# Patient Record
Sex: Female | Born: 1935 | Race: White | Hispanic: No | Marital: Married | Smoking: Former smoker
Health system: Southern US, Community
[De-identification: ages and names within clinical notes are randomized; demographics above are authoritative.]

## PROBLEM LIST (undated history)

## (undated) ENCOUNTER — Emergency Department (HOSPITAL_COMMUNITY): Admission: EM | Discharge status: Against Medical Advice | Payer: Medicare HMO

## (undated) DIAGNOSIS — I219 Acute myocardial infarction, unspecified: Secondary | ICD-10-CM

## (undated) DIAGNOSIS — Z9889 Other specified postprocedural states: Secondary | ICD-10-CM

## (undated) DIAGNOSIS — J189 Pneumonia, unspecified organism: Secondary | ICD-10-CM

## (undated) DIAGNOSIS — I10 Essential (primary) hypertension: Secondary | ICD-10-CM

## (undated) DIAGNOSIS — I951 Orthostatic hypotension: Secondary | ICD-10-CM

## (undated) DIAGNOSIS — E785 Hyperlipidemia, unspecified: Secondary | ICD-10-CM

## (undated) DIAGNOSIS — H509 Unspecified strabismus: Secondary | ICD-10-CM

## (undated) DIAGNOSIS — K551 Chronic vascular disorders of intestine: Secondary | ICD-10-CM

## (undated) DIAGNOSIS — D126 Benign neoplasm of colon, unspecified: Secondary | ICD-10-CM

## (undated) DIAGNOSIS — K317 Polyp of stomach and duodenum: Secondary | ICD-10-CM

## (undated) DIAGNOSIS — S72001A Fracture of unspecified part of neck of right femur, initial encounter for closed fracture: Secondary | ICD-10-CM

## (undated) DIAGNOSIS — D509 Iron deficiency anemia, unspecified: Secondary | ICD-10-CM

## (undated) DIAGNOSIS — K449 Diaphragmatic hernia without obstruction or gangrene: Secondary | ICD-10-CM

## (undated) DIAGNOSIS — K219 Gastro-esophageal reflux disease without esophagitis: Secondary | ICD-10-CM

## (undated) DIAGNOSIS — C3490 Malignant neoplasm of unspecified part of unspecified bronchus or lung: Secondary | ICD-10-CM

## (undated) DIAGNOSIS — H3530 Unspecified macular degeneration: Secondary | ICD-10-CM

## (undated) DIAGNOSIS — E039 Hypothyroidism, unspecified: Secondary | ICD-10-CM

## (undated) DIAGNOSIS — F32A Depression, unspecified: Secondary | ICD-10-CM

## (undated) DIAGNOSIS — J449 Chronic obstructive pulmonary disease, unspecified: Secondary | ICD-10-CM

## (undated) DIAGNOSIS — R413 Other amnesia: Secondary | ICD-10-CM

## (undated) DIAGNOSIS — G7000 Myasthenia gravis without (acute) exacerbation: Secondary | ICD-10-CM

## (undated) DIAGNOSIS — I2119 ST elevation (STEMI) myocardial infarction involving other coronary artery of inferior wall: Secondary | ICD-10-CM

## (undated) DIAGNOSIS — K2970 Gastritis, unspecified, without bleeding: Secondary | ICD-10-CM

## (undated) DIAGNOSIS — R112 Nausea with vomiting, unspecified: Secondary | ICD-10-CM

## (undated) DIAGNOSIS — M1990 Unspecified osteoarthritis, unspecified site: Secondary | ICD-10-CM

## (undated) DIAGNOSIS — I509 Heart failure, unspecified: Secondary | ICD-10-CM

## (undated) DIAGNOSIS — F329 Major depressive disorder, single episode, unspecified: Secondary | ICD-10-CM

## (undated) DIAGNOSIS — R55 Syncope and collapse: Secondary | ICD-10-CM

## (undated) DIAGNOSIS — IMO0001 Reserved for inherently not codable concepts without codable children: Secondary | ICD-10-CM

## (undated) DIAGNOSIS — K5790 Diverticulosis of intestine, part unspecified, without perforation or abscess without bleeding: Secondary | ICD-10-CM

## (undated) DIAGNOSIS — S129XXA Fracture of neck, unspecified, initial encounter: Secondary | ICD-10-CM

## (undated) DIAGNOSIS — I2510 Atherosclerotic heart disease of native coronary artery without angina pectoris: Secondary | ICD-10-CM

## (undated) DIAGNOSIS — I6529 Occlusion and stenosis of unspecified carotid artery: Secondary | ICD-10-CM

## (undated) HISTORY — DX: Polyp of stomach and duodenum: K31.7

## (undated) HISTORY — PX: OTHER SURGICAL HISTORY: SHX169

## (undated) HISTORY — DX: Fracture of unspecified part of neck of right femur, initial encounter for closed fracture: S72.001A

## (undated) HISTORY — DX: Orthostatic hypotension: I95.1

## (undated) HISTORY — DX: Benign neoplasm of colon, unspecified: D12.6

## (undated) HISTORY — DX: Occlusion and stenosis of unspecified carotid artery: I65.29

## (undated) HISTORY — DX: Diaphragmatic hernia without obstruction or gangrene: K44.9

## (undated) HISTORY — DX: Atherosclerotic heart disease of native coronary artery without angina pectoris: I25.10

## (undated) HISTORY — DX: Chronic vascular disorders of intestine: K55.1

## (undated) HISTORY — DX: Hyperlipidemia, unspecified: E78.5

## (undated) HISTORY — PX: LEG SURGERY: SHX1003

## (undated) HISTORY — DX: Myasthenia gravis without (acute) exacerbation: G70.00

## (undated) HISTORY — DX: Gastritis, unspecified, without bleeding: K29.70

## (undated) HISTORY — DX: Acute myocardial infarction, unspecified: I21.9

## (undated) HISTORY — DX: Essential (primary) hypertension: I10

## (undated) HISTORY — PX: FOOT SURGERY: SHX648

## (undated) HISTORY — PX: CAROTID ENDARTERECTOMY: SUR193

## (undated) HISTORY — PX: CATARACT EXTRACTION: SUR2

## (undated) HISTORY — DX: Hypothyroidism, unspecified: E03.9

## (undated) HISTORY — DX: Syncope and collapse: R55

## (undated) HISTORY — DX: Iron deficiency anemia, unspecified: D50.9

## (undated) HISTORY — DX: Unspecified macular degeneration: H35.30

## (undated) HISTORY — DX: Diverticulosis of intestine, part unspecified, without perforation or abscess without bleeding: K57.90

## (undated) HISTORY — DX: Fracture of neck, unspecified, initial encounter: S12.9XXA

## (undated) HISTORY — DX: Gastro-esophageal reflux disease without esophagitis: K21.9

## (undated) HISTORY — DX: Chronic obstructive pulmonary disease, unspecified: J44.9

## (undated) HISTORY — DX: Malignant neoplasm of unspecified part of unspecified bronchus or lung: C34.90

## (undated) HISTORY — DX: Other amnesia: R41.3

## (undated) HISTORY — PX: UPPER GI ENDOSCOPY: SHX6162

---

## 1968-07-17 HISTORY — PX: MIDDLE EAR SURGERY: SHX713

## 1971-07-18 HISTORY — PX: TOTAL ABDOMINAL HYSTERECTOMY: SHX209

## 1991-07-18 DIAGNOSIS — I219 Acute myocardial infarction, unspecified: Secondary | ICD-10-CM

## 1991-07-18 HISTORY — DX: Acute myocardial infarction, unspecified: I21.9

## 1991-07-18 HISTORY — PX: BALLOON ANGIOPLASTY, ARTERY: SHX564

## 1991-09-15 ENCOUNTER — Encounter: Payer: Self-pay | Admitting: Gastroenterology

## 1991-09-15 DIAGNOSIS — K2970 Gastritis, unspecified, without bleeding: Secondary | ICD-10-CM

## 1991-09-15 DIAGNOSIS — K449 Diaphragmatic hernia without obstruction or gangrene: Secondary | ICD-10-CM

## 1991-09-15 DIAGNOSIS — K219 Gastro-esophageal reflux disease without esophagitis: Secondary | ICD-10-CM

## 1991-09-15 HISTORY — DX: Gastro-esophageal reflux disease without esophagitis: K21.9

## 1991-09-15 HISTORY — DX: Gastritis, unspecified, without bleeding: K29.70

## 1991-09-15 HISTORY — DX: Diaphragmatic hernia without obstruction or gangrene: K44.9

## 1994-07-17 HISTORY — PX: CARDIAC CATHETERIZATION: SHX172

## 1996-07-17 DIAGNOSIS — R55 Syncope and collapse: Secondary | ICD-10-CM

## 1996-07-17 HISTORY — DX: Syncope and collapse: R55

## 1998-06-21 ENCOUNTER — Ambulatory Visit (HOSPITAL_COMMUNITY): Admission: RE | Admit: 1998-06-21 | Discharge: 1998-06-21 | Payer: Self-pay | Admitting: Family Medicine

## 1998-06-21 ENCOUNTER — Encounter: Payer: Self-pay | Admitting: Family Medicine

## 1998-09-13 ENCOUNTER — Other Ambulatory Visit: Admission: RE | Admit: 1998-09-13 | Discharge: 1998-09-13 | Payer: Self-pay | Admitting: *Deleted

## 1998-09-21 ENCOUNTER — Ambulatory Visit (HOSPITAL_COMMUNITY): Admission: RE | Admit: 1998-09-21 | Discharge: 1998-09-21 | Payer: Self-pay | Admitting: *Deleted

## 1998-09-21 ENCOUNTER — Encounter: Payer: Self-pay | Admitting: *Deleted

## 1999-08-10 ENCOUNTER — Other Ambulatory Visit: Admission: RE | Admit: 1999-08-10 | Discharge: 1999-08-10 | Payer: Self-pay | Admitting: *Deleted

## 1999-08-18 ENCOUNTER — Ambulatory Visit (HOSPITAL_COMMUNITY): Admission: RE | Admit: 1999-08-18 | Discharge: 1999-08-18 | Payer: Self-pay | Admitting: Otolaryngology

## 1999-08-18 ENCOUNTER — Encounter: Payer: Self-pay | Admitting: Otolaryngology

## 2000-06-04 ENCOUNTER — Other Ambulatory Visit: Admission: RE | Admit: 2000-06-04 | Discharge: 2000-06-04 | Payer: Self-pay | Admitting: Gastroenterology

## 2000-06-04 ENCOUNTER — Encounter (INDEPENDENT_AMBULATORY_CARE_PROVIDER_SITE_OTHER): Payer: Self-pay

## 2000-07-31 ENCOUNTER — Other Ambulatory Visit: Admission: RE | Admit: 2000-07-31 | Discharge: 2000-07-31 | Payer: Self-pay | Admitting: *Deleted

## 2001-07-17 HISTORY — PX: OTHER SURGICAL HISTORY: SHX169

## 2002-02-18 ENCOUNTER — Inpatient Hospital Stay (HOSPITAL_COMMUNITY): Admission: EM | Admit: 2002-02-18 | Discharge: 2002-02-20 | Payer: Self-pay

## 2002-03-24 ENCOUNTER — Encounter: Payer: Self-pay | Admitting: Emergency Medicine

## 2002-03-24 ENCOUNTER — Emergency Department (HOSPITAL_COMMUNITY): Admission: EM | Admit: 2002-03-24 | Discharge: 2002-03-24 | Payer: Self-pay | Admitting: Emergency Medicine

## 2003-05-05 ENCOUNTER — Other Ambulatory Visit: Admission: RE | Admit: 2003-05-05 | Discharge: 2003-05-05 | Payer: Self-pay | Admitting: Family Medicine

## 2003-05-06 ENCOUNTER — Encounter: Admission: RE | Admit: 2003-05-06 | Discharge: 2003-05-06 | Payer: Self-pay | Admitting: Internal Medicine

## 2003-05-06 ENCOUNTER — Encounter: Payer: Self-pay | Admitting: Internal Medicine

## 2003-10-23 ENCOUNTER — Encounter: Admission: RE | Admit: 2003-10-23 | Discharge: 2003-10-23 | Payer: Self-pay | Admitting: Internal Medicine

## 2004-06-21 ENCOUNTER — Ambulatory Visit: Payer: Self-pay | Admitting: Internal Medicine

## 2004-07-17 HISTORY — PX: COLONOSCOPY W/ POLYPECTOMY: SHX1380

## 2004-08-11 ENCOUNTER — Ambulatory Visit: Payer: Self-pay | Admitting: Family Medicine

## 2004-08-19 ENCOUNTER — Ambulatory Visit: Payer: Self-pay | Admitting: Internal Medicine

## 2004-08-22 ENCOUNTER — Ambulatory Visit: Payer: Self-pay | Admitting: Internal Medicine

## 2004-08-24 ENCOUNTER — Encounter: Admission: RE | Admit: 2004-08-24 | Discharge: 2004-08-24 | Payer: Self-pay | Admitting: Internal Medicine

## 2004-10-07 ENCOUNTER — Encounter: Admission: RE | Admit: 2004-10-07 | Discharge: 2004-10-07 | Payer: Self-pay | Admitting: Neurology

## 2004-11-04 ENCOUNTER — Ambulatory Visit: Payer: Self-pay | Admitting: Gastroenterology

## 2004-11-16 ENCOUNTER — Ambulatory Visit: Payer: Self-pay | Admitting: Internal Medicine

## 2004-11-21 ENCOUNTER — Ambulatory Visit: Payer: Self-pay | Admitting: Gastroenterology

## 2004-11-21 DIAGNOSIS — D126 Benign neoplasm of colon, unspecified: Secondary | ICD-10-CM | POA: Insufficient documentation

## 2005-01-19 ENCOUNTER — Ambulatory Visit: Payer: Self-pay | Admitting: Internal Medicine

## 2005-05-05 ENCOUNTER — Ambulatory Visit: Payer: Self-pay | Admitting: Internal Medicine

## 2005-06-19 ENCOUNTER — Ambulatory Visit: Payer: Self-pay | Admitting: Internal Medicine

## 2005-07-04 ENCOUNTER — Ambulatory Visit: Payer: Self-pay | Admitting: Internal Medicine

## 2005-10-31 ENCOUNTER — Ambulatory Visit: Payer: Self-pay | Admitting: Internal Medicine

## 2005-11-07 ENCOUNTER — Ambulatory Visit: Payer: Self-pay | Admitting: Internal Medicine

## 2005-11-21 ENCOUNTER — Ambulatory Visit: Payer: Self-pay | Admitting: Internal Medicine

## 2005-12-08 ENCOUNTER — Ambulatory Visit: Payer: Self-pay | Admitting: Internal Medicine

## 2006-02-14 ENCOUNTER — Ambulatory Visit: Payer: Self-pay | Admitting: Internal Medicine

## 2006-02-15 ENCOUNTER — Ambulatory Visit: Payer: Self-pay | Admitting: Internal Medicine

## 2006-03-20 ENCOUNTER — Encounter: Payer: Self-pay | Admitting: Family Medicine

## 2006-03-20 ENCOUNTER — Other Ambulatory Visit: Admission: RE | Admit: 2006-03-20 | Discharge: 2006-03-20 | Payer: Self-pay | Admitting: Family Medicine

## 2006-03-20 ENCOUNTER — Ambulatory Visit: Payer: Self-pay | Admitting: Family Medicine

## 2006-03-23 ENCOUNTER — Encounter: Admission: RE | Admit: 2006-03-23 | Discharge: 2006-03-23 | Payer: Self-pay | Admitting: Family Medicine

## 2006-03-30 ENCOUNTER — Ambulatory Visit: Payer: Self-pay | Admitting: Internal Medicine

## 2006-06-01 ENCOUNTER — Encounter: Admission: RE | Admit: 2006-06-01 | Discharge: 2006-06-01 | Payer: Self-pay | Admitting: Neurosurgery

## 2006-09-11 ENCOUNTER — Ambulatory Visit: Payer: Self-pay | Admitting: Internal Medicine

## 2006-09-11 LAB — CONVERTED CEMR LAB
BUN: 14 mg/dL (ref 6–23)
Basophils Absolute: 0.3 10*3/uL — ABNORMAL HIGH (ref 0.0–0.1)
Basophils Relative: 3 % — ABNORMAL HIGH (ref 0.0–1.0)
Creatinine, Ser: 1 mg/dL (ref 0.4–1.2)
Creatinine,U: 28.6 mg/dL
Eosinophils Absolute: 0 10*3/uL (ref 0.0–0.6)
Eosinophils Relative: 0.1 % (ref 0.0–5.0)
HCT: 35.7 % — ABNORMAL LOW (ref 36.0–46.0)
Hemoglobin: 12.2 g/dL (ref 12.0–15.0)
Hgb A1c MFr Bld: 6.2 % — ABNORMAL HIGH (ref 4.6–6.0)
Lymphocytes Relative: 15.3 % (ref 12.0–46.0)
MCHC: 34.2 g/dL (ref 30.0–36.0)
MCV: 84.9 fL (ref 78.0–100.0)
Microalb, Ur: 0.2 mg/dL (ref 0.0–1.9)
Monocytes Absolute: 0.1 10*3/uL — ABNORMAL LOW (ref 0.2–0.7)
Monocytes Relative: 1.2 % — ABNORMAL LOW (ref 3.0–11.0)
Neutro Abs: 7.1 10*3/uL (ref 1.4–7.7)
Neutrophils Relative %: 80.4 % — ABNORMAL HIGH (ref 43.0–77.0)
Platelets: 237 10*3/uL (ref 150–400)
Potassium: 4.4 meq/L (ref 3.5–5.1)
RBC: 4.2 M/uL (ref 3.87–5.11)
RDW: 13.4 % (ref 11.5–14.6)
TSH: 0.76 microintl units/mL (ref 0.35–5.50)
WBC: 8.9 10*3/uL (ref 4.5–10.5)

## 2006-09-28 ENCOUNTER — Ambulatory Visit: Payer: Self-pay | Admitting: Internal Medicine

## 2006-11-29 ENCOUNTER — Encounter: Payer: Self-pay | Admitting: Internal Medicine

## 2007-01-11 ENCOUNTER — Telehealth (INDEPENDENT_AMBULATORY_CARE_PROVIDER_SITE_OTHER): Payer: Self-pay | Admitting: *Deleted

## 2007-01-11 ENCOUNTER — Encounter: Payer: Self-pay | Admitting: Internal Medicine

## 2007-01-11 ENCOUNTER — Encounter: Admission: RE | Admit: 2007-01-11 | Discharge: 2007-01-11 | Payer: Self-pay | Admitting: Family Medicine

## 2007-01-11 ENCOUNTER — Ambulatory Visit: Payer: Self-pay | Admitting: Family Medicine

## 2007-01-14 ENCOUNTER — Encounter (INDEPENDENT_AMBULATORY_CARE_PROVIDER_SITE_OTHER): Payer: Self-pay | Admitting: *Deleted

## 2007-01-16 ENCOUNTER — Telehealth (INDEPENDENT_AMBULATORY_CARE_PROVIDER_SITE_OTHER): Payer: Self-pay | Admitting: *Deleted

## 2007-01-29 ENCOUNTER — Ambulatory Visit: Payer: Self-pay | Admitting: Internal Medicine

## 2007-01-29 LAB — CONVERTED CEMR LAB
ALT: 16 units/L (ref 0–35)
AST: 24 units/L (ref 0–37)
BUN: 9 mg/dL (ref 6–23)
Cholesterol: 194 mg/dL (ref 0–200)
Creatinine, Ser: 0.8 mg/dL (ref 0.4–1.2)
HDL: 74.4 mg/dL (ref >39.0)
Hgb A1c MFr Bld: 6.1 % — ABNORMAL HIGH (ref 4.6–6.0)
LDL Cholesterol: 103 mg/dL — ABNORMAL HIGH (ref 0–99)
Potassium: 4.5 meq/L (ref 3.5–5.1)
TSH: 7.02 microintl units/mL — ABNORMAL HIGH (ref 0.35–5.50)
Total CHOL/HDL Ratio: 2.6
Triglycerides: 83 mg/dL (ref 0–149)
VLDL: 17 mg/dL (ref 0–40)

## 2007-02-06 ENCOUNTER — Ambulatory Visit: Payer: Self-pay | Admitting: Internal Medicine

## 2007-02-06 DIAGNOSIS — I10 Essential (primary) hypertension: Secondary | ICD-10-CM | POA: Insufficient documentation

## 2007-02-06 DIAGNOSIS — F17200 Nicotine dependence, unspecified, uncomplicated: Secondary | ICD-10-CM | POA: Insufficient documentation

## 2007-02-06 LAB — CONVERTED CEMR LAB
Cholesterol, target level: 200 mg/dL
HDL goal, serum: 40 mg/dL
LDL Goal: 100 mg/dL

## 2007-02-14 ENCOUNTER — Encounter: Payer: Self-pay | Admitting: Internal Medicine

## 2007-02-15 ENCOUNTER — Encounter: Payer: Self-pay | Admitting: Internal Medicine

## 2007-04-22 ENCOUNTER — Telehealth (INDEPENDENT_AMBULATORY_CARE_PROVIDER_SITE_OTHER): Payer: Self-pay | Admitting: *Deleted

## 2007-04-23 ENCOUNTER — Ambulatory Visit: Payer: Self-pay | Admitting: Internal Medicine

## 2007-04-28 ENCOUNTER — Emergency Department (HOSPITAL_COMMUNITY): Admission: EM | Admit: 2007-04-28 | Discharge: 2007-04-28 | Payer: Self-pay | Admitting: Emergency Medicine

## 2007-05-29 ENCOUNTER — Telehealth (INDEPENDENT_AMBULATORY_CARE_PROVIDER_SITE_OTHER): Payer: Self-pay | Admitting: *Deleted

## 2007-08-13 ENCOUNTER — Ambulatory Visit: Payer: Self-pay | Admitting: Internal Medicine

## 2007-08-18 LAB — CONVERTED CEMR LAB
ALT: 15 units/L (ref 0–35)
AST: 22 units/L (ref 0–37)
BUN: 14 mg/dL (ref 6–23)
Cholesterol: 203 mg/dL (ref 0–200)
Creatinine, Ser: 1 mg/dL (ref 0.4–1.2)
Direct LDL: 98.7 mg/dL
HDL: 86.3 mg/dL (ref >39.0)
Hgb A1c MFr Bld: 6.1 % — ABNORMAL HIGH (ref 4.6–6.0)
Total CHOL/HDL Ratio: 2.4
Triglycerides: 84 mg/dL (ref 0–149)
VLDL: 17 mg/dL (ref 0–40)

## 2007-08-19 ENCOUNTER — Ambulatory Visit: Payer: Self-pay | Admitting: Internal Medicine

## 2007-08-19 DIAGNOSIS — J439 Emphysema, unspecified: Secondary | ICD-10-CM | POA: Insufficient documentation

## 2007-08-19 DIAGNOSIS — E782 Mixed hyperlipidemia: Secondary | ICD-10-CM | POA: Insufficient documentation

## 2007-08-19 DIAGNOSIS — R7309 Other abnormal glucose: Secondary | ICD-10-CM | POA: Insufficient documentation

## 2007-08-21 ENCOUNTER — Encounter (INDEPENDENT_AMBULATORY_CARE_PROVIDER_SITE_OTHER): Payer: Self-pay | Admitting: *Deleted

## 2007-08-26 ENCOUNTER — Ambulatory Visit: Payer: Self-pay | Admitting: Cardiology

## 2007-09-06 ENCOUNTER — Ambulatory Visit: Payer: Self-pay

## 2007-10-09 ENCOUNTER — Encounter: Payer: Self-pay | Admitting: Internal Medicine

## 2007-10-23 ENCOUNTER — Telehealth (INDEPENDENT_AMBULATORY_CARE_PROVIDER_SITE_OTHER): Payer: Self-pay | Admitting: *Deleted

## 2007-11-06 ENCOUNTER — Encounter: Admission: RE | Admit: 2007-11-06 | Discharge: 2007-11-06 | Payer: Self-pay | Admitting: Neurology

## 2007-11-15 DIAGNOSIS — K317 Polyp of stomach and duodenum: Secondary | ICD-10-CM

## 2007-11-15 HISTORY — DX: Polyp of stomach and duodenum: K31.7

## 2007-12-02 ENCOUNTER — Telehealth (INDEPENDENT_AMBULATORY_CARE_PROVIDER_SITE_OTHER): Payer: Self-pay | Admitting: *Deleted

## 2007-12-02 ENCOUNTER — Emergency Department (HOSPITAL_COMMUNITY): Admission: EM | Admit: 2007-12-02 | Discharge: 2007-12-02 | Payer: Self-pay | Admitting: Emergency Medicine

## 2007-12-04 ENCOUNTER — Ambulatory Visit: Payer: Self-pay | Admitting: Internal Medicine

## 2007-12-04 LAB — CONVERTED CEMR LAB
Basophils Absolute: 0.1 10*3/uL (ref 0.0–0.1)
Basophils Relative: 0.8 % (ref 0.0–1.0)
Eosinophils Absolute: 0.1 10*3/uL (ref 0.0–0.7)
Eosinophils Relative: 1 % (ref 0.0–5.0)
Folate: 14.3 ng/mL
HCT: 33.7 % — ABNORMAL LOW (ref 36.0–46.0)
Hemoglobin: 11.1 g/dL — ABNORMAL LOW (ref 12.0–15.0)
Hgb A1c MFr Bld: 5.9 % (ref 4.6–6.0)
Iron: 27 ug/dL — ABNORMAL LOW (ref 42–145)
Lymphocytes Relative: 14.6 % (ref 12.0–46.0)
MCHC: 32.9 g/dL (ref 30.0–36.0)
MCV: 79.9 fL (ref 78.0–100.0)
Monocytes Absolute: 0.3 10*3/uL (ref 0.1–1.0)
Monocytes Relative: 4.7 % (ref 3.0–12.0)
Neutro Abs: 5.6 10*3/uL (ref 1.4–7.7)
Neutrophils Relative %: 78.9 % — ABNORMAL HIGH (ref 43.0–77.0)
Platelets: 225 10*3/uL (ref 150–400)
RBC: 4.22 M/uL (ref 3.87–5.11)
RDW: 14.6 % (ref 11.5–14.6)
Saturation Ratios: 7 % — ABNORMAL LOW (ref 20.0–50.0)
Transferrin: 275.4 mg/dL (ref >=212.0)
Vitamin B-12: 1113 pg/mL — ABNORMAL HIGH (ref 211–911)
WBC: 7.1 10*3/uL (ref 4.5–10.5)

## 2007-12-05 ENCOUNTER — Encounter: Payer: Self-pay | Admitting: Internal Medicine

## 2007-12-05 DIAGNOSIS — K5730 Diverticulosis of large intestine without perforation or abscess without bleeding: Secondary | ICD-10-CM | POA: Insufficient documentation

## 2007-12-06 ENCOUNTER — Encounter (INDEPENDENT_AMBULATORY_CARE_PROVIDER_SITE_OTHER): Payer: Self-pay | Admitting: *Deleted

## 2007-12-06 ENCOUNTER — Ambulatory Visit: Payer: Self-pay | Admitting: Gastroenterology

## 2007-12-06 LAB — CONVERTED CEMR LAB
CA 125: 9.8 units/mL (ref 0.0–30.2)
CRP, High Sensitivity: <1 — ABNORMAL LOW (ref 0.00–5.00)

## 2007-12-07 ENCOUNTER — Telehealth: Payer: Self-pay | Admitting: Internal Medicine

## 2007-12-10 ENCOUNTER — Ambulatory Visit: Payer: Self-pay | Admitting: Gastroenterology

## 2007-12-10 LAB — CONVERTED CEMR LAB
CRP, High Sensitivity: <1 — ABNORMAL LOW (ref 0.00–5.00)
Ferritin: 5.8 ng/mL — ABNORMAL LOW (ref 10.0–291.0)
Folate: 15.1 ng/mL
Iron: 26 ug/dL — ABNORMAL LOW (ref 42–145)
Saturation Ratios: 6.8 % — ABNORMAL LOW (ref 20.0–50.0)
Sed Rate: 17 mm/hr (ref 0–22)
Transferrin: 273.6 mg/dL (ref >=212.0)
Vitamin B-12: 968 pg/mL — ABNORMAL HIGH (ref 211–911)

## 2007-12-11 ENCOUNTER — Ambulatory Visit: Payer: Self-pay | Admitting: Gastroenterology

## 2007-12-11 ENCOUNTER — Encounter: Payer: Self-pay | Admitting: Gastroenterology

## 2007-12-11 LAB — HM COLONOSCOPY

## 2007-12-12 ENCOUNTER — Telehealth: Payer: Self-pay | Admitting: Gastroenterology

## 2007-12-13 ENCOUNTER — Encounter: Payer: Self-pay | Admitting: Gastroenterology

## 2007-12-17 ENCOUNTER — Telehealth: Payer: Self-pay | Admitting: Gastroenterology

## 2008-01-21 ENCOUNTER — Telehealth (INDEPENDENT_AMBULATORY_CARE_PROVIDER_SITE_OTHER): Payer: Self-pay | Admitting: *Deleted

## 2008-02-04 ENCOUNTER — Encounter: Payer: Self-pay | Admitting: Internal Medicine

## 2008-02-24 ENCOUNTER — Telehealth (INDEPENDENT_AMBULATORY_CARE_PROVIDER_SITE_OTHER): Payer: Self-pay | Admitting: *Deleted

## 2008-03-03 ENCOUNTER — Telehealth (INDEPENDENT_AMBULATORY_CARE_PROVIDER_SITE_OTHER): Payer: Self-pay | Admitting: *Deleted

## 2008-03-09 ENCOUNTER — Telehealth (INDEPENDENT_AMBULATORY_CARE_PROVIDER_SITE_OTHER): Payer: Self-pay | Admitting: *Deleted

## 2008-03-10 ENCOUNTER — Telehealth (INDEPENDENT_AMBULATORY_CARE_PROVIDER_SITE_OTHER): Payer: Self-pay | Admitting: *Deleted

## 2008-03-13 ENCOUNTER — Ambulatory Visit: Payer: Self-pay | Admitting: Internal Medicine

## 2008-03-13 ENCOUNTER — Encounter: Payer: Self-pay | Admitting: Internal Medicine

## 2008-03-13 DIAGNOSIS — M25569 Pain in unspecified knee: Secondary | ICD-10-CM | POA: Insufficient documentation

## 2008-03-16 ENCOUNTER — Telehealth: Payer: Self-pay | Admitting: Internal Medicine

## 2008-04-13 ENCOUNTER — Encounter: Payer: Self-pay | Admitting: Internal Medicine

## 2008-04-13 ENCOUNTER — Telehealth (INDEPENDENT_AMBULATORY_CARE_PROVIDER_SITE_OTHER): Payer: Self-pay | Admitting: *Deleted

## 2008-04-15 ENCOUNTER — Encounter: Admission: RE | Admit: 2008-04-15 | Discharge: 2008-04-15 | Payer: Self-pay | Admitting: Neurology

## 2008-04-17 ENCOUNTER — Ambulatory Visit: Payer: Self-pay | Admitting: Family Medicine

## 2008-04-17 ENCOUNTER — Encounter: Payer: Self-pay | Admitting: Internal Medicine

## 2008-04-20 ENCOUNTER — Encounter: Admission: RE | Admit: 2008-04-20 | Discharge: 2008-04-20 | Payer: Self-pay | Admitting: Neurology

## 2008-04-23 ENCOUNTER — Encounter: Payer: Self-pay | Admitting: Internal Medicine

## 2008-05-07 ENCOUNTER — Telehealth (INDEPENDENT_AMBULATORY_CARE_PROVIDER_SITE_OTHER): Payer: Self-pay | Admitting: *Deleted

## 2008-05-12 ENCOUNTER — Encounter (INDEPENDENT_AMBULATORY_CARE_PROVIDER_SITE_OTHER): Payer: Self-pay | Admitting: *Deleted

## 2008-05-12 ENCOUNTER — Ambulatory Visit: Payer: Self-pay | Admitting: Internal Medicine

## 2008-05-12 DIAGNOSIS — M8580 Other specified disorders of bone density and structure, unspecified site: Secondary | ICD-10-CM | POA: Insufficient documentation

## 2008-05-13 LAB — CONVERTED CEMR LAB: Vit D, 1,25-Dihydroxy: 10 — ABNORMAL LOW (ref 30–89)

## 2008-05-14 ENCOUNTER — Encounter (INDEPENDENT_AMBULATORY_CARE_PROVIDER_SITE_OTHER): Payer: Self-pay | Admitting: *Deleted

## 2008-05-14 ENCOUNTER — Telehealth (INDEPENDENT_AMBULATORY_CARE_PROVIDER_SITE_OTHER): Payer: Self-pay | Admitting: *Deleted

## 2008-05-26 ENCOUNTER — Ambulatory Visit: Payer: Self-pay | Admitting: Internal Medicine

## 2008-05-26 DIAGNOSIS — E559 Vitamin D deficiency, unspecified: Secondary | ICD-10-CM | POA: Insufficient documentation

## 2008-05-26 DIAGNOSIS — E039 Hypothyroidism, unspecified: Secondary | ICD-10-CM | POA: Insufficient documentation

## 2008-06-05 ENCOUNTER — Emergency Department (HOSPITAL_COMMUNITY): Admission: EM | Admit: 2008-06-05 | Discharge: 2008-06-05 | Payer: Self-pay | Admitting: Emergency Medicine

## 2008-06-16 ENCOUNTER — Encounter: Payer: Self-pay | Admitting: Internal Medicine

## 2008-06-17 ENCOUNTER — Encounter (INDEPENDENT_AMBULATORY_CARE_PROVIDER_SITE_OTHER): Payer: Self-pay | Admitting: *Deleted

## 2008-06-17 LAB — CONVERTED CEMR LAB
BUN: 10 mg/dL (ref 6–23)
Creatinine, Ser: 0.7 mg/dL (ref 0.4–1.2)
Hgb A1c MFr Bld: 5.9 % (ref 4.6–6.0)
Potassium: 4.5 meq/L (ref 3.5–5.1)
TSH: 0.55 microintl units/mL (ref 0.35–5.50)

## 2008-06-18 ENCOUNTER — Telehealth (INDEPENDENT_AMBULATORY_CARE_PROVIDER_SITE_OTHER): Payer: Self-pay | Admitting: *Deleted

## 2008-07-29 ENCOUNTER — Telehealth (INDEPENDENT_AMBULATORY_CARE_PROVIDER_SITE_OTHER): Payer: Self-pay | Admitting: *Deleted

## 2008-08-31 ENCOUNTER — Ambulatory Visit: Payer: Self-pay | Admitting: Cardiology

## 2008-09-05 DIAGNOSIS — I2510 Atherosclerotic heart disease of native coronary artery without angina pectoris: Secondary | ICD-10-CM | POA: Insufficient documentation

## 2008-09-07 ENCOUNTER — Ambulatory Visit: Payer: Self-pay | Admitting: Internal Medicine

## 2008-09-07 LAB — CONVERTED CEMR LAB
ALT: 13 units/L (ref 0–35)
AST: 23 units/L (ref 0–37)
Albumin: 3.5 g/dL (ref 3.5–5.2)
Alkaline Phosphatase: 57 units/L (ref 39–117)
BUN: 12 mg/dL (ref 6–23)
Basophils Absolute: 0.1 10*3/uL (ref 0.0–0.1)
Basophils Relative: 1.5 % (ref 0.0–3.0)
Bilirubin, Direct: 0.1 mg/dL (ref 0.0–0.3)
CO2: 30 meq/L (ref 19–32)
Calcium: 8.9 mg/dL (ref 8.4–10.5)
Chloride: 105 meq/L (ref 96–112)
Cholesterol: 179 mg/dL (ref 0–200)
Creatinine, Ser: 0.8 mg/dL (ref 0.4–1.2)
Eosinophils Absolute: 0.3 10*3/uL (ref 0.0–0.7)
Eosinophils Relative: 6 % — ABNORMAL HIGH (ref 0.0–5.0)
GFR calc Af Amer: 90 mL/min
GFR calc non Af Amer: 75 mL/min
Glucose, Bld: 85 mg/dL (ref 70–99)
HCT: 32.5 % — ABNORMAL LOW (ref 36.0–46.0)
HDL: 69.9 mg/dL (ref >39.0)
Hemoglobin: 10.4 g/dL — ABNORMAL LOW (ref 12.0–15.0)
LDL Cholesterol: 94 mg/dL (ref 0–99)
Lymphocytes Relative: 37.7 % (ref 12.0–46.0)
MCHC: 32.1 g/dL (ref 30.0–36.0)
MCV: 76.8 fL — ABNORMAL LOW (ref 78.0–100.0)
Monocytes Absolute: 0.5 10*3/uL (ref 0.1–1.0)
Monocytes Relative: 11 % (ref 3.0–12.0)
Neutro Abs: 2 10*3/uL (ref 1.4–7.7)
Neutrophils Relative %: 43.8 % (ref 43.0–77.0)
Platelets: 190 10*3/uL (ref 150–400)
Potassium: 4.3 meq/L (ref 3.5–5.1)
RBC: 4.23 M/uL (ref 3.87–5.11)
RDW: 15 % — ABNORMAL HIGH (ref 11.5–14.6)
Sodium: 142 meq/L (ref 135–145)
Total Bilirubin: 0.7 mg/dL (ref 0.3–1.2)
Total CHOL/HDL Ratio: 2.6
Total Protein: 6.5 g/dL (ref 6.0–8.3)
Triglycerides: 78 mg/dL (ref 0–149)
VLDL: 16 mg/dL (ref 0–40)
Vit D, 25-Hydroxy: 50 ng/mL (ref 30–89)
WBC: 4.7 10*3/uL (ref 4.5–10.5)

## 2008-09-11 ENCOUNTER — Ambulatory Visit: Payer: Self-pay | Admitting: Internal Medicine

## 2008-09-11 ENCOUNTER — Encounter (INDEPENDENT_AMBULATORY_CARE_PROVIDER_SITE_OTHER): Payer: Self-pay | Admitting: *Deleted

## 2008-09-11 DIAGNOSIS — D509 Iron deficiency anemia, unspecified: Secondary | ICD-10-CM | POA: Insufficient documentation

## 2008-09-11 LAB — CONVERTED CEMR LAB
OCCULT 1: NEGATIVE
OCCULT 2: NEGATIVE
OCCULT 3: NEGATIVE

## 2008-09-14 LAB — CONVERTED CEMR LAB
Hgb A1c MFr Bld: 6 % (ref 4.6–6.0)
Iron: 24 ug/dL — ABNORMAL LOW (ref 42–145)
Saturation Ratios: 5.9 % — ABNORMAL LOW (ref 20.0–50.0)
Transferrin: 292.6 mg/dL (ref 212.0–360.0)

## 2008-09-16 ENCOUNTER — Encounter (INDEPENDENT_AMBULATORY_CARE_PROVIDER_SITE_OTHER): Payer: Self-pay | Admitting: *Deleted

## 2008-09-16 ENCOUNTER — Telehealth (INDEPENDENT_AMBULATORY_CARE_PROVIDER_SITE_OTHER): Payer: Self-pay | Admitting: *Deleted

## 2008-09-21 ENCOUNTER — Telehealth: Payer: Self-pay | Admitting: Internal Medicine

## 2008-09-25 ENCOUNTER — Encounter: Payer: Self-pay | Admitting: Internal Medicine

## 2008-11-18 ENCOUNTER — Telehealth (INDEPENDENT_AMBULATORY_CARE_PROVIDER_SITE_OTHER): Payer: Self-pay | Admitting: *Deleted

## 2008-11-19 ENCOUNTER — Encounter: Payer: Self-pay | Admitting: Internal Medicine

## 2009-01-07 ENCOUNTER — Inpatient Hospital Stay (HOSPITAL_COMMUNITY): Admission: EM | Admit: 2009-01-07 | Discharge: 2009-01-10 | Payer: Self-pay | Admitting: Emergency Medicine

## 2009-01-07 ENCOUNTER — Ambulatory Visit: Payer: Self-pay | Admitting: Internal Medicine

## 2009-01-07 ENCOUNTER — Ambulatory Visit: Payer: Self-pay | Admitting: Cardiovascular Disease

## 2009-01-08 ENCOUNTER — Encounter: Payer: Self-pay | Admitting: Cardiovascular Disease

## 2009-01-14 ENCOUNTER — Encounter: Payer: Self-pay | Admitting: Internal Medicine

## 2009-01-29 ENCOUNTER — Telehealth: Payer: Self-pay | Admitting: Cardiology

## 2009-02-08 ENCOUNTER — Ambulatory Visit: Payer: Self-pay | Admitting: Internal Medicine

## 2009-02-15 ENCOUNTER — Ambulatory Visit: Payer: Self-pay | Admitting: Internal Medicine

## 2009-02-19 ENCOUNTER — Encounter (INDEPENDENT_AMBULATORY_CARE_PROVIDER_SITE_OTHER): Payer: Self-pay | Admitting: *Deleted

## 2009-02-19 LAB — CONVERTED CEMR LAB
Basophils Absolute: 0 10*3/uL (ref 0.0–0.1)
Basophils Relative: 0.8 % (ref 0.0–3.0)
Eosinophils Absolute: 0.2 10*3/uL (ref 0.0–0.7)
Eosinophils Relative: 3.4 % (ref 0.0–5.0)
HCT: 34.6 % — ABNORMAL LOW (ref 36.0–46.0)
Hemoglobin: 11.7 g/dL — ABNORMAL LOW (ref 12.0–15.0)
Iron: 66 ug/dL (ref 42–145)
Lymphocytes Relative: 32.7 % (ref 12.0–46.0)
Lymphs Abs: 1.7 10*3/uL (ref 0.7–4.0)
MCHC: 33.9 g/dL (ref 30.0–36.0)
MCV: 86.5 fL (ref 78.0–100.0)
Monocytes Absolute: 0.7 10*3/uL (ref 0.1–1.0)
Monocytes Relative: 13.2 % — ABNORMAL HIGH (ref 3.0–12.0)
Neutro Abs: 2.5 10*3/uL (ref 1.4–7.7)
Neutrophils Relative %: 49.9 % (ref 43.0–77.0)
Platelets: 180 10*3/uL (ref 150.0–400.0)
RBC: 4 M/uL (ref 3.87–5.11)
RDW: 13.1 % (ref 11.5–14.6)
Saturation Ratios: 18.3 % — ABNORMAL LOW (ref 20.0–50.0)
Transferrin: 258.3 mg/dL (ref 212.0–360.0)
WBC: 5.1 10*3/uL (ref 4.5–10.5)

## 2009-03-01 ENCOUNTER — Encounter: Admission: RE | Admit: 2009-03-01 | Discharge: 2009-03-01 | Payer: Self-pay | Admitting: Neurosurgery

## 2009-03-08 ENCOUNTER — Encounter: Payer: Self-pay | Admitting: Internal Medicine

## 2009-03-25 ENCOUNTER — Telehealth (INDEPENDENT_AMBULATORY_CARE_PROVIDER_SITE_OTHER): Payer: Self-pay | Admitting: *Deleted

## 2009-03-30 ENCOUNTER — Telehealth (INDEPENDENT_AMBULATORY_CARE_PROVIDER_SITE_OTHER): Payer: Self-pay | Admitting: *Deleted

## 2009-04-13 ENCOUNTER — Encounter: Payer: Self-pay | Admitting: Internal Medicine

## 2009-04-20 ENCOUNTER — Telehealth (INDEPENDENT_AMBULATORY_CARE_PROVIDER_SITE_OTHER): Payer: Self-pay | Admitting: *Deleted

## 2009-04-22 ENCOUNTER — Encounter: Admission: RE | Admit: 2009-04-22 | Discharge: 2009-04-22 | Payer: Self-pay | Admitting: Neurosurgery

## 2009-04-26 ENCOUNTER — Encounter: Payer: Self-pay | Admitting: Internal Medicine

## 2009-04-29 ENCOUNTER — Encounter: Payer: Self-pay | Admitting: Internal Medicine

## 2009-05-17 ENCOUNTER — Telehealth (INDEPENDENT_AMBULATORY_CARE_PROVIDER_SITE_OTHER): Payer: Self-pay | Admitting: *Deleted

## 2009-06-03 ENCOUNTER — Encounter (INDEPENDENT_AMBULATORY_CARE_PROVIDER_SITE_OTHER): Payer: Self-pay | Admitting: *Deleted

## 2009-06-25 ENCOUNTER — Telehealth (INDEPENDENT_AMBULATORY_CARE_PROVIDER_SITE_OTHER): Payer: Self-pay | Admitting: *Deleted

## 2009-07-20 ENCOUNTER — Telehealth (INDEPENDENT_AMBULATORY_CARE_PROVIDER_SITE_OTHER): Payer: Self-pay | Admitting: *Deleted

## 2009-07-21 ENCOUNTER — Telehealth (INDEPENDENT_AMBULATORY_CARE_PROVIDER_SITE_OTHER): Payer: Self-pay | Admitting: *Deleted

## 2009-08-16 ENCOUNTER — Telehealth (INDEPENDENT_AMBULATORY_CARE_PROVIDER_SITE_OTHER): Payer: Self-pay | Admitting: *Deleted

## 2009-08-17 ENCOUNTER — Ambulatory Visit: Payer: Self-pay | Admitting: Cardiology

## 2009-08-17 DIAGNOSIS — R0989 Other specified symptoms and signs involving the circulatory and respiratory systems: Secondary | ICD-10-CM | POA: Insufficient documentation

## 2009-08-17 DIAGNOSIS — I6529 Occlusion and stenosis of unspecified carotid artery: Secondary | ICD-10-CM | POA: Insufficient documentation

## 2009-08-19 ENCOUNTER — Ambulatory Visit: Payer: Self-pay | Admitting: Internal Medicine

## 2009-09-13 ENCOUNTER — Encounter: Payer: Self-pay | Admitting: Internal Medicine

## 2009-09-15 ENCOUNTER — Ambulatory Visit: Payer: Self-pay | Admitting: Cardiology

## 2009-09-24 ENCOUNTER — Ambulatory Visit: Payer: Self-pay

## 2009-09-24 ENCOUNTER — Encounter: Payer: Self-pay | Admitting: Cardiology

## 2009-09-29 ENCOUNTER — Telehealth: Payer: Self-pay | Admitting: Cardiology

## 2009-10-04 ENCOUNTER — Telehealth (INDEPENDENT_AMBULATORY_CARE_PROVIDER_SITE_OTHER): Payer: Self-pay | Admitting: *Deleted

## 2009-10-18 ENCOUNTER — Ambulatory Visit: Payer: Self-pay | Admitting: Internal Medicine

## 2009-10-18 DIAGNOSIS — H0520 Unspecified exophthalmos: Secondary | ICD-10-CM | POA: Insufficient documentation

## 2009-10-18 DIAGNOSIS — R3919 Other difficulties with micturition: Secondary | ICD-10-CM | POA: Insufficient documentation

## 2009-10-18 LAB — CONVERTED CEMR LAB
Bilirubin Urine: NEGATIVE
Glucose, Urine, Semiquant: NEGATIVE
Ketones, urine, test strip: NEGATIVE
Nitrite: POSITIVE
Protein, U semiquant: NEGATIVE
Specific Gravity, Urine: 1.015
Urobilinogen, UA: 0.2
pH: 6.5

## 2009-10-19 ENCOUNTER — Encounter: Payer: Self-pay | Admitting: Internal Medicine

## 2009-10-19 LAB — CONVERTED CEMR LAB
Basophils Absolute: 0 10*3/uL (ref 0.0–0.1)
Basophils Relative: 0.8 % (ref 0.0–3.0)
Eosinophils Absolute: 0.1 10*3/uL (ref 0.0–0.7)
Eosinophils Relative: 2.5 % (ref 0.0–5.0)
Folate: 14.7 ng/mL
Free T4: 1.2 ng/dL (ref 0.6–1.6)
HCT: 35.3 % — ABNORMAL LOW (ref 36.0–46.0)
Hemoglobin: 12 g/dL (ref 12.0–15.0)
Hgb A1c MFr Bld: 6.1 % (ref 4.6–6.5)
Iron: 38 ug/dL — ABNORMAL LOW (ref 42–145)
Lymphocytes Relative: 30.5 % (ref 12.0–46.0)
Lymphs Abs: 1.8 10*3/uL (ref 0.7–4.0)
MCHC: 33.9 g/dL (ref 30.0–36.0)
MCV: 83.1 fL (ref 78.0–100.0)
Monocytes Absolute: 0.6 10*3/uL (ref 0.1–1.0)
Monocytes Relative: 10.9 % (ref 3.0–12.0)
Neutro Abs: 3.2 10*3/uL (ref 1.4–7.7)
Neutrophils Relative %: 55.3 % (ref 43.0–77.0)
Platelets: 196 10*3/uL (ref 150.0–400.0)
RBC: 4.24 M/uL (ref 3.87–5.11)
RDW: 14.8 % — ABNORMAL HIGH (ref 11.5–14.6)
Saturation Ratios: 9.4 % — ABNORMAL LOW (ref 20.0–50.0)
T3, Free: 2.4 pg/mL (ref 2.3–4.2)
TSH: 0.28 microintl units/mL — ABNORMAL LOW (ref 0.35–5.50)
Transferrin: 289.4 mg/dL (ref 212.0–360.0)
Vitamin B-12: 375 pg/mL (ref 211–911)
WBC: 5.8 10*3/uL (ref 4.5–10.5)

## 2009-10-20 LAB — CONVERTED CEMR LAB: Vit D, 25-Hydroxy: 46 ng/mL (ref 30–89)

## 2009-10-21 ENCOUNTER — Telehealth (INDEPENDENT_AMBULATORY_CARE_PROVIDER_SITE_OTHER): Payer: Self-pay | Admitting: *Deleted

## 2009-10-22 ENCOUNTER — Telehealth (INDEPENDENT_AMBULATORY_CARE_PROVIDER_SITE_OTHER): Payer: Self-pay | Admitting: *Deleted

## 2009-11-02 ENCOUNTER — Ambulatory Visit: Payer: Self-pay | Admitting: Internal Medicine

## 2009-11-02 ENCOUNTER — Encounter (INDEPENDENT_AMBULATORY_CARE_PROVIDER_SITE_OTHER): Payer: Self-pay | Admitting: *Deleted

## 2009-11-02 LAB — CONVERTED CEMR LAB
OCCULT 1: NEGATIVE
OCCULT 2: NEGATIVE
OCCULT 3: NEGATIVE

## 2009-11-22 ENCOUNTER — Telehealth (INDEPENDENT_AMBULATORY_CARE_PROVIDER_SITE_OTHER): Payer: Self-pay | Admitting: *Deleted

## 2009-12-16 ENCOUNTER — Telehealth (INDEPENDENT_AMBULATORY_CARE_PROVIDER_SITE_OTHER): Payer: Self-pay | Admitting: *Deleted

## 2009-12-16 ENCOUNTER — Ambulatory Visit: Payer: Self-pay | Admitting: Internal Medicine

## 2009-12-22 LAB — CONVERTED CEMR LAB: TSH: 1.29 microintl units/mL (ref 0.35–5.50)

## 2010-01-03 ENCOUNTER — Telehealth (INDEPENDENT_AMBULATORY_CARE_PROVIDER_SITE_OTHER): Payer: Self-pay | Admitting: *Deleted

## 2010-01-07 ENCOUNTER — Ambulatory Visit: Payer: Self-pay | Admitting: Internal Medicine

## 2010-01-10 LAB — CONVERTED CEMR LAB: Vit D, 25-Hydroxy: 58 ng/mL (ref 30–89)

## 2010-01-11 LAB — CONVERTED CEMR LAB
ALT: 12 units/L (ref 0–35)
AST: 23 units/L (ref 0–37)
Albumin: 3.7 g/dL (ref 3.5–5.2)
Alkaline Phosphatase: 66 units/L (ref 39–117)
BUN: 11 mg/dL (ref 6–23)
Basophils Absolute: 0 10*3/uL (ref 0.0–0.1)
Basophils Relative: 0.7 % (ref 0.0–3.0)
Bilirubin, Direct: 0.1 mg/dL (ref 0.0–0.3)
CO2: 30 meq/L (ref 19–32)
Calcium: 8.8 mg/dL (ref 8.4–10.5)
Chloride: 109 meq/L (ref 96–112)
Cholesterol: 173 mg/dL (ref 0–200)
Creatinine, Ser: 0.8 mg/dL (ref 0.4–1.2)
Eosinophils Absolute: 0.3 10*3/uL (ref 0.0–0.7)
Eosinophils Relative: 5 % (ref 0.0–5.0)
GFR calc non Af Amer: 80.19 mL/min (ref >60)
Glucose, Bld: 94 mg/dL (ref 70–99)
HCT: 32.5 % — ABNORMAL LOW (ref 36.0–46.0)
HDL: 69.5 mg/dL (ref >39.00)
Hemoglobin: 11 g/dL — ABNORMAL LOW (ref 12.0–15.0)
Hgb A1c MFr Bld: 6.1 % (ref 4.6–6.5)
LDL Cholesterol: 88 mg/dL (ref 0–99)
Lymphocytes Relative: 36 % (ref 12.0–46.0)
Lymphs Abs: 2 10*3/uL (ref 0.7–4.0)
MCHC: 33.9 g/dL (ref 30.0–36.0)
MCV: 83.4 fL (ref 78.0–100.0)
Monocytes Absolute: 0.5 10*3/uL (ref 0.1–1.0)
Monocytes Relative: 9.9 % (ref 3.0–12.0)
Neutro Abs: 2.7 10*3/uL (ref 1.4–7.7)
Neutrophils Relative %: 48.4 % (ref 43.0–77.0)
Platelets: 182 10*3/uL (ref 150.0–400.0)
Potassium: 4.7 meq/L (ref 3.5–5.1)
RBC: 3.89 M/uL (ref 3.87–5.11)
RDW: 15.6 % — ABNORMAL HIGH (ref 11.5–14.6)
Sodium: 143 meq/L (ref 135–145)
Total Bilirubin: 0.6 mg/dL (ref 0.3–1.2)
Total CHOL/HDL Ratio: 2
Total Protein: 6.4 g/dL (ref 6.0–8.3)
Triglycerides: 80 mg/dL (ref 0.0–149.0)
VLDL: 16 mg/dL (ref 0.0–40.0)
WBC: 5.5 10*3/uL (ref 4.5–10.5)

## 2010-01-20 ENCOUNTER — Ambulatory Visit: Payer: Self-pay | Admitting: Internal Medicine

## 2010-01-24 ENCOUNTER — Telehealth (INDEPENDENT_AMBULATORY_CARE_PROVIDER_SITE_OTHER): Payer: Self-pay | Admitting: *Deleted

## 2010-01-24 LAB — CONVERTED CEMR LAB
Basophils Absolute: 0.1 10*3/uL (ref 0.0–0.1)
Basophils Relative: 0.9 % (ref 0.0–3.0)
Eosinophils Absolute: 0.1 10*3/uL (ref 0.0–0.7)
Eosinophils Relative: 1.3 % (ref 0.0–5.0)
Folate: 15.5 ng/mL
HCT: 33.3 % — ABNORMAL LOW (ref 36.0–46.0)
Hemoglobin: 11.3 g/dL — ABNORMAL LOW (ref 12.0–15.0)
Iron: 44 ug/dL (ref 42–145)
Lymphocytes Relative: 36.9 % (ref 12.0–46.0)
Lymphs Abs: 2 10*3/uL (ref 0.7–4.0)
MCHC: 34 g/dL (ref 30.0–36.0)
MCV: 83.7 fL (ref 78.0–100.0)
Monocytes Absolute: 0.6 10*3/uL (ref 0.1–1.0)
Monocytes Relative: 10.3 % (ref 3.0–12.0)
Neutro Abs: 2.8 10*3/uL (ref 1.4–7.7)
Neutrophils Relative %: 50.6 % (ref 43.0–77.0)
Platelets: 185 10*3/uL (ref 150.0–400.0)
RBC: 3.97 M/uL (ref 3.87–5.11)
RDW: 14.8 % — ABNORMAL HIGH (ref 11.5–14.6)
Saturation Ratios: 10.3 % — ABNORMAL LOW (ref 20.0–50.0)
Transferrin: 304.3 mg/dL (ref 212.0–360.0)
Vitamin B-12: 553 pg/mL (ref 211–911)
WBC: 5.5 10*3/uL (ref 4.5–10.5)

## 2010-02-15 ENCOUNTER — Ambulatory Visit: Payer: Self-pay | Admitting: Internal Medicine

## 2010-02-21 LAB — CONVERTED CEMR LAB
Free T4: 1.13 ng/dL (ref 0.60–1.60)
T3 Uptake Ratio: 40.5 % — ABNORMAL HIGH (ref 22.5–37.0)
T3, Free: 2.1 pg/mL — ABNORMAL LOW (ref 2.3–4.2)
T3, Total: 80.2 ng/dL (ref 80.0–204.0)
TSH: 1.18 microintl units/mL (ref 0.35–5.50)

## 2010-03-22 ENCOUNTER — Telehealth (INDEPENDENT_AMBULATORY_CARE_PROVIDER_SITE_OTHER): Payer: Self-pay | Admitting: *Deleted

## 2010-04-19 ENCOUNTER — Telehealth (INDEPENDENT_AMBULATORY_CARE_PROVIDER_SITE_OTHER): Payer: Self-pay | Admitting: *Deleted

## 2010-05-23 ENCOUNTER — Telehealth: Payer: Self-pay | Admitting: Internal Medicine

## 2010-06-20 ENCOUNTER — Telehealth: Payer: Self-pay | Admitting: Internal Medicine

## 2010-06-21 ENCOUNTER — Telehealth (INDEPENDENT_AMBULATORY_CARE_PROVIDER_SITE_OTHER): Payer: Self-pay | Admitting: *Deleted

## 2010-06-22 ENCOUNTER — Telehealth: Payer: Self-pay | Admitting: Internal Medicine

## 2010-07-19 ENCOUNTER — Telehealth: Payer: Self-pay | Admitting: Internal Medicine

## 2010-07-22 ENCOUNTER — Ambulatory Visit
Admission: RE | Admit: 2010-07-22 | Discharge: 2010-07-22 | Payer: Self-pay | Source: Home / Self Care | Attending: Internal Medicine | Admitting: Internal Medicine

## 2010-07-22 ENCOUNTER — Other Ambulatory Visit: Payer: Self-pay | Admitting: Internal Medicine

## 2010-07-22 ENCOUNTER — Encounter: Payer: Self-pay | Admitting: Cardiology

## 2010-07-22 DIAGNOSIS — D649 Anemia, unspecified: Secondary | ICD-10-CM | POA: Insufficient documentation

## 2010-07-22 LAB — LIPID PANEL
Cholesterol: 183 mg/dL (ref 0–200)
HDL: 76.7 mg/dL (ref >39.00)
LDL Cholesterol: 91 mg/dL (ref 0–99)
Total CHOL/HDL Ratio: 2
Triglycerides: 79 mg/dL (ref 0.0–149.0)
VLDL: 15.8 mg/dL (ref 0.0–40.0)

## 2010-07-22 LAB — CBC WITH DIFFERENTIAL/PLATELET
Basophils Absolute: 0 10*3/uL (ref 0.0–0.1)
Basophils Relative: 0.8 % (ref 0.0–3.0)
Eosinophils Absolute: 0.2 10*3/uL (ref 0.0–0.7)
Eosinophils Relative: 3.2 % (ref 0.0–5.0)
HCT: 34.6 % — ABNORMAL LOW (ref 36.0–46.0)
Hemoglobin: 11.4 g/dL — ABNORMAL LOW (ref 12.0–15.0)
Lymphocytes Relative: 28.2 % (ref 12.0–46.0)
Lymphs Abs: 1.6 10*3/uL (ref 0.7–4.0)
MCHC: 33 g/dL (ref 30.0–36.0)
MCV: 79.6 fl (ref 78.0–100.0)
Monocytes Absolute: 0.6 10*3/uL (ref 0.1–1.0)
Monocytes Relative: 10.6 % (ref 3.0–12.0)
Neutro Abs: 3.2 10*3/uL (ref 1.4–7.7)
Neutrophils Relative %: 57.2 % (ref 43.0–77.0)
Platelets: 205 10*3/uL (ref 150.0–400.0)
RBC: 4.35 Mil/uL (ref 3.87–5.11)
RDW: 16 % — ABNORMAL HIGH (ref 11.5–14.6)
WBC: 5.6 10*3/uL (ref 4.5–10.5)

## 2010-07-22 LAB — T3, FREE: T3, Free: 2.2 pg/mL — ABNORMAL LOW (ref 2.3–4.2)

## 2010-07-22 LAB — CONVERTED CEMR LAB
Bilirubin Urine: NEGATIVE
Blood in Urine, dipstick: NEGATIVE
Glucose, Urine, Semiquant: NEGATIVE
Ketones, urine, test strip: NEGATIVE
Nitrite: NEGATIVE
Protein, U semiquant: NEGATIVE
Specific Gravity, Urine: <1.005
Urobilinogen, UA: NEGATIVE
WBC Urine, dipstick: NEGATIVE
pH: 6

## 2010-07-22 LAB — HEPATIC FUNCTION PANEL
ALT: 15 U/L (ref 0–35)
AST: 26 U/L (ref 0–37)
Albumin: 3.6 g/dL (ref 3.5–5.2)
Alkaline Phosphatase: 74 U/L (ref 39–117)
Bilirubin, Direct: 0.1 mg/dL (ref 0.0–0.3)
Total Bilirubin: 0.5 mg/dL (ref 0.3–1.2)
Total Protein: 6.4 g/dL (ref 6.0–8.3)

## 2010-07-22 LAB — BASIC METABOLIC PANEL
BUN: 13 mg/dL (ref 6–23)
CO2: 26 mEq/L (ref 19–32)
Calcium: 8.8 mg/dL (ref 8.4–10.5)
Chloride: 105 mEq/L (ref 96–112)
Creatinine, Ser: 0.9 mg/dL (ref 0.4–1.2)
GFR: 62.47 mL/min (ref >60.00)
Glucose, Bld: 87 mg/dL (ref 70–99)
Potassium: 5 mEq/L (ref 3.5–5.1)
Sodium: 140 mEq/L (ref 135–145)

## 2010-07-22 LAB — HEMOGLOBIN A1C: Hgb A1c MFr Bld: 6.2 % (ref 4.6–6.5)

## 2010-07-22 LAB — T4, FREE: Free T4: 1.04 ng/dL (ref 0.60–1.60)

## 2010-07-22 LAB — TSH: TSH: 2.46 u[IU]/mL (ref 0.35–5.50)

## 2010-07-25 ENCOUNTER — Ambulatory Visit
Admission: RE | Admit: 2010-07-25 | Discharge: 2010-07-25 | Payer: Self-pay | Source: Home / Self Care | Attending: Cardiology | Admitting: Cardiology

## 2010-07-25 ENCOUNTER — Encounter: Payer: Self-pay | Admitting: Cardiology

## 2010-08-04 ENCOUNTER — Encounter: Payer: Self-pay | Admitting: Cardiology

## 2010-08-04 ENCOUNTER — Ambulatory Visit: Admission: RE | Admit: 2010-08-04 | Discharge: 2010-08-04 | Payer: Self-pay | Source: Home / Self Care

## 2010-08-07 ENCOUNTER — Encounter: Payer: Self-pay | Admitting: Family Medicine

## 2010-08-08 ENCOUNTER — Encounter: Payer: Self-pay | Admitting: Neurosurgery

## 2010-08-08 ENCOUNTER — Encounter: Payer: Self-pay | Admitting: Otolaryngology

## 2010-08-12 ENCOUNTER — Telehealth: Payer: Self-pay | Admitting: Cardiology

## 2010-08-14 LAB — CONVERTED CEMR LAB
Basophils Absolute: 0.1 10*3/uL (ref 0.0–0.1)
Basophils Relative: 1 % (ref 0.0–3.0)
Eosinophils Absolute: 0.2 10*3/uL (ref 0.0–0.7)
Eosinophils Relative: 3.1 % (ref 0.0–5.0)
Free T4: 1.2 ng/dL (ref 0.6–1.6)
HCT: 35.1 % — ABNORMAL LOW (ref 36.0–46.0)
Hemoglobin: 11.5 g/dL — ABNORMAL LOW (ref 12.0–15.0)
Iron: 40 ug/dL — ABNORMAL LOW (ref 42–145)
LDL Goal: 70 mg/dL
Lymphocytes Relative: 25.3 % (ref 12.0–46.0)
Lymphs Abs: 1.8 10*3/uL (ref 0.7–4.0)
MCHC: 32.8 g/dL (ref 30.0–36.0)
MCV: 87.4 fL (ref 78.0–100.0)
Monocytes Absolute: 0.7 10*3/uL (ref 0.1–1.0)
Monocytes Relative: 10.3 % (ref 3.0–12.0)
Neutro Abs: 4.3 10*3/uL (ref 1.4–7.7)
Neutrophils Relative %: 60.3 % (ref 43.0–77.0)
Platelets: 180 10*3/uL (ref 150.0–400.0)
RBC: 4.02 M/uL (ref 3.87–5.11)
RDW: 13.5 % (ref 11.5–14.6)
Saturation Ratios: 11.3 % — ABNORMAL LOW (ref 20.0–50.0)
T3 Uptake Ratio: 40.5 % — ABNORMAL HIGH (ref 22.5–37.0)
T3, Total: 84.7 ng/dL (ref 80.0–204.0)
T4, Total: 9.8 ug/dL (ref 5.0–12.5)
TSH: 0.5 microintl units/mL (ref 0.35–5.50)
Transferrin: 253.5 mg/dL (ref 212.0–360.0)
WBC: 7.1 10*3/uL (ref 4.5–10.5)

## 2010-08-16 ENCOUNTER — Telehealth: Payer: Self-pay | Admitting: Internal Medicine

## 2010-08-16 NOTE — Letter (Signed)
Summary: Results Follow up Letter  [place] at [facility]  [address] [place], [place] [address]   Phone: [phone]  Fax: [phone]    06/17/2008 MRN: [id number]  [facility] [address] [place], [place]  [address]  Dear Ms. Ordoyne,  The following are the results of your recent test(s):  Test         Result    Pap Smear:        Normal _____  Not Normal _____ Comments: ______________________________________________________ Cholesterol: LDL(Bad cholesterol):         Your goal is less than:         HDL (Good cholesterol):       Your goal is more than: Comments:  ______________________________________________________ Mammogram:        Normal _____  Not Normal _____ Comments:  ___________________________________________________________________ Hemoccult:        Normal _____  Not normal _______ Comments:    _____________________________________________________________________ Other Tests: PLEASE SEE ATTACHED LABS FROM 05/26/08- Excellent results; please share with all physicians you see    We routinely do not discuss normal results over the telephone.  If you desire a copy of the results, or you have any questions about this information we can discuss them at your next office visit.   Sincerely,

## 2010-08-16 NOTE — Letter (Signed)
Summary: Labs w/comment per Dr. Jearld Adjutant  Labs w/comment per Dr. Jearld Adjutant   Imported By: Freddy Jaksch 12/06/2007 11:41:12  _____________________________________________________________________  External Attachment:    Type:   Image     Comment:   External Document

## 2010-08-16 NOTE — Letter (Signed)
Summary: [facility]  [facility]   Imported By: Lanelle Bal 05/04/2009 09:55:24  _____________________________________________________________________  External Attachment:    Type:   Image     Comment:   External Document

## 2010-08-16 NOTE — Letter (Signed)
Summary: External Correspondence--E N T  External Correspondence--E N T   Imported By: Freddy Jaksch 12/28/2006 11:33:35  _____________________________________________________________________  External Attachment:    Type:   Image     Comment:   [facility] E N T

## 2010-08-16 NOTE — Progress Notes (Signed)
Summary: Returned Actuary of Call: I called the patient and notified her that her [employer] prescriptions were sent back to [place] stating that she was not in their system. She is aware that her coverage does not start until Jan 2012 and will call back at that time to have prescriptions sent to [facility]. Initial call taken by: Lucious Groves CMA,  June 22, 2010 8:39 AM
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
## 2010-08-16 NOTE — Miscellaneous (Signed)
Summary: Plan of Care/[facility]  Plan of Care/[facility]   Imported By: Lanelle Bal 02/15/2009 09:16:37  _____________________________________________________________________  External Attachment:    Type:   Image     Comment:   External Document

## 2010-08-16 NOTE — Assessment & Plan Note (Signed)
Summary: abd pain ..em  Medications Added ASPIRIN 81 MG  TBEC (ASPIRIN) once daily * CALCIUM AND VIT D once daily        History of Present Illness Visit Type: consult Primary GI MD: Sheryn Bison MD FACP FAGA Primary Provider: Dr. Marga Melnick, M.D.   Chief Complaint: abdominal pain in lower abdomen, also some nausea no vomiting History of Present Illness:   This patient is elderly white female muscle many years ago per family history of colon cancer in her brother and colonoscopy which showed adenomatous polyps and diverticulosis. She has constipation rebound or irritable bowel syndrome is followed by Dr. Lona Kettle. She is referred today for 3 weeks of abdominal bloating and diffuse discomfort within a minute postprandial crampy left lower quadrant pain. When this began she had one episode of syncope, nausea vomiting, and had several days of loose stools without rectal bleeding or melena. She has ultrasound of the upper abdomen performed which showed cholelithiasis but otherwise was normal. Screening lab data was normal except for a mild anemia with hematocrit of 32 and an MCV of 79.  Patient continues with distention, gas, bloating, and mild nausea.She denies fever, chills, or other systemic complaints. He does have some mild chronic indigestion and acid reflux and last had an endoscopy in 2001. She's had no known episodes of known biliary colic, pancreatitis, or hepatitis. She denies recent antibiotic exposure or NSAIDs or alcohol use.   GI Review of Systems    Reports abdominal pain, acid reflux, bloating, loss of appetite, and  nausea.     Location of  Abdominal pain: diffuse.     Reports change in bowel habits, constipation, diarrhea, diverticulosis, and  fecal incontinence.     Denies black tarry stools, heme positive stool, rectal bleeding, and  rectal pain.     Prior Medications Reviewed Using: List Brought by Patient  Updated Prior Medication List: LISINOPRIL 40 MG  TABS (LISINOPRIL) Take one tablet daily SERTRALINE HCL 50 MG TABS (SERTRALINE HCL) 1 by mouth qd LEVOTHYROXINE SODIUM 112 MCG TABS (LEVOTHYROXINE SODIUM) 1 tab daily except 1/2 tab tues &sat PREDNISONE 10 MG TABS (PREDNISONE) once daily ASPIRIN 81 MG  TBEC (ASPIRIN) once daily METFORMIN HCL 500 MG TABS (METFORMIN HCL) 1 by mouth qd CLONAZEPAM 1 MG TABS (CLONAZEPAM) as needed sleep * CALCIUM AND VIT D once daily * VIT B12 TAB  PRAVACHOL 40 MG  TABS (PRAVASTATIN SODIUM) 1 qhs LORAZEPAM 1 MG  TABS (LORAZEPAM) 1 by mouth two times a day-tid prn GLIMEPIRIDE 2 MG  TABS (GLIMEPIRIDE) 1/2 tab qam * CENTURY SILVER qd NITROGLYCERIN 0.3 MG  SUBL (NITROGLYCERIN) 1 SL prn ASCENSIA BREEZE 2   DISK (GLUCOSE BLOOD) Use as directed once daily  Current Allergies (reviewed today): ! SILVADENE  Past Medical History:    cigarette smoker    Diabetes mellitus, type II    Hyperlipidemia    Hypertension    Hyperthyroidism    chest pain, atypical    myasthenia gravis followed by Dr. Anne Hahn in neurology.   Family History:    Father: MI    Mother: thyroid disease, ?CA    Siblings: COAD; colon CA    Family History of Colon Cancer:youngest brother    Family History of Colon Polyps:brother    Family History of Diabetes: father    Family History of Heart Disease: father  Social History:    Former Smoker quit 02/2002    Patient currently smokes. 5-6 ciggerettes per day    Alcohol  Use - no    Daily Caffeine Use: 6 cups per day    Illicit Drug Use - no    Patient does not get regular exercise.    Risk Factors:  Tobacco use:  current Drug use:  no Alcohol use:  no Exercise:  no    Vital Signs:  Patient Profile:   77 Years Old Female Height:     65.5 inches Weight:      131.50 pounds BMI:     21.63 Pulse rate:   78 / minute Pulse rhythm:   regular BP sitting:   140 / 88  (left arm)  Vitals Entered By: Merri Ray CMA (Dec 06, 2007 10:08 AM)                  Physical  Exam  General:     Well developed, well nourished, no acute distress. Lungs:     Clear throughout to auscultation. Heart:     Regular rate and rhythm; no murmurs, rubs,  or bruits. Abdomen:     Soft, nontender and nondistended. No masses, hepatosplenomegaly or hernias noted. Normal bowel sounds. Rectal:     Normal exam.hemocult negative.   Msk:     Symmetrical with no gross deformities. Normal posture. Extremities:     No clubbing, cyanosis, edema or deformities noted. Neurologic:     Alert and  oriented x4;  grossly normal neurologically.    Impression & Recommendations:  Problem # 1:  ABDOMINAL PAIN-GENERALIZED (ICD-789.07) Assessment: New Her abdominal pain seems to be generalized and associated with gas, bloating, and constipation. She may have had an episode of subacute diverticulitis with resultant constipation. I'm concerned however about her anemia and low MCV, history of colon polyps, and positive family history of colon cancer in her brother. Of scheduler for colonoscopy and endoscopy at her convenience. We have started Benefiber 1 tablespoon with her cereal in the morning. A center by the lab to check an anemia profile, sed rate, and CRP.  Problem # 2:  UNSPECIFIED ANEMIA (ICD-285.9) Assessment: Deteriorated  Orders: TLB-CRP-Full Range (C-Reactive Protein) (86140-FCRP) TLB-Sedimentation Rate (ESR) (85651-ESR) T- * Misc. Laboratory test [phone]) TLB-Ferritin (82728-FER) TLB-IBC Pnl (Iron/FE;Transferrin) (83550-IBC) TLB-Folic Acid (Folate) (82746-FOL) TLB-B12, Serum-Total ONLY (60454-U98)   Problem # 3:  SYNCOPE (ICD-780.2) Assessment: Unchanged she is followed scheduled with Dr. Alwyn Ren and her neurologist. As mentioned above she has myasthenia gravis but I doubt this has any role in her gut problems.  Problem # 4:  CHEST PAIN (ICD-786.50) Assessment: Deteriorated I suspect this patient is having acid reflux and I placed her on kapidex 60 mg a day along with  standard antireflux maneuvers.  Problem # 5:  CHOLELITHIASIS (ICD-574.2) Assessment: New I doubt she is having symptomatic cholelithiasis and her liver function tests are normal. She may need CT scan of her abdomen depend on her endoscopic results and her clinical course.  Problem # 7:  DIABETES MELLITUS, TYPE II, CONTROLLED (ICD-250.00) Assessment: Unchanged we will make adjustments in her diabetic medications appropriate for colonoscopy and endoscopy exams. Orders: TLB-CRP-Full Range (C-Reactive Protein) (86140-FCRP) TLB-Sedimentation Rate (ESR) (85651-ESR) T- * Misc. Laboratory test [phone]) TLB-Ferritin (82728-FER) TLB-IBC Pnl (Iron/FE;Transferrin) (83550-IBC) TLB-Folic Acid (Folate) (82746-FOL) TLB-B12, Serum-Total ONLY (78295-A21)      ]  Appended Document: Orders Update    Clinical Lists Changes  Orders: Added new Test order of Colon/Endo (Colon/Endo) - Signed

## 2010-08-16 NOTE — Progress Notes (Signed)
Summary: needs prep rx  Medications Added MOVIPREP 100 GM  SOLR (PEG-KCL-NACL-NASULF-NA ASC-C) use as directed for colonoscopy prep       Phone Note Call from Patient   Caller: Patient Call For: on-call md Summary of Call: pharmacy did not get MoviPrep Rx Will Rx it Initial call taken by: Iva Boop MD,  Dec 07, 2007 3:49 PM    New/Updated Medications: MOVIPREP 100 GM  SOLR (PEG-KCL-NACL-NASULF-NA ASC-C) use as directed for colonoscopy prep   Prescriptions: MOVIPREP 100 GM  SOLR (PEG-KCL-NACL-NASULF-NA ASC-C) use as directed for colonoscopy prep  #0 x 0   Entered and Authorized by:   Iva Boop MD   Signed by:   Iva Boop MD on 12/07/2007   Method used:   [facility] to ...       [facility]  [address] [id number]       [address]       [place], [place]  [address]       Ph: [phone] or [phone]       Fax: [phone]   RxID:   [id number]     Appended Document: needs prep rx    Clinical Lists Changes  Medications: Rx of MOVIPREP 100 GM  SOLR (PEG-KCL-NACL-NASULF-NA ASC-C) use as directed for colonoscopy prep;  #1 x 0;  Signed;  Entered by: Francee Piccolo CMA;  Authorized by: Mardella Layman MD FACG,FAGA;  Method used: Electronic    Prescriptions: MOVIPREP 100 GM  SOLR (PEG-KCL-NACL-NASULF-NA ASC-C) use as directed for colonoscopy prep  #1 x 0   Entered by:   Francee Piccolo CMA   Authorized by:   Mardella Layman MD [facility]   Signed by:   Francee Piccolo CMA on 12/10/2007   Method used:   Electronically sent to ...       [facility]  [address] [id number]       [address]       [place], [place]  [address]       Ph: [phone] or [phone]       Fax: [phone]   RxID:   [id number]

## 2010-08-16 NOTE — Letter (Signed)
Summary: Patient Notice-Hyperplastic Polyps  [facility]  [address] [place], [place] [address]   Phone: [phone]  Fax: [phone]        Dec 13, 2007 MRN: [id number]    [facility] [address] [place], [place]  [address]    Dear Brandi Raymond,  I am pleased to inform you that the colon polyp(s) removed during your recent colonoscopy was (were) found to be hyperplastic.  These types of polyps are NOT pre-cancerous.  It is therefore my recommendation that you have a repeat colonoscopy examination in 10_ years for routine colorectal cancer screening.  Should you develop new or worsening symptoms of abdominal pain, bowel habit changes or bleeding from the rectum or bowels, please schedule an evaluation with either your primary care physician or with me.  Additional information/recommendations:  __No further action with gastroenterology is needed at this time.      Please follow-up with your primary care physician for your other      healthcare needs. __Please call [phone] to schedule a return visit to review      your situation.  __Please keep your follow-up visit as already scheduled.  _x_Continue treatment plan as outlined the day of your exam.  Please call us if you are having persistent problems or have questions about your condition that have not been fully answered at this time.  Sincerely,  Mardella Layman MD [facility]  This letter has been electronically signed by your physician.

## 2010-08-16 NOTE — Progress Notes (Signed)
Summary: refill  Phone Note Refill Request Message from:  Fax from Pharmacy on June 21, 2010 8:19 AM  Refills Requested: Medication #1:  CLONAZEPAM 1 MG TABS every 8 -12 hrs as needed only; can be habit forming [facility] [address] - fax [phone]   Follow-up for Phone Call        Filled on 05/24/10 # 90, Dr.Hopper please advise if ok to fill Follow-up by: Shonna Chock CMA,  June 21, 2010 10:54 AM  Additional Follow-up for Phone Call Additional follow up Details #1::        OK but this must be taken as needed ,NOT on regular schedule because of balance , sedation, habituation risks Additional Follow-up by: Marga Melnick MD,  June 21, 2010 1:09 PM    New/Updated Medications: CLONAZEPAM 1 MG TABS (CLONAZEPAM) every 8 -12 hrs as needed only; NOT on regular schedule because of balance , sedation, habituation risks Prescriptions: CLONAZEPAM 1 MG TABS (CLONAZEPAM) every 8 -12 hrs as needed only; NOT on regular schedule because of balance , sedation, habituation risks  #90 x 0   Entered by:   Shonna Chock CMA   Authorized by:   Marga Melnick MD   Signed by:   Shonna Chock CMA on 06/21/2010   Method used:   Printed then [facility]ed to ...       [facility]  [address] [id number]       [address]       [place], [place]  [address]       Ph: [phone] or [phone]       Fax: [phone]   RxID:   [id number]

## 2010-08-16 NOTE — Letter (Signed)
Summary: Patient [facility] Biopsy Results  [facility]  [address] [place], [place] [address]   Phone: [phone]  Fax: [phone]        Dec 13, 2007 MRN: [id number]    [facility] [address] [place], [place]  [address]    Dear Brandi Raymond,  I am pleased to inform you that the biopsies taken during your recent endoscopic examination did not show any evidence of cancer upon pathologic examination.  Additional information/recommendations:  __No further action is needed at this time.  Please follow-up with      your primary care physician for your other healthcare needs.  __ Please call [phone] to schedule a return visit to review      your condition.  __xx Continue with the treatment plan as outlined on the day of your      exam.  __ You should have a repeat endoscopic examination for thi_s problem              in _ months/years.   Please call us if you are having persistent problems or have questions about your condition that have not been fully answered at this time.  Sincerely,  Mardella Layman MD [facility]  This letter has been electronically signed by your physician.

## 2010-08-16 NOTE — Progress Notes (Signed)
Summary: hop--refill  Phone Note Refill Request   Refills Requested: Medication #1:  LORAZEPAM 1 MG  TABS 1 by mouth two times a day-tid prn [facility] [address][phone]  Initial call taken by: Freddy Jaksch,  March 10, 2008 9:56 AM  Follow-up for Phone Call        done 03/09/08 .Kandice Hams  March 10, 2008 12:41 PM  Follow-up by: Kandice Hams,  March 10, 2008 12:41 PM

## 2010-08-16 NOTE — Progress Notes (Signed)
  Phone Note Other Incoming   Call placed by: Ardyth Man,  January 11, 2007 1:36 PM Call from: [facility] Summary of Call: Pt. has no active lung disease and ribs are negative, per Dr. Laury Axon pt. is allowed to leave and we will call her when we get the report. Marylene Land Initial call taken by: Ardyth Man,  January 11, 2007 1:37 PM

## 2010-08-16 NOTE — Letter (Signed)
Summary: [facility]  [facility]   Imported By: Lanelle Bal 07/01/2008 10:18:14  _____________________________________________________________________  External Attachment:    Type:   Image     Comment:   External Document

## 2010-08-16 NOTE — Letter (Signed)
Summary: Bone Density Report/[facility]  Bone Density Report/[facility]   Imported By: Esmeralda Links D'jimraou 05/04/2008 12:13:10  _____________________________________________________________________  External Attachment:    Type:   Image     Comment:   External Document  Appended Document: Bone Density Report/[facility] Significant thinning present please see me with ALL meds & supplements(calcium,vitamin D,etc) to discuss options

## 2010-08-16 NOTE — Progress Notes (Signed)
Summary: result of xray-dr lowne  Phone Note Call from Patient   Caller: Patient Summary of Call: pt wants to know result of x-ray  Initial call taken by: Okey Regal Spring,  January 16, 2007 9:39 AM  Follow-up for Phone Call        PATIENT SAID SHE RECEIVED REPORTS IN MAIL AFTER LEAVING MESSAGE. PT SAID SINCE X-RAY REPORT(S) NORMAL, NOW WHAT? SHE IS STILL WITH PAIN Follow-up by: Shonna Chock,  January 16, 2007 2:04 PM  Additional Follow-up for Phone Call Additional follow up Details #1::        Will order bone scan of ribs-- pt should f/u with ov 1 week after scan done. Additional Follow-up by: Loreen Freud DO,  January 17, 2007 7:49 AM   Additional Follow-up for Phone Call Additional follow up Details #2::    SPOKE WITH PATIENT

## 2010-08-16 NOTE — Progress Notes (Signed)
Summary: RX  Phone Note Call from Patient Call back at [facility] Phone [phone]   Caller: Patient Summary of Call: PT CALLED AND LEFT MSG SHE IS OUT OF HER SERTRALINE AND PHARMACY HAS TOLD HER THEY HAVE REQUESTED, CALLED [facility] [address] AND INFORMED THEM WE HAVE NO REQUEST FOR THIS MED GAVE A VERBAL RX CALLED PT TO INFORM THEY ARE GETTING RX READY Initial call taken by: Kandice Hams,  March 25, 2009 10:00 AM    Prescriptions: SERTRALINE HCL 50 MG TABS (SERTRALINE HCL) 1 by mouth qd  #30 Tablet x 5   Entered by:   Kandice Hams   Authorized by:   Marga Melnick MD   Signed by:   Kandice Hams on 03/25/2009   Method used:   Telephoned to ...       [facility]  [address] [id number]       [address]       [place], [place]  [address]       Ph: [phone] or [phone]       Fax: [phone]   RxID:   [id number]

## 2010-08-16 NOTE — Progress Notes (Signed)
Summary: Refill Requests  Phone Note Refill Request Call back at [phone] Message from:  Pharmacy on Nov 22, 2009 8:35 AM  Refills Requested: Medication #1:  LORAZEPAM 1 MG  TABS 1 by mouth two times a day-tid prn   Dosage confirmed as above?Dosage Confirmed   Supply Requested: 1 month   Last Refilled: 10/30/2009  Medication #2:  CLONAZEPAM 1 MG TABS as needed sleep   Dosage confirmed as above?Dosage Confirmed   Supply Requested: 1 month   [facility] [address]   Next Appointment Scheduled: 6.3.11 (lab) Initial call taken by: Harold Barban,  Nov 22, 2009 8:38 AM    Prescriptions: LORAZEPAM 1 MG  TABS (LORAZEPAM) 1 by mouth two times a day-tid prn  #60 x 1   Entered by:   Shonna Chock   Authorized by:   Marga Melnick MD   Signed by:   Shonna Chock on 11/22/2009   Method used:   Printed then faxed to ...       [facility]  [address] [id number]       [address]       [place], [place]  [address]       Ph: [phone] or [phone]       Fax: [phone]   RxID:   [id number] CLONAZEPAM 1 MG TABS (CLONAZEPAM) as needed sleep  #30 x 1   Entered by:   Shonna Chock   Authorized by:   Marga Melnick MD   Signed by:   Shonna Chock on 11/22/2009   Method used:   Printed then faxed to ...       [facility]  [address] [id number]       [address]       [place], [place]  [address]       Ph: [phone] or [phone]       Fax: [phone]   RxID:   [id number]

## 2010-08-16 NOTE — Assessment & Plan Note (Signed)
Summary: dizzy.cbs   Vital Signs:  Patient Profile:   77 Years Old Female Weight:      136.50 pounds Temp:     97.0 degrees F oral Pulse rate:   64 / minute Resp:     16 per minute BP sitting:   120 / 76  (right arm)  Pt. in pain?   no  Vitals Entered By: Ardyth Man (Dec 04, 2007 10:28 AM)                  PCP:  Laury Axon       Chief Complaint:  Hospital follow up and Abdominal pain.  History of Present Illness:  Evaluated 12/02/07  for  syncope , ER record reviewed: HCT was 32.1, CT of head NAD. [place] of abd "normal". Also been having "stomache ache " for > 2 weeks & pain LLQ for several days.GI deferred colonoscopy until 5/10.  Abdominal Pain      This is a 77 year old woman who presents with Abdominal pain.  loose stool 5-6X/day X 2 weeks.  The patient reports nausea, but denies vomiting, diarrhea, constipation, melena, hematochezia, anorexia, and hematemesis.  The location of the pain is right lower quadrant and left lower quadrant.  The pain is described as intermittent and burning in quality.  The patient denies the following symptoms: fever, weight loss, dysuria, chest pain, jaundice, dark urine, and vaginal bleeding.  The pain is worse with food and lying down.      Current Allergies: ! SILVADENE  Past Surgical History:    1993 MI & angioplasty    Colonoscopy 05/2000    [facility] SENT TO [facility] 2003    colonoscopy polyps adenoma 11/2004    cataract    hysterectomy dysfunctional menses; ? ovaries remain    foot surgery     Review of Systems  CV      Denies palpitations.      No cardiac trigger pre syncope. Extensive Cardiac evaluation by Dr Antoine Poche( treadmill)  GU      Denies incontinence.  Neuro      Complains of headaches.      Denies brief paralysis and inability to speak.      No seizures with syncope. Non specific frontal headache; as needed Aleve.No PMH migraine. Dr Anne Hahn did MRI 1 mo ago for "roaring in head". He follows her for  myasthenia   Physical Exam  General:     in no acute distress; alert,appropriate and cooperative throughout examination;underweight appearing.   Lungs:     Normal respiratory effort, chest expands symmetrically. Lungs: decreased BS w/o increased WOB Heart:     normal rate, regular rhythm, no gallop, no rub, no JVD, no HJR, and grade 1 /6 systolic murmur.   Abdomen:     Bowel sounds positive,abdomen soft and  diffusely tender without masses, organomegaly or hernias noted. Pulses:     R and L carotid bruits R>L;dorsalis pedis and posterior tibial pulses are decreased Extremities:     No clubbing, cyanosis, edema;DJD of hands Neurologic:     alert & oriented X3, cranial nerves II-XII intact, strength normal in all extremities, and DTRs symmetrical and normal.   Skin:     Intact without suspicious lesions or rashes Cervical Nodes:     No lymphadenopathy noted Axillary Nodes:     No palpable lymphadenopathy Psych:     normally interactive, good eye contact, and not anxious appearing.      Impression & Recommendations:  Problem # 1:  SYNCOPE (ICD-780.2)  Orders: Neurology Referral (Neuro)   Problem # 2:  ABDOMINAL PAIN (ICD-789.00)  Orders: Gastroenterology Referral (GI)   Problem # 3:  UNSPECIFIED ANEMIA (ICD-285.9)  Orders: Gastroenterology Referral (GI) TLB-CBC Platelet - w/Differential (85025-CBCD) TLB-B12 + Folate Pnl (86578_46962-X52/WUX) TLB-IBC Pnl (Iron/FE;Transferrin) (83550-IBC)   Problem # 4:  DIABETES MELLITUS, TYPE II, CONTROLLED (ICD-250.00)  Her updated medication list for this problem includes:    Lisinopril 40 Mg Tabs (Lisinopril) .Marland Kitchen... Take one tablet daily    Baby Aspirin Chew (Aspirin chew)    Metformin Hcl 500 Mg Tabs (Metformin hcl) .Marland Kitchen... 1 by mouth qd    Glimepiride 2 Mg Tabs (Glimepiride) .Marland Kitchen... 1/2 tab qam  Orders: TLB-A1C / Hgb A1C (Glycohemoglobin) (83036-A1C)   Complete Medication List: 1)  Lisinopril 40 Mg Tabs (Lisinopril) ....  Take one tablet daily 2)  Sertraline Hcl 50 Mg Tabs (Sertraline hcl) .Marland Kitchen.. 1 by mouth qd 3)  Levothyroxine Sodium 112 Mcg Tabs (Levothyroxine sodium) .Marland Kitchen.. 1 tab daily except 1/2 tab tues &sat 4)  Prednisone 10 Mg Tabs (Prednisone) .... Once daily 5)  Baby Aspirin Chew (Aspirin chew) 6)  Metformin Hcl 500 Mg Tabs (Metformin hcl) .Marland Kitchen.. 1 by mouth qd 7)  Clonazepam 1 Mg Tabs (Clonazepam) .... As needed sleep 8)  Calcium and Vit D  9)  Vit B12 Tab  10)  Pravachol 40 Mg Tabs (Pravastatin sodium) .Marland Kitchen.. 1 qhs 11)  Lorazepam 1 Mg Tabs (Lorazepam) .Marland Kitchen.. 1 by mouth two times a day-tid prn 12)  Glimepiride 2 Mg Tabs (Glimepiride) .... 1/2 tab qam 13)  Century Silver  .... Qd 14)  Nitroglycerin 0.3 Mg Subl (Nitroglycerin) .Marland Kitchen.. 1 sl prn 15)  Ascensia Breeze 2 Disk (Glucose blood) .... Use as directed once daily   Patient Instructions: 1)  Complete stool cards   ]

## 2010-08-16 NOTE — Assessment & Plan Note (Signed)
Summary: acute ras/itching/cbs   Vital Signs:  Patient Profile:   77 Years Old Female Height:     65.5 inches Weight:      134 pounds Temp:     97.8 degrees F oral Pulse rate:   72 / minute Resp:     18 per minute BP sitting:   104 / 66  (left arm) Cuff size:   regular  Vitals Entered By: Shonna Chock (May 26, 2008 10:36 AM)                 PCP:  Dr. Marga Melnick, M.D.    Chief Complaint:  ONGOING CONCERN-ITCHING ALL OVER.  History of Present Illness: Itching > 1 month "from lower belly  to top of boobs" since Event monitor applied. She  removed it after 2 weeks because of itching. Rx: Calamine, CortAid/Aloe, cortisone & by mouth Benadryl w/o benefit.On ACE -I  w/o angioedema. FBS 78-132; 2 hrs pc < 142.    Current Allergies (reviewed today): ! SILVADENE     Review of Systems  General      Denies chills, fever, and sweats.  ENT      Denies difficulty swallowing, hoarseness, nasal congestion, and sinus pressure.      No facial pain, purulence or frontal ha  Resp      Denies wheezing.  GI      Denies constipation and diarrhea.  Derm      Complains of itching.      Denies changes in color of skin, lesion(s), poor wound healing, and rash.      Redness only from itching, no hives  Neuro      Denies numbness and tingling.  Endo      Denies cold intolerance, excessive hunger, excessive thirst, excessive urination, heat intolerance, polyuria, and weight change.  Allergy      Denies hives or rash, itching eyes, and sneezing.      No angioedema   Physical Exam  General:     in no acute distress; alert,appropriate and cooperative throughout examination Nose:     External nasal examination shows no deformity or inflammation. Nasal mucosa are pink and moist without lesions or exudates. Septum to R Mouth:     Oral mucosa and oropharynx without lesions or exudates. No angioedema Neck:     No deformities, masses, or tenderness noted. Lungs:  Normal respiratory effort, chest expands symmetrically. Lungs are clear to auscultation, no crackles or wheezes. Neurologic:     alert & oriented X3 and DTRs symmetrical and normal.   Skin:     Irregular erythematous rash scattered over thorax ant & post; minimal dermatographia; blanching with pressure Cervical Nodes:     No lymphadenopathy noted Axillary Nodes:     No palpable lymphadenopathy Psych:     memory intact for recent and remote, normally interactive, good eye contact, not anxious appearing, and not depressed appearing.      Impression & Recommendations:  Problem # 1:  RASH-NONVESICULAR (ICD-782.1)  Orders: Venipuncture (95638)   Problem # 2:  DIABETES MELLITUS, TYPE II, CONTROLLED (ICD-250.00)  Her updated medication list for this problem includes:    Lisinopril 40 Mg Tabs (Lisinopril) .Marland Kitchen... Take one tablet daily    Aspirin 81 Mg Tbec (Aspirin) ..... Once daily    Metformin Hcl 500 Mg Tabs (Metformin hcl) .Marland Kitchen... 1 by mouth qd    Glimepiride 2 Mg Tabs (Glimepiride) .Marland Kitchen... 1/2 tab qam  Orders: Venipuncture (75[address]) TLB-Creatinine, Blood (82565-CREA) TLB-BUN (Urea Nitrogen) (  84520-BUN) TLB-Potassium (K+) (84132-K) TLB-A1C / Hgb A1C (Glycohemoglobin) (83036-A1C)   Problem # 3:  UNSPECIFIED HYPOTHYROIDISM (ICD-244.9)  Her updated medication list for this problem includes:    Levothyroxine Sodium 112 Mcg Tabs (Levothyroxine sodium) .Marland Kitchen... 1 tab daily except 1/2 tab tues &sat  Orders: Venipuncture ([address]) TLB-TSH (Thyroid Stimulating Hormone) (84443-TSH)   Problem # 4:  VITAMIN D DEFICIENCY (ICD-268.9)  Complete Medication List: 1)  Lisinopril 40 Mg Tabs (Lisinopril) .... Take one tablet daily 2)  Sertraline Hcl 50 Mg Tabs (Sertraline hcl) .Marland Kitchen.. 1 by mouth qd 3)  Levothyroxine Sodium 112 Mcg Tabs (Levothyroxine sodium) .Marland Kitchen.. 1 tab daily except 1/2 tab tues &sat 4)  Aspirin 81 Mg Tbec (Aspirin) .... Once daily 5)  Metformin Hcl 500 Mg Tabs (Metformin hcl) .Marland Kitchen.. 1  by mouth qd 6)  Clonazepam 1 Mg Tabs (Clonazepam) .... As needed sleep 7)  Calcium and Vit D  .... Once daily 8)  Pravachol 40 Mg Tabs (Pravastatin sodium) .Marland Kitchen.. 1 qhs 9)  Lorazepam 1 Mg Tabs (Lorazepam) .Marland Kitchen.. 1 by mouth two times a day-tid prn 10)  Glimepiride 2 Mg Tabs (Glimepiride) .... 1/2 tab qam 11)  Century Silver  .... Qd 12)  Nitroglycerin 0.3 Mg Subl (Nitroglycerin) .Marland Kitchen.. 1 sl prn 13)  Ascensia Breeze 2 Disk (Glucose blood) .... Use as directed once daily 14)  Neurontin 300 Mg Caps (Gabapentin) .Marland Kitchen.. 1 by mouth in the am, 2 by mouth at bedtime 15)  Alendronate Sodium 70 Mg Tabs (Alendronate sodium) .Marland KitchenMarland KitchenMarland Kitchen 1 weekly as discussed 16)  Vitamin D 60454 Unit Caps (Ergocalciferol) .Marland Kitchen.. 1 pill weekly 17)  Ranitidine Hcl 150 Mg Tabs (Ranitidine hcl) .Marland Kitchen.. 1 two times a day pre meal 18)  Hydroxyzine Pamoate 25 Mg Caps (Hydroxyzine pamoate) .Marland Kitchen.. 1 q6 hrs as needed itching 19)  Prednisone 20 Mg Tabs (Prednisone) .Marland Kitchen.. 1 two times a day with a meal   Patient Instructions: 1)  Check your blood sugars regularly. If your readings are usually above : 130 or below 70 you should contact our office. CORTISONE two times a day as needed TOPICALLY. Bland diet as discussed   Prescriptions: PREDNISONE 20 MG TABS (PREDNISONE) 1 two times a day WITH A MEAL  #14 x 0   Entered and Authorized by:   Marga Melnick MD   Signed by:   Marga Melnick MD on 05/26/2008   Method used:   Print then Give to Patient   RxID:   [id number] HYDROXYZINE PAMOATE 25 MG CAPS (HYDROXYZINE PAMOATE) 1 q6 hrs as needed itching  #30 x 0   Entered and Authorized by:   Marga Melnick MD   Signed by:   Marga Melnick MD on 05/26/2008   Method used:   Print then Give to Patient   RxID:   [id number] RANITIDINE HCL 150 MG TABS (RANITIDINE HCL) 1 two times a day pre meal  #60 x 0   Entered and Authorized by:   Marga Melnick MD   Signed by:   Marga Melnick MD on 05/26/2008   Method used:   Print then Give to Patient   RxID:    [id number]  ]

## 2010-08-16 NOTE — Letter (Signed)
Summary: [facility]  [facility]   Imported By: Lanelle Bal 05/21/2009 08:30:19  _____________________________________________________________________  External Attachment:    Type:   Image     Comment:   External Document

## 2010-08-16 NOTE — Progress Notes (Signed)
Summary: Scheduled Appointment-Discuss Bone Density  Phone Note Outgoing Call   Call placed by: Shonna Chock,  May 07, 2008 9:57 AM Call placed to: Patient Summary of Call: CALLED PATIENT AND SCHEDULED APPOINTMENT FOR 05/12/2008 @ 11:45AM TO DISCUSS BONE DENSITY. PATIENT PREFERRED AM APPOINTMENT DUE TO CARING FOR GRANDCHILDREN. Shonna Chock  May 07, 2008 9:59 AM

## 2010-08-16 NOTE — Progress Notes (Signed)
Summary: WANTS TO CHange DIRECTIONS ON LEVOTHYROXINE/HOP  Phone Note Call from Patient   Caller: Patient Summary of Call: PT CALLED AND LEFT MSG ABOUT HER LEVOTHYROXINE RX CAN WE CHANGE HER DIRECTIONS TO SAY USE AS DIRECTED, PHARMACY IS  CHARGING ME AN EXTRA $4 SINCE DIRECTIONS ARE 1/2 TAB ON TUE AND SAT Initial call taken by: Kandice Hams,  March 25, 2009 2:20 PM  Follow-up for Phone Call        change to  daily as directed #90 Follow-up by: Marga Melnick MD,  March 25, 2009 2:52 PM  Additional Follow-up for Phone Call Additional follow up Details #1::        [facility] Additional Follow-up by: Kandice Hams,  March 26, 2009 10:53 AM    New/Updated Medications: LEVOTHYROXINE SODIUM 112 MCG TABS (LEVOTHYROXINE SODIUM) 1 tab daily as directed Prescriptions: LEVOTHYROXINE SODIUM 112 MCG TABS (LEVOTHYROXINE SODIUM) 1 tab daily as directed  #90 x 0   Entered by:   Kandice Hams   Authorized by:   Marga Melnick MD   Signed by:   Kandice Hams on 03/26/2009   Method used:   [facility] to ...       [facility]  [address] [id number]       [address]       [place], [place]  [address]       Ph: [phone] or [phone]       Fax: [phone]   RxID:   [id number]

## 2010-08-16 NOTE — Progress Notes (Signed)
Summary: Hop--Refill  Phone Note Refill Request   Refills Requested: Medication #1:  LEVOTHYROXINE SODIUM 112 MCG TABS 1 tab daily except 1/2 tab tues &sat [facility] [address] [phone]  Initial call taken by: Freddy Jaksch,  January 21, 2008 8:23 AM      Prescriptions: LEVOTHYROXINE SODIUM 112 MCG TABS (LEVOTHYROXINE SODIUM) 1 tab daily except 1/2 tab tues &sat  #30 x 1   Entered by:   Kandice Hams   Authorized by:   Marga Melnick MD   Signed by:   Kandice Hams on 01/21/2008   Method used:   Electronically sent to ...       [facility]  [address] [id number]       [address]       [place], [place]  [address]       Ph: [phone] or [phone]       Fax: [phone]   RxID:   [id number]

## 2010-08-16 NOTE — Assessment & Plan Note (Signed)
Summary: DISCUSS BONE DENSITY/SCM   Vital Signs:  Patient Profile:   77 Years Old Female Height:     65.5 inches Weight:      133.6 pounds Temp:     97.8 degrees F oral Pulse rate:   80 / minute Resp:     18 per minute BP sitting:   110 / 72  (left arm) Cuff size:   regular  Vitals Entered By: Shonna Chock (May 12, 2008 12:15 PM)                 PCP:  Dr. Marga Melnick, M.D.    Chief Complaint:  DISCUSS BONE DENSITY-COPY GIVEN TO PATIENT.  History of Present Illness: BMD reviewed : T score -2.3 @ femur. On Ca++ 600mg  two times a day with 200 International Units vit D.On by mouth steroids X 1 year for visual complications of myasthenia; off 3 weeks. On thyroid Rx. No PMH fractures . FH : 3 sisters have osteoporosis. No bone building Rx to date. Daughter here  "she doesn't remember all doctors tell her." Post scalp lesions biopsied X 2 by Dr Danella Deis; "she was worried about lymphoma". Other differentials : ?spider bite but no fever ,chills , N&V or abdominal pain. I reviewed EMR; no Path report in record from Dr Danella Deis    Current Allergies (reviewed today): ! SILVADENE     Review of Systems  General      Denies chills, fever, sweats, and weight loss.  Eyes      Denies double vision and vision loss-both eyes.   Physical Exam  General:     in no acute distress; alert,appropriate and cooperative throughout examination;thin Neck:     No deformities, masses, or tenderness noted. Lungs:     Normal respiratory effort, chest expands symmetrically. Lungs : decreased BS w/o  crackles or wheezes. Heart:     Normal rate and regular rhythm. S1 and S2 normal without gallop, murmur, click, rub. S4 Abdomen:     Bowel sounds positive,abdomen soft and non-tender without masses, organomegaly or hernias noted. Extremities:     No clubbing, cyanosis, edema. Marked DJD of hands, mild knee crepitus Skin:     Plaque like lesions in scalp . Papular changes on back in vertical  distribution between  graft  donor sites. Cervical Nodes:     No lymphadenopathy noted Axillary Nodes:     No palpable lymphadenopathy Psych:     slightly anxious.      Impression & Recommendations:  Problem # 1:  OSTEOPENIA (ICD-733.90)  Orders: Venipuncture (29562) T-Vitamin D (25-Hydroxy) (13086-57846)  Her updated medication list for this problem includes:    Alendronate Sodium 70 Mg Tabs (Alendronate sodium) .Marland Kitchen... 1 weekly as discussed   Problem # 2:  DERMATITIS, SCALP (ICD-692.9)  The following medications were removed from the medication list:    Prednisone 10 Mg Tabs (Prednisone) ..... Once daily  Orders: Dermatology Referral (Derma)   Problem # 3:  UNSPECIFIED ANEMIA (ICD-285.9) S/P GI evaluation by Dr Jarold Motto The following medications were removed from the medication list:    Tandem Plus 162-115.2-1 Mg Caps (Fefum-fepo-fa-b cmp-c-zn-mn-cu) .Marland Kitchen... Take one capsule by mouth daily   Complete Medication List: 1)  Lisinopril 40 Mg Tabs (Lisinopril) .... Take one tablet daily 2)  Sertraline Hcl 50 Mg Tabs (Sertraline hcl) .Marland Kitchen.. 1 by mouth qd 3)  Levothyroxine Sodium 112 Mcg Tabs (Levothyroxine sodium) .Marland Kitchen.. 1 tab daily except 1/2 tab tues &sat 4)  Aspirin 81 Mg Tbec (Aspirin) .Marland KitchenMarland KitchenMarland Kitchen  Once daily 5)  Metformin Hcl 500 Mg Tabs (Metformin hcl) .Marland Kitchen.. 1 by mouth qd 6)  Clonazepam 1 Mg Tabs (Clonazepam) .... As needed sleep 7)  Calcium and Vit D  .... Once daily 8)  Pravachol 40 Mg Tabs (Pravastatin sodium) .Marland Kitchen.. 1 qhs 9)  Lorazepam 1 Mg Tabs (Lorazepam) .Marland Kitchen.. 1 by mouth two times a day-tid prn 10)  Glimepiride 2 Mg Tabs (Glimepiride) .... 1/2 tab qam 11)  Century Silver  .... Qd 12)  Nitroglycerin 0.3 Mg Subl (Nitroglycerin) .Marland Kitchen.. 1 sl prn 13)  Ascensia Breeze 2 Disk (Glucose blood) .... Use as directed once daily 14)  Neurontin 300 Mg Caps (Gabapentin) .Marland Kitchen.. 1 by mouth in the am, 2 by mouth at bedtime 15)  Alendronate Sodium 70 Mg Tabs (Alendronate sodium) .Marland KitchenMarland KitchenMarland Kitchen 1 weekly as  discussed   Patient Instructions: 1)  Use CortAid & Aloe once daily two times a day to itchy rash on back   Prescriptions: CLONAZEPAM 1 MG TABS (CLONAZEPAM) as needed sleep  #30 x 5   Entered and Authorized by:   Marga Melnick MD   Signed by:   Marga Melnick MD on 05/12/2008   Method used:   Print then Give to Patient   RxID:   [id number] ALENDRONATE SODIUM 70 MG TABS (ALENDRONATE SODIUM) 1 weekly as discussed  #4 x 11   Entered and Authorized by:   Marga Melnick MD   Signed by:   Marga Melnick MD on 05/12/2008   Method used:   [facility]        [facility]  [address] [id number]       [address]       [place], [place]  [address]       Ph: [phone] or [phone]       Fax: [phone]   RxID:   [id number]  ]  Appended Document: DISCUSS BONE DENSITY/SCM Derm Path report reviewed:"Atypical Lymphoid Infiltrate with Follicular Mucinosis". additionally I reviewed EMR ; she will need TSH monitor.

## 2010-08-16 NOTE — Consult Note (Signed)
Summary: [facility]  [facility]   Imported By: Lanelle Bal 11/25/2008 13:10:40  _____________________________________________________________________  External Attachment:    Type:   Image     Comment:   External Document

## 2010-08-16 NOTE — Assessment & Plan Note (Signed)
Summary: PAIN UNDER RIGHT BREAST//CA   Vital Signs:  Patient Profile:   77 Years Old Female Weight:      122.25 pounds Temp:     98.4 degrees F oral Pulse rate:   68 / minute Resp:     16 per minute BP sitting:   98 / 62  (left arm)  Pt. in pain?   no                PCP:  Lowne       Chief Complaint:  Pt. in office for pain under LEFT BREAST.Marland Kitchen  History of Present Illness: Pt here c/o pain under L breast.  It feels like her bra is too tight---     Current Allergies: ! SILVADENE (SILVER SULFADIAZINE CREA)  Past Medical History:    Unremarkable    Diabetes mellitus, type II    Hyperlipidemia    Hypertension    Hyperthyroidism     Review of Systems  General      Denies chills, fatigue, fever, loss of appetite, malaise, sleep disorder, sweats, weakness, and weight loss.  CV      Denies bluish discoloration of lips or nails, chest pain or discomfort, difficulty breathing at night, difficulty breathing while lying down, fainting, fatigue, leg cramps with exertion, lightheadness, near fainting, palpitations, shortness of breath with exertion, swelling of feet, swelling of hands, and weight gain.  Resp      Denies chest discomfort, chest pain with inspiration, cough, coughing up blood, excessive snoring, hypersomnolence, morning headaches, pleuritic, shortness of breath, sputum productive, and wheezing.  MS      L sided rib pain   Physical Exam  General:     Well-developed,well-nourished,in no acute distress; alert,appropriate and cooperative throughout examination Chest Wall:     L sided rib pain  Breasts:     No mass, nodules, thickening, tenderness, bulging, retraction, inflamation, nipple discharge or skin changes noted.   Lungs:     Normal respiratory effort, chest expands symmetrically. Lungs are clear to auscultation, no crackles or wheezes. Heart:     normal rate, regular rhythm, and no murmur.   Abdomen:     Bowel sounds positive,abdomen soft and  non-tender without masses, organomegaly or hernias noted. Msk:     L sided rib pain with palp    Impression & Recommendations:  Problem # 1:  CHEST PAIN, ATYPICAL (ICD-786.59) check cxr tylenol prn  Problem # 2:  RIB PAIN, LEFT SIDED (ICD-786.50) tylenol as needed, warm compresses Orders: CXR- 2view (CXR) Diagnostic X-Ray/Fluoroscopy (Diagnostic X-Ray/Flu)        EKG  Procedure date:  01/11/2007  Findings:      sinus rhythm at 70

## 2010-08-16 NOTE — Assessment & Plan Note (Signed)
Summary: SINUS INFECTION,COUGH/ALR   Vital Signs:  Patient Profile:   77 Years Old Female Weight:      125.38 pounds O2 Sat:      96 % Temp:     96.7 degrees F oral Pulse rate:   82 / minute Pulse rhythm:   regular BP sitting:   110 / 60  (left arm) Cuff size:   large  Pt. in pain?   no  Vitals Entered By: Wendall Stade (April 23, 2007 9:12 AM) Oxygen therapy Room Air                  PCP:  Laury Axon       Chief Complaint:  cough, , and URI symptoms.  History of Present Illness: sick for one week, everything hurts, coughing and blowing green, yellow mucus, sweats, denies fever,   URI Symptoms      This is a 77 year old woman who presents with URI symptoms.  Loose stool 04/22/07. Bilat facial pain & gum pain. Rx: OTC Tylenol Cold.  The patient reports nasal congestion, purulent nasal discharge, and productive cough, but denies clear nasal discharge, sore throat, dry cough, earache, and sick contacts.  Associated symptoms include dyspnea.  The patient denies fever, low-grade fever (<100.5 degrees), fever of 100.5-103 degrees, fever of 103.1-104 degrees, fever to >104 degrees, stiff neck, wheezing, rash, vomiting, diarrhea, use of an antipyretic, and response to antipyretic.  The patient also reports itchy watery eyes, itchy throat, headache, muscle aches, and severe fatigue.  The patient denies sneezing, seasonal symptoms, and response to antihistamine.  The patient denies the following risk factors for Strep sinusitis: double sickening and Strep exposure.    Current Allergies (reviewed today): ! SILVADENE Updated/Current Medications (including changes made in today's visit):  LISINOPRIL 40 MG TABS (LISINOPRIL) Take one tablet daily SERTRALINE HCL 50 MG TABS (SERTRALINE HCL) 1 by mouth qd LEVOTHYROXINE SODIUM 112 MCG TABS (LEVOTHYROXINE SODIUM) 1 tab daily except 1/2 tab tues &sat PREDNISONE 10 MG TABS (PREDNISONE) once daily BABY ASPIRIN  CHEW (ASPIRIN CHEW)  LORAZEPAM  TABS  (LORAZEPAM TABS) two times a day - three times a day METFORMIN HCL 500 MG TABS (METFORMIN HCL) 1 by mouth qd CLONAZEPAM 1 MG TABS (CLONAZEPAM) as needed sleep * CALCIUM AND VIT D  * SENTURN SILVER  * VIT B12 TAB  PRAVACHOL 40 MG  TABS (PRAVASTATIN SODIUM) 1 qhs AUGMENTIN 500-125 MG  TABS (AMOXICILLIN-POT CLAVULANATE) 1 two times a day with meals PROMETHAZINE-CODEINE 6.25-10 MG/5ML  SYRP (PROMETHAZINE-CODEINE) 1 -2 tsp q 4-6 hrs prn       Physical Exam  General:     In NAD Eyes:     pupils equal, pupils round, pupils reactive to light, corneas and lenses clear, and no injection.   Full EOM Ears:     TMs dull ; otic tube R TM Nose:     External nasal examination shows no deformity or inflammation. Nasal mucosa are pink and moist without lesions or exudates. Mouth:     Dentures Lungs:     Normal respiratory effort, chest expands symmetrically. Lungs are clear to auscultation, no crackles or wheezes. Dry cough Cervical Nodes:     No lymphadenopathy noted Axillary Nodes:     No palpable lymphadenopathy    Impression & Recommendations:  Problem # 1:  SINUSITIS- ACUTE-NOS (ICD-461.9)  Her updated medication list for this problem includes:    Augmentin 500-125 Mg Tabs (Amoxicillin-pot clavulanate) .Marland Kitchen... 1 two times a day  with meals    Promethazine-codeine 6.25-10 Mg/35ml Syrp (Promethazine-codeine) .Marland Kitchen... 1 -2 tsp q 4-6 hrs prn   Problem # 2:  BRONCHITIS-ACUTE (ICD-466.0)  Orders: CXR- 2view (CXR)  Her updated medication list for this problem includes:    Augmentin 500-125 Mg Tabs (Amoxicillin-pot clavulanate) .Marland Kitchen... 1 two times a day with meals    Promethazine-codeine 6.25-10 Mg/16ml Syrp (Promethazine-codeine) .Marland Kitchen... 1 -2 tsp q 4-6 hrs prn   Complete Medication List: 1)  Lisinopril 40 Mg Tabs (Lisinopril) .... Take one tablet daily 2)  Sertraline Hcl 50 Mg Tabs (Sertraline hcl) .Marland Kitchen.. 1 by mouth qd 3)  Levothyroxine Sodium 112 Mcg Tabs (Levothyroxine sodium) .Marland Kitchen.. 1 tab  daily except 1/2 tab tues &sat 4)  Prednisone 10 Mg Tabs (Prednisone) .... Once daily 5)  Baby Aspirin Chew (Aspirin chew) 6)  Lorazepam Tabs (Lorazepam tabs) .... Two times a day - three times a day 7)  Metformin Hcl 500 Mg Tabs (Metformin hcl) .Marland Kitchen.. 1 by mouth qd 8)  Clonazepam 1 Mg Tabs (Clonazepam) .... As needed sleep 9)  Calcium and Vit D  10)  Senturn Silver  11)  Vit B12 Tab  12)  Pravachol 40 Mg Tabs (Pravastatin sodium) .Marland Kitchen.. 1 qhs 13)  Augmentin 500-125 Mg Tabs (Amoxicillin-pot clavulanate) .Marland Kitchen.. 1 two times a day with meals 14)  Promethazine-codeine 6.25-10 Mg/66ml Syrp (Promethazine-codeine) .Marland Kitchen.. 1 -2 tsp q 4-6 hrs prn   Patient Instructions: 1)  Drink as much fluid as you can tolerate for the next few days. Plain Mucinex for thick secretions.    Prescriptions: PROMETHAZINE-CODEINE 6.25-10 MG/5ML  SYRP (PROMETHAZINE-CODEINE) 1 -2 tsp q 4-6 hrs prn  #240 cc x 0   Entered and Authorized by:   Marga Melnick MD   Signed by:   Marga Melnick MD on 04/23/2007   Method used:   Electron[facility] sent to ...       [id number] [address]       [address]       [place], [place]  [address]       Ph: [phone] or [phone]       Fax: [phone]   RxID:   [id number] AUGMENTIN 500-125 MG  TABS (AMOXICILLIN-POT CLAVULANATE) 1 two times a day with meals  #20 x 0   Entered and Authorized by:   Marga Melnick MD   Signed by:   Marga Melnick MD on 04/23/2007   Method used:   Electronically sent to ...       [id number] [address]       [address]       [place], [place]  [address]       Ph: [phone] or [phone]       Fax: [phone]   RxID:   [id number]  ]

## 2010-08-16 NOTE — Progress Notes (Signed)
Summary: ***Recent Labs***  Phone Note Outgoing Call   Call placed by: Shonna Chock,  May 14, 2008 8:26 AM Summary of Call: SPOKE WITH PATIENT RE-RECENT LABS, PATIENT AWARE VIT-D LEVEL LOW, RX S[facility] [address]. COPY OF LABS AND APPOINTMENT CARD TO BE MAILED TO PATIENT./Chrae [facility]  May 14, 2008 8:27 AM

## 2010-08-16 NOTE — Letter (Signed)
Summary: [facility]  [facility]   Imported By: Lanelle Bal 03/17/2009 12:15:38  _____________________________________________________________________  External Attachment:    Type:   Image     Comment:   External Document

## 2010-08-16 NOTE — Progress Notes (Signed)
Summary: Culture Results  Phone Note Outgoing Call Call back at [facility] Phone [phone]   Call placed by: Shonna Chock,  October 21, 2009 2:00 PM Call placed to: Patient Summary of Call: Spoke with patient and informed her UTI, and [facility] [address]. Patient ok'd information./Chrae Malloy  October 21, 2009 2:02 PM

## 2010-08-16 NOTE — Progress Notes (Signed)
Summary: HOP-LEVOTHYROXINE  Phone Note Refill Request Message from:  Fax from Pharmacy on April 13, 2008 9:35 AM  Refills Requested: Medication #1:  LEVOTHYROXINE SODIUM 112 MCG TABS 1 tab daily except 1/2 tab tues &sat [facility]:[phone]  Initial call taken by: Doristine Devoid,  April 13, 2008 9:35 AM      Prescriptions: LEVOTHYROXINE SODIUM 112 MCG TABS (LEVOTHYROXINE SODIUM) 1 tab daily except 1/2 tab tues &sat  #30 x 2   Entered by:   Kandice Hams   Authorized by:   Marga Melnick MD   Signed by:   Kandice Hams on 04/13/2008   Method used:   [facility]ed to ...       [facility]  [address] [id number]       [address]       [place], [place]  [address]       Ph: [phone] or [phone]       Fax: [phone]   RxID:   [id number]

## 2010-08-16 NOTE — Assessment & Plan Note (Signed)
Summary: Kapidex refill  Prescriptions: KAPIDEX 60 MG  CPDR (DEXLANSOPRAZOLE) Take one capsule by mouth 30 minutes before breakfast  #30 x 11   Entered by:   Harlow Mares CMA   Authorized by:   Mardella Layman MD FACG,FAGA   Signed by:   Harlow Mares CMA on 12/11/2007   Method used:   Electronically sent to ...       [facility]  [address] [id number]       [address]       [place], [place]  [address]       Ph: [phone] or [phone]       Fax: [phone]   RxID:   [id number]
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
## 2010-08-16 NOTE — Progress Notes (Signed)
Summary: MEDS FOR HERNIA  Medications Added OMEPRAZOLE 40 MG  CPDR (OMEPRAZOLE) 1 each day 30 minutes before meal       Phone Note Call from Patient Call back at [facility] Phone [phone]   Caller: Patient Call For: PATTERSON Reason for Call: Talk to Nurse Summary of Call: PATTERSON PT.  NEEDS TO DISCUSS MEDS FOR HIATEL HERNIA THAT SHE CAN AFFORD.  [facility] [address]  PATIENT'S CHART HAS BEEN REQUESTED Initial call taken by: Magdalen Spatz [facility],  December 17, 2007 10:48 AM  Follow-up for Phone Call        can not afford aciphex. wants omeprazole Follow-up by: Harlow Mares CMA,  December 17, 2007 11:00 AM    New/Updated Medications: OMEPRAZOLE 40 MG  CPDR (OMEPRAZOLE) 1 each day 30 minutes before meal   Prescriptions: OMEPRAZOLE 40 MG  CPDR (OMEPRAZOLE) 1 each day 30 minutes before meal  #30 x 11   Entered by:   Harlow Mares CMA   Authorized by:   Mardella Layman MD FACG,FAGA   Signed by:   Harlow Mares CMA on 12/17/2007   Method used:   [facility] to ...       [facility]  [address] [id number]       [address]       [place], [place]  [address]       Ph: [phone] or [phone]       Fax: [phone]   RxID:   [id number]

## 2010-08-16 NOTE — Progress Notes (Signed)
Summary: Paper from [employer]  Phone Note Call from Patient   Summary of Call: Dr.Hopper  paper from [employer] was dropped off today when finish it needs to be faxed to [phone].  paper given to the nurse Initial call taken by: Vanessa [place],  March 03, 2008 1:51 PM  Follow-up for Phone Call        on ledge for hop in red folder........Marland KitchenDoristine Devoid  March 03, 2008 3:01 PM   Additional Follow-up for Phone Call Additional follow up Details #1::        the [employer] forms simply need printout of diagnoses  attached & sent back; doctor does NOT need to see these forms Additional Follow-up by: Marga Melnick MD,  March 03, 2008 5:11 PM    Additional Follow-up for Phone Call Additional follow up Details #2::    information faxed....Marland KitchenMarland KitchenDoristine Devoid  March 04, 2008 10:22 AM
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
## 2010-08-16 NOTE — Consult Note (Signed)
Summary: [facility]  [facility]   Imported By: Lanelle Bal 04/22/2009 11:54:13  _____________________________________________________________________  External Attachment:    Type:   Image     Comment:   External Document

## 2010-08-16 NOTE — Progress Notes (Signed)
Summary: Appointment Concerns  Phone Note Call from Patient Call back at Home Phone [phone]   Caller: Patient Summary of Call: Message left on VM: Patient would like to know what pending appointment for 08/2009 is for?  I spoke with patient, patient had labs on 02/15/2009 and was instructed to recheck labs in 6months, which is what her pending lab appointment  is for. Patient said she would like to make sure a TSH level gets added. Done  Brandi Raymond  July 20, 2009 11:30 AM

## 2010-08-16 NOTE — Progress Notes (Signed)
Summary: RX FOR LORAZEPAM  Phone Note Outgoing Call   Refills Requested: Medication #1:  LORAZEPAM  TABS two times a day - three times a day   Last Refilled: 05/08/2007 [facility] [address] FAX IS [phone]  Initial call taken by: Job Founds,  May 29, 2007 1:22 PM      Prescriptions: LORAZEPAM  TABS (LORAZEPAM TABS) two times a day - three times a day  #30 x 2   Entered by:   Wendall Stade   Authorized by:   Marga Melnick MD   Signed by:   Wendall Stade on 05/30/2007   Method used:   [facility]phoned to ...       [facility]  [address] [id number]       [address]       [place], [place]  [address]       Ph: [phone] or [phone]       Fax: [phone]   RxID:   [id number] LORAZEPAM  TABS (LORAZEPAM TABS) two times a day - three times a day  #30 x 2   Entered and Authorized by:   Wendall Stade   Signed by:   Wendall Stade on 05/30/2007   Method used:   Print then Give to Patient   RxID:   [id number]

## 2010-08-16 NOTE — Progress Notes (Signed)
Summary: Controlled Med Refill Request  Phone Note Refill Request Message from:  Fax from Pharmacy on May 23, 2010 10:47 AM  Refills Requested: Medication #1:  CLONAZEPAM 1 MG TABS as [facility] [phone]  Initial call taken by: Okey Regal Spring,  May 23, 2010 10:48 AM  Follow-up for Phone Call        [place], instruction indicated 2-3 x daily, would you like to increase dispense number to 90 instead of 60 Follow-up by: Shonna Chock CMA,  May 23, 2010 4:17 PM  Additional Follow-up for Phone Call Additional follow up Details #1::        OK but label every 8 -12 hrs as needed only; can be habit forming Additional Follow-up by: Marga Melnick MD,  May 24, 2010 5:28 AM    New/Updated Medications: CLONAZEPAM 1 MG TABS (CLONAZEPAM) every 8 -12 hrs as needed only; can be habit forming Prescriptions: CLONAZEPAM 1 MG TABS (CLONAZEPAM) every 8 -12 hrs as needed only; can be habit forming  #90 x 0   Entered by:   Shonna Chock CMA   Authorized by:   Marga Melnick MD   Signed by:   Shonna Chock CMA on 05/24/2010   Method used:   Printed then faxed to ...       [facility]  [address] [id number]       [address]       [place], [place]  [address]       Ph: [phone] or [phone]       Fax: [phone]   RxID:   [id number]

## 2010-08-16 NOTE — Letter (Signed)
Summary: Results Follow up Letter   at [facility]  [address] [place], [place] [address]   Phone: [phone]  Fax: [phone]    09/16/2008 MRN: [id number]  [facility] [address] [place], [place]  [address]  Dear Brandi Raymond,  The following are the results of your recent test(s):  Test         Result    Pap Smear:        Normal _____  Not Normal _____ Comments: ______________________________________________________ Cholesterol: LDL(Bad cholesterol):         Your goal is less than:         HDL (Good cholesterol):       Your goal is more than: Comments:  ______________________________________________________ Mammogram:        Normal _____  Not Normal _____ Comments:  ___________________________________________________________________ Hemoccult:        Normal _____  Not normal _______ Comments:    _____________________________________________________________________ Other Tests: PLEASE SEE ATTACHED LABS DONE ON 09/11/2008 AND APPOINTMENT CARD TO RECHECK LABS 02/15/2009(MONDAY) @ 10:00AM    We routinely do not discuss normal results over the telephone.  If you desire a copy of the results, or you have any questions about this information we can discuss them at your next office visit.   Sincerely,

## 2010-08-16 NOTE — Letter (Signed)
Summary: Results Follow-up Letter  [place] at [facility]  [address] [place], [place] [address]   Phone: [phone]  Fax: [phone]    12/06/2007        Brandi Raymond [address] [place], [place]  [address]  Dear [name],   The following are the results of your recent test(s):  Test     Result     Pap Smear    Normal_______  Not Normal_____       Comments: _________________________________________________________ Cholesterol LDL(Bad cholesterol):          Your goal is less than:         HDL (Good cholesterol):        Your goal is more than: _________________________________________________________ Other Tests:   _________________________________________________________  Please call for an appointment Or Please see attached.________________________________________________________ _________________________________________________________ _________________________________________________________  Sincerely,  Ardyth [place] at [facility]

## 2010-08-16 NOTE — Progress Notes (Signed)
Summary: PT HAS BEEN FEELING LIKE SHE IS GOING TO PASS OUT./FYI ED   Phone Note Call from Patient Call back at Home Phone [phone]   Caller: Patient Reason for Call: Acute Illness Summary of Call: DR HOPPER/ PT CALLED AND SAID SHE HAS BEEN FEELING VERY DIZZI FOR THE PAST COUPLE OF DAYS ALSO NAUSIA AND STOMACHACHE. SHE HAS ALSO BEEN FEELING LIKE SHE IS ABOUT TO PASSOUT. PT WANTS TO WAIT TO BE SEEN IN THE AFTERNOON. SHE WOULD LIKE TO WAIT FOR HER DAUGHTER.  Initial call taken by: Job Founds,  Dec 02, 2007 9:07 AM  Follow-up for Phone Call        Spoke with pt who says feel ojk right now have episoeds of dizziniess oand on sat night in kitchen felt dizzy wnet to turn light off and woke up in a pool of vomit does not remember anything did not go to ED,since then episoed of dzziness and a little stomach pain-Recommend pt to go to ED for assessment, pt reluctant does not want to go sitting around all day her daughter can take her to ov, Asked for daughter Olegario Messier phone number left msg  [phone] for her to call.Kandice Hams  Dec 02, 2007 9:42 AM  Follow-up by: Kandice Hams,  Dec 02, 2007 9:42 AM  Additional Follow-up for Phone Call Additional follow up Details #1::        Spoke with pt daughter Olegario Messier in ref to her mother and the sx she has ahd recommend ED today forthorough assessment , daughter agreed will take mother to ED.Kandice Hams  Dec 02, 2007 10:24 AM  Additional Follow-up by: Kandice Hams,  Dec 02, 2007 10:24 AM

## 2010-08-16 NOTE — Progress Notes (Signed)
Summary: LORAZEPAM REFILL  Phone Note Refill Request Message from:  Fax from Pharmacy on April 19, 2010 9:19 AM  Refills Requested: Medication #1:  LORAZEPAM 1 MG  TABS 1 by mouth two times a day-tid prn   [facility] [address] [place] - [phone]    QTY = 60  Initial call taken by: Jerolyn Shin,  April 19, 2010 9:20 AM    Prescriptions: LORAZEPAM 1 MG  TABS (LORAZEPAM) 1 by mouth two times a day-tid prn  #60 x 1   Entered by:   Shonna Chock CMA   Authorized by:   Marga Melnick MD   Signed by:   Shonna Chock CMA on 04/19/2010   Method used:   Printed then faxed to ...       [facility]  [address] [id number] (retail)       [address]       [place], [place]  [address]       Ph: [phone] or [phone]       Fax: [phone]   RxID:   [id number]

## 2010-08-16 NOTE — Letter (Signed)
Summary: External Correspondence/[facility]  External Correspondence/[facility]   Imported By: Freddy Jaksch 03/19/2007 12:28:53  _____________________________________________________________________  External Attachment:    Type:   Image     Comment:   External Document

## 2010-08-16 NOTE — Procedures (Signed)
Summary: [facility]   Imported By: Harlow Mares CMA 12/05/2007 14:49:24  _____________________________________________________________________  External Attachment:    Type:   Image     Comment:   External Document

## 2010-08-16 NOTE — Progress Notes (Signed)
Summary: HOPPER-REFILL  Phone Note Refill Request Message from:  Fax from [facility]acy on [facility] [address]  Refills Requested: Medication #1:  CLONAZEPAM 1 MG TABS as needed sleep  last office visit 09/11/08 and last refill was 11-18-08 #30 3.................Marland KitchenFelecia Deloach CMA  April 20, 2009 8:33 AM   Initial call taken by: Jeremy Johann CMA,  April 20, 2009 8:30 AM  Follow-up for Phone Call        OKX 1 , RX 1 Follow-up by: Marga Melnick MD,  April 20, 2009 1:35 PM    Prescriptions: CLONAZEPAM 1 MG TABS (CLONAZEPAM) as needed sleep  #30 x 1   Entered by:   Jeremy Johann CMA   Authorized by:   Marga Melnick MD   Signed by:   Jeremy Johann CMA on 04/20/2009   Method used:   Printed then faxed to ...       [facility]  [address] [id number] (retail)       [address]       [place], [place]  [address]       Ph: [phone] or [phone]       Fax: [phone]   RxID:   [id number]

## 2010-08-16 NOTE — Miscellaneous (Signed)
Summary: BONE DENSITY  Clinical Lists Changes  Orders: Added new Test order of T-Bone Densitometry (77080) - Signed Added new Test order of T-Lumbar Vertebral Assessment (77082) - Signed 

## 2010-08-16 NOTE — Progress Notes (Signed)
----   Converted from flag ---- ---- 01/03/2010 2:39 PM, Okey Regal Spring wrote: appt scheduled [id number]  ---- 12/31/2009 3:31 PM, Okey Regal Spring wrote: lm am for patient to call & schedule lab in august  ---- 12/31/2009 6:00 AM, Marga Melnick MD wrote: Labs needed in 02/2010: TSH,free T4, T3 RU,total T3 (? free T3). Codes: 242.00,374.41,375.15. Results to Dr Newt Lukes, [facility] , [place]: [phone] ------------------------------

## 2010-08-16 NOTE — Progress Notes (Signed)
Summary: FYI-labs  Phone Note From Other Clinic   Caller: [facility] Summary of Call: Lawson Fiscal from [facility] center called and pt is coming here on 08/19/09 and they would like a Thyroid profile and TSH done. They would like labs faxed to them per Dr.Gandhi. (fax number- [phone]) Initial call taken by: Army Fossa CMA,  July 21, 2009 10:28 AM  Follow-up for Phone Call        Patient called yesterday and we discussed this Follow-up by: Shonna Chock,  July 21, 2009 11:06 AM  Additional Follow-up for Phone Call Additional follow up Details #1::        I called Lawson Fiscal to clarify what they mean by a Thyroid profile, Regina in our lab said we dont have an actual lab called "Thyroid Profile" ? if they mean Ft3,Ft4. I was instructed that these are the following labs needed TSH, T3 uptake, Ft4, T4, T3total. (These were added to pending appointment orders) Additional Follow-up by: Shonna Chock,  July 21, 2009 11:14 AM

## 2010-08-16 NOTE — Progress Notes (Signed)
Summary: Refill Request  Phone Note Refill Request Message from:  [facility] [address] [facility] #: [phone]  Refills Requested: Medication #1:  CLONAZEPAM 1 MG TABS as needed sleep   Dosage confirmed as above?Dosage Confirmed   Supply Requested: 1 month   Last Refilled: 09/12/2009 Next Appointment Scheduled: 4.4.11 Initial call taken by: Harold Barban,  October 04, 2009 9:51 AM    Prescriptions: CLONAZEPAM 1 MG TABS (CLONAZEPAM) as needed sleep  #30 x 1   Entered by:   Shonna Chock   Authorized by:   Marga Melnick MD   Signed by:   Shonna Chock on 10/04/2009   Method used:   Printed then faxed to ...       [facility]  [address] [id number] (retail)       [address]       [place], [place]  [address]       Ph: [phone] or [phone]       Fax: [phone]   RxID:   [id number]

## 2010-08-16 NOTE — Consult Note (Signed)
Summary: [facility]  [facility]   Imported By: Lanelle Bal 02/26/2008 12:21:41  _____________________________________________________________________  External Attachment:    Type:   Image     Comment:   External Document

## 2010-08-16 NOTE — Letter (Signed)
Summary: Results Follow up Letter  [place] at [facility]  [address] [place], [place] [address]   Phone: [phone]  Fax: [phone]    05/14/2008 MRN: [id number]  [facility] [address] [place], [place]  [address]  Dear Brandi Raymond,  The following are the results of your recent test(s):  Test         Result    Pap Smear:        Normal _____  Not Normal _____ Comments: ______________________________________________________ Cholesterol: LDL(Bad cholesterol):         Your goal is less than:         HDL (Good cholesterol):       Your goal is more than: Comments:  ______________________________________________________ Mammogram:        Normal _____  Not Normal _____ Comments:  ___________________________________________________________________ Hemoccult:        Normal _____  Not normal _______ Comments:    _____________________________________________________________________ Other Tests: Please see attached labs done on 05/14/2008    We routinely do not discuss normal results over the telephone.  If you desire a copy of the results, or you have any questions about this information we can discuss them at your next office visit.   Sincerely,
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
## 2010-08-16 NOTE — Procedures (Signed)
Summary: Gastroenterology Colon  Gastroenterology Colon   Imported By: Harlow Mares CMA 12/05/2007 14:45:12  _____________________________________________________________________  External Attachment:    Type:   Image     Comment:   External Document

## 2010-08-16 NOTE — Progress Notes (Signed)
Summary: HOPPER-REFILL-LEVOTHYROXINE SODIUM  Phone Note Refill Request   Refills Requested: Medication #1:  LEVOTHYROXINE SODIUM 112 MCG TABS 1 tab daily except 1/2 tab tues &sat   Supply Requested: 1 month PT SAYS PHARM TOLD HER THEY HAD FAXED OVER 3 REQUEST AND HAVE NOT RECEIVED A RESPONSE.  SHE IS COMPLETELY OUT AND WANTS TO KNOW IF YOU COULD CALL THIS IN AS SOON AS POSSIBLE.  Initial call taken by: Gwen Pounds,  October 23, 2007 9:54 AM  Follow-up for Phone Call        FILLED, PATIENT AWARE ..................................................................Marland KitchenChrae Malloy  October 23, 2007 10:10 AM       Prescriptions: LEVOTHYROXINE SODIUM 112 MCG TABS (LEVOTHYROXINE SODIUM) 1 tab daily except 1/2 tab tues &sat  #30 x 2   Entered by:   Shonna Chock   Authorized by:   Marga Melnick MD   Signed by:   Shonna Chock on 10/23/2007   Method used:   Electron[facility] to ...       [facility]  [address] [id number]       [address]       [place], [place]  [address]       Ph: [phone] or [phone]       Fax: [phone]   RxID:   [id number]

## 2010-08-16 NOTE — Miscellaneous (Signed)
Summary: Orders Update   Clinical Lists Changes  Problems: Added new problem of KNEE PAIN, RIGHT (ICD-719.46) Orders: Added new Referral order of Orthopedic Referral (Ortho) - Signed   Appended Document: Orders Update Lipoma vs epidermoid inclusion cyst behind L ear ,freeely moveable & non tender. Eucerin:Cortaid 1:1 two times a day. To report temp, tenderness. Crepitus R knee with decreased ROM. Rec : Orthopedic consult.

## 2010-08-16 NOTE — Letter (Signed)
Summary: Results Follow up Letter  [place] [facility]  [address] [place], [place] [address]   Phone: [phone]  Fax: [phone]    02/19/2009 MRN: [id number]  [facility] [address] [place], [place]  [address]  Dear Brandi Raymond,  The following are the results of your recent test(s):  Test         Result    Pap Smear:        Normal _____  Not Normal _____ Comments: ______________________________________________________ Cholesterol: LDL(Bad cholesterol):         Your goal is less than:         HDL (Good cholesterol):       Your goal is more than: Comments:  ______________________________________________________ Mammogram:        Normal _____  Not Normal _____ Comments:  ___________________________________________________________________ Hemoccult:        Normal _____  Not normal _______ Comments:    _____________________________________________________________________ Other Tests: PLEASE SEE ATTACHED LABS DONE ON 02/15/2009 AND APPOINTMENT CARD TO RECHECK LABS IN 6 MONTHS    We routinely do not discuss normal results over the telephone.  If you desire a copy of the results, or you have any questions about this information we can discuss them at your next office visit.   Sincerely,

## 2010-08-16 NOTE — Progress Notes (Signed)
Summary: RX  Phone Note Refill Request Message from:  [facility] [address] on [phone]  Refills Requested: Medication #1:  LORAZEPAM 1 MG  TABS 1 by mouth two times a day-tid prn Initial call taken by: Freddy Jaksch,  March 30, 2009 9:41 AM  Follow-up for Phone Call        last filled 09-21-08 #60 3, last ov 09-11-08.Felecia Deloach CMA  March 30, 2009 10:50 AM   Additional Follow-up for Phone Call Additional follow up Details #1::        [facility] #60,RX1 Additional Follow-up by: Marga Melnick MD,  March 30, 2009 3:19 PM    RXPrescriptions: LORAZEPAM 1 MG  TABS (LORAZEPAM) 1 by mouth two times a day-tid prn  #60 x 1   Entered by:   Jeremy Johann CMA   Authorized by:   Marga Melnick MD   Signed by:   Jeremy Johann CMA on 03/30/2009   Method used:   Printed then faxed to ...       [facility]  [address] [id number]       [address]       [place], [place]  [address]       Ph: [phone] or [phone]       Fax: [phone]   RxID:   [id number]

## 2010-08-16 NOTE — Assessment & Plan Note (Signed)
Summary: ROV DISCUSS LAB.[facility]   Vital Signs:  Patient Profile:   77 Years Old Female Weight:      122 pounds Pulse rate:   72 / minute Pulse rhythm:   regular BP sitting:   130 / 74  (left arm) Cuff size:   large  Pt. in pain?   no  Vitals Entered By: Wendall Stade (February 06, 2007 10:25 AM)                PCP:  Laury Axon       Chief Complaint:  fu labs and Type 2 diabetes mellitus follow-up.  History of Present Illness:  Type 2 Diabetes Mellitus Follow-Up      This is a 77 year old woman who presents for Type 2 diabetes mellitus follow-up.  Big toes numb X 3 mos.Ran out of strips; "couldn't afford them". On steroids for myasthenia, 10 mg once daily .  The patient denies polyuria, polydipsia, blurred vision, self managed hypoglycemia, hypoglycemia requiring help, weight loss, weight gain, and numbness of extremities.  Other symptoms include orthostatic symptoms.  The patient denies the following symptoms: neuropathic pain, chest pain, vomiting, poor wound healing, intermittent claudication, vision loss, and foot ulcer.  Since the last visit the patient reports poor dietary compliance, compliance with medications, noncompliance with medications, not exercising regularly, and not monitoring blood glucose.  Since the last visit, the patient reports having had eye care by an ophthalmologist.    Hypertension History:      She complains of visual symptoms and neurologic problems, but denies headache, chest pain, palpitations, dyspnea with exertion, orthopnea, PND, peripheral edema, and syncope.  She notes no problems with any antihypertensive medication side effects.        Positive major cardiovascular risk factors include female age 20 years old or older, diabetes, hyperlipidemia, hypertension, and current tobacco user.  Negative major cardiovascular risk factors include negative family history for ischemic heart disease.        Positive history for target organ damage include ASHD (either  angina; prior MI; prior CABG).  Further assessment for target organ damage reveals no history of stroke/TIA or peripheral vascular disease.    Lipid Management History:      Positive NCEP/ATP III risk factors include female age 21 years old or older, diabetes, current tobacco user, hypertension, and ASHD (atherosclerotic heart disease).  Negative NCEP/ATP III risk factors include no history of early menopause without estrogen hormone replacement, no family history for ischemic heart disease, no prior stroke/TIA, no peripheral vascular disease, and no history of aortic aneurysm.      Current Allergies (reviewed today): ! SILVADENE Updated/Current Medications (including changes made in today's visit):  LISINOPRIL 40 MG TABS (LISINOPRIL) Take one tablet daily SERTRALINE HCL 50 MG TABS (SERTRALINE HCL) 1 by mouth qd LEVOTHYROXINE SODIUM 112 MCG TABS (LEVOTHYROXINE SODIUM) 1 tab daily except 1/2 tab tues,thur,sat PREDNISONE 10 MG TABS (PREDNISONE) once daily BABY ASPIRIN  CHEW (ASPIRIN CHEW)  LORAZEPAM  TABS (LORAZEPAM TABS) two times a day - three times a day METFORMIN HCL 500 MG TABS (METFORMIN HCL) 1 by mouth qd CLONAZEPAM 1 MG TABS (CLONAZEPAM) as needed sleep * CALCIUM AND VIT D  * SENTURN SILVER  * VIT B12 TAB  PRAVACHOL 40 MG  TABS (PRAVASTATIN SODIUM) 1 qhs   Past Surgical History:    1993 MI & angioplasty    Colonoscopy   Family History:    Father: MI    Mother: thyroid disease, ?CA  Siblings: COAD; colon CA   Risk Factors:  Tobacco use:  current    Cigarettes:  Yes -- 1/6 pack(s) per day  Family History Risk Factors:    Family History of MI in females < 52 years old:  no    Family History of MI in males < 42 years old:  no    Physical Exam  General:     underweight appearing.   Neck:     thyroid small , sl irreg Lungs:     decreased BS Heart:     grade1/2  /6 systolic murmur.  R carotid bruit Abdomen:     Bowel sounds positive,abdomen soft and  non-tender without masses, organomegaly or hernias noted. Msk:     marked DJD of hands Pulses:     decreased DPP Extremities:     no edema Neurologic:     sensation intact to light touch.   Skin:     Intact without suspicious lesions or rashes Psych:     still smoking(risk discussed & Chantix offered)    Impression & Recommendations:  Problem # 1:  HYPERLIPIDEMIA NEC/NOS (ICD-272.4)  The following medications were removed from the medication list:    Lovastatin 20 Mg Tabs (Lovastatin)    Lovastatin 20 Mg Tabs (Lovastatin)  Her updated medication list for this problem includes:    Pravachol 40 Mg Tabs (Pravastatin sodium) .Marland Kitchen... 1 qhs   Problem # 2:  AODM (ICD-250.00)  Her updated medication list for this problem includes:    Lisinopril 40 Mg Tabs (Lisinopril) .Marland Kitchen... Take one tablet daily    Baby Aspirin Chew (Aspirin chew)    Metformin Hcl 500 Mg Tabs (Metformin hcl) .Marland Kitchen... 1 by mouth qd   Problem # 3:  CIGARETTE SMOKER (ICD-305.1)  Problem # 4:  HYPERTENSION, ESSENTIAL NOS (ICD-401.9)  The following medications were removed from the medication list:    Norvasc 5 Mg Tabs (Amlodipine besylate)    Norvasc 5 Mg Tabs (Amlodipine besylate)  Her updated medication list for this problem includes:    Lisinopril 40 Mg Tabs (Lisinopril) .Marland Kitchen... Take one tablet daily   Medications Added to Medication List This Visit: 1)  Sertraline Hcl 50 Mg Tabs (Sertraline hcl) .Marland Kitchen.. 1 by mouth qd 2)  Levothyroxine Sodium 112 Mcg Tabs (Levothyroxine sodium) .Marland Kitchen.. 1 tab daily except 1/2 tab tues,thur,sat 3)  Metformin Hcl 500 Mg Tabs (Metformin hcl) .Marland Kitchen.. 1 by mouth qd 4)  Calcium and Vit D  5)  Senturn Silver  6)  Vit B12 Tab  7)  Pravachol 40 Mg Tabs (Pravastatin sodium) .Marland Kitchen.. 1 qhs  Hypertension Assessment/Plan:      The patient's hypertensive risk group is category C: Target organ damage and/or diabetes.  Today's blood pressure is 130/74.    Lipid Assessment/Plan:      Based on NCEP/ATP  III, the patient's risk factor category is "history of coronary disease, peripheral vascular disease, cerebrovascular disease, or aortic aneurysm along with either diabetes, current smoker, or LDL > 130 plus HDL < 40 plus triglycerides > 200".  From this information, the patient's calculated lipid goals are as follows: Total cholesterol goal is 200; LDL cholesterol goal is 70; HDL cholesterol goal is 40; Triglyceride goal is 150.     Patient Instructions: 1)  Change Lovastatin to pravastatin; recheck fasting lipids,GOT,GPT, BUN,creat, A1c in 2/09 (272.4, 995.2, 401.9, 250.00). Consider cigarette weaning as discussed. Report any chest pain or new neuroloogic symptoms.    Prescriptions: PRAVACHOL 40 MG  TABS (PRAVASTATIN  SODIUM) 1 qhs  #90 x 1   Entered and Authorized by:   Marga Melnick MD   Signed by:   Marga Melnick MD on 02/06/2007   Method used:   Print then Give to Patient   RxID:   [id number]

## 2010-08-16 NOTE — Progress Notes (Signed)
Summary: [facility] rx  Phone Note Refill Request Message from:  Patient on June 20, 2010 12:08 PM  Refills Requested: Medication #1:  PRAVACHOL 40 MG  TABS 1 at bedtime   Supply Requested: 90day   Medication #2:  LISINOPRIL 40 MG TABS Take one tablet daily  Medication #3:  LEVOTHYROXINE SODIUM 112 MCG TABS 1 tab daily EXCEPT 1/2 pill Tues  Medication #4:  SERTRALINE HCL 50 MG TABS 1 by mouth qd Patient did not leave the name of the meds she needed, so I called for more info.  Patient will begin using [facility] on Jul 17 2010. She is aware we will send on her behalf.  Initial call taken by: Lucious Groves CMA,  June 20, 2010 12:08 PM    Prescriptions: PRAVACHOL 40 MG  TABS (PRAVASTATIN SODIUM) 1 at bedtime  #30 x 5   Entered by:   Lucious Groves CMA   Authorized by:   Marga Melnick MD   Signed by:   Lucious Groves CMA on 06/20/2010   Method used:   Faxed to ...       [facility] (mail-order)             , [place]         Ph: [phone]       Fax: [phone]   RxID:   [id number] LEVOTHYROXINE SODIUM 112 MCG TABS (LEVOTHYROXINE SODIUM) 1 tab daily EXCEPT 1/2 pill Tues, Th  & Sat  #90 x 2   Entered by:   Lucious Groves CMA   Authorized by:   Marga Melnick MD   Signed by:   Lucious Groves CMA on 06/20/2010   Method used:   Faxed to ...       [facility] (mail-order)             , [place]         Ph: [phone]       Fax: [phone]   RxID:   [id number] SERTRALINE HCL 50 MG TABS (SERTRALINE HCL) 1 by mouth qd  #90 x 2   Entered by:   Lucious Groves CMA   Authorized by:   Marga Melnick MD   Signed by:   Lucious Groves CMA on 06/20/2010   Method used:   Faxed to ...       [facility] (mail-order)             , [place]         Ph: [phone]       Fax: [phone]   RxID:   [id number] LISINOPRIL 40 MG TABS (LISINOPRIL) Take one tablet daily  #90 x 2   Entered by:   Lucious Groves CMA   Authorized by:   Marga Melnick MD   Signed by:   Lucious Groves CMA on 06/20/2010   Method used:   Faxed to ...       [facility] (mail-[facility])             , [place]         Ph: [phone]       Fax: [phone]   RxID:   [id number]

## 2010-08-16 NOTE — Progress Notes (Signed)
Summary: REFILL REQUEST  Phone Note Refill Request Call back at [phone] Message from:  Pharmacy on January 24, 2010 10:45 AM  Refills Requested: Medication #1:  CLONAZEPAM 1 MG TABS as needed sleep   Dosage confirmed as above?Dosage Confirmed   Supply Requested: 1 month   Last Ref[facility]: 12/20/[facility] [address]  Next Appointment Scheduled: AUGUST 2ND 2011 Initial call taken by: Lavell Islam,  January 24, 2010 10:45 AM    Prescriptions: CLONAZEPAM 1 MG TABS (CLONAZEPAM) as needed sleep  #30 x 1   Entered by:   Shonna Chock   Authorized by:   Marga Melnick MD   Signed by:   Shonna Chock on 01/24/2010   Method used:   Printed then faxed to ...       [facility]  [address] [id number]       [address]       [place], [place]  [address]       Ph: [phone] or [phone]       Fax: [phone]   RxID:   [id number]

## 2010-08-16 NOTE — Letter (Signed)
Summary: Results Follow up Letter   at [facility]  [address] [place], [place] [address]   Phone: [phone]  Fax: [phone]    11/02/2009 MRN: [id number]  [facility] [address] [place], [place]  [address]  Dear Ms. Borboa,  The following are the results of your recent test(s):  Test         Result    Pap Smear:        Normal _____  Not Normal _____ Comments: ______________________________________________________ Cholesterol: LDL(Bad cholesterol):         Your goal is less than:         HDL (Good cholesterol):       Your goal is more than: Comments:  ______________________________________________________ Mammogram:        Normal _____  Not Normal _____ Comments:  ___________________________________________________________________ Hemoccult:        Normal __X___  Not normal _______ Comments:    _____________________________________________________________________ Other Tests:    We routinely do not discuss normal results over the telephone.  If you desire a copy of the results, or you have any questions about this information we can discuss them at your next office visit.   Sincerely,

## 2010-08-16 NOTE — Procedures (Signed)
Summary: Colonoscopy   Colonoscopy  Procedure date:  12/11/2007  Findings:      Location:  [facility].    Procedures Next Due Date:    Colonoscopy: 11/2017  Patient Name: Brandi, Raymond MRN:  Procedure Procedures: Colonoscopy CPT: [address].    with Hot Biopsy(s)CPT: Z451292.  Personnel: Endoscopist: Vania Rea. Jarold Motto, MD.  Exam Location: Exam performed in Outpatient Clinic. Outpatient  Patient Consent: Procedure, Alternatives, Risks and Benefits discussed, consent obtained, from patient. Consent was obtained by the RN.  Indications  Evaluation of: Anemia with low ferritin.  Symptoms: Constipation Patient's stools are infrequent. Patient has difficulty evacuating, strains with stool passage. Abdominal pain / bloating.  History  Current Medications: Patient is taking an non-steroidal medication. Patient is not currently taking Coumadin.  Medical/ Surgical History: Irritable Bowel Syndrome, dIVERTICULOSIS. Reflux Disease, Adult Onset Diabetes, GaLLSTONES, Hypothyroidism, Micheline Chapman,  Pre-Exam Physical: Performed Dec 11, 2007. Cardio-pulmonary exam, Rectal exam, Abdominal exam, Extremity exam, Mental status exam WNL.  Comments: Pt. history reviewed/updated, physical exam performed prior to initiation of sedation? YES Exam Exam: Extent of exam reached: Cecum, extent intended: Cecum.  The cecum was identified by appendiceal orifice and IC valve. Patient position: on left side. Time to Cecum: 00:05:06. Time for Withdrawl: 00:03:43. Colon retroflexion performed. Images taken. ASA Classification: II. Tolerance: excellent.  Monitoring: Pulse and BP monitoring, Oximetry used. Supplemental O2 given. at 2 Liters.  Colon Prep Used Golytely for colon prep. Prep results: excellent.  Sedation Meds: Patient assessed and found to be appropriate for moderate (conscious) sedation. Fentanyl 75 mcg. given IV. Versed 9 mg. given IV.  Instrument(s): CF 140L.  Serial [id number].  Findings - NORMAL EXAM: Cecum to Splenic Flexure. Not Seen: Polyps. AVM's. Colitis. Tumors. Crohn's.  - DIVERTICULOSIS: Descending Colon to Sigmoid Colon. Not bleeding. ICD9: Diverticulosis, Colon: 562.10. Comments: Extensive complex tics noted...  - POLYP: Sigmoid Colon, Maximum size: 2 mm. diminutive, sessile polyp. Procedure:  hot biopsy, removed, retrieved, Polyp sent to pathology. ICD9: Colon Polyps: 211.3.  - NORMAL EXAM: Sigmoid Colon to Rectum.   Assessment  Diagnoses: 562.10: Diverticulosis, Colon. Symptomatic.  211.3: Colon Polyps. Probable hyperplastic nodule.   Events  Unplanned Interventions: No intervention was required.  Plans Medication Plan: Fiber supplements: Methylcellulose 1 Tbsp QAM, starting Dec 11, 2007 for indefinitely.   Patient Education: Patient given standard instructions for: Diverticulosis. high fiber diet.  Disposition: After procedure patient sent to recovery. After recovery patient sent home.  Scheduling/Referral: Follow-Up prn. Await pathology to schedule patient.    cc:  Brandi Melnick, MD    [facility] [address] [address] [place], [place] [address] Telephone [phone] or [phone] Fax [phone]   REPORT OF SURGICAL PATHOLOGY   Case #: [id number] Patient Name: Brandi Raymond, Brandi Raymond. Office Chart Number:  [id number]   MRN: [id number] Pathologist: Alden Server A. Delila Spence, MD DOB/Age  [birth year] (Age: 82)    Gender: F Date Taken:  12/11/2007 Date Received: 12/12/2007   FINAL DIAGNOSIS   ***MICROSCOPIC EXAMINATION AND DIAGNOSIS***   1.  SIGMOID COLON, POLYP(S):  HYPERPLASTIC POLYP(S).  NO ADENOMATOUS CHANGE OR MALIGNANCY IDENTIFIED.   2. SMALL BOWEL BIOPSY:  BENIGN SMALL BOWEL MUCOSA.  NO VILLOUS ATROPHY, INFLAMMATION OR OTHER ABNORMALITIES PRESENT.   COMMENT 2.  There is small bowel mucosa with normal villous architecture and no objective increase in inflammation.  No villous  atrophy, active inflammation or other significant changes identified.   gdt Date Reported:  12/13/2007     Alden Server A. Delila Spence,  MD *** Electronically Signed Out By [facility] ***      Dec 13, 2007 MRN: [id number]    [facility] [address] [place], [place]  [address]    Dear Ms. Carranza,  I am pleased to inform you that the colon polyp(s) removed during your recent colonoscopy was (were) found to be hyperplastic.  These types of polyps are NOT pre-cancerous.  It is therefore my recommendation that you have a repeat colonoscopy examination in 10_ years for routine colorectal cancer screening.  Should you develop new or worsening symptoms of abdominal pain, bowel habit changes or bleeding from the rectum or bowels, please schedule an evaluation with either your primary care physician or with me.  Additional information/recommendations:  __No further action with gastroenterology is needed at this time.      Please follow-up with your primary care physician for your other      healthcare needs. __Please call [phone] to schedule a return visit to review      your situation.  __Please keep your follow-up visit as already scheduled.  _x_Continue treatment plan as outlined the day of your exam.  Please call us if you are having persistent problems or have questions about your condition that have not been fully answered at this time.  Sincerely,  Mardella Layman MD [facility]  This letter has been electronically signed by your physician.  This report was created from the original endoscopy report, which was reviewed and signed by the above listed endoscopist.   Appended Document: Colonoscopy Dr Jarold Motto: polyp,Tics

## 2010-08-16 NOTE — Letter (Signed)
Summary: [facility]  [facility]   Imported By: Lanelle Bal 01/26/2009 10:55:43  _____________________________________________________________________  External Attachment:    Type:   Image     Comment:   External Document

## 2010-08-16 NOTE — Letter (Signed)
Summary: [facility]  [facility]   Imported By: Lanelle Bal 10/07/2008 12:15:21  _____________________________________________________________________  External Attachment:    Type:   Image     Comment:   External Document

## 2010-08-16 NOTE — Letter (Signed)
Summary: Results Follow up Letter  [place] at [facility]  [address] [place], [place] [address]   Phone: [phone]  Fax: [phone]    08/21/2007 MRN: [id number]  [facility] [address] [place], [place]  [address]  Dear Ms. Bonneau,  The following are the results of your recent test(s):  Test         Result    Pap Smear:        Normal _____  Not Normal _____ Comments: ______________________________________________________ Cholesterol: LDL(Bad cholesterol):         Your goal is less than:         HDL (Good cholesterol):       Your goal is more than: Comments:  ______________________________________________________ Mammogram:        Normal _____  Not Normal _____ Comments:  ___________________________________________________________________ Hemoccult:        Normal _____  Not normal _______ Comments:    _____________________________________________________________________ Other Tests: CHEST X-RAY: VERY GOOD, NO ACUTE FINDINGS. PLEASE KEEP CARDIOLOGY APPOINTMENT    We routinely do not discuss normal results over the telephone.  If you desire a copy of the results, or you have any questions about this information we can discuss them at your next office visit.   Sincerely,

## 2010-08-16 NOTE — Progress Notes (Signed)
Summary: REFILL REQUEST  Phone Note Refill Request   Refills Requested: Medication #1:  CLONAZEPAM 1 MG TABS as needed sleep [facility] [address], FAX:[phone] Last Filled on 03/30/2009 # 60 with 1 refill, Last OV 09/11/2008  Initial call taken by: Shonna Chock,  May 17, 2009 4:00 PM  Follow-up for Phone Call        OK x1    Prescriptions: CLONAZEPAM 1 MG TABS (CLONAZEPAM) as needed sleep  #30 x 1   Entered by:   Shonna Chock   Authorized by:   Marga Melnick MD   Signed by:   Shonna Chock on 05/18/2009   Method used:   Printed then faxed to ...       [facility]  [address] [id number]       [address]       [place], [place]  [address]       Ph: [phone] or [phone]       Fax: [phone]   RxID:   [id number]

## 2010-08-16 NOTE — Progress Notes (Signed)
Summary: HOP---RX  Phone Note Refill Request   Refills Requested: Medication #1:  CLONAZEPAM 1 MG TABS as needed sleep [facility] [address]--PH-[phone]  Initial call taken by: Freddy Jaksch,  Nov 18, 2008 9:48 AM  Follow-up for Phone Call        last office visit 09/11/08 and last refil was 05/12/08.............Marland KitchenDoristine Devoid  Nov 18, 2008 4:05 PM   Additional Follow-up for Phone Call Additional follow up Details #1::        #30,RX 3 Additional Follow-up by: Marga Melnick MD,  Nov 18, 2008 4:08 PM      Prescriptions: CLONAZEPAM 1 MG TABS (CLONAZEPAM) as needed sleep  #30 x 3   Entered by:   Doristine Devoid   Authorized by:   Marga Melnick MD   Signed by:   Doristine Devoid on 11/18/2008   Method used:   Telephoned to ...       [facility]  [address] [id number])       [address]       [place], [place]  [address]       Ph: [phone] or [phone]       Fax: [phone]   RxID:   [id number]

## 2010-08-16 NOTE — Progress Notes (Signed)
Summary: refill zoloft - dr hopper  Phone Note Call from Patient Call back at Home Phone [phone]   Caller: Patient Summary of Call: patient needs new rx for zoloft (generic)- pharmacy refused to fax [facility] [address]  Initial call taken by: Okey Regal [place],  June 18, 2008 9:31 AM      Prescriptions: SERTRALINE HCL 50 MG TABS (SERTRALINE HCL) 1 by mouth qd  #30 Tablet x 3   Entered by:   Kandice Hams   Authorized by:   Marga Melnick MD   Signed by:   Kandice Hams on 06/18/2008   Method used:   Faxed to ...       [facility]  [address] [id number]ail)       [address]       [place], [place]  [address]       Ph: [phone] or [phone]       Fax: [phone]   RxID:   [id number]

## 2010-08-16 NOTE — Progress Notes (Signed)
Summary: ?meds  Medications Added ACIPHEX 20 MG  TBEC (RABEPRAZOLE SODIUM) Take 1 each day 30 minutes before meals       Phone Note Call from Patient Call back at Home Phone [phone]   Call For: Jarold Motto Summary of Call: Pt called and has questions about prescriptions, ECL 5/27. She called pharmacy and they are not ready, [facility].  Initial call taken by: Verdell Face,  Dec 12, 2007 9:20 AM  Follow-up for Phone Call        pharm never recieved rx for kapidex, I called it ot the pharm, pt aware. Follow-up by: Harlow Mares CMA,  Dec 12, 2007 9:48 AM  Additional Follow-up for Phone Call Additional follow up Details #1::        pharm called back and kapidex not covered, and they are gving her ferrous sulfate in replacement for my tandem. I will call the pt when I talk with insurance company about kapidex rx Additional Follow-up by: Harlow Mares CMA,  Dec 12, 2007 9:58 AM    Additional Follow-up for Phone Call Additional follow up Details #2::    called insurance to get Kapidex 60mg  prior auth. they will not cover Kapidex with out pt having tried and failed prevacid, nexium, aciphex. pt aware and aciphex rx sent.  Follow-up by: Harlow Mares CMA,  December 16, 2007 9:33 AM  New/Updated Medications: ACIPHEX 20 MG  TBEC (RABEPRAZOLE SODIUM) Take 1 each day 30 minutes before meals   Prescriptions: ACIPHEX 20 MG  TBEC (RABEPRAZOLE SODIUM) Take 1 each day 30 minutes before meals  #30 x 11   Entered by:   Harlow Mares CMA   Authorized by:   Mardella Layman MD FACG,FAGA   Signed by:   Harlow Mares CMA on 12/16/2007   Method used:   Electronically sent to ...       [facility]  [address] [id number]       [address]       [place], [place]  [address]       Ph: [phone] or [phone]       Fax: [phone]   RxID:   [id number]

## 2010-08-16 NOTE — Progress Notes (Signed)
Summary: Need to talk to nurse about lab results   Phone Note Call from Patient Call back at Home Phone [phone]   Caller: Patient Call For: Marga Melnick MD Summary of Call: Patient does not understand lab results that were mailed to her.  Please call to help her understand her results. Initial call taken by: Barnie Mort,  October 22, 2009 11:01 AM  Follow-up for Phone Call        Spoke with patient and she needs some stool cards mailed to her, she is already taking 1 by mouth once daily except Tu/Sat 1/2. Please re-advise on Thyroid med   **Per patient request labs faxed to [facility] [phone] (Faxed)** Follow-up by: Shonna Chock,  October 22, 2009 2:13 PM  Additional Follow-up for Phone Call Additional follow up Details #1::        Change to 1 thyroid pill  once daily EXCEPT 1/2 Tues, Th & Sat. Check TSH in 8 weeks.(244.9) Additional Follow-up by: Marga Melnick MD,  October 22, 2009 3:58 PM    Additional Follow-up for Phone Call Additional follow up Details #2::    Patient aware of med change, schduled appointment to recheck labs the 1st week in June. Follow-up by: Shonna Chock,  October 22, 2009 4:35 PM  New/Updated Medications: LEVOTHYROXINE SODIUM 112 MCG TABS (LEVOTHYROXINE SODIUM) 1 tab daily EXCEPT 1/2 pill Tues, Th  & Sat

## 2010-08-16 NOTE — Progress Notes (Signed)
Summary: HOP---RX LORAZEPAM 1 MG  Phone Note Refill Request   Refills Requested: Medication #1:  LORAZEPAM 1 MG  TABS 1 by mouth two times a day-tid prn   Last Ref[facility]: [facility]/19/[facility] [address]--[phone]  Initial call taken by: Freddy Jaksch,  September 21, 2008 10:32 AM  Follow-up for Phone Call        dr hopper please advise on med  LAST FILLED 03-09-08 #60 3 REFILLS. last OV 09-11-08...............Marland KitchenFelecia Deloach CMA  September 21, 2008 12:18 PM  Additional Follow-up for Phone Call Additional follow up Details #1::        #60,RX3 Additional Follow-up by: Marga Melnick MD,  September 21, 2008 1:11 PM    Additional Follow-up for Phone Call Additional follow up Details #2::    rx faxed to pharmacy...................Marland KitchenFelecia Deloach CMA  September 21, 2008 2:53 PM    Prescriptions: LORAZEPAM 1 MG  TABS (LORAZEPAM) 1 by mouth two times a day-tid prn  #60 x 3   Entered by:   Jeremy Johann CMA   Authorized by:   Marga Melnick MD   Signed by:   Jeremy Johann CMA on 09/21/2008   Method used:   Printed then faxed to ...       [facility]  [address] [id number]ail)       [address]       [place], [place]  [address]       Ph: [phone] or [phone]       Fax: [phone]   RxID:   [id number]

## 2010-08-16 NOTE — Progress Notes (Signed)
Summary: Fever Blister's  Phone Note Call from Patient Call back at Home Phone [phone]   Caller: Patient Summary of Call: Message left on voicemail: patient with fever blisters and needs a RX sent to the pharmacy-please call.   I spoke with patient, I discussed that I didnt see where she was treated for fever blister's in the past and she would need an appointment, patient then said her daughter already went out and got her some OTC med for her fever blisters and we can just void this message. Patient instructed  to call back if any futher assistance needed./ Chrae [facility]  June 25, 2009 11:53 AM

## 2010-08-16 NOTE — Progress Notes (Signed)
Summary:  REFILL  Phone Note Refill Request Message from:  Fax from Pharmacy  Refills Requested: Medication #1:  CLONAZEPAM 1 MG TABS as needed sleep [facility] [place]    FAX=[phone]      DIRECTIONS ON FAX FROM PHARMACY = TAKE 1 TABLET AS NEEDED FOR SLEEP--DO NOT TAKE WITH CLONAZEPAM--(????)  Initial call taken by: Jerolyn Shin,  March 22, 2010 9:21 AM    Prescriptions: CLONAZEPAM 1 MG TABS (CLONAZEPAM) as needed sleep  #30 x 1   Entered by:   Shonna Chock CMA   Authorized by:   Marga Melnick MD   Signed by:   Shonna Chock CMA on 03/22/2010   Method used:   Printed then faxed to ...       [facility]  [address] [id number] ([facility]ail)       [address]       [place], [place]  [address]       Ph: [phone] or [phone]       Fax: [phone]   RxID:   [id number]

## 2010-08-16 NOTE — Progress Notes (Signed)
Summary: NEEDS LAB RESULTS SENT TO [facility]  Phone Note Call from Patient Call back at Home Phone [phone]   Caller: Patient Summary of Call: PATIENT WAS HERE FOR LABS TODAY SO SHE CAN HAVE SURGERY ON HER EYE---PLEASE MAKE SURE THAT LAB RESULTS ARE SENT TO [facility]  (SEE INFO IN EMR DATED 09/13/2009 FOR [facility] LOCATION) Initial call taken by: Jerolyn Shin,  December 16, 2009 10:53 AM  Follow-up for Phone Call        TSH was faxed to: [phone] Follow-up by: Shonna Chock,  December 16, 2009 5:08 PM

## 2010-08-16 NOTE — Letter (Signed)
Summary: Results Follow up Letter  [place] at [facility]  [address] [place], [place] [address]   Phone: [phone]  Fax: [phone]    01/14/2007 MRN: [id number]  [facility] [address] [place], [place]  [address]  Dear Ms. Brandi Raymond,  The following are the results of your recent test(s):  Test         Result    Pap Smear:        Normal _____  Not Normal _____ Comments: ______________________________________________________ Cholesterol: LDL(Bad cholesterol):         Your goal is less than:         HDL (Good cholesterol):       Your goal is more than: Comments:  ______________________________________________________ Mammogram:        Normal _____  Not Normal _____ Comments:  ___________________________________________________________________ Hemoccult:        Normal _____  Not normal _______ Comments:    ________________________________     Other Tests: Chest x-ray and Ribs x-ray normal   We routinely do not discuss normal results over the telephone.  If you desire a copy of the results, or you have any questions about this information we can discuss them at your next office visit.   Sincerely,

## 2010-08-16 NOTE — Progress Notes (Signed)
Summary: refill  Phone Note Refill Request Message from:  Fax from [facility] fax [phone]  Refills Requested: Medication #1:  CLONAZEPAM 1 MG TABS as needed sleep  Medication #2:  LORAZEPAM 1 MG  TABS 1 by mouth two times a day-tid prn Initial call taken by: Barb Merino,  August 16, 2009 9:27 AM    Prescriptions: LORAZEPAM 1 MG  TABS (LORAZEPAM) 1 by mouth two times a day-tid prn  #60 x 1   Entered by:   Shonna Chock   Authorized by:   Marga Melnick MD   Signed by:   Shonna Chock on 08/16/2009   Method used:   Printed then faxed to ...       [facility]  [address] [id number])       [address]       [place], [place]  [address]       Ph: [phone] or [phone]       Fax: [phone]   RxID:   [id number] CLONAZEPAM 1 MG TABS (CLONAZEPAM) as needed sleep  #30 x 1   Entered by:   Shonna Chock   Authorized by:   Marga Melnick MD   Signed by:   Shonna Chock on 08/16/2009   Method used:   Printed then faxed to ...       [facility]  [address] [id number] (retail)       [address]       [place], [place]  [address]       Ph: [phone] or [phone]       Fax: [phone]   RxID:   [id number]

## 2010-08-16 NOTE — Progress Notes (Signed)
Summary: results   Phone Note Call from Patient Call back at Home Phone [phone]   Caller: Patient Reason for Call: Lab or Test Results Summary of Call: results of ultrasounds Initial call taken by: Migdalia Dk,  September 29, 2009 11:22 AM  Follow-up for Phone Call        I gave the pt her results of carotid and aorta study.  I did make the pt aware that this is preliminary and Dr Antoine Poche still needs to review these tests.  I told the pt that Dr Jenene Slicker nurse would call her back only if he made further recommendations.  Follow-up by: Julieta Gutting, RN, BSN,  September 29, 2009 11:37 AM

## 2010-08-16 NOTE — Progress Notes (Signed)
Summary: LORAZEPAM   Phone Note Refill Request Message from:  Fax from Pharmacy on March 09, 2008 9:54 AM  Refills Requested: Medication #1:  LORAZEPAM 1 MG  TABS 1 by mouth two times a day-tid prn [phone]  Initial call taken by: Doristine Devoid,  March 09, 2008 9:55 AM  Follow-up for Phone Call        last  refill 2/209 #60 5 refills last ov 12/04/07  Follow-up by: Kandice Hams,  March 09, 2008 11:16 AM  Additional Follow-up for Phone Call Additional follow up Details #1::        OK X 3 Additional Follow-up by: Marga Melnick MD,  March 09, 2008 1:10 PM      Prescriptions: LORAZEPAM 1 MG  TABS (LORAZEPAM) 1 by mouth two times a day-tid prn  #60 x 3   Entered by:   Kandice Hams   Authorized by:   Doristine Devoid   Signed by:   Kandice Hams on 03/09/2008   Method used:   Printed then faxed to ...       [facility]  [address] [id number]ail)       [address]       [place], [place]  [address]       Ph: [phone] or [phone]       Fax: [phone]   RxID:   [id number]

## 2010-08-16 NOTE — Letter (Signed)
Summary: Appointment - Reminder 2  [facility], Main Office  [address]   [place], [place] [address]   Phone: [phone]  Fax: [phone]     June 03, 2009 MRN: [id number]   [facility] [address] [place], [place]  [address]   Dear Brandi Raymond,  Our records indicate that it is time to schedule a follow-up appointment.Dr.Hochrein recommended that you follow up with [place] in Feb,2011. It is very important that we reach you to schedule this appointment. We look forward to participating in your health care needs. Please contact us at the number listed above at your earliest convenience to schedule your appointment.  If you are unable to make an appointment at this time, give [place] a call so we can update our records.     Sincerely, Garment/textile technologist Scheduling Team

## 2010-08-16 NOTE — Assessment & Plan Note (Signed)
Summary: YEARLY CHECK UP/DISCUSS LABS/SCM   Vital Signs:  Patient Profile:   77 Years Old Female Height:     65.5 inches Weight:      133.8 pounds Temp:     97.7 degrees F oral Pulse rate:   72 / minute Resp:     18 per minute BP sitting:   112 / 76  (left arm) Cuff size:   regular  Pt. in pain?   yes    Location:   lower legs    Intensity:   10 or < ,now 7    Type:       burning  Vitals Entered By: Shonna Chock (September 11, 2008 8:44 AM)                  PCP:  Dr. Marga Melnick, M.D.    Chief Complaint:  YEARLY CHECK-UP AND DISCUSS FASTING LABS . EKG DONE @ CARDIOLOGIST.  History of Present Illness: Burning from knees down for several years. Rx: Neurontin from Dr Anne Hahn for peripheral neuropathy. Recheck in 09/2008. Labs reviewed : vit D deficiency corrected (level was 10 in 10/09,now 50); anemia slightly worse ( Hct was 33.7 in 5/09; now 32.5). B12 & folate WNL; iron levels low (26 with normal > 42). Extensive GI evaluation by Dr Jarold Motto in 5/09 . Endo : HH & gastritis; colonoscopy : Tics & polyp. Other labs reviewed; all @ goal.    Current Allergies (reviewed today): ! SILVADENE  Past Medical History:    Cigarette smoker    Diabetes mellitus, type II    Hyperlipidemia    Hypertension    Hyperthyroidism    CAD (MI 20.Marland Kitchen  She had PTCA of an occluded right coronary.  Her last    catheterization was in 1996.  At that time, the LAD had 20% proximal    stenosis, 50% mid stenosis.  The left circumflex was normal.  The right    coronary artery had 30% stenosis the previous PCA site.  The EF had mild    hypokinesis of the inferior wall.  Negative stress perfusion study in 2000 with NL EF )    Myasthenia gravis followed by Dr. Anne Hahn in neurology.    Anemia-iron deficiency  Past Surgical History:    1993 MI & angioplasty    Colonoscopy 05/2000; 2009, Dr Milbert Coulter ,3rd degree, [facility] 2003    Colonoscopy polyps adenoma 11/2004    Cataract, bilat  Hysterectomy dysfunctional menses; ? ovaries remain    Foot surgery   Family History:    Father: MI @ 62,DM    Mother: thyroid CA    Siblings: sister  COAD; brother polyps, colon CA      Social History:    Patient currently smokes. 5-6 cigarettes per day    Alcohol Use - no    Daily Caffeine Use: 4 cups per day    Patient does not get regular exercise.     Review of Systems  General      Denies chills, fever, sweats, and weight loss.  Eyes      Complains of double vision.      Occa diplopia  ENT      Denies difficulty swallowing and hoarseness.  CV      Denies bluish discoloration of lips or nails, chest pain or discomfort, difficulty breathing at night, difficulty breathing while lying down, leg cramps with exertion, palpitations, swelling of feet, and swelling of hands.  Cardiology entry 09/05/2008 reviewed  Resp      Denies chest pain with inspiration and coughing up blood.      Occa cough & sputum. Last CXray < 6 months ago  GI      Denies bloody stools, dark tarry stools, and indigestion.      No dysphagia  GU      Denies discharge, dysuria, and hematuria.  MS      See HPI      Complains of joint pain.      Orthopedist injected R knee ; benefit X 1-2 weeks  Derm      Denies changes in nail beds, dryness, hair loss, and lesion(s).  Neuro      Complains of numbness, tingling, and weakness.      Myasthenia monitored by Dr Anne Hahn  Psych      Denies anxiety and depression.  Endo      Complains of cold intolerance.      Denies excessive hunger, excessive thirst, and excessive urination.      Eats ice  Heme      Denies abnormal bruising and bleeding.   Physical Exam  General:     in no acute distress; alert,appropriate and cooperative throughout examination Head:     Normocephalic and atraumatic without obvious abnormalities. Eyes:     Arcus senilis; ptosis OD Neck:     No deformities, masses, or tenderness noted. Lungs:     Normal  respiratory effort, chest expands symmetrically. Lungs are clear to auscultation, no crackles or wheezes. Decreased BS Heart:     normal rate, regular rhythm, no gallop, no HJR, and grade 1 /6 systolic murmur.   Abdomen:     Bowel sounds positive,abdomen soft and non-tender without masses, organomegaly or hernias noted. Aorta bruit w/o AAA Rectal:     Stool cards pending Pulses:     R and L carotid bruits, R>L.Radial, posterior tibial pulses are full and equal bilaterally.Decreased DPP Extremities:     No clubbing, cyanosis, edema. Marked DJD og fingers; crepitus of knees Neurologic:     alert & oriented X3.  "Somewhat of a selective memory "  Skin:     Intact without suspicious lesions or rashes. Post burn &  donor/graft chanes esp post thorax Cervical Nodes:     Small L LN Axillary Nodes:     No palpable lymphadenopathy Inguinal Nodes:     R inguinal LN tender.   Psych:     memory intact for recent and remote, normally interactive, good eye contact, not anxious appearing, and not depressed appearing.      Impression & Recommendations:  Problem # 1:  ANEMIA-IRON DEFICIENCY (ICD-280.9)  Orders: Venipuncture ([address]) TLB-IBC Pnl (Iron/FE;Transferrin) (83550-IBC)   Problem # 2:  HYPERLIPIDEMIA (ICD-272.2)  Her updated medication list for this problem includes:    Pravachol 40 Mg Tabs (Pravastatin sodium) .Marland Kitchen... 1 qhs   Problem # 3:  UNSPECIFIED HYPOTHYROIDISM (ICD-244.9)  Her updated medication list for this problem includes:    Levothyroxine Sodium 112 Mcg Tabs (Levothyroxine sodium) .Marland Kitchen... 1 tab daily except 1/2 tab tues &sat   Problem # 4:  VITAMIN D DEFICIENCY (ICD-268.9) Resolved  Problem # 5:  OTHER EMPHYSEMA (ICD-492.8)  Problem # 6:  C A D (ICD-414.00) as per Dr Antoine Poche Her updated medication list for this problem includes:    Lisinopril 40 Mg Tabs (Lisinopril) .Marland Kitchen... Take one tablet daily    Aspirin 81 Mg Tbec (Aspirin) ..... Once daily  Nitroglycerin  0.3 Mg Subl (Nitroglycerin) .Marland Kitchen... 1 sl prn   Complete Medication List: 1)  Lisinopril 40 Mg Tabs (Lisinopril) .... Take one tablet daily 2)  Sertraline Hcl 50 Mg Tabs (Sertraline hcl) .Marland Kitchen.. 1 by mouth qd 3)  Levothyroxine Sodium 112 Mcg Tabs (Levothyroxine sodium) .Marland Kitchen.. 1 tab daily except 1/2 tab tues &sat 4)  Aspirin 81 Mg Tbec (Aspirin) .... Once daily 5)  Metformin Hcl 500 Mg Tabs (Metformin hcl) .Marland Kitchen.. 1 by mouth qd 6)  Clonazepam 1 Mg Tabs (Clonazepam) .... As needed sleep 7)  Calcium and Vit D  .... Once daily 8)  Pravachol 40 Mg Tabs (Pravastatin sodium) .Marland Kitchen.. 1 qhs 9)  Lorazepam 1 Mg Tabs (Lorazepam) .Marland Kitchen.. 1 by mouth two times a day-tid prn 10)  Glimepiride 2 Mg Tabs (Glimepiride) .... 1/2 tab qam 11)  Century Silver  .... Qd 12)  Nitroglycerin 0.3 Mg Subl (Nitroglycerin) .Marland Kitchen.. 1 sl prn 13)  Ascensia Breeze 2 Disk (Glucose blood) .... Use as directed once daily 14)  Neurontin 300 Mg Caps (Gabapentin) .Marland Kitchen.. 1 by mouth in the am, 2 by mouth at bedtime 15)  Alendronate Sodium 70 Mg Tabs (Alendronate sodium) .Marland KitchenMarland KitchenMarland Kitchen 1 weekly as discussed 16)  Vitamin D 14782 Unit Caps (Ergocalciferol) .Marland Kitchen.. 1 pill weekly 17)  Ranitidine Hcl 150 Mg Tabs (Ranitidine hcl) .Marland Kitchen.. 1 two times a day pre meal  Other Orders: TLB-A1C / Hgb A1C (Glycohemoglobin) (83036-A1C)   Patient Instructions: 1)  Stop Smoking Tips: Choose a Quit date. Cut down before the Quit date. decide what you will do as a substitute when you feel the urge to smoke(gum,toothpick,exercise). 2)  Avoid foods high in acid (tomatoes, citrus juices, spicy foods). Avoid eating within two hours of lying down or before exercising. Do not over eat; try smaller more frequent meals. Elevate head of bed twelve inches when sleeping.

## 2010-08-16 NOTE — Progress Notes (Signed)
Summary: lab order  Phone Note Call from Patient   Summary of Call: patient has appt for lab [phone] - need lab order - she has had labs for tsh but no other labs Initial call taken by: Okey Regal Spring,  January 03, 2010 2:55 PM  Follow-up for Phone Call        Lipid/Hep (272.4/995.20), BMP,CBCD,A1C (250.00/ 401.9), Vit D 268.9), Stool Cards   lab order added to appt.Okey Regal Spring  January 03, 2010 3:41 PM  Follow-up by: Shonna Chock,  January 03, 2010 3:20 PM

## 2010-08-16 NOTE — Letter (Signed)
Summary: [facility]  [facility]   Imported By: Freddy Jaksch 11/04/2007 16:13:51  _____________________________________________________________________  External Attachment:    Type:   Image     Comment:   External Document

## 2010-08-16 NOTE — Progress Notes (Signed)
Summary: [facility]--refill  Phone Note Refill Request Call back at Home Phone [phone]   Refills Requested: Medication #1:  LEVOTHYROXINE SODIUM 112 MCG TABS 1 tab daily except 1/2 tab tues &sat [facility] [address] [phone]  Initial call taken by: Freddy Jaksch,  July 29, 2008 9:50 AM      Prescriptions: LEVOTHYROXINE SODIUM 112 MCG TABS (LEVOTHYROXINE SODIUM) 1 tab daily except 1/2 tab tues &sat  #30 Tablet x 6   Entered by:   Kandice Hams   Authorized by:   Marga Melnick MD   Signed by:   Kandice Hams on 07/29/2008   Method used:   [facility]ed to ...       [facility]  [address] [id number]       [address]       [place], [place]  [address]       Ph: [phone] or [phone]       Fax: [phone]   RxID:   [id number]

## 2010-08-16 NOTE — Assessment & Plan Note (Signed)
Summary: review lab/cbs   Vital Signs:  Patient profile:   77 year old female Weight:      125.6 pounds BMI:     20.66 Pulse rate:   72 / minute Resp:     17 per minute BP sitting:   104 / 68  (left arm) Cuff size:   regular  Vitals Entered By: Shonna Chock (January 20, 2010 2:44 PM) CC: Follow-up visit: discuss labs , Type 2 diabetes mellitus follow-up Comments REVIEWED MED LIST, PATIENT AGREED DOSE AND INSTRUCTION CORRECT    Primary Care Provider:  Dr. Marga Melnick, M.D.    CC:  Follow-up visit: discuss labs  and Type 2 diabetes mellitus follow-up.  History of Present Illness: Extensive labs reviewed & risks discussed. Major abnormality is  anemia. PMH of colon polyps 2006; neg exam 2009.  Type 2 Diabetes Mellitus Follow-Up      This is a 77 year old woman who presents for Type 2 diabetes mellitus follow-up.  The patient reports  numbness of feet, but denies polyuria, polydipsia, self managed hypoglycemia, weight loss, and weight gain.  Other symptoms include blurring & loss of vision due to Myasthenthenia & thyroid disease.  The patient denies the following symptoms: neuropathic pain, chest pain, vomiting, orthostatic symptoms, poor wound healing, intermittent claudication, and foot ulcer.  Since the last visit the patient reports poor dietary compliance, not exercising regularly, and monitoring blood glucose.  The patient has been measuring capillary blood glucose before breakfast average is 97.  Since the last visit, the patient reports having had eye care by an Ophthalmologist, Dr Lewis Moccasin @ [facility], but  no foot care.  A1c was 6.1%.She is not taking Glimiperide.  Hyperlipidemia Follow-Up      The patient also presents for Hyperlipidemia follow-up.  The patient reports constipation, but denies muscle aches, GI upset, abdominal pain, flushing, itching, diarrhea, and fatigue.  The patient denies the following symptoms: dypsnea, syncope, and pedal edema.  Compliance with medications  (by patient report) has been near 100%.  Adjunctive measures currently used by the patient include fish oil supplements.    Allergies: 1)  ! Silvadene  Past History:  Past Medical History: Cigarette smoker Diabetes mellitus, type II, controlled Hyperlipidemia Hypertension Hyperthyroidism with Ophthalmologic complications CAD (MI 70.Marland Kitchen  She had PTCA of an occluded right coronary.  Her last catheterization was in 1996.  At that time, the LAD had 20% proximal stenosis, 50% mid stenosis.  The left circumflex was normal.  The right coronary artery had 30% stenosis the previous PCA site.  The EF had mild hypokinesis of the inferior wall. ) Myasthenia gravis followed by Dr. Anne Hahn in neurology. Anemia-iron deficiency Right carotid stenosis (70% by angiogram 2009) Cervical spine (C2) fracture  Past Surgical History: Colonoscopy negative  05/2000; 2009, Dr Milbert Coulter ,3rd degree, [facility] 2003 Colonoscopy polyps adenoma 11/2004 Cataract, bilat Hysterectomy dysfunctional menses; ? ovaries remain Foot surgery  Review of Systems General:  Denies weight loss. ENT:  Denies nosebleeds. Resp:  Denies coughing up blood and sputum productive. GI:  Denies abdominal pain, bloody stools, dark tarry stools, and indigestion; No dysphagia. GU:  Denies hematuria. Heme:  Complains of abnormal bruising; denies bleeding.  Physical Exam  General:  Thin,in no acute distress; alert,appropriate and cooperative throughout examination Eyes:  Ptosis PD ; no lid lag Neck:  No deformities, masses, or tenderness noted. thyroid small Lungs:  Normal respiratory effort, chest expands symmetrically. Lungs are clear to auscultation; decreased BS Heart:  normal  rate, regular rhythm, no gallop, no rub, no JVD, and grade 1/2  /6 systolic murmur LSB.   Abdomen:  Bowel sounds positive,abdomen soft and non-tender without masses, organomegaly or hernias noted. Aortic bruit w/o AAA Rectal:  Given stool cards Pulses:   R and L carotid and posterior tibial pulses are full and equal bilaterally. Radial & DPP reduced. Bilat carotid bruits Neurologic:  Hyperreflexia(1& 1/2 +) UE &LE.   Skin:  Tanned Cervical Nodes:  No lymphadenopathy noted Axillary Nodes:  No palpable lymphadenopathy Psych:  memory intact for recent and remote, normally interactive, and good eye contact.     Impression & Recommendations:  Problem # 1:  ANEMIA-IRON DEFICIENCY (ICD-280.9)  Orders: Venipuncture (16109) TLB-CBC Platelet - w/Differential (85025-CBCD) TLB-B12 + Folate Pnl (60454_09811-B14/NWG) TLB-IBC Pnl (Iron/FE;Transferrin) (83550-IBC)  Problem # 2:  HYPERLIPIDEMIA (ICD-272.2) Lipids @ goal Her updated medication list for this problem includes:    Pravachol 40 Mg Tabs (Pravastatin sodium) .Marland Kitchen... 1 at bedtime- due for labs  Problem # 3:  HYPERTENSION, ESSENTIAL NOS (ICD-401.9) Controlled Her updated medication list for this problem includes:    Lisinopril 40 Mg Tabs (Lisinopril) .Marland Kitchen... Take one tablet daily  Problem # 4:  CIGARETTE SMOKER (ICD-305.1) Risks discussed  Problem # 5:  DIABETES MELLITUS, TYPE II, CONTROLLED (ICD-250.00) A1c 6.1% The following medications were removed from the medication list:    Metformin Hcl 500 Mg Tabs (Metformin hcl) .Marland Kitchen... 1 by mouth qd    Glimepiride 2 Mg Tabs (Glimepiride) .Marland Kitchen... As needed Her updated medication list for this problem includes:    Lisinopril 40 Mg Tabs (Lisinopril) .Marland Kitchen... Take one tablet daily    Aspirin 81 Mg Tbec (Aspirin) ..... Once daily  Complete Medication List: 1)  Lisinopril 40 Mg Tabs (Lisinopril) .... Take one tablet daily 2)  Sertraline Hcl 50 Mg Tabs (Sertraline hcl) .Marland Kitchen.. 1 by mouth qd 3)  Levothyroxine Sodium 112 Mcg Tabs (Levothyroxine sodium) .Marland Kitchen.. 1 tab daily except 1/2 pill tues, th  & sat 4)  Aspirin 81 Mg Tbec (Aspirin) .... Once daily 5)  Clonazepam 1 Mg Tabs (Clonazepam) .... As needed sleep 6)  Calcium and Vit D  .... Once daily 7)  Pravachol  40 Mg Tabs (Pravastatin sodium) .Marland Kitchen.. 1 at bedtime- due for labs 8)  Lorazepam 1 Mg Tabs (Lorazepam) .Marland Kitchen.. 1 by mouth two times a day-tid prn 9)  Century Silver  .... Qd 10)  Nitroglycerin 0.3 Mg Subl (Nitroglycerin) .Marland Kitchen.. 1 sl prn 11)  Ascensia Breeze 2 Disk (Glucose blood) .... Use as directed once daily 12)  Neurontin 300 Mg Caps (Gabapentin) .Marland Kitchen.. 1 by mouth at bedtime  Patient Instructions: 1)  Stop Metformin. Complete stool cards if anemia is progressive. 2)  Please schedule a follow-up appointment in 6 months. 3)  HbgA1C prior to visit, ICD-9:250.00

## 2010-08-16 NOTE — Miscellaneous (Signed)
Summary: Orders Update  Clinical Lists Changes  Medications: Changed medication from LEVOTHYROXINE SODIUM 112 MCG TABS (LEVOTHYROXINE SODIUM) 1 tab daily except 1/2 tab tues,thur,sat to LEVOTHYROXINE SODIUM 112 MCG TABS (LEVOTHYROXINE SODIUM) 1 tab daily except 1/2 tab tues &sat  Appended Document: Orders Update TSH also needed in 2/09

## 2010-08-16 NOTE — Progress Notes (Signed)
Summary: hop--x-ray result  Phone Note Call from Patient Call back at Home Phone [phone]   Caller: Patient Summary of Call: Pt is calling about her x-ray result. Initial call taken by: Freddy Jaksch,  March 16, 2008 12:11 PM  Follow-up for Phone Call        SPOKE WITH, PATIENT OK'D INFORMATION RE-CHEST X-RAY, PATIENT SAID THE PAIN IS A LITTLE BETTER WITH THE DARVOCET BUT NOT GONE. PATIENT SAID IT DIDNT UNDERSTAND  WHY SHE HAD A CHEST X-RAY WHEN HER SHOULDER BLADES ARE HURTING, DR.HOPPER PLEASE GIVE FUTHER ADVICE Follow-up by: Shonna Chock,  March 16, 2008 2:10 PM  Additional Follow-up for Phone Call Additional follow up Details #1::        The shoulder blades show up on the chest Xray . The chest film will show any significant fracture of scapula. If symptoms fail to improve, a limited bone scan would be indicated to pick small, non displaced fravctures. Additional Follow-up by: Marga Melnick MD,  March 16, 2008 5:57 PM    Additional Follow-up for Phone Call Additional follow up Details #2::    SPOKE WITH PATIENT, PATIENT WILL CALL IF NO BETTER. Follow-up by: Shonna Chock,  March 17, 2008 11:58 AM

## 2010-08-16 NOTE — Letter (Signed)
Summary: Primary Care Consult Scheduled Letter  [place] [facility] [place]  [address] [place], [place] [address]   Phone: [phone]  Fax: [phone]      05/12/2008 MRN: [id number]  [facility] [address] [place], [place]  [address]    Dear Ms. Payano,      We have scheduled an appointment for you.  At the recommendation of Dr.Hopper, we have scheduled you a consult with Dr. Gibson Ramp at [facility] on November 19th at 9:30am.  Their address is [facility]. The office phone number is [phone].  If this appointment day and time is not convenient for you, please feel free to call the office of the doctor you are being referred to at the number listed above and reschedule the appointment.     It is important for you to keep your scheduled appointments. We are here to make sure you are given good patient care. If you have questions or you have made changes to your appointment, please notify us at  [phone], ask for Tiffany.    Thank you,  Patient Care Coordinator [place] [facility]

## 2010-08-16 NOTE — Progress Notes (Signed)
Summary: sinus infection--needs an ov  Phone Note Call from Patient   Caller: Patient Reason for Call: Acute Illness Summary of Call: dr. Alwyn Ren [phone] pt has a cough,mucus, nose and face hurting due to the cough,no fever,pt has been taking over the counter medication to see if that would help but it is not helping. she also says that her chest is sore due to the coughing. sinus pressure also. she would like to be seen today by any doctor. but no one has an opening today. i did explain that we are two docotors down.  Initial call taken by: Charolette Child,  April 22, 2007 10:19 AM  Follow-up for Phone Call        SPOKE WITH PT OV SCHEDULED FOR TOMORROW AM Follow-up by: Kandice Hams,  April 22, 2007 11:19 AM

## 2010-08-16 NOTE — Progress Notes (Signed)
Summary: [facility]--REFILL  Phone Note Refill Request   Refills Requested: Medication #1:  CLONAZEPAM 1 MG TABS as needed sleep [facility] [address]--PH-[phone]  Initial call taken by: Freddy Jaksch,  February 24, 2008 12:17 PM      Prescriptions: CLONAZEPAM 1 MG TABS (CLONAZEPAM) as needed sleep  #30 x 2   Entered by:   Ardyth Man   Authorized by:   Marga Melnick MD   Signed by:   Ardyth Man on 02/24/2008   Method used:   Electronically [facility] to ...       [facility]  [address] [id number]       [address]       [place], [place]  [address]       Ph: [phone] or [phone]       Fax: [phone]   RxID:   [id number]

## 2010-08-16 NOTE — Progress Notes (Signed)
Summary: Lab Results  Phone Note Outgoing Call Call back at [facility] Phone [phone]   Call placed by: Shonna Chock,  September 16, 2008 1:43 PM Call placed to: Patient Summary of Call: SPOKE WITH PATIENT, DISCUSSED LABS: Iron levels still low (previously 260. This is most likely from low grade bleeding from gastritis. Please consider the interventions we discussed to protect stomach from an ulcer. Take a daily multivitamin WITH iron. Recheck CBC & iron level  in 4-6 months (285.9)  PATIENT OK'D ALL INSTRUCTION AND SCHEDULED APPOINTMENT TO RECHECK LABS 02/15/2009. LABS AND APPOINTMENT CARD MAILED./Chrae Malloy  September 16, 2008 1:44 PM

## 2010-08-16 NOTE — Letter (Signed)
Summary: Results Follow up Letter  [place] [facility]  [address] [place], [place] [address]   Phone: [phone]  Fax: [phone]    09/11/2008 MRN: [id number]  [facility] [address] [place], [place]  [address]  Dear Ms. Gruel,  The following are the results of your recent test(s):  Test         Result    Pap Smear:        Normal _____  Not Normal _____ Comments: ______________________________________________________ Cholesterol: LDL(Bad cholesterol):         Your goal is less than:         HDL (Good cholesterol):       Your goal is more than: Comments:  ______________________________________________________ Mammogram:        Normal _____  Not Normal _____ Comments:  ___________________________________________________________________ Hemoccult:        Normal _X____  Not normal _______ Comments:    _____________________________________________________________________ Other Tests:    We routinely do not discuss normal results over the telephone.  If you desire a copy of the results, or you have any questions about this information we can discuss them at your next office visit.   Sincerely,

## 2010-08-16 NOTE — Letter (Signed)
[facility]: [facility]   Imported By: Freddy Jaksch 07/16/2007 16:40:30  _____________________________________________________________________  External Attachment:    Type:   Image     Comment:   External Document

## 2010-08-16 NOTE — Procedures (Signed)
Summary: EGD   EGD  Procedure date:  12/11/2007  Findings:      Location: [facility]    Patient Name: Brandi Raymond, Brandi Raymond MRN:  Procedure Procedures: Panendoscopy (EGD) CPT: 43235.    with biopsy(s)/brushing(s). CPT: D1846139.  Personnel: Endoscopist: Vania Rea. Jarold Motto, MD.  Exam Location: Exam performed in Outpatient Clinic. Outpatient  Patient Consent: Procedure, Alternatives, Risks and Benefits discussed, consent obtained, from patient. Consent was obtained by the RN.  Indications  Evaluation of: Anemia,  with low ferritin.  Symptoms: Nausea. Dyspepsia, location: epigastric. Reflux symptoms  History  Current Medications: Patient is taking a non-steroidal medication. Patient is not currently taking Coumadin.  Medical/Surgical History: Irritable Bowel Syndrome, dIVERTICULOSIS. Reflux Disease, Adult Onset Diabetes, GaLLSTONES, Hyperthyroidism, Myasthenia gravis, Anxiety Disorder,  Pre-Exam Physical: Performed Dec 11, 2007  Cardio-pulmonary exam, Abdominal exam, Extremity exam, Mental status exam WNL.  Comments: Pt. history reviewed/updated, physical exam performed prior to initiation of sedation? yes Exam Exam Info: Maximum depth of insertion Duodenum, intended Duodenum. Patient position: on left side. Duration of exam: 10 minutes. Images taken. ASA Classification: II. Tolerance: excellent.  Sedation Meds: Patient assessed and found to be appropriate for moderate (conscious) sedation. Sedation was managed by the Endoscopist. Cetacaine Spray 2 sprays given aerosolized. Fentanyl 25 mcg. given IV. Versed 1 mg. given IV.  Monitoring: BP and pulse monitoring done. Oximetry used. Supplemental O2 given at 2 Liters.  Instrument(s): GIF 160. Serial [id number].   Findings - Normal: Proximal Esophagus to Distal Esophagus. Not Seen: Tumor. Barrett's esophagus. Esophageal inflammation. Foreign body. Varices.  - HIATAL HERNIA: Prolapsing, 6 cms. in length.  ICD9: Hernia, Hiatal: 553.3.Comments: Large HH noted...  - MUCOSAL ABNORMALITY: Fundus to Antrum. Nodularity present. Red spots present. RUT done, results pending. ICD9: Gastritis, Unspecified: 535.50.  - Normal: Antrum to Duodenal 2nd Portion. Not Seen: Ulcer. Mucosal abnormality. Biopsy/Normal taken. Comments: R/O celiac disease....   Assessment  Diagnoses: 553.3: Hernia, Hiatal. Chronic GERD.  535.50: Gastritis, Unspecified.   Events  Unplanned Intervention: No unplanned interventions were required.  Plans Medication(s): Await pathology. Other: Kapidex 60mg  QAM, starting Dec 11, 2007 for indefinitely.   Patient Education: Patient given standard instructions for: Hiatal Hernia. Reflux.  Disposition: After procedure patient sent to recovery. After recovery patient sent home.  Scheduling: Await pathology to schedule patient. Follow-up prn.    cc:  Marga Melnick, MD    REPORT OF SURGICAL PATHOLOGY   Case #: [id number] Patient Name: Brandi Raymond. Office Chart Number:  [id number]   MRN: [id number] Pathologist: Alden Server A. Delila Spence, MD DOB/Age  [birth year] (Age: 77)    Gender: F Date Taken:  12/11/2007 Date Received: 12/12/2007   FINAL DIAGNOSIS   ***MICROSCOPIC EXAMINATION AND DIAGNOSIS***   1.  SIGMOID COLON, POLYP(S):  HYPERPLASTIC POLYP(S).  NO ADENOMATOUS CHANGE OR MALIGNANCY IDENTIFIED.   2. SMALL BOWEL BIOPSY:  BENIGN SMALL BOWEL MUCOSA.  NO VILLOUS ATROPHY, INFLAMMATION OR OTHER ABNORMALITIES PRESENT.   COMMENT 2.  There is small bowel mucosa with normal villous architecture and no objective increase in inflammation.  No villous atrophy, active inflammation or other significant changes identified.   gdt Date Reported:  12/13/2007     Alden Server A. Delila Spence, MD *** Electronically Signed Out By EAA Brennan Bailey [address] [place], [place]  [address]    Dear Ms. Hemminger,  I am pleased to inform you that the biopsies taken during your  recent endoscopic examination did not show any evidence of cancer  upon pathologic examination.  Additional information/recommendations:  __No further action is needed at this time.  Please follow-up with      your primary care physician for your other healthcare needs.  __ Please call [phone] to schedule a return visit to review      your condition.  __xx Continue with the treatment plan as outlined on the day of your      exam.  __ You should have a repeat endoscopic examination for thi_s problem              in _ months/years.   Please call us if you are having persistent problems or have questions about your condition that have not been fully answered at this time.  Sincerely,  Mardella Layman MD [facility]  This letter has been electronically signed by your physician.  This report was created from the original endoscopy report, which was reviewed and signed by the above listed endoscopist.   Appended Document: EGD Dr Jarold Motto: gastritis,HH

## 2010-08-18 NOTE — Progress Notes (Signed)
Summary: test reults   Phone Note Call from Patient Call back at Home Phone [phone]   Caller: Patient Reason for Call: Lab or Test Results Initial call taken by: Judie Grieve,  August 12, 2010 11:28 AM  Follow-up for Phone Call        Patient wanted results of her carotid US. Advised her we would call her once Dr. Antoine Poche reviewed them. Whitney Maeola Sarah RN  August 12, 2010 11:43 AM  Follow-up by: Whitney Maeola Sarah RN,  August 12, 2010 11:43 AM

## 2010-08-18 NOTE — Miscellaneous (Signed)
  Clinical Lists Changes  Observations: Added new observation of ABDOM US: Heavily calcified abdominal aorta without dilation or stenosis Normal caliber common iliac arteries. (09/24/2009 14:02) Added new observation of US CAROTID: Moderate to severe bilateral plaque 60-79% bilateral ICA stenosis Left subclavian artery stenosis, with mild vertebral steal  f/u 6 months (09/24/2009 14:01)      Carotid Doppler  Procedure date:  09/24/2009  Findings:      Moderate to severe bilateral plaque 60-79% bilateral ICA stenosis Left subclavian artery stenosis, with mild vertebral steal  f/u 6 months  [place] of Abdomen  Procedure date:  09/24/2009  Findings:      Heavily calcified abdominal aorta without dilation or stenosis Normal caliber common iliac arteries.

## 2010-08-18 NOTE — Assessment & Plan Note (Signed)
Summary: 6 month followup, will be fasting; lab =a1c///sph   Vital Signs:  Patient profile:   77 year old female Weight:      126.4 pounds BMI:     20.79 Temp:     98.1 degrees F oral Pulse rate:   72 / minute Resp:     16 per minute BP sitting:   114 / 70  (left arm) Cuff size:   regular  Vitals Entered By: Shonna Chock CMA (July 22, 2010 9:00 AM) CC: 6 month follow-up, patient with no concerns , Type 2 diabetes mellitus follow-up   Primary Care Provider:  Dr. Marga Melnick, M.D.    CC:  6 month follow-up, patient with no concerns , and Type 2 diabetes mellitus follow-up.  History of Present Illness: Type 2 Diabetes Mellitus Follow-Up      This is a 77 year old woman who presents for Type 2 diabetes mellitus follow-up.  The patient  denies polydipsia but she describes pagophagia (eating ice) X 4 months. She has had  blurred vision (see Ophth reference below) and numbness of  toes. She also  denies  self managed hypoglycemia, weight loss, and weight gain.  The patient denies the following symptoms: neuropathic pain, chest pain, vomiting, orthostatic symptoms, poor wound healing, and intermittent claudication.  Since the last visit the patient reports not exercising regularly.  The patient has been measuring capillary blood glucose before breakfast,76-98.  Since the last visit, the patient reports having had eye care by an Ophthalmologist. OS "bulging " was  questionably related  to thyroid dysfunction; that resolved with  change  in dose of thyroid medication.  Complications in context of  diabetes include PVD. Smoking 5-6 cigarettes/ day. Hyperlipidemia Follow-Up      The patient also presents for Hyperlipidemia follow-up.  The patient reports muscle aches ("FROM OLD AGE"), but denies GI upset, abdominal pain, flushing, itching, constipation, diarrhea, and fatigue.  The patient denies the following symptoms: dypsnea, palpitations, syncope, and pedal edema.  Adjunctive measures  currently used by the patient include ASA.   Hypertension Follow-Up      The patient also presents for Hypertension follow-up.  The patient denies lightheadedness and headaches.  Adjunctive measures currently used by the patient include salt restriction.  BP not monitored.  Current Medications (verified): 1)  Lisinopril 40 Mg Tabs (Lisinopril) .... Take One Tablet Daily 2)  Sertraline Hcl 50 Mg Tabs (Sertraline Hcl) .Marland Kitchen.. 1 By Mouth Qd 3)  Levothyroxine Sodium 112 Mcg Tabs (Levothyroxine Sodium) .Marland Kitchen.. 1 Tab Daily Except 1/2 Pill Tues, Th  & Sat 4)  Aspirin 81 Mg  Tbec (Aspirin) .... Once Daily 5)  Clonazepam 1 Mg Tabs (Clonazepam) .... Every 8 -12 Hrs As Needed Only; Not On Regular Schedule Because of Balance , Sedation, Habituation Risks 6)  Calcium and Vit D .... Once Daily 7)  Pravachol 40 Mg  Tabs (Pravastatin Sodium) .Marland Kitchen.. 1 At Bedtime 8)  Lorazepam 1 Mg  Tabs (Lorazepam) .Marland Kitchen.. 1 By Mouth Two Times A Day-Tid Prn 9)  Nitroglycerin 0.3 Mg  Subl (Nitroglycerin) .Marland Kitchen.. 1 Sl Prn 10)  Ascensia Breeze 2   Disk (Glucose Blood) .... Use As Directed Once Daily 11)  Neurontin 300 Mg Caps (Gabapentin) .Marland Kitchen.. 1 By Mouth At Bedtime  Allergies: 1)  ! Silvadene  Review of Systems Resp:  Denies cough, coughing up blood, and sputum productive. GI:  Denies bloody stools, dark tarry stools, and indigestion. Derm:  Denies changes in nail beds, dryness, and  hair loss. Neuro:  Denies tingling. Endo:  Denies cold intolerance and heat intolerance.  Physical Exam  General:  Thin,in no acute distress; alert,appropriate and cooperative throughout examination Eyes:  Asymmetry of eyes ; OS slightly prominent Neck:  No deformities, masses, or tenderness noted.Thyroid small &  slightly irregular  Lungs:  Normal respiratory effort, chest expands symmetrically. Lungs are clear to auscultation, no crackles or wheezes but BS decreased  Heart:   regular rhythm, no gallop, no rub, no JVD, no HJR, and grade 1 /6 systolic  murmur.   Abdomen:  Bowel sounds positive,abdomen soft and non-tender without masses, organomegaly or hernias noted. Aortc bruit w/o AAA Pulses:  R and L carotid,radial  and posterior tibial pulses are full and equal bilaterally.L> R carotid bruits. Decreased DPP Extremities:  Minor  clubbing; no  cyanosis, edema. Neurologic:  alert & oriented X3 and sensation   to light touch decreased R foot.   Skin:  Intact without suspicious lesions or rashes Cervical Nodes:  No lymphadenopathy noted Axillary Nodes:  No palpable lymphadenopathy Psych:  memory intact for recent and remote, normally interactive, and good eye contact.     Impression & Recommendations:  Problem # 1:  UNSPECIFIED ANEMIA (ICD-285.9)  Pagophagia (ice eating) X 3 months  Orders: TLB-CBC Platelet - w/Differential (85025-CBCD) Specimen Handling (04540)  Problem # 2:  DIABETES MELLITUS, TYPE II, CONTROLLED (ICD-250.00) contrl suggested by glucose readings Her updated medication list for this problem includes:    Lisinopril 40 Mg Tabs (Lisinopril) .Marland Kitchen... Take one tablet daily    Aspirin 81 Mg Tbec (Aspirin) ..... Once daily  Orders: Venipuncture (98119) TLB-A1C / Hgb A1C (Glycohemoglobin) (83036-A1C)  Problem # 3:  HYPERLIPIDEMIA (ICD-272.2)  ? status Her updated medication list for this problem includes:    Pravachol 40 Mg Tabs (Pravastatin sodium) .Marland Kitchen... 1 at bedtime  Orders: Venipuncture (14782) TLB-Lipid Panel (80061-LIPID) TLB-Hepatic/Liver Function Pnl (80076-HEPATIC)  Problem # 4:  HYPERTENSION, ESSENTIAL NOS (ICD-401.9)  controlled Her updated medication list for this problem includes:    Lisinopril 40 Mg Tabs (Lisinopril) .Marland Kitchen... Take one tablet daily  Orders: Venipuncture (95621) TLB-BMP (Basic Metabolic Panel-BMET) (80048-METABOL)  Problem # 5:  CAROTID ARTERY STENOSIS (ICD-433.10) as per Dr Antoine Poche Her updated medication list for this problem includes:    Aspirin 81 Mg Tbec (Aspirin) ..... Once  daily  Problem # 6:  CIGARETTE SMOKER (ICD-305.1) risk discussed  Complete Medication List: 1)  Lisinopril 40 Mg Tabs (Lisinopril) .... Take one tablet daily 2)  Sertraline Hcl 50 Mg Tabs (Sertraline hcl) .Marland Kitchen.. 1 by mouth qd 3)  Levothyroxine Sodium 112 Mcg Tabs (Levothyroxine sodium) .Marland Kitchen.. 1 tab daily except 1/2 pill tues, th  & sat 4)  Aspirin 81 Mg Tbec (Aspirin) .... Once daily 5)  Clonazepam 1 Mg Tabs (Clonazepam) .... Every 8 -12 hrs as needed only; not on regular schedule because of balance , sedation, habituation risks 6)  Calcium and Vit D  .... Once daily 7)  Pravachol 40 Mg Tabs (Pravastatin sodium) .Marland Kitchen.. 1 at bedtime 8)  Lorazepam 1 Mg Tabs (Lorazepam) .Marland Kitchen.. 1 by mouth two times a day-tid prn 9)  Nitroglycerin 0.3 Mg Subl (Nitroglycerin) .Marland Kitchen.. 1 sl prn 10)  Ascensia Breeze 2 Disk (Glucose blood) .... Use as directed once daily 11)  Neurontin 300 Mg Caps (Gabapentin) .Marland Kitchen.. 1 by mouth at bedtime  Other Orders: TLB-TSH (Thyroid Stimulating Hormone) (84443-TSH) TLB-T4 (Thyrox), Free [id number]) TLB-T3, Free (Triiodothyronine) (84481-T3FREE) UA Dipstick W/ Micro (manual) (96295)  Patient Instructions: 1)  Stop Smoking Tips: Choose a Quit date. Cut down before the Quit date. decide what you will do as a substitute when you feel the urge to smoke(gum,toothpick,exercise). 2)  Check your blood sugars regularly. If your readings are usually above :150 or below 90 you should contact our office. 3)  See your eye doctor yearly to check for diabetic eye damage. 4)  Check your feet each night for sore areas, calluses or signs of infection.   Orders Added: 1)  Est. Patient Level IV [04540] 2)  Venipuncture [36415] 3)  TLB-Lipid Panel [80061-LIPID] 4)  TLB-BMP (Basic Metabolic Panel-BMET) [80048-METABOL] 5)  TLB-CBC Platelet - w/Differential [85025-CBCD] 6)  TLB-Hepatic/Liver Function Pnl [80076-HEPATIC] 7)  TLB-TSH (Thyroid Stimulating Hormone) [84443-TSH] 8)  TLB-T4 (Thyrox), Free  [98119-JY7W] 9)  TLB-T3, Free (Triiodothyronine) [29562-Z3YQMV] 10)  TLB-A1C / Hgb A1C (Glycohemoglobin) [83036-A1C] 11)  Specimen Handling [99000] 12)  UA Dipstick W/ Micro (manual) [81000]    Laboratory Results   Urine Tests    Routine Urinalysis   Color: yellow Appearance: Clear Glucose: negative   (Normal Range: Negative) Bilirubin: negative   (Normal Range: Negative) Ketone: negative   (Normal Range: Negative) Spec. Gravity: <1.005   (Normal Range: 1.003-1.035) Blood: negative   (Normal Range: Negative) pH: 6.0   (Normal Range: 5.0-8.0) Protein: negative   (Normal Range: Negative) Urobilinogen: negative   (Normal Range: 0-1) Nitrite: negative   (Normal Range: Negative) Leukocyte Esterace: negative   (Normal Range: Negative)    Comments: Floydene Flock  July 22, 2010 11:30 AM

## 2010-08-18 NOTE — Progress Notes (Signed)
Summary: [facility] rxs  Phone Note Refill Request   Refills Requested: Medication #1:  LISINOPRIL 40 MG TABS Take one tablet daily  Medication #2:  SERTRALINE HCL 50 MG TABS 1 by mouth qd  Medication #3:  LEVOTHYROXINE SODIUM 112 MCG TABS 1 tab daily EXCEPT 1/2 pill Tues  Medication #4:  PRAVACHOL 40 MG  TABS 1 at bedtime Patient called to remind that these should be sent to [facility].  Initial call taken by: Lucious Groves CMA,  July 19, 2010 2:24 PM    Prescriptions: PRAVACHOL 40 MG  TABS (PRAVASTATIN SODIUM) 1 at bedtime  #90 x 2   Entered by:   Lucious Groves CMA   Authorized by:   Marga Melnick MD   Signed by:   Lucious Groves CMA on 07/19/2010   Method used:   Faxed to ...       [facility] (mail-order)             , [place]         Ph: [phone]       Fax: [phone]   RxID:   [id number] LEVOTHYROXINE SODIUM 112 MCG TABS (LEVOTHYROXINE SODIUM) 1 tab daily EXCEPT 1/2 pill Tues, Th  & Sat  #90 x 2   Entered by:   Lucious Groves CMA   Authorized by:   Marga Melnick MD   Signed by:   Lucious Groves CMA on 07/19/2010   Method used:   Faxed to ...       [facility] (mail-order)             , [place]         Ph: [phone]       Fax: [phone]   RxID:   [id number] SERTRALINE HCL 50 MG TABS (SERTRALINE HCL) 1 by mouth qd  #90 x 2   Entered by:   Lucious Groves CMA   Authorized by:   Marga Melnick MD   Signed by:   Lucious Groves CMA on 07/19/2010   Method used:   Faxed to ...       [facility] (mail-order)             , [place]         Ph: [phone]       Fax: [phone]   RxID:   [id number] LISINOPRIL 40 MG TABS (LISINOPRIL) Take one tablet daily  #90 x 2   Entered by:   Lucious Groves CMA   Authorized by:   Marga Melnick MD   Signed by:   Lucious Groves CMA on 07/19/2010   Method used:   Faxed to ...       [facility] (mail-order)             , [place]         Ph: [phone]       Fax: [phone]   RxID:   [id number]

## 2010-08-18 NOTE — Assessment & Plan Note (Signed)
Summary: per check out/sf  Medications Added * CALCIUM AND VIT D 1 by mouth two times a day      Allergies Added:    Visit Type:  Follow-up Primary Provider:  Dr. Marga Melnick, M.D.    CC:  CAD.  History of Present Illness: The patient presents for yearly followup. Since I last saw her she has had no new cardiovascular complaints. She denies any chest pressure, neck or arm discomfort. She denies any palpitations, presyncope or syncope. She doesn't exercise routinely because she doesn't want to. She continues to smoke cigarettes because she wants to. She has had no new shortness of breath, PND or orthopnea. She has a smoker's cough productive of phlegm.  Current Medications (verified): 1)  Lisinopril 40 Mg Tabs (Lisinopril) .... Take One Tablet Daily 2)  Sertraline Hcl 50 Mg Tabs (Sertraline Hcl) .Marland Kitchen.. 1 By Mouth Qd 3)  Levothyroxine Sodium 112 Mcg Tabs (Levothyroxine Sodium) .Marland Kitchen.. 1 Tab Daily Except 1/2 Pill Tues, Th  & Sat 4)  Aspirin 81 Mg  Tbec (Aspirin) .... Once Daily 5)  Clonazepam 1 Mg Tabs (Clonazepam) .... Every 8 -12 Hrs As Needed Only; Not On Regular Schedule Because of Balance , Sedation, Habituation Risks 6)  Calcium and Vit D .... 1 By Mouth Two Times A Day 7)  Pravachol 40 Mg  Tabs (Pravastatin Sodium) .Marland Kitchen.. 1 At Bedtime 8)  Lorazepam 1 Mg  Tabs (Lorazepam) .Marland Kitchen.. 1 By Mouth Two Times A Day-Tid Prn 9)  Nitroglycerin 0.3 Mg  Subl (Nitroglycerin) .Marland Kitchen.. 1 Sl Prn 10)  Ascensia Breeze 2   Disk (Glucose Blood) .... Use As Directed Once Daily 11)  Neurontin 300 Mg Caps (Gabapentin) .Marland Kitchen.. 1 By Mouth At Bedtime  Allergies (verified): 1)  ! Silvadene  Past History:  Past Medical History: Reviewed history from 01/20/2010 and no changes required. Cigarette smoker Diabetes mellitus, type II, controlled Hyperlipidemia Hypertension Hyperthyroidism with Ophthalmologic complications CAD (MI 1993.Marland Kitchen  She had PTCA of an occluded right coronary.  Her last catheterization was in 1996.   At that time, the LAD had 20% proximal stenosis, 50% mid stenosis.  The left circumflex was normal.  The right coronary artery had 30% stenosis the previous PCA site.  The EF had mild hypokinesis of the inferior wall. ) Myasthenia gravis followed by Dr. Anne Hahn in neurology. Anemia-iron deficiency Right carotid stenosis (70% by angiogram 2009) Cervical spine (C2) fracture  Past Surgical History: Reviewed history from 01/20/2010 and no changes required. Colonoscopy negative  05/2000; 2009, Dr Milbert Coulter ,3rd degree, [facility] 2003 Colonoscopy polyps adenoma 11/2004 Cataract, bilat Hysterectomy dysfunctional menses; ? ovaries remain Foot surgery  Review of Systems       As stated in the HPI and negative for all other systems.   Vital Signs:  Patient profile:   77 year old female Height:      65.5 inches Weight:      127 pounds BMI:     20.89 Pulse rate:   65 / minute Resp:     16 per minute BP sitting:   142 / 86  (right arm)  Vitals Entered By: Marrion Coy, CNA (July 25, 2010 12:06 PM)  Physical Exam  General:  Well developed, well nourished, in no acute distress. Head:  normocephalic and atraumatic Eyes:  PERRLA/EOM intact; conjunctiva and lids normal. Neck:  No jugular venous distention, soft bilateral bruits, no thyromegaly Chest Wall:  Well-healed sternotomy scar Lungs:  Clear bilaterally to auscultation and percussion. Abdomen:  Bowel  sounds positive; abdomen soft and non-tender without masses, organomegaly, or hernias noted. No hepatosplenomegaly. Msk:  Back normal, normal gait. Muscle strength and tone normal. Extremities:  No clubbing or cyanosis. Neurologic:  Alert and oriented x 3. Skin:  Intact without lesions or rashes. Cervical Nodes:  no significant adenopathy Psych:  Normal affect.   Detailed Cardiovascular Exam  Neck    Carotids: Carotids full and equal bilaterally with bruits.      Neck Veins: Normal, no JVD.    Heart    Inspection: no  deformities or lifts noted.      Palpation: normal PMI with no thrills palpable.      Auscultation: regular rate and rhythm, S1, S2 without murmurs, rubs, gallops, or clicks.    Vascular    Abdominal Aorta: no palpable masses, pulsations, positive audible midline bruit    Femoral Pulses: normal femoral pulses bilaterally.      Pedal Pulses: R and L carotid,radial  and posterior tibial pulses are full and equal bilaterally.L> R carotid bruits. Decreased DPP    Radial Pulses: normal radial pulses bilaterally.      Peripheral Circulation: no clubbing, cyanosis, or edema noted with normal capillary refill.     EKG  Procedure date:  07/25/2010  Findings:      Sinus rhythm, rate 58, left atrial enlargement, no acute ST-T wave changes  Impression & Recommendations:  Problem # 1:  C A D (ICD-414.00) She has no new symptoms consistent with angina. Her last stress test was in 2009. She needs continued risk reduction. Orders: EKG w/ Interpretation (93000)  Problem # 2:  CAROTID ARTERY STENOSIS (ICD-433.10) She is overdue for followup of his moderate bilateral carotid stenosis. I will arrange this. Orders: Carotid Duplex (Carotid Duplex)  Problem # 3:  CIGARETTE SMOKER (ICD-305.1) She has no desire to quit smoking  Problem # 4:  HYPERTENSION, ESSENTIAL NOS (ICD-401.9) Her blood pressure is controlled and she will continue meds as listed.  Problem # 5:  HYPERLIPIDEMIA (ICD-272.2) I reviewed the only other day by Dr. Alwyn Ren.  The LDL was 91 nad the HDL 76.6.  This is an excellent ratio and she will continue with meds as listed.  Patient Instructions: 1)  Your physician recommends that you schedule a follow-up appointment in: 12 months with Dr Antoine Poche 2)  Your physician recommends that you continue on your current medications as directed. Please refer to the Current Medication list given to you today. 3)  Your physician has requested that you have a carotid duplex. This test is an  ultrasound of the carotid arteries in your neck. It looks at blood flow through these arteries that supply the brain with blood. Allow one hour for this exam. There are no restrictions or special instructions.

## 2010-08-22 ENCOUNTER — Encounter: Payer: Self-pay | Admitting: Internal Medicine

## 2010-08-22 ENCOUNTER — Telehealth (INDEPENDENT_AMBULATORY_CARE_PROVIDER_SITE_OTHER): Payer: Self-pay | Admitting: *Deleted

## 2010-08-24 NOTE — Progress Notes (Signed)
Summary: [facility] rx's  Phone Note Refill Request Call back at Home Phone [phone] Message from:  Patient on August 16, 2010 10:19 AM  Refills Requested: Medication #1:  LORAZEPAM 1 MG  TABS 1 by mouth two times a day-tid prn   Supply Requested: 90 day  Medication #2:  CLONAZEPAM 1 MG TABS every 8 -12 hrs as needed only; NOT on regular schedule because of balance   Supply Requested: 90 day Patient would like the above prescription's sent to [facility], please advise of refills.   Initial call taken by: Lucious Groves CMA,  August 16, 2010 10:20 AM  Follow-up for Phone Call        these are as needed meds , not meds taken on a routine or maintenance basis. I need to know how often she is taking these. Follow-up by: Marga Melnick MD,  August 16, 2010 1:01 PM  Additional Follow-up for Phone Call Additional follow up Details #1::        Pt states that she currently take LORAZEPAM 1 tab daily every day but sometime she does take additional  tabs if needed. CLONAZEPAM- Pt take just about every night but there are some night that she does not take med. Pt states that when she does not take med she tosses and turns all night....Marland KitchenMarland KitchenFelecia Deloach CMA  August 16, 2010 5:16 PM     Additional Follow-up for Phone Call Additional follow up Details #2::    #90 of each ; label Lorazepam once daily as needed & Clonazepam at bedtime prn Follow-up by: Marga Melnick MD,  August 16, 2010 6:25 PM  Additional Follow-up for Phone Call Additional follow up Details #3:: Details for Additional Follow-up Action Taken: Patient notified.  Additional Follow-up by: Lucious Groves CMA,  August 17, 2010 11:53 AM  New/Updated Medications: CLONAZEPAM 1 MG TABS (CLONAZEPAM) 1 by mouth at bedtime as needed only LORAZEPAM 1 MG  TABS (LORAZEPAM) 1 by mouth once daily as needed Prescriptions: LORAZEPAM 1 MG  TABS (LORAZEPAM) 1 by mouth once daily as needed  #90 x 0   Entered by:   Lucious Groves CMA   Authorized by:    Marga Melnick MD   Signed by:   Lucious Groves CMA on 08/17/2010   Method used:   Printed then faxed to ...       [facility] (mail-order)             , [place]         Ph: [phone]       Fax: [phone]   RxID:   [id number] CLONAZEPAM 1 MG TABS (CLONAZEPAM) 1 by mouth at bedtime as needed only  #90 x 0   Entered by:   Lucious Groves CMA   Authorized by:   Marga Melnick MD   Signed by:   Lucious Groves CMA on 08/17/2010   Method used:   Printed then faxed to ...       [facility] (mail-order)             , [place]         Ph: [phone]       Fax: [phone]   RxID:   [id number]

## 2010-09-01 NOTE — Progress Notes (Addendum)
Summary: Refill Request  Phone Note Refill Request Call back at [phone] Message from:  Pharmacy on August 22, 2010 9:05 AM  Refills Requested: Medication #1:  LORAZEPAM 1 MG  TABS 1 by mouth once daily as needed   Dosage confirmed as above?Dosage Confirmed   Supply Requested: 60   Last Ref[facility]: [facility] [address]   Next Appointment Scheduled: none Initial call taken by: Harold Barban,  August 22, 2010 9:06 AM  Follow-up for Phone Call        Patient's state [facility] called her and said they can not fill Lorazepam for her and she needs to fill at a local pharmacy. Follow-up by: Shonna Chock CMA,  August 22, 2010 3:24 PM    Prescriptions: LORAZEPAM 1 MG  TABS (LORAZEPAM) 1 by mouth once daily as needed  #90 x 0   Entered by:   Shonna Chock CMA   Authorized by:   Marga Melnick MD   Signed by:   Shonna Chock CMA on 08/22/2010   Method used:   Printed then faxed to ...       [facility]  [address] [id number]       [address]       [place], [place]  [address]       Ph: [phone] or [phone]       Fax: [phone]   RxID:   [id number]   Appended Document: Refill Request Patient called about this prescription and was made aware that it was taken care of. Faxed pharmacy to have prescription filled for the patient.

## 2010-09-05 ENCOUNTER — Telehealth (INDEPENDENT_AMBULATORY_CARE_PROVIDER_SITE_OTHER): Payer: Self-pay | Admitting: *Deleted

## 2010-09-07 NOTE — Letter (Signed)
Summary: Self Health Assessment/[employer]  Self Health Assessment/[employer]   Imported By: Maryln Gottron 09/01/2010 08:42:21  _____________________________________________________________________  External Attachment:    Type:   Image     Comment:   External Document

## 2010-09-13 NOTE — Progress Notes (Signed)
Summary: refill  Phone Note Refill Request Message from:  Fax from Pharmacy on September 05, 2010 11:52 AM  Refills Requested: Medication #1:  LORAZEPAM 1 MG  TABS 1 by mouth once daily as needed [facility] [address] - fax [phone] --- refill sent [phone] -- received another request  Initial call taken by: Okey Regal Spring,  September 05, 2010 11:53 AM  Follow-up for Phone Call        I spoke with patient and she the pharmacy never received rx on 08/22/10.  I called the pharmacy and spoke with [facility] and she said rx was filled and waiting for pick up.  I called patient back and she ok'd Follow-up by: Shonna Chock CMA,  September 05, 2010 12:02 PM

## 2010-09-19 ENCOUNTER — Telehealth (INDEPENDENT_AMBULATORY_CARE_PROVIDER_SITE_OTHER): Payer: Self-pay | Admitting: *Deleted

## 2010-09-27 NOTE — Progress Notes (Signed)
Summary: refill  Phone Note Refill Request Message from:  Fax from Pharmacy on September 19, 2010 11:16 AM  Refills Requested: Medication #1:  CLONAZEPAM 1 MG TABS 1 by mouth at bedtime as needed only [facility] [address] - fax [phone]  Initial call taken by: Okey Regal Spring,  September 19, 2010 11:17 AM    Prescriptions: CLONAZEPAM 1 MG TABS (CLONAZEPAM) 1 by mouth at bedtime as needed only  #90 x 0   Entered by:   Shonna Chock CMA   Authorized by:   Marga Melnick MD   Signed by:   Shonna Chock CMA on 09/19/2010   Method used:   Printed then faxed to ...       [facility]  [address] [id number]       [address]       [place], [place]  [address]       Ph: [phone] or [phone]       Fax: [phone]   RxID:   [id number]

## 2010-10-24 LAB — CBC
HCT: 31.3 % — ABNORMAL LOW (ref 36.0–46.0)
HCT: 31.8 % — ABNORMAL LOW (ref 36.0–46.0)
HCT: 34.8 % — ABNORMAL LOW (ref 36.0–46.0)
Hemoglobin: 10.5 g/dL — ABNORMAL LOW (ref 12.0–15.0)
Hemoglobin: 10.7 g/dL — ABNORMAL LOW (ref 12.0–15.0)
Hemoglobin: 11.8 g/dL — ABNORMAL LOW (ref 12.0–15.0)
MCHC: 33 g/dL (ref 30.0–36.0)
MCHC: 34.1 g/dL (ref 30.0–36.0)
MCHC: 34.3 g/dL (ref 30.0–36.0)
MCV: 84.5 fL (ref 78.0–100.0)
MCV: 85.2 fL (ref 78.0–100.0)
MCV: 85.3 fL (ref 78.0–100.0)
Platelets: 153 10*3/uL (ref 150–400)
Platelets: 197 10*3/uL (ref 150–400)
Platelets: 199 10*3/uL (ref 150–400)
RBC: 3.68 MIL/uL — ABNORMAL LOW (ref 3.87–5.11)
RBC: 3.73 MIL/uL — ABNORMAL LOW (ref 3.87–5.11)
RBC: 4.11 MIL/uL (ref 3.87–5.11)
RDW: 15.9 % — ABNORMAL HIGH (ref 11.5–15.5)
RDW: 16.2 % — ABNORMAL HIGH (ref 11.5–15.5)
RDW: 16.3 % — ABNORMAL HIGH (ref 11.5–15.5)
WBC: 11.5 10*3/uL — ABNORMAL HIGH (ref 4.0–10.5)
WBC: 7.2 10*3/uL (ref 4.0–10.5)
WBC: 9.9 10*3/uL (ref 4.0–10.5)

## 2010-10-24 LAB — GLUCOSE, CAPILLARY
Glucose-Capillary: 101 mg/dL — ABNORMAL HIGH (ref 70–99)
Glucose-Capillary: 103 mg/dL — ABNORMAL HIGH (ref 70–99)
Glucose-Capillary: 103 mg/dL — ABNORMAL HIGH (ref 70–99)
Glucose-Capillary: 105 mg/dL — ABNORMAL HIGH (ref 70–99)
Glucose-Capillary: 108 mg/dL — ABNORMAL HIGH (ref 70–99)
Glucose-Capillary: 109 mg/dL — ABNORMAL HIGH (ref 70–99)
Glucose-Capillary: 111 mg/dL — ABNORMAL HIGH (ref 70–99)
Glucose-Capillary: 116 mg/dL — ABNORMAL HIGH (ref 70–99)
Glucose-Capillary: 122 mg/dL — ABNORMAL HIGH (ref 70–99)
Glucose-Capillary: 86 mg/dL (ref 70–99)
Glucose-Capillary: 91 mg/dL (ref 70–99)
Glucose-Capillary: 99 mg/dL (ref 70–99)

## 2010-10-24 LAB — POCT I-STAT, CHEM 8
BUN: 11 mg/dL (ref 6–23)
Calcium, Ion: 0.99 mmol/L — ABNORMAL LOW (ref 1.12–1.32)
Chloride: 105 mEq/L (ref 96–112)
Creatinine, Ser: 0.8 mg/dL (ref 0.4–1.2)
Glucose, Bld: 130 mg/dL — ABNORMAL HIGH (ref 70–99)
HCT: 36 % (ref 36.0–46.0)
Hemoglobin: 12.2 g/dL (ref 12.0–15.0)
Potassium: 4.4 mEq/L (ref 3.5–5.1)
Sodium: 133 mEq/L — ABNORMAL LOW (ref 135–145)
TCO2: 21 mmol/L (ref 0–100)

## 2010-10-24 LAB — COMPREHENSIVE METABOLIC PANEL
ALT: 15 U/L (ref 0–35)
ALT: 18 U/L (ref 0–35)
AST: 34 U/L (ref 0–37)
AST: 35 U/L (ref 0–37)
Albumin: 3.1 g/dL — ABNORMAL LOW (ref 3.5–5.2)
Albumin: 3.2 g/dL — ABNORMAL LOW (ref 3.5–5.2)
Alkaline Phosphatase: 52 U/L (ref 39–117)
Alkaline Phosphatase: 52 U/L (ref 39–117)
BUN: 8 mg/dL (ref 6–23)
BUN: 8 mg/dL (ref 6–23)
CO2: 24 mEq/L (ref 19–32)
CO2: 24 mEq/L (ref 19–32)
Calcium: 8 mg/dL — ABNORMAL LOW (ref 8.4–10.5)
Calcium: 8.1 mg/dL — ABNORMAL LOW (ref 8.4–10.5)
Chloride: 106 mEq/L (ref 96–112)
Chloride: 107 mEq/L (ref 96–112)
Creatinine, Ser: 0.73 mg/dL (ref 0.4–1.2)
Creatinine, Ser: 0.84 mg/dL (ref 0.4–1.2)
GFR calc Af Amer: >60 mL/min (ref >60)
GFR calc Af Amer: >60 mL/min (ref >60)
GFR calc non Af Amer: >60 mL/min (ref >60)
GFR calc non Af Amer: >60 mL/min (ref >60)
Glucose, Bld: 103 mg/dL — ABNORMAL HIGH (ref 70–99)
Glucose, Bld: 113 mg/dL — ABNORMAL HIGH (ref 70–99)
Potassium: 3.7 mEq/L (ref 3.5–5.1)
Potassium: 3.8 mEq/L (ref 3.5–5.1)
Sodium: 137 mEq/L (ref 135–145)
Sodium: 137 mEq/L (ref 135–145)
Total Bilirubin: 0.4 mg/dL (ref 0.3–1.2)
Total Bilirubin: 0.5 mg/dL (ref 0.3–1.2)
Total Protein: 5.4 g/dL — ABNORMAL LOW (ref 6.0–8.3)
Total Protein: 5.4 g/dL — ABNORMAL LOW (ref 6.0–8.3)

## 2010-10-24 LAB — CARDIAC PANEL(CRET KIN+CKTOT+MB+TROPI)
CK, MB: 11.9 ng/mL — ABNORMAL HIGH (ref 0.3–4.0)
CK, MB: 17.6 ng/mL — ABNORMAL HIGH (ref 0.3–4.0)
Relative Index: 2.5 (ref 0.0–2.5)
Relative Index: 3.2 — ABNORMAL HIGH (ref 0.0–2.5)
Total CK: 470 U/L — ABNORMAL HIGH (ref 7–177)
Total CK: 549 U/L — ABNORMAL HIGH (ref 7–177)
Troponin I: 0.01 ng/mL (ref 0.00–0.06)
Troponin I: 0.02 ng/mL (ref 0.00–0.06)

## 2010-10-24 LAB — TYPE AND SCREEN
ABO/RH(D): A NEG
Antibody Screen: NEGATIVE

## 2010-10-24 LAB — PROTIME-INR
INR: 0.9 (ref 0.00–1.49)
Prothrombin Time: 12.7 seconds (ref 11.6–15.2)

## 2010-10-24 LAB — LACTIC ACID, PLASMA: Lactic Acid, Venous: 1.8 mmol/L (ref 0.5–2.2)

## 2010-10-24 LAB — TSH: TSH: 0.936 u[IU]/mL (ref 0.350–4.500)

## 2010-10-24 LAB — ABO/RH: ABO/RH(D): A NEG

## 2010-10-24 LAB — ETHANOL: Alcohol, Ethyl (B): <5 mg/dL (ref 0–10)

## 2010-10-24 LAB — MAGNESIUM
Magnesium: 2.2 mg/dL (ref 1.5–2.5)
Magnesium: 2.3 mg/dL (ref 1.5–2.5)

## 2010-10-27 ENCOUNTER — Encounter: Payer: Self-pay | Admitting: Internal Medicine

## 2010-10-27 ENCOUNTER — Ambulatory Visit (INDEPENDENT_AMBULATORY_CARE_PROVIDER_SITE_OTHER): Payer: Medicare Other | Admitting: Internal Medicine

## 2010-10-27 VITALS — BP 94/68 | HR 64 | Temp 97.9°F | Wt 129.6 lb

## 2010-10-27 DIAGNOSIS — E039 Hypothyroidism, unspecified: Secondary | ICD-10-CM

## 2010-10-27 DIAGNOSIS — R569 Unspecified convulsions: Secondary | ICD-10-CM

## 2010-10-27 DIAGNOSIS — I10 Essential (primary) hypertension: Secondary | ICD-10-CM

## 2010-10-27 DIAGNOSIS — J438 Other emphysema: Secondary | ICD-10-CM

## 2010-10-27 DIAGNOSIS — E785 Hyperlipidemia, unspecified: Secondary | ICD-10-CM

## 2010-10-27 DIAGNOSIS — E559 Vitamin D deficiency, unspecified: Secondary | ICD-10-CM

## 2010-10-27 DIAGNOSIS — D509 Iron deficiency anemia, unspecified: Secondary | ICD-10-CM

## 2010-10-27 DIAGNOSIS — R55 Syncope and collapse: Secondary | ICD-10-CM

## 2010-10-27 DIAGNOSIS — E119 Type 2 diabetes mellitus without complications: Secondary | ICD-10-CM

## 2010-10-27 DIAGNOSIS — I2510 Atherosclerotic heart disease of native coronary artery without angina pectoris: Secondary | ICD-10-CM

## 2010-10-27 LAB — CBC WITH DIFFERENTIAL/PLATELET
Basophils Absolute: 0 10*3/uL (ref 0.0–0.1)
Basophils Relative: 0.6 % (ref 0.0–3.0)
Eosinophils Absolute: 0.1 10*3/uL (ref 0.0–0.7)
Eosinophils Relative: 1.1 % (ref 0.0–5.0)
HCT: 31.6 % — ABNORMAL LOW (ref 36.0–46.0)
Hemoglobin: 10.6 g/dL — ABNORMAL LOW (ref 12.0–15.0)
Lymphocytes Relative: 27.8 % (ref 12.0–46.0)
Lymphs Abs: 2.1 10*3/uL (ref 0.7–4.0)
MCHC: 33.7 g/dL (ref 30.0–36.0)
MCV: 77.4 fl — ABNORMAL LOW (ref 78.0–100.0)
Monocytes Absolute: 0.5 10*3/uL (ref 0.1–1.0)
Monocytes Relative: 6.8 % (ref 3.0–12.0)
Neutro Abs: 4.9 10*3/uL (ref 1.4–7.7)
Neutrophils Relative %: 63.7 % (ref 43.0–77.0)
Platelets: 213 10*3/uL (ref 150.0–400.0)
RBC: 4.09 Mil/uL (ref 3.87–5.11)
RDW: 16.3 % — ABNORMAL HIGH (ref 11.5–14.6)
WBC: 7.7 10*3/uL (ref 4.5–10.5)

## 2010-10-27 LAB — HEPATIC FUNCTION PANEL
ALT: 28 U/L (ref 0–35)
AST: 63 U/L — ABNORMAL HIGH (ref 0–37)
Albumin: 3.8 g/dL (ref 3.5–5.2)
Alkaline Phosphatase: 69 U/L (ref 39–117)
Bilirubin, Direct: 0.1 mg/dL (ref 0.0–0.3)
Total Bilirubin: 0.3 mg/dL (ref 0.3–1.2)
Total Protein: 6.6 g/dL (ref 6.0–8.3)

## 2010-10-27 LAB — HEMOGLOBIN A1C: Hgb A1c MFr Bld: 6.2 % (ref 4.6–6.5)

## 2010-10-27 LAB — TSH: TSH: 1.99 u[IU]/mL (ref 0.35–5.50)

## 2010-10-27 LAB — BASIC METABOLIC PANEL
BUN: 16 mg/dL (ref 6–23)
CO2: 25 mEq/L (ref 19–32)
Calcium: 8.8 mg/dL (ref 8.4–10.5)
Chloride: 99 mEq/L (ref 96–112)
Creatinine, Ser: 0.9 mg/dL (ref 0.4–1.2)
GFR: 69.25 mL/min (ref >60.00)
Glucose, Bld: 85 mg/dL (ref 70–99)
Potassium: 4.6 mEq/L (ref 3.5–5.1)
Sodium: 134 mEq/L — ABNORMAL LOW (ref 135–145)
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
## 2010-10-27 NOTE — Patient Instructions (Addendum)
Monitor your blood pressure frequently through the week. Your goal is an average less than 135/85. Avoid any activities which might result in injury should you have syncope again; examples would be climbing any ladders  or working with sharp tools.  Go to the emergency room with a copy of this note should you have syncope (passing out) again

## 2010-10-30 NOTE — Progress Notes (Signed)
  Subjective:    Patient ID: Brandi Raymond, female    DOB: [birth year], 77 y.o.   MRN: [id number]  HPI    Review of Systems     Objective:   Physical Exam        Assessment & Plan:  This encounter  encompassed 55 minutes ; > 50 % spent assessing risk & options & counseling her &  her husband.as to these issues.

## 2010-11-01 NOTE — Progress Notes (Signed)
  Subjective:    Patient ID: Brandi Raymond, female    DOB: [birth year], 77 y.o.   MRN: [id number]  HPI    Review of Systems     Objective:   Physical Exam        Assessment & Plan:  "Total time encompassed  55 minutes, greater than 50%  spent counseling patient and coordinating care for problems addressed at this encounter".

## 2010-11-16 ENCOUNTER — Other Ambulatory Visit: Payer: Self-pay

## 2010-11-16 MED ORDER — LORAZEPAM 1 MG PO TABS: 1.0000 mg | ORAL_TABLET | Freq: Every day | ORAL | Status: DC | PRN
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
## 2010-12-09 ENCOUNTER — Ambulatory Visit (INDEPENDENT_AMBULATORY_CARE_PROVIDER_SITE_OTHER): Payer: Medicare Other | Admitting: Family Medicine

## 2010-12-09 DIAGNOSIS — N9089 Other specified noninflammatory disorders of vulva and perineum: Secondary | ICD-10-CM

## 2010-12-09 DIAGNOSIS — B369 Superficial mycosis, unspecified: Secondary | ICD-10-CM

## 2010-12-09 DIAGNOSIS — N907 Vulvar cyst: Secondary | ICD-10-CM

## 2010-12-09 MED ORDER — NYSTATIN-TRIAMCINOLONE 100000-0.1 UNIT/GM-% EX OINT: TOPICAL_OINTMENT | Freq: Two times a day (BID) | CUTANEOUS | Status: AC

## 2010-12-09 NOTE — Progress Notes (Signed)
  Subjective:    Patient ID: Brandi Raymond, female    DOB: 09-14-1933, 77 y.o.   MRN: [id number]  HPI Vaginal itching- reports this is better but for 4 days 'i was itching and burning so bad i about drove myself crazy'.  Has 'nodule' on labia- 'it doesn't bother me except maybe after sex'.  Breast rash- sxs started Sunday.  Red areas under and between breasts, very itchy.  Has used cortisone cream w/out relief.   Review of Systems For ROS see HPI     Objective:   Physical Exam  Constitutional: She appears well-developed and well-nourished. No distress.  Genitourinary:       Small inclusion cyst on L labia majora No evidence of vaginal discharge or yeast  Skin: Skin is warm and dry.       Erythematous rash between and under breasts bilaterally consistent w/ fungal dermatitis.          Assessment & Plan:

## 2010-12-09 NOTE — Patient Instructions (Signed)
The breast rash is a yeast- use the Mycolog ointment twice daily until things improve Take benadryl as needed for itching If you again develop vaginal symptoms- use over the counter miconazole cream Call with any questions or concerns Hang in there! Have a great holiday weekend!

## 2010-12-13 DIAGNOSIS — B369 Superficial mycosis, unspecified: Secondary | ICD-10-CM | POA: Insufficient documentation

## 2010-12-13 DIAGNOSIS — N907 Vulvar cyst: Secondary | ICD-10-CM | POA: Insufficient documentation

## 2010-12-13 NOTE — Assessment & Plan Note (Signed)
Benign appearing.  No need for concern.  Advised pt that if area become painful, enlarges in size or other concerns she should see her GYN.  Pt expressed understanding and is in agreement w/ plan.

## 2010-12-13 NOTE — Assessment & Plan Note (Signed)
Start steroid/antifungal cream for sxs relief.  Reviewed supportive care and red flags that should prompt return.  Pt expressed understanding and is in agreement w/ plan.

## 2010-12-15 ENCOUNTER — Other Ambulatory Visit: Payer: Medicare Other

## 2010-12-15 ENCOUNTER — Other Ambulatory Visit: Payer: Self-pay | Admitting: Internal Medicine

## 2010-12-15 DIAGNOSIS — D649 Anemia, unspecified: Secondary | ICD-10-CM

## 2010-12-15 DIAGNOSIS — R7402 Elevation of levels of lactic acid dehydrogenase (LDH): Secondary | ICD-10-CM

## 2010-12-15 DIAGNOSIS — R7401 Elevation of levels of liver transaminase levels: Secondary | ICD-10-CM

## 2010-12-16 ENCOUNTER — Other Ambulatory Visit (INDEPENDENT_AMBULATORY_CARE_PROVIDER_SITE_OTHER): Payer: Medicare Other

## 2010-12-16 DIAGNOSIS — Z79899 Other long term (current) drug therapy: Secondary | ICD-10-CM

## 2010-12-16 DIAGNOSIS — D649 Anemia, unspecified: Secondary | ICD-10-CM

## 2010-12-16 DIAGNOSIS — R7402 Elevation of levels of lactic acid dehydrogenase (LDH): Secondary | ICD-10-CM

## 2010-12-16 DIAGNOSIS — R7401 Elevation of levels of liver transaminase levels: Secondary | ICD-10-CM

## 2010-12-16 LAB — IBC PANEL
Iron: 145 ug/dL (ref 42–145)
Saturation Ratios: 38.8 % (ref 20.0–50.0)
Transferrin: 267 mg/dL (ref 212.0–360.0)

## 2010-12-16 LAB — SODIUM: Sodium: 142 mEq/L (ref 135–145)

## 2010-12-16 LAB — VITAMIN B12: Vitamin B-12: 270 pg/mL (ref 211–911)

## 2010-12-16 LAB — FOLATE: Folate: 21 ng/mL (ref >5.9)

## 2010-12-16 NOTE — Progress Notes (Signed)
Lab drawn.

## 2010-12-26 ENCOUNTER — Other Ambulatory Visit: Payer: Self-pay | Admitting: Internal Medicine

## 2010-12-26 MED ORDER — CLONAZEPAM 1 MG PO TABS: 1.0000 mg | ORAL_TABLET | Freq: Every evening | ORAL | Status: DC | PRN

## 2010-12-26 NOTE — Telephone Encounter (Signed)
done

## 2011-01-30 ENCOUNTER — Telehealth: Payer: Self-pay | Admitting: *Deleted

## 2011-01-30 ENCOUNTER — Telehealth: Payer: Self-pay | Admitting: Cardiology

## 2011-01-30 NOTE — Telephone Encounter (Signed)
Pt rtn call from last week re a carotid, pt didn't understand message or who called, does she just need to schedule?

## 2011-01-30 NOTE — Telephone Encounter (Signed)
It appears that pt is due for a 6 month repeat carotid doppler.  Will forward information to Ericka Pontiff to schedule.

## 2011-01-30 NOTE — Telephone Encounter (Signed)
Pt called wondering when she should have labs again. MD recommendations are on April and June labs. Please call pt to schedule appt.

## 2011-02-02 NOTE — Telephone Encounter (Signed)
Patient has lab appt for 7/24 for AST, ALT, and CBCD; then scheduled a1c and b12 level for 05/09/2011

## 2011-02-06 ENCOUNTER — Other Ambulatory Visit: Payer: Self-pay | Admitting: Internal Medicine

## 2011-02-06 DIAGNOSIS — D649 Anemia, unspecified: Secondary | ICD-10-CM

## 2011-02-06 DIAGNOSIS — R7401 Elevation of levels of liver transaminase levels: Secondary | ICD-10-CM

## 2011-02-06 DIAGNOSIS — R7402 Elevation of levels of lactic acid dehydrogenase (LDH): Secondary | ICD-10-CM

## 2011-02-07 ENCOUNTER — Other Ambulatory Visit: Payer: Self-pay | Admitting: Cardiology

## 2011-02-07 ENCOUNTER — Other Ambulatory Visit (INDEPENDENT_AMBULATORY_CARE_PROVIDER_SITE_OTHER): Payer: Medicare Other

## 2011-02-07 DIAGNOSIS — D649 Anemia, unspecified: Secondary | ICD-10-CM

## 2011-02-07 DIAGNOSIS — R7402 Elevation of levels of lactic acid dehydrogenase (LDH): Secondary | ICD-10-CM

## 2011-02-07 DIAGNOSIS — R7401 Elevation of levels of liver transaminase levels: Secondary | ICD-10-CM

## 2011-02-07 DIAGNOSIS — I6529 Occlusion and stenosis of unspecified carotid artery: Secondary | ICD-10-CM

## 2011-02-07 LAB — CBC WITH DIFFERENTIAL/PLATELET
Basophils Absolute: 0 10*3/uL (ref 0.0–0.1)
Basophils Relative: 0.6 % (ref 0.0–3.0)
Eosinophils Absolute: 0.2 10*3/uL (ref 0.0–0.7)
Eosinophils Relative: 3.5 % (ref 0.0–5.0)
HCT: 38.7 % (ref 36.0–46.0)
Hemoglobin: 12.8 g/dL (ref 12.0–15.0)
Lymphocytes Relative: 35.4 % (ref 12.0–46.0)
Lymphs Abs: 2.2 10*3/uL (ref 0.7–4.0)
MCHC: 33 g/dL (ref 30.0–36.0)
MCV: 86.6 fl (ref 78.0–100.0)
Monocytes Absolute: 0.6 10*3/uL (ref 0.1–1.0)
Monocytes Relative: 10 % (ref 3.0–12.0)
Neutro Abs: 3.1 10*3/uL (ref 1.4–7.7)
Neutrophils Relative %: 50.5 % (ref 43.0–77.0)
Platelets: 172 10*3/uL (ref 150.0–400.0)
RBC: 4.46 Mil/uL (ref 3.87–5.11)
RDW: 16.1 % — ABNORMAL HIGH (ref 11.5–14.6)
WBC: 6.1 10*3/uL (ref 4.5–10.5)

## 2011-02-07 LAB — ALT: ALT: 13 U/L (ref 0–35)

## 2011-02-07 LAB — AST: AST: 25 U/L (ref 0–37)

## 2011-02-07 NOTE — Progress Notes (Signed)
Labs only

## 2011-02-10 ENCOUNTER — Encounter (INDEPENDENT_AMBULATORY_CARE_PROVIDER_SITE_OTHER): Payer: Medicare Other | Admitting: *Deleted

## 2011-02-10 DIAGNOSIS — I6529 Occlusion and stenosis of unspecified carotid artery: Secondary | ICD-10-CM

## 2011-02-13 ENCOUNTER — Encounter: Payer: Self-pay | Admitting: Cardiology

## 2011-02-20 ENCOUNTER — Other Ambulatory Visit: Payer: Self-pay | Admitting: Internal Medicine

## 2011-02-21 MED ORDER — LORAZEPAM 1 MG PO TABS: 1.0000 mg | ORAL_TABLET | Freq: Every day | ORAL | Status: DC | PRN

## 2011-02-21 NOTE — Telephone Encounter (Signed)
RX sent to pharmacy  

## 2011-02-27 ENCOUNTER — Other Ambulatory Visit: Payer: Self-pay | Admitting: Internal Medicine

## 2011-02-28 MED ORDER — CLONAZEPAM 1 MG PO TABS: 1.0000 mg | ORAL_TABLET | Freq: Every evening | ORAL | Status: DC | PRN

## 2011-02-28 NOTE — Telephone Encounter (Signed)
OK X1 

## 2011-02-28 NOTE — Telephone Encounter (Signed)
Dr.Hopper please advise, Clonazepam last filled 12/26/10 # 30/1 refill, Lorazepam filled on 02/21/2011 #90

## 2011-03-27 ENCOUNTER — Other Ambulatory Visit: Payer: Self-pay | Admitting: Internal Medicine

## 2011-03-27 MED ORDER — CLONAZEPAM 1 MG PO TABS: ORAL_TABLET | ORAL | Status: DC

## 2011-03-27 NOTE — Telephone Encounter (Signed)
RX sent in

## 2011-04-12 LAB — COMPREHENSIVE METABOLIC PANEL
ALT: 15
AST: 23
Albumin: 3.2 — ABNORMAL LOW
Alkaline Phosphatase: 57
BUN: 8
CO2: 24
Calcium: 8.5
Chloride: 101
Creatinine, Ser: 0.84
GFR calc Af Amer: >60
GFR calc non Af Amer: >60
Glucose, Bld: 92
Potassium: 4.2
Sodium: 135
Total Bilirubin: 0.6
Total Protein: 6.1

## 2011-04-12 LAB — CBC
HCT: 32.1 — ABNORMAL LOW
Hemoglobin: 10.7 — ABNORMAL LOW
MCHC: 33.5
MCV: 79.4
Platelets: 191
RBC: 4.04
RDW: 15.6 — ABNORMAL HIGH
WBC: 8.3

## 2011-04-12 LAB — POCT CARDIAC MARKERS
CKMB, poc: 1.2
Myoglobin, poc: 53.4
Operator id: 264031
Troponin i, poc: <0.05

## 2011-04-12 LAB — DIFFERENTIAL
Basophils Absolute: 0
Basophils Relative: 0
Eosinophils Absolute: 0.1
Eosinophils Relative: 1
Lymphocytes Relative: 27
Lymphs Abs: 2.3
Monocytes Absolute: 0.9
Monocytes Relative: 11
Neutro Abs: 5
Neutrophils Relative %: 60

## 2011-04-19 LAB — POCT I-STAT, CHEM 8
BUN: 18
Calcium, Ion: 1.09 — ABNORMAL LOW
Chloride: 102
Creatinine, Ser: 1.1
Glucose, Bld: 83
HCT: 34 — ABNORMAL LOW
Hemoglobin: 11.6 — ABNORMAL LOW
Potassium: 4.1
Sodium: 133 — ABNORMAL LOW
TCO2: 25

## 2011-04-19 LAB — DIFFERENTIAL
Basophils Absolute: 0
Basophils Relative: 0
Eosinophils Absolute: 0.2
Eosinophils Relative: 1
Lymphocytes Relative: 17
Lymphs Abs: 2.2
Monocytes Absolute: 1
Monocytes Relative: 8
Neutro Abs: 9.5 — ABNORMAL HIGH
Neutrophils Relative %: 73

## 2011-04-19 LAB — URINALYSIS, ROUTINE W REFLEX MICROSCOPIC
Bilirubin Urine: NEGATIVE
Glucose, UA: NEGATIVE
Hgb urine dipstick: NEGATIVE
Ketones, ur: NEGATIVE
Nitrite: NEGATIVE
Protein, ur: NEGATIVE
Specific Gravity, Urine: 1.01
Urobilinogen, UA: 1
pH: 7

## 2011-04-19 LAB — CBC
HCT: 33.9 — ABNORMAL LOW
Hemoglobin: 11.1 — ABNORMAL LOW
MCHC: 32.6
MCV: 80.2
Platelets: 275
RBC: 4.23
RDW: 15.7 — ABNORMAL HIGH
WBC: 13 — ABNORMAL HIGH

## 2011-04-19 LAB — POCT CARDIAC MARKERS
CKMB, poc: <1 — ABNORMAL LOW
Myoglobin, poc: 99.7
Troponin i, poc: <0.05

## 2011-04-28 ENCOUNTER — Other Ambulatory Visit: Payer: Self-pay | Admitting: Internal Medicine

## 2011-04-28 MED ORDER — LORAZEPAM 1 MG PO TABS: 1.0000 mg | ORAL_TABLET | Freq: Every day | ORAL | Status: DC | PRN

## 2011-05-03 ENCOUNTER — Other Ambulatory Visit: Payer: Self-pay | Admitting: Internal Medicine

## 2011-05-03 MED ORDER — CLONAZEPAM 1 MG PO TABS: ORAL_TABLET | ORAL | Status: DC

## 2011-05-03 NOTE — Telephone Encounter (Signed)
Dr. Alwyn Ren please advise on refill request for Klonopin (patient taking Klonopin and clonazepam). Med last filled 03/27/11 #30, last OV 10/27/10 with Dr.Hopper, pending appointment 05/09/11.

## 2011-05-03 NOTE — Telephone Encounter (Signed)
These are the same agent and can be refilled as the generic # 30

## 2011-05-03 NOTE — Telephone Encounter (Signed)
Patient taking Clonazepam and lorazepam. Per Dr.Hopper patient ok to be on both meds, meds should be taken at least 6 hours apart   RX sent

## 2011-05-08 ENCOUNTER — Other Ambulatory Visit: Payer: Self-pay | Admitting: Internal Medicine

## 2011-05-08 ENCOUNTER — Other Ambulatory Visit: Payer: Self-pay | Admitting: *Deleted

## 2011-05-08 DIAGNOSIS — E538 Deficiency of other specified B group vitamins: Secondary | ICD-10-CM

## 2011-05-08 DIAGNOSIS — D649 Anemia, unspecified: Secondary | ICD-10-CM

## 2011-05-08 DIAGNOSIS — E119 Type 2 diabetes mellitus without complications: Secondary | ICD-10-CM

## 2011-05-09 ENCOUNTER — Other Ambulatory Visit (INDEPENDENT_AMBULATORY_CARE_PROVIDER_SITE_OTHER): Payer: Medicare Other

## 2011-05-09 DIAGNOSIS — E538 Deficiency of other specified B group vitamins: Secondary | ICD-10-CM

## 2011-05-09 DIAGNOSIS — E119 Type 2 diabetes mellitus without complications: Secondary | ICD-10-CM

## 2011-05-09 DIAGNOSIS — D649 Anemia, unspecified: Secondary | ICD-10-CM

## 2011-05-09 LAB — VITAMIN B12: Vitamin B-12: 523 pg/mL (ref 211–911)

## 2011-05-09 LAB — HEMOGLOBIN A1C: Hgb A1c MFr Bld: 5.9 % (ref 4.6–6.5)

## 2011-05-16 ENCOUNTER — Other Ambulatory Visit: Payer: Self-pay

## 2011-05-16 MED ORDER — SERTRALINE HCL 50 MG PO TABS: 50.0000 mg | ORAL_TABLET | Freq: Every day | ORAL | Status: DC

## 2011-05-16 NOTE — Telephone Encounter (Signed)
RX sent

## 2011-06-02 ENCOUNTER — Other Ambulatory Visit: Payer: Self-pay

## 2011-06-02 MED ORDER — CLONAZEPAM 1 MG PO TABS: ORAL_TABLET | ORAL | Status: DC

## 2011-06-02 NOTE — Telephone Encounter (Signed)
RX sent per patient request.

## 2011-06-12 ENCOUNTER — Other Ambulatory Visit: Payer: Self-pay

## 2011-06-12 MED ORDER — PRAVASTATIN SODIUM 40 MG PO TABS: 40.0000 mg | ORAL_TABLET | Freq: Every day | ORAL | Status: DC

## 2011-06-12 NOTE — Telephone Encounter (Signed)
RX sent, patient will need Lipids/ Hep 272.4/995.20 in Jan 2013

## 2011-06-13 ENCOUNTER — Other Ambulatory Visit: Payer: Self-pay

## 2011-06-13 MED ORDER — PRAVASTATIN SODIUM 40 MG PO TABS: 40.0000 mg | ORAL_TABLET | Freq: Every day | ORAL | Status: DC

## 2011-07-24 ENCOUNTER — Other Ambulatory Visit: Payer: Self-pay | Admitting: Internal Medicine

## 2011-07-24 DIAGNOSIS — E785 Hyperlipidemia, unspecified: Secondary | ICD-10-CM

## 2011-07-24 DIAGNOSIS — T887XXA Unspecified adverse effect of drug or medicament, initial encounter: Secondary | ICD-10-CM

## 2011-07-25 ENCOUNTER — Other Ambulatory Visit (INDEPENDENT_AMBULATORY_CARE_PROVIDER_SITE_OTHER): Payer: Medicare Other

## 2011-07-25 DIAGNOSIS — E785 Hyperlipidemia, unspecified: Secondary | ICD-10-CM

## 2011-07-25 DIAGNOSIS — T887XXA Unspecified adverse effect of drug or medicament, initial encounter: Secondary | ICD-10-CM

## 2011-07-25 LAB — LIPID PANEL
Cholesterol: 168 mg/dL (ref 0–200)
HDL: 72.5 mg/dL (ref >39.00)
LDL Cholesterol: 84 mg/dL (ref 0–99)
Total CHOL/HDL Ratio: 2
Triglycerides: 58 mg/dL (ref 0.0–149.0)
VLDL: 11.6 mg/dL (ref 0.0–40.0)

## 2011-07-25 LAB — HEPATIC FUNCTION PANEL
ALT: 14 U/L (ref 0–35)
AST: 24 U/L (ref 0–37)
Albumin: 3.7 g/dL (ref 3.5–5.2)
Alkaline Phosphatase: 73 U/L (ref 39–117)
Bilirubin, Direct: 0.1 mg/dL (ref 0.0–0.3)
Total Bilirubin: 0.7 mg/dL (ref 0.3–1.2)
Total Protein: 6.9 g/dL (ref 6.0–8.3)

## 2011-07-25 NOTE — Progress Notes (Signed)
Labs only

## 2011-08-01 ENCOUNTER — Other Ambulatory Visit: Payer: Self-pay | Admitting: *Deleted

## 2011-08-01 MED ORDER — LORAZEPAM 1 MG PO TABS: 1.0000 mg | ORAL_TABLET | Freq: Every day | ORAL | Status: DC | PRN

## 2011-08-01 MED ORDER — CLONAZEPAM 1 MG PO TABS: ORAL_TABLET | ORAL | Status: DC

## 2011-08-01 NOTE — Telephone Encounter (Signed)
Pt left VM that she needs refill and that she has contacted the pharmacy on Friday and they advise her that they have yet to hear from [place].Pt did not leave pharmacy

## 2011-08-01 NOTE — Telephone Encounter (Signed)
Patient called back and left message that she would like [facility]

## 2011-08-01 NOTE — Telephone Encounter (Signed)
Left message on voicemail for patient to call and clarify pharmacy

## 2011-08-14 ENCOUNTER — Other Ambulatory Visit: Payer: Self-pay | Admitting: Internal Medicine

## 2011-08-15 MED ORDER — LISINOPRIL 40 MG PO TABS: 40.0000 mg | ORAL_TABLET | Freq: Every day | ORAL | Status: DC

## 2011-08-15 MED ORDER — PRAVASTATIN SODIUM 40 MG PO TABS: 40.0000 mg | ORAL_TABLET | Freq: Every day | ORAL | Status: DC

## 2011-08-15 MED ORDER — LEVOTHYROXINE SODIUM 112 MCG PO CAPS: ORAL_CAPSULE | ORAL | Status: DC

## 2011-08-15 NOTE — Telephone Encounter (Signed)
RX's sent to [facility]

## 2011-08-18 ENCOUNTER — Telehealth: Payer: Self-pay | Admitting: Internal Medicine

## 2011-08-18 NOTE — Telephone Encounter (Signed)
Opened in error

## 2011-08-21 ENCOUNTER — Other Ambulatory Visit: Payer: Self-pay | Admitting: Internal Medicine

## 2011-08-21 NOTE — Telephone Encounter (Signed)
I called [facility] to verify rx were received on 08/14/2011. I was informed rx's received and are in process

## 2011-08-24 ENCOUNTER — Other Ambulatory Visit: Payer: Self-pay | Admitting: Cardiology

## 2011-08-24 ENCOUNTER — Telehealth: Payer: Self-pay

## 2011-08-24 DIAGNOSIS — I6529 Occlusion and stenosis of unspecified carotid artery: Secondary | ICD-10-CM

## 2011-08-24 NOTE — Telephone Encounter (Signed)
Patient called stating she received letter from [facility] and they are unable to process meds because they are waiting to hear from [place].  I called [facility], they have two things holding patient's rx'up: 1.) Patient's DOB is listed as [id number] and rx's we sent in state [birth year], patient will need to contact insurance company and have this cleared up in order for records to be changed.  2.) We sent in rx for Lisinopril 40 mg and patient told them she is taking 20mg  I instructed pharmacist I will follow-up with patient and contact them back   I called patient back and discussed info above, patient states she already informed insurance company and [facility] about D.O.B error. Patient will contact them again to verify her D.O.B is [birth year]. Patient was told by Neurologist Dr.Willis to decrease B/P med to 20 mg due to syncope episodes back in June and always forgot to tell us. Patient aware I d/w Dr.Hopper if she is to continue at 20 mg of Lisinopril, patient also informed in the future she is to bring actual pill bottles to EVERY appointment.   Dr.Hopper please advise on Lisinopril dose change

## 2011-08-24 NOTE — Telephone Encounter (Signed)
She needs to come in with actual pill bottles and blood pressure diary.

## 2011-08-25 ENCOUNTER — Encounter (INDEPENDENT_AMBULATORY_CARE_PROVIDER_SITE_OTHER): Payer: Medicare Other | Admitting: *Deleted

## 2011-08-25 DIAGNOSIS — I6529 Occlusion and stenosis of unspecified carotid artery: Secondary | ICD-10-CM

## 2011-08-25 NOTE — Telephone Encounter (Signed)
Noted  

## 2011-08-25 NOTE — Telephone Encounter (Signed)
Made an appt for pt on 08-28-11. Informed her that she needs to come in with actual pill bottles and bring bp diary. Patient states that she does not have a bp dairy.

## 2011-08-28 ENCOUNTER — Ambulatory Visit (INDEPENDENT_AMBULATORY_CARE_PROVIDER_SITE_OTHER): Payer: Medicare Other | Admitting: Internal Medicine

## 2011-08-28 ENCOUNTER — Ambulatory Visit (INDEPENDENT_AMBULATORY_CARE_PROVIDER_SITE_OTHER)
Admission: RE | Admit: 2011-08-28 | Discharge: 2011-08-28 | Disposition: A | Discharge status: Home / Self Care | Payer: Medicare Other | Source: Ambulatory Visit | Attending: Internal Medicine | Admitting: Internal Medicine

## 2011-08-28 ENCOUNTER — Encounter: Payer: Self-pay | Admitting: Internal Medicine

## 2011-08-28 DIAGNOSIS — D509 Iron deficiency anemia, unspecified: Secondary | ICD-10-CM

## 2011-08-28 DIAGNOSIS — R55 Syncope and collapse: Secondary | ICD-10-CM | POA: Insufficient documentation

## 2011-08-28 DIAGNOSIS — J438 Other emphysema: Secondary | ICD-10-CM

## 2011-08-28 DIAGNOSIS — R0989 Other specified symptoms and signs involving the circulatory and respiratory systems: Secondary | ICD-10-CM

## 2011-08-28 DIAGNOSIS — R0600 Dyspnea, unspecified: Secondary | ICD-10-CM

## 2011-08-28 DIAGNOSIS — R0609 Other forms of dyspnea: Secondary | ICD-10-CM

## 2011-08-28 DIAGNOSIS — E559 Vitamin D deficiency, unspecified: Secondary | ICD-10-CM

## 2011-08-28 DIAGNOSIS — I10 Essential (primary) hypertension: Secondary | ICD-10-CM

## 2011-08-28 DIAGNOSIS — F17200 Nicotine dependence, unspecified, uncomplicated: Secondary | ICD-10-CM

## 2011-08-28 DIAGNOSIS — E039 Hypothyroidism, unspecified: Secondary | ICD-10-CM

## 2011-08-28 LAB — CBC WITH DIFFERENTIAL/PLATELET
Basophils Absolute: 0 10*3/uL (ref 0.0–0.1)
Basophils Relative: 0.5 % (ref 0.0–3.0)
Eosinophils Absolute: 0.1 10*3/uL (ref 0.0–0.7)
Eosinophils Relative: 1.2 % (ref 0.0–5.0)
HCT: 37.8 % (ref 36.0–46.0)
Hemoglobin: 12.7 g/dL (ref 12.0–15.0)
Lymphocytes Relative: 30.5 % (ref 12.0–46.0)
Lymphs Abs: 1.9 10*3/uL (ref 0.7–4.0)
MCHC: 33.7 g/dL (ref 30.0–36.0)
MCV: 86.8 fl (ref 78.0–100.0)
Monocytes Absolute: 0.5 10*3/uL (ref 0.1–1.0)
Monocytes Relative: 7.8 % (ref 3.0–12.0)
Neutro Abs: 3.7 10*3/uL (ref 1.4–7.7)
Neutrophils Relative %: 60 % (ref 43.0–77.0)
Platelets: 175 10*3/uL (ref 150.0–400.0)
RBC: 4.36 Mil/uL (ref 3.87–5.11)
RDW: 14.2 % (ref 11.5–14.6)
WBC: 6.2 10*3/uL (ref 4.5–10.5)

## 2011-08-28 LAB — BASIC METABOLIC PANEL
BUN: 17 mg/dL (ref 6–23)
CO2: 28 mEq/L (ref 19–32)
Calcium: 9 mg/dL (ref 8.4–10.5)
Chloride: 106 mEq/L (ref 96–112)
Creatinine, Ser: 0.8 mg/dL (ref 0.4–1.2)
GFR: 76.3 mL/min (ref >60.00)
Glucose, Bld: 96 mg/dL (ref 70–99)
Potassium: 4.6 mEq/L (ref 3.5–5.1)
Sodium: 141 mEq/L (ref 135–145)

## 2011-08-28 LAB — TSH: TSH: 2.53 u[IU]/mL (ref 0.35–5.50)

## 2011-08-28 MED ORDER — LISINOPRIL 20 MG PO TABS: ORAL_TABLET | ORAL | Status: DC

## 2011-08-28 NOTE — Patient Instructions (Signed)
Blood Pressure Goal  Ideally is an AVERAGE < 135/85. This AVERAGE should be calculated from @ least 5-7 BP readings taken @ different times of day on different days of week. You should not respond to isolated BP readings , but rather the AVERAGE for that week Please bring your  blood pressure cuff to office visits to verify that it is reliable.It  can also be checked against the blood pressure device at the pharmacy. Finger or wrist cuffs are not dependable; an arm cuff is.   Order for x-rays entered into  the computer; these will be performed at [address] [facility] across from [facility]. No appointment is necessary. Consider  [facility]'s smoking cessation program @ [email or web address] or [phone].

## 2011-08-28 NOTE — Assessment & Plan Note (Signed)
2-3 times increased risk  for heart attack or stroke discussed

## 2011-08-28 NOTE — Assessment & Plan Note (Signed)
TSH will be reassessed

## 2011-08-28 NOTE — Assessment & Plan Note (Signed)
Lisinopril be changed to 20 mg one & one  half pills daily. She'll be asked to monitor blood pressure; goals discussed

## 2011-08-28 NOTE — Assessment & Plan Note (Signed)
She is overdue for recheck a vitamin D level

## 2011-08-28 NOTE — Progress Notes (Signed)
Subjective:    Patient ID: Brandi Raymond, female    DOB: 08-Jul-1934, 78 y.o.   MRN: [id number]  HPI She is here to assess blood pressure control. Her neurologist decreased the lisinopril 40 mg from one daily to half a day due to  possible postural hypotension playing a role in her syncope 06/12. The event occurred in the context of abdominal pain while on the commode.  On 08/25/11 her blood pressure was found to be markedly labile during her carotid Doppler followup. She believes her diet systolic blood pressure was as  high as 170. Because of this she increased her lisinopril back to 40 mg daily.  EMR records were reviewed; results of the carotid Doppler could not be obtained.    Review of Systems HTN Monitoring  Blood pressure range: variable with wrist cuff  Chest pain: no   Dyspnea: yes with exertion   Claudication: no Medication compliance: see above Medication Side Effects  Lightheadedness:no, not even with increase to 40 mg  Urinary frequency:no  Edema:no    Preventitive Healthcare:  Exercise: no   Diet Pattern: no plan  Salt Restriction: yes  She is on sertraline which he takes each morning. Is on clonazepam at bedtime. She will also take lorazepam once daily as needed.  She takes gabapentin on average once daily for peripheral neuropathy. She has ocular myasthenia gravis for which she sees Dr. Anne Hahn. Apparently he has recommended she consider taking Aricept; she is declining at present.  Labs 07/25/11 were reviewed. TSH was not performed.       Objective:   Physical Exam Gen.: Thin but adequately nourished in appearance. Alert, slightly argumentative    Eyes: No corneal or conjunctival inflammation noted. Arcus senilis; ptosis OS> OD. Extraocular motion intact.   Mouth: Oral mucosa and oropharynx reveal no lesions or exudates. Dentures. Slightly hoarse. Neck: No deformities, masses, or tenderness noted. Thyroid small Lungs:  Lungs are clear to auscultation without  rales, wheezes, or increased work of breathing but markedly decreased BS. Heart: Normal rate and rhythm. Normal S1 and S2. No gallop, click, or rub. S4 with slurring at  LSB; no   murmur.                                                                        Musculoskeletal/extremities:  Mild clubbing & significant OA finger changes.No cyanosis or edema Nail health  good. Vascular: Carotid, radial artery, dorsalis pedis and  posterior tibial pulses are  equal. Pedal pulses slightly decreased.R > L carotid & aortic  bruits present. Neurologic: Initially she gave the date as of [birth year] but corrected herself.  Skin: Intact without suspicious lesions or rashes. Lymph: No cervical, axillary lymphadenopathy present. Psych: Mood and affect are normal. Normally interactive  Assessment & Plan:

## 2011-08-28 NOTE — Assessment & Plan Note (Signed)
Because of the dyspnea and continued smoking; chest x-ray will be completed

## 2011-08-29 ENCOUNTER — Telehealth: Payer: Self-pay | Admitting: Internal Medicine

## 2011-08-29 LAB — VITAMIN D 25 HYDROXY (VIT D DEFICIENCY, FRACTURES): Vit D, 25-Hydroxy: 40 ng/mL (ref 30–89)

## 2011-08-29 MED ORDER — CLONAZEPAM 1 MG PO TABS: ORAL_TABLET | ORAL | Status: DC

## 2011-08-29 NOTE — Telephone Encounter (Signed)
confidential Office Message [address] [place], [place] [address] p. [phone] f. [phone] To: [facility] (Daytime Triage) Fax: [phone] From: [facility] Date/ Time: 08/29/2011 12:32 PM Taken By: Crissie Figures, CSR Caller: Rhunette Croft Facility: not collected Patient: Brandi Raymond, Brandi Raymond DOB: 04/24/1934 Phone: [phone] Reason for Call: Calling to follow up on Rx reqest. That should be sent to [facility]. Patient was seen in the office yesterday and she is calling to verify the information was sent and what the responce was from them. Regarding Appointment: Appt Date: Appt Time: Unknown Provider: Reason: Details: Outcome:

## 2011-08-29 NOTE — Telephone Encounter (Signed)
Patient aware rx for Lisinopril was re-submitted yesterday at OV. Patient then asked if a rx for clonazepam could be sent to her local pharmacy, rx printed and faxed

## 2011-08-31 ENCOUNTER — Other Ambulatory Visit: Payer: Self-pay | Admitting: *Deleted

## 2011-08-31 NOTE — Telephone Encounter (Signed)
Received voice message from pt stating the pharmacy had not received her Klonopin rx yet. Verified with pharmacist, Trinna Post that they have received RX and disregard previous request. Pt notified.

## 2011-09-11 ENCOUNTER — Telehealth: Payer: Self-pay

## 2011-09-11 ENCOUNTER — Other Ambulatory Visit: Payer: Self-pay

## 2011-09-11 MED ORDER — LEVOTHYROXINE SODIUM 112 MCG PO CAPS: 1.0000 | ORAL_CAPSULE | ORAL | Status: DC

## 2011-09-11 MED ORDER — PRAVASTATIN SODIUM 40 MG PO TABS: 40.0000 mg | ORAL_TABLET | Freq: Every day | ORAL | Status: DC

## 2011-09-11 NOTE — Telephone Encounter (Signed)
Patient called stating that her prescriptions were not received by [facility] pharmacy.  She only received the Lisinopril and not the Levothyroxine or the Pravastatin.  I resent them to the pharmacy.

## 2011-09-15 ENCOUNTER — Telehealth: Payer: Self-pay | Admitting: *Deleted

## 2011-09-15 MED ORDER — PRAVASTATIN SODIUM 40 MG PO TABS: 40.0000 mg | ORAL_TABLET | Freq: Every day | ORAL | Status: DC

## 2011-09-15 MED ORDER — LEVOTHYROXINE SODIUM 112 MCG PO CAPS: 1.0000 | ORAL_CAPSULE | ORAL | Status: DC

## 2011-09-15 NOTE — Telephone Encounter (Signed)
Discuss with patient, Rx sent. 

## 2011-09-15 NOTE — Telephone Encounter (Signed)
Office Message [address] [place], [place] [address] p. [phone] f. [phone] To: [facility] (Daytime Triage) Fax: [phone] From: [facility] Date/ Time: 09/15/2011 11:24 AM Taken By: Geanie Berlin, RN Caller: Daniyah Facility: not collected Patient: Brandi Raymond, Brandi Raymond DOB: [birth year] Phone: [phone] Reason for Call: Requesting 10 days supply of both [facility]othyrox[facility] [phone]. Mail order RX will not arrive on time d/t it was mailed 09/13/11.

## 2011-09-15 NOTE — Telephone Encounter (Signed)
Pharmacy indicated that at previous pharmacy Pt was receiving brand name for same price but at new pharmacy there will be a increase in cost. Pt is requesting to have the generic now is this ok to change. Per Dr Laury Axon ok to change to generic but Pt will need to come in about 2 months to have labs check to make she med is therapeutic. Advise pharmacy of this will inform Pt per pharmacy. Left message to call office to inform Pt of change and labs due.

## 2011-09-25 ENCOUNTER — Telehealth: Payer: Self-pay | Admitting: Internal Medicine

## 2011-09-25 MED ORDER — LORAZEPAM 1 MG PO TABS: 1.0000 mg | ORAL_TABLET | Freq: Every day | ORAL | Status: DC | PRN

## 2011-09-25 NOTE — Telephone Encounter (Signed)
Refill: Lorazepam 1 mg tablet. Take 1 tablet every day as needed.

## 2011-09-25 NOTE — Telephone Encounter (Signed)
Rx sent 

## 2011-10-04 ENCOUNTER — Telehealth: Payer: Self-pay | Admitting: Internal Medicine

## 2011-10-04 NOTE — Telephone Encounter (Signed)
TSH 244.9, this is not due until May 2013

## 2011-10-04 NOTE — Telephone Encounter (Signed)
Patient states she is due for labs. I see from previous phone note that she is due, but I do not see what she needs. Please Advise.

## 2011-10-19 NOTE — Telephone Encounter (Signed)
Pt has labs scheduled for 11-30-11.

## 2011-10-26 ENCOUNTER — Telehealth: Payer: Self-pay | Admitting: Internal Medicine

## 2011-10-26 NOTE — Telephone Encounter (Signed)
Caller: Brandi Raymond/Patient; PCP: Marga Melnick; CB#: [phone]; Call regarding: Patient Is Taking Lisinopril 40 Mg and Her BP Is 156/73;  Pt states her BP at Pharmacy this am was 156/73. Pt asymptomatic and "feels fine". Taking Lisinopril as prescribed. Pt home cuff not working. Triaged per Hypertension, Dx or Suspected. Disp: Home care. Adivsed pt to go to [employer] by her house this afternoon (too far from office) and have BP checked. Advised if BP numbers were outside of 130/85 parameters to call and get a BP check appt. Pt stated she will do this at 1400.

## 2011-11-14 ENCOUNTER — Other Ambulatory Visit: Payer: Self-pay | Admitting: Internal Medicine

## 2011-11-14 MED ORDER — SERTRALINE HCL 50 MG PO TABS: 50.0000 mg | ORAL_TABLET | Freq: Every day | ORAL | Status: DC

## 2011-11-14 NOTE — Telephone Encounter (Signed)
RX sent

## 2011-11-14 NOTE — Telephone Encounter (Signed)
Refill sertraline Tab 50MG  Qty 90 Take one tablet by mouth  Last written 10.30.2012 Last OV 02.11.2013

## 2011-11-20 ENCOUNTER — Telehealth: Payer: Self-pay | Admitting: Internal Medicine

## 2011-11-20 MED ORDER — LORAZEPAM 1 MG PO TABS: 1.0000 mg | ORAL_TABLET | Freq: Every day | ORAL | Status: DC | PRN

## 2011-11-20 NOTE — Telephone Encounter (Signed)
RX sent

## 2011-11-20 NOTE — Telephone Encounter (Signed)
Refill: Lorazepam 1mg  tablet. Take 1 tablet every day as needed.

## 2011-11-29 ENCOUNTER — Other Ambulatory Visit: Payer: Self-pay | Admitting: Internal Medicine

## 2011-11-29 NOTE — Telephone Encounter (Signed)
refill Clonazepam 1mg  tablet Take one tablet at bedtime as needed  Last written 2.12.13, qty 30 Instructions Sig: 1 By mouth at bedtime as needed ONLY  Last ov 02.11.13

## 2011-11-30 ENCOUNTER — Other Ambulatory Visit (INDEPENDENT_AMBULATORY_CARE_PROVIDER_SITE_OTHER): Payer: Medicare Other

## 2011-11-30 DIAGNOSIS — E039 Hypothyroidism, unspecified: Secondary | ICD-10-CM

## 2011-11-30 LAB — TSH: TSH: 5.06 u[IU]/mL (ref 0.35–5.50)

## 2011-11-30 MED ORDER — CLONAZEPAM 1 MG PO TABS: ORAL_TABLET | ORAL | Status: DC

## 2011-11-30 NOTE — Progress Notes (Signed)
Labs only

## 2011-12-19 ENCOUNTER — Encounter: Payer: Self-pay | Admitting: Internal Medicine

## 2011-12-19 ENCOUNTER — Ambulatory Visit (INDEPENDENT_AMBULATORY_CARE_PROVIDER_SITE_OTHER): Payer: Medicare Other | Admitting: Internal Medicine

## 2011-12-19 VITALS — BP 120/78 | HR 66 | Ht 64.08 in | Wt 130.8 lb

## 2011-12-19 DIAGNOSIS — E039 Hypothyroidism, unspecified: Secondary | ICD-10-CM

## 2011-12-19 MED ORDER — LEVOTHYROXINE SODIUM 112 MCG PO CAPS: ORAL_CAPSULE | ORAL | Status: DC

## 2011-12-19 NOTE — Assessment & Plan Note (Signed)
Thyroid replacement will be increased to 112 mcg daily except half a pill on Tuesdays and Thursdays. TSH will be checked in 10 weeks

## 2011-12-19 NOTE — Patient Instructions (Addendum)
Thyroid supplement should be increased to 112 mcg daily except one half on Tuesdays and Thursday. Please document this dose change on your pill bottle & with your Pharmacist. Verify the change in dose  @ all doctor visits also.  PLEASE BRING THESE INSTRUCTIONS TO FOLLOW UP  LAB APPOINTMENT in 10 weeks.This will guarantee correct labs are drawn, eliminating need for repeat blood sampling ( needle sticks ! ). Diagnoses /Codes: 244.9 Consider  [facility]'s smoking cessation program @ www.[place].com or [phone])222-9174.

## 2011-12-19 NOTE — Assessment & Plan Note (Signed)
Consider  [facility]'s smoking cessation program @ www.[place].com or [phone].    

## 2011-12-19 NOTE — Progress Notes (Signed)
  Subjective:    Patient ID: Brandi Raymond, female    DOB: [birth year], 78 y.o.   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 Dione Booze 01/03/12 Hoarseness: no; Swallowing issues: no Cardiovascular: Palpitations: no; Racing: no; Irregularity: no GI: Constipation: chronic constipation; Diarrhea: no Derm: Change in nails/hair/skin: no Neuro: Numbness/tingling: no; Tremor: denies Psych: Anxiety: occasional; Depression: no; Panic attacks: no Endo: Temperature intolerance: Heat: no; Cold: no        Objective:   Physical Exam Gen.:  Thin but well-nourished in appearance. Alert, appropriate and cooperative throughout exam. Head: Normocephalic without obvious abnormalities;  No alopecia  Eyes: No corneal or conjunctival inflammation noted. Mild eyelid ptosis, no lid lag. Pupils equal round reactive to light and accommodation. Extraocular motion intact. Vision grossly normal without lenses. arcus Ears: External  ear exam reveals no significant lesions or deformities. Canals clear .TMs normal. Hearing is grossly normal bilaterally. Nose: External nasal exam reveals no deformity or inflammation. Nasal mucosa are pink and moist. No lesions or exudates noted. Septum midline. Mouth: Oral mucosa and oropharynx reveal no lesions or exudates. Teeth in good repair. Neck: No deformities, masses, or tenderness noted. Range of motion normal. Thyroid small Lungs: Normal respiratory effort; chest expands symmetrically. Lungs are clear to auscultation without rales, wheezes, or increased work of breathing. Heart: Normal rate  and rhythm. Normal S1 and S2. No gallop, click, or rub. No  murmur. Abdomen: Bowel sounds normal; abdomen soft and nontender. No masses, organomegaly or hernias noted.                                                                                   Musculoskeletal/extremities: Mild thoracic lordosis noted . No clubbing, cyanosis, edema, or deformity noted. Range of motion  normal .Tone & strength  normal.Joints : mixed arthritic changes. Nail health  good. Vascular: Carotid, radial artery, dorsalis pedis and  posterior tibial pulses are full and equal. L carotid bruit > R Neurologic: Alert and oriented x3. Deep tendon reflexes symmetrical and normal.          Skin: Intact without suspicious lesions or rashes. Lymph: No cervical, axillary lymphadenopathy present. Psych: Mood and affect are normal. Normally interactive                                                                                         Assessment & Plan:

## 2012-01-19 ENCOUNTER — Other Ambulatory Visit: Payer: Self-pay | Admitting: Internal Medicine

## 2012-01-19 MED ORDER — LORAZEPAM 1 MG PO TABS: 1.0000 mg | ORAL_TABLET | Freq: Every day | ORAL | Status: DC | PRN

## 2012-01-19 NOTE — Telephone Encounter (Signed)
refill clonazepam 1mg  tablet, take one tablet by mouth as needed, no qty or last fill listed. Last wrt 5.16.13, #30 wt/ one refill Last ov 6.4.13

## 2012-01-19 NOTE — Telephone Encounter (Signed)
Request is too early, the earliest medication can be filled is 01/25/12

## 2012-01-19 NOTE — Telephone Encounter (Signed)
refill lorazepam 1mg  tablet  Take 1-tab by mouth every day as needed Last wrt 5.6.13 qty 60 Last ov 6.4

## 2012-01-19 NOTE — Telephone Encounter (Signed)
RX called in .

## 2012-01-24 ENCOUNTER — Telehealth: Payer: Self-pay | Admitting: Internal Medicine

## 2012-01-24 NOTE — Telephone Encounter (Signed)
Refill: Clonazepam 1mg  tablet. Take 1 tablet at bedtime as needed.

## 2012-01-25 MED ORDER — CLONAZEPAM 1 MG PO TABS: ORAL_TABLET | ORAL | Status: DC

## 2012-01-25 NOTE — Telephone Encounter (Signed)
RX called in .

## 2012-02-26 ENCOUNTER — Telehealth: Payer: Self-pay | Admitting: Internal Medicine

## 2012-02-26 MED ORDER — CLONAZEPAM 1 MG PO TABS: ORAL_TABLET | ORAL | Status: DC

## 2012-02-26 NOTE — Telephone Encounter (Signed)
RX called in .

## 2012-02-26 NOTE — Telephone Encounter (Signed)
Refill: Clonazepam 1mg  tablet. Take 1 tablet at bedtime as needed. Last fill 01-25-12

## 2012-02-27 ENCOUNTER — Other Ambulatory Visit (INDEPENDENT_AMBULATORY_CARE_PROVIDER_SITE_OTHER): Payer: Medicare Other

## 2012-02-27 ENCOUNTER — Other Ambulatory Visit: Payer: Medicare Other

## 2012-02-27 DIAGNOSIS — E039 Hypothyroidism, unspecified: Secondary | ICD-10-CM

## 2012-02-27 LAB — TSH: TSH: 2.16 u[IU]/mL (ref 0.35–5.50)

## 2012-02-28 ENCOUNTER — Other Ambulatory Visit: Payer: Medicare Other

## 2012-03-15 ENCOUNTER — Other Ambulatory Visit: Payer: Self-pay | Admitting: Cardiology

## 2012-03-15 DIAGNOSIS — I6529 Occlusion and stenosis of unspecified carotid artery: Secondary | ICD-10-CM

## 2012-03-19 ENCOUNTER — Telehealth: Payer: Self-pay | Admitting: Internal Medicine

## 2012-03-19 MED ORDER — LORAZEPAM 1 MG PO TABS: 1.0000 mg | ORAL_TABLET | Freq: Every day | ORAL | Status: DC | PRN

## 2012-03-19 NOTE — Telephone Encounter (Signed)
Refill: Lorazepam 1mg  tablet. Take 1 tablet every day as needed. Last fill 01-19-12

## 2012-03-19 NOTE — Telephone Encounter (Signed)
RX called in .

## 2012-03-21 ENCOUNTER — Encounter (INDEPENDENT_AMBULATORY_CARE_PROVIDER_SITE_OTHER): Payer: Medicare Other

## 2012-03-21 DIAGNOSIS — I6529 Occlusion and stenosis of unspecified carotid artery: Secondary | ICD-10-CM

## 2012-03-25 ENCOUNTER — Other Ambulatory Visit: Payer: Self-pay | Admitting: Internal Medicine

## 2012-03-25 MED ORDER — CLONAZEPAM 1 MG PO TABS: ORAL_TABLET | ORAL | Status: DC

## 2012-03-25 NOTE — Telephone Encounter (Signed)
RX called in .

## 2012-03-25 NOTE — Telephone Encounter (Signed)
Refill ClonazePAM (Tab) 1 MG 1 By mouth at bedtime as needed --last fill 8.12.13 Last ov 6.4.13 f/u labs    Note our instructions in chart are as follows--note on request is somewhat different   1 By mouth at bedtime as needed ONLY

## 2012-03-27 ENCOUNTER — Telehealth: Payer: Self-pay

## 2012-03-27 NOTE — Telephone Encounter (Signed)
Message copied by Maurice Small on Wed Mar 27, 2012  5:17 PM ------      Message from: Pecola Lawless      Created: Sun Mar 24, 2012  1:38 PM       60 - 79% internal carotid artery stenosis present bilaterally; monitor in six months is recommended. It is critical to control blood pressure and stop smoking. Unfortunately smoking will increase your risk of stroke or heart attack to three times normal. Report any lightheadedness, passing out, or weakness, numbness or tingling in the extremities.

## 2012-03-27 NOTE — Telephone Encounter (Signed)
Patient aware of results and was without question at the time of call. Report to be mailed

## 2012-04-01 ENCOUNTER — Telehealth: Payer: Self-pay | Admitting: *Deleted

## 2012-04-01 NOTE — Telephone Encounter (Signed)
Called pt to advise that we will be glad to see her at the upcoming apt, however she may still need to see a GYN per pt was advised by MD Tabori on 5-12 to contact GYN if any further concerns arise, pt stated that the cyst only burns when she wipes and she thought that maybe MD Tabori could burn it off or freeze it or something" advised MD Beverely Low does NOT burn or freeze area's in the vagina thus she may still have to go to GYN after her OV here, pt understood and notes she still wants to keep apt with MD Beverely Low on wed, MD Tabori made aware verbally

## 2012-04-03 ENCOUNTER — Encounter: Payer: Self-pay | Admitting: Family Medicine

## 2012-04-03 ENCOUNTER — Ambulatory Visit (INDEPENDENT_AMBULATORY_CARE_PROVIDER_SITE_OTHER): Payer: Medicare Other | Admitting: Family Medicine

## 2012-04-03 VITALS — BP 112/70 | HR 80 | Temp 97.4°F | Ht 64.0 in | Wt 128.0 lb

## 2012-04-03 DIAGNOSIS — N907 Vulvar cyst: Secondary | ICD-10-CM

## 2012-04-03 DIAGNOSIS — N9089 Other specified noninflammatory disorders of vulva and perineum: Secondary | ICD-10-CM

## 2012-04-03 DIAGNOSIS — N390 Urinary tract infection, site not specified: Secondary | ICD-10-CM | POA: Insufficient documentation

## 2012-04-03 LAB — POCT URINALYSIS DIPSTICK
Bilirubin, UA: NEGATIVE
Glucose, UA: NEGATIVE
Ketones, UA: NEGATIVE
Nitrite, UA: NEGATIVE
Protein, UA: NEGATIVE
Spec Grav, UA: 1.01
Urobilinogen, UA: 0.2
pH, UA: 6.5

## 2012-04-03 MED ORDER — CEPHALEXIN 500 MG PO CAPS: 500.0000 mg | ORAL_CAPSULE | Freq: Two times a day (BID) | ORAL | Status: AC

## 2012-04-03 NOTE — Progress Notes (Signed)
  Subjective:    Patient ID: Brandi Raymond, female    DOB: October 27, 1933, 78 y.o.   MRN: [id number]  HPI ? UTI- notes some dysuria, urinary odor.  Some vaginal itching.  Some increased frequency.  No fevers, chills.  Having some suprapubic discomfort  Labial cyst- pt can't tell if it has grown, is having some irritation w/ wiping and after urination.  No blood.  No drainage.  Mildly TTP.   Review of Systems For ROS see HPI     Objective:   Physical Exam  Constitutional: She appears well-developed and well-nourished. No distress.  Abdominal: Soft. Bowel sounds are normal. She exhibits no distension. There is no tenderness (no CVA or suprapubic tenderness). There is no rebound.  Genitourinary:       Small inclusion cyst on L labia majora No evidence of vaginal discharge or yeast  Skin: Skin is warm and dry.          Assessment & Plan:

## 2012-04-03 NOTE — Patient Instructions (Addendum)
Start the Keflex twice daily for the UTI Drink plenty of fluids I don't think this has anything to do with the cyst Call with any questions or concerns Hang in there!

## 2012-04-05 LAB — URINE CULTURE

## 2012-04-09 NOTE — Assessment & Plan Note (Signed)
New.  Pt's sxs and UA consistent w/ infxn.  Start abx.  Reviewed supportive care and red flags that should prompt return.  Pt expressed understanding and is in agreement w/ plan.  

## 2012-04-09 NOTE — Assessment & Plan Note (Signed)
Unchanged.  No evidence of infxn, not tender, no redness or drainage.  No need for intervention at this time.  Reassurance provided.

## 2012-04-15 ENCOUNTER — Ambulatory Visit: Payer: Medicare Other | Admitting: Internal Medicine

## 2012-04-23 ENCOUNTER — Encounter (HOSPITAL_COMMUNITY): Payer: Self-pay | Admitting: Physical Medicine and Rehabilitation

## 2012-04-23 ENCOUNTER — Emergency Department (HOSPITAL_COMMUNITY)
Admission: EM | Admit: 2012-04-23 | Discharge: 2012-04-23 | Disposition: A | Discharge status: Home / Self Care | Payer: Medicare Other | Attending: Emergency Medicine | Admitting: Emergency Medicine

## 2012-04-23 DIAGNOSIS — F17200 Nicotine dependence, unspecified, uncomplicated: Secondary | ICD-10-CM | POA: Insufficient documentation

## 2012-04-23 DIAGNOSIS — W19XXXA Unspecified fall, initial encounter: Secondary | ICD-10-CM | POA: Insufficient documentation

## 2012-04-23 DIAGNOSIS — S7000XA Contusion of unspecified hip, initial encounter: Secondary | ICD-10-CM | POA: Insufficient documentation

## 2012-04-23 DIAGNOSIS — I252 Old myocardial infarction: Secondary | ICD-10-CM | POA: Insufficient documentation

## 2012-04-23 DIAGNOSIS — I2510 Atherosclerotic heart disease of native coronary artery without angina pectoris: Secondary | ICD-10-CM | POA: Insufficient documentation

## 2012-04-23 DIAGNOSIS — Z8249 Family history of ischemic heart disease and other diseases of the circulatory system: Secondary | ICD-10-CM | POA: Insufficient documentation

## 2012-04-23 DIAGNOSIS — Z833 Family history of diabetes mellitus: Secondary | ICD-10-CM | POA: Insufficient documentation

## 2012-04-23 DIAGNOSIS — Z888 Allergy status to other drugs, medicaments and biological substances status: Secondary | ICD-10-CM | POA: Insufficient documentation

## 2012-04-23 DIAGNOSIS — Z8489 Family history of other specified conditions: Secondary | ICD-10-CM | POA: Insufficient documentation

## 2012-04-23 DIAGNOSIS — Z800 Family history of malignant neoplasm of digestive organs: Secondary | ICD-10-CM | POA: Insufficient documentation

## 2012-04-23 DIAGNOSIS — K219 Gastro-esophageal reflux disease without esophagitis: Secondary | ICD-10-CM | POA: Insufficient documentation

## 2012-04-23 DIAGNOSIS — Z7982 Long term (current) use of aspirin: Secondary | ICD-10-CM | POA: Insufficient documentation

## 2012-04-23 DIAGNOSIS — E119 Type 2 diabetes mellitus without complications: Secondary | ICD-10-CM | POA: Insufficient documentation

## 2012-04-23 DIAGNOSIS — Z808 Family history of malignant neoplasm of other organs or systems: Secondary | ICD-10-CM | POA: Insufficient documentation

## 2012-04-23 MED ORDER — HYDROMORPHONE HCL PF 1 MG/ML IJ SOLN
1.0000 mg | Freq: Once | INTRAMUSCULAR | Status: AC
  Administered 2012-04-23: 1 mg via INTRAMUSCULAR
  Filled 2012-04-23: qty 1

## 2012-04-23 MED ORDER — OXYCODONE-ACETAMINOPHEN 5-325 MG PO TABS: 1.0000 | ORAL_TABLET | Freq: Four times a day (QID) | ORAL | Status: DC | PRN
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
## 2012-04-23 NOTE — ED Notes (Signed)
Pt presents to department for evaluation of fall and R hip pain. States she fell September 29th off of porch at home, went to see Orthopedic specialist on October 3rd, no abdnormal findings. Pt states R hip continues to hurt, becomes worse with movement. 10/10 at the time. Pt is conscious alert and oriented x4.

## 2012-04-24 ENCOUNTER — Other Ambulatory Visit: Payer: Self-pay | Admitting: Internal Medicine

## 2012-04-24 NOTE — Telephone Encounter (Signed)
04/03/12 OV. Last filled 03/25/12 no # info in Rx.  PLz advise         MW

## 2012-04-24 NOTE — Telephone Encounter (Signed)
Spoke with pt husband advising pt Rx ready for pick up.        MW

## 2012-04-26 ENCOUNTER — Ambulatory Visit: Payer: Medicare Other | Admitting: Gastroenterology

## 2012-05-06 ENCOUNTER — Telehealth: Payer: Self-pay | Admitting: Internal Medicine

## 2012-05-06 MED ORDER — LISINOPRIL 20 MG PO TABS: 30.0000 mg | ORAL_TABLET | Freq: Every day | ORAL | Status: DC

## 2012-05-06 MED ORDER — SERTRALINE HCL 50 MG PO TABS: 50.0000 mg | ORAL_TABLET | Freq: Every day | ORAL | Status: DC

## 2012-05-06 NOTE — Telephone Encounter (Signed)
RXs sent.

## 2012-05-06 NOTE — Telephone Encounter (Signed)
Refill: Sertraline tab 50 mg. Take 1 by mouth daily. 90 day supply  Lisinopril tab 20mg . Take 1 and 1/2 by mouth daily. 90 day supply

## 2012-05-15 ENCOUNTER — Encounter: Payer: Self-pay | Admitting: Gastroenterology

## 2012-05-15 ENCOUNTER — Ambulatory Visit (INDEPENDENT_AMBULATORY_CARE_PROVIDER_SITE_OTHER): Payer: Medicare Other | Admitting: Gastroenterology

## 2012-05-15 VITALS — BP 130/80 | HR 68 | Ht 64.0 in | Wt 128.4 lb

## 2012-05-15 DIAGNOSIS — Z8719 Personal history of other diseases of the digestive system: Secondary | ICD-10-CM

## 2012-05-15 DIAGNOSIS — K6389 Other specified diseases of intestine: Secondary | ICD-10-CM

## 2012-05-15 DIAGNOSIS — K449 Diaphragmatic hernia without obstruction or gangrene: Secondary | ICD-10-CM

## 2012-05-15 DIAGNOSIS — K5730 Diverticulosis of large intestine without perforation or abscess without bleeding: Secondary | ICD-10-CM

## 2012-05-15 DIAGNOSIS — R140 Abdominal distension (gaseous): Secondary | ICD-10-CM

## 2012-05-15 DIAGNOSIS — J439 Emphysema, unspecified: Secondary | ICD-10-CM

## 2012-05-15 DIAGNOSIS — J438 Other emphysema: Secondary | ICD-10-CM

## 2012-05-15 DIAGNOSIS — R141 Gas pain: Secondary | ICD-10-CM

## 2012-05-15 DIAGNOSIS — K8020 Calculus of gallbladder without cholecystitis without obstruction: Secondary | ICD-10-CM

## 2012-05-15 DIAGNOSIS — R143 Flatulence: Secondary | ICD-10-CM

## 2012-05-15 DIAGNOSIS — F17200 Nicotine dependence, unspecified, uncomplicated: Secondary | ICD-10-CM

## 2012-05-15 DIAGNOSIS — I2510 Atherosclerotic heart disease of native coronary artery without angina pectoris: Secondary | ICD-10-CM

## 2012-05-15 MED ORDER — ALIGN PO CAPS: 1.0000 | ORAL_CAPSULE | Freq: Every day | ORAL | Status: DC

## 2012-05-15 MED ORDER — RIFAXIMIN 550 MG PO TABS: 550.0000 mg | ORAL_TABLET | Freq: Two times a day (BID) | ORAL | Status: DC

## 2012-05-15 NOTE — Patient Instructions (Addendum)
You have been given a separate informational sheet regarding your tobacco use, the importance of quitting and local resources to help you quit.   We have sent the following medications to your pharmacy for you to pick up at your convenience: Xifaxan.  We have given you samples of Align. This puts good bacteria back into your colon. You should take 1 capsule by mouth once daily. If this works well for you, it can be purchased over the counter.  Also you have been given a Fodmap diet.  Please follow up with Dr. Jarold Motto in 3 weeks.

## 2012-05-15 NOTE — Progress Notes (Signed)
History of Present Illness:  This is a complex 78 year old Caucasian female with severe COPD from cigarette abuse. She has asymptomatic gallstones, and a long history of IBS with abdominal gas, bloating, and periodic constipation. She was last seen in January of 2012 with similar complaints and had a negative CT scan of the abdomen. She's had previous colonoscopy and endoscopies which have shown diverticulosis, hiatal hernia, and she had a negative small bowel biopsy for celiac disease. Her main complaint now is vague diffuse abdominal low-grade discomfort with bloating and borborygmi. She denies a specific food intolerances, anorexia, weight loss, melena or hematochezia. She has a brother that apparently had colon cancer. The patient is up-to-date on her endoscopic procedures. She also suffers from chronic anxiety syndrome, mild dementia, and hypertensive cardiovascular disease. She also is on oxycodone when necessary for chronic pain syndrome.  I have reviewed this patient's present history, medical and surgical past history, allergies and medications.     ROS: The remainder of the 10 point ROS is negative... some shortness of breath at rest with dyspnea on exertion. She apparently had previous angioplasty, and is on statin medications. She relates that her anxiety and depression is under good control on Klonopin and Zoloft. She denies angina or arrhythmias.     Physical Exam: Healthy-appearing patient in no acute distress. Blood pressure 130/80, pulse 60 and regular, and weight 128 with a BMI of 22.04. Resting oxygen saturation is 89%. I cannot appreciate stigmata of chronic liver disease. General well developed well nourished patient in no acute distress, appearing their stated age Eyes PERRLA, no icterus, fundoscopic exam per opthamologist Skin no lesions noted Neck supple, no adenopathy, no thyroid enlargement, no tenderness Chest markedly diminished breath sounds in both lung fields with  scattered rhonchi noted. Heart no significant murmurs, gallops or rubs noted Abdomen no hepatosplenomegaly masses or tenderness, BS normal.  Rectal inspection normal no fissures, or fistulae noted.  No masses or tenderness on digital exam. Stool guaiac negative. Extremities no acute joint lesions, edema, phlebitis or evidence of cellulitis. Neurologic patient oriented x 3, cranial nerves intact, no focal neurologic deficits noted. Psychological mental status normal and normal affect.  Assessment and plan: This patient has severe emphysema and COPD, and is not a good candidate for conscious sedation any type of endoscopic procedure. Her symptoms do seem most consistent with low-grade bacterial overgrowth syndrome, diverticulosis coli, and chronic IBS. I will give her a therapeutic trial of Xifaxan 550 mg twice a day for 2 weeks with probiotic therapy. I will see her back in 3 weeks' time for followup. I reviewed her extensive chart, radiographs, and laboratory data. Her husband was present with her throughout the exam and interview. She's had consistently guaiac negative stools on chart review and no evidence of liver function test abnormalities or anemia. Previous ultrasound exams have shown asymptomatic gallstones. Patient continues to smoke cigarettes, but denies alcohol abuse. She apparently does suffer from mild dementia.  No diagnosis found.

## 2012-05-16 ENCOUNTER — Telehealth: Payer: Self-pay | Admitting: Internal Medicine

## 2012-05-16 NOTE — Telephone Encounter (Signed)
Spoke with patient, patient stated she would like to have labs prior to CPX, I informed patient that she must check with her insurance company first and if they cover labs prior we will order. Patient then decided she will just wait.   Patient states she seen the GI doctor and they told her she probably needs to schedule a physical with her primary. I asked patient was she told this because they were concerned about her health or informing her to have regular maintenance follow-up's. Patient states no concerns that she is aware of. Patient requested to be placed on waiting list for CPX.   Dr.Hopper please advise if GI informed you that patient needs to be seen sooner than Jan

## 2012-05-16 NOTE — Telephone Encounter (Signed)
Pt called in and stated she needed CPE - she states other doctor said she need to have one - now she is concerned- I have scheduled her for January - but she would like earlier - she would like you to call her.

## 2012-05-16 NOTE — Telephone Encounter (Signed)
January OK

## 2012-05-18 ENCOUNTER — Other Ambulatory Visit: Payer: Self-pay | Admitting: Internal Medicine

## 2012-05-20 NOTE — Telephone Encounter (Signed)
Dr.Hopper please advise on refill request for 2 controlled medications

## 2012-05-20 NOTE — Telephone Encounter (Signed)
OK X1 but additional refills require office visit to update medical history. These medications should not be taken with an 8-12 hours of each other.

## 2012-05-20 NOTE — Telephone Encounter (Signed)
RX called in, emphasized rx's not to taken 8-12 hours of each other.  I called patient and scheduled appointment for 06/20/12 to follow-up as per requested by MD

## 2012-05-22 DIAGNOSIS — R55 Syncope and collapse: Secondary | ICD-10-CM | POA: Insufficient documentation

## 2012-05-22 DIAGNOSIS — G609 Hereditary and idiopathic neuropathy, unspecified: Secondary | ICD-10-CM | POA: Insufficient documentation

## 2012-06-06 ENCOUNTER — Telehealth: Payer: Self-pay | Admitting: Gastroenterology

## 2012-06-06 NOTE — Telephone Encounter (Signed)
Patient has continued gas and bloating.  She did take the xifaxan and the Align samples that were given to her.  She can't afford the align.  I have asked her to please see if there is another probiotic she can afford and start on it.  She was to return in 3 weeks.  I have made her a follow up for 06/18/12

## 2012-06-10 ENCOUNTER — Encounter: Payer: Self-pay | Admitting: *Deleted

## 2012-06-18 ENCOUNTER — Encounter: Payer: Self-pay | Admitting: Gastroenterology

## 2012-06-18 ENCOUNTER — Ambulatory Visit (INDEPENDENT_AMBULATORY_CARE_PROVIDER_SITE_OTHER): Payer: Medicare Other | Admitting: Gastroenterology

## 2012-06-18 VITALS — BP 120/70 | HR 81 | Ht 64.0 in | Wt 128.2 lb

## 2012-06-18 DIAGNOSIS — R1032 Left lower quadrant pain: Secondary | ICD-10-CM

## 2012-06-18 DIAGNOSIS — R140 Abdominal distension (gaseous): Secondary | ICD-10-CM

## 2012-06-18 DIAGNOSIS — G8929 Other chronic pain: Secondary | ICD-10-CM

## 2012-06-18 DIAGNOSIS — K589 Irritable bowel syndrome without diarrhea: Secondary | ICD-10-CM

## 2012-06-18 DIAGNOSIS — R141 Gas pain: Secondary | ICD-10-CM

## 2012-06-18 DIAGNOSIS — J449 Chronic obstructive pulmonary disease, unspecified: Secondary | ICD-10-CM

## 2012-06-18 DIAGNOSIS — R143 Flatulence: Secondary | ICD-10-CM

## 2012-06-18 MED ORDER — CILIDINIUM-CHLORDIAZEPOXIDE 2.5-5 MG PO CAPS: 1.0000 | ORAL_CAPSULE | Freq: Three times a day (TID) | ORAL | Status: DC | PRN

## 2012-06-18 NOTE — Patient Instructions (Addendum)
We have sent the following medications to your pharmacy for you to pick up at your convenience: Librax. Please take as directed  You have been given a FOD MAP diet to follow

## 2012-06-18 NOTE — Progress Notes (Signed)
This is a 78 year old Caucasian female with functional GI complaints, IBS, who complains of continued abdominal gas, bloating, and vague lower abdominal discomfort.  She's had multiple GI workups which have all been negative.  She does have asymptomatic gallstones.  She recently was seen and felt to possibly have bacterial overgrowth syndrome, but treatment with Xifaxan and probiotics has not changed her symptomatology.  She's had CT scan of the abdomen and pelvis within the last 3 years, and is not a candidate for repeat colonoscopy or endoscopy because of severe COPD.  Her oxygen saturation at room air is 90%.  She denies rectal bleeding, upper GI or hepatobiliary complaints, anorexia or weight loss.  Current Medications, Allergies, Past Medical History, Past Surgical History, Family History and Social History were reviewed in [employer] record.  Pertinent Review of Systems Negative  Physical Exam: LT appearing patient in no distress.  Blood pressure 120/70, pulse 81 and regular, and weight 128 pounds with a BMI of 22.01.  Resting oxygen saturation 90%.  Abdominal exam shows no distention, organomegaly, masses or tenderness.  Bowel sounds are entirely normal.  Mental status is normal.    Assessment and Plan: Diarrhea  predominant IBS functional GI complaints.  I placed her on Librax one by mouth 3 times a day with a FODMAP diet for IBS.  She is to decrease his dosage to twice a day or when necessary as tolerated.  I see no need for repeat GI evaluation at this time.  Please copy Dr. Marga Melnick her primary care physician No diagnosis found.

## 2012-06-20 ENCOUNTER — Ambulatory Visit (INDEPENDENT_AMBULATORY_CARE_PROVIDER_SITE_OTHER): Payer: Medicare Other | Admitting: Internal Medicine

## 2012-06-20 ENCOUNTER — Encounter: Payer: Self-pay | Admitting: Internal Medicine

## 2012-06-20 VITALS — BP 124/78 | HR 64 | Wt 128.2 lb

## 2012-06-20 DIAGNOSIS — R413 Other amnesia: Secondary | ICD-10-CM

## 2012-06-20 DIAGNOSIS — F17200 Nicotine dependence, unspecified, uncomplicated: Secondary | ICD-10-CM

## 2012-06-20 DIAGNOSIS — K589 Irritable bowel syndrome without diarrhea: Secondary | ICD-10-CM

## 2012-06-20 DIAGNOSIS — E119 Type 2 diabetes mellitus without complications: Secondary | ICD-10-CM

## 2012-06-20 DIAGNOSIS — G7000 Myasthenia gravis without (acute) exacerbation: Secondary | ICD-10-CM

## 2012-06-20 DIAGNOSIS — Z23 Encounter for immunization: Secondary | ICD-10-CM

## 2012-06-20 DIAGNOSIS — E039 Hypothyroidism, unspecified: Secondary | ICD-10-CM

## 2012-06-20 DIAGNOSIS — I739 Peripheral vascular disease, unspecified: Secondary | ICD-10-CM

## 2012-06-20 MED ORDER — LEVOTHYROXINE SODIUM 112 MCG PO CAPS: 56.0000 ug | ORAL_CAPSULE | Freq: Every day | ORAL | Status: DC

## 2012-06-20 NOTE — Patient Instructions (Addendum)
Please review the medication list in the After Visit Summary provided.Please write the name of the prescribing physician to the right of the medication and share this with all medical staff seen at each appointment. This will help provide continuity of care; help optimize therapeutic interventions;and help prevent drug:drug adverse reaction.  Please think about quitting smoking. Review the risks we discussed. Please call 1-800-QUIT-NOW (8197543000) for free smoking cessation counseling.    If you activate My Chart; the results can be released to you as soon as they populate from the lab. If you choose not to use this program; the labs have to be reviewed, copied & mailed   causing a delay in getting the results to you.  Review and correct the record as indicated. Please share record with all medical staff seen.

## 2012-06-20 NOTE — Assessment & Plan Note (Signed)
Polypharmacy would be associated with potential serious adverse drug: Drug interaction. This was discussed. The generic Librax and lorazepam cannot be taken together. At this time I recommended discontinuing lorazepam. She can use the generic Librax before meals if needed. Low-dose clonazepam at bedtime to be continued.

## 2012-06-20 NOTE — Assessment & Plan Note (Signed)
A1c will be checked

## 2012-06-20 NOTE — Assessment & Plan Note (Signed)
No change in thyroid dose indicated; monitor TSH annually

## 2012-06-20 NOTE — Assessment & Plan Note (Signed)
Risk discussed 

## 2012-06-20 NOTE — Progress Notes (Signed)
  Subjective:    Patient ID: Brandi Raymond, female    DOB: 11-Apr-1934, 78 y.o.   MRN: [id number]  HPI  She is here to followup on medication management because of polypharmacy and memory deficits. She is seen her neurologist, Dr. Anne Hahn who increased her Aricept to 10 mg daily.  She additionally has seen her gastroenterologist, Dr. Jarold Motto who prescribed generic Librax 06/18/12 for irritable bowel syndrome . She took her first dose yesterday with partial response.  At this time potentially psychotropic agents include sertraline, Aricept, clonazepam, lorazepam, and the generic Librax. She takes the clonazepam at bedtime and the lorazepam at mid day for anxiety which she states is chronic.  Her husband made the astute comment : "You think these medicines might work against each other"     Review of Systems Her TSH is at ideal range at 2.16 on the present dose of thyroid supplement. She is no longer on any diabetic medications. She is on no specific diet. She is not monitoring her glucoses. Her last A1c was 5.9% in October 2012.  She has intermittent cough; has exertional dyspnea which is stable. She denies hemoptysis. She states she smokes a pack per week; her husband describes it as close to a pack per day.     Objective:   Physical Exam Gen.: Thin & suboptimally nourished in appearance.  Head: Normocephalic without obvious abnormalities  Eyes: No corneal or conjunctival inflammation noted.  Extraocular motion intact. Arcus  Mouth: Oral mucosa and oropharynx reveal no lesions or exudates. Dentures in good repair. Neck: No deformities, masses, or tenderness noted.  Thyroid not palpable. Lungs:  Lungs are clear to auscultation without rales, wheezes, or increased work of breathing.markedly decreased BS Heart: Normal rate and rhythm. Normal S1 and S2. No gallop, click, or rub.S4 w/o murmur. Abdomen: Bowel sounds normal; abdomen soft and nontender. No masses, organomegaly or hernias  noted.Aorta palpable with bruit ; no AAA                                                                         Musculoskeletal/extremities:  No clubbing, cyanosis, edema noted. DIP osteoarthritic finger changes .Nail health  good. Vascular: Carotid, radial artery,  and  posterior tibial pulses are full and equal. L > R carotid bruits present. Decreased DPP Neurologic:  Deep tendon reflexes symmetrical and normal.          Skin: Intact without suspicious lesions or rashes.Graft scars Lymph: No cervical, axillary lymphadenopathy present. Psych: Mood and affect are normal. Normally interactive                                                                                         Assessment & Plan:  #1 see Problem List with Assessments & Recommendations Plan: see Orders

## 2012-06-21 ENCOUNTER — Ambulatory Visit: Payer: Medicare Other

## 2012-06-21 DIAGNOSIS — E119 Type 2 diabetes mellitus without complications: Secondary | ICD-10-CM

## 2012-06-21 LAB — HEMOGLOBIN A1C
Hgb A1c MFr Bld: 6 % — ABNORMAL HIGH (ref <5.7)
Mean Plasma Glucose: 126 mg/dL — ABNORMAL HIGH (ref <117)

## 2012-06-26 ENCOUNTER — Other Ambulatory Visit: Payer: Self-pay | Admitting: Internal Medicine

## 2012-06-27 ENCOUNTER — Other Ambulatory Visit: Payer: Self-pay | Admitting: Internal Medicine

## 2012-06-27 MED ORDER — LEVOTHYROXINE SODIUM 112 MCG PO CAPS: 112.0000 ug | ORAL_CAPSULE | ORAL | Status: DC

## 2012-06-27 NOTE — Telephone Encounter (Signed)
LEVOTHYROXINE 112 MCG CAPS QTY:90 TAKE .5-1 CAPAULES .Marland Kitchen... PAPER CANNOT BE READ

## 2012-06-27 NOTE — Telephone Encounter (Signed)
RX resent

## 2012-07-01 ENCOUNTER — Telehealth: Payer: Self-pay

## 2012-07-01 NOTE — Telephone Encounter (Signed)
I called [employer] and verified rx received. I was told rx is on file and patient needs to call and give ok to release rx,  RX number [id number]. I called patient and informed her to f/u with pharmacy, patient indicated she would tomorrow for she was out with her sisters.

## 2012-07-01 NOTE — Telephone Encounter (Signed)
Message copied by Maurice Small on Mon Jul 01, 2012  4:21 PM ------      Message from: MCDANIELS, [place] R      Created: Mon Jul 01, 2012  3:42 PM      Contact: Barrett       Cb# [phone]      Pt stated mail order sent her a letter stating they could not fill meds due to dosage issue       Advised pt it appears it was sent twice      patIent has enough to get her thru this week, is there anyway you can call it it to her pharmacy ?

## 2012-07-03 ENCOUNTER — Other Ambulatory Visit: Payer: Self-pay | Admitting: Internal Medicine

## 2012-07-03 MED ORDER — LEVOTHYROXINE SODIUM 112 MCG PO TABS: ORAL_TABLET | ORAL | Status: DC

## 2012-07-03 MED ORDER — LEVOTHYROXINE SODIUM 112 MCG PO CAPS: ORAL_CAPSULE | ORAL | Status: DC

## 2012-07-03 NOTE — Telephone Encounter (Signed)
Spoke with patient and verified local pharmacy, rx sent over

## 2012-07-03 NOTE — Telephone Encounter (Signed)
Levothyroxine Sodium 112 MCG CAPS   Mail service said that due to the holidays pt needed to have a 2 wk supply called in to a local pharm.  Pt would like you to call her back

## 2012-07-27 ENCOUNTER — Other Ambulatory Visit: Payer: Self-pay | Admitting: Internal Medicine

## 2012-07-30 NOTE — Telephone Encounter (Signed)
Patient aware Controlled Substance Contract to be sign and rx to be picked up   

## 2012-08-06 ENCOUNTER — Encounter: Payer: Self-pay | Admitting: Lab

## 2012-08-06 ENCOUNTER — Other Ambulatory Visit: Payer: Self-pay | Admitting: Internal Medicine

## 2012-08-06 NOTE — Telephone Encounter (Signed)
@   appt 1/22 we'll discuss Lorazepam OR Clonazepam , not both

## 2012-08-06 NOTE — Telephone Encounter (Signed)
Spoke with patient, patient states she had stopped Lorazepam and would like to restart. Hopp please advise

## 2012-08-06 NOTE — Telephone Encounter (Signed)
See office visit in December. Lorazepam was discontinued as she is on clonazepam and also Librax. The clonazepam can be taken at bedtime if needed. It should not be taken within 6-8 hours of Librax.

## 2012-08-06 NOTE — Telephone Encounter (Signed)
Patient indicates she is not taking Librax for it did nothing for her. Patient with CPX tomorrow, med will be discussed then

## 2012-08-07 ENCOUNTER — Encounter: Payer: Self-pay | Admitting: Internal Medicine

## 2012-08-07 ENCOUNTER — Ambulatory Visit (INDEPENDENT_AMBULATORY_CARE_PROVIDER_SITE_OTHER): Payer: Medicare Other | Admitting: Internal Medicine

## 2012-08-07 VITALS — BP 118/70 | HR 71 | Temp 98.1°F | Resp 12 | Ht 65.03 in | Wt 123.8 lb

## 2012-08-07 DIAGNOSIS — D509 Iron deficiency anemia, unspecified: Secondary | ICD-10-CM

## 2012-08-07 DIAGNOSIS — K589 Irritable bowel syndrome without diarrhea: Secondary | ICD-10-CM

## 2012-08-07 DIAGNOSIS — I714 Abdominal aortic aneurysm, without rupture, unspecified: Secondary | ICD-10-CM

## 2012-08-07 DIAGNOSIS — R0989 Other specified symptoms and signs involving the circulatory and respiratory systems: Secondary | ICD-10-CM

## 2012-08-07 DIAGNOSIS — I10 Essential (primary) hypertension: Secondary | ICD-10-CM

## 2012-08-07 DIAGNOSIS — R109 Unspecified abdominal pain: Secondary | ICD-10-CM

## 2012-08-07 DIAGNOSIS — R1084 Generalized abdominal pain: Secondary | ICD-10-CM

## 2012-08-07 LAB — HEPATIC FUNCTION PANEL
ALT: 17 U/L (ref 0–35)
AST: 28 U/L (ref 0–37)
Albumin: 3.9 g/dL (ref 3.5–5.2)
Alkaline Phosphatase: 67 U/L (ref 39–117)
Bilirubin, Direct: 0 mg/dL (ref 0.0–0.3)
Total Bilirubin: 0.3 mg/dL (ref 0.3–1.2)
Total Protein: 7.1 g/dL (ref 6.0–8.3)

## 2012-08-07 LAB — CBC WITH DIFFERENTIAL/PLATELET
Basophils Absolute: 0 10*3/uL (ref 0.0–0.1)
Basophils Relative: 0.5 % (ref 0.0–3.0)
Eosinophils Absolute: 0.1 10*3/uL (ref 0.0–0.7)
Eosinophils Relative: 0.9 % (ref 0.0–5.0)
HCT: 39.4 % (ref 36.0–46.0)
Hemoglobin: 12.9 g/dL (ref 12.0–15.0)
Lymphocytes Relative: 25.2 % (ref 12.0–46.0)
Lymphs Abs: 2 10*3/uL (ref 0.7–4.0)
MCHC: 32.7 g/dL (ref 30.0–36.0)
MCV: 83.9 fl (ref 78.0–100.0)
Monocytes Absolute: 0.6 10*3/uL (ref 0.1–1.0)
Monocytes Relative: 7.2 % (ref 3.0–12.0)
Neutro Abs: 5.4 10*3/uL (ref 1.4–7.7)
Neutrophils Relative %: 66.2 % (ref 43.0–77.0)
Platelets: 196 10*3/uL (ref 150.0–400.0)
RBC: 4.7 Mil/uL (ref 3.87–5.11)
RDW: 15.3 % — ABNORMAL HIGH (ref 11.5–14.6)
WBC: 8.1 10*3/uL (ref 4.5–10.5)

## 2012-08-07 LAB — LIPASE: Lipase: 41 U/L (ref 11.0–59.0)

## 2012-08-07 LAB — AMYLASE: Amylase: 63 U/L (ref 27–131)

## 2012-08-07 MED ORDER — METOPROLOL TARTRATE 25 MG PO TABS: ORAL_TABLET | ORAL | Status: DC

## 2012-08-07 NOTE — Patient Instructions (Addendum)
Blood Pressure Goal  Ideally is an AVERAGE < 135/85. This AVERAGE should be calculated from @ least 5-7 BP readings taken @ different times of day on different days of week. You should not respond to isolated BP readings , but rather the AVERAGE for that week.  If you activate My Chart; the results can be released to you as soon as they populate from the lab. If you choose not to use this program; the labs have to be reviewed, copied & mailed   causing a delay in getting the results to you.

## 2012-08-07 NOTE — Progress Notes (Signed)
  Subjective:    Patient ID: Brandi Raymond, female    DOB: 1933/12/30, 79 y.o.   MRN: [id number]  HPI  Medicare Wellness Exam deferred @ her request ; she wants to discuss her abdominal pain. This was evaluated by Dr. Jarold Motto, gastroenterologist. He prescribed generic Librax which was of no benefit. She did not refill the prescription. Probiotic trial also not effective Abdominal pain began 6 mos ago across the entire abdomen; it was described as dull, aching  and radiating into inframammary areas. Severity was up to a level 10 ; the discomfort lasts 24/7. It was improved by change in position,specifically sitting straight up. Occasionally worse with BM but with urination . There were no other definite mitigating factors  Occasional nausea w/o vomiting. Alternating constipation & diarrhea. No melena or rectal bleeding were  described. There was no associated dyspepsia but some intermittent dysphagia. Definite anorexia; no hematemesis. Weight down 3 #.  Dr. Norval Gable December 2013 notes were reviewed. The 12/2008 CT scan of the abdomen was also reviewed. This revealed extensive atherosclerotic vascular disease & a 2.6 cm aortic aneurysm.  There are no current labs in the electronic record except for an A1c of 6 in December 2013. Prior labs date to February 2013.  Family history positive  for significant reflux & colon cancer.    Review of Systems   No fever but intermittent chills & sweats present.  Dysuria, pyuria, and hematuria were absent  It was no associated rash or radicular pain in the area of the discomfort        Objective:   Physical Exam General appearance :thin but adequately nourished; w/o distress.  Eyes: No conjunctival inflammation or scleral icterus is present. Arcus; asymmetric ptosis  Oral exam: Dentures; lips and gums are healthy appearing.There is no oropharyngeal erythema or exudate noted.   Heart:  Normal rate and regular rhythm. S1 and S2 normal without  gallop, murmur, click, rub .S 4. Distant heart sounds     Lungs:Mild rales  present.No increased work of breathing. BS ecreased overall  Abdomen: bowel sounds normal, soft and non-tender without masses, organomegaly or hernias noted.  No guarding or rebound . No AAA palpable  Skin:Warm & dry.  Intact without suspicious lesions or rashes ; no jaundice or tenting  Lymphatic: No lymphadenopathy is noted about the head, neck, axilla  She has mixed arthritic changes in hands. No clubbing, edema or cyanosis present.  Bilateral carotid bruits. No abdominal bruits noted. Pedal pulses decreased           Assessment & Plan:  #1 diffuse abdominal pain with alternating constipation and diarrhea. Irritable bowel suggested; but no response to generic Librax or a probiotic. She is on ACE inhibitor and this might represent bowel edema related to the ACE inhibitor. Appropriate labs will be ordered.  She denies reflux but does have intermittent dysphagia. PPI trial would be indicated.  #2 diffuse episodic cardiovascular disease with small aortic aneurysm. Most important will be blood pressure control. Smoking cessation would be critical; but this has been discussed ad nauseum  for years.  Plan: If there is no response to the PPI and discontinuation of the lisinopril in reference to the pain and dysphagia; GI reevaluation would be indicated.

## 2012-08-22 ENCOUNTER — Encounter: Payer: Self-pay | Admitting: Internal Medicine

## 2012-09-02 ENCOUNTER — Other Ambulatory Visit: Payer: Self-pay | Admitting: Internal Medicine

## 2012-09-02 NOTE — Telephone Encounter (Signed)
Refill: Pravastatin tab 40 mg. Take 1 by mouth daily. 90 day supply

## 2012-09-03 ENCOUNTER — Encounter: Payer: Self-pay | Admitting: Gastroenterology

## 2012-09-09 ENCOUNTER — Telehealth: Payer: Self-pay | Admitting: Internal Medicine

## 2012-09-09 DIAGNOSIS — T887XXA Unspecified adverse effect of drug or medicament, initial encounter: Secondary | ICD-10-CM

## 2012-09-09 DIAGNOSIS — E785 Hyperlipidemia, unspecified: Secondary | ICD-10-CM

## 2012-09-09 MED ORDER — PRAVASTATIN SODIUM 40 MG PO TABS: ORAL_TABLET | ORAL | Status: DC

## 2012-09-09 NOTE — Telephone Encounter (Signed)
RX sent electronically, future lab orders placed for [facility]

## 2012-09-09 NOTE — Telephone Encounter (Signed)
Patient requets rx for pravastatin be sent to [facility].

## 2012-09-10 ENCOUNTER — Encounter: Payer: Self-pay | Admitting: Gastroenterology

## 2012-09-10 ENCOUNTER — Ambulatory Visit (INDEPENDENT_AMBULATORY_CARE_PROVIDER_SITE_OTHER): Payer: Medicare Other | Admitting: Gastroenterology

## 2012-09-10 ENCOUNTER — Other Ambulatory Visit (INDEPENDENT_AMBULATORY_CARE_PROVIDER_SITE_OTHER): Payer: Medicare Other

## 2012-09-10 VITALS — BP 100/74 | HR 61 | Ht 64.5 in | Wt 122.2 lb

## 2012-09-10 DIAGNOSIS — T887XXA Unspecified adverse effect of drug or medicament, initial encounter: Secondary | ICD-10-CM

## 2012-09-10 DIAGNOSIS — F17200 Nicotine dependence, unspecified, uncomplicated: Secondary | ICD-10-CM

## 2012-09-10 DIAGNOSIS — R197 Diarrhea, unspecified: Secondary | ICD-10-CM

## 2012-09-10 DIAGNOSIS — R109 Unspecified abdominal pain: Secondary | ICD-10-CM

## 2012-09-10 DIAGNOSIS — J438 Other emphysema: Secondary | ICD-10-CM

## 2012-09-10 DIAGNOSIS — Z800 Family history of malignant neoplasm of digestive organs: Secondary | ICD-10-CM

## 2012-09-10 DIAGNOSIS — E785 Hyperlipidemia, unspecified: Secondary | ICD-10-CM

## 2012-09-10 DIAGNOSIS — K5730 Diverticulosis of large intestine without perforation or abscess without bleeding: Secondary | ICD-10-CM

## 2012-09-10 DIAGNOSIS — R634 Abnormal weight loss: Secondary | ICD-10-CM

## 2012-09-10 LAB — LIPID PANEL
Cholesterol: 159 mg/dL (ref 0–200)
HDL: 63.4 mg/dL (ref >39.00)
LDL Cholesterol: 79 mg/dL (ref 0–99)
Total CHOL/HDL Ratio: 3
Triglycerides: 83 mg/dL (ref 0.0–149.0)
VLDL: 16.6 mg/dL (ref 0.0–40.0)

## 2012-09-10 LAB — BASIC METABOLIC PANEL
BUN: 15 mg/dL (ref 6–23)
CO2: 30 mEq/L (ref 19–32)
Calcium: 9 mg/dL (ref 8.4–10.5)
Chloride: 104 mEq/L (ref 96–112)
Creatinine, Ser: 0.9 mg/dL (ref 0.4–1.2)
GFR: 61.35 mL/min (ref >60.00)
Glucose, Bld: 92 mg/dL (ref 70–99)
Potassium: 4.7 mEq/L (ref 3.5–5.1)
Sodium: 141 mEq/L (ref 135–145)

## 2012-09-10 LAB — IBC PANEL
Iron: 49 ug/dL (ref 42–145)
Saturation Ratios: 12.5 % — ABNORMAL LOW (ref 20.0–50.0)
Transferrin: 279.8 mg/dL (ref 212.0–360.0)

## 2012-09-10 LAB — HEPATIC FUNCTION PANEL
ALT: 18 U/L (ref 0–35)
AST: 27 U/L (ref 0–37)
Albumin: 3.6 g/dL (ref 3.5–5.2)
Alkaline Phosphatase: 77 U/L (ref 39–117)
Bilirubin, Direct: 0.1 mg/dL (ref 0.0–0.3)
Total Bilirubin: 0.3 mg/dL (ref 0.3–1.2)
Total Protein: 7 g/dL (ref 6.0–8.3)

## 2012-09-10 NOTE — Progress Notes (Signed)
This is a 79 year old somewhat complicated Caucasian female with asymptomatic cholelithiasis.  She been a chronic smoker for many years and has chronic emphysema.  She continues to complain of abdominal gas, bloating, frequent soft stools, with associated nonspecific abdominal discomfort.  She's been treated with antispasmodics, by mouth Xifaxan, and is on probiotics, and daily Aricept for mild dementia.  Review of her labs shows a normal CBC a metabolic and liver profiles.  She continues with postprandial gas, bloating, and is lost 6 pounds in weight over the last 6 months.  She denies any specific food intolerances, but does use some by mouth diet gum.  Beause of her dementia she is followed by Dr. Anne Hahn in neurology.  She also has a history of previous proptosis from chronic thyroid dysfunction, and is on Synthroid 112 micrograms a day per Dr. Marga Melnick.  She does have a history of coronary artery disease with previous MI 1993, is on Lopressor 25 mg a day, Pravachol 40 mg a day, and when necessary nitroglycerin.  The patient denies current symptoms of chest pain with exertion, acid reflux, hepatobiliary complaints, and her breathing allegedly is as good as it has been in several months since being hospitalized for pneumonia.  She is not currently on home oxygen therapy.  The patient was with her daughter today who gave a reliable history.  Does not abuse alcohol, but continues to smoke cigarettes.  Is no history of pancreatitis or hepatitis, she is asymptomatic cholelithiasis.  A CT scan of the abdomen was 4 years ago.  There is no history of known peripheral vascular disease or ischemic bowel disease.  She does have a brother who died from colon cancer.  She's been felt to be a poor candidate for colonoscopy because of a pulmonary insufficiency.  Last colonoscopy in May of 2009 was unremarkable except for diverticulosis.  Endoscopy also was unremarkable including small bowel biopsy.  She has a calcified  granuloma chest x-ray which has been stable.  Current Medications, Allergies, Past Medical History, Past Surgical History, Family History and Social History were reviewed in [employer] record.  ROS: All systems were reviewed and are negative unless otherwise stated in the HPI.... this patient is very stoic and denies even shortness of breath exertion.  However, she does not current cough, sputum production or hemoptysis.  Besides mild dementia she denies other current neurological problems.  Apparently her thyroid function tests have been stabilized.  She is followed closely by ophthalmology.            Physical Exam: Blood pressure 100/74, pulse 61 and regular and weight 122 with a BMI of 20.67.  Oxygen saturation room air is now 95%, previously 90%.  I cannot appreciate stigmata of chronic liver disease.  She does have mild proptosis of the left eye.  Chest shows diminished breath sounds but no wheezes or rhonchi.  She appears to be in a regular rhythm without murmurs gallops or rubs.  Her abdomen shows no distention, organomegaly, masses or tenderness.  Bowel sounds are nonobstructive.  Rectal exam shows no masses, tenderness, and there is soft stool which is of normal color and guaiac-negative.  There is no peripheral edema, swelling joints or phlebitis, and peripheral pulses appear intact.  Her mental status is normal and measures oriented x3 and seems to have a fairly good memory exam, her daughter was present throughout her interview and exam.    Assessment and Plan: Brandi Raymond may have chronic low-grade ischemic bowel  disease associated with her long smoking history.  Other considerations are chronic malabsorption from idiopathic exocrine pancreatic insufficiency vs chronic GI motility disturbance with recurrent bacterial overgrowth syndrome.  However, treatment for bacterial overgrowth syndrome has not alleviated any of her symptoms..  Have scheduled her for CT scan  of the abdomen and pelvis, and hopefully good visualization of her mesenteric vasculature.  Repeat labs ordered for exam including celiac profile, and stool Elastase-1 exam requested to assess exocrine pancreatic function.  I've asked continue all of other medications as listed and reviewed include of probiotic therapy which seems to be helping her symptomatology.  Previous CT scan did show small 2.6 cm. abdominal aortic aneurysm will be rechecked on CT scan currently   Encounter Diagnoses  Name Primary?  . Diarrhea Yes  . Abdominal pain, unspecified site

## 2012-09-10 NOTE — Patient Instructions (Addendum)
Your physician has requested that you go to the basement for the following lab work before leaving today: Anemia panel, Celiac panel, and BMET. Stool studies: Fecal fat quantitatively and Pancreatic elastase.  _____________________________________________________________________________________________________________________  Brandi Raymond have been scheduled for a CT scan of the abdomen and pelvis at [facility] ([address]---this is in the same building as Architectural technologist).   You are scheduled on 09-12-2012 at 2 PM. You should arrive 15 minutes prior to your appointment time for registration. Please follow the written instructions below on the day of your exam:  WARNING: IF YOU ARE ALLERGIC TO IODINE/X-RAY DYE, PLEASE NOTIFY RADIOLOGY IMMEDIATELY AT [phone]! YOU WILL BE GIVEN A 13 HOUR PREMEDICATION PREP.  1) Do not eat or drink anything after 10 AM (4 hours prior to your test) 2) You have been given 2 bottles of oral contrast to drink. The solution may taste better if refrigerated, but do NOT add ice or any other liquid to this solution. Shake well before drinking.    Drink 1 bottle of contrast @ 12 PM (2 hours prior to your exam)  Drink 1 bottle of contrast @ 1 PM (1 hour prior to your exam)  You may take any medications as prescribed with a small amount of water except for the following: Metformin, Glucophage, Glucovance, Avandamet, Riomet, Fortamet, Actoplus Met, Janumet, Glumetza or Metaglip. The above medications must be held the day of the exam AND 48 hours after the exam.  The purpose of you drinking the oral contrast is to aid in the visualization of your intestinal tract. The contrast solution may cause some diarrhea. Before your exam is started, you will be given a small amount of fluid to drink. Depending on your individual set of symptoms, you may also receive an intravenous injection of x-ray contrast/dye. Plan on being at [facility] for 30 minutes or long,  depending on the type of exam you are having performed.  This test typically takes 30-45 minutes to complete.  If you have any questions regarding your exam or if you need to reschedule, you may call the CT department at [phone] between the hours of 8:00 am and 5:00 pm, Monday-Friday.  ________________________________________________________________________

## 2012-09-11 LAB — FERRITIN: Ferritin: 8.2 ng/mL — ABNORMAL LOW (ref 10.0–291.0)

## 2012-09-11 LAB — FOLATE: Folate: >24.8 ng/mL (ref >5.9)

## 2012-09-11 LAB — VITAMIN B12: Vitamin B-12: 916 pg/mL — ABNORMAL HIGH (ref 211–911)

## 2012-09-12 ENCOUNTER — Ambulatory Visit (INDEPENDENT_AMBULATORY_CARE_PROVIDER_SITE_OTHER)
Admission: RE | Admit: 2012-09-12 | Discharge: 2012-09-12 | Disposition: A | Discharge status: Home / Self Care | Payer: Medicare Other | Source: Ambulatory Visit | Attending: Gastroenterology | Admitting: Gastroenterology

## 2012-09-12 ENCOUNTER — Other Ambulatory Visit: Payer: Medicare Other

## 2012-09-12 ENCOUNTER — Encounter: Payer: Self-pay | Admitting: Internal Medicine

## 2012-09-12 DIAGNOSIS — R197 Diarrhea, unspecified: Secondary | ICD-10-CM

## 2012-09-12 DIAGNOSIS — R109 Unspecified abdominal pain: Secondary | ICD-10-CM

## 2012-09-12 MED ORDER — IOHEXOL 300 MG/ML  SOLN
100.0000 mL | Freq: Once | INTRAMUSCULAR | Status: AC | PRN
  Administered 2012-09-12: 100 mL via INTRAVENOUS

## 2012-09-13 LAB — FECAL FAT QUALITATIVE
Free Fatty Acids: NORMAL
NEUTRAL FAT: NORMAL

## 2012-09-15 ENCOUNTER — Encounter: Payer: Self-pay | Admitting: Gastroenterology

## 2012-09-18 ENCOUNTER — Telehealth: Payer: Self-pay | Admitting: Gastroenterology

## 2012-09-18 NOTE — Telephone Encounter (Signed)
Pt also sent a pt note via MY CHART for understanding with lab results. Called her with CT and lab results. Informed her of CT impression, but I explained I could not interpret the scan she will have to wait for Dr Jarold Motto to return. I did inform her of the hernia, but I do not know it's significance and whether she needs to see a Careers adviser. She also had questions about the abnormal  Iron Sat, ferritin labs and again I will have Dr Jarold Motto go over them. Informed her if she hasn't heard from me by afternoon on 09/23/12, call me. Pt stated understanding. Dr Jarold Motto, please give opinion on CT scan and labs results. Thanks.

## 2012-09-18 NOTE — Telephone Encounter (Signed)
Spoke with pt about the results. She will inform daughter.

## 2012-09-20 LAB — PANCREATIC ELASTASE, FECAL: Pancreatic Elastase-1, Stool: 22 mcg/g — ABNORMAL LOW

## 2012-09-21 NOTE — Telephone Encounter (Signed)
NO CAUSE FOR PAIN.Marland KitchenPLAN:;EASE CALL RADIOLOGY FOR ADDENDED REPORT PER MESENTERIC VASCULATURE,PATENCY

## 2012-09-22 ENCOUNTER — Encounter: Payer: Self-pay | Admitting: Gastroenterology

## 2012-09-23 ENCOUNTER — Telehealth: Payer: Self-pay | Admitting: Gastroenterology

## 2012-09-23 ENCOUNTER — Encounter: Payer: Self-pay | Admitting: Gastroenterology

## 2012-09-23 NOTE — Telephone Encounter (Signed)
Calling about her test results.

## 2012-09-23 NOTE — Telephone Encounter (Signed)
Sent Dr Kyung Rudd a staff message for addended report.

## 2012-09-24 ENCOUNTER — Telehealth: Payer: Self-pay | Admitting: *Deleted

## 2012-09-24 ENCOUNTER — Encounter: Payer: Self-pay | Admitting: Gastroenterology

## 2012-09-24 DIAGNOSIS — R109 Unspecified abdominal pain: Secondary | ICD-10-CM

## 2012-09-24 DIAGNOSIS — R195 Other fecal abnormalities: Secondary | ICD-10-CM

## 2012-09-24 DIAGNOSIS — K559 Vascular disorder of intestine, unspecified: Secondary | ICD-10-CM

## 2012-09-24 DIAGNOSIS — R197 Diarrhea, unspecified: Secondary | ICD-10-CM

## 2012-09-24 MED ORDER — PANCRELIPASE (LIP-PROT-AMYL) 36000-114000 UNITS PO CPEP: 2.0000 | ORAL_CAPSULE | Freq: Three times a day (TID) | ORAL | Status: DC

## 2012-09-24 NOTE — Telephone Encounter (Signed)
Message copied by Florene Glen on Tue Sep 24, 2012  9:15 AM ------      Message from: Mardella Layman      Created: Sat Sep 21, 2012 11:56 AM       C/w pancreatic insuff/////needs  2 Zenpep tid with meals ------

## 2012-09-24 NOTE — Telephone Encounter (Signed)
Dr Jarold Motto, pt should receive > 26,000 ius of Lipase; OK to switch to Creon 36,000? Thanks.

## 2012-09-24 NOTE — Telephone Encounter (Signed)
yes

## 2012-09-24 NOTE — Telephone Encounter (Signed)
No word from Dr Kyung Rudd. I called to reach her this am at [facility] [phone], and Dr Britta Mccreedy will look at the procedure for Dr Jarold Motto.

## 2012-09-24 NOTE — Telephone Encounter (Signed)
Informed pt of Pancreatic Elastase results and we are leaving her samples of creon up front to try. We will let her know when other results are received; pt stated understanding.

## 2012-09-24 NOTE — Telephone Encounter (Signed)
Notes Recorded by Mardella Layman, MD on 09/21/2012 at 11:56 AM C/w pancreatic insuff/////needs 2 Zenpep tid with meals Dr Jarold Motto, per her weight, she should receive >26K of Lipase; Zen pep is not sufficient so is it OK to order Creon 36K capsules ? Thanks.

## 2012-09-24 NOTE — Telephone Encounter (Signed)
Notes Recorded by Mardella Layman, MD on 09/24/2012 at 3:56 PM This is c/w mesenteric chronic ischemia...she needs referral to vascular surgery for evaluation..A copy 1 care and cardiologist.This is c/w her chronic GI complaints and otherwise W/U... ------ Explained to pt and daughter, Brandi Raymond about the ischemia and the need for a referral to a vascular surgeon. I will call tomorrow with the referral. They stated understanding. Results sent to DRs [facility] and [facility]ochrein.

## 2012-09-24 NOTE — Telephone Encounter (Signed)
Informed daughter about calling a radiologist to look at the vasculature and patency on the mesenteric vasculature; will call after Dr Jarold Motto reviews. She stated understanding.

## 2012-09-25 ENCOUNTER — Encounter: Payer: Self-pay | Admitting: Gastroenterology

## 2012-09-25 NOTE — Telephone Encounter (Signed)
Spoke with daughter, Lynden Ang to inform her of appt with Dr Josephina Gip with [facility] on 10/08/12 at 3pm; she will inform her mom and pick up the Creon today.

## 2012-09-26 ENCOUNTER — Other Ambulatory Visit: Payer: Self-pay | Admitting: *Deleted

## 2012-09-26 LAB — HM DIABETES EYE EXAM

## 2012-09-30 ENCOUNTER — Other Ambulatory Visit: Payer: Self-pay | Admitting: Internal Medicine

## 2012-10-01 NOTE — Telephone Encounter (Signed)
RX called in .

## 2012-10-07 ENCOUNTER — Encounter: Payer: Self-pay | Admitting: Vascular Surgery

## 2012-10-08 ENCOUNTER — Encounter: Payer: Self-pay | Admitting: Vascular Surgery

## 2012-10-08 ENCOUNTER — Ambulatory Visit (INDEPENDENT_AMBULATORY_CARE_PROVIDER_SITE_OTHER): Payer: Medicare Other | Admitting: Vascular Surgery

## 2012-10-08 VITALS — BP 89/58 | HR 59 | Resp 16 | Ht 64.5 in | Wt 122.0 lb

## 2012-10-08 DIAGNOSIS — K551 Chronic vascular disorders of intestine: Secondary | ICD-10-CM

## 2012-10-08 DIAGNOSIS — R109 Unspecified abdominal pain: Secondary | ICD-10-CM
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
## 2012-10-09 ENCOUNTER — Telehealth: Payer: Self-pay | Admitting: Gastroenterology

## 2012-10-09 ENCOUNTER — Encounter (HOSPITAL_COMMUNITY): Payer: Self-pay | Admitting: Pharmacy Technician

## 2012-10-09 ENCOUNTER — Other Ambulatory Visit: Payer: Self-pay

## 2012-10-09 NOTE — Telephone Encounter (Signed)
Pt's daughter states pt is very upset about having the arteriogram. Daughter has been reading about ischemia and it suggested a limites COLON to r/o ischemic colitis. Pt's last COLON was 12/11/2007 with hyperplastic polyps. Please advise. Thanks.

## 2012-10-09 NOTE — Telephone Encounter (Signed)
Informed daughter that a COLON is not indicated. Daughter stated understanding.

## 2012-10-09 NOTE — Telephone Encounter (Signed)
I spoke with Dr. Fransico Michael not indicated

## 2012-10-15 ENCOUNTER — Encounter (HOSPITAL_COMMUNITY)
Admission: RE | Disposition: A | Discharge status: Home / Self Care | Payer: Self-pay | Source: Ambulatory Visit | Attending: Surgery

## 2012-10-15 ENCOUNTER — Encounter (HOSPITAL_COMMUNITY): Payer: Self-pay | Admitting: *Deleted

## 2012-10-15 ENCOUNTER — Inpatient Hospital Stay (HOSPITAL_COMMUNITY)
Admission: RE | Admit: 2012-10-15 | Discharge: 2012-10-19 | DRG: 299 | Disposition: A | Discharge status: Home / Self Care | Payer: Medicare Other | Source: Ambulatory Visit | Attending: Surgery | Admitting: Surgery

## 2012-10-15 ENCOUNTER — Other Ambulatory Visit: Payer: Self-pay | Admitting: *Deleted

## 2012-10-15 DIAGNOSIS — J449 Chronic obstructive pulmonary disease, unspecified: Secondary | ICD-10-CM | POA: Diagnosis present

## 2012-10-15 DIAGNOSIS — K551 Chronic vascular disorders of intestine: Secondary | ICD-10-CM

## 2012-10-15 DIAGNOSIS — J4489 Other specified chronic obstructive pulmonary disease: Secondary | ICD-10-CM | POA: Diagnosis present

## 2012-10-15 DIAGNOSIS — I7090 Unspecified atherosclerosis: Secondary | ICD-10-CM | POA: Diagnosis present

## 2012-10-15 DIAGNOSIS — D62 Acute posthemorrhagic anemia: Secondary | ICD-10-CM | POA: Diagnosis not present

## 2012-10-15 DIAGNOSIS — F17200 Nicotine dependence, unspecified, uncomplicated: Secondary | ICD-10-CM | POA: Diagnosis present

## 2012-10-15 DIAGNOSIS — I10 Essential (primary) hypertension: Secondary | ICD-10-CM | POA: Diagnosis present

## 2012-10-15 DIAGNOSIS — I774 Celiac artery compression syndrome: Secondary | ICD-10-CM | POA: Diagnosis present

## 2012-10-15 DIAGNOSIS — H0520 Unspecified exophthalmos: Secondary | ICD-10-CM | POA: Diagnosis present

## 2012-10-15 DIAGNOSIS — I7101 Dissection of thoracic aorta: Secondary | ICD-10-CM | POA: Diagnosis not present

## 2012-10-15 DIAGNOSIS — I252 Old myocardial infarction: Secondary | ICD-10-CM

## 2012-10-15 DIAGNOSIS — E559 Vitamin D deficiency, unspecified: Secondary | ICD-10-CM | POA: Diagnosis present

## 2012-10-15 DIAGNOSIS — G8929 Other chronic pain: Secondary | ICD-10-CM | POA: Diagnosis present

## 2012-10-15 DIAGNOSIS — K8020 Calculus of gallbladder without cholecystitis without obstruction: Secondary | ICD-10-CM | POA: Diagnosis present

## 2012-10-15 DIAGNOSIS — E119 Type 2 diabetes mellitus without complications: Secondary | ICD-10-CM | POA: Diagnosis present

## 2012-10-15 DIAGNOSIS — E039 Hypothyroidism, unspecified: Secondary | ICD-10-CM | POA: Diagnosis present

## 2012-10-15 DIAGNOSIS — E876 Hypokalemia: Secondary | ICD-10-CM | POA: Diagnosis present

## 2012-10-15 DIAGNOSIS — R634 Abnormal weight loss: Secondary | ICD-10-CM | POA: Diagnosis present

## 2012-10-15 DIAGNOSIS — G7000 Myasthenia gravis without (acute) exacerbation: Secondary | ICD-10-CM | POA: Diagnosis present

## 2012-10-15 DIAGNOSIS — D509 Iron deficiency anemia, unspecified: Secondary | ICD-10-CM | POA: Diagnosis present

## 2012-10-15 DIAGNOSIS — K219 Gastro-esophageal reflux disease without esophagitis: Secondary | ICD-10-CM | POA: Diagnosis present

## 2012-10-15 DIAGNOSIS — I498 Other specified cardiac arrhythmias: Secondary | ICD-10-CM | POA: Diagnosis present

## 2012-10-15 DIAGNOSIS — I7389 Other specified peripheral vascular diseases: Principal | ICD-10-CM | POA: Diagnosis present

## 2012-10-15 DIAGNOSIS — K449 Diaphragmatic hernia without obstruction or gangrene: Secondary | ICD-10-CM | POA: Diagnosis present

## 2012-10-15 DIAGNOSIS — M549 Dorsalgia, unspecified: Secondary | ICD-10-CM | POA: Diagnosis present

## 2012-10-15 DIAGNOSIS — E785 Hyperlipidemia, unspecified: Secondary | ICD-10-CM | POA: Diagnosis present

## 2012-10-15 DIAGNOSIS — I71019 Dissection of thoracic aorta, unspecified: Secondary | ICD-10-CM | POA: Diagnosis not present

## 2012-10-15 DIAGNOSIS — I2510 Atherosclerotic heart disease of native coronary artery without angina pectoris: Secondary | ICD-10-CM | POA: Diagnosis present

## 2012-10-15 DIAGNOSIS — IMO0002 Reserved for concepts with insufficient information to code with codable children: Secondary | ICD-10-CM

## 2012-10-15 HISTORY — PX: ABDOMINAL AORTAGRAM: SHX5454

## 2012-10-15 HISTORY — PX: VISCERAL ANGIOGRAM: SHX5515

## 2012-10-15 LAB — COMPREHENSIVE METABOLIC PANEL
ALT: 12 U/L (ref 0–35)
AST: 22 U/L (ref 0–37)
Albumin: 3.1 g/dL — ABNORMAL LOW (ref 3.5–5.2)
Alkaline Phosphatase: 67 U/L (ref 39–117)
BUN: 17 mg/dL (ref 6–23)
CO2: 27 mEq/L (ref 19–32)
Calcium: 8.2 mg/dL — ABNORMAL LOW (ref 8.4–10.5)
Chloride: 107 mEq/L (ref 96–112)
Creatinine, Ser: 0.7 mg/dL (ref 0.50–1.10)
GFR calc Af Amer: >90 mL/min (ref >90)
GFR calc non Af Amer: 81 mL/min — ABNORMAL LOW (ref >90)
Glucose, Bld: 105 mg/dL — ABNORMAL HIGH (ref 70–99)
Potassium: 4.3 mEq/L (ref 3.5–5.1)
Sodium: 141 mEq/L (ref 135–145)
Total Bilirubin: 0.3 mg/dL (ref 0.3–1.2)
Total Protein: 6 g/dL (ref 6.0–8.3)

## 2012-10-15 LAB — POCT ACTIVATED CLOTTING TIME
Activated Clotting Time: 225 seconds
Activated Clotting Time: 198 seconds
Activated Clotting Time: 176 seconds

## 2012-10-15 LAB — POCT I-STAT, CHEM 8
BUN: 20 mg/dL (ref 6–23)
Calcium, Ion: 1.2 mmol/L (ref 1.13–1.30)
Chloride: 104 mEq/L (ref 96–112)
Creatinine, Ser: 0.8 mg/dL (ref 0.50–1.10)
Glucose, Bld: 98 mg/dL (ref 70–99)
HCT: 41 % (ref 36.0–46.0)
Hemoglobin: 13.9 g/dL (ref 12.0–15.0)
Potassium: 4.2 mEq/L (ref 3.5–5.1)
Sodium: 143 mEq/L (ref 135–145)
TCO2: 31 mmol/L (ref 0–100)

## 2012-10-15 LAB — CBC
HCT: 36.1 % (ref 36.0–46.0)
Hemoglobin: 11.6 g/dL — ABNORMAL LOW (ref 12.0–15.0)
MCH: 27.1 pg (ref 26.0–34.0)
MCHC: 32.1 g/dL (ref 30.0–36.0)
MCV: 84.3 fL (ref 78.0–100.0)
Platelets: 153 10*3/uL (ref 150–400)
RBC: 4.28 MIL/uL (ref 3.87–5.11)
RDW: 14.5 % (ref 11.5–15.5)
WBC: 10.8 10*3/uL — ABNORMAL HIGH (ref 4.0–10.5)

## 2012-10-15 LAB — CK TOTAL AND CKMB (NOT AT ARMC)
CK, MB: 3.3 ng/mL (ref 0.3–4.0)
Relative Index: INVALID (ref 0.0–2.5)
Total CK: 79 U/L (ref 7–177)

## 2012-10-15 SURGERY — ABDOMINAL AORTAGRAM
Anesthesia: LOCAL

## 2012-10-15 MED ORDER — SODIUM CHLORIDE 0.9 % IV SOLN
INTRAVENOUS | Status: DC
  Administered 2012-10-15: 06:00:00 via INTRAVENOUS

## 2012-10-15 MED ORDER — HEPARIN SODIUM (PORCINE) 1000 UNIT/ML IJ SOLN
INTRAMUSCULAR | Status: AC
  Filled 2012-10-15: qty 1

## 2012-10-15 MED ORDER — ASPIRIN 81 MG PO TABS: 81.0000 mg | ORAL_TABLET | Freq: Every day | ORAL | Status: DC

## 2012-10-15 MED ORDER — LABETALOL HCL 5 MG/ML IV SOLN
10.0000 mg | INTRAVENOUS | Status: DC | PRN
  Filled 2012-10-15: qty 4

## 2012-10-15 MED ORDER — PANCRELIPASE (LIP-PROT-AMYL) 36000-114000 UNITS PO CPEP: 2.0000 | ORAL_CAPSULE | Freq: Three times a day (TID) | ORAL | Status: DC

## 2012-10-15 MED ORDER — NITROGLYCERIN 0.3 MG SL SUBL
0.3000 mg | SUBLINGUAL_TABLET | SUBLINGUAL | Status: DC | PRN
  Filled 2012-10-15: qty 100

## 2012-10-15 MED ORDER — FENTANYL CITRATE 0.05 MG/ML IJ SOLN
25.0000 ug | INTRAMUSCULAR | Status: DC | PRN
  Administered 2012-10-15 – 2012-10-17 (×9): 25 ug via INTRAVENOUS
  Filled 2012-10-15 (×5): qty 2

## 2012-10-15 MED ORDER — LEVOTHYROXINE SODIUM 112 MCG PO TABS
56.0000 ug | ORAL_TABLET | ORAL | Status: DC
  Administered 2012-10-17: 56 ug via ORAL
  Filled 2012-10-15: qty 0.5

## 2012-10-15 MED ORDER — FENTANYL CITRATE 0.05 MG/ML IJ SOLN
25.0000 ug | Freq: Once | INTRAMUSCULAR | Status: AC
  Administered 2012-10-15: 10:00:00 via INTRAVENOUS

## 2012-10-15 MED ORDER — FENTANYL CITRATE 0.05 MG/ML IJ SOLN
INTRAMUSCULAR | Status: AC
  Filled 2012-10-15: qty 2

## 2012-10-15 MED ORDER — TRAMADOL HCL 50 MG PO TABS
50.0000 mg | ORAL_TABLET | Freq: Four times a day (QID) | ORAL | Status: DC | PRN
  Administered 2012-10-15 – 2012-10-16 (×3): 50 mg via ORAL
  Filled 2012-10-15 (×3): qty 1

## 2012-10-15 MED ORDER — ALUM & MAG HYDROXIDE-SIMETH 200-200-20 MG/5ML PO SUSP: 15.0000 mL | ORAL | Status: DC | PRN

## 2012-10-15 MED ORDER — METOPROLOL TARTRATE 1 MG/ML IV SOLN: 2.0000 mg | INTRAVENOUS | Status: DC | PRN

## 2012-10-15 MED ORDER — GABAPENTIN 300 MG PO CAPS: 300.0000 mg | ORAL_CAPSULE | Freq: Three times a day (TID) | ORAL | Status: DC

## 2012-10-15 MED ORDER — MORPHINE SULFATE 2 MG/ML IJ SOLN
2.0000 mg | INTRAMUSCULAR | Status: DC | PRN
  Administered 2012-10-15: 17:00:00 2 mg via INTRAVENOUS
  Filled 2012-10-15: qty 1

## 2012-10-15 MED ORDER — HYDRALAZINE HCL 20 MG/ML IJ SOLN: 10.0000 mg | INTRAMUSCULAR | Status: DC | PRN

## 2012-10-15 MED ORDER — ENOXAPARIN SODIUM 40 MG/0.4ML ~~LOC~~ SOLN
40.0000 mg | SUBCUTANEOUS | Status: DC
  Administered 2012-10-16: 40 mg via SUBCUTANEOUS
  Filled 2012-10-15 (×2): qty 0.4

## 2012-10-15 MED ORDER — ONDANSETRON HCL 4 MG/2ML IJ SOLN: 4.0000 mg | Freq: Four times a day (QID) | INTRAMUSCULAR | Status: DC | PRN

## 2012-10-15 MED ORDER — ACETAMINOPHEN 650 MG RE SUPP: 325.0000 mg | RECTAL | Status: DC | PRN

## 2012-10-15 MED ORDER — ONDANSETRON HCL 4 MG/2ML IJ SOLN
INTRAMUSCULAR | Status: AC
  Filled 2012-10-15: qty 2

## 2012-10-15 MED ORDER — SERTRALINE HCL 50 MG PO TABS
50.0000 mg | ORAL_TABLET | Freq: Every day | ORAL | Status: DC
  Administered 2012-10-16 – 2012-10-19 (×4): 50 mg via ORAL
  Filled 2012-10-15 (×5): qty 1

## 2012-10-15 MED ORDER — PHENOL 1.4 % MT LIQD: 1.0000 | OROMUCOSAL | Status: DC | PRN

## 2012-10-15 MED ORDER — ASPIRIN 81 MG PO CHEW
81.0000 mg | CHEWABLE_TABLET | Freq: Every day | ORAL | Status: DC
  Administered 2012-10-16 – 2012-10-19 (×4): 81 mg via ORAL
  Filled 2012-10-15 (×4): qty 1

## 2012-10-15 MED ORDER — LABETALOL HCL 5 MG/ML IV SOLN: 10.0000 mg | INTRAVENOUS | Status: DC | PRN

## 2012-10-15 MED ORDER — GABAPENTIN 300 MG PO CAPS
600.0000 mg | ORAL_CAPSULE | Freq: Every day | ORAL | Status: DC
  Administered 2012-10-16 – 2012-10-18 (×3): 600 mg via ORAL
  Filled 2012-10-15 (×5): qty 2

## 2012-10-15 MED ORDER — ONDANSETRON HCL 4 MG/2ML IJ SOLN
INTRAMUSCULAR | Status: AC
  Administered 2012-10-15: 20:00:00 4 mg
  Filled 2012-10-15: qty 2

## 2012-10-15 MED ORDER — CLONAZEPAM 0.5 MG PO TABS
0.5000 mg | ORAL_TABLET | Freq: Every evening | ORAL | Status: DC | PRN
  Administered 2012-10-15 – 2012-10-17 (×3): 1 mg via ORAL
  Filled 2012-10-15 (×3): qty 2

## 2012-10-15 MED ORDER — GABAPENTIN 300 MG PO CAPS
300.0000 mg | ORAL_CAPSULE | Freq: Two times a day (BID) | ORAL | Status: DC
  Administered 2012-10-16 – 2012-10-19 (×7): 300 mg via ORAL
  Filled 2012-10-15 (×9): qty 1

## 2012-10-15 MED ORDER — OXYCODONE HCL 5 MG PO TABS
5.0000 mg | ORAL_TABLET | ORAL | Status: DC | PRN
Start: 2012-10-15 — End: 2012-10-16
  Administered 2012-10-15: 5 mg via ORAL
  Filled 2012-10-15: qty 2
  Filled 2012-10-15: qty 1

## 2012-10-15 MED ORDER — ONDANSETRON HCL 4 MG/2ML IJ SOLN
4.0000 mg | INTRAMUSCULAR | Status: DC | PRN
  Administered 2012-10-16 (×3): 4 mg via INTRAVENOUS
  Filled 2012-10-15 (×3): qty 2

## 2012-10-15 MED ORDER — SIMVASTATIN 20 MG PO TABS
20.0000 mg | ORAL_TABLET | Freq: Every day | ORAL | Status: DC
  Administered 2012-10-16 – 2012-10-18 (×3): 20 mg via ORAL
  Filled 2012-10-15 (×5): qty 1

## 2012-10-15 MED ORDER — DONEPEZIL HCL 10 MG PO TABS
10.0000 mg | ORAL_TABLET | Freq: Every day | ORAL | Status: DC
  Administered 2012-10-16 – 2012-10-18 (×3): 10 mg via ORAL
  Filled 2012-10-15 (×5): qty 1

## 2012-10-15 MED ORDER — GUAIFENESIN-DM 100-10 MG/5ML PO SYRP: 15.0000 mL | ORAL_SOLUTION | ORAL | Status: DC | PRN

## 2012-10-15 MED ORDER — LIDOCAINE HCL (PF) 1 % IJ SOLN
INTRAMUSCULAR | Status: AC
  Filled 2012-10-15: qty 30

## 2012-10-15 MED ORDER — LEVOTHYROXINE SODIUM 112 MCG PO TABS
112.0000 ug | ORAL_TABLET | ORAL | Status: DC
  Administered 2012-10-16 – 2012-10-19 (×3): 112 ug via ORAL
  Filled 2012-10-15 (×4): qty 1

## 2012-10-15 MED ORDER — PHENOL 1.4 % MT LIQD
1.0000 | OROMUCOSAL | Status: DC | PRN
  Filled 2012-10-15: qty 177

## 2012-10-15 MED ORDER — ONDANSETRON HCL 4 MG/2ML IJ SOLN
4.0000 mg | Freq: Four times a day (QID) | INTRAMUSCULAR | Status: DC | PRN
  Administered 2012-10-15: 4 mg via INTRAVENOUS

## 2012-10-15 MED ORDER — METOPROLOL TARTRATE 25 MG PO TABS
25.0000 mg | ORAL_TABLET | Freq: Two times a day (BID) | ORAL | Status: DC
  Administered 2012-10-15 – 2012-10-19 (×8): 25 mg via ORAL
  Filled 2012-10-15 (×9): qty 1

## 2012-10-15 MED ORDER — HEPARIN (PORCINE) IN NACL 2-0.9 UNIT/ML-% IJ SOLN
INTRAMUSCULAR | Status: AC
  Filled 2012-10-15: qty 1000

## 2012-10-15 MED ORDER — PANTOPRAZOLE SODIUM 40 MG PO TBEC
40.0000 mg | DELAYED_RELEASE_TABLET | Freq: Every day | ORAL | Status: DC
  Administered 2012-10-16 – 2012-10-19 (×4): 40 mg via ORAL
  Filled 2012-10-15 (×4): qty 1

## 2012-10-15 MED ORDER — SODIUM CHLORIDE 0.9 % IV SOLN
1.0000 mL/kg/h | INTRAVENOUS | Status: DC
  Administered 2012-10-15: 1 mL/kg/h via INTRAVENOUS

## 2012-10-15 MED ORDER — HYDRALAZINE HCL 20 MG/ML IJ SOLN
10.0000 mg | INTRAMUSCULAR | Status: DC | PRN
  Filled 2012-10-15: qty 1

## 2012-10-15 MED ORDER — POTASSIUM CHLORIDE CRYS ER 20 MEQ PO TBCR
20.0000 meq | EXTENDED_RELEASE_TABLET | Freq: Once | ORAL | Status: DC
  Filled 2012-10-15: qty 2

## 2012-10-15 MED ORDER — SODIUM CHLORIDE 0.9 % IV SOLN
INTRAVENOUS | Status: DC
  Administered 2012-10-15: 15:00:00 via INTRAVENOUS

## 2012-10-15 MED ORDER — ACETAMINOPHEN 325 MG PO TABS: 325.0000 mg | ORAL_TABLET | ORAL | Status: DC | PRN

## 2012-10-15 MED ORDER — ONDANSETRON HCL 4 MG/2ML IJ SOLN
4.0000 mg | Freq: Once | INTRAMUSCULAR | Status: AC
  Administered 2012-10-15: 4 mg via INTRAVENOUS
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
## 2012-10-15 NOTE — Progress Notes (Signed)
Pt having significant back pain following cath.  This has not been well controlled with narcotics.  Her CBC, BMET and cardiac nezymes were unremarkable as was her EKG.  Legs are well perfused and her right groin is soft.  I suspect she is having back pain from lying flat on the table for her procedure.  She is also vomiting and having episodes of asymptomatic bradycardia which I suspect is vagal.  My plan is to hydrate her and treat her symptomatically.  If this persists she will need a CTA to r/o dissection,etc.  With already having received contrast today for her cath, I would like to hold of with this for now.  Family updated at bedside.  Brandi Raymond

## 2012-10-15 NOTE — Interval H&P Note (Signed)
History and Physical Interval Note:  10/15/2012 7:39 AM  Brandi Raymond  has presented today for surgery, with the diagnosis of PVD  The various methods of treatment have been discussed with the patient and family. After consideration of risks, benefits and other options for treatment, the patient has consented to  Procedure(s): ABDOMINAL AORTAGRAM (N/A) VISCERAL ANGIOGRAM (N/A) as a surgical intervention .  The patient's history has been reviewed, patient examined, no change in status, stable for surgery.  I have reviewed the patient's chart and labs.  Questions were answered to the patient's satisfaction.     Luisana Lutzke IV, V. WELLS

## 2012-10-15 NOTE — Op Note (Signed)
[facility] [place]  Patient name: Brandi Raymond MRN: [id number] DOB: 1934-01-14 Sex: female  10/15/2012 Pre-operative Diagnosis: Possible mesenteric stenosis Post-operative diagnosis:  Same Surgeon:  Jorge Ny Procedure Performed:  1.  ultrasound-guided access, right femoral artery  2.  abdominal aortogram  3.  selective first order catheterization, celiac artery  4.  celiac artery angiogram  5.  selective first-order catheterization, superior mesenteric artery  6.  superior mesenteric artery angiogram    Indications:  The patient has chronic back pain associated with weight loss. She does have postprandial pain. CT scan indicated possible mesenteric vascular etiology. She comes in for further evaluation and possible intervention.  Procedure:  The patient was identified in the holding area and taken to room 8.  The patient was then placed supine on the table and prepped and draped in the usual sterile fashion.  A time out was called.  Ultrasound was used to evaluate the right common femoral artery.  It was patent .  A digital ultrasound image was acquired.  A micropuncture needle was used to access the right common femoral artery under ultrasound guidance.  An 018 wire was advanced without resistance and a micropuncture sheath was placed.  The 018 wire was removed and a benson wire was placed.  The micropuncture sheath was exchanged for a 5 french sheath.  An omniflush catheter was advanced over the wire to the level of T12. An abdominal aortogram in the AP and lateral projections were performed. Next using a SOS catheter the celiac and superior mesenteric artery were individually cannulated. Celiac and superior mesenteric artery angiograms were performed with the catheter in the respective vessel.  Findings:   Aortogram:  The visualized portions of the supra celiac aorta showed no significant stenosis. Bilateral renal arteries are widely patent. The  infrarenal abdominal aorta is heavily calcified. There is a small aneurysm at the level of the inferior mesenteric artery. The inferior mesenteric artery appears to be widely patent with a large arc of Riolan which feeds into the superior mesenteric artery.  Celiac artery:  There is approximately 80% stenosis at the origin of the celiac artery. Distal branching appears to be widely  Superior mesenteric artery:  Approximately 90% stenosis is identified within the proximal portion of the superior mesenteric artery. The distal branches appear to be patent.  Intervention:  After the above images were obtained, decided to proceed with intervention on the superior mesenteric artery. I upsized to a 6 [place] sheath. I tried to advance a high and a guide catheter into the superior mesenteric artery, however because of the angulation I could not advance this into the artery. I tried to buddy wire a 014 wire next to the 035 Bentson but again could not get the wire to stay in the artery. From the groin, I did not feel that a 014 platform would be stable, and therefore I switched out to an 035 platform. A 7 French Ansel 1 sheath was inserted. A SOS catheter was again used to cannulate the superior mesenteric artery. I tried to advance a straight tipped Amplatz wire into the artery but because of the angulation I did not feel like to get adequate purchase. At this point I did not feel that point from the groin would be the best option to treat her mesenteric stenosis. The patient will be brought back for brachial access and treatment of her proximal mesenteric artery stenosis using an 014 platform.  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  The patient we brought back at a later date for an attempt of stenting of the superior mesenteric artery from the left brachial access.    Juleen China, M.D. [facility] [place] Office: [phone] Pager:  [phone]

## 2012-10-16 ENCOUNTER — Inpatient Hospital Stay (HOSPITAL_COMMUNITY): Payer: Medicare Other

## 2012-10-16 ENCOUNTER — Encounter (HOSPITAL_COMMUNITY): Payer: Self-pay | Admitting: Radiology

## 2012-10-16 DIAGNOSIS — IMO0002 Reserved for concepts with insufficient information to code with codable children: Secondary | ICD-10-CM

## 2012-10-16 LAB — COMPREHENSIVE METABOLIC PANEL WITH GFR
ALT: 23 U/L (ref 0–35)
AST: 32 U/L (ref 0–37)
Albumin: 2.9 g/dL — ABNORMAL LOW (ref 3.5–5.2)
Alkaline Phosphatase: 60 U/L (ref 39–117)
BUN: 13 mg/dL (ref 6–23)
CO2: 24 meq/L (ref 19–32)
Calcium: 8.2 mg/dL — ABNORMAL LOW (ref 8.4–10.5)
Chloride: 104 meq/L (ref 96–112)
Creatinine, Ser: 0.61 mg/dL (ref 0.50–1.10)
GFR calc Af Amer: >90 mL/min
GFR calc non Af Amer: 85 mL/min — ABNORMAL LOW
Glucose, Bld: 123 mg/dL — ABNORMAL HIGH (ref 70–99)
Potassium: 3.5 meq/L (ref 3.5–5.1)
Sodium: 137 meq/L (ref 135–145)
Total Bilirubin: 0.3 mg/dL (ref 0.3–1.2)
Total Protein: 5.9 g/dL — ABNORMAL LOW (ref 6.0–8.3)

## 2012-10-16 LAB — BASIC METABOLIC PANEL
BUN: 14 mg/dL (ref 6–23)
CO2: 27 mEq/L (ref 19–32)
Calcium: 8.3 mg/dL — ABNORMAL LOW (ref 8.4–10.5)
Chloride: 104 mEq/L (ref 96–112)
Creatinine, Ser: 0.61 mg/dL (ref 0.50–1.10)
GFR calc Af Amer: >90 mL/min (ref >90)
GFR calc non Af Amer: 85 mL/min — ABNORMAL LOW (ref >90)
Glucose, Bld: 148 mg/dL — ABNORMAL HIGH (ref 70–99)
Potassium: 3.5 mEq/L (ref 3.5–5.1)
Sodium: 140 mEq/L (ref 135–145)

## 2012-10-16 LAB — CBC
HCT: 32.7 % — ABNORMAL LOW (ref 36.0–46.0)
HCT: 29.6 % — ABNORMAL LOW (ref 36.0–46.0)
Hemoglobin: 10.6 g/dL — ABNORMAL LOW (ref 12.0–15.0)
Hemoglobin: 9.8 g/dL — ABNORMAL LOW (ref 12.0–15.0)
MCH: 27.3 pg (ref 26.0–34.0)
MCH: 27.7 pg (ref 26.0–34.0)
MCHC: 32.4 g/dL (ref 30.0–36.0)
MCHC: 33.1 g/dL (ref 30.0–36.0)
MCV: 84.3 fL (ref 78.0–100.0)
MCV: 83.6 fL (ref 78.0–100.0)
Platelets: 124 10*3/uL — ABNORMAL LOW (ref 150–400)
Platelets: 122 K/uL — ABNORMAL LOW (ref 150–400)
RBC: 3.88 MIL/uL (ref 3.87–5.11)
RBC: 3.54 MIL/uL — ABNORMAL LOW (ref 3.87–5.11)
RDW: 14.4 % (ref 11.5–15.5)
RDW: 14.6 % (ref 11.5–15.5)
WBC: 12.4 10*3/uL — ABNORMAL HIGH (ref 4.0–10.5)
WBC: 14.7 K/uL — ABNORMAL HIGH (ref 4.0–10.5)

## 2012-10-16 LAB — TROPONIN I: Troponin I: <0.3 ng/mL (ref <0.30)

## 2012-10-16 LAB — POCT ACTIVATED CLOTTING TIME: Activated Clotting Time: 263 seconds

## 2012-10-16 MED ORDER — NITROPRUSSIDE SODIUM 25 MG/ML IV SOLN
0.2500 ug/kg/min | INTRAVENOUS | Status: DC | PRN
  Administered 2012-10-17: 2 ug/kg/min via INTRAVENOUS
  Administered 2012-10-17: 0.25 ug/kg/min via INTRAVENOUS
  Administered 2012-10-18: 1.25 ug/kg/min via INTRAVENOUS
  Administered 2012-10-18 – 2012-10-19 (×2): 1 ug/kg/min via INTRAVENOUS
  Filled 2012-10-16 (×5): qty 2

## 2012-10-16 MED ORDER — IOHEXOL 350 MG/ML SOLN
100.0000 mL | Freq: Once | INTRAVENOUS | Status: AC | PRN
  Administered 2012-10-16: 100 mL via INTRAVENOUS

## 2012-10-16 MED ORDER — BENZONATATE 100 MG PO CAPS
100.0000 mg | ORAL_CAPSULE | Freq: Two times a day (BID) | ORAL | Status: DC
  Administered 2012-10-16 – 2012-10-19 (×6): 100 mg via ORAL
  Filled 2012-10-16 (×7): qty 1

## 2012-10-16 MED ORDER — MENTHOL 3 MG MT LOZG
1.0000 | LOZENGE | OROMUCOSAL | Status: DC | PRN
  Administered 2012-10-16: 3 mg via ORAL
  Filled 2012-10-16: qty 9

## 2012-10-16 MED ORDER — TRAMADOL HCL 50 MG PO TABS
50.0000 mg | ORAL_TABLET | ORAL | Status: DC | PRN
  Administered 2012-10-16 – 2012-10-19 (×8): 50 mg via ORAL
  Filled 2012-10-16 (×8): qty 1

## 2012-10-16 MED ORDER — SODIUM CHLORIDE 0.9 % IV SOLN
INTRAVENOUS | Status: DC
  Administered 2012-10-16: 17:00:00 via INTRAVENOUS

## 2012-10-16 NOTE — Progress Notes (Signed)
      [facility]           [facility] [place]  Called by Dr. Myra Gianotti - CTA showed dissection in the descending aorta. Pt states she had much less back pain.  Filed Vitals:   10/16/12 0527 10/16/12 0600 10/16/12 0800 10/16/12 1223  BP: 126/75 148/73 124/75 117/63  Pulse:  62 63 57  Temp:   97.3 F (36.3 C) 97.4 F (36.3 C)  TempSrc:   Oral Oral  Resp:      Height:      Weight:      SpO2:  98% 97% 91%    A/P back pain post angio for mesenteric ischemia yesterday CTA done today for persistent back pain shows Descending Aortic dissection.  We will transfer to ICU for strict HTN control - will use nipride to keep SBP<120 per Dr. Myra Gianotti Stop lovenox at this point - SCD's Bedrest SCD

## 2012-10-16 NOTE — Progress Notes (Signed)
Utilization Review Completed Quaniya Damas J. Nykerria Macconnell, RN, BSN, NCM [phone]  

## 2012-10-16 NOTE — Progress Notes (Signed)
[facility] [place]  Subjective  -s/p abdominal angiogram for possible mesenteric ischemia  Still complaining of chest and back pain Was able to ambulate in halls last night Nausea improved but minimal PO  Physical Exam:  Abdomen soft Palpable femoral pulses Mild right groin ecchymosis Abdomen soft Respirations non-labored    Assessment/Plan:    The patient wanted to go home this morning, however since she still had back pain, I was uncomfortable with this without getting a CT scan to rule out a dissection or other serious pathology  As the source of her pain.  I have reviewed her CT scan and it is most consistent with an intramural hematoma of her descending thoracic aorta.  I had an extensive conversation with the patient and her family at the bedside explaining the findings of the CT scan.  At this point, I feel this should be managed as we would a spontaneous IMH.with blood pressure control of SBP< 120, using oral and IV meds.  She will need transfer to the ICU for careful monitoring.  Fortunately, her pain this afternoon is much improved and she has been able to keep food down.  I told her that as a result of the IMH, I would not recommend another attempt at SMA stenting for at least 1 month.  Prior to that, she will need a repeat CT scan to make sure there are no additional complicating factors.  All present were in agreement.   Lynk Marti IV, VAnner Crete 10/16/2012 8:41 PM --  Filed Vitals:   10/16/12 2000  BP: 90/74  Pulse: 67  Temp:   Resp: 17    Intake/Output Summary (Last 24 hours) at 10/16/12 2041 Last data filed at 10/16/12 2000  Gross per 24 hour  Intake   1300 ml  Output    600 ml  Net    700 ml     Laboratory CBC    Component Value Date/Time   WBC 14.7* 10/16/2012 1531   HGB 9.8* 10/16/2012 1531   HCT 29.6* 10/16/2012 1531   PLT 122* 10/16/2012 1531    BMET    Component Value Date/Time   NA 137 10/16/2012 1531   K 3.5 10/16/2012 1531   CL 104 10/16/2012 1531   CO2 24 10/16/2012 1531   GLUCOSE 123* 10/16/2012 1531   BUN 13 10/16/2012 1531   CREATININE 0.61 10/16/2012 1531   CALCIUM 8.2* 10/16/2012 1531   GFRNONAA 85* 10/16/2012 1531   GFRAA >90 10/16/2012 1531    COAG Lab Results  Component Value Date   INR 0.9 01/07/2009   No results found for this basename: PTT    Antibiotics Anti-infectives   None       V. Charlena Cross, M.D. [facility] [place] Office: [phone] Pager:  [phone]

## 2012-10-17 ENCOUNTER — Inpatient Hospital Stay (HOSPITAL_COMMUNITY): Payer: Medicare Other

## 2012-10-17 LAB — BASIC METABOLIC PANEL
BUN: 9 mg/dL (ref 6–23)
CO2: 27 mEq/L (ref 19–32)
Calcium: 8 mg/dL — ABNORMAL LOW (ref 8.4–10.5)
Chloride: 103 mEq/L (ref 96–112)
Creatinine, Ser: 0.59 mg/dL (ref 0.50–1.10)
GFR calc Af Amer: >90 mL/min (ref >90)
GFR calc non Af Amer: 86 mL/min — ABNORMAL LOW (ref >90)
Glucose, Bld: 121 mg/dL — ABNORMAL HIGH (ref 70–99)
Potassium: 3.2 mEq/L — ABNORMAL LOW (ref 3.5–5.1)
Sodium: 135 mEq/L (ref 135–145)

## 2012-10-17 LAB — CBC
HCT: 27.5 % — ABNORMAL LOW (ref 36.0–46.0)
Hemoglobin: 8.8 g/dL — ABNORMAL LOW (ref 12.0–15.0)
MCH: 27 pg (ref 26.0–34.0)
MCHC: 32 g/dL (ref 30.0–36.0)
MCV: 84.4 fL (ref 78.0–100.0)
Platelets: 100 10*3/uL — ABNORMAL LOW (ref 150–400)
RBC: 3.26 MIL/uL — ABNORMAL LOW (ref 3.87–5.11)
RDW: 14.5 % (ref 11.5–15.5)
WBC: 13.6 10*3/uL — ABNORMAL HIGH (ref 4.0–10.5)

## 2012-10-17 LAB — HEMOGLOBIN AND HEMATOCRIT, BLOOD
HCT: 26 % — ABNORMAL LOW (ref 36.0–46.0)
Hemoglobin: 8.6 g/dL — ABNORMAL LOW (ref 12.0–15.0)

## 2012-10-17 LAB — PREPARE RBC (CROSSMATCH)

## 2012-10-17 LAB — MRSA PCR SCREENING: MRSA by PCR: NEGATIVE

## 2012-10-17 MED ORDER — POTASSIUM CHLORIDE CRYS ER 20 MEQ PO TBCR
20.0000 meq | EXTENDED_RELEASE_TABLET | Freq: Every day | ORAL | Status: DC | PRN
  Filled 2012-10-17: qty 1

## 2012-10-17 MED ORDER — POTASSIUM CHLORIDE CRYS ER 20 MEQ PO TBCR
20.0000 meq | EXTENDED_RELEASE_TABLET | Freq: Once | ORAL | Status: AC
  Administered 2012-10-17: 40 meq via ORAL

## 2012-10-17 MED ORDER — ENSURE COMPLETE PO LIQD
237.0000 mL | Freq: Two times a day (BID) | ORAL | Status: DC
  Administered 2012-10-17 – 2012-10-18 (×2): 237 mL via ORAL

## 2012-10-17 MED ORDER — POTASSIUM CHLORIDE 10 MEQ/100ML IV SOLN: 10.0000 meq | INTRAVENOUS | Status: DC

## 2012-10-17 MED ORDER — LISINOPRIL 2.5 MG PO TABS
2.5000 mg | ORAL_TABLET | Freq: Every day | ORAL | Status: DC
  Administered 2012-10-17 – 2012-10-18 (×2): 2.5 mg via ORAL
  Filled 2012-10-17 (×2): qty 1

## 2012-10-17 NOTE — Progress Notes (Signed)
eLink Physician Progress Note and Electrolyte Replacement  Patient Name: Brandi Raymond DOB: [birth year] MRN: [id number]  Date of Service  10/17/2012   HPI/Events of Note    Recent Labs Lab 10/15/12 0607 10/15/12 1325 10/16/12 0430 10/16/12 1531 10/17/12 0255  NA 143 141 140 137 135  K 4.2 4.3 3.5 3.5 3.2*  CL 104 107 104 104 103  CO2  --  27 27 24 27   GLUCOSE 98 105* 148* 123* 121*  BUN 20 17 14 13 9   CREATININE 0.80 0.70 0.61 0.61 0.59  CALCIUM  --  8.2* 8.3* 8.2* 8.0*    Estimated Creatinine Clearance: 51.6 ml/min (by C-G formula based on Cr of 0.59).  Intake/Output     04/02 0701 - 04/03 0700   P.O. 390   I.V. (mL/kg) 1005 (18.1)   Total Intake(mL/kg) 1395 (25.1)   Urine (mL/kg/hr) 575 (0.4)   Total Output 575   Net +820        - I/O DETAILED x 24h    Total I/O In: 1170 [P.O.:390; I.V.:780] Out: 575 [Urine:575] - I/O THIS SHIFT    ASSESSMENT Low k  eICURN Interventions  Iv kcl x 3   ASSESSMENT: MAJOR ELECTROLYTE      Dr. Kalman Shan, M.D., [facility].C.P Pulmonary and Critical Care Medicine Staff Physician [facility] Pager: [phone], If no answer or between  15:00h - 7:00h: call [phone]  10/17/2012 5:12 AM

## 2012-10-17 NOTE — Progress Notes (Addendum)
eLink Physician-Brief Progress Note Patient Name: Brandi Raymond DOB: 09/20/1933 MRN: [id number]  Date of Service  10/17/2012   HPI/Events of Note   Rt arm BP 154/67 and left arm 101/61. On Nipride 0.41mcg/kg/min  eICU Interventions  Check cxr port for mediastinal widerning. BP discrepancy could be related to dissection  CXR 3:03 AM shows aortic widening. Advised RN to titrate bp using the right side where the BP is higher and d/w Dr Hart Rochester of [facility] on call    Intervention Category Major Interventions: Other:  Aiyah Scarpelli 10/17/2012, 2:43 AM

## 2012-10-17 NOTE — Progress Notes (Addendum)
[facility] [place]  Post-op  Angiogram note  Date of Surgery: 10/15/2012  Surgeon(s): Nada Libman, MD  2 Days Post-Op Procedure(s): ABDOMINAL AORTAGRAM VISCERAL ANGIOGRAM  History of Present Illness  Brandi Raymond is a 79 y.o. female who is  up s/p  ABDOMINAL AORTAGRAM VISCERAL ANGIOGRAM. Pt is comfortable this am. States back pain less. Denies CP this am. CTA shows IMH in descending aortic arch SBP higher in right arm and is on nipride drip  Significant Diagnostic Studies: CBC Lab Results  Component Value Date   WBC 13.6* 10/17/2012   HGB 8.8* 10/17/2012   HCT 27.5* 10/17/2012   MCV 84.4 10/17/2012   PLT 100* 10/17/2012    BMET    Component Value Date/Time   NA 135 10/17/2012 0255   K 3.2* 10/17/2012 0255   CL 103 10/17/2012 0255   CO2 27 10/17/2012 0255   GLUCOSE 121* 10/17/2012 0255   BUN 9 10/17/2012 0255   CREATININE 0.59 10/17/2012 0255   CALCIUM 8.0* 10/17/2012 0255   GFRNONAA 86* 10/17/2012 0255   GFRAA >90 10/17/2012 0255    COAG Lab Results  Component Value Date   INR 0.9 01/07/2009   No results found for this basename: PTT    I/O last 3 completed shifts: In: 2833.1 [P.O.:510; I.V.:2323.1] Out: 1375 [Urine:1275; Emesis/NG output:100]    Physical Examination BP Readings from Last 3 Encounters:  10/17/12 130/46  10/17/12 130/46  10/08/12 89/58   Temp Readings from Last 3 Encounters:  10/16/12 98.2 F (36.8 C) Oral  10/16/12 98.2 F (36.8 C) Oral  08/07/12 98.1 F (36.7 C) Oral   SpO2 Readings from Last 3 Encounters:  10/17/12 100%  10/17/12 100%  09/10/12 95%   Pulse Readings from Last 3 Encounters:  10/17/12 60  10/17/12 60  10/08/12 59    General: A&O x 3, WDWN female in NAD Pulmonary: normal non-labored breathing ,  Cardiac: Heart rate : regular , SB on beta blocker ABD soft Right groin with some ecchymosis, soft, no hematoma Vascular Exam:BLE warm, feet cool and well perfused Palp DP pulses  bilat  Assessment/Plan: Brandi Raymond is a 79 y.o. female who is 2 Days Post-Op Procedure(s): ABDOMINAL AORTAGRAM VISCERAL ANGIOGRAM HTN with IMH of descending aortic arch - on Nipride for BP control Hypokalemia - will replace Acute blood loss anemia post procedure- may be dilutional Will recheck labs this afternoon Systolic BP discrepancies may be due to dissection vs Left SCA  Stenosis in light of pt severe Atherosclerotic disease Mesenteric ischemia- eating, no C/O Abd pain this am     Marlowe Shores [phone] 10/17/2012 7:43 AM  Doing much better today.  Pain significantly improved.  Will try to wean off nipride today  Wells Brabham

## 2012-10-17 NOTE — Progress Notes (Signed)
Potassium 3.2, Cr 0.59, UOP>20cc/hr.  po potassium given.

## 2012-10-17 NOTE — Progress Notes (Signed)
Dr. Hart Rochester made aware of night's events.  No new orders received.  Will continue to monitor on current plan of care.
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
## 2012-10-17 NOTE — Progress Notes (Signed)
Blood pressure cuff on patient's R arm reading 150-160 systolic over 60's diastolic.  Nipride gtt started at 0.68mcg/kg/min.  Will monitor BP q31min and titrate gtt accordingly to maintain systolic < 120 per MD order.  Patient also complaining of mid back pain, described as "aching".  50mg  po Ultram given.  Will continue to monitor.

## 2012-10-17 NOTE — Progress Notes (Signed)
Discrepancy of 40-50 points between systolic BP in L arm and R arm (right arm pressures > left arm pressures).  Patient describes aching pain in chest and back, rated at 5/10.  Stat PCXR obtained to assess for mediastinal widening (possible dissection).  Dr. Marchelle Gearing reviewed [facility] and stated that the mediastinum is "more full" than on previous x-ray from February.  Patient is not in distress.  VS:  HR 60 nsr, bp 149/27 (R arm), RR 21, sO2 97% on 2L nasal cannula.  CBC and BMET drawn, results pending.  Nipride gtt increased to 68mcg/kg/min.  Will obtain and treat blood pressure from patient's R arm.  Will continue to monitor.

## 2012-10-18 DIAGNOSIS — G458 Other transient cerebral ischemic attacks and related syndromes: Secondary | ICD-10-CM

## 2012-10-18 DIAGNOSIS — M549 Dorsalgia, unspecified: Secondary | ICD-10-CM

## 2012-10-18 LAB — CBC
HCT: 26.3 % — ABNORMAL LOW (ref 36.0–46.0)
Hemoglobin: 8.6 g/dL — ABNORMAL LOW (ref 12.0–15.0)
MCH: 27.5 pg (ref 26.0–34.0)
MCHC: 32.7 g/dL (ref 30.0–36.0)
MCV: 84 fL (ref 78.0–100.0)
Platelets: 84 10*3/uL — ABNORMAL LOW (ref 150–400)
RBC: 3.13 MIL/uL — ABNORMAL LOW (ref 3.87–5.11)
RDW: 14.7 % (ref 11.5–15.5)
WBC: 12.1 10*3/uL — ABNORMAL HIGH (ref 4.0–10.5)

## 2012-10-18 LAB — BASIC METABOLIC PANEL
BUN: 8 mg/dL (ref 6–23)
CO2: 29 mEq/L (ref 19–32)
Calcium: 7.8 mg/dL — ABNORMAL LOW (ref 8.4–10.5)
Chloride: 101 mEq/L (ref 96–112)
Creatinine, Ser: 0.51 mg/dL (ref 0.50–1.10)
GFR calc Af Amer: >90 mL/min (ref >90)
GFR calc non Af Amer: >90 mL/min (ref >90)
Glucose, Bld: 114 mg/dL — ABNORMAL HIGH (ref 70–99)
Potassium: 3.2 mEq/L — ABNORMAL LOW (ref 3.5–5.1)
Sodium: 135 mEq/L (ref 135–145)

## 2012-10-18 MED ORDER — POTASSIUM CHLORIDE CRYS ER 20 MEQ PO TBCR
20.0000 meq | EXTENDED_RELEASE_TABLET | Freq: Two times a day (BID) | ORAL | Status: AC
  Administered 2012-10-18 (×2): 20 meq via ORAL
  Filled 2012-10-18: qty 1

## 2012-10-18 MED ORDER — LISINOPRIL 2.5 MG PO TABS: 2.5000 mg | ORAL_TABLET | Freq: Every day | ORAL | Status: DC

## 2012-10-18 MED ORDER — LISINOPRIL 5 MG PO TABS
5.0000 mg | ORAL_TABLET | Freq: Every day | ORAL | Status: DC
  Administered 2012-10-19: 5 mg via ORAL
  Filled 2012-10-18: qty 1

## 2012-10-18 MED ORDER — TRAMADOL HCL 50 MG PO TABS: 50.0000 mg | ORAL_TABLET | ORAL | Status: DC | PRN

## 2012-10-18 MED ORDER — LISINOPRIL 2.5 MG PO TABS
2.5000 mg | ORAL_TABLET | Freq: Every day | ORAL | Status: AC
  Administered 2012-10-18: 2.5 mg via ORAL
  Filled 2012-10-18: qty 1

## 2012-10-18 NOTE — Progress Notes (Signed)
[facility]   Subjective  -   The patient is feeling much better today. She relates her chest and back pain as a 1/10. She had no acute issues overnight.   Physical Exam:  Cardiovascular: Regular rate and rhythm Pulmonary: Respirations are nonlabored she does have a productive cough. Pedal pulses are palpable Abdomen is soft       Assessment/Plan:  Intramural hematoma following catheterization  Hypertension: I started the patient back on a low-dose ACE inhibitor last night. I'm trying to get her nitride weaned to off. I have loosened her blood pressure parameters from 120-140. Hopefully she can get off of the nitride today and potentially be discharged home tonight versus tomorrow. I may need to increase her lisinopril.  Protein calorie malnutrition: Daily supplementation has been added to the patient's meal.  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 She has a robust inferior mesenteric artery with probable stenosis at its origin. She will benefit from mesenteric artery stenting, however this will have to be delayed given the intramural hematoma within her chest.  Disposition: I told the patient and her daughter that if her pain has resolved and she is off of IV blood pressure medications, she could go home this evening, at night she will likely be discharged tomorrow  Maclaine Ahola IV, V. WELLS 10/18/2012 10:34 AM --  Ceasar Mons Vitals:   10/18/12 0930  BP: 116/50  Pulse: 64  Temp:   Resp: 19    Intake/Output Summary (Last 24 hours) at 10/18/12 1034 Last data filed at 10/18/12 0900  Gross per 24 hour  Intake 1968.86 ml  Output   1925 ml  Net  43.86 ml     Laboratory CBC    Component Value Date/Time   WBC 12.1* 10/18/2012 0300   HGB 8.6* 10/18/2012 0300   HCT 26.3* 10/18/2012 0300   PLT 84* 10/18/2012 0300    BMET    Component Value Date/Time   NA 135 10/18/2012 0300   K  3.2* 10/18/2012 0300   CL 101 10/18/2012 0300   CO2 29 10/18/2012 0300   GLUCOSE 114* 10/18/2012 0300   BUN 8 10/18/2012 0300   CREATININE 0.51 10/18/2012 0300   CALCIUM 7.8* 10/18/2012 0300   GFRNONAA >90 10/18/2012 0300   GFRAA >90 10/18/2012 0300    COAG Lab Results  Component Value Date   INR 0.9 01/07/2009   No results found for this basename: PTT    Antibiotics Anti-infectives   None       V. Charlena Cross, M.D. [facility] [employer] Office: [phone] Pager:  [phone]

## 2012-10-19 LAB — BASIC METABOLIC PANEL
BUN: 7 mg/dL (ref 6–23)
CO2: 30 mEq/L (ref 19–32)
Calcium: 7.7 mg/dL — ABNORMAL LOW (ref 8.4–10.5)
Chloride: 100 mEq/L (ref 96–112)
Creatinine, Ser: 0.48 mg/dL — ABNORMAL LOW (ref 0.50–1.10)
GFR calc Af Amer: >90 mL/min (ref >90)
GFR calc non Af Amer: >90 mL/min (ref >90)
Glucose, Bld: 110 mg/dL — ABNORMAL HIGH (ref 70–99)
Potassium: 3.2 mEq/L — ABNORMAL LOW (ref 3.5–5.1)
Sodium: 136 mEq/L (ref 135–145)

## 2012-10-19 MED ORDER — LISINOPRIL 5 MG PO TABS: 5.0000 mg | ORAL_TABLET | Freq: Two times a day (BID) | ORAL | Status: DC

## 2012-10-19 MED ORDER — LISINOPRIL 5 MG PO TABS: 5.0000 mg | ORAL_TABLET | Freq: Every day | ORAL | Status: DC

## 2012-10-19 NOTE — Progress Notes (Addendum)
[facility] Progress Note  10/19/2012 8:27 AM 4 Days Post-Op   Subjective:  No complaints; states that she still has back pain that is down the center of her back that comes and goes.  afebrile 80's-150's systolic HR 60-80's regular 96% 2LO2NC  Filed Vitals:   10/19/12 0736  BP:   Pulse:   Temp: 98.5 F (36.9 C)  Resp:     CBC    Component Value Date/Time   WBC 12.1* 10/18/2012 0300   RBC 3.13* 10/18/2012 0300   HGB 8.6* 10/18/2012 0300   HCT 26.3* 10/18/2012 0300   PLT 84* 10/18/2012 0300   MCV 84.0 10/18/2012 0300   MCH 27.5 10/18/2012 0300   MCHC 32.7 10/18/2012 0300   RDW 14.7 10/18/2012 0300   LYMPHSABS 2.0 08/07/2012 1441   MONOABS 0.6 08/07/2012 1441   EOSABS 0.1 08/07/2012 1441   BASOSABS 0.0 08/07/2012 1441    BMET    Component Value Date/Time   NA 136 10/19/2012 0444   K 3.2* 10/19/2012 0444   CL 100 10/19/2012 0444   CO2 30 10/19/2012 0444   GLUCOSE 110* 10/19/2012 0444   BUN 7 10/19/2012 0444   CREATININE 0.48* 10/19/2012 0444   CALCIUM 7.7* 10/19/2012 0444   GFRNONAA >90 10/19/2012 0444   GFRAA >90 10/19/2012 0444    INR    Component Value Date/Time   INR 0.9 01/07/2009 1414     Intake/Output Summary (Last 24 hours) at 10/19/12 0827 Last data filed at 10/19/12 0700  Gross per 24 hour  Intake 1617.8 ml  Output   2650 ml  Net -1032.2 ml     Assessment/Plan:  79 y.o. female is s/p:  1. ultrasound-guided access, right femoral artery  2. abdominal aortogram  3. selective first order catheterization, celiac artery  4. celiac artery angiogram  5. selective first-order catheterization, superior mesenteric artery  6. superior mesenteric artery angiogram   4 Days Post-Op   -pt is continuing to do well, but she is still requiring Nipride gtt -lisinopril was increased yesterday to 5mg  daily-may need to increase this more. -continue to wean nipride as tolerated -Continue to monitor today-possibly home later today if BP stays stable.  Doreatha Massed,  PA-C [facility] [phone] 10/19/2012 8:27 AM    Stable overall.  Will dc nipride and dc home if bp stable.  Discussed with pt and family present

## 2012-10-19 NOTE — Progress Notes (Signed)
Dr. Arbie Cookey notified of pt's blood pressure off Nipride for two hours. Orders given to discharge patient. Discharge instructions given to patient along with prescriptions. Patient will follow up with Dr. Myra Gianotti as instructed. Patient discharged per wheelchair with daughter. Peripheral IV's Dc'd.

## 2012-10-21 ENCOUNTER — Encounter: Payer: Self-pay | Admitting: Vascular Surgery

## 2012-10-21 LAB — TYPE AND SCREEN
ABO/RH(D): A NEG
Antibody Screen: NEGATIVE
Unit division: 0
Unit division: 0

## 2012-10-22 ENCOUNTER — Encounter (HOSPITAL_COMMUNITY): Admission: RE | Payer: Self-pay | Source: Ambulatory Visit

## 2012-10-22 ENCOUNTER — Telehealth: Payer: Self-pay

## 2012-10-22 ENCOUNTER — Other Ambulatory Visit: Payer: Self-pay | Admitting: *Deleted

## 2012-10-22 ENCOUNTER — Encounter: Payer: Self-pay | Admitting: Internal Medicine

## 2012-10-22 ENCOUNTER — Other Ambulatory Visit: Payer: Self-pay | Admitting: Internal Medicine

## 2012-10-22 ENCOUNTER — Ambulatory Visit (INDEPENDENT_AMBULATORY_CARE_PROVIDER_SITE_OTHER): Payer: Medicare Other | Admitting: Internal Medicine

## 2012-10-22 ENCOUNTER — Ambulatory Visit (INDEPENDENT_AMBULATORY_CARE_PROVIDER_SITE_OTHER)
Admission: RE | Admit: 2012-10-22 | Discharge: 2012-10-22 | Disposition: A | Discharge status: Home / Self Care | Payer: Medicare Other | Source: Ambulatory Visit | Attending: Internal Medicine | Admitting: Internal Medicine

## 2012-10-22 ENCOUNTER — Ambulatory Visit
Admission: RE | Admit: 2012-10-22 | Discharge: 2012-10-22 | Disposition: A | Discharge status: Home / Self Care | Payer: Medicare Other | Source: Ambulatory Visit | Attending: Surgery | Admitting: Surgery

## 2012-10-22 ENCOUNTER — Ambulatory Visit (HOSPITAL_COMMUNITY): Admission: RE | Admit: 2012-10-22 | Payer: Medicare Other | Source: Ambulatory Visit | Admitting: Surgery

## 2012-10-22 VITALS — BP 144/72 | HR 55 | Temp 97.0°F | Ht 64.5 in | Wt 121.2 lb

## 2012-10-22 DIAGNOSIS — J189 Pneumonia, unspecified organism: Secondary | ICD-10-CM

## 2012-10-22 DIAGNOSIS — R05 Cough: Secondary | ICD-10-CM

## 2012-10-22 DIAGNOSIS — Z48812 Encounter for surgical aftercare following surgery on the circulatory system: Secondary | ICD-10-CM

## 2012-10-22 DIAGNOSIS — R059 Cough, unspecified: Secondary | ICD-10-CM

## 2012-10-22 DIAGNOSIS — I71012 Dissection of descending thoracic aorta: Secondary | ICD-10-CM

## 2012-10-22 DIAGNOSIS — I10 Essential (primary) hypertension: Secondary | ICD-10-CM

## 2012-10-22 DIAGNOSIS — I7101 Dissection of thoracic aorta: Secondary | ICD-10-CM

## 2012-10-22 DIAGNOSIS — I714 Abdominal aortic aneurysm, without rupture, unspecified: Secondary | ICD-10-CM

## 2012-10-22 SURGERY — VISCERAL ANGIOGRAM
Anesthesia: LOCAL

## 2012-10-22 MED ORDER — IOHEXOL 350 MG/ML SOLN
100.0000 mL | Freq: Once | INTRAVENOUS | Status: AC | PRN
  Administered 2012-10-22: 100 mL via INTRAVENOUS

## 2012-10-22 MED ORDER — LISINOPRIL 10 MG PO TABS: 10.0000 mg | ORAL_TABLET | Freq: Every day | ORAL | Status: DC

## 2012-10-22 MED ORDER — LEVOFLOXACIN 250 MG PO TABS: 250.0000 mg | ORAL_TABLET | Freq: Every day | ORAL | Status: DC

## 2012-10-22 MED ORDER — HYDROCODONE-HOMATROPINE 5-1.5 MG/5ML PO SYRP: 5.0000 mL | ORAL_SOLUTION | Freq: Three times a day (TID) | ORAL | Status: DC | PRN

## 2012-10-22 NOTE — Telephone Encounter (Signed)
Left voice message for daughter, that Dr. Hart Rochester out of office/ not available until 10/24/12.  Daughter called back and expressed concern of ongoing symptoms; reports BP still 160-170's, systolically, c/o constant pain in area beneath (R) shoulder blade, and, now, states has new pain down in (R) lower back and hip area.  States now "coughing-up yellow-green phlegm and sounds wheezy".  Denies pt. has fever, but states says "she is hot all the time."  Denies abdominal pain, but says pt. will intermittently hold her stomach and c/o not feeling good.  Denies pt. has nausea or vomiting, but says pt. c/o difficulty swallowing.  Requesting to speak to Dr. Myra Gianotti instead of Dr. Hart Rochester.  States since Dr. Myra Gianotti did her procedure, she prefers to discuss the above symptoms with him.  Advised will call  Dr. Myra Gianotti and report the above symptoms.

## 2012-10-22 NOTE — Telephone Encounter (Signed)
Called Dr. Myra Gianotti.  Informed of reported symptoms.  Recommends pt. To see her PCP, Dr. Alwyn Ren, today, to evaluate her BP, and to schedule CTA chest to evaluate for aortic dissection.  Dr. Myra Gianotti will call daughter later today to discuss pt's status.  Will notify daughter of the above.

## 2012-10-22 NOTE — Patient Instructions (Signed)

## 2012-10-22 NOTE — Telephone Encounter (Signed)
Okey Regal from Vascular called b/c she spoke with Olegario Messier (patient's daughter) and Olegario Messier indicated B/P elevated and patient may need her B/P medications adjusted. Patient would like to be seen today.   I discussed with Okey Regal that we do not have any openings (Dr.Hopper), all the other providers that have openings are around 3 pm and patient already with pending appointment at 3:45 for CT. I will scheduled patient for an earlier appointment at another facility if she would be willing to be seen at another facility.  I contacted Olegario Messier @ [phone] to discuss scheduling appointment at 1:15 pm with another MD

## 2012-10-22 NOTE — Telephone Encounter (Signed)
Brandi Raymond called me back and indicated she will have her mother see MD at [facility] at 1:15 pm for elevated blood pressure.
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
## 2012-10-22 NOTE — Assessment & Plan Note (Signed)
Not well controlled on current therapy Increase Lisinopril to 10 mg BID Goal BP < 120/80 given aneurysm

## 2012-10-22 NOTE — Progress Notes (Signed)
Wasted 75 mcgs of fentanyl witnessed by [employer] rn

## 2012-10-23 ENCOUNTER — Encounter: Payer: Self-pay | Admitting: Internal Medicine

## 2012-10-24 ENCOUNTER — Telehealth: Payer: Self-pay | Admitting: *Deleted

## 2012-10-24 NOTE — Telephone Encounter (Signed)
VM received wanting to know status of mychart message. Spoke with Chrae who advised she received message and is in the process of working on this for the Pt and will call her with info once she has completed it. Called and advised Pt daughter of the following.

## 2012-10-25 NOTE — Discharge Summary (Signed)
[facility] Discharge Summary  Brandi Raymond Aug 12, 1933 79 y.o. female  [id number]  Admission Date: 10/15/2012  Discharge Date: 10/19/12  Physician: No att. providers found  Admission Diagnosis: PVD   HPI:   This is a 79 y.o. female was referred by Dr. Sheryn Bison for possible mesenteric ischemia. This patient has been having nonspecific abdominal discomfort for the past several months and according to the daughter she has lost 20 pounds in the last 6 months. She has postprandial pain but also has fairly chronic pain. She has bloating and loose bowel movements. No blood per rectum. She has cholelithiasis which is thought to be asymptomatic. She recently had a CT angiogram of the abdomen which I have reviewed and discussed with Dr. Jarold Motto. 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 Her abdominal aorta is very heavily calcified but it does not appear that there is calcific plaque in the origin of the SMA and celiac axis.  Hospital Course:  The patient was admitted to the hospital and taken to the [facility] lab on 10/15/2012 and underwent: 1. ultrasound-guided access, right femoral artery  2. abdominal aortogram  3. selective first order catheterization, celiac artery  4. celiac artery angiogram  5. selective first-order catheterization, superior mesenteric artery  6. superior mesenteric artery angiogram    The pt tolerated the procedure well and was transported to the PACU in good condition.   Post cath, Pt having significant back pain following cath. This has not been well controlled with narcotics. Her CBC, BMET and cardiac nezymes were unremarkable as was her EKG. Legs are well perfused and her right groin is soft. I suspect she is having back pain from lying flat on the table for her procedure. She is also vomiting and having episodes of asymptomatic bradycardia which I suspect is vagal. My plan is to hydrate her and treat her  symptomatically. If this persists she will need a CTA to r/o dissection,etc. With already having received contrast today for her cath, I would like to hold of with this for now. Family updated at bedside.  Pt continued to have pain and underwent CTA, which revealed a dissection in the descending aorta.  She was then transferred back to the ICU for strict HTN control with Nipride gtt.  Lovenox and SCD's were ordered as well as bedrest.  The next day, The patient wanted to go home this morning, however since she still had back pain, I was uncomfortable with this without getting a CT scan to rule out a dissection or other serious pathology As the source of her pain. I have reviewed her CT scan and it is most consistent with an intramural hematoma of her descending thoracic aorta. I had an extensive conversation with the patient and her family at the bedside explaining the findings of the CT scan. At this point, I feel this should be managed as we would a spontaneous IMH.with blood pressure control of SBP< 120, using oral and IV meds. She will need transfer to the ICU for careful monitoring. Fortunately, her pain this afternoon is much improved and she has been able to keep food down. I told her that as a result of the IMH, I would not recommend another attempt at SMA stenting for at least 1 month. Prior to that, she will need a repeat CT scan to make sure there are no additional complicating factors. All present were in agreement.  On POD 3, she was  started the patient back on a low-dose ACE inhibitor last night. I'm trying to get her nitride weaned to off. I have loosened her blood pressure parameters from 120-140. Hopefully she can get off of the nitride today and potentially be discharged home tonight versus tomorrow. I may need to increase her lisinopril.  Protein calorie malnutrition: Daily supplementation has been added to the patient's meal.  Possible mesenteric ischemia: The patient has severe stenosis  within her celiac and superior mesenteric artery. She has a robust inferior mesenteric artery with probable stenosis at its origin. She will benefit from mesenteric artery stenting, however this will have to be delayed given the intramural hematoma within her chest.  Disposition: I told the patient and her daughter that if her pain has resolved and she is off of IV blood pressure medications, she could go home this evening, at night she will likely be discharged tomorrow.  Her Nipride was weaned and her ACEI was increased to 5mg  bid.  She was discharged home on 10/19/12.  She will f/u with Dr. Myra Gianotti in 4 weeks with a CTA.  The remainder of the hospital course consisted of increasing mobilization and increasing intake of solids without difficulty.  CBC    Component Value Date/Time   WBC 12.1* 10/18/2012 0300   RBC 3.13* 10/18/2012 0300   HGB 8.6* 10/18/2012 0300   HCT 26.3* 10/18/2012 0300   PLT 84* 10/18/2012 0300   MCV 84.0 10/18/2012 0300   MCH 27.5 10/18/2012 0300   MCHC 32.7 10/18/2012 0300   RDW 14.7 10/18/2012 0300   LYMPHSABS 2.0 08/07/2012 1441   MONOABS 0.6 08/07/2012 1441   EOSABS 0.1 08/07/2012 1441   BASOSABS 0.0 08/07/2012 1441    BMET    Component Value Date/Time   NA 136 10/19/2012 0444   K 3.2* 10/19/2012 0444   CL 100 10/19/2012 0444   CO2 30 10/19/2012 0444   GLUCOSE 110* 10/19/2012 0444   BUN 7 10/19/2012 0444   CREATININE 0.48* 10/19/2012 0444   CALCIUM 7.7* 10/19/2012 0444   GFRNONAA >90 10/19/2012 0444   GFRAA >90 10/19/2012 0444     Discharge Instructions:   The patient is discharged to home with extensive instructions on wound care and progressive ambulation.  They are instructed not to drive or perform any heavy lifting until returning to see the physician in his office.   Future Appointments Provider Department Dept Phone   11/18/2012 8:45 AM Nada Libman, MD [facility] [phone]   12/02/2012 11:00 AM York Spaniel, MD [facility] [phone]      Discharge Diagnosis:  PVD  Secondary Diagnosis: Patient Active Problem List  Diagnosis  . COLONIC POLYPS  . UNSPECIFIED HYPOTHYROIDISM  . DIABETES MELLITUS, TYPE II, CONTROLLED  . VITAMIN D DEFICIENCY  . HYPERLIPIDEMIA  . ANEMIA-IRON DEFICIENCY  . CIGARETTE SMOKER  . EXOPHTHALMOS  . HYPERTENSION, ESSENTIAL NOS  . C A D  . CAROTID ARTERY STENOSIS  . OTHER EMPHYSEMA  . DIVERTICULOSIS, COLON  . CHOLELITHIASIS  . OSTEOPENIA  . ABDOMINAL BRUIT  . UNSPECIFIED ANEMIA  . Labial cyst  . Syncope  . Memory deficit  . Irritable bowel syndrome  . Ocular myasthenia gravis  . PVD (peripheral vascular disease)  . Chronic mesenteric ischemia  . Abdominal pain, unspecified site   Past Medical History  Diagnosis Date  . COPD (chronic obstructive pulmonary disease)   . Hypothyroidism   . CAD (coronary artery disease)  Dr Antoine Poche  . HTN (hypertension)   . Iron deficiency anemia   . Myocardial infarction 1993  . Ocular myasthenia gravis     Dr Anne Hahn  . Syncope 1998  . Diverticulosis   . GERD (gastroesophageal reflux disease) 09/15/1991    Dr Jarold Motto  . Hyperplastic polyps of stomach 11/2007    colonoscopy  . HLD (hyperlipidemia)   . Cervical spine fracture   . Hiatal hernia 09/15/1991  . Gastritis 09/15/1991  . Adenomatous colon polyp      Medication List    TAKE these medications       aspirin 81 MG tablet  Take 81 mg by mouth daily.     CALCIUM-VITAMIN D PO  Take 1 tablet by mouth 2 (two) times daily.     clonazePAM 1 MG tablet  Commonly known as:  KLONOPIN  Take 0.5-1 mg by mouth at bedtime as needed (for sleep).     donepezil 10 MG tablet  Commonly known as:  ARICEPT  Take 10 mg by mouth at bedtime.     levothyroxine 112 MCG tablet  Commonly known as:  SYNTHROID, LEVOTHROID  Take 56-112 mcg by mouth daily. 1 by mouth daily EXCEPT 1/2 on T/TH     metoprolol tartrate 25 MG tablet  Commonly known as:  LOPRESSOR  Take 25 mg by  mouth 2 (two) times daily.     multivitamin tablet  Take 1 tablet by mouth daily.     NEURONTIN 300 MG capsule  Generic drug:  gabapentin  Take 300-600 mg by mouth 3 (three) times daily. Take 1 capsule in the morning, 1 capsule at noon, and 2 capsules at bedtime.  (RX'ed by Dr.Willis)     nitroGLYCERIN 0.3 MG SL tablet  Commonly known as:  NITROSTAT  Place 0.3 mg under the tongue every 5 (five) minutes as needed.     Pancrelipase (Lip-Prot-Amyl) 36000 UNITS Cpep  Take 2 capsules by mouth 3 (three) times daily with meals.     pravastatin 40 MG tablet  Commonly known as:  PRAVACHOL  Take 40 mg by mouth daily.     sertraline 50 MG tablet  Commonly known as:  ZOLOFT  Take 1 tablet (50 mg total) by mouth daily.     traMADol 50 MG tablet  Commonly known as:  ULTRAM  Take 1 tablet (50 mg total) by mouth every 4 (four) hours as needed.         A Rx was sent to her pharmacy for lisinopril 5 mg bid. tramadol #30 No Refill  Disposition: home  Patient's condition: is Good  Follow up: 1. Dr. Myra Gianotti in 4 weeks with CTA   Doreatha Massed, PA-C [facility] [phone] 10/25/2012  2:04 PM

## 2012-10-28 ENCOUNTER — Telehealth: Payer: Self-pay | Admitting: Internal Medicine

## 2012-10-28 ENCOUNTER — Telehealth: Payer: Self-pay | Admitting: *Deleted

## 2012-10-28 DIAGNOSIS — I10 Essential (primary) hypertension: Secondary | ICD-10-CM

## 2012-10-28 MED ORDER — LISINOPRIL 10 MG PO TABS: 10.0000 mg | ORAL_TABLET | Freq: Two times a day (BID) | ORAL | Status: DC

## 2012-10-28 NOTE — Telephone Encounter (Signed)
VM left on Friday stating that call was received on yesterday indicated that request was received and was being processed however  she has yet to hear anything about home health assessment today. Per daughter she would like this completed ASAP because she is currently trying to work and take care of her mom which is a little difficulty. Pt daughter would like to know if there is something or someone she can call to get this process started.

## 2012-10-28 NOTE — Telephone Encounter (Signed)
I have contacted [facility] @ [phone], left message on voicemail. I am waiting to hear back if they accept patient's insurance and if yes we will have then get involved in patient's care and assess needs

## 2012-10-28 NOTE — Telephone Encounter (Signed)
[facility] contacted

## 2012-10-28 NOTE — Telephone Encounter (Signed)
[facility] returned call indicating patient's insurance is not under contract with them.  I will send information to [facility]

## 2012-10-28 NOTE — Telephone Encounter (Signed)
[facility] Triage Call Report Triage Record Num: [id number] Operator: Jari Sportsman Patient Name: Brandi Raymond Call Date & Time: 10/25/2012 9:05:46PM Patient Phone: [phone] PCP: Patient Gender: Female PCP Fax : Patient DOB: 18-Apr-1934 Practice Name: [facility] Reason for Call: Caller: Kathy/Other; PCP: Nicki Reaper; CB#: [phone]; Call regarding ; April 1 angioplasty of gut, dissection of artery, sent home on the 5th. Seen by Alfonso Ellis NP Tuesday 10/22/12, was told will change Lisinopril dose double. Was on Lisinopril 5mg  po BID daily, changed to 10mg  daily (on new prescription bottle). Was changed due to could not get BP under control. Was told dosage would be doubled. Was told to finish the 5mg  first and now starting on 10mg . Contacted Lowne DO and she advised to take Lisinopril 10mg  twice a day and call office on Monday and have it changed. Per epic chart HYPERTENSION, ESSENTIAL NOS - Nicki Reaper, NP at 10/22/2012 1:49 PM Status: Written Related Problem: HYPERTENSION, ESSENTIAL NOS Not well controlled on current therapy Increase Lisinopril to 10 mg BID Goal BP < 120/80. Given aneurysm lisinopril (PRINIVIL,ZESTRIL) 10 MG tablet 90 tablet 0 10/22/2012 Take 1 tablet (10 mg total) by mouth daily. - Oral Caller to contact office on Monday to have it changed. Protocol(s) Used: Office Note Recommended Outcome per Protocol: Information Noted and Sent to Office Reason for Outcome: Caller information to office

## 2012-10-28 NOTE — Telephone Encounter (Signed)
Increased dose appropriate. Lisinopril 10 mg one twice a day; dispense 60 refill x3

## 2012-10-28 NOTE — Telephone Encounter (Signed)
Rx sent 

## 2012-10-29 NOTE — Discharge Summary (Signed)
Agree with the above  Brandi Raymond 

## 2012-10-30 ENCOUNTER — Other Ambulatory Visit: Payer: Self-pay | Admitting: Internal Medicine

## 2012-10-31 ENCOUNTER — Telehealth: Payer: Self-pay | Admitting: *Deleted

## 2012-10-31 NOTE — Telephone Encounter (Signed)
I spoke with patient, patient marked as high risk and per protocol patient to have UDS when controlled substance requested. Patient informed of this and indicates she is unable to pick this up to because of her medical conditions that limit her.  Per Dr.Hopper patient with diagnosis of aneurysm and we can over-ride UDS, as long as medication is not being abused ok to fill today and in the future

## 2012-10-31 NOTE — Telephone Encounter (Signed)
Spoke with patient explained that she needed to come to the office to pick up Rx and provide urine sample at that time. Patient states that she will have her daughter bring her here in the next few days to pick up script.

## 2012-10-31 NOTE — Telephone Encounter (Signed)
I spoke with Brandi Raymond, I informed Brandi Raymond that I tried [facility] first and was told insurance not accepted, 2nd attempt was [facility]-same situation, unable to accept insurance at the time, I then faxed information to [facility], I have not heard anything back from [facility], they will usually contact patient. The patient's daughter requested number to contact [facility], Number given

## 2012-10-31 NOTE — Telephone Encounter (Signed)
Pt daughter would like for you to give her a call to discuss the request for the home health nurse. Olegario Messier states that she still has not seen or heard from anyone and would like to know if we could go ahead and push this through..Please advise

## 2012-11-01 NOTE — Telephone Encounter (Signed)
Patient's daughter called 11/01/12 & stated [facility] does not have the fax & cannot work with patient until it is received. Please refax [phone].

## 2012-11-02 ENCOUNTER — Other Ambulatory Visit: Payer: Self-pay | Admitting: Internal Medicine

## 2012-11-04 ENCOUNTER — Ambulatory Visit (INDEPENDENT_AMBULATORY_CARE_PROVIDER_SITE_OTHER)
Admission: RE | Admit: 2012-11-04 | Discharge: 2012-11-04 | Disposition: A | Discharge status: Home / Self Care | Payer: Medicare Other | Source: Ambulatory Visit | Attending: Internal Medicine | Admitting: Internal Medicine

## 2012-11-04 ENCOUNTER — Ambulatory Visit (INDEPENDENT_AMBULATORY_CARE_PROVIDER_SITE_OTHER): Payer: Medicare Other | Admitting: Internal Medicine

## 2012-11-04 ENCOUNTER — Encounter: Payer: Self-pay | Admitting: Internal Medicine

## 2012-11-04 VITALS — BP 124/70 | HR 67 | Temp 97.8°F | Wt 116.0 lb

## 2012-11-04 DIAGNOSIS — J90 Pleural effusion, not elsewhere classified: Secondary | ICD-10-CM

## 2012-11-04 DIAGNOSIS — R05 Cough: Secondary | ICD-10-CM

## 2012-11-04 DIAGNOSIS — I10 Essential (primary) hypertension: Secondary | ICD-10-CM

## 2012-11-04 DIAGNOSIS — I712 Thoracic aortic aneurysm, without rupture, unspecified: Secondary | ICD-10-CM

## 2012-11-04 DIAGNOSIS — R059 Cough, unspecified: Secondary | ICD-10-CM

## 2012-11-04 MED ORDER — CLONAZEPAM 1 MG PO TABS: ORAL_TABLET | ORAL | Status: DC

## 2012-11-04 MED ORDER — HYDROCODONE-HOMATROPINE 5-1.5 MG/5ML PO SYRP: ORAL_SOLUTION | ORAL | Status: DC

## 2012-11-04 NOTE — Patient Instructions (Addendum)
Please review the medication list in the After Visit Summary provided.Please verify the medication name (this may be  brand or generic) & correct dosage. Write the name of the prescribing physician to the right of the medication and share this with all medical staff seen at each appointment. This will help provide continuity of care; help optimize therapeutic interventions;and help prevent drug:drug adverse reaction. Minimal Blood Pressure Goal= AVERAGE < 140/90;  Ideal is an AVERAGE < 135/85. This AVERAGE should be calculated from @ least 5-7 BP readings taken @ different times of day on different days of week. You should not respond to isolated BP readings , but rather the AVERAGE for that week .Please bring your  blood pressure cuff to office visits to verify that it is reliable.It  can also be checked against the blood pressure device at the pharmacy. Finger or wrist cuffs are not dependable; an arm cuff is.  If the cough persists; the lisinopril should be changed to an angiotensin receptor blocker type antihypertensive medication.  Please consider health care power of attorney and living will. I do not recommend chest compression intervention for the reasons I mentioned.  Originally she was marked as high risk in relationship to controlled substances; this was based on her age and polypharmacy. She should be reclassified to low risk based on our discussions and her daughter's monitor.

## 2012-11-04 NOTE — Assessment & Plan Note (Signed)
Blood pressure adequately controlled; goals discussed

## 2012-11-04 NOTE — Telephone Encounter (Signed)
Rx faxed to [facility].  

## 2012-11-04 NOTE — Telephone Encounter (Signed)
Information was re-submitted to [facility] @ [phone]

## 2012-11-04 NOTE — Progress Notes (Signed)
  Subjective:    Patient ID: Brandi Raymond, female    DOB: [birth year], 79 y.o.   MRN: [id number]  HPI She was seen 10/22/12 with poorly controlled blood pressure; blood pressures have been as high as  the 190s/97. Lisinopril was increased to 10 mg twice a day with significant response. Blood pressures now range 117-134/62-79. There was one isolated blood pressure of 166/79.    Review of Systems Chest x-ray 10/22/12 revealed left pleural effusion; the films were reviewed with the patient and her daughter. She is having left interscapular pain with position change such as rolling over in bed and burping. She continues to have a cough for which she takes Hycodan cough syrup. The cough is no worse with increase in lisinopril. She describes the cough as intermittent . She denies fever, chills, sweats, hemoptysis, or purulent sputum at this time.     Objective:   Physical Exam General appearance:thin but adequately nourished; no acute distress or increased work of breathing is present.  No  lymphadenopathy about the head, neck, or axilla noted.   Eyes: No conjunctival inflammation or lid edema is present. There is no scleral icterus.  Ears:  External ear exam shows no significant lesions or deformities.  Otoscopic examination reveals clear canals, tympanic membranes are intact bilaterally without bulging, retraction, inflammation or discharge.  Nose:  External nasal examination shows no deformity or inflammation. Nasal mucosa are pink and moist without lesions or exudates. No septal dislocation or deviation.No obstruction to airflow.   Oral exam: Dentures; lips and gums are healthy appearing.There is no oropharyngeal erythema or exudate noted.   Neck:  No deformities,  masses, or tenderness noted.     Heart:  Normal rate and regular rhythm. S1 and S2 normal without gallop, murmur, click, rub or other extra sounds.   Lungs:Chest clear to auscultation; but decreased breath sounds. No wheezes,  rhonchi,rales ,or rubs present.No increased work of breathing.    Extremities:  No cyanosis, edema, or clubbing  noted    Skin: Warm & dry          Assessment & Plan:

## 2012-11-07 ENCOUNTER — Telehealth: Payer: Self-pay | Admitting: Internal Medicine

## 2012-11-07 NOTE — Telephone Encounter (Signed)
Error. BC °

## 2012-11-08 ENCOUNTER — Telehealth: Payer: Self-pay | Admitting: *Deleted

## 2012-11-08 NOTE — Telephone Encounter (Signed)
Received call from Tiffany [phone]) at [facility] requesting order clarification for this patient. She needs to know what patient is needing from Home Health support, ie PT, OT, RN etc. I advised that Dr, Alwyn Ren and Chrae out of the office until Monday and call would be returned at that time.

## 2012-11-11 ENCOUNTER — Telehealth: Payer: Self-pay | Admitting: Internal Medicine

## 2012-11-11 NOTE — Telephone Encounter (Signed)
Left message to call office

## 2012-11-11 NOTE — Telephone Encounter (Signed)
[facility] Triage Call Report Triage Record Num: [id number] Operator: Candida Peeling Patient Name: Brandi Raymond Call Date & Time: 11/09/2012 7:10:10PM Patient Phone: [phone] PCP: Marga Melnick Patient Gender: Female PCP Fax : [phone] Patient DOB: 12-31-1933 Practice Name: [facility] Reason for Call: Caller: Cathy/Other; PCP: Marga Melnick; CB#: [phone]; Call regarding Possible seizure, fell in floor; Caller is daughter Onset 11/09/12 after supper, pt began having tremors all over and fell into floor. Did not loose consciousness, but did not respond when name called. When she did respond stated she was dizzy. Caller states pt is on her way home and caller is going over there now. RN advised once she is w/ her mother to call back for assessment if needed. Protocol(s) Used: Office Note Recommended Outcome per Protocol: Information Noted and Sent to Office Reason for Outcome: Caller information to office Care Advice: ~ 04/

## 2012-11-11 NOTE — Telephone Encounter (Signed)
OV follow up recommended

## 2012-11-11 NOTE — Telephone Encounter (Signed)
Will forward to Dr.Hopper as a Lorain Childes

## 2012-11-11 NOTE — Telephone Encounter (Signed)
Dr.Hopper please advise on medication change request based on symptoms listed in CAN message, spoke with patient's daughter- patient with no apparent busing, patient became dizzy and lost balance on Saturday, patient with recorded low B/P readings

## 2012-11-11 NOTE — Telephone Encounter (Signed)
Patient Information:  Caller Name: Brandi Raymond  Phone: [phone]  Patient: Brandi Raymond, Brandi Raymond  Gender: Female  DOB: 1933/09/26  Age: 79 Years  PCP: Marga Melnick  Office Follow Up:  Does the office need to follow up with this patient?: Yes  Instructions For The Office: office please follow up with caller  RN Note:  caller was wanting a message to be sent back to Dr Alwyn Ren to see if medication should be changed (or at least night time Lisinopril changed).  Caller states she would bring pt in if Dr Alwyn Ren felt it was necessary  Symptoms  Reason For Call & Symptoms: caller reports pt had a "fainting spell" on Saturday.  Pt did hit the floor.  No LOC.Pt had been dizzy feeling before hand.  Caller reports pt had low blood pressue on Sunday (89/62, 110/63) This am her BP was 121/66.  Caller reports she held the patients Lisinopril last night  Reviewed Health History In EMR: Yes  Reviewed Medications In EMR: Yes  Reviewed Allergies In EMR: Yes  Reviewed Surgeries / Procedures: Yes  Date of Onset of Symptoms: 11/09/2012  Guideline(s) Used:  Fainting  Disposition Per Guideline:   Go to ED Now (or to Office with PCP Approval)  Reason For Disposition Reached:   Age > 50 years  Advice Given:  N/A  Patient Refused Recommendation:  Patient Refused Care Advice  caller wants Dr Alwyn Ren made aware and for him to decide if medication should be changed or if pt needs to be seen.

## 2012-11-11 NOTE — Telephone Encounter (Signed)
PT & OT ; adult failure to thrive. Please assess for any Nursing / Aide needs based on your home evaluation

## 2012-11-11 NOTE — Telephone Encounter (Signed)
Hopp please clarify PT, OT, RN ect  . . . . Marland Kitchen

## 2012-11-12 ENCOUNTER — Emergency Department (HOSPITAL_COMMUNITY): Payer: Medicare Other

## 2012-11-12 ENCOUNTER — Emergency Department (HOSPITAL_COMMUNITY)
Admission: EM | Admit: 2012-11-12 | Discharge: 2012-11-13 | Disposition: A | Discharge status: Home / Self Care | Payer: Medicare Other | Attending: Emergency Medicine | Admitting: Emergency Medicine

## 2012-11-12 ENCOUNTER — Encounter (HOSPITAL_COMMUNITY): Payer: Self-pay | Admitting: *Deleted

## 2012-11-12 DIAGNOSIS — Z23 Encounter for immunization: Secondary | ICD-10-CM | POA: Insufficient documentation

## 2012-11-12 DIAGNOSIS — J449 Chronic obstructive pulmonary disease, unspecified: Secondary | ICD-10-CM | POA: Insufficient documentation

## 2012-11-12 DIAGNOSIS — Y929 Unspecified place or not applicable: Secondary | ICD-10-CM | POA: Insufficient documentation

## 2012-11-12 DIAGNOSIS — Z8781 Personal history of (healed) traumatic fracture: Secondary | ICD-10-CM | POA: Insufficient documentation

## 2012-11-12 DIAGNOSIS — I252 Old myocardial infarction: Secondary | ICD-10-CM | POA: Insufficient documentation

## 2012-11-12 DIAGNOSIS — Z7982 Long term (current) use of aspirin: Secondary | ICD-10-CM | POA: Insufficient documentation

## 2012-11-12 DIAGNOSIS — K219 Gastro-esophageal reflux disease without esophagitis: Secondary | ICD-10-CM | POA: Insufficient documentation

## 2012-11-12 DIAGNOSIS — Z8719 Personal history of other diseases of the digestive system: Secondary | ICD-10-CM | POA: Insufficient documentation

## 2012-11-12 DIAGNOSIS — W1809XA Striking against other object with subsequent fall, initial encounter: Secondary | ICD-10-CM | POA: Insufficient documentation

## 2012-11-12 DIAGNOSIS — I2510 Atherosclerotic heart disease of native coronary artery without angina pectoris: Secondary | ICD-10-CM | POA: Insufficient documentation

## 2012-11-12 DIAGNOSIS — Y9389 Activity, other specified: Secondary | ICD-10-CM | POA: Insufficient documentation

## 2012-11-12 DIAGNOSIS — Z860100 Personal history of colon polyps, unspecified: Secondary | ICD-10-CM | POA: Insufficient documentation

## 2012-11-12 DIAGNOSIS — I10 Essential (primary) hypertension: Secondary | ICD-10-CM | POA: Insufficient documentation

## 2012-11-12 DIAGNOSIS — Z79899 Other long term (current) drug therapy: Secondary | ICD-10-CM | POA: Insufficient documentation

## 2012-11-12 DIAGNOSIS — S0990XA Unspecified injury of head, initial encounter: Secondary | ICD-10-CM | POA: Insufficient documentation

## 2012-11-12 DIAGNOSIS — W19XXXA Unspecified fall, initial encounter: Secondary | ICD-10-CM

## 2012-11-12 DIAGNOSIS — D509 Iron deficiency anemia, unspecified: Secondary | ICD-10-CM | POA: Insufficient documentation

## 2012-11-12 DIAGNOSIS — S0101XA Laceration without foreign body of scalp, initial encounter: Secondary | ICD-10-CM

## 2012-11-12 DIAGNOSIS — S0100XA Unspecified open wound of scalp, initial encounter: Secondary | ICD-10-CM | POA: Insufficient documentation

## 2012-11-12 DIAGNOSIS — E039 Hypothyroidism, unspecified: Secondary | ICD-10-CM | POA: Insufficient documentation

## 2012-11-12 DIAGNOSIS — J4489 Other specified chronic obstructive pulmonary disease: Secondary | ICD-10-CM | POA: Insufficient documentation

## 2012-11-12 DIAGNOSIS — F17200 Nicotine dependence, unspecified, uncomplicated: Secondary | ICD-10-CM | POA: Insufficient documentation

## 2012-11-12 DIAGNOSIS — Z8669 Personal history of other diseases of the nervous system and sense organs: Secondary | ICD-10-CM | POA: Insufficient documentation

## 2012-11-12 DIAGNOSIS — E785 Hyperlipidemia, unspecified: Secondary | ICD-10-CM | POA: Insufficient documentation

## 2012-11-12 MED ORDER — TETANUS-DIPHTH-ACELL PERTUSSIS 5-2.5-18.5 LF-MCG/0.5 IM SUSP
0.5000 mL | Freq: Once | INTRAMUSCULAR | Status: AC
  Administered 2012-11-13: 0.5 mL via INTRAMUSCULAR
  Filled 2012-11-12: qty 0.5

## 2012-11-12 NOTE — Telephone Encounter (Signed)
Pt daughter states that Pt has fallen again and this time she has a cut on back of her head. Pt daughter would like to know what to do about all these falls Pt is having. Pt schedule for OV tomorrow but advise if Pt condition worsen over night she needs to be seen in ED Pt daughter OK.

## 2012-11-12 NOTE — Telephone Encounter (Signed)
If she has an open laceration which  is bleeding; she should be seen in urgent care or emergency room for suturing. Was she seen by a neurologist while hospitalized?

## 2012-11-12 NOTE — Telephone Encounter (Signed)
Spoke with patient's daughter, patient with pending appointment for Neurology Dec 02, 2012 Patient with a small dash in her head that is not deep and is not currently bleeding, patient's daughter would like to keep pending appointment with Dr.Hopper tomorrow. Olegario Messier was informed to take her mother to the ER or Urgent Care if any severe pain or bleeding.

## 2012-11-12 NOTE — Telephone Encounter (Signed)
Left message to call office

## 2012-11-12 NOTE — ED Notes (Signed)
Pt from home with reports of losing balance and hitting head over cabinet handle resulting in a laceration. Pt reports that accident happened around 1400 today and that family "made me come to hospital, I was fine". Pt denies LOC or taking blood thinners but endorses headache.
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
## 2012-11-12 NOTE — Telephone Encounter (Signed)
Spoke with Brandi Raymond will come by office to have orders pick up.

## 2012-11-13 ENCOUNTER — Ambulatory Visit (INDEPENDENT_AMBULATORY_CARE_PROVIDER_SITE_OTHER): Payer: Medicare Other | Admitting: Internal Medicine

## 2012-11-13 VITALS — BP 118/68 | HR 71 | Temp 97.9°F | Resp 14 | Wt 113.0 lb

## 2012-11-13 DIAGNOSIS — Z9181 History of falling: Secondary | ICD-10-CM

## 2012-11-13 DIAGNOSIS — R296 Repeated falls: Secondary | ICD-10-CM

## 2012-11-13 DIAGNOSIS — I6529 Occlusion and stenosis of unspecified carotid artery: Secondary | ICD-10-CM

## 2012-11-13 LAB — URINALYSIS, ROUTINE W REFLEX MICROSCOPIC
Bilirubin Urine: NEGATIVE
Glucose, UA: NEGATIVE mg/dL
Hgb urine dipstick: NEGATIVE
Ketones, ur: NEGATIVE mg/dL
Nitrite: NEGATIVE
Protein, ur: NEGATIVE mg/dL
Specific Gravity, Urine: 1.027 (ref 1.005–1.030)
Urobilinogen, UA: 1 mg/dL (ref 0.0–1.0)
pH: 6 (ref 5.0–8.0)

## 2012-11-13 LAB — URINE MICROSCOPIC-ADD ON

## 2012-11-13 MED ORDER — SERTRALINE HCL 50 MG PO TABS: 50.0000 mg | ORAL_TABLET | Freq: Every day | ORAL | Status: DC

## 2012-11-13 NOTE — Patient Instructions (Addendum)
Monitor your blood pressure on metoprolol 25 mg one half twice a day. If it remains above 135/85 on average, increase the dose back to 25 mg twice a day.  Repeat the isometric exercises discussed 4- 5 times prior to standing if you've been seated for a period of time.  12/16/12 Home Health Certification  & Plan of Care  For 5/07-7/05/14  reviewed & completed. Discrepancies noted between EMR Med Lists (as per Orders of Dr Myra Gianotti & Dr Anne Hahn) & meds listed on form . Handwritten notes made on form  & request made for clarification as follows: Please document the name of the prescribing caregiver beside each medication listed on the patient's Plan of Care medication list.  This is to enhance continuity of care and prevent potential  adverse drug:drug interactions due to duplication of medications as branded and generic forms; repeated medication changes with hospitalizations and/or  at outpatient subspecialty visits; and lack of the EMR in some medical practices. Such risk is greatest with polypharmacy especially among  geriatric patients as has been documented repeatedly by Dr. Haig Prophet and his associates in multiple long-term studies.

## 2012-11-13 NOTE — Progress Notes (Signed)
Subjective:    Patient ID: Brandi Raymond, female    DOB: June 15, 1934, 79 y.o.   MRN: [id number]  HPI  She's had 2 falls 4/26 and 4/29. The episode yesterday occurred after she had stood up after bending over the freezer and lost her balance. She did not lose consciousness but sustained a laceration to the occipital area which required 2 sutures in the emergency room last night. CT revealed no acute process  The episode 4/26 occurred with dizziness after standing from being seated.  Neither episode was associated with any neurologic or cardiac prodrome or definite seizure activity. Her daughter stated she did see some limb movements with the episode 4/26   Review of Systems  Specifically she denied any headache, limb weakness, numbness or tingling in extremities prior to the events. She also noted no palpitations or change in heart rhythm. She denied visual change, auditory change, chest pain, or acute shortness of breath.  She has lost an additional 5 pounds. She continues to have some abdominal discomfort; she's had an extensive GI evaluation by Dr. Jarold Motto. She is not having melena or rectal bleeding.       Objective:   Physical Exam Gen.: Thin but adequately nourished in appearance. Alert, appropriate and cooperative throughout exam. Head: Laceration site appears clean with no evidence of cellulitis. Eyes: No corneal or conjunctival inflammation noted.  Extraocular motion intact w/o nystagmus Ears: External  ear exam reveals no significant lesions or deformities. Canals clear .TMs normal. Nose: External nasal exam reveals no deformity or inflammation. Nasal mucosa are pink and moist. No lesions or exudates noted.   Mouth: Oral mucosa and oropharynx reveal no lesions or exudates. Teeth in good repair. Neck: No deformities, masses, or tenderness noted. Range of motion good. Lungs: Normal respiratory effort; chest expands symmetrically. Lungs are clear to auscultation without rales,  wheezes, or increased work of breathing. Heart: Normal rate and rhythm. Normal S1 and S2. No gallop, click, or rub. S4 w/o murmur.                                 Musculoskeletal/extremities: No clubbing, cyanosis,or  edema .Tone & strength  Normal. Joints  reveal mixed PIP &   DIP changes. Nail health good.  Vascular: Carotid bruits present; L loud & R faint. Neurologic: Alert and oriented x3. Deep tendon reflexes symmetrical and normal.        Skin: Intact without suspicious lesions or rashes. Lymph: No cervical, axillary lymphadenopathy present. Psych: Mood and affect are normal. Normally interactive                                                                                        Assessment & Plan:  #1 recurrent falls. The episode 4/26 may have represented postural hypotension. The episode 4/29 suggest imbalance possibly related to vertebrobasilar insufficiency in the context of atherosclerotic disease.  Plan: She'll be asked to use an extension grasping device for a dense above her head or below the waist. Isometrics will be recommended if she's been lying or seated for a period of time.  She should avoid excess neck rotation or extension. The metoprolol will will be reduced and blood pressure monitored

## 2012-11-14 LAB — URINE CULTURE
Colony Count: NO GROWTH
Culture: NO GROWTH
Special Requests: NORMAL

## 2012-11-15 ENCOUNTER — Telehealth: Payer: Self-pay

## 2012-11-15 ENCOUNTER — Encounter: Payer: Self-pay | Admitting: Surgery

## 2012-11-15 NOTE — Telephone Encounter (Signed)
Done; I still feel PT/OT may be of benefit in assessing her recurrent falls. I'll ask nursing to assess directly to see if such is indicated. PT/OT can be reordered if indicated as per nursing.

## 2012-11-15 NOTE — Telephone Encounter (Signed)
Order was received, completed by provider, faxed back to [phone] and sent for scanning

## 2012-11-15 NOTE — Telephone Encounter (Signed)
Pt w/appt on 11/13/12.

## 2012-11-15 NOTE — Telephone Encounter (Signed)
[facility] called indicating that patient's daughter refused PT and OT and indicated that she would like for her mother to have skilled nursing. Paper will be faxed to side B for MD to complete for additional orders

## 2012-11-18 ENCOUNTER — Other Ambulatory Visit: Payer: Self-pay | Admitting: *Deleted

## 2012-11-18 ENCOUNTER — Encounter: Payer: Self-pay | Admitting: *Deleted

## 2012-11-18 ENCOUNTER — Encounter: Payer: Self-pay | Admitting: Surgery

## 2012-11-18 ENCOUNTER — Ambulatory Visit (INDEPENDENT_AMBULATORY_CARE_PROVIDER_SITE_OTHER): Payer: Medicare Other | Admitting: Surgery

## 2012-11-18 VITALS — BP 104/60 | HR 87 | Ht 64.5 in | Wt 112.0 lb

## 2012-11-18 DIAGNOSIS — K551 Chronic vascular disorders of intestine: Secondary | ICD-10-CM
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
## 2012-11-26 ENCOUNTER — Encounter (HOSPITAL_COMMUNITY): Payer: Self-pay | Admitting: Pharmacy Technician

## 2012-12-02 ENCOUNTER — Ambulatory Visit (INDEPENDENT_AMBULATORY_CARE_PROVIDER_SITE_OTHER): Payer: Medicare Other | Admitting: Neurology

## 2012-12-02 ENCOUNTER — Encounter: Payer: Self-pay | Admitting: Neurology

## 2012-12-02 VITALS — BP 90/60 | HR 53 | Wt 110.0 lb

## 2012-12-02 DIAGNOSIS — R413 Other amnesia: Secondary | ICD-10-CM

## 2012-12-02 DIAGNOSIS — G7000 Myasthenia gravis without (acute) exacerbation: Secondary | ICD-10-CM

## 2012-12-02 DIAGNOSIS — G609 Hereditary and idiopathic neuropathy, unspecified: Secondary | ICD-10-CM

## 2012-12-02 DIAGNOSIS — R55 Syncope and collapse: Secondary | ICD-10-CM

## 2012-12-02 MED ORDER — SODIUM CHLORIDE 0.9 % IV SOLN
INTRAVENOUS | Status: DC
  Administered 2012-12-03: 07:00:00 via INTRAVENOUS
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
## 2012-12-03 ENCOUNTER — Other Ambulatory Visit: Payer: Self-pay | Admitting: Physician Assistant

## 2012-12-03 ENCOUNTER — Encounter (HOSPITAL_COMMUNITY)
Admission: RE | Disposition: A | Discharge status: Home / Self Care | Payer: Self-pay | Source: Ambulatory Visit | Attending: Surgery

## 2012-12-03 ENCOUNTER — Ambulatory Visit (HOSPITAL_COMMUNITY)
Admission: RE | Admit: 2012-12-03 | Discharge: 2012-12-04 | Disposition: A | Discharge status: Home / Self Care | Payer: Medicare Other | Source: Ambulatory Visit | Attending: Surgery | Admitting: Surgery

## 2012-12-03 ENCOUNTER — Telehealth: Payer: Self-pay | Admitting: Surgery

## 2012-12-03 ENCOUNTER — Encounter (HOSPITAL_COMMUNITY): Payer: Self-pay | Admitting: Physician Assistant

## 2012-12-03 ENCOUNTER — Other Ambulatory Visit: Payer: Self-pay | Admitting: *Deleted

## 2012-12-03 DIAGNOSIS — F17200 Nicotine dependence, unspecified, uncomplicated: Secondary | ICD-10-CM | POA: Diagnosis present

## 2012-12-03 DIAGNOSIS — R7309 Other abnormal glucose: Secondary | ICD-10-CM | POA: Diagnosis present

## 2012-12-03 DIAGNOSIS — R001 Bradycardia, unspecified: Secondary | ICD-10-CM

## 2012-12-03 DIAGNOSIS — I252 Old myocardial infarction: Secondary | ICD-10-CM | POA: Insufficient documentation

## 2012-12-03 DIAGNOSIS — I498 Other specified cardiac arrhythmias: Secondary | ICD-10-CM | POA: Insufficient documentation

## 2012-12-03 DIAGNOSIS — K551 Chronic vascular disorders of intestine: Secondary | ICD-10-CM

## 2012-12-03 DIAGNOSIS — J438 Other emphysema: Secondary | ICD-10-CM | POA: Insufficient documentation

## 2012-12-03 DIAGNOSIS — E119 Type 2 diabetes mellitus without complications: Secondary | ICD-10-CM | POA: Insufficient documentation

## 2012-12-03 DIAGNOSIS — I771 Stricture of artery: Secondary | ICD-10-CM | POA: Insufficient documentation

## 2012-12-03 DIAGNOSIS — I774 Celiac artery compression syndrome: Secondary | ICD-10-CM | POA: Insufficient documentation

## 2012-12-03 DIAGNOSIS — I10 Essential (primary) hypertension: Secondary | ICD-10-CM | POA: Diagnosis present

## 2012-12-03 DIAGNOSIS — R55 Syncope and collapse: Secondary | ICD-10-CM

## 2012-12-03 DIAGNOSIS — Z48812 Encounter for surgical aftercare following surgery on the circulatory system: Secondary | ICD-10-CM

## 2012-12-03 DIAGNOSIS — D62 Acute posthemorrhagic anemia: Secondary | ICD-10-CM | POA: Insufficient documentation

## 2012-12-03 DIAGNOSIS — I739 Peripheral vascular disease, unspecified: Secondary | ICD-10-CM | POA: Diagnosis present

## 2012-12-03 DIAGNOSIS — E039 Hypothyroidism, unspecified: Secondary | ICD-10-CM | POA: Diagnosis present

## 2012-12-03 DIAGNOSIS — Z79899 Other long term (current) drug therapy: Secondary | ICD-10-CM | POA: Insufficient documentation

## 2012-12-03 DIAGNOSIS — I447 Left bundle-branch block, unspecified: Secondary | ICD-10-CM | POA: Insufficient documentation

## 2012-12-03 DIAGNOSIS — D509 Iron deficiency anemia, unspecified: Secondary | ICD-10-CM | POA: Diagnosis present

## 2012-12-03 HISTORY — PX: VISCERAL ANGIOGRAM: SHX5515

## 2012-12-03 HISTORY — PX: PERCUTANEOUS STENT INTERVENTION: SHX5500

## 2012-12-03 HISTORY — DX: ST elevation (STEMI) myocardial infarction involving other coronary artery of inferior wall: I21.19

## 2012-12-03 LAB — GLUCOSE, CAPILLARY
Glucose-Capillary: 112 mg/dL — ABNORMAL HIGH (ref 70–99)
Glucose-Capillary: 124 mg/dL — ABNORMAL HIGH (ref 70–99)
Glucose-Capillary: 99 mg/dL (ref 70–99)

## 2012-12-03 LAB — POCT I-STAT, CHEM 8
BUN: 18 mg/dL (ref 6–23)
Calcium, Ion: 1.14 mmol/L (ref 1.13–1.30)
Chloride: 104 mEq/L (ref 96–112)
Creatinine, Ser: 0.7 mg/dL (ref 0.50–1.10)
Glucose, Bld: 87 mg/dL (ref 70–99)
HCT: 32 % — ABNORMAL LOW (ref 36.0–46.0)
Hemoglobin: 10.9 g/dL — ABNORMAL LOW (ref 12.0–15.0)
Potassium: 3.4 mEq/L — ABNORMAL LOW (ref 3.5–5.1)
Sodium: 142 mEq/L (ref 135–145)
TCO2: 27 mmol/L (ref 0–100)

## 2012-12-03 LAB — POCT ACTIVATED CLOTTING TIME
Activated Clotting Time: 361 seconds
Activated Clotting Time: 301 seconds
Activated Clotting Time: 247 seconds
Activated Clotting Time: 192 seconds
Activated Clotting Time: 176 seconds

## 2012-12-03 SURGERY — VISCERAL ANGIOGRAM
Anesthesia: LOCAL

## 2012-12-03 MED ORDER — GABAPENTIN 300 MG PO CAPS
600.0000 mg | ORAL_CAPSULE | Freq: Every day | ORAL | Status: DC
  Administered 2012-12-03: 600 mg via ORAL
  Filled 2012-12-03 (×2): qty 2

## 2012-12-03 MED ORDER — HYDRALAZINE HCL 20 MG/ML IJ SOLN: 10.0000 mg | INTRAMUSCULAR | Status: DC | PRN

## 2012-12-03 MED ORDER — MIDAZOLAM HCL 2 MG/2ML IJ SOLN
INTRAMUSCULAR | Status: AC
  Filled 2012-12-03: qty 2

## 2012-12-03 MED ORDER — CLONAZEPAM 0.5 MG PO TABS
0.5000 mg | ORAL_TABLET | ORAL | Status: DC | PRN
  Administered 2012-12-03: 0.5 mg via ORAL
  Filled 2012-12-03: qty 1

## 2012-12-03 MED ORDER — SODIUM CHLORIDE 0.9 % IV SOLN
INTRAVENOUS | Status: DC
  Administered 2012-12-03: 75 mL/h via INTRAVENOUS

## 2012-12-03 MED ORDER — ACETAMINOPHEN 325 MG PO TABS: 325.0000 mg | ORAL_TABLET | ORAL | Status: DC | PRN

## 2012-12-03 MED ORDER — ATROPINE SULFATE 1 MG/ML IJ SOLN
INTRAMUSCULAR | Status: AC
  Filled 2012-12-03: qty 1

## 2012-12-03 MED ORDER — TRAMADOL HCL 50 MG PO TABS: 50.0000 mg | ORAL_TABLET | Freq: Four times a day (QID) | ORAL | Status: DC | PRN

## 2012-12-03 MED ORDER — NITROGLYCERIN 0.4 MG SL SUBL: 0.4000 mg | SUBLINGUAL_TABLET | SUBLINGUAL | Status: DC | PRN

## 2012-12-03 MED ORDER — ASPIRIN 81 MG PO TABS: 81.0000 mg | ORAL_TABLET | Freq: Every day | ORAL | Status: DC

## 2012-12-03 MED ORDER — ALUM & MAG HYDROXIDE-SIMETH 200-200-20 MG/5ML PO SUSP: 15.0000 mL | ORAL | Status: DC | PRN

## 2012-12-03 MED ORDER — HEPARIN SODIUM (PORCINE) 1000 UNIT/ML IJ SOLN
INTRAMUSCULAR | Status: AC
  Filled 2012-12-03: qty 1

## 2012-12-03 MED ORDER — METOPROLOL TARTRATE 1 MG/ML IV SOLN: 2.0000 mg | INTRAVENOUS | Status: DC | PRN

## 2012-12-03 MED ORDER — ACETAMINOPHEN 650 MG RE SUPP: 325.0000 mg | RECTAL | Status: DC | PRN

## 2012-12-03 MED ORDER — LEVOTHYROXINE SODIUM 112 MCG PO TABS
56.0000 ug | ORAL_TABLET | ORAL | Status: DC
  Administered 2012-12-03: 56 ug via ORAL
  Filled 2012-12-03 (×2): qty 0.5

## 2012-12-03 MED ORDER — DONEPEZIL HCL 5 MG PO TABS
5.0000 mg | ORAL_TABLET | Freq: Every day | ORAL | Status: DC
  Administered 2012-12-03: 5 mg via ORAL
  Filled 2012-12-03 (×2): qty 1

## 2012-12-03 MED ORDER — LABETALOL HCL 5 MG/ML IV SOLN: 10.0000 mg | INTRAVENOUS | Status: DC | PRN

## 2012-12-03 MED ORDER — POTASSIUM CHLORIDE CRYS ER 20 MEQ PO TBCR
40.0000 meq | EXTENDED_RELEASE_TABLET | Freq: Once | ORAL | Status: AC
  Administered 2012-12-03: 40 meq via ORAL
  Filled 2012-12-03: qty 2

## 2012-12-03 MED ORDER — INSULIN ASPART 100 UNIT/ML ~~LOC~~ SOLN: 0.0000 [IU] | Freq: Every day | SUBCUTANEOUS | Status: DC

## 2012-12-03 MED ORDER — GUAIFENESIN-DM 100-10 MG/5ML PO SYRP: 15.0000 mL | ORAL_SOLUTION | ORAL | Status: DC | PRN

## 2012-12-03 MED ORDER — ONDANSETRON HCL 4 MG/2ML IJ SOLN
4.0000 mg | Freq: Four times a day (QID) | INTRAMUSCULAR | Status: DC | PRN
  Administered 2012-12-03: 4 mg via INTRAVENOUS
  Filled 2012-12-03: qty 2

## 2012-12-03 MED ORDER — SIMVASTATIN 10 MG PO TABS
10.0000 mg | ORAL_TABLET | Freq: Every day | ORAL | Status: DC
  Administered 2012-12-03: 10 mg via ORAL
  Filled 2012-12-03 (×2): qty 1

## 2012-12-03 MED ORDER — METOPROLOL TARTRATE 12.5 MG HALF TABLET
12.5000 mg | ORAL_TABLET | Freq: Two times a day (BID) | ORAL | Status: DC
Start: 2012-12-03 — End: 2012-12-03
  Filled 2012-12-03: qty 1

## 2012-12-03 MED ORDER — FENTANYL CITRATE 0.05 MG/ML IJ SOLN
INTRAMUSCULAR | Status: AC
  Filled 2012-12-03: qty 2

## 2012-12-03 MED ORDER — LISINOPRIL 10 MG PO TABS
10.0000 mg | ORAL_TABLET | Freq: Two times a day (BID) | ORAL | Status: DC
  Administered 2012-12-03 – 2012-12-04 (×2): 10 mg via ORAL
  Filled 2012-12-03 (×3): qty 1

## 2012-12-03 MED ORDER — CLOPIDOGREL BISULFATE 75 MG PO TABS
75.0000 mg | ORAL_TABLET | Freq: Every day | ORAL | Status: DC
  Administered 2012-12-04: 75 mg via ORAL
  Filled 2012-12-03 (×2): qty 1

## 2012-12-03 MED ORDER — ASPIRIN EC 81 MG PO TBEC
81.0000 mg | DELAYED_RELEASE_TABLET | Freq: Every day | ORAL | Status: DC
  Administered 2012-12-04: 81 mg via ORAL
  Filled 2012-12-03: qty 1

## 2012-12-03 MED ORDER — LEVOTHYROXINE SODIUM 112 MCG PO TABS: 56.0000 ug | ORAL_TABLET | Freq: Every day | ORAL | Status: DC

## 2012-12-03 MED ORDER — INSULIN ASPART 100 UNIT/ML ~~LOC~~ SOLN: 0.0000 [IU] | Freq: Three times a day (TID) | SUBCUTANEOUS | Status: DC

## 2012-12-03 MED ORDER — NITROGLYCERIN 0.3 MG SL SUBL: 0.3000 mg | SUBLINGUAL_TABLET | SUBLINGUAL | Status: DC | PRN

## 2012-12-03 MED ORDER — NITROGLYCERIN 0.2 MG/ML ON CALL CATH LAB
INTRAVENOUS | Status: AC
  Filled 2012-12-03: qty 1

## 2012-12-03 MED ORDER — LEVOTHYROXINE SODIUM 112 MCG PO TABS
112.0000 ug | ORAL_TABLET | ORAL | Status: DC
  Administered 2012-12-04: 112 ug via ORAL
  Filled 2012-12-03: qty 1

## 2012-12-03 MED ORDER — LIDOCAINE HCL (PF) 1 % IJ SOLN
INTRAMUSCULAR | Status: AC
  Filled 2012-12-03: qty 30

## 2012-12-03 MED ORDER — SERTRALINE HCL 50 MG PO TABS
50.0000 mg | ORAL_TABLET | Freq: Every day | ORAL | Status: DC
  Administered 2012-12-03 – 2012-12-04 (×2): 50 mg via ORAL
  Filled 2012-12-03 (×2): qty 1

## 2012-12-03 MED ORDER — PHENOL 1.4 % MT LIQD: 1.0000 | OROMUCOSAL | Status: DC | PRN

## 2012-12-03 NOTE — Telephone Encounter (Signed)
Pt's vm is not set up yet, sent letter re appt info - kf
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
## 2012-12-03 NOTE — Op Note (Signed)
[facility]  Patient name: ABIGAEL MOGLE MRN: [id number] DOB: Sep 23, 1933 Sex: female  12/03/2012 Pre-operative Diagnosis: Mesenteric stenosis Post-operative diagnosis:  Same Surgeon:  Jorge Ny Procedure Performed:  1.  ultrasound-guided access, right brachial artery  2.  aortic arch angiogram  3.  abdominal aortogram  4.  stent, superior mesenteric artery   Indications:  The patient suffers from chronic abdominal pain. She has known stenosis within the celiac and superior mesenteric artery. I had previously attempted stenting of her superior mesenteric artery for a femoral approach. Do to the angle of the superior mesenteric artery, I was unable to get enough purchase to place a stent. Post procedure, she developed a aortic dissection. This was treated with blood pressure control. She has recovered from this and still suffers from abdominal pain which is to proceed with attempt at superior mesenteric artery stenting from her right brachial artery. She has diffuse disease throughout her left subclavian artery which prohibits utilizing the left arm.  Procedure:  The patient was identified in the holding area and taken to room 8.  The patient was then placed supine on the table and prepped and draped in the usual sterile fashion.  A time out was called.  Ultrasound was used to evaluate the right brachial artery. It was patent without significant calcification. A digital ultrasound image was acquired. Under ultrasound guidance, the right brachial artery was cannulated with a micropuncture needle. A 018 wire was advanced without resistance followed by a micropuncture sheath. Over an 035 Benson wire a 6 French sheath was placed. 2000 units of heparin and 200 mcg of nitroglycerin were administered through the sheath. An additional 6000 units of heparin was given systemically. I then attempted to gain wire access into the descending thoracic aorta. The patient had  a type II aortic arch and this was excessively difficult. I tried every catheter we had available in order to get access into the descending thoracic aorta. I ultimately performed a aortic arch angiogram to further evaluate her left subclavian artery to determine if it could be stented and approach this from the left side. Finally at with one last attempt I was able to get the wire into the descending thoracic aorta and with careful manipulation using a straight catheter and a J-wire I was able to get better purchase so that a wire exchange could be performed. Ultimately I was able to get a multipurpose guide catheter down to the celiac axis. Again this was very difficult as the wire continued to buckle into the descending aorta. With the assistance of a 5 French catheter through the 6 [place] guide I was able to cannulate the superior mesenteric artery the 018 wire was then advanced out into the superior mesenteric artery. Primary stenting was then performed using a 6 x 15 Herculink balloon expandable stent. Followup imaging revealed resolution of the superior mesenteric artery stenosis. At this point, the catheters and wires were removed. The patient tolerated the procedure well without significant complications.   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 Significant calcification is noted throughout the aortic arch. A high-grade stenosis is identified within the proximal left subclavian artery which is heavily calcified.  Abdominal aortogram:  High-grade proximal celiac artery stenosis, 80% is identified. I high-grade, 80% superior mesenteric artery stenosis is identified within large [place]  Impression:  #1  successful stenting of the superior mesenteric artery from the right brachial approach using a  6 x 15 balloon expandable stents. Stenosis was 80% initially down to less than 10% after stenting.  #2  this was an extremely difficult procedure getting access from the right  brachial artery. This was due to a type II heavily calcified aortic arch. She has a high-grade left subclavian artery stenosis which prohibits coming from the left arm  #3  residual celiac artery stenosis was not treated today do to the difficulty in treating the superior mesenteric artery as well as the amount of fluoroscopy time    V. Durene Cal, M.D. [facility] Office: [phone] Pager:  [phone]

## 2012-12-03 NOTE — Interval H&P Note (Signed)
History and Physical Interval Note:  12/03/2012 7:25 AM  Brandi Raymond  has presented today for surgery, with the diagnosis of mesentric asm  The various methods of treatment have been discussed with the patient and family. After consideration of risks, benefits and other options for treatment, the patient has consented to  Procedure(s): VISCERAL ANGIOGRAM (N/A) as a surgical intervention .  The patient's history has been reviewed, patient examined, no change in status, stable for surgery.  I have reviewed the patient's chart and labs.  Questions were answered to the patient's satisfaction.     BRABHAM IV, V. WELLS
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
## 2012-12-03 NOTE — Progress Notes (Signed)
Orthostatic vital signs per MD order. Laying : HR 58, BP 134/73, Sitting : HR 62 BP 155/92, Standing: HR 68, BP 178/133. Patient had no complaints of dizzyness or weakness.

## 2012-12-03 NOTE — Telephone Encounter (Signed)
Message copied by Margaretmary Eddy on Tue Dec 03, 2012  4:24 PM ------      Message from: Melene Plan      Created: Tue Dec 03, 2012 11:43 AM                   ----- Message -----         From: Nada Libman, MD         Sent: 12/03/2012  10:42 AM           To: Reuel Derby, Melene Plan, RN            12/03/2012:            Surgeon:  Jorge Ny      Procedure Performed:       1.  ultrasound-guided access, right brachial artery       2.  aortic arch angiogram       3.  abdominal aortogram       4.  stent, superior mesenteric artery            She will be admitted to the hospital for observation overnight. He'll then see her back in the office at one month with an abdominal ultrasound ------

## 2012-12-04 ENCOUNTER — Encounter (HOSPITAL_COMMUNITY): Payer: Self-pay | Admitting: *Deleted

## 2012-12-04 ENCOUNTER — Other Ambulatory Visit: Payer: Self-pay | Admitting: *Deleted

## 2012-12-04 LAB — GLUCOSE, CAPILLARY
Glucose-Capillary: 96 mg/dL (ref 70–99)
Glucose-Capillary: 102 mg/dL — ABNORMAL HIGH (ref 70–99)

## 2012-12-04 LAB — BASIC METABOLIC PANEL
BUN: 13 mg/dL (ref 6–23)
CO2: 26 mEq/L (ref 19–32)
Calcium: 8.3 mg/dL — ABNORMAL LOW (ref 8.4–10.5)
Chloride: 105 mEq/L (ref 96–112)
Creatinine, Ser: 0.56 mg/dL (ref 0.50–1.10)
GFR calc Af Amer: >90 mL/min (ref >90)
GFR calc non Af Amer: 88 mL/min — ABNORMAL LOW (ref >90)
Glucose, Bld: 88 mg/dL (ref 70–99)
Potassium: 3.8 mEq/L (ref 3.5–5.1)
Sodium: 140 mEq/L (ref 135–145)

## 2012-12-04 LAB — CBC
HCT: 29.4 % — ABNORMAL LOW (ref 36.0–46.0)
Hemoglobin: 9.5 g/dL — ABNORMAL LOW (ref 12.0–15.0)
MCH: 27.5 pg (ref 26.0–34.0)
MCHC: 32.3 g/dL (ref 30.0–36.0)
MCV: 85.2 fL (ref 78.0–100.0)
Platelets: 161 10*3/uL (ref 150–400)
RBC: 3.45 MIL/uL — ABNORMAL LOW (ref 3.87–5.11)
RDW: 14.4 % (ref 11.5–15.5)
WBC: 7.6 10*3/uL (ref 4.0–10.5)

## 2012-12-04 LAB — TSH: TSH: 1.193 u[IU]/mL (ref 0.350–4.500)

## 2012-12-04 MED ORDER — CLOPIDOGREL BISULFATE 75 MG PO TABS: 75.0000 mg | ORAL_TABLET | Freq: Every day | ORAL | Status: DC

## 2012-12-04 MED ORDER — TRAMADOL HCL 50 MG PO TABS: 50.0000 mg | ORAL_TABLET | Freq: Four times a day (QID) | ORAL | Status: DC | PRN

## 2012-12-04 NOTE — Discharge Summary (Signed)
[facility] Discharge Summary  Brandi Raymond May 17, 1934 79 y.o. female  [id number]  Admission Date: 12/03/2012  Discharge Date: 12/04/12  Physician: Nada Libman, MD  Admission Diagnosis: mesentric asm   HPI:   This is a 79 y.o. female who underwent an attempt at mesenteric artery stenting approximately one month ago. She had very difficult anatomy to gain access into the superior mesenteric artery to place a stent. I was ultimately unable to do so. In the recovery she began complaining of severe back pain which was initially thought to be secondary to positioning of the bed. I ended up getting a CT scan because her pain persisted and now scored about a dissection. 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 She was extremely hypertensive and therefore was admitted to the hospital for blood pressure control. She ultimately ended up in the ICU for continuous medication. She was able to be discharged several days later. She has had sporadic back pain but that is largely resolved. She continues to have difficulty with eating. She is also beginning of her pneumonia. She had a CT scan about one month ago showed left pleural effusion. This was followed up with an x-ray that showed decreasing diffusion. She has been on antibiotics in the past. She does have a productive cough currently  Hospital Course:  The patient was admitted to the hospital and taken to the operating room on 12/03/2012 and underwent: 1. ultrasound-guided access, right brachial artery  2. aortic arch angiogram  3. abdominal aortogram  4. stent, superior mesenteric artery    The pt tolerated the procedure well and was transported to the PACU in good condition. She did have some acute surgical blood loss anemia that she tolerated well.  She was also having bradycardia and a cardiology consult was obtained.  She appears to have has post-procedural junctional rhythm (by report - no strips of  ECGs obtained) which I suspect may have been related to high vagal tone. She is now back in sinus rhythm/sinus bradycardia. In looking at previous ECGs she does appear to have significant underlying conduction disease with chronic bradycardia and LBBB. Thus, will stop lopressor completely and order 2 week event monitor. Will monitor overnight and suspect she will be able to go home in am. No indication for pacer at this point.  Her metoprolol was discontinued at this time.  She did have significant ecchymosis on the right arm and small hematoma.    The remainder of the hospital course consisted of increasing mobilization and increasing intake of solids without difficulty.  CBC    Component Value Date/Time   WBC 7.6 12/04/2012 0400   RBC 3.45* 12/04/2012 0400   HGB 9.5* 12/04/2012 0400   HCT 29.4* 12/04/2012 0400   PLT 161 12/04/2012 0400   MCV 85.2 12/04/2012 0400   MCH 27.5 12/04/2012 0400   MCHC 32.3 12/04/2012 0400   RDW 14.4 12/04/2012 0400   LYMPHSABS 2.0 08/07/2012 1441   MONOABS 0.6 08/07/2012 1441   EOSABS 0.1 08/07/2012 1441   BASOSABS 0.0 08/07/2012 1441    BMET    Component Value Date/Time   NA 140 12/04/2012 0400   K 3.8 12/04/2012 0400   CL 105 12/04/2012 0400   CO2 26 12/04/2012 0400   GLUCOSE 88 12/04/2012 0400   BUN 13 12/04/2012 0400   CREATININE 0.56 12/04/2012 0400   CALCIUM 8.3* 12/04/2012 0400   GFRNONAA 88* 12/04/2012 0400   GFRAA >90 12/04/2012 0400  Discharge Instructions:   The patient is discharged to home with extensive instructions on wound care and progressive ambulation.  They are instructed not to drive or perform any heavy lifting until returning to see the physician in his office.  Discharge Orders   Future Appointments Provider Department Dept Phone   12/06/2012 2:00 PM [facility] [employer] Main Office [place]) [phone]   01/13/2013 9:00 AM [facility] [phone]   Eat a light meal the  night before the exam but please avoid gaseous foods.   Nothing to eat or drink for at least 8 hours prior to the exam. No gum chewing or smoking the morning of the exam. Please take your morning medications with small sips of water, especially blood pressure medication. If you have several vascular lab exams and will see physician, please bring a snack with you.   01/13/2013 9:30 AM Nada Libman, MD [facility] [phone]   01/20/2013 11:45 AM Rollene Rotunda, MD [facility] Main Office [place]) [phone]   06/05/2013 2:30 PM York Spaniel, MD [facility] [phone]   Future Orders Complete By Expires     Call MD for:  redness, tenderness, or signs of infection (pain, swelling, bleeding, redness, odor or green/yellow discharge around incision site)  As directed     Call MD for:  severe or increased pain, loss or decreased feeling  in affected limb(s)  As directed     Call MD for:  temperature >100.5  As directed     Discharge wound care:  As directed     Comments:      Shower daily with soap and water starting 12/05/12    Driving Restrictions  As directed     Comments:      No driving for 1 weeks    Lifting restrictions  As directed     Comments:      No heavy lifting for 4-6 weeks    Nursing communication  As directed     Scheduling Instructions:      Please give paper Rx to patient at discharge.    Resume previous diet  As directed        Discharge Diagnosis:  mesentric asm  Secondary Diagnosis: Patient Active Problem List   Diagnosis Date Noted  . Symptomatic bradycardia 12/03/2012  . Thoracic aneurysm without mention of rupture 11/04/2012  . Chronic mesenteric ischemia 10/08/2012  . Memory deficit 06/20/2012  . Irritable bowel syndrome 06/20/2012  . Ocular myasthenia gravis 06/20/2012  . PVD (peripheral vascular disease) 06/20/2012  . Unspecified hereditary and idiopathic peripheral neuropathy 05/22/2012  .  Syncope and collapse 05/22/2012  . Syncope 08/28/2011  . Labial cyst 12/13/2010  . UNSPECIFIED ANEMIA 07/22/2010  . EXOPHTHALMOS 10/18/2009  . CAROTID ARTERY STENOSIS 08/17/2009  . ABDOMINAL BRUIT 08/17/2009  . ANEMIA-IRON DEFICIENCY 09/11/2008  . C A D 09/05/2008  . Unspecified hypothyroidism 05/26/2008  . VITAMIN D DEFICIENCY 05/26/2008  . OSTEOPENIA 05/12/2008  . CHOLELITHIASIS 12/06/2007  . DIVERTICULOSIS, COLON 12/05/2007  . DIABETES MELLITUS, TYPE II, CONTROLLED 08/19/2007  . HYPERLIPIDEMIA 08/19/2007  . OTHER EMPHYSEMA 08/19/2007  . CIGARETTE SMOKER 02/06/2007  . HYPERTENSION, ESSENTIAL NOS 02/06/2007  . COLONIC POLYPS 11/21/2004   Past Medical History  Diagnosis Date  . COPD (chronic obstructive pulmonary disease)   . Hypothyroidism     affecting the left eye, proptosis  . CAD (coronary artery disease)     Dr Antoine Poche  . HTN (  hypertension)   . Iron deficiency anemia   . Myocardial infarction 1993  . Ocular myasthenia gravis     Dr Anne Hahn  . Syncope 1998  . Diverticulosis   . GERD (gastroesophageal reflux disease) 09/15/1991    Dr Jarold Motto  . Hyperplastic polyps of stomach 11/2007    colonoscopy  . HLD (hyperlipidemia)   . Cervical spine fracture   . Hiatal hernia 09/15/1991  . Gastritis 09/15/1991  . Adenomatous colon polyp   . Myasthenia gravis     With ocular features  . Dyslipidemia   . Hypertension   . Memory loss   . Hip fracture, right   . Acute MI inferior subsequent episode care 1993    PTCA RCA       Medication List    STOP taking these medications       metoprolol tartrate 25 MG tablet  Commonly known as:  LOPRESSOR      TAKE these medications       aspirin 81 MG tablet  Take 81 mg by mouth daily.     CALCIUM-VITAMIN D PO  Take 1 tablet by mouth 2 (two) times daily.     clonazePAM 1 MG tablet  Commonly known as:  KLONOPIN  Take 0.5 mg by mouth at bedtime as needed for anxiety.     clopidogrel 75 MG tablet  Commonly known as:   PLAVIX  Take 1 tablet (75 mg total) by mouth daily with breakfast.     donepezil 10 MG tablet  Commonly known as:  ARICEPT  Take 5 mg by mouth at bedtime.     HYDROcodone-homatropine 5-1.5 MG/5ML syrup  Commonly known as:  HYCODAN  TAKE BY MOUTH EVERY 8 HOURS AS NEEDED COUGH     levothyroxine 112 MCG tablet  Commonly known as:  SYNTHROID, LEVOTHROID  Take 56-112 mcg by mouth daily. 1 by mouth daily EXCEPT 1/2 on T/TH     lisinopril 10 MG tablet  Commonly known as:  PRINIVIL,ZESTRIL  Take 1 tablet (10 mg total) by mouth 2 (two) times daily.     multivitamin tablet  Take 1 tablet by mouth daily.     NEURONTIN 300 MG capsule  Generic drug:  gabapentin  Take 600 mg by mouth at bedtime.     nitroGLYCERIN 0.3 MG SL tablet  Commonly known as:  NITROSTAT  Place 0.3 mg under the tongue every 5 (five) minutes as needed for chest pain.     pravastatin 40 MG tablet  Commonly known as:  PRAVACHOL  Take 40 mg by mouth daily.     sertraline 50 MG tablet  Commonly known as:  ZOLOFT  Take 1 tablet (50 mg total) by mouth daily.     traMADol 50 MG tablet  Commonly known as:  ULTRAM  Take 1 tablet (50 mg total) by mouth every 6 (six) hours as needed for pain.        Tramadol #30 No Refill  Disposition: home  Patient's condition: is Good  Follow up: 1. Dr. Myra Gianotti in one month 2. [facility] within the week.   Doreatha Massed, PA-C [facility] [phone] 12/04/2012  8:43 AM

## 2012-12-04 NOTE — Progress Notes (Signed)
Pt discharge home with husband. Discharge instructions discussed and explained to patient and husband. Prescriptions, f/u appt. And exit care notes given to patient. Brandi Raymond

## 2012-12-04 NOTE — Plan of Care (Signed)
Problem: Phase I Progression Outcomes Goal: Vascular site scale level 0 - I Vascular Site Scale Level 0: No bruising/bleeding/hematoma Level I (Mild): Bruising/Ecchymosis, minimal bleeding/ooozing, palpable hematoma < 3 cm Level II (Moderate): Bleeding not affecting hemodynamic parameters, pseudoaneurysm, palpable hematoma > 3 cm Level III (Severe) Bleeding which affects hemodynamic parameters or retroperitoneal hemorrhage  Outcome: Completed/Met Date Met:  12/04/12 Level 1

## 2012-12-04 NOTE — Progress Notes (Signed)
[facility] Progress Note  12/04/2012 8:20 AM 1 Day Post-Op  Subjective:  Ready to go home.  Some nausea yesterday, but none today.  Tm 99.1 now afebrile HR 40's-60's regular 110's-130's systolic 97% RA  Filed Vitals:   12/04/12 0800  BP: 139/66  Pulse: 56  Temp:   Resp: 23    Physical Exam: Incisions:  Right arm with ecchymosis and hematoma  Extremities:  + palpable right radial pulse.  Grips equal bilaterally; motor and sensory are in tact. Abdomen:  Soft; NT/ND +BS  CBC    Component Value Date/Time   WBC 7.6 12/04/2012 0400   RBC 3.45* 12/04/2012 0400   HGB 9.5* 12/04/2012 0400   HCT 29.4* 12/04/2012 0400   PLT 161 12/04/2012 0400   MCV 85.2 12/04/2012 0400   MCH 27.5 12/04/2012 0400   MCHC 32.3 12/04/2012 0400   RDW 14.4 12/04/2012 0400   LYMPHSABS 2.0 08/07/2012 1441   MONOABS 0.6 08/07/2012 1441   EOSABS 0.1 08/07/2012 1441   BASOSABS 0.0 08/07/2012 1441    BMET    Component Value Date/Time   NA 140 12/04/2012 0400   K 3.8 12/04/2012 0400   CL 105 12/04/2012 0400   CO2 26 12/04/2012 0400   GLUCOSE 88 12/04/2012 0400   BUN 13 12/04/2012 0400   CREATININE 0.56 12/04/2012 0400   CALCIUM 8.3* 12/04/2012 0400   GFRNONAA 88* 12/04/2012 0400   GFRAA >90 12/04/2012 0400    INR    Component Value Date/Time   INR 0.9 01/07/2009 1414     Intake/Output Summary (Last 24 hours) at 12/04/12 0820 Last data filed at 12/04/12 0800  Gross per 24 hour  Intake 1433.75 ml  Output    503 ml  Net 930.75 ml     Assessment/Plan:  79 y.o. female is s/p:  1. ultrasound-guided access, right brachial artery  2. aortic arch angiogram  3. abdominal aortogram  4. stent, superior mesenteric artery   1 Day Post-Op  -acute surgical blood loss anemia-tolerating -cardiology consult obtained for bradycardia-metoprolol discontinued and a heart monitor will be ordered for out patient.  At this time, there is no indication for PPM. HR is in 50's-60's this am. -discontinue  metoprolol on home meds  -plavix started this am for stent to SMA -continue to monitor right arm.   Doreatha Massed, PA-C [facility] [phone] 12/04/2012 8:20 AM

## 2012-12-06 ENCOUNTER — Encounter: Payer: Self-pay | Admitting: *Deleted

## 2012-12-06 ENCOUNTER — Ambulatory Visit (INDEPENDENT_AMBULATORY_CARE_PROVIDER_SITE_OTHER): Payer: Medicare Other | Admitting: *Deleted

## 2012-12-06 DIAGNOSIS — R42 Dizziness and giddiness: Secondary | ICD-10-CM

## 2012-12-06 DIAGNOSIS — R55 Syncope and collapse: Secondary | ICD-10-CM

## 2012-12-06 DIAGNOSIS — R001 Bradycardia, unspecified: Secondary | ICD-10-CM

## 2012-12-06 DIAGNOSIS — I498 Other specified cardiac arrhythmias: Secondary | ICD-10-CM

## 2012-12-06 NOTE — Progress Notes (Signed)
Patient ID: Brandi Raymond, female   DOB: [birth year], 79 y.o.   MRN: [id number] 14 Day E-cardio monitor placed on patient.

## 2012-12-06 NOTE — Progress Notes (Signed)
Patient ID: Brandi Raymond, female   DOB: [birth year], 80 y.o.   MRN: [id number] 14 Day e-cardio monitor applied to patient.

## 2012-12-07 NOTE — Discharge Summary (Signed)
I agree with the above  Brandi Raymond 

## 2012-12-09 ENCOUNTER — Other Ambulatory Visit: Payer: Self-pay | Admitting: Internal Medicine

## 2012-12-15 ENCOUNTER — Encounter: Payer: Self-pay | Admitting: Internal Medicine

## 2012-12-16 ENCOUNTER — Other Ambulatory Visit: Payer: Self-pay

## 2012-12-16 DIAGNOSIS — R269 Unspecified abnormalities of gait and mobility: Secondary | ICD-10-CM

## 2012-12-16 DIAGNOSIS — R627 Adult failure to thrive: Secondary | ICD-10-CM

## 2012-12-16 DIAGNOSIS — I10 Essential (primary) hypertension: Secondary | ICD-10-CM

## 2012-12-16 DIAGNOSIS — F329 Major depressive disorder, single episode, unspecified: Secondary | ICD-10-CM

## 2012-12-16 DIAGNOSIS — J449 Chronic obstructive pulmonary disease, unspecified: Secondary | ICD-10-CM

## 2012-12-16 MED ORDER — PRAVASTATIN SODIUM 40 MG PO TABS: 40.0000 mg | ORAL_TABLET | Freq: Every day | ORAL | Status: DC

## 2012-12-18 ENCOUNTER — Telehealth: Payer: Self-pay | Admitting: *Deleted

## 2012-12-18 NOTE — Telephone Encounter (Signed)
HH orders signed and faxed to [facility] for certification period 11/20/2012-01/18/2013.

## 2013-01-10 ENCOUNTER — Encounter: Payer: Self-pay | Admitting: Surgery

## 2013-01-13 ENCOUNTER — Ambulatory Visit (INDEPENDENT_AMBULATORY_CARE_PROVIDER_SITE_OTHER): Payer: Medicare Other | Admitting: Surgery

## 2013-01-13 ENCOUNTER — Encounter: Payer: Self-pay | Admitting: Cardiology

## 2013-01-13 ENCOUNTER — Encounter: Payer: Self-pay | Admitting: Surgery

## 2013-01-13 ENCOUNTER — Other Ambulatory Visit (INDEPENDENT_AMBULATORY_CARE_PROVIDER_SITE_OTHER): Payer: Medicare Other | Admitting: *Deleted

## 2013-01-13 ENCOUNTER — Encounter: Payer: Self-pay | Admitting: Internal Medicine

## 2013-01-13 VITALS — BP 132/67 | HR 63 | Ht 64.5 in | Wt 122.0 lb

## 2013-01-13 DIAGNOSIS — I771 Stricture of artery: Secondary | ICD-10-CM

## 2013-01-13 DIAGNOSIS — Z48812 Encounter for surgical aftercare following surgery on the circulatory system: Secondary | ICD-10-CM

## 2013-01-13 DIAGNOSIS — K551 Chronic vascular disorders of intestine: Secondary | ICD-10-CM

## 2013-01-13 NOTE — Addendum Note (Signed)
Addended by: Adria Dill L on: 01/13/2013 11:16 AM   Modules accepted: Orders
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
## 2013-01-14 ENCOUNTER — Encounter: Payer: Self-pay | Admitting: Gastroenterology

## 2013-01-15 ENCOUNTER — Encounter: Payer: Self-pay | Admitting: Gastroenterology

## 2013-01-15 NOTE — Telephone Encounter (Signed)
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 You have had a stent placed in the mesentery which hopefully helped the ischemia to your bowel. We are sorry we have nothing else to offer you. 

## 2013-01-15 NOTE — Telephone Encounter (Signed)
Brandi Raymond, Dr Jarold Motto states you have ischemic bowel disease and we have done all we can for you. You have had a stent placed in the mesentery which hopefully helped the ischemia to your bowel. We are sorry we have nothing else to offer you.

## 2013-01-20 ENCOUNTER — Ambulatory Visit (INDEPENDENT_AMBULATORY_CARE_PROVIDER_SITE_OTHER): Payer: Medicare Other | Admitting: Cardiology

## 2013-01-20 ENCOUNTER — Encounter: Payer: Self-pay | Admitting: Cardiology

## 2013-01-20 ENCOUNTER — Encounter: Payer: Medicare Other | Admitting: Cardiology

## 2013-01-20 ENCOUNTER — Encounter: Payer: Self-pay | Admitting: Internal Medicine

## 2013-01-20 DIAGNOSIS — R42 Dizziness and giddiness: Secondary | ICD-10-CM | POA: Insufficient documentation

## 2013-01-20 DIAGNOSIS — I10 Essential (primary) hypertension: Secondary | ICD-10-CM

## 2013-01-20 DIAGNOSIS — I6529 Occlusion and stenosis of unspecified carotid artery: Secondary | ICD-10-CM

## 2013-01-20 MED ORDER — LISINOPRIL 5 MG PO TABS: 5.0000 mg | ORAL_TABLET | Freq: Two times a day (BID) | ORAL | Status: DC

## 2013-01-20 NOTE — Patient Instructions (Addendum)
Please decrease Lisinopril to 5 mg twice a day. Continue all other medications as listed  Wear compression stocking daily.  You may remove them at bedtime.  Follow up in 4 months with Dr Antoine Poche.
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
## 2013-01-27 ENCOUNTER — Encounter: Payer: Self-pay | Admitting: Surgery

## 2013-01-27 ENCOUNTER — Encounter: Payer: Self-pay | Admitting: Neurology

## 2013-01-27 ENCOUNTER — Other Ambulatory Visit: Payer: Self-pay | Admitting: *Deleted

## 2013-01-27 DIAGNOSIS — I739 Peripheral vascular disease, unspecified: Secondary | ICD-10-CM

## 2013-01-27 MED ORDER — CLOPIDOGREL BISULFATE 75 MG PO TABS: 75.0000 mg | ORAL_TABLET | Freq: Every day | ORAL | Status: DC

## 2013-01-27 NOTE — Progress Notes (Signed)
Plavix prescription sent to [facility] per patient request message sent today.

## 2013-01-28 MED ORDER — DONEPEZIL HCL 5 MG PO TABS: 5.0000 mg | ORAL_TABLET | Freq: Every day | ORAL | Status: DC

## 2013-01-28 NOTE — Telephone Encounter (Signed)
Rx has been sent  

## 2013-02-19 ENCOUNTER — Other Ambulatory Visit: Payer: Self-pay

## 2013-03-08 ENCOUNTER — Other Ambulatory Visit: Payer: Self-pay | Admitting: Internal Medicine

## 2013-03-12 NOTE — Telephone Encounter (Signed)
Last seen 11/13/12 and filled 11/04/12 #30 with 2 rf. UDS 08/07/12 high risk. Please advise      KP

## 2013-03-13 ENCOUNTER — Other Ambulatory Visit: Payer: Self-pay | Admitting: Internal Medicine

## 2013-04-09 ENCOUNTER — Other Ambulatory Visit: Payer: Self-pay | Admitting: *Deleted

## 2013-04-09 ENCOUNTER — Encounter: Payer: Self-pay | Admitting: Cardiology

## 2013-04-09 DIAGNOSIS — I10 Essential (primary) hypertension: Secondary | ICD-10-CM

## 2013-04-09 MED ORDER — LISINOPRIL 5 MG PO TABS: 5.0000 mg | ORAL_TABLET | Freq: Two times a day (BID) | ORAL | Status: DC

## 2013-04-10 ENCOUNTER — Other Ambulatory Visit: Payer: Self-pay | Admitting: Family Medicine

## 2013-04-10 NOTE — Telephone Encounter (Signed)
OK X1 

## 2013-04-10 NOTE — Telephone Encounter (Signed)
Last visit 11/13/12, last filled on 03/08/13 #30, 0 refills, UDS 08/07/12-high risk, contract on file. Please advise. SW

## 2013-04-11 ENCOUNTER — Telehealth: Payer: Self-pay | Admitting: *Deleted

## 2013-04-11 ENCOUNTER — Encounter: Payer: Self-pay | Admitting: Cardiology

## 2013-04-11 ENCOUNTER — Encounter: Payer: Self-pay | Admitting: Surgery

## 2013-04-11 NOTE — Telephone Encounter (Signed)
Med refill

## 2013-04-11 NOTE — Telephone Encounter (Signed)
Called and spoke with patient to inform her that her Clonazepam RX was ready for pickup at our front desk and that a UDS is required.

## 2013-04-12 ENCOUNTER — Other Ambulatory Visit: Payer: Self-pay | Admitting: Family Medicine

## 2013-05-01 ENCOUNTER — Other Ambulatory Visit: Payer: Self-pay | Admitting: *Deleted

## 2013-05-01 MED ORDER — SERTRALINE HCL 50 MG PO TABS: 50.0000 mg | ORAL_TABLET | Freq: Every day | ORAL | Status: DC

## 2013-05-01 NOTE — Telephone Encounter (Signed)
Sertraline refill sent to pharmacy.

## 2013-05-08 ENCOUNTER — Other Ambulatory Visit: Payer: Self-pay | Admitting: General Practice

## 2013-05-08 MED ORDER — SERTRALINE HCL 50 MG PO TABS: 50.0000 mg | ORAL_TABLET | Freq: Every day | ORAL | Status: DC

## 2013-05-10 ENCOUNTER — Other Ambulatory Visit: Payer: Self-pay | Admitting: Neurology

## 2013-05-10 ENCOUNTER — Other Ambulatory Visit: Payer: Self-pay | Admitting: Family Medicine

## 2013-05-13 ENCOUNTER — Telehealth: Payer: Self-pay | Admitting: *Deleted

## 2013-05-13 ENCOUNTER — Other Ambulatory Visit: Payer: Self-pay | Admitting: *Deleted

## 2013-05-13 MED ORDER — CLONAZEPAM 1 MG PO TABS: ORAL_TABLET | ORAL | Status: DC

## 2013-05-13 NOTE — Telephone Encounter (Signed)
clonazePAM (KLONOPIN) 1 MG tablet Last refill: 04/10/2013 Last OV: 11/13/2012 High Risk

## 2013-05-13 NOTE — Telephone Encounter (Signed)
OK X 1 but OV needed  With ALL actual pill bottles before next refill. Make appt

## 2013-05-15 ENCOUNTER — Other Ambulatory Visit: Payer: Self-pay | Admitting: Family Medicine

## 2013-05-15 ENCOUNTER — Encounter: Payer: Self-pay | Admitting: Internal Medicine

## 2013-05-15 NOTE — Telephone Encounter (Signed)
Hopper pt

## 2013-05-16 ENCOUNTER — Other Ambulatory Visit: Payer: Self-pay | Admitting: Family Medicine

## 2013-05-16 NOTE — Telephone Encounter (Signed)
Called and spoke with the pt's daughter and informed her that the pt's rx is ready, but she will need to pick it up b/c we need to get a urine for UDS.    Daughter(Cathy) stated she will bring her to pick-up rx and to leave a urine.//AB/CMA

## 2013-05-16 NOTE — Telephone Encounter (Signed)
hopp pt.  

## 2013-05-21 ENCOUNTER — Ambulatory Visit: Payer: Medicare Other

## 2013-05-22 ENCOUNTER — Other Ambulatory Visit: Payer: Self-pay

## 2013-05-22 ENCOUNTER — Encounter: Payer: Self-pay | Admitting: Cardiology

## 2013-05-22 ENCOUNTER — Ambulatory Visit (INDEPENDENT_AMBULATORY_CARE_PROVIDER_SITE_OTHER): Payer: Medicare Other | Admitting: Cardiology

## 2013-05-22 VITALS — BP 126/68 | HR 79 | Wt 112.0 lb

## 2013-05-22 DIAGNOSIS — Z0000 Encounter for general adult medical examination without abnormal findings: Secondary | ICD-10-CM

## 2013-05-22 DIAGNOSIS — I779 Disorder of arteries and arterioles, unspecified: Secondary | ICD-10-CM

## 2013-05-22 DIAGNOSIS — I10 Essential (primary) hypertension: Secondary | ICD-10-CM

## 2013-05-22 DIAGNOSIS — Z23 Encounter for immunization: Secondary | ICD-10-CM

## 2013-05-22 DIAGNOSIS — I2510 Atherosclerotic heart disease of native coronary artery without angina pectoris: Secondary | ICD-10-CM

## 2013-05-22 MED ORDER — LISINOPRIL 5 MG PO TABS: 5.0000 mg | ORAL_TABLET | Freq: Two times a day (BID) | ORAL | Status: DC

## 2013-05-22 NOTE — Patient Instructions (Signed)
Your physician wants you to follow-up in: April 2015 You will receive a reminder letter in the mail two months in advance. If you don't receive a letter, please call our office to schedule the follow-up appointment.  Your physician has requested that you have a carotid duplex. This test is an ultrasound of the carotid arteries in your neck. It looks at blood flow through these arteries that supply the brain with blood. Allow one hour for this exam. There are no restrictions or special instructions. To be done in April 2015
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
## 2013-05-29 ENCOUNTER — Other Ambulatory Visit: Payer: Self-pay | Admitting: *Deleted

## 2013-05-29 MED ORDER — LEVOTHYROXINE SODIUM 112 MCG PO TABS: 56.0000 ug | ORAL_TABLET | Freq: Every day | ORAL | Status: DC

## 2013-05-29 NOTE — Telephone Encounter (Signed)
Levothyroxine refilled

## 2013-06-04 ENCOUNTER — Encounter: Payer: Self-pay | Admitting: Gastroenterology

## 2013-06-05 ENCOUNTER — Encounter: Payer: Self-pay | Admitting: Neurology

## 2013-06-05 ENCOUNTER — Ambulatory Visit (INDEPENDENT_AMBULATORY_CARE_PROVIDER_SITE_OTHER): Payer: Medicare Other | Admitting: Neurology

## 2013-06-05 VITALS — BP 111/60 | HR 80 | Wt 114.0 lb

## 2013-06-05 DIAGNOSIS — I951 Orthostatic hypotension: Secondary | ICD-10-CM

## 2013-06-05 DIAGNOSIS — R413 Other amnesia: Secondary | ICD-10-CM

## 2013-06-05 DIAGNOSIS — G7000 Myasthenia gravis without (acute) exacerbation: Secondary | ICD-10-CM

## 2013-06-05 HISTORY — DX: Orthostatic hypotension: I95.1

## 2013-06-05 MED ORDER — DONEPEZIL HCL 5 MG PO TABS: 5.0000 mg | ORAL_TABLET | Freq: Every day | ORAL | Status: DC

## 2013-06-05 NOTE — Patient Instructions (Signed)
Orthostatic Hypotension °Orthostatic hypotension is a sudden fall in blood pressure. It occurs when a person goes from a sitting or lying position to a standing position. °CAUSES  °· Loss of body fluids (dehydration). °· Medicines that lower blood pressure. °· Sudden changes in posture, such as sudden standing when you have been sitting or lying down. °· Taking too much of your medicine. °SYMPTOMS  °· Lightheadedness or dizziness. °· Fainting or near-fainting. °· A fast heart rate (tachycardia). °· Weakness. °· Feeling tired (fatigue). °DIAGNOSIS  °Your caregiver may find the cause of orthostatic hypotension through: °· A history and/or physical exam. °· Checking your blood pressure. Your caregiver will check your blood pressure when you are: °· Lying down. °· Sitting. °· Standing. °· Tilt table testing. In this test, you are placed on a table that goes from a lying position to a standing position. You will be strapped to the table. This test helps to monitor your blood pressure and heart rate when you are in different positions. °TREATMENT  °· If orthostatic hypotension is caused by your medicines, your caregiver will need to adjust your dosage. Do not stop or adjust your medicine on your own. °· When changing positions, make these changes slowly. This allows your body to adjust to the different position. °· Compression stockings that are worn on your lower legs may be helpful. °· Your caregiver may have you consume extra salt. Do not add extra salt to your diet unless directed by your caregiver. °· Eat frequent, small meals. Avoid sudden standing after eating. °· Avoid hot showers or excessive heat. °· Your caregiver may give you fluids through the vein (intravenous). °· Your caregiver may put you on medicine to help enhance fluid retention. °SEEK IMMEDIATE MEDICAL CARE IF:  °· You faint or have a near-fainting episode. Call your local emergency services (911 in [place] °· You have or develop chest pain. °· You  feel sick to your stomach (nauseous) or vomit. °· You have a loss of feeling or movement in your arms or legs. °· You have difficulty talking, slurred speech, or you are unable to talk. °· You have difficulty thinking or have confused thinking. °MAKE SURE YOU:  °· Understand these instructions. °· Will watch your condition. °· Will get help right away if you are not doing well or get worse. °Document Released: 06/23/2002 Document Revised: 09/25/2011 Document Reviewed: 10/16/2008 °Exit[employer]® Patient Information ©2014 [employer]. ° °
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
## 2013-06-05 NOTE — Telephone Encounter (Signed)
hi well you say that is nothing more you can do for me. but I am just miserable and my stomach hurts and burns all the time. I still have all that gruggling. I don't eat very much but still my belly feels like it is going to burst. wondering if I may need an endonscopy or colonospy. just don't know what to do. thanks for a reply. 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 Last ECL 12/11/07. Please advise. Thanks

## 2013-06-05 NOTE — Telephone Encounter (Signed)
Dr Rhea Belton, Dr Jarold Motto has asked that you see this pt; will you accept her? Thanks.

## 2013-06-05 NOTE — Telephone Encounter (Signed)
I will accept She may need to be seen by APP 1st to facilitate a quicker appt.

## 2013-06-05 NOTE — Telephone Encounter (Signed)
Spoke with pt to inform her Dr Jarold Motto is retiring and another doc will see her, but she will have to see an APP 1st. Pt agreed and will see Willette Cluster, NP on 06/09/13.

## 2013-06-09 ENCOUNTER — Ambulatory Visit: Payer: Medicare Other | Admitting: Nurse Practitioner

## 2013-06-09 ENCOUNTER — Other Ambulatory Visit: Payer: Self-pay | Admitting: Internal Medicine

## 2013-06-11 ENCOUNTER — Other Ambulatory Visit: Payer: Self-pay | Admitting: *Deleted

## 2013-06-11 ENCOUNTER — Other Ambulatory Visit: Payer: Self-pay | Admitting: Internal Medicine

## 2013-06-11 MED ORDER — CLONAZEPAM 1 MG PO TABS: ORAL_TABLET | ORAL | Status: DC

## 2013-06-11 NOTE — Telephone Encounter (Signed)
OK X1 

## 2013-06-11 NOTE — Telephone Encounter (Signed)
Clonazepam refilled per protocol

## 2013-06-11 NOTE — Telephone Encounter (Signed)
clonazePAM (KLONOPIN) 1 MG tablet Last refill: 05/13/2013, #30, 0 refills Last OV: 11/13/2012 Contract on file, High risk

## 2013-06-13 ENCOUNTER — Other Ambulatory Visit: Payer: Self-pay | Admitting: Internal Medicine

## 2013-07-11 ENCOUNTER — Other Ambulatory Visit: Payer: Self-pay | Admitting: Internal Medicine

## 2013-07-11 NOTE — Telephone Encounter (Signed)
OK X1 

## 2013-07-11 NOTE — Telephone Encounter (Signed)
clonazePAM (KLONOPIN) 1 MG tablet Last refill: 06/11/2013 #30, 0 refills Last OV: 11/13/2012 High risk

## 2013-07-18 ENCOUNTER — Encounter: Payer: Self-pay | Admitting: Surgery

## 2013-07-21 ENCOUNTER — Encounter: Payer: Self-pay | Admitting: Surgery

## 2013-07-21 ENCOUNTER — Ambulatory Visit (HOSPITAL_COMMUNITY)
Admission: RE | Admit: 2013-07-21 | Discharge: 2013-07-21 | Disposition: A | Discharge status: Home / Self Care | Payer: Medicare HMO | Source: Ambulatory Visit | Attending: Surgery | Admitting: Surgery

## 2013-07-21 ENCOUNTER — Ambulatory Visit (INDEPENDENT_AMBULATORY_CARE_PROVIDER_SITE_OTHER): Payer: Medicare HMO | Admitting: Surgery

## 2013-07-21 VITALS — BP 131/61 | HR 66 | Ht 64.5 in | Wt 112.5 lb

## 2013-07-21 DIAGNOSIS — K551 Chronic vascular disorders of intestine: Secondary | ICD-10-CM

## 2013-07-21 DIAGNOSIS — I771 Stricture of artery: Secondary | ICD-10-CM

## 2013-07-21 DIAGNOSIS — Z48812 Encounter for surgical aftercare following surgery on the circulatory system: Secondary | ICD-10-CM | POA: Insufficient documentation
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
## 2013-07-22 ENCOUNTER — Other Ambulatory Visit: Payer: Self-pay | Admitting: *Deleted

## 2013-07-22 DIAGNOSIS — Z01818 Encounter for other preprocedural examination: Secondary | ICD-10-CM

## 2013-07-23 ENCOUNTER — Telehealth: Payer: Self-pay | Admitting: *Deleted

## 2013-07-23 ENCOUNTER — Other Ambulatory Visit: Payer: Self-pay | Admitting: *Deleted

## 2013-07-23 ENCOUNTER — Telehealth: Payer: Self-pay

## 2013-07-23 DIAGNOSIS — K551 Chronic vascular disorders of intestine: Secondary | ICD-10-CM

## 2013-07-23 DIAGNOSIS — I10 Essential (primary) hypertension: Secondary | ICD-10-CM

## 2013-07-23 MED ORDER — LISINOPRIL 5 MG PO TABS: 5.0000 mg | ORAL_TABLET | Freq: Two times a day (BID) | ORAL | Status: DC

## 2013-07-23 MED ORDER — SERTRALINE HCL 50 MG PO TABS: 50.0000 mg | ORAL_TABLET | Freq: Every day | ORAL | Status: DC

## 2013-07-23 MED ORDER — LEVOTHYROXINE SODIUM 112 MCG PO TABS: 56.0000 ug | ORAL_TABLET | Freq: Every day | ORAL | Status: DC

## 2013-07-23 MED ORDER — PRAVASTATIN SODIUM 40 MG PO TABS: 40.0000 mg | ORAL_TABLET | Freq: Every day | ORAL | Status: DC

## 2013-07-23 MED ORDER — CLOPIDOGREL BISULFATE 75 MG PO TABS: 75.0000 mg | ORAL_TABLET | Freq: Every day | ORAL | Status: DC

## 2013-07-23 NOTE — Telephone Encounter (Signed)
Refills sent. JG//CMA

## 2013-07-23 NOTE — Telephone Encounter (Signed)
Request from pt. To send Rx to refill Plavix to "[facility]" with [facility].  Will send refill request to pt's pharmacy.

## 2013-07-23 NOTE — Telephone Encounter (Signed)
Patient called and requested refills to her new Pharmacy [facility]  1. Sertraline (xoloft) 50mg  tablets 2. Pravastatin (Pravachol) 40mg  tablets 3. Lisinopril (Prinivil,zestril) 5mg  tab 4. Levothyroxine (Synthroid Levothroid) 112 mcg

## 2013-07-24 ENCOUNTER — Other Ambulatory Visit: Payer: Self-pay

## 2013-07-24 MED ORDER — DONEPEZIL HCL 5 MG PO TABS: 5.0000 mg | ORAL_TABLET | Freq: Every day | ORAL | Status: DC

## 2013-07-24 NOTE — Telephone Encounter (Signed)
Called stating patent has changed ins companies and needs Aricept refill sent to [place].

## 2013-07-28 ENCOUNTER — Encounter (HOSPITAL_COMMUNITY): Payer: Self-pay | Admitting: Pharmacy Technician

## 2013-07-30 ENCOUNTER — Telehealth: Payer: Self-pay | Admitting: Internal Medicine

## 2013-07-30 NOTE — Telephone Encounter (Signed)
Received authorization from [facility] for patient to see Dr. Kendal Hymen # [id number]; approved 4 visits; expires 11/03/13

## 2013-08-05 ENCOUNTER — Ambulatory Visit (HOSPITAL_COMMUNITY)
Admission: RE | Admit: 2013-08-05 | Discharge: 2013-08-05 | Disposition: A | Discharge status: Home / Self Care | Payer: Medicare HMO | Source: Ambulatory Visit | Attending: Surgery | Admitting: Surgery

## 2013-08-05 ENCOUNTER — Telehealth: Payer: Self-pay | Admitting: Surgery

## 2013-08-05 ENCOUNTER — Other Ambulatory Visit: Payer: Self-pay | Admitting: *Deleted

## 2013-08-05 ENCOUNTER — Encounter (HOSPITAL_COMMUNITY)
Admission: RE | Disposition: A | Discharge status: Home / Self Care | Payer: Self-pay | Source: Ambulatory Visit | Attending: Surgery

## 2013-08-05 DIAGNOSIS — I2510 Atherosclerotic heart disease of native coronary artery without angina pectoris: Secondary | ICD-10-CM | POA: Insufficient documentation

## 2013-08-05 DIAGNOSIS — E039 Hypothyroidism, unspecified: Secondary | ICD-10-CM | POA: Insufficient documentation

## 2013-08-05 DIAGNOSIS — Z7902 Long term (current) use of antithrombotics/antiplatelets: Secondary | ICD-10-CM | POA: Insufficient documentation

## 2013-08-05 DIAGNOSIS — Z01818 Encounter for other preprocedural examination: Secondary | ICD-10-CM

## 2013-08-05 DIAGNOSIS — G7000 Myasthenia gravis without (acute) exacerbation: Secondary | ICD-10-CM | POA: Insufficient documentation

## 2013-08-05 DIAGNOSIS — J449 Chronic obstructive pulmonary disease, unspecified: Secondary | ICD-10-CM | POA: Insufficient documentation

## 2013-08-05 DIAGNOSIS — I771 Stricture of artery: Principal | ICD-10-CM

## 2013-08-05 DIAGNOSIS — F17200 Nicotine dependence, unspecified, uncomplicated: Secondary | ICD-10-CM | POA: Insufficient documentation

## 2013-08-05 DIAGNOSIS — Y831 Surgical operation with implant of artificial internal device as the cause of abnormal reaction of the patient, or of later complication, without mention of misadventure at the time of the procedure: Secondary | ICD-10-CM | POA: Insufficient documentation

## 2013-08-05 DIAGNOSIS — K551 Chronic vascular disorders of intestine: Secondary | ICD-10-CM

## 2013-08-05 DIAGNOSIS — E785 Hyperlipidemia, unspecified: Secondary | ICD-10-CM | POA: Insufficient documentation

## 2013-08-05 DIAGNOSIS — T82898A Other specified complication of vascular prosthetic devices, implants and grafts, initial encounter: Secondary | ICD-10-CM | POA: Insufficient documentation

## 2013-08-05 DIAGNOSIS — I10 Essential (primary) hypertension: Secondary | ICD-10-CM | POA: Insufficient documentation

## 2013-08-05 DIAGNOSIS — K219 Gastro-esophageal reflux disease without esophagitis: Secondary | ICD-10-CM | POA: Insufficient documentation

## 2013-08-05 DIAGNOSIS — Z7982 Long term (current) use of aspirin: Secondary | ICD-10-CM | POA: Insufficient documentation

## 2013-08-05 DIAGNOSIS — J4489 Other specified chronic obstructive pulmonary disease: Secondary | ICD-10-CM | POA: Insufficient documentation

## 2013-08-05 HISTORY — PX: VISCERAL ANGIOGRAM: SHX5515

## 2013-08-05 LAB — POCT I-STAT, CHEM 8
BUN: 14 mg/dL (ref 6–23)
Calcium, Ion: 1.22 mmol/L (ref 1.13–1.30)
Chloride: 102 mEq/L (ref 96–112)
Creatinine, Ser: 0.9 mg/dL (ref 0.50–1.10)
Glucose, Bld: 85 mg/dL (ref 70–99)
HCT: 39 % (ref 36.0–46.0)
Hemoglobin: 13.3 g/dL (ref 12.0–15.0)
Potassium: 4.3 mEq/L (ref 3.7–5.3)
Sodium: 142 mEq/L (ref 137–147)
TCO2: 29 mmol/L (ref 0–100)

## 2013-08-05 LAB — POCT ACTIVATED CLOTTING TIME
Activated Clotting Time: 199 seconds
Activated Clotting Time: 171 seconds
Activated Clotting Time: 182 seconds

## 2013-08-05 SURGERY — VISCERAL ANGIOGRAM
Anesthesia: LOCAL

## 2013-08-05 MED ORDER — MIDAZOLAM HCL 2 MG/2ML IJ SOLN
INTRAMUSCULAR | Status: AC
  Filled 2013-08-05: qty 2

## 2013-08-05 MED ORDER — ONDANSETRON HCL 4 MG/2ML IJ SOLN: 4.0000 mg | Freq: Four times a day (QID) | INTRAMUSCULAR | Status: DC | PRN

## 2013-08-05 MED ORDER — SODIUM CHLORIDE 0.9 % IV SOLN
INTRAVENOUS | Status: DC
  Administered 2013-08-05: 06:00:00 via INTRAVENOUS

## 2013-08-05 MED ORDER — FENTANYL CITRATE 0.05 MG/ML IJ SOLN
INTRAMUSCULAR | Status: AC
  Filled 2013-08-05: qty 2

## 2013-08-05 MED ORDER — LIDOCAINE HCL (PF) 1 % IJ SOLN
INTRAMUSCULAR | Status: AC
  Filled 2013-08-05: qty 30

## 2013-08-05 MED ORDER — ALUM & MAG HYDROXIDE-SIMETH 200-200-20 MG/5ML PO SUSP
15.0000 mL | ORAL | Status: DC | PRN
  Filled 2013-08-05: qty 30

## 2013-08-05 MED ORDER — SODIUM CHLORIDE 0.9 % IV SOLN: 1.0000 mL/kg/h | INTRAVENOUS | Status: DC

## 2013-08-05 MED ORDER — ACETAMINOPHEN 325 MG PO TABS
325.0000 mg | ORAL_TABLET | ORAL | Status: DC | PRN
  Filled 2013-08-05: qty 2

## 2013-08-05 MED ORDER — HEPARIN SODIUM (PORCINE) 1000 UNIT/ML IJ SOLN
INTRAMUSCULAR | Status: AC
Start: 2013-08-05 — End: 2013-08-05
  Filled 2013-08-05: qty 1

## 2013-08-05 MED ORDER — GUAIFENESIN-DM 100-10 MG/5ML PO SYRP
15.0000 mL | ORAL_SOLUTION | ORAL | Status: DC | PRN
  Filled 2013-08-05: qty 15

## 2013-08-05 MED ORDER — ACETAMINOPHEN 325 MG RE SUPP
325.0000 mg | RECTAL | Status: DC | PRN
  Filled 2013-08-05: qty 2

## 2013-08-05 MED ORDER — LABETALOL HCL 5 MG/ML IV SOLN
10.0000 mg | INTRAVENOUS | Status: DC | PRN
Start: 2013-08-05 — End: 2013-08-05

## 2013-08-05 MED ORDER — METOPROLOL TARTRATE 1 MG/ML IV SOLN: 2.0000 mg | INTRAVENOUS | Status: DC | PRN

## 2013-08-05 MED ORDER — PHENOL 1.4 % MT LIQD
1.0000 | OROMUCOSAL | Status: DC | PRN
  Filled 2013-08-05: qty 177

## 2013-08-05 MED ORDER — MORPHINE SULFATE 10 MG/ML IJ SOLN: 2.0000 mg | INTRAMUSCULAR | Status: DC | PRN

## 2013-08-05 MED ORDER — HEPARIN (PORCINE) IN NACL 2-0.9 UNIT/ML-% IJ SOLN
INTRAMUSCULAR | Status: AC
  Filled 2013-08-05: qty 1000

## 2013-08-05 MED ORDER — HYDRALAZINE HCL 20 MG/ML IJ SOLN
10.0000 mg | INTRAMUSCULAR | Status: DC | PRN
Start: 2013-08-05 — End: 2013-08-05

## 2013-08-05 MED ORDER — TRAMADOL HCL 50 MG PO TABS: 50.0000 mg | ORAL_TABLET | Freq: Four times a day (QID) | ORAL | Status: DC | PRN
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
## 2013-08-05 NOTE — Discharge Instructions (Signed)
Angiography, Care After Refer to this sheet in the next few weeks. These instructions provide you with information on caring for yourself after your procedure. Your health care provider may also give you more specific instructions. Your treatment has been planned according to current medical practices, but problems sometimes occur. Call your health care provider if you have any problems or questions after your procedure.  WHAT TO EXPECT AFTER THE PROCEDURE After your procedure, it is typical to have the following sensations:  Minor discomfort or tenderness and a small bump at the catheter insertion site. The bump should usually decrease in size and tenderness within 1 to 2 weeks.  Any bruising will usually fade within 2 to 4 weeks. HOME CARE INSTRUCTIONS   You may need to keep taking blood thinners if they were prescribed for you. Only take over-the-counter or prescription medicines for pain, fever, or discomfort as directed by your health care provider.  Do not apply powder or lotion to the site.  Do not sit in a bathtub, swimming pool, or whirlpool for 5 to 7 days.  Inspect the site at least twice daily.  Limit your activity for the first 24 hours. Do not bend, squat, or lift anything over 10 lb (9 kg) or as directed by your health care provider.  Do not drive home if you are discharged the day of the procedure. Have someone else drive you. Follow instructions about when you can drive or return to work. SEEK MEDICAL CARE IF:  You get lightheaded when standing up.  You have drainage (other than a small amount of blood on the dressing).  You have chills.  You have a fever.  You have redness, warmth, swelling, or pain at the insertion site. SEEK IMMEDIATE MEDICAL CARE IF:   You develop chest pain or shortness of breath, feel faint, or pass out.  You have bleeding, swelling larger than a walnut, or drainage from the catheter insertion site.  You develop pain, discoloration,  coldness, or severe bruising in the leg or arm that held the catheter.  You develop bleeding from any other place, such as the bowels. You may see bright red blood in your urine or stools, or your stools may appear black and tarry.  You have heavy bleeding from the site. If this happens, hold pressure on the site. MAKE SURE YOU:  Understand these instructions.  Will watch your condition.  Will get help right away if you are not doing well or get worse. Document Released: 01/19/2005 Document Revised: 03/05/2013 Document Reviewed: 11/25/2012 [facility] Patient Information 2014 [place].

## 2013-08-05 NOTE — Interval H&P Note (Signed)
History and Physical Interval Note:  08/05/2013 7:20 AM  Brandi Raymond  has presented today for surgery, with the diagnosis of PVD  The various methods of treatment have been discussed with the patient and family. After consideration of risks, benefits and other options for treatment, the patient has consented to  Procedure(s): MESENTERIC ANGIOGRAM (N/A) as a surgical intervention .  The patient's history has been reviewed, patient examined, no change in status, stable for surgery.  I have reviewed the patient's chart and labs.  Questions were answered to the patient's satisfaction.     BRABHAM IV, V. WELLS

## 2013-08-05 NOTE — Telephone Encounter (Addendum)
Message copied by Gena Fray on Tue Aug 05, 2013 11:16 AM ------      Message from: Mena Goes      Created: Tue Aug 05, 2013  9:07 AM      Regarding: schedule                   ----- Message -----         From: Serafina Mitchell, MD         Sent: 08/05/2013   8:56 AM           To: Genia Del, CMA, #            08/05/2012:                  Surgeon:  Eldridge Abrahams      Procedure Performed:       1.  ultrasound access, right femoral artery       2.  abdominal aortogram       3.  first order catheterization (superior mesenteric artery)       4.  superior mesenteric artery angiogram       5.  angioplasty, superior mesenteric artery                  Please schedule followup in 6 months with an abdominal ultrasound. ------  08/05/13: left message for pts daughter [name] at [phone] (cell) regarding appt on 02/09/14 @ 9:00am, dpm

## 2013-08-05 NOTE — Op Note (Signed)
    Patient name: Brandi Raymond MRN: [id number] DOB: 26-Sep-1933 Sex: female  08/05/2013 Pre-operative Diagnosis: in-stent SMA stenosis Post-operative diagnosis:  Same Surgeon:  Eldridge Abrahams Procedure Performed:  1.  ultrasound access, right femoral artery  2.  abdominal aortogram  3.  first order catheterization (superior mesenteric artery)  4.  superior mesenteric artery angiogram  5.  angioplasty, superior mesenteric artery   Indications:  The patient is previously undergone stenting of her superior mesenteric artery from the right brachial approach.  Ultrasound identified elevated velocities within her stent.  She comes in for further evaluation.  Procedure:  The patient was identified in the holding area and taken to room 8.  The patient was then placed supine on the table and prepped and draped in the usual sterile fashion.  A time out was called.  Ultrasound was used to evaluate the right common femoral artery.  It was patent .  A digital ultrasound image was acquired.  A micropuncture needle was used to access the right common femoral artery under ultrasound guidance.  An 018 wire was advanced without resistance and a micropuncture sheath was placed.  The 018 wire was removed and a benson wire was placed.  The micropuncture sheath was exchanged for a 5 french sheath.  An omniflush catheter was advanced over the wire to the level of L-1.  An abdominal angiogram was obtained in the AP and lateral projection.  Next using a SOS catheter, the superior mesenteric artery stent was cannulated and a superior mesenteric artery antrum was performed.  Findings:   Aortogram:  No significant aortic stenosis is identified.  Not aneurysmal changes within the infrarenal aorta.  No significant renal artery stenosis.  Stenosis noted at the celiac artery.  The superior mesenteric artery stent appears patent.  Superior mesenteric artery:  Approximately 70% stenosis is identified within the superior  mesenteric artery stent.  The distal branches of the artery are widely patent   Intervention:  After the above images were obtained, the decision was made to proceed with intervention.  A 6 French 45 cm sheath was placed.  The patient was fully heparinized.  I then attempted to cannulate the superior mesenteric artery stent with a sauce catheter.  I was unable to get good purchase with a wire to proceed with intervention.  Ultimately a IM catheter was able to select the superior mesenteric artery stent and I advanced a 014 stabilizer wire into the distal superior mesenteric artery.  Over the wire and a 5 x 15 balloon was placed and used to dilate the stent, taking the balloon to rated pressure.  Completion angiogram revealed improvement of the stenosis to less than 20%.  Impression:  #1  successful balloon angioplasty of in-stent stenosis within the superior mesenteric artery using a 5 mm balloon     V. Annamarie Major, M.D. [facility] [place] Office: [phone] Pager:  [phone]

## 2013-08-05 NOTE — Progress Notes (Signed)
Assumed care of pt from Anette Guarneri, RN.  Report received.  Assessment documented.

## 2013-08-07 ENCOUNTER — Encounter: Payer: Self-pay | Admitting: Internal Medicine

## 2013-08-09 ENCOUNTER — Other Ambulatory Visit: Payer: Self-pay | Admitting: Internal Medicine

## 2013-08-11 NOTE — Telephone Encounter (Signed)
Script faxed to pharmacy. JG//CMA

## 2013-08-11 NOTE — Telephone Encounter (Signed)
OK X1 

## 2013-08-11 NOTE — Telephone Encounter (Signed)
clonazePAM (KLONOPIN) 1 MG tablet Last refill: 07/11/13 #30, 0 refill Last OV: 11/13/12 UDS up-to-date, low risk

## 2013-08-13 ENCOUNTER — Other Ambulatory Visit: Payer: Self-pay | Admitting: Internal Medicine

## 2013-08-16 ENCOUNTER — Other Ambulatory Visit: Payer: Self-pay | Admitting: Neurology

## 2013-08-20 ENCOUNTER — Encounter: Payer: Medicare HMO | Admitting: Internal Medicine

## 2013-08-26 ENCOUNTER — Telehealth: Payer: Self-pay

## 2013-08-26 NOTE — Telephone Encounter (Signed)
The patient called and is needing a referral to [facility] (Dr.Bates) due her [facility] [phone]

## 2013-08-28 ENCOUNTER — Ambulatory Visit: Payer: Medicare HMO | Admitting: Gastroenterology

## 2013-08-28 ENCOUNTER — Other Ambulatory Visit: Payer: Self-pay | Admitting: Internal Medicine

## 2013-08-28 ENCOUNTER — Encounter: Payer: Self-pay | Admitting: Internal Medicine

## 2013-08-28 ENCOUNTER — Telehealth: Payer: Self-pay

## 2013-08-28 ENCOUNTER — Ambulatory Visit (INDEPENDENT_AMBULATORY_CARE_PROVIDER_SITE_OTHER): Payer: Medicare HMO | Admitting: Internal Medicine

## 2013-08-28 VITALS — BP 94/58 | HR 96 | Temp 97.2°F | Resp 12 | Ht 64.0 in | Wt 112.0 lb

## 2013-08-28 DIAGNOSIS — H60502 Unspecified acute noninfective otitis externa, left ear: Secondary | ICD-10-CM

## 2013-08-28 DIAGNOSIS — H60399 Other infective otitis externa, unspecified ear: Secondary | ICD-10-CM

## 2013-08-28 MED ORDER — CIPROFLOXACIN-HYDROCORTISONE 0.2-1 % OT SUSP: 3.0000 [drp] | Freq: Two times a day (BID) | OTIC | Status: DC

## 2013-08-28 MED ORDER — NEOMYCIN-POLYMYXIN-HC 3.5-10000-1 OT SOLN: 4.0000 [drp] | Freq: Four times a day (QID) | OTIC | Status: DC

## 2013-08-28 NOTE — Progress Notes (Signed)
   Subjective:    Patient ID: Brandi Raymond, female    DOB: 1933/10/12, 80 y.o.   MRN: [id number]  HPI   Symptoms began 07/24/13 is drainage from the left ear which is described as green and puslike. This has improved somewhat. She's been trying to make an appointment with Dr. Tana Conch  she was seen in the past. He treated her for similar problem in the past. Apparently he is not in her network.  She's had associated hearing loss on that side without associated tinnitus.  PMH of TM replacement on L remotely    Review of Systems  She denies fever, chills, or sweats.  She is not having pain in the frontal sinus or facial sinus area, dental pain, or nasal purulence.     Objective:   Physical Exam General appearance:thin but in good health ;well nourished; no acute distress or increased work of breathing is present.  No  lymphadenopathy about the head, neck, or axilla noted.   Eyes: No conjunctival inflammation or lid edema is present.  Ears:  External ear exam shows no significant lesions or deformities.  Otoscopic examination reveals frankly purulent discharge on L. R TM dull  Nose:  External nasal examination shows no deformity or inflammation. Nasal mucosa are pink and moist without lesions or exudates. No septal dislocation or deviation.No obstruction to airflow.   Oral exam: Dentures; lips and gums are healthy appearing.There is no oropharyngeal erythema or exudate noted.   Neck:  No deformities,  masses, or tenderness noted.   Supple with full range of motion without pain.   Heart:  Normal rate and regular rhythm. S1 and S2 normal without gallop, murmur, click, rub or other extra sounds. S4  Lungs:Chest clear to auscultation; no wheezes, rhonchi,rales ,or rubs present.No increased work of breathing.  Decreased  Extremities:  No cyanosis, edema, or clubbing  noted    Skin: Warm & dry          Assessment & Plan:  #1 purulent otitis externa See orders

## 2013-08-28 NOTE — Telephone Encounter (Signed)
Please Advise

## 2013-08-28 NOTE — Patient Instructions (Signed)
I recommend an  ENT consultation to determine optimal therapy.

## 2013-08-28 NOTE — Telephone Encounter (Signed)
The patient called and is hoping to have a different antibiotic rx sent in, she states the pharmacy does not have the meds, and it is too expensive.   Thanks!

## 2013-08-28 NOTE — Progress Notes (Signed)
Pre visit review using our clinic review tool, if applicable. No additional management support is needed unless otherwise documented below in the visit note/SLS  

## 2013-09-06 ENCOUNTER — Other Ambulatory Visit: Payer: Self-pay | Admitting: Internal Medicine

## 2013-09-09 NOTE — Telephone Encounter (Signed)
Requesting Clonazepam 1mg  Take 1/2-1 tablet at bedtime as needed for anxiety. Last refill:08-09-13:#30 Last OV:08-28-13 UDS:04-14-13-Low risk Please advise.//AB/CMA

## 2013-09-09 NOTE — Telephone Encounter (Signed)
OK X1 

## 2013-09-10 ENCOUNTER — Other Ambulatory Visit: Payer: Self-pay | Admitting: Internal Medicine

## 2013-09-10 NOTE — Telephone Encounter (Signed)
Rx was refused it was already faxed to the pharmacy.//AB/CMA

## 2013-09-10 NOTE — Telephone Encounter (Signed)
Rx printed and faxed to the pharmacy.//AB/CMA 

## 2013-09-15 ENCOUNTER — Ambulatory Visit (INDEPENDENT_AMBULATORY_CARE_PROVIDER_SITE_OTHER): Payer: Medicare HMO | Admitting: Gastroenterology

## 2013-09-15 ENCOUNTER — Encounter: Payer: Self-pay | Admitting: Gastroenterology

## 2013-09-15 VITALS — BP 110/60 | HR 80 | Ht 64.0 in | Wt 112.2 lb

## 2013-09-15 DIAGNOSIS — K589 Irritable bowel syndrome without diarrhea: Secondary | ICD-10-CM

## 2013-09-15 DIAGNOSIS — R142 Eructation: Secondary | ICD-10-CM

## 2013-09-15 DIAGNOSIS — K559 Vascular disorder of intestine, unspecified: Secondary | ICD-10-CM

## 2013-09-15 DIAGNOSIS — R141 Gas pain: Secondary | ICD-10-CM

## 2013-09-15 DIAGNOSIS — R109 Unspecified abdominal pain: Secondary | ICD-10-CM

## 2013-09-15 DIAGNOSIS — R140 Abdominal distension (gaseous): Secondary | ICD-10-CM

## 2013-09-15 DIAGNOSIS — R143 Flatulence: Secondary | ICD-10-CM

## 2013-09-15 MED ORDER — GLYCOPYRROLATE 1 MG PO TABS: 1.0000 mg | ORAL_TABLET | Freq: Two times a day (BID) | ORAL | Status: DC

## 2013-09-15 NOTE — Progress Notes (Addendum)
    History of Present Illness: This is a 80 year old female accompanied by her daughter. She complains of abdominal bloating, gurgling, fullness, mild constipation. 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 She's been followed by Dr. Verl Blalock for years. She underwent colonoscopies in 2001 2006 and 2009 showing diverticulosis in the left colon and melanosis coli. Prior endoscopy 2009 showed GERD, hiatal hernia and gastritis. She has a long history irritable bowel syndrome with occasional constipation. She states she has had severe cramping with certain laxatives leading to syncope. She had a prior SMA stent placed in which ahd become more stenotic over time. Her SMA in-stent stenosis was dilated by Dr. Trula Slade in January. Denies weight loss, diarrhea, change in stool caliber, melena, hematochezia, nausea, vomiting, dysphagia, reflux symptoms, chest pain.  Current Medications, Allergies, Past Medical History, Past Surgical History, Family History and Social History were reviewed in [employer] record.  Physical Exam: General: Well developed , well nourished, elderly, no acute distress Head: Normocephalic and atraumatic Eyes:  sclerae anicteric, EOMI Ears: Normal auditory acuity Mouth: No deformity or lesions Lungs: Clear throughout to auscultation Heart: Regular rate and rhythm; no murmurs, rubs or bruits Abdomen: Soft, mild diffuse tenderness and non distended. No masses, hepatosplenomegaly or hernias noted. Normal Bowel sounds Musculoskeletal: Symmetrical with no gross deformities  Pulses:  Normal pulses noted Extremities: No clubbing, cyanosis, edema or deformities noted Neurological: Alert oriented x 4, grossly nonfocal Psychological:  Alert and cooperative. Normal mood and affect  Assessment and Recommendations:  1. Chronic mesenteric ischemia with known celiac artery and SMA stenoses followed by Dr.  Trula Slade. This likely contributing to or could be the entire cause of her current symptoms. Small frequent meals. Ongoing followup with Dr. Trula Slade.  2. Irritable bowel syndrome, abdominal bloating, mild constipation. Given that she's had syncopal episodes from laxatives will begin with daily stool softeners. If this is not effective proceed with a trial of MiraLax once or twice daily. Begin glycopyrrolate 1 mg twice a day.  3. History of GERD and gastritis. No active symptoms.  4. Given her age and comorbidities no plans for future screening or surveillance colonoscopies. She has undergone colonoscopies in 2001, 2006 and 2009 with no precancerous colon polyps found.

## 2013-09-15 NOTE — Patient Instructions (Signed)
We have sent the following medications to your pharmacy for you to pick up at your convenience: Robinul.  Start taking a stool softener daily.   Thank you for choosing me and [facility].  Pricilla Riffle. Dagoberto Ligas., MD., Marval Regal

## 2013-09-26 ENCOUNTER — Encounter: Payer: Self-pay | Admitting: Internal Medicine

## 2013-09-26 ENCOUNTER — Ambulatory Visit (INDEPENDENT_AMBULATORY_CARE_PROVIDER_SITE_OTHER): Payer: Medicare HMO | Admitting: Internal Medicine

## 2013-09-26 VITALS — BP 130/50 | HR 64 | Temp 97.7°F | Resp 16 | Ht 65.25 in | Wt 114.6 lb

## 2013-09-26 DIAGNOSIS — E039 Hypothyroidism, unspecified: Secondary | ICD-10-CM

## 2013-09-26 DIAGNOSIS — E559 Vitamin D deficiency, unspecified: Secondary | ICD-10-CM

## 2013-09-26 DIAGNOSIS — Z23 Encounter for immunization: Secondary | ICD-10-CM

## 2013-09-26 DIAGNOSIS — Z0000 Encounter for general adult medical examination without abnormal findings: Secondary | ICD-10-CM

## 2013-09-26 DIAGNOSIS — E782 Mixed hyperlipidemia: Secondary | ICD-10-CM

## 2013-09-26 DIAGNOSIS — I10 Essential (primary) hypertension: Secondary | ICD-10-CM

## 2013-09-26 DIAGNOSIS — D649 Anemia, unspecified: Secondary | ICD-10-CM

## 2013-09-26 DIAGNOSIS — R7309 Other abnormal glucose: Secondary | ICD-10-CM
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
## 2013-09-26 NOTE — Progress Notes (Signed)
Pre visit review using our clinic review tool, if applicable. No additional management support is needed unless otherwise documented below in the visit note. 

## 2013-09-26 NOTE — Patient Instructions (Signed)
Your next office appointment will be determined based upon review of your pending labs. Those instructions will be transmitted to you through My Chart . 

## 2013-09-29 ENCOUNTER — Telehealth: Payer: Self-pay | Admitting: Internal Medicine

## 2013-09-29 NOTE — Telephone Encounter (Signed)
Relevant patient education assigned to patient using Emmi. ° °

## 2013-10-01 ENCOUNTER — Other Ambulatory Visit (INDEPENDENT_AMBULATORY_CARE_PROVIDER_SITE_OTHER): Payer: Medicare HMO

## 2013-10-01 DIAGNOSIS — I10 Essential (primary) hypertension: Secondary | ICD-10-CM

## 2013-10-01 DIAGNOSIS — R7309 Other abnormal glucose: Secondary | ICD-10-CM

## 2013-10-01 DIAGNOSIS — E039 Hypothyroidism, unspecified: Secondary | ICD-10-CM

## 2013-10-01 DIAGNOSIS — D649 Anemia, unspecified: Secondary | ICD-10-CM

## 2013-10-01 DIAGNOSIS — E559 Vitamin D deficiency, unspecified: Secondary | ICD-10-CM

## 2013-10-01 DIAGNOSIS — E782 Mixed hyperlipidemia: Secondary | ICD-10-CM

## 2013-10-01 LAB — CBC WITH DIFFERENTIAL/PLATELET
Basophils Absolute: 0 10*3/uL (ref 0.0–0.1)
Basophils Relative: 0.4 % (ref 0.0–3.0)
Eosinophils Absolute: 0.2 10*3/uL (ref 0.0–0.7)
Eosinophils Relative: 4.1 % (ref 0.0–5.0)
HCT: 39.8 % (ref 36.0–46.0)
Hemoglobin: 13 g/dL (ref 12.0–15.0)
Lymphocytes Relative: 35.5 % (ref 12.0–46.0)
Lymphs Abs: 1.9 10*3/uL (ref 0.7–4.0)
MCHC: 32.6 g/dL (ref 30.0–36.0)
MCV: 89.4 fl (ref 78.0–100.0)
Monocytes Absolute: 0.6 10*3/uL (ref 0.1–1.0)
Monocytes Relative: 10.7 % (ref 3.0–12.0)
Neutro Abs: 2.7 10*3/uL (ref 1.4–7.7)
Neutrophils Relative %: 49.3 % (ref 43.0–77.0)
Platelets: 151 10*3/uL (ref 150.0–400.0)
RBC: 4.45 Mil/uL (ref 3.87–5.11)
RDW: 13.5 % (ref 11.5–14.6)
WBC: 5.4 10*3/uL (ref 4.5–10.5)

## 2013-10-01 LAB — HEPATIC FUNCTION PANEL
ALT: 14 U/L (ref 0–35)
AST: 28 U/L (ref 0–37)
Albumin: 3.9 g/dL (ref 3.5–5.2)
Alkaline Phosphatase: 57 U/L (ref 39–117)
Bilirubin, Direct: 0.1 mg/dL (ref 0.0–0.3)
Total Bilirubin: 0.6 mg/dL (ref 0.3–1.2)
Total Protein: 6.8 g/dL (ref 6.0–8.3)

## 2013-10-01 LAB — LIPID PANEL
Cholesterol: 208 mg/dL — ABNORMAL HIGH (ref 0–200)
HDL: 84.5 mg/dL (ref >39.00)
LDL Cholesterol: 106 mg/dL — ABNORMAL HIGH (ref 0–99)
Total CHOL/HDL Ratio: 2
Triglycerides: 89 mg/dL (ref 0.0–149.0)
VLDL: 17.8 mg/dL (ref 0.0–40.0)

## 2013-10-01 LAB — BASIC METABOLIC PANEL
BUN: 12 mg/dL (ref 6–23)
CO2: 28 mEq/L (ref 19–32)
Calcium: 8.9 mg/dL (ref 8.4–10.5)
Chloride: 103 mEq/L (ref 96–112)
Creatinine, Ser: 0.9 mg/dL (ref 0.4–1.2)
GFR: 63.52 mL/min (ref >60.00)
Glucose, Bld: 90 mg/dL (ref 70–99)
Potassium: 4.8 mEq/L (ref 3.5–5.1)
Sodium: 140 mEq/L (ref 135–145)

## 2013-10-01 LAB — TSH: TSH: 3.98 u[IU]/mL (ref 0.35–5.50)

## 2013-10-01 LAB — HEMOGLOBIN A1C: Hgb A1c MFr Bld: 5.8 % (ref 4.6–6.5)

## 2013-10-05 LAB — VITAMIN D 1,25 DIHYDROXY
Vitamin D 1, 25 (OH)2 Total: 35 pg/mL (ref 18–72)
Vitamin D2 1, 25 (OH)2: <8 pg/mL
Vitamin D3 1, 25 (OH)2: 35 pg/mL

## 2013-10-11 ENCOUNTER — Other Ambulatory Visit: Payer: Self-pay | Admitting: Internal Medicine

## 2013-10-14 ENCOUNTER — Other Ambulatory Visit: Payer: Self-pay | Admitting: Internal Medicine

## 2013-10-14 NOTE — Telephone Encounter (Signed)
OK X1 

## 2013-10-15 ENCOUNTER — Other Ambulatory Visit: Payer: Self-pay | Admitting: Internal Medicine

## 2013-10-23 ENCOUNTER — Other Ambulatory Visit: Payer: Self-pay

## 2013-10-28 ENCOUNTER — Encounter: Payer: Self-pay | Admitting: Internal Medicine

## 2013-11-10 ENCOUNTER — Other Ambulatory Visit: Payer: Self-pay

## 2013-11-10 MED ORDER — CLONAZEPAM 1 MG PO TABS: ORAL_TABLET | ORAL | Status: DC

## 2013-11-10 NOTE — Telephone Encounter (Signed)
OK X1 

## 2013-11-10 NOTE — Telephone Encounter (Signed)
Med last filled on 10/11/2013 #30 no refills  Pt last seen 09/26/13

## 2013-11-17 ENCOUNTER — Other Ambulatory Visit: Payer: Self-pay

## 2013-11-17 MED ORDER — DONEPEZIL HCL 5 MG PO TABS: 5.0000 mg | ORAL_TABLET | Freq: Every day | ORAL | Status: DC

## 2013-11-18 ENCOUNTER — Ambulatory Visit (HOSPITAL_COMMUNITY): Payer: Medicare HMO | Attending: Cardiology | Admitting: Cardiology

## 2013-11-18 ENCOUNTER — Encounter: Payer: Self-pay | Admitting: Cardiology

## 2013-11-18 ENCOUNTER — Ambulatory Visit (INDEPENDENT_AMBULATORY_CARE_PROVIDER_SITE_OTHER): Payer: Medicare HMO | Admitting: Cardiology

## 2013-11-18 VITALS — BP 124/72 | HR 59 | Ht 64.0 in | Wt 117.8 lb

## 2013-11-18 DIAGNOSIS — I779 Disorder of arteries and arterioles, unspecified: Secondary | ICD-10-CM

## 2013-11-18 DIAGNOSIS — I10 Essential (primary) hypertension: Secondary | ICD-10-CM | POA: Insufficient documentation

## 2013-11-18 DIAGNOSIS — F17200 Nicotine dependence, unspecified, uncomplicated: Secondary | ICD-10-CM | POA: Insufficient documentation

## 2013-11-18 DIAGNOSIS — I658 Occlusion and stenosis of other precerebral arteries: Secondary | ICD-10-CM | POA: Insufficient documentation

## 2013-11-18 DIAGNOSIS — I6529 Occlusion and stenosis of unspecified carotid artery: Secondary | ICD-10-CM

## 2013-11-18 DIAGNOSIS — E785 Hyperlipidemia, unspecified: Secondary | ICD-10-CM | POA: Insufficient documentation

## 2013-11-18 DIAGNOSIS — E119 Type 2 diabetes mellitus without complications: Secondary | ICD-10-CM | POA: Insufficient documentation

## 2013-11-18 DIAGNOSIS — R0989 Other specified symptoms and signs involving the circulatory and respiratory systems: Secondary | ICD-10-CM

## 2013-11-18 DIAGNOSIS — I2510 Atherosclerotic heart disease of native coronary artery without angina pectoris: Secondary | ICD-10-CM

## 2013-11-18 DIAGNOSIS — I951 Orthostatic hypotension: Secondary | ICD-10-CM

## 2013-11-18 DIAGNOSIS — I739 Peripheral vascular disease, unspecified: Secondary | ICD-10-CM | POA: Insufficient documentation
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
## 2013-11-18 NOTE — Progress Notes (Signed)
Carotid duplex complete 

## 2013-11-18 NOTE — Patient Instructions (Signed)
The current medical regimen is effective;  continue present plan and medications.  Follow up in 1 year with Dr Hochrein.  You will receive a letter in the mail 2 months before you are due.  Please call us when you receive this letter to schedule your follow up appointment.  

## 2013-12-09 ENCOUNTER — Ambulatory Visit: Payer: Medicare Other | Admitting: Neurology

## 2013-12-09 ENCOUNTER — Telehealth: Payer: Self-pay | Admitting: Neurology

## 2013-12-09 NOTE — Telephone Encounter (Signed)
This patient did not show for a revisit appointment today. 

## 2013-12-10 ENCOUNTER — Other Ambulatory Visit: Payer: Self-pay

## 2013-12-10 MED ORDER — CLONAZEPAM 1 MG PO TABS: ORAL_TABLET | ORAL | Status: DC

## 2013-12-10 NOTE — Telephone Encounter (Signed)
Last office visit 09/26/13 Med last filled 11/10/2013 #30

## 2013-12-10 NOTE — Telephone Encounter (Signed)
OK x 1 

## 2014-01-12 ENCOUNTER — Other Ambulatory Visit: Payer: Self-pay

## 2014-01-12 MED ORDER — CLONAZEPAM 1 MG PO TABS: ORAL_TABLET | ORAL | Status: DC

## 2014-01-12 NOTE — Telephone Encounter (Signed)
OK X1 

## 2014-01-12 NOTE — Telephone Encounter (Signed)
**Note De-Identified  Obfuscation** Medication last filled last month

## 2014-01-13 ENCOUNTER — Other Ambulatory Visit: Payer: Self-pay

## 2014-01-13 MED ORDER — SERTRALINE HCL 50 MG PO TABS: 50.0000 mg | ORAL_TABLET | Freq: Every day | ORAL | Status: DC

## 2014-01-13 NOTE — Telephone Encounter (Signed)
OK X1 

## 2014-02-06 ENCOUNTER — Encounter: Payer: Self-pay | Admitting: Surgery

## 2014-02-09 ENCOUNTER — Ambulatory Visit (HOSPITAL_COMMUNITY)
Admit: 2014-02-09 | Discharge: 2014-02-09 | Disposition: A | Discharge status: Home / Self Care | Payer: Medicare HMO | Attending: Surgery | Admitting: Surgery

## 2014-02-09 ENCOUNTER — Ambulatory Visit: Payer: Commercial Managed Care - HMO | Admitting: Surgery

## 2014-02-09 ENCOUNTER — Other Ambulatory Visit: Payer: Self-pay

## 2014-02-09 DIAGNOSIS — I771 Stricture of artery: Principal | ICD-10-CM

## 2014-02-09 DIAGNOSIS — K551 Chronic vascular disorders of intestine: Secondary | ICD-10-CM

## 2014-02-09 MED ORDER — CLONAZEPAM 1 MG PO TABS: ORAL_TABLET | ORAL | Status: DC

## 2014-02-09 NOTE — Telephone Encounter (Signed)
OK x 1 

## 2014-02-25 ENCOUNTER — Emergency Department (HOSPITAL_COMMUNITY)
Admission: EM | Admit: 2014-02-25 | Discharge: 2014-02-25 | Disposition: A | Discharge status: Home / Self Care | Payer: Medicare HMO | Source: Home / Self Care | Attending: Family Medicine | Admitting: Family Medicine

## 2014-02-25 ENCOUNTER — Encounter (HOSPITAL_COMMUNITY): Payer: Self-pay | Admitting: Emergency Medicine

## 2014-02-25 DIAGNOSIS — Y93E9 Activity, other interior property and clothing maintenance: Secondary | ICD-10-CM

## 2014-02-25 DIAGNOSIS — IMO0002 Reserved for concepts with insufficient information to code with codable children: Secondary | ICD-10-CM

## 2014-02-25 DIAGNOSIS — S81809A Unspecified open wound, unspecified lower leg, initial encounter: Secondary | ICD-10-CM

## 2014-02-25 DIAGNOSIS — Y92009 Unspecified place in unspecified non-institutional (private) residence as the place of occurrence of the external cause: Secondary | ICD-10-CM

## 2014-02-25 DIAGNOSIS — S81812A Laceration without foreign body, left lower leg, initial encounter: Secondary | ICD-10-CM

## 2014-02-25 DIAGNOSIS — S91009A Unspecified open wound, unspecified ankle, initial encounter: Secondary | ICD-10-CM

## 2014-02-25 DIAGNOSIS — S81009A Unspecified open wound, unspecified knee, initial encounter: Secondary | ICD-10-CM

## 2014-02-25 MED ORDER — TETANUS-DIPHTH-ACELL PERTUSSIS 5-2.5-18.5 LF-MCG/0.5 IM SUSP: 0.5000 mL | Freq: Once | INTRAMUSCULAR | Status: DC

## 2014-02-25 NOTE — ED Notes (Signed)
Pt reports laceration to lower left extremity around 1450 today Reports she lost her balance and hit a wooden tv stand On Plavix; bleeding not controlled Last tetanus = unknown Denies head inj/LOC Alert w/no signs of acute distress.

## 2014-02-25 NOTE — Discharge Instructions (Signed)
Care as discussed, return 8/21 for staple removal., sooner if any problems.
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
## 2014-02-27 ENCOUNTER — Encounter: Payer: Self-pay | Admitting: Surgery

## 2014-03-02 ENCOUNTER — Inpatient Hospital Stay (HOSPITAL_COMMUNITY): Admission: RE | Admit: 2014-03-02 | Payer: Commercial Managed Care - HMO | Source: Ambulatory Visit

## 2014-03-02 ENCOUNTER — Ambulatory Visit: Payer: Commercial Managed Care - HMO | Admitting: Surgery

## 2014-03-06 ENCOUNTER — Emergency Department (INDEPENDENT_AMBULATORY_CARE_PROVIDER_SITE_OTHER)
Admission: EM | Admit: 2014-03-06 | Discharge: 2014-03-06 | Disposition: A | Discharge status: Home / Self Care | Payer: Commercial Managed Care - HMO | Source: Home / Self Care | Attending: Emergency Medicine | Admitting: Emergency Medicine

## 2014-03-06 ENCOUNTER — Encounter (HOSPITAL_COMMUNITY): Payer: Self-pay | Admitting: Emergency Medicine

## 2014-03-06 DIAGNOSIS — Z4802 Encounter for removal of sutures: Secondary | ICD-10-CM
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
## 2014-03-06 NOTE — Discharge Instructions (Signed)

## 2014-03-06 NOTE — ED Notes (Signed)
Staple removal from left lower leg.  Pt states area is tender to touch.    Redness at wound site.  Mild swelling.  Still having pain with standing for long periods of time.

## 2014-03-12 ENCOUNTER — Other Ambulatory Visit: Payer: Self-pay

## 2014-03-12 MED ORDER — CLONAZEPAM 1 MG PO TABS: ORAL_TABLET | ORAL | Status: DC

## 2014-03-12 NOTE — Telephone Encounter (Signed)
OK X1 

## 2014-03-20 ENCOUNTER — Other Ambulatory Visit: Payer: Self-pay

## 2014-03-20 MED ORDER — LEVOTHYROXINE SODIUM 112 MCG PO TABS: 56.0000 ug | ORAL_TABLET | Freq: Every day | ORAL | Status: DC

## 2014-04-03 ENCOUNTER — Encounter: Payer: Self-pay | Admitting: Family

## 2014-04-06 ENCOUNTER — Ambulatory Visit (HOSPITAL_COMMUNITY)
Admission: RE | Admit: 2014-04-06 | Discharge: 2014-04-06 | Disposition: A | Discharge status: Home / Self Care | Payer: Medicare HMO | Source: Ambulatory Visit | Attending: Family | Admitting: Family

## 2014-04-06 ENCOUNTER — Ambulatory Visit (INDEPENDENT_AMBULATORY_CARE_PROVIDER_SITE_OTHER): Payer: Commercial Managed Care - HMO | Admitting: Family

## 2014-04-06 ENCOUNTER — Encounter: Payer: Self-pay | Admitting: Family

## 2014-04-06 VITALS — BP 131/73 | HR 74 | Resp 16 | Ht 65.5 in | Wt 122.0 lb

## 2014-04-06 DIAGNOSIS — K551 Chronic vascular disorders of intestine: Secondary | ICD-10-CM

## 2014-04-06 NOTE — Patient Instructions (Signed)
Chronic Mesenteric Ischemia Mesenteric ischemia is a deficiency of blood in an area of the intestine supplied by an artery that supports the intestine. Chronic mesenteric ischemia, also called intestinal angina, is a long-term condition. It happens when an artery or vein that supports the intestine gradually becomes blocked or narrow, restricting the blood supply to the intestine. When the blood supply to the intestine is severely restricted, the intestines cannot function properly because needed oxygen cannot reach them.  CAUSES   Fatty deposits that build up in an artery or vein but have not yet restricted blood flow entirely.  Differences in some people's anatomy.  Rapid weight loss.  Weakened areas in blood vessel walls (aneurysms).  Swelling and inflammation of blood vessels (such as from fibromuscular dysplasia and arteritis).  Disorders of blood clotting.  Scarring and fibrosis of blood vessels after radiation therapy.  Blood vessel problems after drug use, such as use of cocaine. RISK FACTORS  Being female.  Being over age 67 with a history of coronary or vascular disease.  Smoking.  Congestive heart failure.  Diabetes.  High cholesterol.  High blood pressure (hypertension). SIGNS AND SYMPTOMS   Severe stomachache. Some people become fearful of eating because of pain.   Abdominal pain or cramps that develop about 30 minutes after a meal.   Abdominal pain after eating that becomes worse over time.   Diarrhea.   Nausea.   Vomiting.   Bloating.   Weight loss. DIAGNOSIS  Chronic mesenteric ischemia is often diagnosed after the person's history is taken, a physical exam is done, and tests are taken. Tests may include:  Ultrasounds.  CT scans.  Angiography. This is an imaging test that uses a dye to obtain a picture of blood flow to the intestine.  Endoscopy. This involves putting a scope through the mouth, down the throat, and into the stomach and  intestine to view the intestinal wall and take small tissue samples (biopsies).  Tonometry. In this test a tiny probe is passed through the mouth and into the stomach or intestine and left in place for 24 hours or more. It measures the output of carbon dioxide by the affected tissues. TREATMENT  Treatment may include:   Medicines to reduce blood clotting and increase blood flow.   Surgery to remove the blockage, repair arteries or veins, and restore blood flow. This may involve:   Angioplasty. This is surgery to widen the affected artery, reduce the blockage, and sometimes insert a small, mesh tube (stent).   Bypass surgery. This may be performed to bypass the blockage and reconnect healthy arteries or veins.   A stent in the affected area to help keep blocked arteries open. HOME CARE INSTRUCTIONS  Only take over-the-counter or prescription medicines as directed by your health care provider.   Keep all follow-up appointments as directed by your health care provider.   Prevent the condition from occurring by:  Doing regular exercise.  Keeping a healthy weight.  Keeping a healthy diet.  Managing cholesterol levels.  Keeping blood pressure and heart rhythm problems under control.  Not smoking. SEEK IMMEDIATE MEDICAL CARE IF:  You have severe abdominal pain.   You notice blood in your stool.   You have nausea, vomiting, or diarrhea.   You have a fever. MAKE SURE YOU:  Understand these instructions.  Will watch your condition.  Will get help right away if you are not doing well or get worse. Document Released: 02/20/2011 Document Revised: 03/05/2013 Document Reviewed: 01/01/2013 ExitCare  Patient Information 2015 [place]. This information is not intended to replace advice given to you by your health care provider. Make sure you discuss any questions you have with your health care provider.   Smoking Cessation Quitting smoking is important to your  health and has many advantages. However, it is not always easy to quit since nicotine is a very addictive drug. Oftentimes, people try 3 times or more before being able to quit. This document explains the best ways for you to prepare to quit smoking. Quitting takes hard work and a lot of effort, but you can do it. ADVANTAGES OF QUITTING SMOKING  You will live longer, feel better, and live better.  Your body will feel the impact of quitting smoking almost immediately.  Within 20 minutes, blood pressure decreases. Your pulse returns to its normal level.  After 8 hours, carbon monoxide levels in the blood return to normal. Your oxygen level increases.  After 24 hours, the chance of having a heart attack starts to decrease. Your breath, hair, and body stop smelling like smoke.  After 48 hours, damaged nerve endings begin to recover. Your sense of taste and smell improve.  After 72 hours, the body is virtually free of nicotine. Your bronchial tubes relax and breathing becomes easier.  After 2 to 12 weeks, lungs can hold more air. Exercise becomes easier and circulation improves.  The risk of having a heart attack, stroke, cancer, or lung disease is greatly reduced.  After 1 year, the risk of coronary heart disease is cut in half.  After 5 years, the risk of stroke falls to the same as a nonsmoker.  After 10 years, the risk of lung cancer is cut in half and the risk of other cancers decreases significantly.  After 15 years, the risk of coronary heart disease drops, usually to the level of a nonsmoker.  If you are pregnant, quitting smoking will improve your chances of having a healthy baby.  The people you live with, especially any children, will be healthier.  You will have extra money to spend on things other than cigarettes. QUESTIONS TO THINK ABOUT BEFORE ATTEMPTING TO QUIT You may want to talk about your answers with your health care provider.  Why do you want to quit?  If you  tried to quit in the past, what helped and what did not?  What will be the most difficult situations for you after you quit? How will you plan to handle them?  Who can help you through the tough times? Your family? Friends? A health care provider?  What pleasures do you get from smoking? What ways can you still get pleasure if you quit? Here are some questions to ask your health care provider:  How can you help me to be successful at quitting?  What medicine do you think would be best for me and how should I take it?  What should I do if I need more help?  What is smoking withdrawal like? How can I get information on withdrawal? GET READY  Set a quit date.  Change your environment by getting rid of all cigarettes, ashtrays, matches, and lighters in your home, car, or work. Do not let people smoke in your home.  Review your past attempts to quit. Think about what worked and what did not. GET SUPPORT AND ENCOURAGEMENT You have a better chance of being successful if you have help. You can get support in many ways.  Tell your family, friends, and  coworkers that you are going to quit and need their support. Ask them not to smoke around you.  Get individual, group, or telephone counseling and support. Programs are available at [employer] and health centers. Call your local health department for information about programs in your area.  Spiritual beliefs and practices may help some smokers quit.  Download a "quit meter" on your computer to keep track of quit statistics, such as how long you have gone without smoking, cigarettes not smoked, and money saved.  Get a self-help book about quitting smoking and staying off tobacco. [place] yourself from urges to smoke. Talk to someone, go for a walk, or occupy your time with a task.  Change your normal routine. Take a different route to work. Drink tea instead of coffee. Eat breakfast in a different  place.  Reduce your stress. Take a hot bath, exercise, or read a book.  Plan something enjoyable to do every day. Reward yourself for not smoking.  Explore interactive web-based programs that specialize in helping you quit. GET MEDICINE AND USE IT CORRECTLY Medicines can help you stop smoking and decrease the urge to smoke. Combining medicine with the above behavioral methods and support can greatly increase your chances of successfully quitting smoking.  Nicotine replacement therapy helps deliver nicotine to your body without the negative effects and risks of smoking. Nicotine replacement therapy includes nicotine gum, lozenges, inhalers, nasal sprays, and skin patches. Some may be available over-the-counter and others require a prescription.  Antidepressant medicine helps people abstain from smoking, but how this works is unknown. This medicine is available by prescription.  Nicotinic receptor partial agonist medicine simulates the effect of nicotine in your brain. This medicine is available by prescription. Ask your health care provider for advice about which medicines to use and how to use them based on your health history. Your health care provider will tell you what side effects to look out for if you choose to be on a medicine or therapy. Carefully read the information on the package. Do not use any other product containing nicotine while using a nicotine replacement product.  RELAPSE OR DIFFICULT SITUATIONS Most relapses occur within the first 3 months after quitting. Do not be discouraged if you start smoking again. Remember, most people try several times before finally quitting. You may have symptoms of withdrawal because your body is used to nicotine. You may crave cigarettes, be irritable, feel very hungry, cough often, get headaches, or have difficulty concentrating. The withdrawal symptoms are only temporary. They are strongest when you first quit, but they will go away within 10-14  days. To reduce the chances of relapse, try to:  Avoid drinking alcohol. Drinking lowers your chances of successfully quitting.  Reduce the amount of caffeine you consume. Once you quit smoking, the amount of caffeine in your body increases and can give you symptoms, such as a rapid heartbeat, sweating, and anxiety.  Avoid smokers because they can make you want to smoke.  Do not let weight gain distract you. Many smokers will gain weight when they quit, usually less than 10 pounds. Eat a healthy diet and stay active. You can always lose the weight gained after you quit.  Find ways to improve your mood other than smoking. FOR MORE INFORMATION  www.smokefree.gov  Document Released: 06/27/2001 Document Revised: 11/17/2013 Document Reviewed: 10/12/2011 [facility] Patient Information 2015 [place], [place]. This information is not intended to replace advice given to you by your health  care provider. Make sure you discuss any questions you have with your health care provider.
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
## 2014-04-08 ENCOUNTER — Ambulatory Visit: Payer: Commercial Managed Care - HMO

## 2014-04-09 ENCOUNTER — Ambulatory Visit (INDEPENDENT_AMBULATORY_CARE_PROVIDER_SITE_OTHER): Payer: Commercial Managed Care - HMO | Admitting: Internal Medicine

## 2014-04-09 ENCOUNTER — Other Ambulatory Visit: Payer: Self-pay

## 2014-04-09 ENCOUNTER — Encounter: Payer: Self-pay | Admitting: Internal Medicine

## 2014-04-09 VITALS — BP 146/78 | HR 66 | Temp 97.7°F | Wt 123.1 lb

## 2014-04-09 DIAGNOSIS — IMO0002 Reserved for concepts with insufficient information to code with codable children: Secondary | ICD-10-CM

## 2014-04-09 DIAGNOSIS — T148XXA Other injury of unspecified body region, initial encounter: Secondary | ICD-10-CM

## 2014-04-09 DIAGNOSIS — G479 Sleep disorder, unspecified: Secondary | ICD-10-CM | POA: Insufficient documentation

## 2014-04-09 DIAGNOSIS — F17200 Nicotine dependence, unspecified, uncomplicated: Secondary | ICD-10-CM

## 2014-04-09 DIAGNOSIS — Z23 Encounter for immunization: Secondary | ICD-10-CM

## 2014-04-09 DIAGNOSIS — L259 Unspecified contact dermatitis, unspecified cause: Secondary | ICD-10-CM

## 2014-04-09 DIAGNOSIS — I739 Peripheral vascular disease, unspecified: Secondary | ICD-10-CM

## 2014-04-09 DIAGNOSIS — L570 Actinic keratosis: Secondary | ICD-10-CM

## 2014-04-09 NOTE — Progress Notes (Signed)
   Subjective:    Patient ID: Brandi Raymond, female    DOB: [birth year], 80 y.o.   MRN: [id number]  HPI   She sustained an injury to the left shin 03/06/14 after she lost her balance while cleaning & fell against a piece of furniture. This was sutured 02/25/14 sutures removed 8/21 at the urgent care.  She is concerned as the lesion will scab over & then the scab will come off. She's been applying triple antibiotic ointment to it.  There's been some associated redness which has improved. She believes swelling has been stable.  She has some intermittent circumferential pain varying from 0-5. She also had some localized numbness and tingling.  Significant is that she continues to smoke.  She is a history of peripheral vascular disease    Review of Systems    She denies fever, chills, sweats, purulent drainage from the wound.  There was no neuro or cardiac prodrome prior to the injury   Denied were any change in heart rhythm or rate prior to the event. There was no associated chest pain or shortness of breath .  Also specifically denied prior to the episode were headache, limb weakness, tingling, or numbness. No seizure activity noted.  She continues to use clonazepam 1 mg at bedtime to sleep.  She is concerned about a new lesion below OD.     Objective:   Physical Exam    Positive or pertinent findings include: She is thin but appears adequately nourished. Breath sounds are decreased throughout all lung fields. The dorsalis pedis pulses are significantly decreased and nonpalpable. The posterior tibial pulses are decreased but palpable. She has osteoarthritic changes of the hands and toes. She has varicose veins over the shins greater on the left. She has a small keratosis under the right eye. There is some subcutaneous granulomatous change and slight hyperpigmentation around the laceration on the left shin. This is well-healed with no evidence of increased warmth or  purulence.  Eyes: No conjunctival inflammation or scleral icterus is present. Heart:  Normal rate and regular rhythm. S1 and S2 normal without gallop, murmur, click, rub or other extra sounds   Lungs:No increased work of breathing.  Skin:Warm & dry.  Intact without suspicious lesions or rashes ; no jaundice or tenting Lymphatic: No lymphadenopathy is noted about the head, neck, axilla            Assessment & Plan:  #1 laceration, well-healed.  #2 probable contact dermatitis from the antibiotic ointment.  #3 peripheral vascular disease  #4 chronic sleep disorder.  #5 smoker  See AVS

## 2014-04-09 NOTE — Patient Instructions (Addendum)
To prevent sleep dysfunction follow these instructions for sleep hygiene. Do not read, watch TV, or eat in bed. Do not get into bed until you are ready to turn off the light &  to go to sleep. Do not ingest stimulants ( decongestants, diet pills, nicotine, caffeine) after the evening meal.Do not take daytime naps.Cardiovascular exercise, this can be as simple a program as walking, is recommended 30-45 minutes 3-4 times per week. If you're not exercising you should take 6-8 weeks to build up to this level. Clonazepam is among those which experts have documented to have a very  high risk of affecting  mental  alertness  & balance. This results in increased risk of falling with serious health or life threatening injury. Such medication should be taken as infrequently as possible and @  the lowest possible dose.It should not be taken with alcohol, sedatives  or other agents which have a similar  adverse risk potential. These risks are greater as we age as there is decreased ability of the liver and kidneys to metabolize and excrete the medication, resulting in   increased blood levels of the active ingredient. Use  Aveeno Daily  Moisturizing Lotion  twice a day  for the keratosis. Bathe with moisturizing liquid soap , not bar soap. Stop antibiotic ointment. Please think about quitting smoking. Review the risks we discussed. Please call 1-800-QUIT-NOW [phone]) for free smoking cessation counseling.

## 2014-04-09 NOTE — Telephone Encounter (Signed)
8.27.15 #30 called to pharmacy 9.24.15 last ov

## 2014-04-09 NOTE — Telephone Encounter (Signed)
  OK BUT : This medication should be taken as little as possible. It can affect balance which is an issue for you. I recommend that you not take it more than one half pill every third night as needed only. If you have any additional falls; this medicine must be discontinued.

## 2014-04-09 NOTE — Progress Notes (Signed)
Pre visit review using our clinic review tool, if applicable. No additional management support is needed unless otherwise documented below in the visit note. 

## 2014-04-10 MED ORDER — CLONAZEPAM 1 MG PO TABS: ORAL_TABLET | ORAL | Status: DC

## 2014-04-10 NOTE — Telephone Encounter (Signed)
Clonazepam called to pharmacy  

## 2014-04-12 ENCOUNTER — Other Ambulatory Visit: Payer: Self-pay | Admitting: Neurology

## 2014-04-28 ENCOUNTER — Other Ambulatory Visit: Payer: Self-pay | Admitting: Internal Medicine

## 2014-04-28 ENCOUNTER — Other Ambulatory Visit: Payer: Self-pay | Admitting: *Deleted

## 2014-04-28 DIAGNOSIS — K551 Chronic vascular disorders of intestine: Secondary | ICD-10-CM

## 2014-04-28 MED ORDER — CLOPIDOGREL BISULFATE 75 MG PO TABS: 75.0000 mg | ORAL_TABLET | Freq: Every day | ORAL | Status: DC

## 2014-04-28 NOTE — Telephone Encounter (Signed)
X 3 mos 

## 2014-04-29 ENCOUNTER — Other Ambulatory Visit: Payer: Self-pay

## 2014-04-29 MED ORDER — CLONAZEPAM 1 MG PO TABS: ORAL_TABLET | ORAL | Status: DC

## 2014-04-29 NOTE — Telephone Encounter (Signed)
Received request from [employer] [phone]

## 2014-04-29 NOTE — Telephone Encounter (Signed)
Script faxed to [facility] [phone]

## 2014-04-29 NOTE — Telephone Encounter (Signed)
OK BUT this medication is among those which experts have documented to have a very  high risk of affecting  mental  alertness  & balance. This results in increased risk of falling with serious health or life threatening injury.  Such medication should be taken as infrequently as possible and @  the lowest possible dose !!! These risks are greater as we age as there is decreased ability of the liver and kidneys to metabolize and excrete the medication, resulting in   increased blood levels of the active ingredient.

## 2014-05-05 ENCOUNTER — Other Ambulatory Visit: Payer: Self-pay

## 2014-05-05 DIAGNOSIS — K551 Chronic vascular disorders of intestine: Secondary | ICD-10-CM

## 2014-05-05 MED ORDER — CLOPIDOGREL BISULFATE 75 MG PO TABS: 75.0000 mg | ORAL_TABLET | Freq: Every day | ORAL | Status: DC

## 2014-05-26 ENCOUNTER — Other Ambulatory Visit: Payer: Self-pay

## 2014-05-26 MED ORDER — PRAVASTATIN SODIUM 40 MG PO TABS: 40.0000 mg | ORAL_TABLET | Freq: Every day | ORAL | Status: DC

## 2014-06-04 ENCOUNTER — Ambulatory Visit: Payer: Medicare Other | Admitting: Neurology

## 2014-06-25 ENCOUNTER — Encounter (HOSPITAL_COMMUNITY): Payer: Self-pay | Admitting: Surgery

## 2014-07-06 ENCOUNTER — Emergency Department (HOSPITAL_COMMUNITY): Payer: Commercial Managed Care - HMO

## 2014-07-06 ENCOUNTER — Encounter (HOSPITAL_COMMUNITY): Payer: Self-pay | Admitting: Emergency Medicine

## 2014-07-06 ENCOUNTER — Emergency Department (HOSPITAL_COMMUNITY)
Admission: EM | Admit: 2014-07-06 | Discharge: 2014-07-06 | Disposition: A | Discharge status: Home / Self Care | Payer: Commercial Managed Care - HMO | Attending: Emergency Medicine | Admitting: Emergency Medicine

## 2014-07-06 DIAGNOSIS — I10 Essential (primary) hypertension: Secondary | ICD-10-CM | POA: Diagnosis not present

## 2014-07-06 DIAGNOSIS — Z79899 Other long term (current) drug therapy: Secondary | ICD-10-CM | POA: Insufficient documentation

## 2014-07-06 DIAGNOSIS — S0181XA Laceration without foreign body of other part of head, initial encounter: Secondary | ICD-10-CM | POA: Diagnosis not present

## 2014-07-06 DIAGNOSIS — Z8669 Personal history of other diseases of the nervous system and sense organs: Secondary | ICD-10-CM | POA: Diagnosis not present

## 2014-07-06 DIAGNOSIS — Z9889 Other specified postprocedural states: Secondary | ICD-10-CM | POA: Diagnosis not present

## 2014-07-06 DIAGNOSIS — E039 Hypothyroidism, unspecified: Secondary | ICD-10-CM | POA: Diagnosis not present

## 2014-07-06 DIAGNOSIS — R55 Syncope and collapse: Secondary | ICD-10-CM | POA: Diagnosis not present

## 2014-07-06 DIAGNOSIS — Z7982 Long term (current) use of aspirin: Secondary | ICD-10-CM | POA: Diagnosis not present

## 2014-07-06 DIAGNOSIS — Y9389 Activity, other specified: Secondary | ICD-10-CM | POA: Diagnosis not present

## 2014-07-06 DIAGNOSIS — I2510 Atherosclerotic heart disease of native coronary artery without angina pectoris: Secondary | ICD-10-CM | POA: Insufficient documentation

## 2014-07-06 DIAGNOSIS — Z8781 Personal history of (healed) traumatic fracture: Secondary | ICD-10-CM | POA: Insufficient documentation

## 2014-07-06 DIAGNOSIS — Y9289 Other specified places as the place of occurrence of the external cause: Secondary | ICD-10-CM | POA: Diagnosis not present

## 2014-07-06 DIAGNOSIS — W010XXA Fall on same level from slipping, tripping and stumbling without subsequent striking against object, initial encounter: Secondary | ICD-10-CM | POA: Insufficient documentation

## 2014-07-06 DIAGNOSIS — E785 Hyperlipidemia, unspecified: Secondary | ICD-10-CM | POA: Diagnosis not present

## 2014-07-06 DIAGNOSIS — Z8719 Personal history of other diseases of the digestive system: Secondary | ICD-10-CM | POA: Insufficient documentation

## 2014-07-06 DIAGNOSIS — W19XXXA Unspecified fall, initial encounter: Secondary | ICD-10-CM

## 2014-07-06 DIAGNOSIS — S0990XA Unspecified injury of head, initial encounter: Secondary | ICD-10-CM | POA: Diagnosis present

## 2014-07-06 DIAGNOSIS — IMO0002 Reserved for concepts with insufficient information to code with codable children: Secondary | ICD-10-CM

## 2014-07-06 DIAGNOSIS — I252 Old myocardial infarction: Secondary | ICD-10-CM | POA: Insufficient documentation

## 2014-07-06 DIAGNOSIS — Z86010 Personal history of colonic polyps: Secondary | ICD-10-CM | POA: Diagnosis not present

## 2014-07-06 DIAGNOSIS — Z862 Personal history of diseases of the blood and blood-forming organs and certain disorders involving the immune mechanism: Secondary | ICD-10-CM | POA: Diagnosis not present

## 2014-07-06 DIAGNOSIS — Y998 Other external cause status: Secondary | ICD-10-CM | POA: Diagnosis not present

## 2014-07-06 DIAGNOSIS — Z720 Tobacco use: Secondary | ICD-10-CM | POA: Diagnosis not present

## 2014-07-06 DIAGNOSIS — Z7902 Long term (current) use of antithrombotics/antiplatelets: Secondary | ICD-10-CM | POA: Diagnosis not present

## 2014-07-06 DIAGNOSIS — J449 Chronic obstructive pulmonary disease, unspecified: Secondary | ICD-10-CM | POA: Diagnosis not present

## 2014-07-06 DIAGNOSIS — S0512XA Contusion of eyeball and orbital tissues, left eye, initial encounter: Secondary | ICD-10-CM | POA: Insufficient documentation

## 2014-07-06 LAB — CBC WITH DIFFERENTIAL/PLATELET
Basophils Absolute: 0 10*3/uL (ref 0.0–0.1)
Basophils Relative: 0 % (ref 0–1)
Eosinophils Absolute: 0.1 10*3/uL (ref 0.0–0.7)
Eosinophils Relative: 1 % (ref 0–5)
HCT: 37.3 % (ref 36.0–46.0)
Hemoglobin: 12.3 g/dL (ref 12.0–15.0)
Lymphocytes Relative: 15 % (ref 12–46)
Lymphs Abs: 1.4 10*3/uL (ref 0.7–4.0)
MCH: 29.9 pg (ref 26.0–34.0)
MCHC: 33 g/dL (ref 30.0–36.0)
MCV: 90.8 fL (ref 78.0–100.0)
Monocytes Absolute: 1.1 10*3/uL — ABNORMAL HIGH (ref 0.1–1.0)
Monocytes Relative: 12 % (ref 3–12)
Neutro Abs: 6.8 10*3/uL (ref 1.7–7.7)
Neutrophils Relative %: 72 % (ref 43–77)
Platelets: 132 10*3/uL — ABNORMAL LOW (ref 150–400)
RBC: 4.11 MIL/uL (ref 3.87–5.11)
RDW: 13.5 % (ref 11.5–15.5)
WBC: 9.4 10*3/uL (ref 4.0–10.5)

## 2014-07-06 LAB — BASIC METABOLIC PANEL
Anion gap: 13 (ref 5–15)
BUN: 13 mg/dL (ref 6–23)
CO2: 26 mEq/L (ref 19–32)
Calcium: 8.8 mg/dL (ref 8.4–10.5)
Chloride: 102 mEq/L (ref 96–112)
Creatinine, Ser: 0.65 mg/dL (ref 0.50–1.10)
GFR calc Af Amer: >90 mL/min (ref >90)
GFR calc non Af Amer: 83 mL/min — ABNORMAL LOW (ref >90)
Glucose, Bld: 89 mg/dL (ref 70–99)
Potassium: 4.5 mEq/L (ref 3.7–5.3)
Sodium: 141 mEq/L (ref 137–147)

## 2014-07-06 MED ORDER — ACETAMINOPHEN 325 MG PO TABS
650.0000 mg | ORAL_TABLET | Freq: Once | ORAL | Status: AC
  Administered 2014-07-06: 650 mg via ORAL
  Filled 2014-07-06: qty 2

## 2014-07-06 MED ORDER — LIDOCAINE-EPINEPHRINE 1 %-1:100000 IJ SOLN
5.0000 mL | Freq: Once | INTRAMUSCULAR | Status: AC
  Administered 2014-07-06: 1 mL via INTRADERMAL
  Filled 2014-07-06: qty 1
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
## 2014-07-06 NOTE — Discharge Instructions (Signed)

## 2014-07-07 ENCOUNTER — Encounter: Payer: Self-pay | Admitting: Internal Medicine

## 2014-07-07 DIAGNOSIS — R911 Solitary pulmonary nodule: Secondary | ICD-10-CM | POA: Insufficient documentation

## 2014-07-13 ENCOUNTER — Other Ambulatory Visit: Payer: Self-pay

## 2014-07-13 MED ORDER — CLONAZEPAM 1 MG PO TABS: ORAL_TABLET | ORAL | Status: DC

## 2014-07-13 NOTE — Telephone Encounter (Signed)
Clonazepam has been called to [facility] [phone]

## 2014-07-13 NOTE — Telephone Encounter (Signed)
315  1/2 ,not 1 as nedded  OVBNR

## 2014-08-13 ENCOUNTER — Other Ambulatory Visit: Payer: Self-pay

## 2014-08-13 MED ORDER — CLONAZEPAM 1 MG PO TABS: ORAL_TABLET | ORAL | Status: DC

## 2014-08-13 NOTE — Telephone Encounter (Signed)
#  15 1/2 qhs prn This medication is among those which experts have documented to have a very  high risk of affecting  mental  alertness  & balance. This results in increased risk of falling with serious health or life threatening injury. Such medication should be taken as infrequently as possible and @  the lowest possible dose.It should not be taken with alcohol, sedatives  or other agents which have a similar  adverse risk potential. These risks are greater as we age as there is decreased ability of the liver and kidneys to metabolize and excrete the medication, resulting in   increased blood levels of the active ingredient.

## 2014-08-13 NOTE — Telephone Encounter (Signed)
Script for Clonazepam has been faxed to [facility] on [address]

## 2014-08-18 ENCOUNTER — Other Ambulatory Visit: Payer: Self-pay | Admitting: Internal Medicine

## 2014-08-18 NOTE — Telephone Encounter (Signed)
OK X1 

## 2014-08-20 ENCOUNTER — Other Ambulatory Visit: Payer: Self-pay | Admitting: Internal Medicine

## 2014-08-20 ENCOUNTER — Telehealth: Payer: Self-pay

## 2014-08-20 ENCOUNTER — Encounter: Payer: Self-pay | Admitting: Internal Medicine

## 2014-08-20 MED ORDER — CLONAZEPAM 1 MG PO TABS: ORAL_TABLET | ORAL | Status: DC

## 2014-08-20 NOTE — Telephone Encounter (Signed)
Script for Clonazepam has been faxed to [facility] on [address]

## 2014-08-20 NOTE — Telephone Encounter (Signed)
-----   Message from Hendricks Limes, MD sent at 08/20/2014 12:58 PM EST ----- Refill Clonazepam #15 ; 1/2 qhs prn only, not routinely

## 2014-09-11 ENCOUNTER — Other Ambulatory Visit: Payer: Self-pay

## 2014-09-11 NOTE — Telephone Encounter (Signed)
Rx sent 2/4 should last until @ least 3/4 Written as 1/2 qhs prn only;15 were Rxed

## 2014-10-08 ENCOUNTER — Ambulatory Visit: Payer: Commercial Managed Care - HMO | Admitting: Neurology

## 2014-10-08 ENCOUNTER — Encounter: Payer: Self-pay | Admitting: Family

## 2014-10-08 ENCOUNTER — Telehealth: Payer: Self-pay | Admitting: Internal Medicine

## 2014-10-08 ENCOUNTER — Telehealth: Payer: Self-pay

## 2014-10-08 NOTE — Telephone Encounter (Signed)
Patient was not able to be seen because she did not have proper referral. She rescheduled her appointment.

## 2014-10-08 NOTE — Telephone Encounter (Signed)
Needs humana referral to [facility].  Patient has appiontment in April.

## 2014-10-12 ENCOUNTER — Other Ambulatory Visit: Payer: Self-pay

## 2014-10-12 ENCOUNTER — Encounter: Payer: Self-pay | Admitting: Family

## 2014-10-12 ENCOUNTER — Ambulatory Visit (INDEPENDENT_AMBULATORY_CARE_PROVIDER_SITE_OTHER): Payer: Commercial Managed Care - HMO | Admitting: Family

## 2014-10-12 ENCOUNTER — Ambulatory Visit (HOSPITAL_COMMUNITY)
Admission: RE | Admit: 2014-10-12 | Discharge: 2014-10-12 | Disposition: A | Discharge status: Home / Self Care | Payer: Commercial Managed Care - HMO | Source: Ambulatory Visit | Attending: Family | Admitting: Family

## 2014-10-12 ENCOUNTER — Ambulatory Visit (INDEPENDENT_AMBULATORY_CARE_PROVIDER_SITE_OTHER)
Admission: RE | Admit: 2014-10-12 | Discharge: 2014-10-12 | Disposition: A | Discharge status: Home / Self Care | Payer: Commercial Managed Care - HMO | Source: Ambulatory Visit | Attending: Family | Admitting: Family

## 2014-10-12 VITALS — BP 118/73 | HR 83 | Temp 97.2°F | Resp 24 | Ht 65.5 in | Wt 124.1 lb

## 2014-10-12 DIAGNOSIS — K551 Chronic vascular disorders of intestine: Secondary | ICD-10-CM

## 2014-10-12 DIAGNOSIS — I6529 Occlusion and stenosis of unspecified carotid artery: Secondary | ICD-10-CM | POA: Insufficient documentation

## 2014-10-12 DIAGNOSIS — Z720 Tobacco use: Secondary | ICD-10-CM

## 2014-10-12 DIAGNOSIS — F17200 Nicotine dependence, unspecified, uncomplicated: Secondary | ICD-10-CM

## 2014-10-12 DIAGNOSIS — I6523 Occlusion and stenosis of bilateral carotid arteries: Secondary | ICD-10-CM | POA: Diagnosis not present
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
## 2014-10-12 NOTE — Patient Instructions (Signed)
Stroke Prevention Some medical conditions and behaviors are associated with an increased chance of having a stroke. You may prevent a stroke by making healthy choices and managing medical conditions. HOW CAN I REDUCE MY RISK OF HAVING A STROKE?   Stay physically active. Get at least 30 minutes of activity on most or all days.  Do not smoke. It may also be helpful to avoid exposure to secondhand smoke.  Limit alcohol use. Moderate alcohol use is considered to be:  No more than 2 drinks per day for men.  No more than 1 drink per day for nonpregnant women.  Eat healthy foods. This involves:  Eating 5 or more servings of fruits and vegetables a day.  Making dietary changes that address high blood pressure (hypertension), high cholesterol, diabetes, or obesity.  Manage your cholesterol levels.  Making food choices that are high in fiber and low in saturated fat, trans fat, and cholesterol may control cholesterol levels.  Take any prescribed medicines to control cholesterol as directed by your health care provider.  Manage your diabetes.  Controlling your carbohydrate and sugar intake is recommended to manage diabetes.  Take any prescribed medicines to control diabetes as directed by your health care provider.  Control your hypertension.  Making food choices that are low in salt (sodium), saturated fat, trans fat, and cholesterol is recommended to manage hypertension.  Take any prescribed medicines to control hypertension as directed by your health care provider.  Maintain a healthy weight.  Reducing calorie intake and making food choices that are low in sodium, saturated fat, trans fat, and cholesterol are recommended to manage weight.  Stop drug abuse.  Avoid taking birth control pills.  Talk to your health care provider about the risks of taking birth control pills if you are over 35 years old, smoke, get migraines, or have ever had a blood clot.  Get evaluated for sleep  disorders (sleep apnea).  Talk to your health care provider about getting a sleep evaluation if you snore a lot or have excessive sleepiness.  Take medicines only as directed by your health care provider.  For some people, aspirin or blood thinners (anticoagulants) are helpful in reducing the risk of forming abnormal blood clots that can lead to stroke. If you have the irregular heart rhythm of atrial fibrillation, you should be on a blood thinner unless there is a good reason you cannot take them.  Understand all your medicine instructions.  Make sure that other conditions (such as anemia or atherosclerosis) are addressed. SEEK IMMEDIATE MEDICAL CARE IF:   You have sudden weakness or numbness of the face, arm, or leg, especially on one side of the body.  Your face or eyelid droops to one side.  You have sudden confusion.  You have trouble speaking (aphasia) or understanding.  You have sudden trouble seeing in one or both eyes.  You have sudden trouble walking.  You have dizziness.  You have a loss of balance or coordination.  You have a sudden, severe headache with no known cause.  You have new chest pain or an irregular heartbeat. Any of these symptoms may represent a serious problem that is an emergency. Do not wait to see if the symptoms will go away. Get medical help at once. Call your local emergency services (911 in [place] Do not drive yourself to the hospital. Document Released: 08/10/2004 Document Revised: 11/17/2013 Document Reviewed: 01/03/2013 [employer] Patient Information 2015 [employer]. This information is not intended to replace advice given   to you by your health care provider. Make sure you discuss any questions you have with your health care provider.    Peripheral Vascular Disease Peripheral Vascular Disease (PVD), also called Peripheral Arterial Disease (PAD), is a circulation problem caused by cholesterol (atherosclerotic plaque) deposits in the  arteries. PVD commonly occurs in the lower extremities (legs) but it can occur in other areas of the body, such as your arms. The cholesterol buildup in the arteries reduces blood flow which can cause pain and other serious problems. The presence of PVD can place a person at risk for Coronary Artery Disease (CAD).  CAUSES  Causes of PVD can be many. It is usually associated with more than one risk factor such as:   High Cholesterol.  Smoking.  Diabetes.  Lack of exercise or inactivity.  High blood pressure (hypertension).  Obesity.  Family history. SYMPTOMS   When the lower extremities are affected, patients with PVD may experience:  Leg pain with exertion or physical activity. This is called INTERMITTENT CLAUDICATION. This may present as cramping or numbness with physical activity. The location of the pain is associated with the level of blockage. For example, blockage at the abdominal level (distal abdominal aorta) may result in buttock or hip pain. Lower leg arterial blockage may result in calf pain.  As PVD becomes more severe, pain can develop with less physical activity.  In people with severe PVD, leg pain may occur at rest.  Other PVD signs and symptoms:  Leg numbness or weakness.  Coldness in the affected leg or foot, especially when compared to the other leg.  A change in leg color.  Patients with significant PVD are more prone to ulcers or sores on toes, feet or legs. These may take longer to heal or may reoccur. The ulcers or sores can become infected.  If signs and symptoms of PVD are ignored, gangrene may occur. This can result in the loss of toes or loss of an entire limb.  Not all leg pain is related to PVD. Other medical conditions can cause leg pain such as:  Blood clots (embolism) or Deep Vein Thrombosis.  Inflammation of the blood vessels (vasculitis).  Spinal stenosis. DIAGNOSIS  Diagnosis of PVD can involve several different types of tests. These  can include:  Pulse Volume Recording Method (PVR). This test is simple, painless and does not involve the use of X-rays. PVR involves measuring and comparing the blood pressure in the arms and legs. An ABI (Ankle-Brachial Index) is calculated. The normal ratio of blood pressures is 1. As this number becomes smaller, it indicates more severe disease.  < 0.95 - indicates significant narrowing in one or more leg vessels.  <0.8 - there will usually be pain in the foot, leg or buttock with exercise.  <0.4 - will usually have pain in the legs at rest.  <0.25 - usually indicates limb threatening PVD.  Doppler detection of pulses in the legs. This test is painless and checks to see if you have a pulses in your legs/feet.  A dye or contrast material (a substance that highlights the blood vessels so they show up on x-ray) may be given to help your caregiver better see the arteries for the following tests. The dye is eliminated from your body by the kidney's. Your caregiver may order blood work to check your kidney function and other laboratory values before the following tests are performed:  Magnetic Resonance Angiography (MRA). An MRA is a picture study of the blood   vessels and arteries. The MRA machine uses a large magnet to produce images of the blood vessels.  Computed Tomography Angiography (CTA). A CTA is a specialized x-ray that looks at how the blood flows in your blood vessels. An IV may be inserted into your arm so contrast dye can be injected.  Angiogram. Is a procedure that uses x-rays to look at your blood vessels. This procedure is minimally invasive, meaning a small incision (cut) is made in your groin. A small tube (catheter) is then inserted into the artery of your groin. The catheter is guided to the blood vessel or artery your caregiver wants to examine. Contrast dye is injected into the catheter. X-rays are then taken of the blood vessel or artery. After the images are obtained, the  catheter is taken out. TREATMENT  Treatment of PVD involves many interventions which may include:  Lifestyle changes:  Quitting smoking.  Exercise.  Following a low fat, low cholesterol diet.  Control of diabetes.  Foot care is very important to the PVD patient. Good foot care can help prevent infection.  Medication:  Cholesterol-lowering medicine.  Blood pressure medicine.  Anti-platelet drugs.  Certain medicines may reduce symptoms of Intermittent Claudication.  Interventional/Surgical options:  Angioplasty. An Angioplasty is a procedure that inflates a balloon in the blocked artery. This opens the blocked artery to improve blood flow.  Stent Implant. A wire mesh tube (stent) is placed in the artery. The stent expands and stays in place, allowing the artery to remain open.  Peripheral Bypass Surgery. This is a surgical procedure that reroutes the blood around a blocked artery to help improve blood flow. This type of procedure may be performed if Angioplasty or stent implants are not an option. SEEK IMMEDIATE MEDICAL CARE IF:   You develop pain or numbness in your arms or legs.  Your arm or leg turns cold, becomes blue in color.  You develop redness, warmth, swelling and pain in your arms or legs. MAKE SURE YOU:   Understand these instructions.  Will watch your condition.  Will get help right away if you are not doing well or get worse. Document Released: 08/10/2004 Document Revised: 09/25/2011 Document Reviewed: 07/07/2008 [facility] Patient Information 2015 [place], [place]. This information is not intended to replace advice given to you by your health care provider. Make sure you discuss any questions you have with your health care provider.   Smoking Cessation Quitting smoking is important to your health and has many advantages. However, it is not always easy to quit since nicotine is a very addictive drug. Oftentimes, people try 3 times or more before being able  to quit. This document explains the best ways for you to prepare to quit smoking. Quitting takes hard work and a lot of effort, but you can do it. ADVANTAGES OF QUITTING SMOKING  You will live longer, feel better, and live better.  Your body will feel the impact of quitting smoking almost immediately.  Within 20 minutes, blood pressure decreases. Your pulse returns to its normal level.  After 8 hours, carbon monoxide levels in the blood return to normal. Your oxygen level increases.  After 24 hours, the chance of having a heart attack starts to decrease. Your breath, hair, and body stop smelling like smoke.  After 48 hours, damaged nerve endings begin to recover. Your sense of taste and smell improve.  After 72 hours, the body is virtually free of nicotine. Your bronchial tubes relax and breathing becomes easier.  After 2  to 12 weeks, lungs can hold more air. Exercise becomes easier and circulation improves.  The risk of having a heart attack, stroke, cancer, or lung disease is greatly reduced.  After 1 year, the risk of coronary heart disease is cut in half.  After 5 years, the risk of stroke falls to the same as a nonsmoker.  After 10 years, the risk of lung cancer is cut in half and the risk of other cancers decreases significantly.  After 15 years, the risk of coronary heart disease drops, usually to the level of a nonsmoker.  If you are pregnant, quitting smoking will improve your chances of having a healthy baby.  The people you live with, especially any children, will be healthier.  You will have extra money to spend on things other than cigarettes. QUESTIONS TO THINK ABOUT BEFORE ATTEMPTING TO QUIT You may want to talk about your answers with your health care provider.  Why do you want to quit?  If you tried to quit in the past, what helped and what did not?  What will be the most difficult situations for you after you quit? How will you plan to handle them?  Who  can help you through the tough times? Your family? Friends? A health care provider?  What pleasures do you get from smoking? What ways can you still get pleasure if you quit? Here are some questions to ask your health care provider:  How can you help me to be successful at quitting?  What medicine do you think would be best for me and how should I take it?  What should I do if I need more help?  What is smoking withdrawal like? How can I get information on withdrawal? GET READY  Set a quit date.  Change your environment by getting rid of all cigarettes, ashtrays, matches, and lighters in your home, car, or work. Do not let people smoke in your home.  Review your past attempts to quit. Think about what worked and what did not. GET SUPPORT AND ENCOURAGEMENT You have a better chance of being successful if you have help. You can get support in many ways.  Tell your family, friends, and coworkers that you are going to quit and need their support. Ask them not to smoke around you.  Get individual, group, or telephone counseling and support. Programs are available at [employer] and health centers. Call your local health department for information about programs in your area.  Spiritual beliefs and practices may help some smokers quit.  Download a "quit meter" on your computer to keep track of quit statistics, such as how long you have gone without smoking, cigarettes not smoked, and money saved.  Get a self-help book about quitting smoking and staying off tobacco. [place] yourself from urges to smoke. Talk to someone, go for a walk, or occupy your time with a task.  Change your normal routine. Take a different route to work. Drink tea instead of coffee. Eat breakfast in a different place.  Reduce your stress. Take a hot bath, exercise, or read a book.  Plan something enjoyable to do every day. Reward yourself for not smoking.  Explore  interactive web-based programs that specialize in helping you quit. GET MEDICINE AND USE IT CORRECTLY Medicines can help you stop smoking and decrease the urge to smoke. Combining medicine with the above behavioral methods and support can greatly increase your chances of successfully quitting smoking.  Nicotine  replacement therapy helps deliver nicotine to your body without the negative effects and risks of smoking. Nicotine replacement therapy includes nicotine gum, lozenges, inhalers, nasal sprays, and skin patches. Some may be available over-the-counter and others require a prescription.  Antidepressant medicine helps people abstain from smoking, but how this works is unknown. This medicine is available by prescription.  Nicotinic receptor partial agonist medicine simulates the effect of nicotine in your brain. This medicine is available by prescription. Ask your health care provider for advice about which medicines to use and how to use them based on your health history. Your health care provider will tell you what side effects to look out for if you choose to be on a medicine or therapy. Carefully read the information on the package. Do not use any other product containing nicotine while using a nicotine replacement product.  RELAPSE OR DIFFICULT SITUATIONS Most relapses occur within the first 3 months after quitting. Do not be discouraged if you start smoking again. Remember, most people try several times before finally quitting. You may have symptoms of withdrawal because your body is used to nicotine. You may crave cigarettes, be irritable, feel very hungry, cough often, get headaches, or have difficulty concentrating. The withdrawal symptoms are only temporary. They are strongest when you first quit, but they will go away within 10-14 days. To reduce the chances of relapse, try to:  Avoid drinking alcohol. Drinking lowers your chances of successfully quitting.  Reduce the amount of caffeine  you consume. Once you quit smoking, the amount of caffeine in your body increases and can give you symptoms, such as a rapid heartbeat, sweating, and anxiety.  Avoid smokers because they can make you want to smoke.  Do not let weight gain distract you. Many smokers will gain weight when they quit, usually less than 10 pounds. Eat a healthy diet and stay active. You can always lose the weight gained after you quit.  Find ways to improve your mood other than smoking. FOR MORE INFORMATION  www.smokefree.gov  Document Released: 06/27/2001 Document Revised: 11/17/2013 Document Reviewed: 10/12/2011 [facility] Patient Information 2015 [place], [place]. This information is not intended to replace advice given to you by your health care provider. Make sure you discuss any questions you have with your health care provider.   Smoking Cessation, Tips for Success If you are ready to quit smoking, congratulations! You have chosen to help yourself be healthier. Cigarettes bring nicotine, tar, carbon monoxide, and other irritants into your body. Your lungs, heart, and blood vessels will be able to work better without these poisons. There are many different ways to quit smoking. Nicotine gum, nicotine patches, a nicotine inhaler, or nicotine nasal spray can help with physical craving. Hypnosis, support groups, and medicines help break the habit of smoking. WHAT THINGS CAN I DO TO MAKE QUITTING EASIER?  Here are some tips to help you quit for good:  Pick a date when you will quit smoking completely. Tell all of your friends and family about your plan to quit on that date.  Do not try to slowly cut down on the number of cigarettes you are smoking. Pick a quit date and quit smoking completely starting on that day.  Throw away all cigarettes.   Clean and remove all ashtrays from your home, work, and car.  On a card, write down your reasons for quitting. Carry the card with you and read it when you get the urge to  smoke.  Cleanse your body of nicotine.  Drink enough water and fluids to keep your urine clear or pale yellow. Do this after quitting to flush the nicotine from your body.  Learn to predict your moods. Do not let a bad situation be your excuse to have a cigarette. Some situations in your life might tempt you into wanting a cigarette.  Never have "just one" cigarette. It leads to wanting another and another. Remind yourself of your decision to quit.  Change habits associated with smoking. If you smoked while driving or when feeling stressed, try other activities to replace smoking. Stand up when drinking your coffee. Brush your teeth after eating. Sit in a different chair when you read the paper. Avoid alcohol while trying to quit, and try to drink fewer caffeinated beverages. Alcohol and caffeine may urge you to smoke.  Avoid foods and drinks that can trigger a desire to smoke, such as sugary or spicy foods and alcohol.  Ask people who smoke not to smoke around you.  Have something planned to do right after eating or having a cup of coffee. For example, plan to take a walk or exercise.  Try a relaxation exercise to calm you down and decrease your stress. Remember, you may be tense and nervous for the first 2 weeks after you quit, but this will pass.  Find new activities to keep your hands busy. Play with a pen, coin, or rubber band. Doodle or draw things on paper.  Brush your teeth right after eating. This will help cut down on the craving for the taste of tobacco after meals. You can also try mouthwash.   Use oral substitutes in place of cigarettes. Try using lemon drops, carrots, cinnamon sticks, or chewing gum. Keep them handy so they are available when you have the urge to smoke.  When you have the urge to smoke, try deep breathing.  Designate your home as a nonsmoking area.  If you are a heavy smoker, ask your health care provider about a prescription for nicotine chewing gum. It can  ease your withdrawal from nicotine.  Reward yourself. Set aside the cigarette money you save and buy yourself something nice.  Look for support from others. Join a support group or smoking cessation program. Ask someone at home or at work to help you with your plan to quit smoking.  Always ask yourself, "Do I need this cigarette or is this just a reflex?" Tell yourself, "Today, I choose not to smoke," or "I do not want to smoke." You are reminding yourself of your decision to quit.  Do not replace cigarette smoking with electronic cigarettes (commonly called e-cigarettes). The safety of e-cigarettes is unknown, and some may contain harmful chemicals.  If you relapse, do not give up! Plan ahead and think about what you will do the next time you get the urge to smoke. HOW WILL I FEEL WHEN I QUIT SMOKING? You may have symptoms of withdrawal because your body is used to nicotine (the addictive substance in cigarettes). You may crave cigarettes, be irritable, feel very hungry, cough often, get headaches, or have difficulty concentrating. The withdrawal symptoms are only temporary. They are strongest when you first quit but will go away within 10-14 days. When withdrawal symptoms occur, stay in control. Think about your reasons for quitting. Remind yourself that these are signs that your body is healing and getting used to being without cigarettes. Remember that withdrawal symptoms are easier to treat than the major diseases that smoking can cause.  Even after  the withdrawal is over, expect periodic urges to smoke. However, these cravings are generally short lived and will go away whether you smoke or not. Do not smoke! WHAT RESOURCES ARE AVAILABLE TO HELP ME QUIT SMOKING? Your health care provider can direct you to community resources or hospitals for support, which may include:  Group support.  Education.  Hypnosis.  Therapy. Document Released: 03/31/2004 Document Revised: 11/17/2013 Document  Reviewed: 12/19/2012 [facility] Patient Information 2015 [place], [place]. This information is not intended to replace advice given to you by your health care provider. Make sure you discuss any questions you have with your health care provider.

## 2014-10-19 ENCOUNTER — Encounter: Payer: Self-pay | Admitting: Neurology

## 2014-10-21 ENCOUNTER — Other Ambulatory Visit: Payer: Self-pay

## 2014-10-21 MED ORDER — GABAPENTIN 300 MG PO CAPS: ORAL_CAPSULE | ORAL | Status: DC

## 2014-10-21 NOTE — Telephone Encounter (Signed)
Patient has appt scheduled

## 2014-10-21 NOTE — Telephone Encounter (Signed)
[facility] Josem Kaufmann #[id number] valid 11/11/14-05/10/15 for 6 visits

## 2014-10-27 MED ORDER — SODIUM CHLORIDE 0.9 % IV SOLN
INTRAVENOUS | Status: DC
  Administered 2014-10-28: 07:00:00 via INTRAVENOUS

## 2014-10-28 ENCOUNTER — Encounter (HOSPITAL_COMMUNITY)
Admission: RE | Disposition: A | Discharge status: Home / Self Care | Payer: Self-pay | Source: Ambulatory Visit | Attending: Surgery

## 2014-10-28 ENCOUNTER — Ambulatory Visit (HOSPITAL_COMMUNITY)
Admission: RE | Admit: 2014-10-28 | Discharge: 2014-10-28 | Disposition: A | Discharge status: Home / Self Care | Payer: Commercial Managed Care - HMO | Source: Ambulatory Visit | Attending: Surgery | Admitting: Surgery

## 2014-10-28 ENCOUNTER — Encounter (HOSPITAL_COMMUNITY): Payer: Self-pay | Admitting: Surgery

## 2014-10-28 DIAGNOSIS — E039 Hypothyroidism, unspecified: Secondary | ICD-10-CM | POA: Diagnosis not present

## 2014-10-28 DIAGNOSIS — K5790 Diverticulosis of intestine, part unspecified, without perforation or abscess without bleeding: Secondary | ICD-10-CM | POA: Insufficient documentation

## 2014-10-28 DIAGNOSIS — I7092 Chronic total occlusion of artery of the extremities: Secondary | ICD-10-CM | POA: Insufficient documentation

## 2014-10-28 DIAGNOSIS — T82858A Stenosis of vascular prosthetic devices, implants and grafts, initial encounter: Secondary | ICD-10-CM | POA: Diagnosis not present

## 2014-10-28 DIAGNOSIS — Z79899 Other long term (current) drug therapy: Secondary | ICD-10-CM | POA: Insufficient documentation

## 2014-10-28 DIAGNOSIS — I2510 Atherosclerotic heart disease of native coronary artery without angina pectoris: Secondary | ICD-10-CM | POA: Insufficient documentation

## 2014-10-28 DIAGNOSIS — Z7982 Long term (current) use of aspirin: Secondary | ICD-10-CM | POA: Diagnosis not present

## 2014-10-28 DIAGNOSIS — K219 Gastro-esophageal reflux disease without esophagitis: Secondary | ICD-10-CM | POA: Insufficient documentation

## 2014-10-28 DIAGNOSIS — I700 Atherosclerosis of aorta: Secondary | ICD-10-CM | POA: Insufficient documentation

## 2014-10-28 DIAGNOSIS — G7000 Myasthenia gravis without (acute) exacerbation: Secondary | ICD-10-CM | POA: Diagnosis not present

## 2014-10-28 DIAGNOSIS — I252 Old myocardial infarction: Secondary | ICD-10-CM | POA: Diagnosis not present

## 2014-10-28 DIAGNOSIS — I10 Essential (primary) hypertension: Secondary | ICD-10-CM | POA: Diagnosis not present

## 2014-10-28 DIAGNOSIS — I6523 Occlusion and stenosis of bilateral carotid arteries: Secondary | ICD-10-CM | POA: Insufficient documentation

## 2014-10-28 DIAGNOSIS — Z7902 Long term (current) use of antithrombotics/antiplatelets: Secondary | ICD-10-CM | POA: Diagnosis not present

## 2014-10-28 DIAGNOSIS — K551 Chronic vascular disorders of intestine: Secondary | ICD-10-CM | POA: Diagnosis not present

## 2014-10-28 DIAGNOSIS — E785 Hyperlipidemia, unspecified: Secondary | ICD-10-CM | POA: Diagnosis not present

## 2014-10-28 DIAGNOSIS — J449 Chronic obstructive pulmonary disease, unspecified: Secondary | ICD-10-CM | POA: Diagnosis not present

## 2014-10-28 DIAGNOSIS — Z90710 Acquired absence of both cervix and uterus: Secondary | ICD-10-CM | POA: Insufficient documentation

## 2014-10-28 DIAGNOSIS — F17210 Nicotine dependence, cigarettes, uncomplicated: Secondary | ICD-10-CM | POA: Diagnosis not present

## 2014-10-28 DIAGNOSIS — Y832 Surgical operation with anastomosis, bypass or graft as the cause of abnormal reaction of the patient, or of later complication, without mention of misadventure at the time of the procedure: Secondary | ICD-10-CM | POA: Diagnosis not present

## 2014-10-28 HISTORY — PX: VISCERAL ANGIOGRAM: SHX5515

## 2014-10-28 HISTORY — PX: CAROTID ANGIOGRAM: SHX5504

## 2014-10-28 LAB — POCT I-STAT, CHEM 8
BUN: 25 mg/dL — ABNORMAL HIGH (ref 6–23)
Calcium, Ion: 1.18 mmol/L (ref 1.13–1.30)
Chloride: 102 mmol/L (ref 96–112)
Creatinine, Ser: 0.9 mg/dL (ref 0.50–1.10)
Glucose, Bld: 84 mg/dL (ref 70–99)
HCT: 44 % (ref 36.0–46.0)
Hemoglobin: 15 g/dL (ref 12.0–15.0)
Potassium: 3.9 mmol/L (ref 3.5–5.1)
Sodium: 141 mmol/L (ref 135–145)
TCO2: 30 mmol/L (ref 0–100)

## 2014-10-28 LAB — POCT ACTIVATED CLOTTING TIME
Activated Clotting Time: 220 seconds
Activated Clotting Time: 178 seconds

## 2014-10-28 SURGERY — CAROTID ANGIOGRAM
Anesthesia: LOCAL

## 2014-10-28 MED ORDER — HEPARIN (PORCINE) IN NACL 2-0.9 UNIT/ML-% IJ SOLN
INTRAMUSCULAR | Status: AC
  Filled 2014-10-28: qty 1000

## 2014-10-28 MED ORDER — HEPARIN SODIUM (PORCINE) 1000 UNIT/ML IJ SOLN
INTRAMUSCULAR | Status: AC
  Filled 2014-10-28: qty 1

## 2014-10-28 MED ORDER — ACETAMINOPHEN 325 MG RE SUPP: 325.0000 mg | RECTAL | Status: DC | PRN

## 2014-10-28 MED ORDER — SODIUM CHLORIDE 0.9 % IV SOLN: 1.0000 mL/kg/h | INTRAVENOUS | Status: DC

## 2014-10-28 MED ORDER — GUAIFENESIN-DM 100-10 MG/5ML PO SYRP: 15.0000 mL | ORAL_SOLUTION | ORAL | Status: DC | PRN

## 2014-10-28 MED ORDER — MORPHINE SULFATE 10 MG/ML IJ SOLN: 2.0000 mg | INTRAMUSCULAR | Status: DC | PRN

## 2014-10-28 MED ORDER — DOCUSATE SODIUM 100 MG PO CAPS: 100.0000 mg | ORAL_CAPSULE | Freq: Every day | ORAL | Status: DC

## 2014-10-28 MED ORDER — HYDRALAZINE HCL 20 MG/ML IJ SOLN: 5.0000 mg | INTRAMUSCULAR | Status: DC | PRN

## 2014-10-28 MED ORDER — ALUM & MAG HYDROXIDE-SIMETH 200-200-20 MG/5ML PO SUSP: 15.0000 mL | ORAL | Status: DC | PRN

## 2014-10-28 MED ORDER — METOPROLOL TARTRATE 1 MG/ML IV SOLN: 2.0000 mg | INTRAVENOUS | Status: DC | PRN

## 2014-10-28 MED ORDER — ACETAMINOPHEN 325 MG PO TABS: 325.0000 mg | ORAL_TABLET | ORAL | Status: DC | PRN

## 2014-10-28 MED ORDER — LABETALOL HCL 5 MG/ML IV SOLN: 10.0000 mg | INTRAVENOUS | Status: DC | PRN

## 2014-10-28 MED ORDER — ONDANSETRON HCL 4 MG/2ML IJ SOLN: 4.0000 mg | Freq: Four times a day (QID) | INTRAMUSCULAR | Status: DC | PRN

## 2014-10-28 MED ORDER — LIDOCAINE HCL (PF) 1 % IJ SOLN
INTRAMUSCULAR | Status: AC
  Filled 2014-10-28: qty 30

## 2014-10-28 MED ORDER — PHENOL 1.4 % MT LIQD
1.0000 | OROMUCOSAL | Status: DC | PRN
Start: 2014-10-28 — End: 2014-10-28

## 2014-10-28 MED ORDER — TRAMADOL HCL 50 MG PO TABS: 50.0000 mg | ORAL_TABLET | Freq: Four times a day (QID) | ORAL | Status: DC | PRN

## 2014-10-28 NOTE — Interval H&P Note (Signed)
History and Physical Interval Note:  10/28/2014 8:44 AM  Brandi Raymond  has presented today for surgery, with the diagnosis of TIA's  The various methods of treatment have been discussed with the patient and family. After consideration of risks, benefits and other options for treatment, the patient has consented to  Procedure(s): CAROTID ANGIOGRAM (N/A) VISCERAL ANGIOGRAM (N/A) as a surgical intervention .  The patient's history has been reviewed, patient examined, no change in status, stable for surgery.  I have reviewed the patient's chart and labs.  Questions were answered to the patient's satisfaction.     BRABHAM IV, V. WELLS
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
## 2014-10-28 NOTE — Discharge Instructions (Signed)

## 2014-10-28 NOTE — Progress Notes (Signed)
Site area: rt groin Site Prior to Removal:  Level  0 Pressure Applied For:  25 minutes Manual:   yes Patient Status During Pull:  stable Post Pull Site:  Level 0 w/faint bruising Post Pull Instructions Given:  yes Post Pull Pulses Present: yes Dressing Applied: tegaderm   Bedrest begins @  1125  Comments: no complications

## 2014-10-28 NOTE — Progress Notes (Signed)
Neuro assessment: Alert and oriented x 3; speech clear and appropriate, smile symmetrical, tongue midline; upper and lower extremities equal bilaterally in strength.

## 2014-10-28 NOTE — Op Note (Signed)
Patient name: Brandi Raymond MRN: [id number] DOB: 04-08-1934 Sex: female  10/28/2014 Pre-operative Diagnosis: #1: Bilateral carotid stenosis.  #2: Chronic mesenteric ischemia with in-stent stenosis Post-operative diagnosis:  Same Surgeon:  Eldridge Abrahams Procedure Performed:  1.  Ultrasound-guided access, right femoral artery  2.  Aortic arch angiogram  3.  First order catheterization (innominate artery)  4.  Right carotid angiogram  5.  First order catheterization (left common carotid artery)  6.  Left carotid angiogram  7.  Abdominal aortogram  8.  Angioplasty superior mesenteric artery  Findings: In-stent stenosis, approximately 60%, decreased to less than 10% after intervention with a 520 Angiosculpt   Indications:  The patient was seen for surveillance imaging an ultrasound identified bilateral carotid stenosis and in-stent stenosis in her superior mesenteric artery stent.  The patient does complain of bloating after eating.  Procedure:  The patient was identified in the holding area and taken to room 8.  The patient was then placed supine on the table and prepped and draped in the usual sterile fashion.  A time out was called.  Ultrasound was used to evaluate the right common femoral artery.  It was patent .  A digital ultrasound image was acquired.  A micropuncture needle was used to access the right common femoral artery under ultrasound guidance.  An 018 wire was advanced without resistance and a micropuncture sheath was placed.  The 018 wire was removed and a benson wire was placed.  The micropuncture sheath was exchanged for a 5 french sheath.  A pigtail catheter was advanced into the ascending aorta and an aortic arch angiogram was performed.  Next a Simmons 1 catheter was placed into the innominate artery and images of the right carotid artery were obtained.  The Simmons catheter was then placed into the left common carotid artery and left carotid antrum was performed.   A pigtail catheter was then used to perform an abdominal aortogram in the lateral position.  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  Severe calcification is noted throughout the aortic arch as well as the ostium of the innominate artery and left common carotid artery.  The left subclavian artery is occluded  Right carotid artery: Visualization of the right carotid artery was somewhat limited due to patient movement however a significant stenosis appears to exist at the origin of the internal carotid artery on the order of 75-80 percent.  There is significant calcification.  Left carotid artery: High-grade stenosis approximately 90% of the left carotid artery at the bifurcation  Aortogram:  There did appear to be a pressure gradient across the stent within the superior mesenteric artery, about 30-40 millimeters.  Opacification shows the distal superior mesenteric artery is widely patent.  The distal stent is widely patent ostial stenosis was present proximally 60%   Intervention:  After the above images were acquired, I decided to proceed with intervention.  A IM guide catheter was inserted and the superior mesenteric artery stent was selected.  The patient was fully heparinized.  A [place] core wire was advanced to the stent.  A 5 x 20 Angiosculpt balloon was then inserted and taken to 12 atm.  Completion imaging revealed improved ostial result to less than 10% stenosis.  Catheters and wires were removed.  The patient taken the holding area for sheath pull once her quite relation profile corrects.  Impression:  #1  75-80 percent right internal carotid stenosis with severe calcification  #2  90% left internal carotid stenosis with severe calcification  #3  occluded left subclavian artery  #4  ostial stenosis of the previously placed superior mesenteric artery stent, successfully treated using a 5 x 20 Angiosculpt balloon.   Theotis Burrow, M.D. [facility] [place] Office: [phone] Pager:  [phone]

## 2014-10-29 ENCOUNTER — Other Ambulatory Visit: Payer: Self-pay

## 2014-10-29 ENCOUNTER — Telehealth: Payer: Self-pay | Admitting: Surgery

## 2014-10-29 MED ORDER — CLONAZEPAM 1 MG PO TABS: ORAL_TABLET | ORAL | Status: DC

## 2014-10-29 NOTE — Telephone Encounter (Signed)
Left message for pts daughter, [name]. dpm

## 2014-10-29 NOTE — Telephone Encounter (Signed)
#  15 ,1/2 qhs prn

## 2014-10-29 NOTE — Telephone Encounter (Signed)
Clonazepam has been called to [facility] on [address]

## 2014-10-29 NOTE — Telephone Encounter (Signed)
-----   Message from Mena Goes, RN sent at 10/28/2014 10:37 AM EDT ----- Regarding: Schedule   ----- Message -----    From: Serafina Mitchell, MD    Sent: 10/28/2014   9:58 AM      To: [facility] Charge Pool  10/28/2014:  Surgeon:  Eldridge Abrahams Procedure Performed:  1.  Ultrasound-guided access, right femoral artery  2.  Aortic arch angiogram  3.  First order catheterization (innominate artery)  4.  Right carotid angiogram  5.  First order catheterization (left common carotid artery)  6.  Left carotid angiogram  7.  Abdominal aortogram  8.  Angioplasty superior mesenteric artery   Schedule patient to see me in the office within 23 weeks for discussions of carotid endarterectomy.

## 2014-11-10 ENCOUNTER — Encounter: Payer: Self-pay | Admitting: Surgery

## 2014-11-11 ENCOUNTER — Encounter: Payer: Self-pay | Admitting: Neurology

## 2014-11-11 ENCOUNTER — Ambulatory Visit (INDEPENDENT_AMBULATORY_CARE_PROVIDER_SITE_OTHER): Payer: Commercial Managed Care - HMO | Admitting: Neurology

## 2014-11-11 ENCOUNTER — Telehealth: Payer: Self-pay | Admitting: Surgery

## 2014-11-11 ENCOUNTER — Encounter: Payer: Self-pay | Admitting: Surgery

## 2014-11-11 ENCOUNTER — Ambulatory Visit (INDEPENDENT_AMBULATORY_CARE_PROVIDER_SITE_OTHER): Payer: Commercial Managed Care - HMO | Admitting: Surgery

## 2014-11-11 VITALS — BP 71/55 | HR 79 | Ht 65.0 in | Wt 122.8 lb

## 2014-11-11 VITALS — BP 163/91 | HR 73 | Ht 65.0 in | Wt 122.8 lb

## 2014-11-11 DIAGNOSIS — R413 Other amnesia: Secondary | ICD-10-CM

## 2014-11-11 DIAGNOSIS — I6523 Occlusion and stenosis of bilateral carotid arteries: Secondary | ICD-10-CM | POA: Diagnosis not present

## 2014-11-11 DIAGNOSIS — I951 Orthostatic hypotension: Secondary | ICD-10-CM

## 2014-11-11 DIAGNOSIS — G7000 Myasthenia gravis without (acute) exacerbation: Secondary | ICD-10-CM | POA: Diagnosis not present

## 2014-11-11 MED ORDER — DONEPEZIL HCL 5 MG PO TABS: 5.0000 mg | ORAL_TABLET | Freq: Every day | ORAL | Status: DC

## 2014-11-11 NOTE — Patient Instructions (Signed)
Myasthenia Gravis Myasthenia gravis is a disease that causes muscle weakness throughout the body. The muscles affected are the ones we can control (voluntary muscles). An example of a voluntary muscle is your hand muscles. You can control the muscles to make the hand pick something up. An example of an involuntary muscle is the heart. The heart beats without any direction from you.  Myasthenia Gravis is thought to be an autoimmune disease. That means that normal defenses of the body begin to attack the body. In this case, the immune system begins to attack cells located at the junctions of the muscles and the nerves. Women are affected more often. Women are affected at a younger age than men. Babies born to affected women frequently develop symptoms at an early age. SYMPTOMS Initially in the disease, the facial muscles are affected first. After this, a person may develop droopy eyelids. They may have difficulty controlling facial muscles. They may have problems chewing. Swallowing and speaking may become impaired. The weakness gradually spreads to the arms and legs. It begins to affect breathing. Sometimes, the symptoms lessen or go away without any apparent cause. DIAGNOSIS  Diagnosis can be made with blood tests. Tests such as electromyography may be done to examine the electrical activity in the muscle. An improvement in symptoms after having an anti-cholinesterase drug helps confirm the diagnosis.  TREATMENT  Medicines are usually prescribed as the first treatment. These medicines help, but they do not cure the disease. A plasma cleansing procedure (plasmapheresis) can be used to treat a crisis. It can also be used to prepare a person for surgery. This procedure produces short-term improvement. Some cases are helped by removing the thymus gland. Steroids are used for short-term benefits. Document Released: 10/09/2000 Document Revised: 09/25/2011 Document Reviewed: 09/03/2013 [employer] Patient  Information 2015 [employer]. This information is not intended to replace advice given to you by your health care provider. Make sure you discuss any questions you have with your health care provider.  
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
## 2014-11-11 NOTE — Telephone Encounter (Signed)
Gave patient's daughter time & date of appointment with Dr. Percival Spanish 11/27/14 10:45 am. She verbalized understanding.

## 2014-11-15 HISTORY — PX: EYE SURGERY: SHX253

## 2014-11-19 ENCOUNTER — Other Ambulatory Visit: Payer: Self-pay

## 2014-11-24 ENCOUNTER — Encounter: Payer: Self-pay | Admitting: Cardiology

## 2014-11-24 ENCOUNTER — Other Ambulatory Visit: Payer: Self-pay | Admitting: Internal Medicine

## 2014-11-24 ENCOUNTER — Ambulatory Visit (INDEPENDENT_AMBULATORY_CARE_PROVIDER_SITE_OTHER): Payer: Commercial Managed Care - HMO | Admitting: Cardiology

## 2014-11-24 VITALS — BP 116/62 | HR 87 | Ht 65.0 in | Wt 124.2 lb

## 2014-11-24 DIAGNOSIS — I2510 Atherosclerotic heart disease of native coronary artery without angina pectoris: Secondary | ICD-10-CM

## 2014-11-24 DIAGNOSIS — Z01810 Encounter for preprocedural cardiovascular examination: Secondary | ICD-10-CM | POA: Diagnosis not present

## 2014-11-24 DIAGNOSIS — E785 Hyperlipidemia, unspecified: Secondary | ICD-10-CM

## 2014-11-24 NOTE — Telephone Encounter (Signed)
Okay for Zoloft?
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
## 2014-11-24 NOTE — Telephone Encounter (Signed)
Needs fasting lipids before Pravastatin refill Zoloft OK

## 2014-11-24 NOTE — Patient Instructions (Signed)
Your physician has requested that you have a lexiscan myoview for pre-op clearance. For further information please visit [email or web address]. Please follow instruction sheet, as given.  Your physician recommends that you schedule a follow-up appointment in: One Year.

## 2014-11-27 ENCOUNTER — Ambulatory Visit: Payer: Commercial Managed Care - HMO | Admitting: Cardiology

## 2014-11-30 ENCOUNTER — Telehealth: Payer: Self-pay | Admitting: Neurology

## 2014-11-30 MED ORDER — DONEPEZIL HCL 10 MG PO TABS: 10.0000 mg | ORAL_TABLET | Freq: Every day | ORAL | Status: DC

## 2014-11-30 NOTE — Telephone Encounter (Signed)
Last OV note says: Aricept will be restarted at 5 mg. If she does well with this, she will contact our office after one month, and we will increase the dose to a 10 mg tablet I called the patient back.  Advised Rx will be sent for '10mg'$ , and asked that she call us back if she has any issues tolerating this dose.  She was agreeable to this and will call back if needed.

## 2014-11-30 NOTE — Telephone Encounter (Signed)
Patient called requesting a refill for donepezil (ARICEPT) 5 MG tablet. Patient states that Dr. Jannifer Franklin mentioned going up on the dosage to '10mg'$  if she responded well with the '5mg'$ . She states the '5mg'$  was fine. [facility]acy: [facility] [phone]. Patient can be reached @ [phone]

## 2014-12-01 ENCOUNTER — Other Ambulatory Visit (HOSPITAL_COMMUNITY): Payer: Commercial Managed Care - HMO

## 2014-12-01 ENCOUNTER — Other Ambulatory Visit (INDEPENDENT_AMBULATORY_CARE_PROVIDER_SITE_OTHER): Payer: Commercial Managed Care - HMO

## 2014-12-01 DIAGNOSIS — E785 Hyperlipidemia, unspecified: Secondary | ICD-10-CM

## 2014-12-01 LAB — LIPID PANEL
Cholesterol: 176 mg/dL (ref 0–200)
HDL: 74 mg/dL (ref >39.00)
LDL Cholesterol: 82 mg/dL (ref 0–99)
NonHDL: 102
Total CHOL/HDL Ratio: 2
Triglycerides: 98 mg/dL (ref 0.0–149.0)
VLDL: 19.6 mg/dL (ref 0.0–40.0)

## 2014-12-01 LAB — TSH: TSH: 3.25 u[IU]/mL (ref 0.35–4.50)

## 2014-12-08 ENCOUNTER — Telehealth (HOSPITAL_COMMUNITY): Payer: Self-pay

## 2014-12-08 ENCOUNTER — Encounter: Payer: Self-pay | Admitting: Internal Medicine

## 2014-12-08 ENCOUNTER — Other Ambulatory Visit: Payer: Self-pay | Admitting: *Deleted

## 2014-12-08 ENCOUNTER — Ambulatory Visit (INDEPENDENT_AMBULATORY_CARE_PROVIDER_SITE_OTHER): Payer: Commercial Managed Care - HMO | Admitting: Internal Medicine

## 2014-12-08 VITALS — BP 122/78 | HR 115 | Temp 98.0°F | Wt 123.2 lb

## 2014-12-08 DIAGNOSIS — R269 Unspecified abnormalities of gait and mobility: Secondary | ICD-10-CM

## 2014-12-08 DIAGNOSIS — E038 Other specified hypothyroidism: Secondary | ICD-10-CM | POA: Diagnosis not present

## 2014-12-08 DIAGNOSIS — G479 Sleep disorder, unspecified: Secondary | ICD-10-CM

## 2014-12-08 DIAGNOSIS — E782 Mixed hyperlipidemia: Secondary | ICD-10-CM | POA: Diagnosis not present

## 2014-12-08 MED ORDER — CLONAZEPAM 1 MG PO TABS: ORAL_TABLET | ORAL | Status: DC

## 2014-12-08 NOTE — Patient Instructions (Signed)
To prevent sleep dysfunction follow these instructions for sleep hygiene. Do not read, watch TV, or eat in bed. Do not get into bed until you are ready to turn off the light &  to go to sleep. Do not ingest stimulants ( decongestants, diet pills, nicotine, caffeine) after the evening meal.Do not take daytime naps. Perform isometric exercise of calves  ( while seated go up on toes to count of 5 & then onto heels for 5 count). Repeat  4- 5 times prior to standing if you've been seated or supine for any significant period of time as BP drops with such positions.

## 2014-12-08 NOTE — Progress Notes (Signed)
   Subjective:    Patient ID: Brandi Raymond, female    DOB: [birth year], 81 y.o.   MRN: [id number]  HPI  The patient is here to assess status of active health conditions.  The hospital records 07/06/14 were reviewed. She described a mechanical fall when she tripped and fell against a dresser. Those ER records were reviewed. There was no cardiac or neurologic prodrome prior to the fall. The fall was in the context of taking clonazepam half pill at bedtime for insomnia. She also takes gabapentin 1 during the day and 2 at bedtime at a 300 mg dose. The latter is prescribed by her neurologist for neuropathy.  She has had carotid and mesenteric angiograms. She has a 90% block in the left carotid for which surgery is planned June 30,2016.  She will also need eyelid surgery as ptosis is affecting her vision. This is in the setting of ocular myasthenia.  She quit smoking 4 weeks ago. Her LDL is 82 and HDL 74. TSH is therapeutic.     Review of Systems  Denied were any change in heart rhythm or rate prior to the event. There was no associated chest pain or shortness of breath .  Also specifically denied prior to the episode were headache, limb weakness, tingling, or numbness. No seizure activity noted.     Objective:   Physical Exam  Pertinent or positive findings include:  She is thin but appears adequately nourished.  She has a loud left carotid bruit and a faint right carotid bruit. She has a grade 5/4-5 systolic murmur at the base.  Breath sounds are distant.  She has mixed PIP/DIP changes of the hands.  Pedal pulses are decreased especially the DIP pulses.  She has crepitus of the knees greater on the left than the right.   General appearance : in no distress. Eyes: No conjunctival inflammation or scleral icterus is present. Ptosis bilaterally Oral exam:  Lips and gums are healthy appearing.There is no oropharyngeal erythema or exudate noted. Heart:  Normal rate and regular rhythm.  S1 and S2 normal without gallop,  click, rub or other extra sounds   Lungs:Chest clear to auscultation; no wheezes, rhonchi,rales ,or rubs present.No increased work of breathing.  Abdomen: bowel sounds normal, soft and non-tender without masses, organomegaly or hernias noted.  No guarding or rebound. No flank tenderness to percussion. Vascular : all pulses equal ; no bruits present. Skin:Warm & dry.  Intact without suspicious lesions or rashes ; no tenting or jaundice  Lymphatic: No lymphadenopathy is noted about the head, neck, axilla Neuro: Strength, tone decreased. Gait is surprisingly stable today.        Assessment & Plan:   #1 mechanical fall without cardiac or neurologic prodrome. Risk of taking gabapentin and clonazepam discussed. Isometrics will be recommended if she does get up at night.  #2 severe peripheral vascular disease for which carotid endarterectomy is planned.  #3 dyslipidemia; no change in medication recommended #4 hypothyroidism; TSH therapeutic

## 2014-12-08 NOTE — Progress Notes (Signed)
Pre visit review using our clinic review tool, if applicable. No additional management support is needed unless otherwise documented below in the visit note. 

## 2014-12-08 NOTE — Telephone Encounter (Signed)
Encounter complete. 

## 2014-12-09 ENCOUNTER — Telehealth (HOSPITAL_COMMUNITY): Payer: Self-pay

## 2014-12-09 NOTE — Telephone Encounter (Signed)
Encounter complete. 

## 2014-12-10 ENCOUNTER — Ambulatory Visit (HOSPITAL_COMMUNITY)
Admission: RE | Admit: 2014-12-10 | Discharge: 2014-12-10 | Disposition: A | Discharge status: Home / Self Care | Payer: Commercial Managed Care - HMO | Source: Ambulatory Visit | Attending: Cardiology | Admitting: Cardiology

## 2014-12-10 DIAGNOSIS — I2510 Atherosclerotic heart disease of native coronary artery without angina pectoris: Secondary | ICD-10-CM | POA: Diagnosis not present

## 2014-12-10 DIAGNOSIS — Z01810 Encounter for preprocedural cardiovascular examination: Secondary | ICD-10-CM | POA: Diagnosis not present

## 2014-12-10 LAB — MYOCARDIAL PERFUSION IMAGING
Estimated workload: 1 METS
LV dias vol: 82 mL
LV sys vol: 45 mL
Nuc Stress EF: 45 %
Peak BP: 123 mmHg
Peak HR: 76 {beats}/min
Percent of predicted max HR: 53 %
Rest HR: 57 {beats}/min
SDS: 3
SRS: 2
SSS: 5
Stage 1 Grade: 0 %
Stage 1 HR: 56 {beats}/min
Stage 1 Speed: 0 mph
Stage 2 Grade: 0 %
Stage 2 HR: 57 {beats}/min
Stage 2 Speed: 0 mph
Stage 3 DBP: 71 mmHg
Stage 3 Grade: 0 %
Stage 3 HR: 76 {beats}/min
Stage 3 SBP: 123 mmHg
Stage 3 Speed: 0 mph
Stage 4 Grade: 0 %
Stage 4 HR: 75 {beats}/min
Stage 4 Speed: 0 mph
TID: 1.08

## 2014-12-10 MED ORDER — TECHNETIUM TC 99M SESTAMIBI GENERIC - CARDIOLITE
31.5000 | Freq: Once | INTRAVENOUS | Status: AC | PRN
  Administered 2014-12-10: 32 via INTRAVENOUS

## 2014-12-10 MED ORDER — AMINOPHYLLINE 25 MG/ML IV SOLN
75.0000 mg | Freq: Once | INTRAVENOUS | Status: AC
  Administered 2014-12-10: 75 mg via INTRAVENOUS

## 2014-12-10 MED ORDER — REGADENOSON 0.4 MG/5ML IV SOLN
0.4000 mg | Freq: Once | INTRAVENOUS | Status: AC
  Administered 2014-12-10: 0.4 mg via INTRAVENOUS

## 2014-12-10 MED ORDER — TECHNETIUM TC 99M SESTAMIBI GENERIC - CARDIOLITE
10.2000 | Freq: Once | INTRAVENOUS | Status: AC | PRN
  Administered 2014-12-10: 10 via INTRAVENOUS

## 2014-12-19 ENCOUNTER — Other Ambulatory Visit: Payer: Self-pay | Admitting: Internal Medicine

## 2015-01-04 ENCOUNTER — Ambulatory Visit (INDEPENDENT_AMBULATORY_CARE_PROVIDER_SITE_OTHER): Payer: Commercial Managed Care - HMO | Admitting: Internal Medicine

## 2015-01-04 ENCOUNTER — Encounter: Payer: Self-pay | Admitting: Internal Medicine

## 2015-01-04 VITALS — BP 162/74 | HR 74 | Temp 98.2°F | Resp 22 | Wt 123.0 lb

## 2015-01-04 DIAGNOSIS — J209 Acute bronchitis, unspecified: Secondary | ICD-10-CM

## 2015-01-04 DIAGNOSIS — J438 Other emphysema: Secondary | ICD-10-CM | POA: Diagnosis not present

## 2015-01-04 MED ORDER — AMOXICILLIN-POT CLAVULANATE 500-125 MG PO TABS: ORAL_TABLET | ORAL | Status: DC

## 2015-01-04 MED ORDER — PREDNISONE 10 MG PO TABS: ORAL_TABLET | ORAL | Status: DC

## 2015-01-04 NOTE — Progress Notes (Signed)
   Subjective:    Patient ID: Brandi Raymond, female    DOB: 04/27/1934, 81 y.o.   MRN: [id number]  HPI  In the last 1-2 weeks her chronic cough has progressed and been associated with rhinitis. She has some watery eyes and occasional wheezing. The cough is typically dry for the most part but she will bring up green/yellow sputum on occasion. Her shortness of breath is progressing as well.  She's had some left chest discomfort with use of the left upper extremity. There is no anginal type pain  She also describes diffuse headache but not in the frontal sinus area.  Review of Systems Frontal headache, facial pain , nasal purulence, dental pain, sore throat , otic pain or otic discharge denied. No fever , chills or sweats.  She describes dark, liquid stools alternating with constipation. She had been on a probiotic but stopped it.    Objective:   Physical Exam  Pertinent or positive findings include: There is scarring of the right tympanic membrane inferiorly. She is thin but adequately nourished. She has complete dentures. S4 Cadence is present. Breath sounds are markedly decreased. She is in no distress.  Eyes: No conjunctival inflammation or scleral icterus is present.  Oral exam:  Lips and gums are healthy appearing.There is no oropharyngeal erythema or exudate noted.   Heart:  Normal rate and regular rhythm. S1 and S2 normal without gallop, murmur, click., rub or other extra sounds    Lungs:.No increased work of breathing.   Vascular : all pulses equal ; no bruits present.  Skin:Warm & dry.  Intact without suspicious lesions or rashes ; no tenting or jaundice   Lymphatic: No lymphadenopathy is noted about the head, neck, axilla   Neuro: Strength, tone decreased        Assessment & Plan:  #1 acute bronchitis w/o bronchospasm #2 COPD Plan: See orders and recommendations

## 2015-01-04 NOTE — Patient Instructions (Addendum)
Carry room temperature water and sip liberally after coughing.  Please take a probiotic , Florastor OR Align, every day if the bowels are loose. This will replace the normal bacteria which  are necessary for formation of normal stool and processing of food.

## 2015-01-04 NOTE — Progress Notes (Signed)
Pre visit review using our clinic review tool, if applicable. No additional management support is needed unless otherwise documented below in the visit note. 

## 2015-01-05 ENCOUNTER — Telehealth: Payer: Self-pay | Admitting: Internal Medicine

## 2015-01-05 ENCOUNTER — Other Ambulatory Visit: Payer: Self-pay | Admitting: Emergency Medicine

## 2015-01-05 MED ORDER — CLONAZEPAM 1 MG PO TABS: ORAL_TABLET | ORAL | Status: DC

## 2015-01-05 NOTE — Telephone Encounter (Signed)
Is requesting refill on klonazepam

## 2015-01-06 ENCOUNTER — Encounter (HOSPITAL_COMMUNITY)
Admission: RE | Admit: 2015-01-06 | Discharge: 2015-01-06 | Disposition: A | Discharge status: Home / Self Care | Payer: Commercial Managed Care - HMO | Source: Ambulatory Visit | Attending: Surgery | Admitting: Surgery

## 2015-01-06 ENCOUNTER — Encounter (HOSPITAL_COMMUNITY): Payer: Self-pay

## 2015-01-06 ENCOUNTER — Encounter (HOSPITAL_COMMUNITY)
Admission: RE | Admit: 2015-01-06 | Discharge: 2015-01-06 | Disposition: A | Discharge status: Home / Self Care | Payer: Commercial Managed Care - HMO | Source: Ambulatory Visit | Attending: Anesthesiology | Admitting: Anesthesiology

## 2015-01-06 DIAGNOSIS — Z01812 Encounter for preprocedural laboratory examination: Secondary | ICD-10-CM | POA: Insufficient documentation

## 2015-01-06 DIAGNOSIS — I252 Old myocardial infarction: Secondary | ICD-10-CM | POA: Insufficient documentation

## 2015-01-06 DIAGNOSIS — Z79899 Other long term (current) drug therapy: Secondary | ICD-10-CM | POA: Diagnosis not present

## 2015-01-06 DIAGNOSIS — E039 Hypothyroidism, unspecified: Secondary | ICD-10-CM | POA: Diagnosis not present

## 2015-01-06 DIAGNOSIS — K219 Gastro-esophageal reflux disease without esophagitis: Secondary | ICD-10-CM | POA: Insufficient documentation

## 2015-01-06 DIAGNOSIS — Z01818 Encounter for other preprocedural examination: Secondary | ICD-10-CM | POA: Diagnosis not present

## 2015-01-06 DIAGNOSIS — I10 Essential (primary) hypertension: Secondary | ICD-10-CM | POA: Diagnosis not present

## 2015-01-06 DIAGNOSIS — I2510 Atherosclerotic heart disease of native coronary artery without angina pectoris: Secondary | ICD-10-CM | POA: Diagnosis not present

## 2015-01-06 DIAGNOSIS — E785 Hyperlipidemia, unspecified: Secondary | ICD-10-CM | POA: Diagnosis not present

## 2015-01-06 DIAGNOSIS — Z7902 Long term (current) use of antithrombotics/antiplatelets: Secondary | ICD-10-CM | POA: Insufficient documentation

## 2015-01-06 DIAGNOSIS — I700 Atherosclerosis of aorta: Secondary | ICD-10-CM | POA: Diagnosis not present

## 2015-01-06 DIAGNOSIS — J449 Chronic obstructive pulmonary disease, unspecified: Secondary | ICD-10-CM | POA: Diagnosis not present

## 2015-01-06 DIAGNOSIS — J209 Acute bronchitis, unspecified: Secondary | ICD-10-CM

## 2015-01-06 DIAGNOSIS — G7000 Myasthenia gravis without (acute) exacerbation: Secondary | ICD-10-CM | POA: Diagnosis not present

## 2015-01-06 DIAGNOSIS — I6523 Occlusion and stenosis of bilateral carotid arteries: Secondary | ICD-10-CM | POA: Diagnosis not present

## 2015-01-06 DIAGNOSIS — Z87891 Personal history of nicotine dependence: Secondary | ICD-10-CM | POA: Insufficient documentation

## 2015-01-06 DIAGNOSIS — Z0183 Encounter for blood typing: Secondary | ICD-10-CM | POA: Diagnosis not present

## 2015-01-06 DIAGNOSIS — Z7982 Long term (current) use of aspirin: Secondary | ICD-10-CM | POA: Diagnosis not present

## 2015-01-06 LAB — COMPREHENSIVE METABOLIC PANEL
ALT: 36 U/L (ref 14–54)
AST: 28 U/L (ref 15–41)
Albumin: 3.5 g/dL (ref 3.5–5.0)
Alkaline Phosphatase: 68 U/L (ref 38–126)
Anion gap: 11 (ref 5–15)
BUN: 14 mg/dL (ref 6–20)
CO2: 26 mmol/L (ref 22–32)
Calcium: 9 mg/dL (ref 8.9–10.3)
Chloride: 104 mmol/L (ref 101–111)
Creatinine, Ser: 0.87 mg/dL (ref 0.44–1.00)
GFR calc Af Amer: >60 mL/min (ref >60)
GFR calc non Af Amer: >60 mL/min (ref >60)
Glucose, Bld: 70 mg/dL (ref 65–99)
Potassium: 4 mmol/L (ref 3.5–5.1)
Sodium: 141 mmol/L (ref 135–145)
Total Bilirubin: 0.6 mg/dL (ref 0.3–1.2)
Total Protein: 6.8 g/dL (ref 6.5–8.1)

## 2015-01-06 LAB — URINALYSIS, ROUTINE W REFLEX MICROSCOPIC
Bilirubin Urine: NEGATIVE
Glucose, UA: NEGATIVE mg/dL
Hgb urine dipstick: NEGATIVE
Ketones, ur: NEGATIVE mg/dL
Leukocytes, UA: NEGATIVE
Nitrite: NEGATIVE
Protein, ur: NEGATIVE mg/dL
Specific Gravity, Urine: 1.017 (ref 1.005–1.030)
Urobilinogen, UA: 1 mg/dL (ref 0.0–1.0)
pH: 6 (ref 5.0–8.0)

## 2015-01-06 LAB — CBC
HCT: 38.9 % (ref 36.0–46.0)
Hemoglobin: 12.5 g/dL (ref 12.0–15.0)
MCH: 28.6 pg (ref 26.0–34.0)
MCHC: 32.1 g/dL (ref 30.0–36.0)
MCV: 89 fL (ref 78.0–100.0)
Platelets: 191 10*3/uL (ref 150–400)
RBC: 4.37 MIL/uL (ref 3.87–5.11)
RDW: 13.5 % (ref 11.5–15.5)
WBC: 11.1 10*3/uL — ABNORMAL HIGH (ref 4.0–10.5)

## 2015-01-06 LAB — TYPE AND SCREEN
ABO/RH(D): A NEG
Antibody Screen: NEGATIVE

## 2015-01-06 LAB — SURGICAL PCR SCREEN
MRSA, PCR: NEGATIVE
Staphylococcus aureus: NEGATIVE

## 2015-01-06 LAB — PROTIME-INR
INR: 1.02 (ref 0.00–1.49)
Prothrombin Time: 13.6 seconds (ref 11.6–15.2)

## 2015-01-06 LAB — APTT: aPTT: 30 seconds (ref 24–37)

## 2015-01-06 MED ORDER — CHLORHEXIDINE GLUCONATE 4 % EX LIQD: 60.0000 mL | Freq: Once | CUTANEOUS | Status: DC

## 2015-01-06 NOTE — Progress Notes (Signed)
Pt presents today for PAT with productive cough and states "on antibiotic for bronchitis since 01/04/15", afebrile.  Carol notified in office and will follow up with Dr Trula Slade.  Pt is to continue plavix and aspirin, pt and spouse verbalize understanding.

## 2015-01-06 NOTE — Pre-Procedure Instructions (Signed)
    Brandi Raymond  01/06/2015      [id number] [place] [id number]: [phone]: [phone] [id number]: [phone]: [phone]   Your procedure is scheduled on January 14, 2015.  [facility]  Call this number if you have problems the morning of surgery:  [phone]   Remember:  Do not eat food or drink liquids after midnight.  Take these medicines the morning of surgery with A SIP OF WATER gabapentin (NEURONTIN) , levothyroxine (SYNTHROID, LEVOTHROID) , sertraline (ZOLOFT)   PLAVIX AS DIRECTED   Do not wear jewelry, make-up or nail polish.  Do not wear lotions, powders, or perfumes.  You may NOT wear deodorant.  Do not shave 48 hours prior to surgery.    Do not bring valuables to the hospital.  [facility] or [facility].  Contacts, dentures or bridgework may not be worn into surgery.  Leave your suitcase in the car.  After surgery it may be brought to your room.  For patients admitted to the hospital, discharge time will be determined by your treatment team.  Patients discharged the day of surgery will not be allowed to drive home.   Name and phone number of your driver:    Special instructions:    Please read over the following fact sheets that you were given. Pain Booklet, Coughing and Deep Breathing, Blood Transfusion Information and Surgical Site Infection Prevention

## 2015-01-07 NOTE — Progress Notes (Signed)
Anesthesia Chart Review:  Pt is 81 year old female scheduled for L CEA on 01/14/2015 with Dr. Trula Slade.   Cardiologist is Dr. Percival Spanish, last office visit 11/24/2014  PMH includes: CAD (PTCA RCA 1993; cath in 1996: LAD 20/50, CFX OK, RCA 30 at prev PTCA site, EF with mild HK inferior wall), HTN, MI (1993), myasthenia gravis, COPD, hypothyroidism, anemia, dyslipidemia, mesenteric artery stenosis, GERD. Former smoker (stopped in May 2016). BMI 20.5  Medications include: ASA, plavix, aricept, levothyroxine, lisinopril, pravastatin, prednisone, augmentin. Pt recently started on augmentin and prednisone for bronchitis. Pt to continue ASA and plavix per Dr. Trula Slade.   Preoperative labs reviewed.    Chest x-ray 01/06/2015 reviewed. COPD with chronic scarring in the left posterior costophrenic angle. No acute infiltrate is seen. Aortic atherosclerotic change.  EKG 11/24/2014: sinus rhythm, LBBB, no acute ST-T wave changes per Dr. Percival Spanish.   Nuclear stress test 12/10/2014:  This is a low risk study. Overall left ventricular systolic function was abnormal. LV cavity size is normal. The left ventricular ejection fraction is mildly decreased (45-54%). There is no prior study for comparison. Antero septal scar/focal  Aortic arch, B carotid angiogram; abdominal aortogram; angioplasty superior mesenteric artery 10/28/2014: 1. 75-80 percent right internal carotid stenosis with severe calcification 2. 90% left internal carotid stenosis with severe calcification 3. occluded left subclavian artery 4. ostial stenosis of the previously placed superior mesenteric artery stent, successfully treated using a 5 x 20 Angiosculpt balloon.   Pt has cardiac clearance from Dr. Percival Spanish for surgery.   If no changes, I anticipate pt can proceed with surgery as scheduled.   Willeen Cass, FNP-BC [facility]/Anesthesiology Phone: [phone] 01/07/2015 12:44 PM

## 2015-01-13 MED ORDER — SODIUM CHLORIDE 0.9 % IV SOLN: INTRAVENOUS | Status: DC

## 2015-01-13 MED ORDER — DEXTROSE 5 % IV SOLN
1.5000 g | INTRAVENOUS | Status: AC
  Administered 2015-01-14: 1.5 g via INTRAVENOUS
  Filled 2015-01-13: qty 1.5

## 2015-01-14 ENCOUNTER — Inpatient Hospital Stay (HOSPITAL_COMMUNITY)
Admission: RE | Admit: 2015-01-14 | Discharge: 2015-01-15 | DRG: 039 | Disposition: A | Discharge status: Home / Self Care | Payer: Commercial Managed Care - HMO | Source: Ambulatory Visit | Attending: Surgery | Admitting: Surgery

## 2015-01-14 ENCOUNTER — Encounter (HOSPITAL_COMMUNITY)
Admission: RE | Disposition: A | Discharge status: Home / Self Care | Payer: Self-pay | Source: Ambulatory Visit | Attending: Surgery

## 2015-01-14 ENCOUNTER — Inpatient Hospital Stay (HOSPITAL_COMMUNITY): Payer: Commercial Managed Care - HMO | Admitting: Anesthesiology

## 2015-01-14 ENCOUNTER — Inpatient Hospital Stay (HOSPITAL_COMMUNITY): Payer: Commercial Managed Care - HMO | Admitting: Emergency Medicine

## 2015-01-14 ENCOUNTER — Encounter (HOSPITAL_COMMUNITY): Payer: Self-pay | Admitting: Surgery

## 2015-01-14 DIAGNOSIS — Z888 Allergy status to other drugs, medicaments and biological substances status: Secondary | ICD-10-CM | POA: Diagnosis not present

## 2015-01-14 DIAGNOSIS — I6529 Occlusion and stenosis of unspecified carotid artery: Secondary | ICD-10-CM | POA: Diagnosis present

## 2015-01-14 DIAGNOSIS — I252 Old myocardial infarction: Secondary | ICD-10-CM

## 2015-01-14 DIAGNOSIS — G7000 Myasthenia gravis without (acute) exacerbation: Secondary | ICD-10-CM | POA: Diagnosis present

## 2015-01-14 DIAGNOSIS — D509 Iron deficiency anemia, unspecified: Secondary | ICD-10-CM | POA: Diagnosis present

## 2015-01-14 DIAGNOSIS — E039 Hypothyroidism, unspecified: Secondary | ICD-10-CM | POA: Diagnosis present

## 2015-01-14 DIAGNOSIS — Z9861 Coronary angioplasty status: Secondary | ICD-10-CM

## 2015-01-14 DIAGNOSIS — I6522 Occlusion and stenosis of left carotid artery: Secondary | ICD-10-CM

## 2015-01-14 DIAGNOSIS — E785 Hyperlipidemia, unspecified: Secondary | ICD-10-CM | POA: Diagnosis present

## 2015-01-14 DIAGNOSIS — J449 Chronic obstructive pulmonary disease, unspecified: Secondary | ICD-10-CM | POA: Diagnosis not present

## 2015-01-14 DIAGNOSIS — Z86010 Personal history of colonic polyps: Secondary | ICD-10-CM | POA: Diagnosis not present

## 2015-01-14 DIAGNOSIS — K317 Polyp of stomach and duodenum: Secondary | ICD-10-CM | POA: Diagnosis present

## 2015-01-14 DIAGNOSIS — I6523 Occlusion and stenosis of bilateral carotid arteries: Secondary | ICD-10-CM | POA: Diagnosis not present

## 2015-01-14 DIAGNOSIS — L8990 Pressure ulcer of unspecified site, unspecified stage: Secondary | ICD-10-CM | POA: Insufficient documentation

## 2015-01-14 DIAGNOSIS — Z7982 Long term (current) use of aspirin: Secondary | ICD-10-CM | POA: Diagnosis not present

## 2015-01-14 DIAGNOSIS — H3530 Unspecified macular degeneration: Secondary | ICD-10-CM | POA: Diagnosis present

## 2015-01-14 DIAGNOSIS — Z95820 Peripheral vascular angioplasty status with implants and grafts: Secondary | ICD-10-CM

## 2015-01-14 DIAGNOSIS — Z79899 Other long term (current) drug therapy: Secondary | ICD-10-CM | POA: Diagnosis not present

## 2015-01-14 DIAGNOSIS — K219 Gastro-esophageal reflux disease without esophagitis: Secondary | ICD-10-CM | POA: Diagnosis present

## 2015-01-14 DIAGNOSIS — Z7902 Long term (current) use of antithrombotics/antiplatelets: Secondary | ICD-10-CM

## 2015-01-14 DIAGNOSIS — E780 Pure hypercholesterolemia: Secondary | ICD-10-CM | POA: Diagnosis present

## 2015-01-14 DIAGNOSIS — I10 Essential (primary) hypertension: Secondary | ICD-10-CM | POA: Diagnosis present

## 2015-01-14 DIAGNOSIS — F17210 Nicotine dependence, cigarettes, uncomplicated: Secondary | ICD-10-CM | POA: Diagnosis present

## 2015-01-14 DIAGNOSIS — Z90710 Acquired absence of both cervix and uterus: Secondary | ICD-10-CM | POA: Diagnosis not present

## 2015-01-14 HISTORY — PX: ENDARTERECTOMY: SHX5162

## 2015-01-14 LAB — CBC
HCT: 34.6 % — ABNORMAL LOW (ref 36.0–46.0)
Hemoglobin: 11.2 g/dL — ABNORMAL LOW (ref 12.0–15.0)
MCH: 28.6 pg (ref 26.0–34.0)
MCHC: 32.4 g/dL (ref 30.0–36.0)
MCV: 88.3 fL (ref 78.0–100.0)
Platelets: 216 10*3/uL (ref 150–400)
RBC: 3.92 MIL/uL (ref 3.87–5.11)
RDW: 13.7 % (ref 11.5–15.5)
WBC: 13.3 10*3/uL — ABNORMAL HIGH (ref 4.0–10.5)

## 2015-01-14 LAB — CREATININE, SERUM
Creatinine, Ser: 0.78 mg/dL (ref 0.44–1.00)
GFR calc Af Amer: >60 mL/min (ref >60)
GFR calc non Af Amer: >60 mL/min (ref >60)

## 2015-01-14 LAB — GLUCOSE, CAPILLARY: Glucose-Capillary: 112 mg/dL — ABNORMAL HIGH (ref 65–99)

## 2015-01-14 SURGERY — ENDARTERECTOMY, CAROTID
Anesthesia: General | Site: Neck | Laterality: Left

## 2015-01-14 MED ORDER — LEVOTHYROXINE SODIUM 112 MCG PO TABS
112.0000 ug | ORAL_TABLET | Freq: Every day | ORAL | Status: DC
  Administered 2015-01-15: 112 ug via ORAL
  Filled 2015-01-14 (×2): qty 1

## 2015-01-14 MED ORDER — FENTANYL CITRATE (PF) 100 MCG/2ML IJ SOLN
25.0000 ug | INTRAMUSCULAR | Status: DC | PRN
Start: 2015-01-14 — End: 2015-01-14

## 2015-01-14 MED ORDER — PHENYLEPHRINE 40 MCG/ML (10ML) SYRINGE FOR IV PUSH (FOR BLOOD PRESSURE SUPPORT)
PREFILLED_SYRINGE | INTRAVENOUS | Status: AC
  Filled 2015-01-14: qty 10

## 2015-01-14 MED ORDER — DOPAMINE-DEXTROSE 3.2-5 MG/ML-% IV SOLN
INTRAVENOUS | Status: AC
  Filled 2015-01-14: qty 250

## 2015-01-14 MED ORDER — POLYVINYL ALCOHOL 1.4 % OP SOLN
1.0000 [drp] | OPHTHALMIC | Status: DC | PRN
  Filled 2015-01-14: qty 15

## 2015-01-14 MED ORDER — LIDOCAINE HCL (CARDIAC) 20 MG/ML IV SOLN
INTRAVENOUS | Status: DC | PRN
  Administered 2015-01-14: 60 mg via INTRAVENOUS

## 2015-01-14 MED ORDER — GLYCOPYRROLATE 0.2 MG/ML IJ SOLN
INTRAMUSCULAR | Status: DC | PRN
  Administered 2015-01-14: 0.4 mg via INTRAVENOUS

## 2015-01-14 MED ORDER — LACTATED RINGERS IV SOLN
INTRAVENOUS | Status: DC | PRN
  Administered 2015-01-14 (×2): via INTRAVENOUS

## 2015-01-14 MED ORDER — ALBUTEROL SULFATE HFA 108 (90 BASE) MCG/ACT IN AERS
INHALATION_SPRAY | RESPIRATORY_TRACT | Status: DC | PRN
  Administered 2015-01-14: 2 via RESPIRATORY_TRACT

## 2015-01-14 MED ORDER — ALUM & MAG HYDROXIDE-SIMETH 200-200-20 MG/5ML PO SUSP: 15.0000 mL | ORAL | Status: DC | PRN

## 2015-01-14 MED ORDER — SENNOSIDES-DOCUSATE SODIUM 8.6-50 MG PO TABS
1.0000 | ORAL_TABLET | Freq: Every evening | ORAL | Status: DC | PRN
  Filled 2015-01-14: qty 1

## 2015-01-14 MED ORDER — SODIUM CHLORIDE 0.9 % IR SOLN
Status: DC | PRN
  Administered 2015-01-14: 500 mL

## 2015-01-14 MED ORDER — MAGNESIUM SULFATE 2 GM/50ML IV SOLN: 2.0000 g | Freq: Every day | INTRAVENOUS | Status: DC | PRN

## 2015-01-14 MED ORDER — OXYCODONE HCL 5 MG PO TABS: 5.0000 mg | ORAL_TABLET | Freq: Once | ORAL | Status: DC | PRN

## 2015-01-14 MED ORDER — PROTAMINE SULFATE 10 MG/ML IV SOLN
INTRAVENOUS | Status: AC
  Filled 2015-01-14: qty 5

## 2015-01-14 MED ORDER — GLYCOPYRROLATE 0.2 MG/ML IJ SOLN
INTRAMUSCULAR | Status: AC
  Filled 2015-01-14: qty 2

## 2015-01-14 MED ORDER — DOPAMINE-DEXTROSE 3.2-5 MG/ML-% IV SOLN
3.0000 ug/kg/min | INTRAVENOUS | Status: DC
  Administered 2015-01-14: 3 ug/kg/min via INTRAVENOUS
  Filled 2015-01-14: qty 250

## 2015-01-14 MED ORDER — PHENYLEPHRINE HCL 10 MG/ML IJ SOLN
INTRAMUSCULAR | Status: DC | PRN
  Administered 2015-01-14 (×2): 80 ug via INTRAVENOUS

## 2015-01-14 MED ORDER — SODIUM CHLORIDE 0.9 % IV SOLN
INTRAVENOUS | Status: DC
  Administered 2015-01-14: 100 mL/h via INTRAVENOUS

## 2015-01-14 MED ORDER — FENTANYL CITRATE (PF) 100 MCG/2ML IJ SOLN
INTRAMUSCULAR | Status: DC | PRN
  Administered 2015-01-14: 75 ug via INTRAVENOUS
  Administered 2015-01-14: 25 ug via INTRAVENOUS

## 2015-01-14 MED ORDER — NEOSTIGMINE METHYLSULFATE 10 MG/10ML IV SOLN
INTRAVENOUS | Status: DC | PRN
  Administered 2015-01-14: 3 mg via INTRAVENOUS

## 2015-01-14 MED ORDER — HEPARIN SODIUM (PORCINE) 1000 UNIT/ML IJ SOLN
INTRAMUSCULAR | Status: AC
  Filled 2015-01-14: qty 1

## 2015-01-14 MED ORDER — POLYETHYL GLYCOL-PROPYL GLYCOL 0.4-0.3 % OP GEL: 1.0000 [drp] | Freq: Every day | OPHTHALMIC | Status: DC

## 2015-01-14 MED ORDER — ACETAMINOPHEN 325 MG PO TABS: 325.0000 mg | ORAL_TABLET | ORAL | Status: DC | PRN

## 2015-01-14 MED ORDER — DEXTROSE 5 % IV SOLN
1.5000 g | Freq: Two times a day (BID) | INTRAVENOUS | Status: AC
  Administered 2015-01-14 – 2015-01-15 (×2): 1.5 g via INTRAVENOUS
  Filled 2015-01-14 (×2): qty 1.5

## 2015-01-14 MED ORDER — EPHEDRINE SULFATE 50 MG/ML IJ SOLN
INTRAMUSCULAR | Status: AC
  Filled 2015-01-14: qty 1

## 2015-01-14 MED ORDER — LABETALOL HCL 5 MG/ML IV SOLN
INTRAVENOUS | Status: AC
  Filled 2015-01-14: qty 4

## 2015-01-14 MED ORDER — FENTANYL CITRATE (PF) 250 MCG/5ML IJ SOLN
INTRAMUSCULAR | Status: AC
  Filled 2015-01-14: qty 5

## 2015-01-14 MED ORDER — SODIUM CHLORIDE 0.9 % IV SOLN
Freq: Once | INTRAVENOUS | Status: AC
  Administered 2015-01-14: 500 mL via INTRAVENOUS

## 2015-01-14 MED ORDER — ASPIRIN EC 81 MG PO TBEC
81.0000 mg | DELAYED_RELEASE_TABLET | Freq: Every day | ORAL | Status: DC
  Administered 2015-01-15: 81 mg via ORAL
  Filled 2015-01-14: qty 1

## 2015-01-14 MED ORDER — PROTAMINE SULFATE 10 MG/ML IV SOLN
INTRAVENOUS | Status: DC | PRN
  Administered 2015-01-14: 10 mg via INTRAVENOUS
  Administered 2015-01-14 (×2): 20 mg via INTRAVENOUS

## 2015-01-14 MED ORDER — BISACODYL 10 MG RE SUPP: 10.0000 mg | Freq: Every day | RECTAL | Status: DC | PRN

## 2015-01-14 MED ORDER — PHENYLEPHRINE HCL 10 MG/ML IJ SOLN
10.0000 mg | INTRAVENOUS | Status: DC | PRN
  Administered 2015-01-14: 50 ug/min via INTRAVENOUS

## 2015-01-14 MED ORDER — PROPOFOL 10 MG/ML IV BOLUS
INTRAVENOUS | Status: AC
  Filled 2015-01-14: qty 20

## 2015-01-14 MED ORDER — CLONAZEPAM 0.5 MG PO TABS
0.5000 mg | ORAL_TABLET | Freq: Every day | ORAL | Status: DC
  Administered 2015-01-14: 0.5 mg via ORAL
  Filled 2015-01-14: qty 1

## 2015-01-14 MED ORDER — PHENOL 1.4 % MT LIQD: 1.0000 | OROMUCOSAL | Status: DC | PRN

## 2015-01-14 MED ORDER — LISINOPRIL 5 MG PO TABS
5.0000 mg | ORAL_TABLET | Freq: Two times a day (BID) | ORAL | Status: DC
  Administered 2015-01-14: 5 mg via ORAL
  Filled 2015-01-14 (×4): qty 1

## 2015-01-14 MED ORDER — ACETAMINOPHEN 325 MG RE SUPP
325.0000 mg | RECTAL | Status: DC | PRN
  Filled 2015-01-14: qty 2

## 2015-01-14 MED ORDER — LIDOCAINE HCL (PF) 1 % IJ SOLN
INTRAMUSCULAR | Status: AC
  Filled 2015-01-14: qty 30

## 2015-01-14 MED ORDER — DONEPEZIL HCL 10 MG PO TABS
10.0000 mg | ORAL_TABLET | Freq: Every day | ORAL | Status: DC
  Administered 2015-01-15: 10 mg via ORAL
  Filled 2015-01-14: qty 1

## 2015-01-14 MED ORDER — METOPROLOL TARTRATE 1 MG/ML IV SOLN: 2.0000 mg | INTRAVENOUS | Status: DC | PRN

## 2015-01-14 MED ORDER — SERTRALINE HCL 50 MG PO TABS
50.0000 mg | ORAL_TABLET | Freq: Every day | ORAL | Status: DC
  Administered 2015-01-15: 50 mg via ORAL
  Filled 2015-01-14: qty 1

## 2015-01-14 MED ORDER — GABAPENTIN 300 MG PO CAPS
300.0000 mg | ORAL_CAPSULE | Freq: Two times a day (BID) | ORAL | Status: DC
  Administered 2015-01-15: 300 mg via ORAL
  Filled 2015-01-14 (×3): qty 1

## 2015-01-14 MED ORDER — DEXAMETHASONE SODIUM PHOSPHATE 4 MG/ML IJ SOLN
INTRAMUSCULAR | Status: AC
  Filled 2015-01-14: qty 1

## 2015-01-14 MED ORDER — GABAPENTIN 300 MG PO CAPS
600.0000 mg | ORAL_CAPSULE | Freq: Every day | ORAL | Status: DC
  Administered 2015-01-14: 600 mg via ORAL
  Filled 2015-01-14 (×2): qty 2

## 2015-01-14 MED ORDER — ROCURONIUM BROMIDE 100 MG/10ML IV SOLN
INTRAVENOUS | Status: DC | PRN
  Administered 2015-01-14: 35 mg via INTRAVENOUS

## 2015-01-14 MED ORDER — LABETALOL HCL 5 MG/ML IV SOLN: 10.0000 mg | INTRAVENOUS | Status: DC | PRN

## 2015-01-14 MED ORDER — OXYCODONE-ACETAMINOPHEN 5-325 MG PO TABS
1.0000 | ORAL_TABLET | ORAL | Status: DC | PRN
  Administered 2015-01-14: 1 via ORAL
  Administered 2015-01-15: 2 via ORAL
  Filled 2015-01-14: qty 2
  Filled 2015-01-14: qty 1

## 2015-01-14 MED ORDER — CLOPIDOGREL BISULFATE 75 MG PO TABS
75.0000 mg | ORAL_TABLET | Freq: Every day | ORAL | Status: DC
  Administered 2015-01-15: 75 mg via ORAL
  Filled 2015-01-14 (×2): qty 1

## 2015-01-14 MED ORDER — EPHEDRINE SULFATE 50 MG/ML IJ SOLN
INTRAMUSCULAR | Status: DC | PRN
  Administered 2015-01-14 (×3): 10 mg via INTRAVENOUS

## 2015-01-14 MED ORDER — SODIUM CHLORIDE 0.9 % IV SOLN
500.0000 mL | Freq: Once | INTRAVENOUS | Status: AC | PRN
  Administered 2015-01-14 (×2): 500 mL via INTRAVENOUS

## 2015-01-14 MED ORDER — ONDANSETRON HCL 4 MG/2ML IJ SOLN: 4.0000 mg | Freq: Four times a day (QID) | INTRAMUSCULAR | Status: DC | PRN

## 2015-01-14 MED ORDER — PANTOPRAZOLE SODIUM 40 MG PO TBEC
40.0000 mg | DELAYED_RELEASE_TABLET | Freq: Every day | ORAL | Status: DC
  Administered 2015-01-15: 40 mg via ORAL
  Filled 2015-01-14: qty 1

## 2015-01-14 MED ORDER — GUAIFENESIN-DM 100-10 MG/5ML PO SYRP
15.0000 mL | ORAL_SOLUTION | ORAL | Status: DC | PRN
Start: 2015-01-14 — End: 2015-01-15

## 2015-01-14 MED ORDER — OXYCODONE HCL 5 MG/5ML PO SOLN: 5.0000 mg | Freq: Once | ORAL | Status: DC | PRN

## 2015-01-14 MED ORDER — HEMOSTATIC AGENTS (NO CHARGE) OPTIME
TOPICAL | Status: DC | PRN
  Administered 2015-01-14: 1 via TOPICAL

## 2015-01-14 MED ORDER — 0.9 % SODIUM CHLORIDE (POUR BTL) OPTIME
TOPICAL | Status: DC | PRN
  Administered 2015-01-14: 1000 mL
  Administered 2015-01-14: 2000 mL

## 2015-01-14 MED ORDER — ONDANSETRON HCL 4 MG/2ML IJ SOLN
INTRAMUSCULAR | Status: AC
  Filled 2015-01-14: qty 2

## 2015-01-14 MED ORDER — LABETALOL HCL 5 MG/ML IV SOLN
INTRAVENOUS | Status: DC | PRN
  Administered 2015-01-14: 10 mg via INTRAVENOUS

## 2015-01-14 MED ORDER — PREDNISONE 10 MG PO TABS: 10.0000 mg | ORAL_TABLET | Freq: Three times a day (TID) | ORAL | Status: DC

## 2015-01-14 MED ORDER — ONDANSETRON HCL 4 MG/2ML IJ SOLN
INTRAMUSCULAR | Status: DC | PRN
  Administered 2015-01-14: 4 mg via INTRAVENOUS

## 2015-01-14 MED ORDER — HYDRALAZINE HCL 20 MG/ML IJ SOLN: 5.0000 mg | INTRAMUSCULAR | Status: DC | PRN

## 2015-01-14 MED ORDER — DOCUSATE SODIUM 100 MG PO CAPS
100.0000 mg | ORAL_CAPSULE | Freq: Every day | ORAL | Status: DC
  Administered 2015-01-15: 100 mg via ORAL
  Filled 2015-01-14: qty 1

## 2015-01-14 MED ORDER — ONDANSETRON HCL 4 MG/2ML IJ SOLN: 4.0000 mg | Freq: Once | INTRAMUSCULAR | Status: DC | PRN

## 2015-01-14 MED ORDER — ENOXAPARIN SODIUM 30 MG/0.3ML ~~LOC~~ SOLN
30.0000 mg | SUBCUTANEOUS | Status: DC
  Filled 2015-01-14 (×2): qty 0.3

## 2015-01-14 MED ORDER — PROPOFOL 10 MG/ML IV BOLUS
INTRAVENOUS | Status: DC | PRN
  Administered 2015-01-14: 150 mg via INTRAVENOUS

## 2015-01-14 MED ORDER — PRAVASTATIN SODIUM 40 MG PO TABS
40.0000 mg | ORAL_TABLET | Freq: Every day | ORAL | Status: DC
  Administered 2015-01-14: 40 mg via ORAL
  Filled 2015-01-14 (×2): qty 1

## 2015-01-14 MED ORDER — MORPHINE SULFATE 2 MG/ML IJ SOLN: 2.0000 mg | INTRAMUSCULAR | Status: DC | PRN

## 2015-01-14 MED ORDER — NEOSTIGMINE METHYLSULFATE 10 MG/10ML IV SOLN
INTRAVENOUS | Status: AC
  Filled 2015-01-14: qty 1

## 2015-01-14 MED ORDER — DEXAMETHASONE SODIUM PHOSPHATE 4 MG/ML IJ SOLN
INTRAMUSCULAR | Status: DC | PRN
  Administered 2015-01-14: 4 mg via INTRAVENOUS

## 2015-01-14 MED ORDER — HEPARIN SODIUM (PORCINE) 1000 UNIT/ML IJ SOLN
INTRAMUSCULAR | Status: DC | PRN
  Administered 2015-01-14: 6000 [IU] via INTRAVENOUS

## 2015-01-14 MED ORDER — POTASSIUM CHLORIDE CRYS ER 20 MEQ PO TBCR: 20.0000 meq | EXTENDED_RELEASE_TABLET | Freq: Every day | ORAL | Status: DC | PRN

## 2015-01-14 SURGICAL SUPPLY — 49 items
CANISTER SUCTION 2500CC (MISCELLANEOUS) ×2 IMPLANT
CATH ROBINSON RED A/P 18FR (CATHETERS) ×2 IMPLANT
CATH SUCT 10FR WHISTLE TIP (CATHETERS) ×2 IMPLANT
CLIP TI MEDIUM 6 (CLIP) ×4 IMPLANT
CLIP TI WIDE RED SMALL 6 (CLIP) ×4 IMPLANT
CRADLE DONUT ADULT HEAD (MISCELLANEOUS) ×2 IMPLANT
DRAIN CHANNEL 15F RND FF W/TCR (WOUND CARE) IMPLANT
ELECT REM PT RETURN 9FT ADLT (ELECTROSURGICAL) ×2
ELECTRODE REM PT RTRN 9FT ADLT (ELECTROSURGICAL) ×1 IMPLANT
EVACUATOR SILICONE 100CC (DRAIN) IMPLANT
GAUZE SPONGE 4X4 12PLY STRL (GAUZE/BANDAGES/DRESSINGS) IMPLANT
GLOVE BIO SURGEON STRL SZ 6.5 (GLOVE) ×2 IMPLANT
GLOVE BIOGEL PI IND STRL 6.5 (GLOVE) ×1 IMPLANT
GLOVE BIOGEL PI IND STRL 7.0 (GLOVE) ×1 IMPLANT
GLOVE BIOGEL PI IND STRL 7.5 (GLOVE) ×2 IMPLANT
GLOVE BIOGEL PI INDICATOR 6.5 (GLOVE) ×1
GLOVE BIOGEL PI INDICATOR 7.0 (GLOVE) ×1
GLOVE BIOGEL PI INDICATOR 7.5 (GLOVE) ×2
GLOVE ECLIPSE 7.0 STRL STRAW (GLOVE) ×2 IMPLANT
GLOVE SURG SS PI 7.0 STRL IVOR (GLOVE) ×2 IMPLANT
GLOVE SURG SS PI 7.5 STRL IVOR (GLOVE) ×2 IMPLANT
GOWN STRL REUS W/ TWL LRG LVL3 (GOWN DISPOSABLE) ×3 IMPLANT
GOWN STRL REUS W/ TWL XL LVL3 (GOWN DISPOSABLE) ×1 IMPLANT
GOWN STRL REUS W/TWL LRG LVL3 (GOWN DISPOSABLE) ×3
GOWN STRL REUS W/TWL XL LVL3 (GOWN DISPOSABLE) ×1
HEMOSTAT SNOW SURGICEL 2X4 (HEMOSTASIS) IMPLANT
INSERT FOGARTY SM (MISCELLANEOUS) IMPLANT
KIT BASIN OR (CUSTOM PROCEDURE TRAY) ×2 IMPLANT
KIT ROOM TURNOVER OR (KITS) ×2 IMPLANT
LIQUID BAND (GAUZE/BANDAGES/DRESSINGS) ×2 IMPLANT
NEEDLE HYPO 25GX1X1/2 BEV (NEEDLE) IMPLANT
NS IRRIG 1000ML POUR BTL (IV SOLUTION) ×6 IMPLANT
PACK CAROTID (CUSTOM PROCEDURE TRAY) ×2 IMPLANT
PAD ARMBOARD 7.5X6 YLW CONV (MISCELLANEOUS) ×4 IMPLANT
PATCH VASCULAR VASCU GUARD 1X6 (Vascular Products) ×2 IMPLANT
SHUNT CAROTID BYPASS 10 (VASCULAR PRODUCTS) IMPLANT
SHUNT CAROTID BYPASS 12FRX15.5 (VASCULAR PRODUCTS) IMPLANT
SPONGE INTESTINAL PEANUT (DISPOSABLE) IMPLANT
SUT ETHILON 3 0 PS 1 (SUTURE) IMPLANT
SUT PROLENE 6 0 BV (SUTURE) ×2 IMPLANT
SUT PROLENE 7 0 BV 1 (SUTURE) IMPLANT
SUT PROLENE 7 0 BV1 MDA (SUTURE) ×2 IMPLANT
SUT SILK 3 0 TIES 17X18 (SUTURE)
SUT SILK 3-0 18XBRD TIE BLK (SUTURE) IMPLANT
SUT VIC AB 3-0 SH 27 (SUTURE) ×2
SUT VIC AB 3-0 SH 27X BRD (SUTURE) ×2 IMPLANT
SUT VICRYL 4-0 PS2 18IN ABS (SUTURE) ×2 IMPLANT
SYR CONTROL 10ML LL (SYRINGE) IMPLANT
WATER STERILE IRR 1000ML POUR (IV SOLUTION) ×2 IMPLANT

## 2015-01-14 NOTE — Progress Notes (Signed)
UR COMPLETED  

## 2015-01-14 NOTE — Progress Notes (Signed)
  Day of Surgery Note    Subjective:  States she is tired  Filed Vitals:   01/14/15 1338  BP: 89/47  Pulse: 56  Temp:   Resp: 13    Incisions:   C/d/i  Extremities:  5/5 all extremities Cardiac:  regular Lungs:  Non labored Neuro:  In tact; tongue is midline  Assessment/Plan:  This is a 81 y.o. female who is s/p left carotid endarterectomy  -pt doing well this afternoon and neuro in tact. -soft blood pressure requiring 54mg of dopamine  -awaiting 3 south bed   SGlen Ullin PA-C 01/14/2015 1:47 PM

## 2015-01-14 NOTE — Transfer of Care (Signed)
Immediate Anesthesia Transfer of Care Note  Patient: Brandi Raymond  Procedure(s) Performed: Procedure(s): LEFT CAROTID ENDARTERECTOMY  (Left)  Patient Location: PACU  Anesthesia Type:General  Level of Consciousness: awake and alert   Airway & Oxygen Therapy: Patient Spontanous Breathing and Patient connected to nasal cannula oxygen  Post-op Assessment: Report given to RN and Post -op Vital signs reviewed and stable  Post vital signs: Reviewed and stable  Last Vitals:  Filed Vitals:   01/14/15 0549  BP:   Pulse: 78  Temp:   Resp: 18    Complications: No apparent anesthesia complications
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
## 2015-01-14 NOTE — Care Management Note (Signed)
Case Management Note  Patient Details  Name: Brandi Raymond MRN: [id number] Date of Birth: [birth year]  Subjective/Objective:       Admitted with carotid stenosis, s/p L carotid endarterctomy.            Action/Plan: Return to home when medically stable. CM to f/u with d/c needs.  Expected Discharge Date:  01/15/15               Expected Discharge Plan:  Home/Self Care  In-House Referral:     Discharge planning Services  CM Consult  Post Acute Care Choice:    Choice offered to:     DME Arranged:    DME Agency:     HH Arranged:    HH Agency:     Status of Service:  In process, will continue to follow  Medicare Important Message Given:    Date Medicare IM Given:    Medicare IM give by:    Date Additional Medicare IM Given:    Additional Medicare Important Message give by:     If discussed at [place] of Stay Meetings, dates discussed:    Additional Comments:Kathy Cairrikier (Daughter) [phone]   Sharin Mons, RN 01/14/2015, 7:34 PM

## 2015-01-14 NOTE — Significant Event (Signed)
Patient BP stable-dopamine has been off. Has been sitting in chair since 1630pm. Wanted to remain in chair for dinner. Family at bedside. Uri Covey, Therapist, sports

## 2015-01-14 NOTE — Op Note (Signed)
Patient name: Brandi Raymond MRN: [id number] DOB: 1934/03/07 Sex: female  01/14/2015 Pre-operative Diagnosis: Asymptomatic   left carotid stenosis Post-operative diagnosis:  Same Surgeon:  Annamarie Major Assistants:  Lennie Muckle Procedure:    left carotid Endarterectomy with bovine pericardial patch angioplasty Anesthesia:  General Blood Loss:  See anesthesia record Specimens:  Carotid Plaque to pathology  Findings:  90 %stenosis; Thrombus:  none  Indications:  The patient has bilateral carotid stenosis.  Recent angiogram revealed > 90% left carotid stenosis.  She denies focal symptoms, however she has been experiencing dizziness  Procedure:  The patient was identified in the holding area and taken to [address] [address]  The patient was then placed supine on the table.   General endotrachial anesthesia was administered.  The patient was prepped and draped in the usual sterile fashion.  A time out was called and antibiotics were administered.  The incision was made along the anterior border of the left sternocleidomastoid muscle.  Cautery was used to dissect through the subcutaneous tissue.  The platysma muscle was divided with cautery.  The internal jugular vein was exposed along its anterior medial border.  The common facial vein was exposed and then divided between 2-0 silk ties and metal clips.  The common carotid artery was then circumferentially exposed and encircled with an umbilical tape.  The vagus nerve was identified and protected.  Next sharp dissection was used to expose the external carotid artery and the superior thyroid artery.  The were encircled with a blue vessel loop and a 2-0 silk tie respectively.  Finally, the internal carotid was carefully dissected free.  An umbilical tape was placed around the internal carotid artery distal to the diseased segment.  The hypoglossal nerve was visualized throughout and protected.  The patient was given systemic heparinization.  A bovine carotid  patch was selected and prepared on the back table.  A 10 french shunt was also prepared.  After blood pressure readings were appropriate and the heparin had been given time to circulate, the internal carotid artery was occluded with a baby Gregory clamp.  The external and common carotid arteries were then occluded with vascular clamps and the 2-0 tie tightened on the superior thyroid artery.  A #11 blade was used to make an arteriotomy in the common carotid artery.  This was extended with Potts scissors along the anterior and lateral border of the common and internal carotid artery.  Approximately 90% stenosis was identified.  There was no thrombus identified.  The 10 french shunt was not placed as there was excellent backbleeding.  A kleiner kuntz elevator was used to perform endarterectomy.  An eversion endarterectomy was performed in the external carotid artery.  A good distal endpoint was obtained in the internal carotid artery.  The specimen was removed and sent to pathology.  Heparinized saline was used to irrigate the endarterectomized field.  All potential embolic debris was removed.  Bovine pericardial patch angioplasty was then performed using a running 6-0 Prolene.  The common internal and external carotid arteries were all appropriately flushed. The artery was again irrigated with heparin saline.  The anastomosis was then secured. The clamp was first released on the external carotid artery followed by the common carotid artery approximately 30 seconds later, bloodflow was reestablish through the internal carotid artery.  Next, a hand-held  Doppler was used to evaluate the signals in the common, external, and internal  carotid arteries, all of which had appropriate signals. I then  administered  50 mg protamine. The wound was then irrigated.  After hemostasis was achieved, the carotid sheath was reapproximated with 3-0 Vicryl. The  platysma muscle was reapproximated with running 3-0 Vicryl. The skin    was closed with 4-0 Vicryl. Dermabond was placed on the skin. The  patient was then successfully extubated. Her neurologic exam was  similar to his preprocedural exam. The patient was then taken to recovery room  in stable condition. There were no complications.     Disposition:  To PACU in stable condition.  Relevant Operative Details:  Normal anatomy.  Focal 90% calcified plaque at the bifurcation.  No shunt used as there was excellent back-bleeding from the internal carotid artery  V. Annamarie Major, M.D. [facility] [place] Office: [phone] Pager:  [phone]

## 2015-01-14 NOTE — Anesthesia Procedure Notes (Signed)
Procedure Name: Intubation Date/Time: 01/14/2015 7:57 AM Performed by: Manus Gunning, Melaine Mcphee J Pre-anesthesia Checklist: Patient identified, Timeout performed, Emergency Drugs available, Suction available and Patient being monitored Patient Re-evaluated:Patient Re-evaluated prior to inductionOxygen Delivery Method: Circle system utilized Preoxygenation: Pre-oxygenation with 100% oxygen Intubation Type: IV induction Ventilation: Mask ventilation without difficulty Laryngoscope Size: Mac and 3 Grade View: Grade I Tube type: Oral Tube size: 7.0 mm Number of attempts: 1 Placement Confirmation: ETT inserted through vocal cords under direct vision,  breath sounds checked- equal and bilateral and positive ETCO2 Secured at: 22 cm Tube secured with: Tape Dental Injury: Teeth and Oropharynx as per pre-operative assessment

## 2015-01-14 NOTE — Anesthesia Postprocedure Evaluation (Signed)
  Anesthesia Post-op Note  Patient: Brandi Raymond  Procedure(s) Performed: Procedure(s): LEFT CAROTID ENDARTERECTOMY  (Left)  Patient Location: PACU  Anesthesia Type:General  Level of Consciousness: awake, alert  and oriented  Airway and Oxygen Therapy: Patient Spontanous Breathing and Patient connected to nasal cannula oxygen  Post-op Pain: mild  Post-op Assessment: Post-op Vital signs reviewed, Patient's Cardiovascular Status Stable, Respiratory Function Stable, Patent Airway and Pain level controlled              Post-op Vital Signs: stable  Last Vitals:  Filed Vitals:   01/14/15 1600  BP: 97/81  Pulse: 57  Temp: 36.9 C  Resp: 19    Complications: No apparent anesthesia complications

## 2015-01-14 NOTE — Anesthesia Preprocedure Evaluation (Addendum)
Anesthesia Evaluation  Patient identified by MRN, date of birth, ID band Patient awake    Reviewed: Allergy & Precautions, NPO status , Patient's Chart, lab work & pertinent test results  Airway Mallampati: II  TM Distance: >3 FB Neck ROM: Full    Dental  (+) Edentulous Upper, Edentulous Lower   Pulmonary former smoker,  breath sounds clear to auscultation        Cardiovascular hypertension, Rhythm:Regular Rate:Normal     Neuro/Psych    GI/Hepatic   Endo/Other    Renal/GU      Musculoskeletal   Abdominal (+) + obese,   Peds  Hematology   Anesthesia Other Findings   Reproductive/Obstetrics                            Anesthesia Physical Anesthesia Plan  ASA: III  Anesthesia Plan: General   Post-op Pain Management:    Induction: Intravenous  Airway Management Planned: Oral ETT  Additional Equipment: Arterial line  Intra-op Plan:   Post-operative Plan: Extubation in OR  Informed Consent: I have reviewed the patients History and Physical, chart, labs and discussed the procedure including the risks, benefits and alternatives for the proposed anesthesia with the patient or authorized representative who has indicated his/her understanding and acceptance.     Plan Discussed with: CRNA and Anesthesiologist  Anesthesia Plan Comments:         Anesthesia Quick Evaluation

## 2015-01-15 ENCOUNTER — Encounter (HOSPITAL_COMMUNITY): Payer: Self-pay | Admitting: Surgery

## 2015-01-15 DIAGNOSIS — L8990 Pressure ulcer of unspecified site, unspecified stage: Secondary | ICD-10-CM | POA: Insufficient documentation

## 2015-01-15 MED ORDER — OXYCODONE-ACETAMINOPHEN 5-325 MG PO TABS: 1.0000 | ORAL_TABLET | Freq: Four times a day (QID) | ORAL | Status: DC | PRN

## 2015-01-15 NOTE — Progress Notes (Addendum)
[facility] [place]  Subjective  - Doing well.  Walking, voided and taking POs well.   Objective 104/43 52 97.8 F (36.6 C) (Oral) 16 92%  Intake/Output Summary (Last 24 hours) at 01/15/15 0713 Last data filed at 01/15/15 0630  Gross per 24 hour  Intake 4830.55 ml  Output   1875 ml  Net 2955.55 ml    Heart RRR Lungs non labored breathing Palpable pulse right radial, grip 5/5 Incision C/D/I No tongue deviation and smile is symmetric  Assessment/Planning: POD # 1Left CEA Disposition stable  Discharge home f/u in 2 weeks with Dr. Fae Pippin, [facility] 01/15/2015 7:13 AM --  Laboratory Lab Results:  Recent Labs  01/14/15 1740  WBC 13.3*  HGB 11.2*  HCT 34.6*  PLT 216   BMET  Recent Labs  01/14/15 1740  CREATININE 0.78    COAG Lab Results  Component Value Date   INR 1.02 01/06/2015   INR 0.9 01/07/2009   No results found for: PTT    Neuro intact stable for d/c  Wells Arlo Butt

## 2015-01-15 NOTE — Progress Notes (Signed)
Pt being discharged home via wheelchair with family. Pt alert and oriented x4. VSS. Pt c/o no pain at this time. No signs of respiratory distress. Education complete and care plans resolved. IV and A-line removed with catheter intact and pt tolerated well. No further issues at this time. Pt to follow up with PCP. Leanne Chang, RN

## 2015-01-19 NOTE — Discharge Summary (Signed)
[facility] Discharge Summary   Patient ID:  Brandi Raymond MRN: [id number] DOB/AGE: [birth year] 79 y.o.  Admit date: 01/14/2015 Discharge date: 01/16/2015 Date of Surgery: 01/14/2015 Surgeon: Surgeon(s): Serafina Mitchell, MD  Admission Diagnosis: Left Internal Carotid Artery Stenosis  I65.22  Discharge Diagnoses:  Left Internal Carotid Artery Stenosis  I65.22  Secondary Diagnoses: Past Medical History  Diagnosis Date  . COPD (chronic obstructive pulmonary disease)   . Hypothyroidism     affecting the left eye, proptosis  . CAD (coronary artery disease)     Dr Percival Spanish  . HTN (hypertension)   . Iron deficiency anemia   . Myocardial infarction 1993  . Ocular myasthenia gravis     Dr Jannifer Franklin  . Syncope 1998  . Diverticulosis   . GERD (gastroesophageal reflux disease) 09/15/1991    Dr Sharlett Iles  . Hyperplastic polyps of stomach 11/2007    colonoscopy  . HLD (hyperlipidemia)   . Cervical spine fracture   . Hiatal hernia 09/15/1991  . Gastritis 09/15/1991  . Adenomatous colon polyp   . Myasthenia gravis     With ocular features  . Dyslipidemia   . Hypertension   . Memory loss   . Hip fracture, right   . Acute MI inferior subsequent episode care 1993    PTCA RCA  . Orthostatic hypotension 06/05/2013  . Mesenteric artery stenosis   . Macular degeneration of left eye     Procedure(s): LEFT CAROTID ENDARTERECTOMY   Discharged Condition: good  HPI: For followup of her mesenteric stenosis. She had a 6 x 15 balloon expandable stent placed in her proximal superior mesenteric artery on 12/03/2012. This was done from a right brachial approach. I previously 100 when attempting this procedure from the groin, however I could not get a stable platform to get a stent into her artery. Post procedure, she did have an aortic dissection which is been treated medically. She states that she continues to have abdominal pain but it is better than prior to stent placement.  She has not noted any significant changes since her stent procedure. She recently found to have elevated velocities and underwent angiography. This was on 10/28/2014. It was done for a femoral approach. Her stent was dilated. I also imaged her carotid arteries and found 75-80 percent right carotid stenosis and 90% left carotid stenosis. The patient is asymptomatic. Specifically she denies numbness or weakness in either extremity. She denies slurred speech. She denies amaurosis fugax.  She suffers from COPD. She is a current smoker. Her hypercholesterolemia is managed with a statin. She is on ACE inhibitor for hypertension.  The patient is scheduled to go to the beach in early June and would like to schedule this for after her vacation. Therefore, June 30 was selected. She is on aspirin and Plavix which I will not discontinue. I will ask Dr. Percival Spanish for cardiology clearance   Hospital Course:  Brandi Raymond is a 81 y.o. female is S/P Procedure(s): LEFT CAROTID ENDARTERECTOMY  POD# 1 Doing well. Walking, voided and taking POs well.  Disposition stable discharge home f/u in 2 weeks.  Significant Diagnostic Studies: CBC Lab Results  Component Value Date   WBC 13.3* 01/14/2015   HGB 11.2* 01/14/2015   HCT 34.6* 01/14/2015   MCV 88.3 01/14/2015   PLT 216 01/14/2015    BMET    Component Value Date/Time   NA 141 01/06/2015 0951   K 4.0 01/06/2015 0951   CL 104 01/06/2015 0951  CO2 26 01/06/2015 0951   GLUCOSE 70 01/06/2015 0951   BUN 14 01/06/2015 0951   CREATININE 0.78 01/14/2015 1740   CALCIUM 9.0 01/06/2015 0951   GFRNONAA >60 01/14/2015 1740   GFRAA >60 01/14/2015 1740   COAG Lab Results  Component Value Date   INR 1.02 01/06/2015   INR 0.9 01/07/2009     Disposition:  Discharge to :Home Discharge Instructions    Call MD for:  redness, tenderness, or signs of infection (pain, swelling, bleeding, redness, odor or green/yellow discharge around incision  site)    Complete by:  As directed      Call MD for:  severe or increased pain, loss or decreased feeling  in affected limb(s)    Complete by:  As directed      Call MD for:  temperature >100.5    Complete by:  As directed      Discharge instructions    Complete by:  As directed   You may shower daily 24 hours from surgery     Discharge patient    Complete by:  As directed   Discharge pt to home     Driving Restrictions    Complete by:  As directed   No driving for 2 weeks     Increase activity slowly    Complete by:  As directed   Walk with assistance use walker or cane as needed     Lifting restrictions    Complete by:  As directed   No lifting for 6 weeks     Resume previous diet    Complete by:  As directed             Medication List    TAKE these medications        amoxicillin-clavulanate 500-125 MG per tablet  Commonly known as:  AUGMENTIN  1 q 12 hrs after a meal     aspirin 81 MG tablet  Take 81 mg by mouth daily.     CALCIUM-VITAMIN D PO  Take 1 tablet by mouth 2 (two) times daily.     CENTRUM SILVER ADULT 50+ PO  Take 1 tablet by mouth daily.     clonazePAM 1 MG tablet  Commonly known as:  KLONOPIN  TAKE 1/2 TABLET BY MOUTH AT BEDTIME AS NEEDED FOR ANXIETY     clopidogrel 75 MG tablet  Commonly known as:  PLAVIX  Take 1 tablet (75 mg total) by mouth daily.     donepezil 10 MG tablet  Commonly known as:  ARICEPT  Take 1 tablet (10 mg total) by mouth daily.     gabapentin 300 MG capsule  Commonly known as:  NEURONTIN  TAKE ONE CAPSULE TWICE DAILY AND 2 AT NIGHT = FOUR TOTAL DAILY.     levothyroxine 112 MCG tablet  Commonly known as:  SYNTHROID, LEVOTHROID  TAKE 1 TABLET EVERY DAY EXCEPT TAKE 1/2 TABLET ON TUESDAY AND THURSDAY     lisinopril 5 MG tablet  Commonly known as:  PRINIVIL,ZESTRIL  TAKE 1 TABLET TWICE DAILY     oxyCODONE-acetaminophen 5-325 MG per tablet  Commonly known as:  PERCOCET/ROXICET  Take 1 tablet by mouth every 6 (six)  hours as needed for moderate pain.     pravastatin 40 MG tablet  Commonly known as:  PRAVACHOL  TAKE 1 TABLET EVERY DAY     predniSONE 10 MG tablet  Commonly known as:  DELTASONE  1 tid pc     sertraline 50 MG  tablet  Commonly known as:  ZOLOFT  TAKE 1 TABLET EVERY DAY     SYSTANE 0.4-0.3 % Gel  Generic drug:  Polyethyl Glycol-Propyl Glycol  Place 1 drop into both eyes daily.       Verbal and written Discharge instructions given to the patient. Wound care per Discharge AVS     Follow-up Information    Follow up with Annamarie Major, MD In 2 weeks.   Specialties:  Vascular Surgery, Cardiology   Why:  sent message to office   Contact information:   [place] [address] [phone]       Signed: Laurence Slate [facility] 01/19/2015, 12:52 PM  --- For VQI Registry use --- Instructions: Press F2 to tab through selections.  Delete question if not applicable.   Modified Rankin score at D/C (0-6): Rankin Score=0  IV medication needed for:  1. Hypertension: Yes 2. Hypotension: Yes  Post-op Complications: No  1. Post-op CVA or TIA: No  If yes: Event classification (right eye, left eye, right cortical, left cortical, verterobasilar, other):   If yes: Timing of event (intra-op, <6 hrs post-op, >=6 hrs post-op, unknown):   2. CN injury: No  If yes: CN  injuried   3. Myocardial infarction: No  If yes: Dx by (EKG or clinical, Troponin):   4.  CHF: No  5.  Dysrhythmia (new): No  6. Wound infection: No  7. Reperfusion symptoms: No  8. Return to OR: No  If yes: return to OR for (bleeding, neurologic, other CEA incision, other):   Discharge medications: Statin use:  Yes ASA use:  Yes Beta blocker use:  No  for medical reason   ACE-Inhibitor use:  Yes P2Y12 Antagonist use: '[ ]'$  None, '[ ]'$  Plavix, '[ ]'$  Plasugrel, '[ ]'$  Ticlopinine, '[ ]'$  Ticagrelor, '[ ]'$  Other, '[ ]'$  No for medical reason, '[ ]'$  Non-compliant, '[ ]'$  Not-indicated Anti-coagulant use:  '[ ]'$  None, [  ] Warfarin, '[ ]'$  Rivaroxaban, '[ ]'$  Dabigatran, '[ ]'$  Other, '[ ]'$  No for medical reason, '[ ]'$  Non-compliant, '[ ]'$  Not-indicated

## 2015-01-20 ENCOUNTER — Telehealth: Payer: Self-pay | Admitting: Surgery

## 2015-01-20 NOTE — Telephone Encounter (Addendum)
-----   Message from Mena Goes, RN sent at 01/14/2015 11:36 AM EDT ----- Regarding: Schedule   ----- Message -----    From: Ulyses Amor, PA-C    Sent: 01/14/2015  11:06 AM      To: [facility]vs Charge Pool  S/P left CEA f/u with Dr. Trula Slade in 2 weeks  notified patient of post op appt. on 01-29-15 at 3:15 pm

## 2015-01-27 ENCOUNTER — Encounter: Payer: Self-pay | Admitting: Surgery

## 2015-01-29 ENCOUNTER — Ambulatory Visit (INDEPENDENT_AMBULATORY_CARE_PROVIDER_SITE_OTHER): Payer: Self-pay | Admitting: Surgery

## 2015-01-29 ENCOUNTER — Encounter: Payer: Self-pay | Admitting: Surgery

## 2015-01-29 VITALS — BP 101/67 | HR 76 | Resp 14 | Ht 65.0 in | Wt 123.0 lb

## 2015-01-29 DIAGNOSIS — I6523 Occlusion and stenosis of bilateral carotid arteries: Secondary | ICD-10-CM

## 2015-01-29 DIAGNOSIS — Z9889 Other specified postprocedural states: Secondary | ICD-10-CM
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
## 2015-02-01 ENCOUNTER — Encounter: Payer: Commercial Managed Care - HMO | Admitting: Surgery

## 2015-02-01 NOTE — Addendum Note (Signed)
Addended by: Dorthula Rue L on: 02/01/2015 11:07 AM   Modules accepted: Orders

## 2015-02-11 ENCOUNTER — Other Ambulatory Visit: Payer: Self-pay | Admitting: Emergency Medicine

## 2015-02-11 ENCOUNTER — Telehealth: Payer: Self-pay | Admitting: Emergency Medicine

## 2015-02-11 MED ORDER — CLONAZEPAM 1 MG PO TABS: ORAL_TABLET | ORAL | Status: DC

## 2015-02-11 NOTE — Telephone Encounter (Signed)
OK X1  My retirement date is 07/17/2015; but I will be in office on a limited schedule Oct-Dec. To guarantee continuity of care you should transition your care to another PCP by Oct 1,2016.     

## 2015-02-11 NOTE — Telephone Encounter (Signed)
Refill request for Clonazepam, last OV 6/16, last refill 01/05/15. Please advise

## 2015-02-19 ENCOUNTER — Encounter: Payer: Self-pay | Admitting: Gastroenterology

## 2015-02-24 ENCOUNTER — Other Ambulatory Visit: Payer: Self-pay | Admitting: Emergency Medicine

## 2015-02-24 ENCOUNTER — Telehealth: Payer: Self-pay | Admitting: Internal Medicine

## 2015-02-24 MED ORDER — LEVOTHYROXINE SODIUM 112 MCG PO TABS: ORAL_TABLET | ORAL | Status: DC

## 2015-02-24 NOTE — Telephone Encounter (Signed)
LVM stating that refill request has been sent in.

## 2015-02-24 NOTE — Telephone Encounter (Signed)
Pt received a call from [facility] saying that her prescription for levothyroxine (SYNTHROID, LEVOTHROID) 112 MCG tablet [[id number]] was denied. Please advise

## 2015-03-15 ENCOUNTER — Other Ambulatory Visit: Payer: Self-pay | Admitting: Internal Medicine

## 2015-03-23 ENCOUNTER — Other Ambulatory Visit: Payer: Self-pay | Admitting: Emergency Medicine

## 2015-03-23 ENCOUNTER — Telehealth: Payer: Self-pay | Admitting: Emergency Medicine

## 2015-03-23 MED ORDER — CLONAZEPAM 1 MG PO TABS: ORAL_TABLET | ORAL | Status: DC

## 2015-03-23 NOTE — Telephone Encounter (Signed)
Clonazepam faxed to [facility]

## 2015-03-23 NOTE — Telephone Encounter (Signed)
OK X1  My retirement date is 07/17/2015; but I will be in office on a limited schedule Oct-Dec. To guarantee continuity of care you should transition your care to another PCP by Oct 1,2016.     

## 2015-03-23 NOTE — Telephone Encounter (Signed)
Refill request for Clonazepam, last OV 01/04/15. Last refill 02/11/15 #15 . Please advise

## 2015-03-26 ENCOUNTER — Other Ambulatory Visit: Payer: Self-pay | Admitting: Internal Medicine

## 2015-04-26 ENCOUNTER — Other Ambulatory Visit: Payer: Self-pay

## 2015-04-26 MED ORDER — GABAPENTIN 300 MG PO CAPS: ORAL_CAPSULE | ORAL | Status: DC

## 2015-05-03 ENCOUNTER — Other Ambulatory Visit: Payer: Self-pay | Admitting: Emergency Medicine

## 2015-05-03 MED ORDER — CLONAZEPAM 1 MG PO TABS: ORAL_TABLET | ORAL | Status: DC

## 2015-05-07 ENCOUNTER — Telehealth: Payer: Self-pay | Admitting: *Deleted

## 2015-05-07 NOTE — Telephone Encounter (Signed)
Left msg on triage stating pharmacy did not get refill on her clonazepam. Called [facility] spoke with Estill Bamberg verified if rx was received on 05/03/15 for clonazepam. Estill Bamberg states thye did not receive refill. Gave md approval verbally. Called pt back inform her [facility] was [facility].../lmb

## 2015-05-28 ENCOUNTER — Other Ambulatory Visit: Payer: Self-pay

## 2015-05-28 DIAGNOSIS — K551 Chronic vascular disorders of intestine: Secondary | ICD-10-CM

## 2015-05-28 MED ORDER — PRAVASTATIN SODIUM 40 MG PO TABS: 40.0000 mg | ORAL_TABLET | Freq: Every day | ORAL | Status: DC

## 2015-05-28 MED ORDER — SERTRALINE HCL 50 MG PO TABS: 50.0000 mg | ORAL_TABLET | Freq: Every day | ORAL | Status: DC

## 2015-05-28 MED ORDER — CLOPIDOGREL BISULFATE 75 MG PO TABS: 75.0000 mg | ORAL_TABLET | Freq: Every day | ORAL | Status: DC

## 2015-05-31 ENCOUNTER — Other Ambulatory Visit: Payer: Self-pay

## 2015-05-31 MED ORDER — DONEPEZIL HCL 10 MG PO TABS: 10.0000 mg | ORAL_TABLET | Freq: Every day | ORAL | Status: DC

## 2015-06-03 ENCOUNTER — Other Ambulatory Visit: Payer: Self-pay | Admitting: Internal Medicine

## 2015-06-03 ENCOUNTER — Other Ambulatory Visit: Payer: Self-pay | Admitting: Emergency Medicine

## 2015-06-03 MED ORDER — CLONAZEPAM 1 MG PO TABS: 0.5000 mg | ORAL_TABLET | Freq: Every evening | ORAL | Status: DC | PRN

## 2015-06-03 MED ORDER — LEVOTHYROXINE SODIUM 112 MCG PO TABS: ORAL_TABLET | ORAL | Status: DC

## 2015-06-03 NOTE — Addendum Note (Signed)
Addended by: Aviva Signs M on: 06/03/2015 03:30 PM   Modules accepted: Orders

## 2015-06-03 NOTE — Telephone Encounter (Signed)
#  15  1/2 qhs prn only

## 2015-06-07 ENCOUNTER — Other Ambulatory Visit: Payer: Self-pay | Admitting: Internal Medicine

## 2015-06-08 ENCOUNTER — Other Ambulatory Visit: Payer: Self-pay | Admitting: Internal Medicine

## 2015-06-11 ENCOUNTER — Encounter: Payer: Self-pay | Admitting: Internal Medicine

## 2015-06-17 ENCOUNTER — Other Ambulatory Visit: Payer: Self-pay | Admitting: Internal Medicine

## 2015-06-17 NOTE — Telephone Encounter (Signed)
OK if not too early Needs new PCP

## 2015-06-18 NOTE — Telephone Encounter (Signed)
Called pharmacy to verify if rx was received back on 06/03/15 for clonazepam. Spoke with pharmacist Minette Brine she stated no they did not received. Last 1 was filled 05/07/15. Gave verbal for md authorization for 11/17...[name]

## 2015-06-23 ENCOUNTER — Encounter: Payer: Self-pay | Admitting: Internal Medicine

## 2015-06-23 ENCOUNTER — Ambulatory Visit (INDEPENDENT_AMBULATORY_CARE_PROVIDER_SITE_OTHER): Payer: Commercial Managed Care - HMO | Admitting: Internal Medicine

## 2015-06-23 ENCOUNTER — Other Ambulatory Visit (INDEPENDENT_AMBULATORY_CARE_PROVIDER_SITE_OTHER): Payer: Commercial Managed Care - HMO

## 2015-06-23 VITALS — BP 138/88 | HR 90 | Temp 97.7°F | Resp 18 | Wt 122.0 lb

## 2015-06-23 DIAGNOSIS — E039 Hypothyroidism, unspecified: Secondary | ICD-10-CM

## 2015-06-23 DIAGNOSIS — G609 Hereditary and idiopathic neuropathy, unspecified: Secondary | ICD-10-CM

## 2015-06-23 DIAGNOSIS — R7303 Prediabetes: Secondary | ICD-10-CM

## 2015-06-23 DIAGNOSIS — Z23 Encounter for immunization: Secondary | ICD-10-CM

## 2015-06-23 DIAGNOSIS — F329 Major depressive disorder, single episode, unspecified: Secondary | ICD-10-CM

## 2015-06-23 DIAGNOSIS — M8580 Other specified disorders of bone density and structure, unspecified site: Secondary | ICD-10-CM | POA: Diagnosis not present

## 2015-06-23 DIAGNOSIS — F32A Depression, unspecified: Secondary | ICD-10-CM

## 2015-06-23 DIAGNOSIS — Z0000 Encounter for general adult medical examination without abnormal findings: Secondary | ICD-10-CM

## 2015-06-23 DIAGNOSIS — E782 Mixed hyperlipidemia: Secondary | ICD-10-CM

## 2015-06-23 DIAGNOSIS — I10 Essential (primary) hypertension: Secondary | ICD-10-CM

## 2015-06-23 LAB — CBC WITH DIFFERENTIAL/PLATELET
Basophils Absolute: 0 10*3/uL (ref 0.0–0.1)
Basophils Relative: 0.9 % (ref 0.0–3.0)
Eosinophils Absolute: 0.1 10*3/uL (ref 0.0–0.7)
Eosinophils Relative: 2 % (ref 0.0–5.0)
HCT: 41.2 % (ref 36.0–46.0)
Hemoglobin: 13.4 g/dL (ref 12.0–15.0)
Lymphocytes Relative: 26.8 % (ref 12.0–46.0)
Lymphs Abs: 1.4 10*3/uL (ref 0.7–4.0)
MCHC: 32.4 g/dL (ref 30.0–36.0)
MCV: 86.6 fl (ref 78.0–100.0)
Monocytes Absolute: 0.6 10*3/uL (ref 0.1–1.0)
Monocytes Relative: 11.2 % (ref 3.0–12.0)
Neutro Abs: 3.2 10*3/uL (ref 1.4–7.7)
Neutrophils Relative %: 59.1 % (ref 43.0–77.0)
Platelets: 172 10*3/uL (ref 150.0–400.0)
RBC: 4.76 Mil/uL (ref 3.87–5.11)
RDW: 14.9 % (ref 11.5–15.5)
WBC: 5.3 10*3/uL (ref 4.0–10.5)

## 2015-06-23 LAB — COMPREHENSIVE METABOLIC PANEL
ALT: 13 U/L (ref 0–35)
AST: 24 U/L (ref 0–37)
Albumin: 3.8 g/dL (ref 3.5–5.2)
Alkaline Phosphatase: 56 U/L (ref 39–117)
BUN: 16 mg/dL (ref 6–23)
CO2: 30 mEq/L (ref 19–32)
Calcium: 9 mg/dL (ref 8.4–10.5)
Chloride: 105 mEq/L (ref 96–112)
Creatinine, Ser: 0.83 mg/dL (ref 0.40–1.20)
GFR: 70.32 mL/min (ref >60.00)
Glucose, Bld: 92 mg/dL (ref 70–99)
Potassium: 4.8 mEq/L (ref 3.5–5.1)
Sodium: 142 mEq/L (ref 135–145)
Total Bilirubin: 0.5 mg/dL (ref 0.2–1.2)
Total Protein: 6.4 g/dL (ref 6.0–8.3)

## 2015-06-23 LAB — LIPID PANEL
Cholesterol: 190 mg/dL (ref 0–200)
HDL: 86.5 mg/dL (ref >39.00)
LDL Cholesterol: 90 mg/dL (ref 0–99)
NonHDL: 103.77
Total CHOL/HDL Ratio: 2
Triglycerides: 68 mg/dL (ref 0.0–149.0)
VLDL: 13.6 mg/dL (ref 0.0–40.0)

## 2015-06-23 LAB — HEMOGLOBIN A1C: Hgb A1c MFr Bld: 5.9 % (ref 4.6–6.5)

## 2015-06-23 LAB — TSH: TSH: 2.21 u[IU]/mL (ref 0.35–4.50)

## 2015-06-23 MED ORDER — SERTRALINE HCL 100 MG PO TABS: 100.0000 mg | ORAL_TABLET | Freq: Every day | ORAL | Status: DC

## 2015-06-23 NOTE — Assessment & Plan Note (Signed)
BP Readings from Last 3 Encounters:  06/23/15 138/88  01/29/15 101/67  01/15/15 96/50   blood pressure controlled Continue current medication

## 2015-06-23 NOTE — Assessment & Plan Note (Signed)
Check lipid panel On pravastatin 40 mg daily
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
## 2015-06-23 NOTE — Patient Instructions (Signed)
  We have reviewed your prior records including labs and tests today.  Test(s) ordered today. Your results will be released to [place] (or called to you) after review, usually within 72hours after test completion. If any changes need to be made, you will be notified at that same time.  All other Health Maintenance issues reviewed.   All recommended immunizations and age-appropriate screenings are up-to-date/discussed.  Flu vaccine administered today.   Work on quitting smoking.  Medications reviewed and updated.  No changes recommended at this time.   Please schedule followup in 6 months

## 2015-06-23 NOTE — Assessment & Plan Note (Signed)
DEXA ordered Encouraged regular exercise Currently not taking any supplements

## 2015-06-23 NOTE — Assessment & Plan Note (Signed)
On sertraline 50 mg daily Depression not controlled Increase sertraline to 100 mg daily

## 2015-06-23 NOTE — Assessment & Plan Note (Signed)
We'll check TSH We'll titrate medication if necessary

## 2015-06-23 NOTE — Progress Notes (Signed)
Pre visit review using our clinic review tool, if applicable. No additional management support is needed unless otherwise documented below in the visit note. 

## 2015-06-24 ENCOUNTER — Encounter: Payer: Self-pay | Admitting: Internal Medicine

## 2015-07-20 ENCOUNTER — Other Ambulatory Visit: Payer: Self-pay | Admitting: Internal Medicine

## 2015-07-20 NOTE — Telephone Encounter (Signed)
Please advise. Pt seen on 06/23/15

## 2015-07-21 ENCOUNTER — Encounter: Payer: Self-pay | Admitting: Neurology

## 2015-07-21 ENCOUNTER — Ambulatory Visit (INDEPENDENT_AMBULATORY_CARE_PROVIDER_SITE_OTHER): Payer: PPO | Admitting: Neurology

## 2015-07-21 VITALS — BP 129/68 | HR 78 | Ht 65.0 in | Wt 119.0 lb

## 2015-07-21 DIAGNOSIS — G7000 Myasthenia gravis without (acute) exacerbation: Secondary | ICD-10-CM

## 2015-07-21 DIAGNOSIS — R413 Other amnesia: Secondary | ICD-10-CM

## 2015-07-21 MED ORDER — DONEPEZIL HCL 10 MG PO TABS: 10.0000 mg | ORAL_TABLET | Freq: Every day | ORAL | Status: DC

## 2015-07-21 NOTE — Patient Instructions (Signed)
Myasthenia Gravis Myasthenia gravis (MG) means severe weakness. It is a long-term (chronic) condition that causes weakness in the muscles you can control (voluntary muscles). MG can affect any voluntary muscle. The muscles most often affected are the ones that control:   Eye movement.  Facial movements.  Swallowing. MG is an autoimmune disease, which means that your body's defense system (immune system) attacks healthy parts of your body instead of germs and other things that make you sick. When you have MG, your immune system makes proteins (antibodies) that block the chemical (acetylcholine) your body needs to send nerve signals to your muscles. This causes muscle weakness. CAUSES  The exact cause of MG is unknown. One possible cause is an enlarged thymus gland, which is located under your breastbone.  SIGNS AND SYMPTOMS The earliest symptom of MG is muscle weakness that gets worse with activity and gets better after rest. Other symptoms of MG may include:  Drooping eyelids.  Double vision.  Loss of facial expression.  Trouble chewing and swallowing.  Slurred speech.  A waddling walk.  Weakness of the arms, hands, and legs. Trouble breathing is the most dangerous symptom of MG. Sudden and severe difficulty breathing (myasthenic crisis) may require emergency breathing support. This symptom sometimes happens after:   Infection.  Fever.  Drug reaction. DIAGNOSIS  It can be hard to diagnose MG because muscle weakness is a common symptom in many conditions. Your health care provider will do a physical exam. You may also have tests that will help make a diagnosis. These may include:  A blood test.  A test using the medicine edrophonium. This medicine increases muscle strength by slowing the breakdown of acetylcholine.  Tests to measure nerve conduction to muscle (electromyography).  An imaging study of the chest (CT or MRI). TREATMENT  Treatment can improve muscle strength.  Sometimes symptoms of MG go away for a while (remission) and you can stop treatment. Possible treatments include:  Medicine.  Removal of the thymus gland (thymectomy). This may result in a long remission for some people. HOME CARE INSTRUCTIONS  Take medicines only as directed by your health care provider.  Get plenty of rest to conserve your energy.  Take frequent breaks to rest your eyes.  Maintain a healthy diet and a healthy weight.  Do not use any tobacco products including cigarettes, chewing tobacco, or electronic cigarettes. If you need help quitting, ask your health care provider.  Keep all follow-up visits as directed by your health care provider. This is important. SEEK MEDICAL CARE IF:  Your symptoms get worse after a fever or infection.  You have a reaction to a medicine you are taking.  Your symptoms change or get worse. SEEK IMMEDIATE MEDICAL CARE IF: You have trouble breathing.    This information is not intended to replace advice given to you by your health care provider. Make sure you discuss any questions you have with your health care provider.   Document Released: 10/09/2000 Document Revised: 07/24/2014 Document Reviewed: 09/03/2013 Elsevier Interactive Patient Education [employer].
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
## 2015-07-22 ENCOUNTER — Other Ambulatory Visit: Payer: Self-pay | Admitting: Internal Medicine

## 2015-07-22 NOTE — Telephone Encounter (Signed)
RX faxed to pharm

## 2015-07-23 ENCOUNTER — Other Ambulatory Visit: Payer: Self-pay | Admitting: Internal Medicine

## 2015-07-23 ENCOUNTER — Encounter: Payer: Self-pay | Admitting: Surgery

## 2015-07-23 DIAGNOSIS — Z961 Presence of intraocular lens: Secondary | ICD-10-CM | POA: Diagnosis not present

## 2015-07-23 DIAGNOSIS — H5201 Hypermetropia, right eye: Secondary | ICD-10-CM | POA: Diagnosis not present

## 2015-07-23 DIAGNOSIS — H4912 Fourth [trochlear] nerve palsy, left eye: Secondary | ICD-10-CM | POA: Diagnosis not present

## 2015-07-23 MED ORDER — CLONAZEPAM 1 MG PO TABS: 0.5000 mg | ORAL_TABLET | Freq: Every day | ORAL | Status: DC

## 2015-08-02 ENCOUNTER — Other Ambulatory Visit: Payer: Self-pay

## 2015-08-02 ENCOUNTER — Ambulatory Visit (HOSPITAL_COMMUNITY)
Admission: RE | Admit: 2015-08-02 | Discharge: 2015-08-02 | Disposition: A | Discharge status: Home / Self Care | Payer: PPO | Source: Ambulatory Visit | Attending: Surgery | Admitting: Surgery

## 2015-08-02 ENCOUNTER — Encounter (HOSPITAL_COMMUNITY): Payer: Commercial Managed Care - HMO

## 2015-08-02 ENCOUNTER — Ambulatory Visit: Payer: Commercial Managed Care - HMO | Admitting: Surgery

## 2015-08-02 ENCOUNTER — Ambulatory Visit (INDEPENDENT_AMBULATORY_CARE_PROVIDER_SITE_OTHER): Payer: PPO | Admitting: Surgery

## 2015-08-02 ENCOUNTER — Ambulatory Visit (INDEPENDENT_AMBULATORY_CARE_PROVIDER_SITE_OTHER)
Admission: RE | Admit: 2015-08-02 | Discharge: 2015-08-02 | Disposition: A | Discharge status: Home / Self Care | Payer: PPO | Source: Ambulatory Visit | Attending: Surgery | Admitting: Surgery

## 2015-08-02 ENCOUNTER — Encounter: Payer: Self-pay | Admitting: Surgery

## 2015-08-02 ENCOUNTER — Other Ambulatory Visit: Payer: Self-pay | Admitting: Surgery

## 2015-08-02 VITALS — BP 72/48 | HR 16 | Temp 98.1°F | Resp 16 | Ht 64.0 in | Wt 117.0 lb

## 2015-08-02 DIAGNOSIS — Z9889 Other specified postprocedural states: Secondary | ICD-10-CM | POA: Diagnosis not present

## 2015-08-02 DIAGNOSIS — I771 Stricture of artery: Secondary | ICD-10-CM

## 2015-08-02 DIAGNOSIS — Z48812 Encounter for surgical aftercare following surgery on the circulatory system: Secondary | ICD-10-CM | POA: Diagnosis not present

## 2015-08-02 DIAGNOSIS — I6523 Occlusion and stenosis of bilateral carotid arteries: Secondary | ICD-10-CM

## 2015-08-02 DIAGNOSIS — K551 Chronic vascular disorders of intestine: Secondary | ICD-10-CM
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
## 2015-08-02 NOTE — Patient Instructions (Signed)
Please review the tobacco cessation information given to you today. It lists many hints that are useful in your effort to stop smoking. The [place] [facility]essation contact phone # is [phone] These nurses and advisors offer lots of FREE information and aids to help you quit.    The [place] Quit Smoking line #  ([phone], they will also assist you with programs designed to help you stop smoking.    Smoking Cessation, Tips for Success If you are ready to quit smoking, congratulations! You have chosen to help yourself be healthier. Cigarettes bring nicotine, tar, carbon monoxide, and other irritants into your body. Your lungs, heart, and blood vessels will be able to work better without these poisons. There are many different ways to quit smoking. Nicotine gum, nicotine patches, a nicotine inhaler, or nicotine nasal spray can help with physical craving. Hypnosis, support groups, and medicines help break the habit of smoking. WHAT THINGS CAN I DO TO MAKE QUITTING EASIER?  Here are some tips to help you quit for good:  Pick a date when you will quit smoking completely. Tell all of your friends and family about your plan to quit on that date.  Do not try to slowly cut down on the number of cigarettes you are smoking. Pick a quit date and quit smoking completely starting on that day.  Throw away all cigarettes.   Clean and remove all ashtrays from your home, work, and car.  On a card, write down your reasons for quitting. Carry the card with you and read it when you get the urge to smoke.  Cleanse your body of nicotine. Drink enough water and fluids to keep your urine clear or pale yellow. Do this after quitting to flush the nicotine from your body.  Learn to predict your moods. Do not let a bad situation be your excuse to have a cigarette. Some situations in your life might tempt you into wanting a cigarette.  Never have "just one" cigarette. It leads to wanting another and another. Remind  yourself of your decision to quit.  Change habits associated with smoking. If you smoked while driving or when feeling stressed, try other activities to replace smoking. Stand up when drinking your coffee. Brush your teeth after eating. Sit in a different chair when you read the paper. Avoid alcohol while trying to quit, and try to drink fewer caffeinated beverages. Alcohol and caffeine may urge you to smoke.  Avoid foods and drinks that can trigger a desire to smoke, such as sugary or spicy foods and alcohol.  Ask people who smoke not to smoke around you.  Have something planned to do right after eating or having a cup of coffee. For example, plan to take a walk or exercise.  Try a relaxation exercise to calm you down and decrease your stress. Remember, you may be tense and nervous for the first 2 weeks after you quit, but this will pass.  Find new activities to keep your hands busy. Play with a pen, coin, or rubber band. Doodle or draw things on paper.  Brush your teeth right after eating. This will help cut down on the craving for the taste of tobacco after meals. You can also try mouthwash.   Use oral substitutes in place of cigarettes. Try using lemon drops, carrots, cinnamon sticks, or chewing gum. Keep them handy so they are available when you have the urge to smoke.  When you have the urge to smoke, try deep breathing.  Designate  your home as a nonsmoking area.  If you are a heavy smoker, ask your health care provider about a prescription for nicotine chewing gum. It can ease your withdrawal from nicotine.  Reward yourself. Set aside the cigarette money you save and buy yourself something nice.  Look for support from others. Join a support group or smoking cessation program. Ask someone at home or at work to help you with your plan to quit smoking.  Always ask yourself, "Do I need this cigarette or is this just a reflex?" Tell yourself, "Today, I choose not to smoke," or "I do  not want to smoke." You are reminding yourself of your decision to quit.  Do not replace cigarette smoking with electronic cigarettes (commonly called e-cigarettes). The safety of e-cigarettes is unknown, and some may contain harmful chemicals.  If you relapse, do not give up! Plan ahead and think about what you will do the next time you get the urge to smoke. HOW WILL I FEEL WHEN I QUIT SMOKING? You may have symptoms of withdrawal because your body is used to nicotine (the addictive substance in cigarettes). You may crave cigarettes, be irritable, feel very hungry, cough often, get headaches, or have difficulty concentrating. The withdrawal symptoms are only temporary. They are strongest when you first quit but will go away within 10-14 days. When withdrawal symptoms occur, stay in control. Think about your reasons for quitting. Remind yourself that these are signs that your body is healing and getting used to being without cigarettes. Remember that withdrawal symptoms are easier to treat than the major diseases that smoking can cause.  Even after the withdrawal is over, expect periodic urges to smoke. However, these cravings are generally short lived and will go away whether you smoke or not. Do not smoke! WHAT RESOURCES ARE AVAILABLE TO HELP ME QUIT SMOKING? Your health care provider can direct you to community resources or hospitals for support, which may include:  Group support.  Education.  Hypnosis.  Therapy.   This information is not intended to replace advice given to you by your health care provider. Make sure you discuss any questions you have with your health care provider.   Document Released: 03/31/2004 Document Revised: 07/24/2014 Document Reviewed: 12/19/2012 Elsevier Interactive Patient Education [employer].

## 2015-08-02 NOTE — Addendum Note (Signed)
Addended by: Dorthula Rue L on: 08/02/2015 04:40 PM   Modules accepted: Orders

## 2015-08-04 DIAGNOSIS — H61123 Hematoma of pinna, bilateral: Secondary | ICD-10-CM | POA: Diagnosis not present

## 2015-08-04 DIAGNOSIS — R490 Dysphonia: Secondary | ICD-10-CM | POA: Diagnosis not present

## 2015-08-04 DIAGNOSIS — H6123 Impacted cerumen, bilateral: Secondary | ICD-10-CM | POA: Diagnosis not present

## 2015-08-05 ENCOUNTER — Telehealth: Payer: Self-pay | Admitting: *Deleted

## 2015-08-05 NOTE — Telephone Encounter (Signed)
Verbal per Susie at Dr. Pollie Friar office; patient has been cleared for CEA for ENT standpoint. Dr. Pollie Friar full note from 08-04-15 appt will be faxed to [place] asap. Per Susie, it takes approximately 3 days for transcription to be completed and sent out.

## 2015-08-09 ENCOUNTER — Other Ambulatory Visit (HOSPITAL_COMMUNITY): Payer: Self-pay | Admitting: *Deleted

## 2015-08-10 ENCOUNTER — Other Ambulatory Visit (HOSPITAL_COMMUNITY): Payer: Self-pay | Admitting: *Deleted

## 2015-08-10 ENCOUNTER — Encounter (HOSPITAL_COMMUNITY): Payer: Self-pay

## 2015-08-10 ENCOUNTER — Encounter (HOSPITAL_COMMUNITY)
Admission: RE | Admit: 2015-08-10 | Discharge: 2015-08-10 | Disposition: A | Discharge status: Home / Self Care | Payer: PPO | Source: Ambulatory Visit | Attending: Surgery | Admitting: Surgery

## 2015-08-10 DIAGNOSIS — I10 Essential (primary) hypertension: Secondary | ICD-10-CM | POA: Diagnosis not present

## 2015-08-10 DIAGNOSIS — M8580 Other specified disorders of bone density and structure, unspecified site: Secondary | ICD-10-CM | POA: Diagnosis not present

## 2015-08-10 DIAGNOSIS — I6521 Occlusion and stenosis of right carotid artery: Secondary | ICD-10-CM | POA: Diagnosis not present

## 2015-08-10 DIAGNOSIS — D509 Iron deficiency anemia, unspecified: Secondary | ICD-10-CM | POA: Diagnosis not present

## 2015-08-10 DIAGNOSIS — Z23 Encounter for immunization: Secondary | ICD-10-CM | POA: Diagnosis not present

## 2015-08-10 DIAGNOSIS — K589 Irritable bowel syndrome without diarrhea: Secondary | ICD-10-CM | POA: Diagnosis not present

## 2015-08-10 DIAGNOSIS — Z9841 Cataract extraction status, right eye: Secondary | ICD-10-CM | POA: Diagnosis not present

## 2015-08-10 DIAGNOSIS — Z9842 Cataract extraction status, left eye: Secondary | ICD-10-CM | POA: Diagnosis not present

## 2015-08-10 DIAGNOSIS — Z7902 Long term (current) use of antithrombotics/antiplatelets: Secondary | ICD-10-CM | POA: Diagnosis not present

## 2015-08-10 DIAGNOSIS — E785 Hyperlipidemia, unspecified: Secondary | ICD-10-CM | POA: Diagnosis not present

## 2015-08-10 DIAGNOSIS — J449 Chronic obstructive pulmonary disease, unspecified: Secondary | ICD-10-CM | POA: Diagnosis not present

## 2015-08-10 DIAGNOSIS — I739 Peripheral vascular disease, unspecified: Secondary | ICD-10-CM | POA: Diagnosis not present

## 2015-08-10 DIAGNOSIS — K219 Gastro-esophageal reflux disease without esophagitis: Secondary | ICD-10-CM | POA: Diagnosis not present

## 2015-08-10 DIAGNOSIS — F329 Major depressive disorder, single episode, unspecified: Secondary | ICD-10-CM | POA: Diagnosis not present

## 2015-08-10 DIAGNOSIS — R7303 Prediabetes: Secondary | ICD-10-CM | POA: Diagnosis not present

## 2015-08-10 DIAGNOSIS — I252 Old myocardial infarction: Secondary | ICD-10-CM | POA: Diagnosis not present

## 2015-08-10 DIAGNOSIS — M1990 Unspecified osteoarthritis, unspecified site: Secondary | ICD-10-CM | POA: Diagnosis not present

## 2015-08-10 DIAGNOSIS — Z7982 Long term (current) use of aspirin: Secondary | ICD-10-CM | POA: Diagnosis not present

## 2015-08-10 DIAGNOSIS — I2510 Atherosclerotic heart disease of native coronary artery without angina pectoris: Secondary | ICD-10-CM | POA: Diagnosis not present

## 2015-08-10 DIAGNOSIS — E039 Hypothyroidism, unspecified: Secondary | ICD-10-CM | POA: Diagnosis not present

## 2015-08-10 DIAGNOSIS — Z79899 Other long term (current) drug therapy: Secondary | ICD-10-CM | POA: Diagnosis not present

## 2015-08-10 DIAGNOSIS — F17210 Nicotine dependence, cigarettes, uncomplicated: Secondary | ICD-10-CM | POA: Diagnosis not present

## 2015-08-10 DIAGNOSIS — I712 Thoracic aortic aneurysm, without rupture: Secondary | ICD-10-CM | POA: Diagnosis not present

## 2015-08-10 HISTORY — DX: Unspecified osteoarthritis, unspecified site: M19.90

## 2015-08-10 HISTORY — DX: Reserved for inherently not codable concepts without codable children: IMO0001

## 2015-08-10 HISTORY — DX: Major depressive disorder, single episode, unspecified: F32.9

## 2015-08-10 HISTORY — DX: Depression, unspecified: F32.A

## 2015-08-10 HISTORY — DX: Pneumonia, unspecified organism: J18.9

## 2015-08-10 LAB — COMPREHENSIVE METABOLIC PANEL
ALT: 15 U/L (ref 14–54)
AST: 28 U/L (ref 15–41)
Albumin: 3.8 g/dL (ref 3.5–5.0)
Alkaline Phosphatase: 56 U/L (ref 38–126)
Anion gap: 12 (ref 5–15)
BUN: 16 mg/dL (ref 6–20)
CO2: 27 mmol/L (ref 22–32)
Calcium: 9.1 mg/dL (ref 8.9–10.3)
Chloride: 104 mmol/L (ref 101–111)
Creatinine, Ser: 1.3 mg/dL — ABNORMAL HIGH (ref 0.44–1.00)
GFR calc Af Amer: 44 mL/min — ABNORMAL LOW (ref >60)
GFR calc non Af Amer: 38 mL/min — ABNORMAL LOW (ref >60)
Glucose, Bld: 100 mg/dL — ABNORMAL HIGH (ref 65–99)
Potassium: 4.7 mmol/L (ref 3.5–5.1)
Sodium: 143 mmol/L (ref 135–145)
Total Bilirubin: 0.5 mg/dL (ref 0.3–1.2)
Total Protein: 6.4 g/dL — ABNORMAL LOW (ref 6.5–8.1)

## 2015-08-10 LAB — CBC
HCT: 41.7 % (ref 36.0–46.0)
Hemoglobin: 13.4 g/dL (ref 12.0–15.0)
MCH: 28.9 pg (ref 26.0–34.0)
MCHC: 32.1 g/dL (ref 30.0–36.0)
MCV: 90.1 fL (ref 78.0–100.0)
Platelets: 153 10*3/uL (ref 150–400)
RBC: 4.63 MIL/uL (ref 3.87–5.11)
RDW: 13.3 % (ref 11.5–15.5)
WBC: 5.8 10*3/uL (ref 4.0–10.5)

## 2015-08-10 LAB — TYPE AND SCREEN
ABO/RH(D): A NEG
Antibody Screen: NEGATIVE

## 2015-08-10 LAB — URINE MICROSCOPIC-ADD ON

## 2015-08-10 LAB — SURGICAL PCR SCREEN
MRSA, PCR: NEGATIVE
Staphylococcus aureus: NEGATIVE

## 2015-08-10 LAB — URINALYSIS, ROUTINE W REFLEX MICROSCOPIC
Bilirubin Urine: NEGATIVE
Glucose, UA: NEGATIVE mg/dL
Hgb urine dipstick: NEGATIVE
Ketones, ur: NEGATIVE mg/dL
Nitrite: NEGATIVE
Protein, ur: NEGATIVE mg/dL
Specific Gravity, Urine: 1.014 (ref 1.005–1.030)
pH: 6 (ref 5.0–8.0)

## 2015-08-10 LAB — PROTIME-INR
INR: 0.99 (ref 0.00–1.49)
Prothrombin Time: 13.3 seconds (ref 11.6–15.2)

## 2015-08-10 LAB — APTT: aPTT: 33 seconds (ref 24–37)

## 2015-08-10 NOTE — Progress Notes (Signed)
Anesthesia Chart Review:  Pt is an 82 year old female scheduled for R CEA on 08/12/2015 with Dr. Trula Slade.   Cardiologist is Dr. Percival Spanish, last office visit 11/24/2014  PMH includes: CAD (PTCA RCA 1993; cath in 1996: LAD 20/50, CFX OK, RCA 30 at prev PTCA site, EF with mild HK inferior wall), HTN, MI (1993), ocular myasthenia gravis, COPD, hypothyroidism, anemia, dyslipidemia, mesenteric artery stenosis (s/p proximal superior mesenteric artery stent 2014, patent per [place] 08/02/15), GERD. Current smoker. BMI 20. S/p L CEA 01/14/15.   Medications include: ASA, plavix, aricept, levothyroxine, lisinopril, pravastatin. Pt to stop plavix 5 days prior to surgery per Dr. Trula Slade.   Preoperative labs reviewed.   Chest x-ray 01/06/2015 reviewed. COPD with chronic scarring in the left posterior costophrenic angle. No acute infiltrate is seen. Aortic atherosclerotic change.  EKG 11/24/2014: sinus rhythm, LBBB, no acute ST-T wave changes per Dr. Percival Spanish.   Carotid duplex US 08/02/15:  1. 80-99% R proximal ICA stenosis 2. Patent L CEA site with no evidence L ICA stenosis  Nuclear stress test 12/10/2014:  This is a low risk study. Overall left ventricular systolic function was abnormal. LV cavity size is normal. The left ventricular ejection fraction is mildly decreased (45-54%). There is no prior study for comparison. Antero septal scar/focal  Pt tolerated L CEA without issue last June. If no changes, I anticipate pt can proceed with surgery as scheduled.   Willeen Cass, FNP-BC [facility]/Anesthesiology Phone: [phone] 08/10/2015 4:32 PM

## 2015-08-10 NOTE — Pre-Procedure Instructions (Addendum)
Brandi Raymond  08/10/2015      Your procedure is scheduled on Thursday, August 12, 2015 at 10:20 AM.   Report to [facility] Entrance "A" Admitting Office at 8:15 AM.   Call this number if you have problems the morning of surgery: [phone]   Any questions prior to day of surgery, please call [phone] between 8 & 4 PM.   Remember:  Do not eat food or drink liquids after midnight Wednesday, 08/11/15.  Take these medicines the morning of surgery with A SIP OF WATER: Aspirin,   Levothyroxine (Synthroid), Sertraline (Zoloft)  STOP all herbel meds, nsaids (aleve,naproxen,advil,ibuprofen) now including vitamins  STOP plavix 5 days prior to surgery    Do not wear jewelry, make-up or nail polish.  Do not wear lotions, powders, or perfumes.  You may wear deodorant.  Do not shave 48 hours prior to surgery.    Do not bring valuables to the hospital.  [facility] is not responsible for any belongings or valuables.  Contacts, dentures or bridgework may not be worn into surgery.  Leave your suitcase in the car.  After surgery it may be brought to your room.  For patients admitted to the hospital, discharge time will be determined by your treatment team.  Special instructions: [facility] - Preparing for Surgery  Before surgery, you can play an important role.  Because skin is not sterile, your skin needs to be as free of germs as possible.  You can reduce the number of germs on you skin by washing with CHG (chlorahexidine gluconate) soap before surgery.  CHG is an antiseptic cleaner which kills germs and bonds with the skin to continue killing germs even after washing.  Please DO NOT use if you have an allergy to CHG or antibacterial soaps.  If your skin becomes reddened/irritated stop using the CHG and inform your nurse when you arrive at Short Stay.  Do not shave (including legs and underarms) for at least 48 hours prior to the first CHG shower.  You may shave your  face.  Please follow these instructions carefully:   1.  Shower with CHG Soap the night before surgery and the                                morning of Surgery.  2.  If you choose to wash your hair, wash your hair first as usual with your       normal shampoo.  3.  After you shampoo, rinse your hair and body thoroughly to remove the                      Shampoo.  4.  Use CHG as you would any other liquid soap.  You can apply chg directly       to the skin and wash gently with scrungie or a clean washcloth.  5.  Apply the CHG Soap to your body ONLY FROM THE NECK DOWN.        Do not use on open wounds or open sores.  Avoid contact with your eyes, ears, mouth and genitals (private parts).  Wash genitals (private parts) with your normal soap.  6.  Wash thoroughly, paying special attention to the area where your surgery        will be performed.  7.  Thoroughly rinse your body with warm water from the neck down.  8.  DO NOT shower/wash with your normal soap after using and rinsing off       the CHG Soap.  9.  Pat yourself dry with a clean towel.            10.  Wear clean pajamas.            11.  Place clean sheets on your bed the night of your first shower and do not        sleep with pets.  Day of Surgery  Do not apply any lotions the morning of surgery.  Please wear clean clothes to the hospital.   Please read over the following fact sheets that you were given. Pain Booklet, Coughing and Deep Breathing, Blood Transfusion Information, MRSA Information and Surgical Site Infection Prevention

## 2015-08-11 MED ORDER — DEXTROSE 5 % IV SOLN
1.5000 g | INTRAVENOUS | Status: AC
  Administered 2015-08-12: 1.5 g via INTRAVENOUS
  Filled 2015-08-11: qty 1.5

## 2015-08-11 MED ORDER — CHLORHEXIDINE GLUCONATE CLOTH 2 % EX PADS: 6.0000 | MEDICATED_PAD | Freq: Once | CUTANEOUS | Status: DC

## 2015-08-11 MED ORDER — SODIUM CHLORIDE 0.9 % IV SOLN: INTRAVENOUS | Status: DC

## 2015-08-11 NOTE — Anesthesia Preprocedure Evaluation (Addendum)
Anesthesia Evaluation  Patient identified by MRN, date of birth, ID band Patient awake    Reviewed: Allergy & Precautions, NPO status , Patient's Chart, lab work & pertinent test results  History of Anesthesia Complications Negative for: history of anesthetic complications  Airway Mallampati: II  TM Distance: >3 FB Neck ROM: Full    Dental no notable dental hx. (+) Dental Advisory Given   Pulmonary COPD, Current Smoker,    Pulmonary exam normal breath sounds clear to auscultation       Cardiovascular hypertension, Pt. on medications + CAD, + Past MI, + Cardiac Stents and + Peripheral Vascular Disease  Normal cardiovascular exam Rhythm:Regular Rate:Normal     Neuro/Psych PSYCHIATRIC DISORDERS Anxiety Depression Myasthenia gravis (mostly occular symptoms)    GI/Hepatic Neg liver ROS, GERD  Medicated and Controlled,  Endo/Other  Hypothyroidism   Renal/GU negative Renal ROS  negative genitourinary   Musculoskeletal  (+) Arthritis ,   Abdominal   Peds negative pediatric ROS (+)  Hematology negative hematology ROS (+)   Anesthesia Other Findings   Reproductive/Obstetrics negative OB ROS                            Anesthesia Physical Anesthesia Plan  ASA: III  Anesthesia Plan: General   Post-op Pain Management:    Induction: Intravenous  Airway Management Planned: Oral ETT  Additional Equipment: Arterial line  Intra-op Plan:   Post-operative Plan: Extubation in OR and Possible Post-op intubation/ventilation  Informed Consent: I have reviewed the patients History and Physical, chart, labs and discussed the procedure including the risks, benefits and alternatives for the proposed anesthesia with the patient or authorized representative who has indicated his/her understanding and acceptance.   Dental advisory given  Plan Discussed with: CRNA  Anesthesia Plan Comments:          Anesthesia Quick Evaluation

## 2015-08-12 ENCOUNTER — Encounter (HOSPITAL_COMMUNITY)
Admission: RE | Disposition: A | Discharge status: Home / Self Care | Payer: Self-pay | Source: Ambulatory Visit | Attending: Surgery

## 2015-08-12 ENCOUNTER — Inpatient Hospital Stay (HOSPITAL_COMMUNITY): Payer: PPO | Admitting: Emergency Medicine

## 2015-08-12 ENCOUNTER — Encounter (HOSPITAL_COMMUNITY): Payer: Self-pay | Admitting: *Deleted

## 2015-08-12 ENCOUNTER — Inpatient Hospital Stay (HOSPITAL_COMMUNITY)
Admission: RE | Admit: 2015-08-12 | Discharge: 2015-08-13 | DRG: 039 | Disposition: A | Discharge status: Home / Self Care | Payer: PPO | Source: Ambulatory Visit | Attending: Surgery | Admitting: Surgery

## 2015-08-12 ENCOUNTER — Inpatient Hospital Stay (HOSPITAL_COMMUNITY): Payer: PPO | Admitting: Anesthesiology

## 2015-08-12 DIAGNOSIS — F17210 Nicotine dependence, cigarettes, uncomplicated: Secondary | ICD-10-CM | POA: Diagnosis not present

## 2015-08-12 DIAGNOSIS — I739 Peripheral vascular disease, unspecified: Secondary | ICD-10-CM | POA: Diagnosis present

## 2015-08-12 DIAGNOSIS — R7303 Prediabetes: Secondary | ICD-10-CM | POA: Diagnosis present

## 2015-08-12 DIAGNOSIS — E039 Hypothyroidism, unspecified: Secondary | ICD-10-CM | POA: Diagnosis present

## 2015-08-12 DIAGNOSIS — K589 Irritable bowel syndrome without diarrhea: Secondary | ICD-10-CM | POA: Diagnosis present

## 2015-08-12 DIAGNOSIS — F329 Major depressive disorder, single episode, unspecified: Secondary | ICD-10-CM | POA: Diagnosis not present

## 2015-08-12 DIAGNOSIS — Z7982 Long term (current) use of aspirin: Secondary | ICD-10-CM | POA: Diagnosis not present

## 2015-08-12 DIAGNOSIS — J449 Chronic obstructive pulmonary disease, unspecified: Secondary | ICD-10-CM | POA: Diagnosis present

## 2015-08-12 DIAGNOSIS — Z79899 Other long term (current) drug therapy: Secondary | ICD-10-CM

## 2015-08-12 DIAGNOSIS — M8580 Other specified disorders of bone density and structure, unspecified site: Secondary | ICD-10-CM | POA: Diagnosis not present

## 2015-08-12 DIAGNOSIS — I2510 Atherosclerotic heart disease of native coronary artery without angina pectoris: Secondary | ICD-10-CM | POA: Diagnosis present

## 2015-08-12 DIAGNOSIS — Z23 Encounter for immunization: Secondary | ICD-10-CM | POA: Diagnosis not present

## 2015-08-12 DIAGNOSIS — I10 Essential (primary) hypertension: Secondary | ICD-10-CM | POA: Diagnosis not present

## 2015-08-12 DIAGNOSIS — I252 Old myocardial infarction: Secondary | ICD-10-CM | POA: Diagnosis not present

## 2015-08-12 DIAGNOSIS — Z7902 Long term (current) use of antithrombotics/antiplatelets: Secondary | ICD-10-CM | POA: Diagnosis not present

## 2015-08-12 DIAGNOSIS — I712 Thoracic aortic aneurysm, without rupture: Secondary | ICD-10-CM | POA: Diagnosis not present

## 2015-08-12 DIAGNOSIS — K219 Gastro-esophageal reflux disease without esophagitis: Secondary | ICD-10-CM | POA: Diagnosis not present

## 2015-08-12 DIAGNOSIS — M1990 Unspecified osteoarthritis, unspecified site: Secondary | ICD-10-CM | POA: Diagnosis not present

## 2015-08-12 DIAGNOSIS — I6521 Occlusion and stenosis of right carotid artery: Principal | ICD-10-CM | POA: Diagnosis present

## 2015-08-12 DIAGNOSIS — Z9842 Cataract extraction status, left eye: Secondary | ICD-10-CM

## 2015-08-12 DIAGNOSIS — Z9841 Cataract extraction status, right eye: Secondary | ICD-10-CM | POA: Diagnosis not present

## 2015-08-12 DIAGNOSIS — I6529 Occlusion and stenosis of unspecified carotid artery: Secondary | ICD-10-CM | POA: Diagnosis present

## 2015-08-12 DIAGNOSIS — E785 Hyperlipidemia, unspecified: Secondary | ICD-10-CM | POA: Diagnosis present

## 2015-08-12 DIAGNOSIS — D509 Iron deficiency anemia, unspecified: Secondary | ICD-10-CM | POA: Diagnosis not present

## 2015-08-12 HISTORY — PX: ENDARTERECTOMY: SHX5162

## 2015-08-12 SURGERY — ENDARTERECTOMY, CAROTID
Anesthesia: General | Site: Neck | Laterality: Right

## 2015-08-12 MED ORDER — LABETALOL HCL 5 MG/ML IV SOLN: 10.0000 mg | INTRAVENOUS | Status: DC | PRN

## 2015-08-12 MED ORDER — PANTOPRAZOLE SODIUM 40 MG PO TBEC
40.0000 mg | DELAYED_RELEASE_TABLET | Freq: Every day | ORAL | Status: DC
  Administered 2015-08-13: 40 mg via ORAL
  Filled 2015-08-12: qty 1

## 2015-08-12 MED ORDER — PHENYLEPHRINE HCL 10 MG/ML IJ SOLN
10.0000 mg | INTRAVENOUS | Status: DC | PRN
  Administered 2015-08-12: 100 ug/min via INTRAVENOUS

## 2015-08-12 MED ORDER — LACTATED RINGERS IV SOLN
INTRAVENOUS | Status: DC
  Administered 2015-08-12: 09:00:00 via INTRAVENOUS

## 2015-08-12 MED ORDER — SODIUM CHLORIDE 0.9 % IV SOLN
0.0125 ug/kg/min | INTRAVENOUS | Status: AC
  Administered 2015-08-12: .2 ug/kg/min via INTRAVENOUS
  Filled 2015-08-12: qty 2000

## 2015-08-12 MED ORDER — EPHEDRINE SULFATE 50 MG/ML IJ SOLN
INTRAMUSCULAR | Status: AC
  Filled 2015-08-12: qty 1

## 2015-08-12 MED ORDER — ONDANSETRON HCL 4 MG/2ML IJ SOLN: 4.0000 mg | Freq: Four times a day (QID) | INTRAMUSCULAR | Status: DC | PRN

## 2015-08-12 MED ORDER — ONDANSETRON HCL 4 MG/2ML IJ SOLN
INTRAMUSCULAR | Status: DC | PRN
  Administered 2015-08-12: 4 mg via INTRAVENOUS

## 2015-08-12 MED ORDER — MORPHINE SULFATE (PF) 2 MG/ML IV SOLN
2.0000 mg | INTRAVENOUS | Status: DC | PRN
Start: 2015-08-12 — End: 2015-08-13

## 2015-08-12 MED ORDER — PRAVASTATIN SODIUM 40 MG PO TABS: 40.0000 mg | ORAL_TABLET | Freq: Every day | ORAL | Status: DC

## 2015-08-12 MED ORDER — SUGAMMADEX SODIUM 200 MG/2ML IV SOLN
INTRAVENOUS | Status: DC | PRN
  Administered 2015-08-12: 200 mg via INTRAVENOUS

## 2015-08-12 MED ORDER — LACTATED RINGERS IV SOLN
INTRAVENOUS | Status: DC | PRN
  Administered 2015-08-12 (×2): via INTRAVENOUS

## 2015-08-12 MED ORDER — FENTANYL CITRATE (PF) 250 MCG/5ML IJ SOLN
INTRAMUSCULAR | Status: AC
  Filled 2015-08-12: qty 5

## 2015-08-12 MED ORDER — ROCURONIUM BROMIDE 100 MG/10ML IV SOLN
INTRAVENOUS | Status: DC | PRN
  Administered 2015-08-12: 25 mg via INTRAVENOUS

## 2015-08-12 MED ORDER — PNEUMOCOCCAL VAC POLYVALENT 25 MCG/0.5ML IJ INJ
0.5000 mL | INJECTION | INTRAMUSCULAR | Status: AC
  Administered 2015-08-13: 0.5 mL via INTRAMUSCULAR
  Filled 2015-08-12: qty 0.5

## 2015-08-12 MED ORDER — ALUM & MAG HYDROXIDE-SIMETH 200-200-20 MG/5ML PO SUSP: 15.0000 mL | ORAL | Status: DC | PRN

## 2015-08-12 MED ORDER — LEVOTHYROXINE SODIUM 112 MCG PO TABS
112.0000 ug | ORAL_TABLET | Freq: Every day | ORAL | Status: DC
  Administered 2015-08-13: 112 ug via ORAL
  Filled 2015-08-12: qty 1

## 2015-08-12 MED ORDER — 0.9 % SODIUM CHLORIDE (POUR BTL) OPTIME
TOPICAL | Status: DC | PRN
  Administered 2015-08-12: 3000 mL

## 2015-08-12 MED ORDER — HYDRALAZINE HCL 20 MG/ML IJ SOLN: 5.0000 mg | INTRAMUSCULAR | Status: DC | PRN

## 2015-08-12 MED ORDER — CLOPIDOGREL BISULFATE 75 MG PO TABS
75.0000 mg | ORAL_TABLET | Freq: Every day | ORAL | Status: DC
  Administered 2015-08-13: 75 mg via ORAL
  Filled 2015-08-12: qty 1

## 2015-08-12 MED ORDER — GLYCOPYRROLATE 0.2 MG/ML IJ SOLN
INTRAMUSCULAR | Status: DC | PRN
  Administered 2015-08-12: 0.2 mg via INTRAVENOUS

## 2015-08-12 MED ORDER — ONDANSETRON HCL 4 MG/2ML IJ SOLN: 4.0000 mg | Freq: Once | INTRAMUSCULAR | Status: DC | PRN

## 2015-08-12 MED ORDER — ONDANSETRON HCL 4 MG/2ML IJ SOLN
INTRAMUSCULAR | Status: AC
  Filled 2015-08-12: qty 2

## 2015-08-12 MED ORDER — ACETAMINOPHEN 650 MG RE SUPP: 325.0000 mg | RECTAL | Status: DC | PRN

## 2015-08-12 MED ORDER — LIDOCAINE HCL (CARDIAC) 20 MG/ML IV SOLN
INTRAVENOUS | Status: AC
  Filled 2015-08-12: qty 5

## 2015-08-12 MED ORDER — LIDOCAINE HCL (CARDIAC) 20 MG/ML IV SOLN
INTRAVENOUS | Status: DC | PRN
  Administered 2015-08-12: 60 mg via INTRAVENOUS

## 2015-08-12 MED ORDER — HEMOSTATIC AGENTS (NO CHARGE) OPTIME
TOPICAL | Status: DC | PRN
  Administered 2015-08-12: 1 via TOPICAL

## 2015-08-12 MED ORDER — LISINOPRIL 5 MG PO TABS
5.0000 mg | ORAL_TABLET | Freq: Two times a day (BID) | ORAL | Status: DC
  Administered 2015-08-13: 5 mg via ORAL
  Filled 2015-08-12: qty 1

## 2015-08-12 MED ORDER — ASPIRIN 81 MG PO TABS: 81.0000 mg | ORAL_TABLET | Freq: Every day | ORAL | Status: DC

## 2015-08-12 MED ORDER — MAGNESIUM SULFATE 2 GM/50ML IV SOLN: 2.0000 g | Freq: Every day | INTRAVENOUS | Status: DC | PRN

## 2015-08-12 MED ORDER — PROPOFOL 10 MG/ML IV BOLUS
INTRAVENOUS | Status: AC
  Filled 2015-08-12: qty 20

## 2015-08-12 MED ORDER — DONEPEZIL HCL 10 MG PO TABS
10.0000 mg | ORAL_TABLET | Freq: Every day | ORAL | Status: DC
  Administered 2015-08-12: 10 mg via ORAL
  Filled 2015-08-12: qty 1

## 2015-08-12 MED ORDER — FENTANYL CITRATE (PF) 100 MCG/2ML IJ SOLN: 25.0000 ug | INTRAMUSCULAR | Status: DC | PRN

## 2015-08-12 MED ORDER — SENNOSIDES-DOCUSATE SODIUM 8.6-50 MG PO TABS: 1.0000 | ORAL_TABLET | Freq: Every evening | ORAL | Status: DC | PRN

## 2015-08-12 MED ORDER — ENOXAPARIN SODIUM 30 MG/0.3ML ~~LOC~~ SOLN
30.0000 mg | SUBCUTANEOUS | Status: DC
  Administered 2015-08-13: 30 mg via SUBCUTANEOUS
  Filled 2015-08-12: qty 0.3

## 2015-08-12 MED ORDER — ROCURONIUM BROMIDE 50 MG/5ML IV SOLN
INTRAVENOUS | Status: AC
  Filled 2015-08-12: qty 1

## 2015-08-12 MED ORDER — ACETAMINOPHEN 325 MG PO TABS
325.0000 mg | ORAL_TABLET | ORAL | Status: DC | PRN
  Administered 2015-08-12: 650 mg via ORAL
  Filled 2015-08-12: qty 2

## 2015-08-12 MED ORDER — ASPIRIN EC 81 MG PO TBEC
81.0000 mg | DELAYED_RELEASE_TABLET | Freq: Every day | ORAL | Status: DC
  Administered 2015-08-13: 81 mg via ORAL
  Filled 2015-08-12: qty 1

## 2015-08-12 MED ORDER — SODIUM CHLORIDE 0.9 % IV SOLN: 0.0125 ug/kg/min | INTRAVENOUS | Status: DC

## 2015-08-12 MED ORDER — BISACODYL 5 MG PO TBEC: 5.0000 mg | DELAYED_RELEASE_TABLET | Freq: Every day | ORAL | Status: DC | PRN

## 2015-08-12 MED ORDER — FENTANYL CITRATE (PF) 100 MCG/2ML IJ SOLN
INTRAMUSCULAR | Status: DC | PRN
  Administered 2015-08-12: 100 ug via INTRAVENOUS

## 2015-08-12 MED ORDER — PROTAMINE SULFATE 10 MG/ML IV SOLN
INTRAVENOUS | Status: DC | PRN
  Administered 2015-08-12: 50 mg via INTRAVENOUS

## 2015-08-12 MED ORDER — SUGAMMADEX SODIUM 200 MG/2ML IV SOLN
INTRAVENOUS | Status: AC
  Filled 2015-08-12: qty 2

## 2015-08-12 MED ORDER — POLYETHYL GLYCOL-PROPYL GLYCOL 0.4-0.3 % OP GEL: Freq: Every day | OPHTHALMIC | Status: DC | PRN

## 2015-08-12 MED ORDER — SODIUM CHLORIDE 0.9 % IV SOLN
500.0000 mL | Freq: Once | INTRAVENOUS | Status: AC | PRN
  Administered 2015-08-12: 500 mL via INTRAVENOUS

## 2015-08-12 MED ORDER — PHENOL 1.4 % MT LIQD: 1.0000 | OROMUCOSAL | Status: DC | PRN

## 2015-08-12 MED ORDER — SODIUM CHLORIDE 0.9 % IV SOLN
INTRAVENOUS | Status: DC | PRN
  Administered 2015-08-12: 11:00:00

## 2015-08-12 MED ORDER — SERTRALINE HCL 100 MG PO TABS
100.0000 mg | ORAL_TABLET | Freq: Every day | ORAL | Status: DC
  Administered 2015-08-13: 100 mg via ORAL
  Filled 2015-08-12: qty 1

## 2015-08-12 MED ORDER — PROPOFOL 10 MG/ML IV BOLUS
INTRAVENOUS | Status: DC | PRN
  Administered 2015-08-12: 120 mg via INTRAVENOUS

## 2015-08-12 MED ORDER — HEPARIN SODIUM (PORCINE) 1000 UNIT/ML IJ SOLN
INTRAMUSCULAR | Status: AC
  Filled 2015-08-12: qty 1

## 2015-08-12 MED ORDER — HEPARIN SODIUM (PORCINE) 1000 UNIT/ML IJ SOLN
INTRAMUSCULAR | Status: DC | PRN
  Administered 2015-08-12: 6 mL via INTRAVENOUS

## 2015-08-12 MED ORDER — PROTAMINE SULFATE 10 MG/ML IV SOLN
INTRAVENOUS | Status: AC
  Filled 2015-08-12: qty 5

## 2015-08-12 MED ORDER — METOPROLOL TARTRATE 1 MG/ML IV SOLN: 2.0000 mg | INTRAVENOUS | Status: DC | PRN

## 2015-08-12 MED ORDER — SODIUM CHLORIDE 0.9 % IV SOLN: INTRAVENOUS | Status: DC

## 2015-08-12 MED ORDER — POTASSIUM CHLORIDE CRYS ER 20 MEQ PO TBCR: 20.0000 meq | EXTENDED_RELEASE_TABLET | Freq: Every day | ORAL | Status: DC | PRN

## 2015-08-12 MED ORDER — DEXTROSE 5 % IV SOLN
1.5000 g | Freq: Two times a day (BID) | INTRAVENOUS | Status: AC
  Administered 2015-08-12 – 2015-08-13 (×2): 1.5 g via INTRAVENOUS
  Filled 2015-08-12 (×2): qty 1.5

## 2015-08-12 MED ORDER — CLONAZEPAM 0.5 MG PO TABS
0.5000 mg | ORAL_TABLET | Freq: Every day | ORAL | Status: DC
Start: 2015-08-12 — End: 2015-08-13
  Administered 2015-08-12: 0.5 mg via ORAL
  Filled 2015-08-12: qty 1

## 2015-08-12 MED ORDER — GABAPENTIN 300 MG PO CAPS
300.0000 mg | ORAL_CAPSULE | Freq: Three times a day (TID) | ORAL | Status: DC
  Administered 2015-08-12 – 2015-08-13 (×3): 300 mg via ORAL
  Filled 2015-08-12 (×3): qty 1

## 2015-08-12 MED ORDER — DOCUSATE SODIUM 100 MG PO CAPS
100.0000 mg | ORAL_CAPSULE | Freq: Every day | ORAL | Status: DC
  Administered 2015-08-13: 100 mg via ORAL
  Filled 2015-08-12: qty 1

## 2015-08-12 MED ORDER — OXYCODONE HCL 5 MG PO TABS
5.0000 mg | ORAL_TABLET | ORAL | Status: DC | PRN
  Administered 2015-08-13: 10 mg via ORAL
  Filled 2015-08-12 (×2): qty 1

## 2015-08-12 MED ORDER — GUAIFENESIN-DM 100-10 MG/5ML PO SYRP: 15.0000 mL | ORAL_SOLUTION | ORAL | Status: DC | PRN

## 2015-08-12 SURGICAL SUPPLY — 44 items
CANISTER SUCTION 2500CC (MISCELLANEOUS) ×2 IMPLANT
CATH ROBINSON RED A/P 18FR (CATHETERS) ×2 IMPLANT
CATH SUCT 10FR WHISTLE TIP (CATHETERS) ×2 IMPLANT
CLIP TI MEDIUM 6 (CLIP) ×2 IMPLANT
CLIP TI WIDE RED SMALL 6 (CLIP) ×2 IMPLANT
CRADLE DONUT ADULT HEAD (MISCELLANEOUS) ×2 IMPLANT
DRAIN CHANNEL 15F RND FF W/TCR (WOUND CARE) IMPLANT
ELECT REM PT RETURN 9FT ADLT (ELECTROSURGICAL) ×2
ELECTRODE REM PT RTRN 9FT ADLT (ELECTROSURGICAL) ×1 IMPLANT
EVACUATOR SILICONE 100CC (DRAIN) IMPLANT
GAUZE SPONGE 4X4 12PLY STRL (GAUZE/BANDAGES/DRESSINGS) ×2 IMPLANT
GLOVE BIOGEL PI IND STRL 7.5 (GLOVE) ×1 IMPLANT
GLOVE BIOGEL PI INDICATOR 7.5 (GLOVE) ×1
GLOVE SURG SS PI 7.5 STRL IVOR (GLOVE) ×2 IMPLANT
GOWN STRL REUS W/ TWL LRG LVL3 (GOWN DISPOSABLE) ×2 IMPLANT
GOWN STRL REUS W/ TWL XL LVL3 (GOWN DISPOSABLE) ×1 IMPLANT
GOWN STRL REUS W/TWL LRG LVL3 (GOWN DISPOSABLE) ×2
GOWN STRL REUS W/TWL XL LVL3 (GOWN DISPOSABLE) ×1
HEMOSTAT SNOW SURGICEL 2X4 (HEMOSTASIS) IMPLANT
INSERT FOGARTY SM (MISCELLANEOUS) IMPLANT
KIT BASIN OR (CUSTOM PROCEDURE TRAY) ×2 IMPLANT
KIT ROOM TURNOVER OR (KITS) ×2 IMPLANT
LIQUID BAND (GAUZE/BANDAGES/DRESSINGS) ×2 IMPLANT
NEEDLE HYPO 25GX1X1/2 BEV (NEEDLE) IMPLANT
NS IRRIG 1000ML POUR BTL (IV SOLUTION) ×6 IMPLANT
PACK CAROTID (CUSTOM PROCEDURE TRAY) ×2 IMPLANT
PAD ARMBOARD 7.5X6 YLW CONV (MISCELLANEOUS) ×4 IMPLANT
PATCH VASC XENOSURE 1CMX6CM (Vascular Products) ×1 IMPLANT
PATCH VASC XENOSURE 1X6 (Vascular Products) ×1 IMPLANT
SHUNT CAROTID BYPASS 10 (VASCULAR PRODUCTS) IMPLANT
SHUNT CAROTID BYPASS 12FRX15.5 (VASCULAR PRODUCTS) IMPLANT
SPONGE INTESTINAL PEANUT (DISPOSABLE) ×2 IMPLANT
SUT ETHILON 3 0 PS 1 (SUTURE) IMPLANT
SUT PROLENE 5 0 C 1 24 (SUTURE) ×4 IMPLANT
SUT PROLENE 6 0 BV (SUTURE) ×4 IMPLANT
SUT PROLENE 7 0 BV 1 (SUTURE) ×2 IMPLANT
SUT PROLENE 7 0 BV1 MDA (SUTURE) ×2 IMPLANT
SUT SILK 3 0 TIES 17X18 (SUTURE)
SUT SILK 3-0 18XBRD TIE BLK (SUTURE) IMPLANT
SUT VIC AB 3-0 SH 27 (SUTURE) ×2
SUT VIC AB 3-0 SH 27X BRD (SUTURE) ×2 IMPLANT
SUT VICRYL 4-0 PS2 18IN ABS (SUTURE) ×2 IMPLANT
SYR CONTROL 10ML LL (SYRINGE) IMPLANT
WATER STERILE IRR 1000ML POUR (IV SOLUTION) ×2 IMPLANT

## 2015-08-12 NOTE — Transfer of Care (Signed)
Immediate Anesthesia Transfer of Care Note  Patient: Brandi Raymond  Procedure(s) Performed: Procedure(s): ENDARTERECTOMY CAROTID WITH PATCH ANGIOPLASTY (Right)  Patient Location: PACU  Anesthesia Type:General  Level of Consciousness: awake, alert , oriented and patient cooperative  Airway & Oxygen Therapy: Patient Spontanous Breathing and Patient connected to nasal cannula oxygen  Post-op Assessment: Report given to RN, Post -op Vital signs reviewed and stable and Patient moving all extremities X 4  Post vital signs: Reviewed and stable  Last Vitals:  Filed Vitals:   08/12/15 0817 08/12/15 0818  BP: 157/69   Pulse: 71   Temp:  36.6 C  Resp: 16     Complications: No apparent anesthesia complications

## 2015-08-12 NOTE — Progress Notes (Addendum)
  [facility] Day of Surgery Note  Subjective: Patient seen in PACU. Having a mild headache.   Filed Vitals:   08/12/15 1358 08/12/15 1400  BP: 107/49   Pulse: 57 57  Temp:    Resp: 14 13    Right neck incision c/d/i. No hematoma. Smile symmetric. Tongue midline. 5/5 strength upper and lower extremities  Assessment/Plan:  This is a 82 y.o. female who is s/p right carotid endarterectomy  Soft blood pressure in PACU requiring 500 cc fluid bolus. BP now stable.  Neuro exam intact.  No hematoma right neck.  To 3S soon.    Virgina Jock, [place] Pager: [phone] 08/12/2015 2:18 PM   Neuro intact, s/o R CEA  Annamarie Major

## 2015-08-12 NOTE — Op Note (Signed)
Patient name: Brandi Raymond MRN: [id number] DOB: 1934-01-11 Sex: female  08/12/2015 Pre-operative Diagnosis: Asymptomatic   right carotid stenosis Post-operative diagnosis:  Same Surgeon:  Annamarie Major Assistants:  Lennie Muckle Procedure:    right carotid Endarterectomy with bovine pericardial  patch angioplasty Anesthesia:  General Blood Loss:  See anesthesia record Specimens:  Carotid Plaque to pathology  Findings:  85 %stenosis; Thrombus:  none  Indications:  >80% stenosis by ultrasound.  History of left CEA and mesenteric stenting.    Procedure:  The patient was identified in the holding area and taken to [place] [facility]  The patient was then placed supine on the table.   General endotrachial anesthesia was administered.  The patient was prepped and draped in the usual sterile fashion.  A time out was called and antibiotics were administered.  The incision was made along the anterior border of the right sternocleidomastoid muscle.  Cautery was used to dissect through the subcutaneous tissue.  The platysma muscle was divided with cautery.  The internal jugular vein was exposed along its anterior medial border.  The common facial vein was exposed and then divided between 2-0 silk ties and metal clips.  The common carotid artery was then circumferentially exposed and encircled with an umbilical tape.  The vagus nerve was identified and protected.  Next sharp dissection was used to expose the external carotid artery and the superior thyroid artery.  The were encircled with a blue vessel loop and a 2-0 silk tie respectively.  Finally, the internal carotid was carefully dissected free.  An umbilical tape was placed around the internal carotid artery distal to the diseased segment.  The hypoglossal nerve was visualized throughout and protected.  The patient was given systemic heparinization.  A bovine carotid patch was selected and prepared on the back table.  A 10 french shunt was also prepared.   After blood pressure readings were appropriate and the heparin had been given time to circulate, the internal carotid artery was occluded with a baby Gregory clamp.  The external and common carotid arteries were then occluded with vascular clamps and the 2-0 tie tightened on the superior thyroid artery.  A #11 blade was used to make an arteriotomy in the common carotid artery.  This was extended with Potts scissors along the anterior and lateral border of the common and internal carotid artery.  Approximately 85% stenosis was identified.  There was no thrombus identified.  The 10 french shunt was no placed as there was pulsatile backbleeding.  A kleiner kuntz elevator was used to perform endarterectomy.  An eversion endarterectomy was performed in the external carotid artery.  A good distal endpoint was obtained in the internal carotid artery.  7-0 tacking sutures were placedThe specimen was removed and sent to pathology.  Heparinized saline was used to irrigate the endarterectomized field.  All potential embolic debris was removed.  Bovine pericardial patch angioplasty was then performed using a running 6-0 Prolene. The common internal and external carotid arteries were all appropriately flushed. The artery was again irrigated with heparin saline.  The anastomosis was then secured. The clamp was first released on the external carotid artery followed by the common carotid artery approximately 30 seconds later, bloodflow was reestablish through the internal carotid artery.  Next, a hand-held  Doppler was used to evaluate the signals in the common, external, and internal  carotid arteries, all of which had appropriate signals. I then administered  50 mg protamine. The wound was  then irrigated.  After hemostasis was achieved, the carotid sheath was reapproximated with 3-0 Vicryl. The  platysma muscle was reapproximated with running 3-0 Vicryl. The skin  was closed with 4-0 Vicryl. Dermabond was placed on the skin.  The  patient was then successfully extubated. His neurologic exam was  similar to his preprocedural exam. The patient was then taken to recovery room  in stable condition. There were no complications.     Disposition:  To PACU in stable condition.  Relevant Operative Details:  Plaque extended proximally into the common carotid for about 5 cm.  The stenosis was 85% with significant calcified plaque at the bifurcation.  No shunt was required given excellent backbleeding.  I promarilly repaired the CCA up to the external origin adn then used a bovine pericardial patch.  Theotis Burrow, M.D. [facility] [place] Office: [phone] Pager:  [phone]

## 2015-08-12 NOTE — Interval H&P Note (Signed)
History and Physical Interval Note:  08/12/2015 10:20 AM  Brandi Raymond  has presented today for surgery, with the diagnosis of Right carotid artery stenosis I65.21  The various methods of treatment have been discussed with the patient and family. After consideration of risks, benefits and other options for treatment, the patient has consented to  Procedure(s): ENDARTERECTOMY CAROTID (Right) as a surgical intervention .  The patient's history has been reviewed, patient examined, no change in status, stable for surgery.  I have reviewed the patient's chart and labs.  Questions were answered to the patient's satisfaction.     Annamarie Major

## 2015-08-12 NOTE — Anesthesia Procedure Notes (Signed)
Procedure Name: Intubation Date/Time: 08/12/2015 10:41 AM Performed by: Lance Coon Pre-anesthesia Checklist: Patient identified, Emergency Drugs available, Timeout performed, Suction available and Patient being monitored Patient Re-evaluated:Patient Re-evaluated prior to inductionOxygen Delivery Method: Circle system utilized Preoxygenation: Pre-oxygenation with 100% oxygen Intubation Type: IV induction Ventilation: Mask ventilation without difficulty Laryngoscope Size: Miller and 2 Grade View: Grade I Tube type: Oral Tube size: 7.0 mm Number of attempts: 1 Airway Equipment and Method: Stylet Placement Confirmation: ETT inserted through vocal cords under direct vision,  breath sounds checked- equal and bilateral and positive ETCO2 Secured at: 19 cm Tube secured with: Tape Dental Injury: Teeth and Oropharynx as per pre-operative assessment
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
## 2015-08-13 ENCOUNTER — Encounter (HOSPITAL_COMMUNITY): Payer: Self-pay | Admitting: Surgery

## 2015-08-13 DIAGNOSIS — Z7982 Long term (current) use of aspirin: Secondary | ICD-10-CM | POA: Diagnosis not present

## 2015-08-13 DIAGNOSIS — F329 Major depressive disorder, single episode, unspecified: Secondary | ICD-10-CM | POA: Diagnosis not present

## 2015-08-13 DIAGNOSIS — K219 Gastro-esophageal reflux disease without esophagitis: Secondary | ICD-10-CM | POA: Diagnosis not present

## 2015-08-13 DIAGNOSIS — I252 Old myocardial infarction: Secondary | ICD-10-CM | POA: Diagnosis not present

## 2015-08-13 DIAGNOSIS — I10 Essential (primary) hypertension: Secondary | ICD-10-CM | POA: Diagnosis not present

## 2015-08-13 DIAGNOSIS — E039 Hypothyroidism, unspecified: Secondary | ICD-10-CM | POA: Diagnosis not present

## 2015-08-13 DIAGNOSIS — K589 Irritable bowel syndrome without diarrhea: Secondary | ICD-10-CM | POA: Diagnosis not present

## 2015-08-13 DIAGNOSIS — R7303 Prediabetes: Secondary | ICD-10-CM | POA: Diagnosis not present

## 2015-08-13 DIAGNOSIS — I712 Thoracic aortic aneurysm, without rupture: Secondary | ICD-10-CM | POA: Diagnosis not present

## 2015-08-13 DIAGNOSIS — Z23 Encounter for immunization: Secondary | ICD-10-CM | POA: Diagnosis not present

## 2015-08-13 DIAGNOSIS — I2510 Atherosclerotic heart disease of native coronary artery without angina pectoris: Secondary | ICD-10-CM | POA: Diagnosis not present

## 2015-08-13 DIAGNOSIS — Z79899 Other long term (current) drug therapy: Secondary | ICD-10-CM | POA: Diagnosis not present

## 2015-08-13 DIAGNOSIS — Z9842 Cataract extraction status, left eye: Secondary | ICD-10-CM | POA: Diagnosis not present

## 2015-08-13 DIAGNOSIS — I739 Peripheral vascular disease, unspecified: Secondary | ICD-10-CM | POA: Diagnosis not present

## 2015-08-13 DIAGNOSIS — J449 Chronic obstructive pulmonary disease, unspecified: Secondary | ICD-10-CM | POA: Diagnosis not present

## 2015-08-13 DIAGNOSIS — M8580 Other specified disorders of bone density and structure, unspecified site: Secondary | ICD-10-CM | POA: Diagnosis not present

## 2015-08-13 DIAGNOSIS — M1990 Unspecified osteoarthritis, unspecified site: Secondary | ICD-10-CM | POA: Diagnosis not present

## 2015-08-13 DIAGNOSIS — Z7902 Long term (current) use of antithrombotics/antiplatelets: Secondary | ICD-10-CM | POA: Diagnosis not present

## 2015-08-13 DIAGNOSIS — E785 Hyperlipidemia, unspecified: Secondary | ICD-10-CM | POA: Diagnosis not present

## 2015-08-13 DIAGNOSIS — F17210 Nicotine dependence, cigarettes, uncomplicated: Secondary | ICD-10-CM | POA: Diagnosis not present

## 2015-08-13 DIAGNOSIS — I6521 Occlusion and stenosis of right carotid artery: Secondary | ICD-10-CM | POA: Diagnosis not present

## 2015-08-13 DIAGNOSIS — Z9841 Cataract extraction status, right eye: Secondary | ICD-10-CM | POA: Diagnosis not present

## 2015-08-13 LAB — BASIC METABOLIC PANEL
Anion gap: 4 — ABNORMAL LOW (ref 5–15)
BUN: 13 mg/dL (ref 6–20)
CO2: 25 mmol/L (ref 22–32)
Calcium: 7.8 mg/dL — ABNORMAL LOW (ref 8.9–10.3)
Chloride: 111 mmol/L (ref 101–111)
Creatinine, Ser: 0.91 mg/dL (ref 0.44–1.00)
GFR calc Af Amer: >60 mL/min (ref >60)
GFR calc non Af Amer: 58 mL/min — ABNORMAL LOW (ref >60)
Glucose, Bld: 90 mg/dL (ref 65–99)
Potassium: 4.4 mmol/L (ref 3.5–5.1)
Sodium: 140 mmol/L (ref 135–145)

## 2015-08-13 LAB — CBC
HCT: 35.2 % — ABNORMAL LOW (ref 36.0–46.0)
Hemoglobin: 11 g/dL — ABNORMAL LOW (ref 12.0–15.0)
MCH: 28.5 pg (ref 26.0–34.0)
MCHC: 31.3 g/dL (ref 30.0–36.0)
MCV: 91.2 fL (ref 78.0–100.0)
Platelets: 114 10*3/uL — ABNORMAL LOW (ref 150–400)
RBC: 3.86 MIL/uL — ABNORMAL LOW (ref 3.87–5.11)
RDW: 13.4 % (ref 11.5–15.5)
WBC: 7.1 10*3/uL (ref 4.0–10.5)

## 2015-08-13 MED ORDER — OXYCODONE-ACETAMINOPHEN 5-325 MG PO TABS: 1.0000 | ORAL_TABLET | Freq: Four times a day (QID) | ORAL | Status: DC | PRN

## 2015-08-13 NOTE — Care Management Note (Signed)
Case Management Note  Patient Details  Name: ELISSE PENNICK MRN: [id number] Date of Birth: [birth year]  Subjective/Objective:      Date: 08/13/15 Spoke with patient at the bedside along with spouse.  Introduced self as Tourist information centre manager and explained role in discharge planning and how to be reached.  Verified patient lives in town,  with spouse , pta indep, Has a  rolling walker but does not use it.  Expressed potential need for no other DME, RN will check to see if she may need home oxygen.  Verified patient anticipates to go home with family, at time of discharge and will have full-time supervision by family at this time to best of their knowledge. Patient denied needing help with their medication.  Patient  is driven by spouse to MD appointments.  Verified patient has PCP Celso Amy.  Plan: CM will continue to follow for discharge planning and [facility] resources.               Action/Plan:   Expected Discharge Date:                  Expected Discharge Plan:  Home/Self Care  In-House Referral:     Discharge planning Services  CM Consult  Post Acute Care Choice:    Choice offered to:     DME Arranged:    DME Agency:     HH Arranged:    [place] Agency:     Status of Service:  Completed, signed off  Medicare Important Message Given:    Date Medicare IM Given:    Medicare IM give by:    Date Additional Medicare IM Given:    Additional Medicare Important Message give by:     If discussed at [place] of Stay Meetings, dates discussed:    Additional Comments:  Zenon Mayo, RN 08/13/2015, 10:32 AM

## 2015-08-13 NOTE — Anesthesia Postprocedure Evaluation (Signed)
Anesthesia Post Note  Patient: ARYKA COONRADT  Procedure(s) Performed: Procedure(s) (LRB): ENDARTERECTOMY CAROTID WITH PATCH ANGIOPLASTY (Right)  Anesthesia Type: General Level of consciousness: awake and alert Pain management: pain level controlled Vital Signs Assessment: post-procedure vital signs reviewed and stable Respiratory status: spontaneous breathing, nonlabored ventilation, respiratory function stable and patient connected to nasal cannula oxygen Cardiovascular status: blood pressure returned to baseline and stable Postop Assessment: no signs of nausea or vomiting Anesthetic complications: no    Last Vitals:  Filed Vitals:   08/13/15 0700 08/13/15 0751  BP:  146/72  Pulse:  66  Temp: 37.2 C   Resp:  13    Last Pain:  Filed Vitals:   08/13/15 1133  PainSc: 8                  Talecia Sherlin JENNETTE

## 2015-08-13 NOTE — Progress Notes (Signed)
DC instructions given to patient and spouse. Educated on importance of medication regimen and smoking cessation. Educated on S/S stroke and site infection, driving and lift restrictions. VSS. eICU and CCMT notified of discharge. PIV DC, hemostasis achieved. Pt has walker at home. Will continue to monitor until time of DC.

## 2015-08-13 NOTE — Progress Notes (Signed)
  [facility] Progress Note  Subjective  - POD #1  Still having a headache this morning, but is improving.   Objective Filed Vitals:   08/13/15 0425 08/13/15 0700  BP: 135/52   Pulse: 57   Temp: 98.3 F (36.8 C) 98.9 F (37.2 C)  Resp: 16     Intake/Output Summary (Last 24 hours) at 08/13/15 0835 Last data filed at 08/13/15 0500  Gross per 24 hour  Intake 2877.5 ml  Output     20 ml  Net 2857.5 ml   Right neck incision without hematoma. Moving all extremities equally. No smile asymmetry. No tongue deviation.   Assessment/Planning: 82 y.o. female is s/p: right carotid endarterectomy 1 Day Post-Op   Neuro exam intact.  Headache improving.  Incision without hematoma.  Tolerating diet well.  Will need to ambulate.  Discharge home today. On ASA, plavix and statin.   Alvia Grove 08/13/2015 8:35 AM --  Laboratory CBC    Component Value Date/Time   WBC 7.1 08/13/2015 0452   HGB 11.0* 08/13/2015 0452   HCT 35.2* 08/13/2015 0452   PLT 114* 08/13/2015 0452    BMET    Component Value Date/Time   NA 140 08/13/2015 0452   K 4.4 08/13/2015 0452   CL 111 08/13/2015 0452   CO2 25 08/13/2015 0452   GLUCOSE 90 08/13/2015 0452   BUN 13 08/13/2015 0452   CREATININE 0.91 08/13/2015 0452   CALCIUM 7.8* 08/13/2015 0452   GFRNONAA 58* 08/13/2015 0452   GFRAA >60 08/13/2015 0452    COAG Lab Results  Component Value Date   INR 0.99 08/10/2015   INR 1.02 01/06/2015   INR 0.9 01/07/2009   No results found for: PTT  Antibiotics Anti-infectives    Start     Dose/Rate Route Frequency Ordered Stop   08/12/15 2230  cefUROXime (ZINACEF) 1.5 g in dextrose 5 % 50 mL IVPB     1.5 g 100 mL/hr over 30 Minutes Intravenous Every 12 hours 08/12/15 1534 08/13/15 2229   08/12/15 0945  cefUROXime (ZINACEF) 1.5 g in dextrose 5 % 50 mL IVPB     1.5 g 100 mL/hr over 30 Minutes Intravenous To ShortStay Surgical 08/11/15 1051 08/12/15 Mullens, PA-C [facility] Office: [phone] Pager: [phone] 08/13/2015 8:35 AM

## 2015-08-14 ENCOUNTER — Encounter: Payer: Self-pay | Admitting: Surgery

## 2015-08-17 NOTE — Discharge Summary (Signed)
[facility] Discharge Summary  Brandi Raymond 09/22/1933 82 y.o. female  [id number]  Admission Date: 08/12/2015  Discharge Date: 08/13/2015  Physician: Harold Barban, MD  Admission Diagnosis: Right carotid artery stenosis I65.21  HPI:   This is a 82 y.o. female who went for a six-month follow-up of her left carotid endarterectomy on 08/02/2015.The carotid duplex performed for follow-up showed a progression of stenosis on the right carotid to greater than 80%. Patient also has a history of mesenteric stenosis.She had a 6 x 15 balloon expandable stent placed in her proximal superior mesenteric artery on 12/03/2012. This was done from a right brachial approach. She underwent angioplasty on 10/28/2014.  She denies neurologic symptoms. Specifically, she denies numbness or weakness in either extremity. She denies slurred speech. She denies amaurosis fugax. She continues to smoke. She has COPD but is not on home oxygen. Her hypercholesterolemia is managed with a statin. She is on ACE inhibitor for hypertension. Sheis on dual antiplatelet therapy  Hospital Course:  The patient was admitted to the hospital and taken to the operating room on 08/12/2015 and underwent right carotid endarterectomy.  The patient tolerated the procedure well and was transported to the PACU in stable condition.  By POD 1, the patient's neuro exam is intact with ability to move all extremities equally, no smile asymmetry and no tongue deviation. Right neck incision without hematoma. Tolerated diet well and ambulating with minimal assistance. Only complaint was having a headache however it was improving. The patient was discharged home on POD 1 in good condition.   Discharge Instructions:   The patient is discharged to home with extensive instructions on wound care and progressive ambulation.  They are instructed not to drive or perform any heavy lifting until returning to see the physician in  his office.  Discharge Instructions    CAROTID Sugery: Call MD for difficulty swallowing or speaking; weakness in arms or legs that is a new symtom; severe headache.  If you have increased swelling in the neck and/or  are having difficulty breathing, CALL 911    Complete by:  As directed      Call MD for:  redness, tenderness, or signs of infection (pain, swelling, bleeding, redness, odor or green/yellow discharge around incision site)    Complete by:  As directed      Call MD for:  severe or increased pain, loss or decreased feeling  in affected limb(s)    Complete by:  As directed      Call MD for:  temperature >100.5    Complete by:  As directed      Discharge wound care:    Complete by:  As directed   Wash right neck incision daily with soap and water and pat dry. Do not peel the skin glue off. It will come off on its own.     Driving Restrictions    Complete by:  As directed   No driving for 2 weeks     Increase activity slowly    Complete by:  As directed   Walk with assistance use walker or cane as needed     Lifting restrictions    Complete by:  As directed   No lifting for 2 weeks     Resume previous diet    Complete by:  As directed            Discharge Diagnosis:  Right carotid artery stenosis I65.21  Secondary Diagnosis: Patient Active Problem List  Diagnosis Date Noted  . Prediabetes 06/23/2015  . Depression 06/23/2015  . Carotid stenosis 10/12/2014  . Lung nodule 07/07/2014  . Sleep disorder 04/09/2014  . Orthostatic hypotension 06/05/2013  . Dizziness 01/20/2013  . Mesenteric artery stenosis ([place]) 01/13/2013  . Symptomatic bradycardia 12/03/2012  . Thoracic aneurysm without mention of rupture 11/04/2012  . Chronic mesenteric ischemia ([place]) 10/08/2012  . Memory deficit 06/20/2012  . Irritable bowel syndrome 06/20/2012  . Ocular myasthenia gravis ([place]) 06/20/2012  . PVD (peripheral vascular disease) ([place]) 06/20/2012  . Hereditary and idiopathic  peripheral neuropathy 05/22/2012  . Syncope 08/28/2011  . Labial cyst 12/13/2010  . UNSPECIFIED ANEMIA 07/22/2010  . EXOPHTHALMOS 10/18/2009  . CAROTID ARTERY STENOSIS 08/17/2009  . ABDOMINAL BRUIT 08/17/2009  . ANEMIA-IRON DEFICIENCY 09/11/2008  . Coronary atherosclerosis 09/05/2008  . Hypothyroidism 05/26/2008  . VITAMIN D DEFICIENCY 05/26/2008  . Osteopenia 05/12/2008  . CHOLELITHIASIS 12/06/2007  . DIVERTICULOSIS, COLON 12/05/2007  . Other abnormal glucose 08/19/2007  . HYPERLIPIDEMIA 08/19/2007  . OTHER EMPHYSEMA 08/19/2007  . CIGARETTE SMOKER 02/06/2007  . Essential hypertension 02/06/2007  . COLONIC POLYPS 11/21/2004   Past Medical History  Diagnosis Date  . COPD (chronic obstructive pulmonary disease) ([place])   . Hypothyroidism     affecting the left eye, proptosis  . CAD (coronary artery disease)     Dr Percival Spanish  . HTN (hypertension)   . Iron deficiency anemia   . Ocular myasthenia gravis ([place])     Dr Jannifer Franklin  . Syncope 1998  . Diverticulosis   . GERD (gastroesophageal reflux disease) 09/15/1991    Dr Sharlett Iles  . Hyperplastic polyps of stomach 11/2007    colonoscopy  . HLD (hyperlipidemia)   . Cervical spine fracture ([place])   . Hiatal hernia 09/15/1991  . Gastritis 09/15/1991  . Adenomatous colon polyp   . Myasthenia gravis ([place])     With ocular features  . Dyslipidemia   . Hypertension   . Memory loss   . Hip fracture, right ([place])   . Orthostatic hypotension 06/05/2013  . Mesenteric artery stenosis ([place])   . Macular degeneration of left eye   . Carotid artery occlusion   . Myocardial infarction ([place]) 1993  . Acute MI inferior subsequent episode care [facility]) 1993    PTCA RCA  . Shortness of breath dyspnea     occ  . Pneumonia     hx  . Depression   . Arthritis       Medication List    TAKE these medications        aspirin 81 MG tablet  Take 81 mg by mouth daily.     clonazePAM 1 MG tablet  Commonly known as:  KLONOPIN  Take 0.5 tablets (0.5 mg  total) by mouth at bedtime.     clopidogrel 75 MG tablet  Commonly known as:  PLAVIX  Take 1 tablet (75 mg total) by mouth daily.     donepezil 10 MG tablet  Commonly known as:  ARICEPT  Take 1 tablet (10 mg total) by mouth daily.     gabapentin 300 MG capsule  Commonly known as:  NEURONTIN  TAKE ONE CAPSULE TWICE DAILY AND 2 AT NIGHT = FOUR TOTAL DAILY.     levothyroxine 112 MCG tablet  Commonly known as:  SYNTHROID, LEVOTHROID  TAKE 1 TABLET EVERY DAY EXCEPT TAKE 1/2 TABLET ON TUESDAY AND THURSDAY     lisinopril 5 MG tablet  Commonly known as:  PRINIVIL,ZESTRIL  TAKE 1 TABLET TWICE DAILY  oxyCODONE-acetaminophen 5-325 MG tablet  Commonly known as:  ROXICET  Take 1 tablet by mouth every 6 (six) hours as needed.     pravastatin 40 MG tablet  Commonly known as:  PRAVACHOL  TAKE 1 TABLET EVERY DAY     sertraline 100 MG tablet  Commonly known as:  ZOLOFT  Take 1 tablet (100 mg total) by mouth daily.     SYSTANE 0.4-0.3 % Gel ophthalmic gel  Generic drug:  Polyethyl Glycol-Propyl Glycol  Place 1 drop into both eyes daily as needed (for dry eyes).        5-325 Percocet #20 No Refill  Disposition: Home  Patient's condition: is Good  Follow up: 1. Dr. Trula Slade in 2 weeks.   Virgina Jock, PA-C [facility] [phone]  --- For [facility] use --- Instructions: Press F2 to tab through selections.  Delete question if not applicable.   Modified Rankin score at D/C (0-6): 0  IV medication needed for:  1. Hypertension: No 2. Hypotension: No  Post-op Complications: No  1. Post-op CVA or TIA: No  2. CN injury: No  3. Myocardial infarction: No  4.  CHF: No  5.  Dysrhythmia (new): No  6. Wound infection: No  7. Reperfusion symptoms: No  8. Return to OR: No  Discharge medications: Statin use:  Yes If No: '[ ]'$  For Medical reasons, '[ ]'$  Non-compliant, '[ ]'$  Not-indicated ASA use:  Yes  If No: '[ ]'$  For Medical reasons, '[ ]'$   Non-compliant, '[ ]'$  Not-indicated Beta blocker use:  No If No: '[ ]'$  For Medical reasons, '[ ]'$  Non-compliant, Valu.Nieves ] Not-indicated ACE-Inhibitor use:  Yes If No: '[ ]'$  For Medical reasons, '[ ]'$  Non-compliant, '[ ]'$  Not-indicated P2Y12 Antagonist use: Yes, '[X]'$  Plavix, '[ ]'$  Plasugrel, '[ ]'$  Ticlopinine, '[ ]'$  Ticagrelor, '[ ]'$  Other, '[ ]'$  No for medical reason, '[ ]'$  Non-compliant, '[ ]'$  Not-indicated Anti-coagulant use:  No, '[ ]'$  Warfarin, '[ ]'$  Rivaroxaban, '[ ]'$  Dabigatran, '[ ]'$  Other, '[ ]'$  No for medical reason, '[ ]'$  Non-compliant, '[X]'$  Not-indicated  Kayla Checkovich, PA-S  Virgina Jock, PA-C

## 2015-08-19 ENCOUNTER — Telehealth: Payer: Self-pay | Admitting: Surgery

## 2015-08-19 NOTE — Telephone Encounter (Signed)
Spoke with patient to inform of the follow up appt for 2/13, dpm

## 2015-08-19 NOTE — Telephone Encounter (Signed)
-----   Message from Mena Goes, RN sent at 08/13/2015  9:04 AM EST ----- Regarding: schedule   ----- Message -----    From: Alvia Grove, PA-C    Sent: 08/13/2015   8:40 AM      To: Vvs Charge Pool  S/p right CEA 08/02/15  F/u with Dr. Trula Slade in 2 weeks  Thanks Maudie Mercury

## 2015-08-24 ENCOUNTER — Telehealth: Payer: Self-pay | Admitting: *Deleted

## 2015-08-24 ENCOUNTER — Encounter: Payer: Self-pay | Admitting: Surgery

## 2015-08-24 MED ORDER — LEVOTHYROXINE SODIUM 112 MCG PO TABS: ORAL_TABLET | ORAL | Status: DC

## 2015-08-24 NOTE — Telephone Encounter (Signed)
Receive call pt states she need refills on her Levothyroxine. [facility]ified [facility].../lmb

## 2015-08-27 DIAGNOSIS — H5022 Vertical strabismus, left eye: Secondary | ICD-10-CM | POA: Diagnosis not present

## 2015-08-27 DIAGNOSIS — H5032 Intermittent alternating esotropia: Secondary | ICD-10-CM | POA: Diagnosis not present

## 2015-08-30 ENCOUNTER — Encounter: Payer: Self-pay | Admitting: Surgery

## 2015-08-30 ENCOUNTER — Ambulatory Visit (INDEPENDENT_AMBULATORY_CARE_PROVIDER_SITE_OTHER): Payer: PPO | Admitting: Surgery

## 2015-08-30 VITALS — BP 168/92 | HR 73 | Temp 97.6°F | Resp 16 | Ht 64.0 in | Wt 118.0 lb

## 2015-08-30 DIAGNOSIS — I6523 Occlusion and stenosis of bilateral carotid arteries: Secondary | ICD-10-CM
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
## 2015-08-30 NOTE — Progress Notes (Signed)
Filed Vitals:   08/30/15 0924 08/30/15 0927 08/30/15 0930  BP: 172/90 122/76 168/92  Pulse: 74 73 73  Temp:  97.6 F (36.4 C)   TempSrc:  Oral   Resp:  16   Height:  '5\' 4"'$  (1.626 m)   Weight:  118 lb (53.524 kg)   SpO2:  98%

## 2015-09-01 NOTE — Addendum Note (Signed)
Addended by: Mena Goes on: 09/01/2015 10:12 AM   Modules accepted: Orders

## 2015-09-16 ENCOUNTER — Other Ambulatory Visit: Payer: Self-pay | Admitting: Internal Medicine

## 2015-09-16 ENCOUNTER — Telehealth: Payer: Self-pay | Admitting: Emergency Medicine

## 2015-09-16 NOTE — Telephone Encounter (Signed)
RX for Clonazepam faxed to [facility]

## 2015-09-16 NOTE — Telephone Encounter (Signed)
[facility] by Terence Lux...[name]

## 2015-09-29 ENCOUNTER — Other Ambulatory Visit: Payer: PPO

## 2015-10-01 ENCOUNTER — Other Ambulatory Visit: Payer: Self-pay | Admitting: *Deleted

## 2015-10-01 MED ORDER — PRAVASTATIN SODIUM 40 MG PO TABS: 40.0000 mg | ORAL_TABLET | Freq: Every day | ORAL | Status: DC

## 2015-10-01 MED ORDER — LISINOPRIL 5 MG PO TABS: 5.0000 mg | ORAL_TABLET | Freq: Two times a day (BID) | ORAL | Status: DC

## 2015-10-01 NOTE — Telephone Encounter (Signed)
Pt left msg on triage requesting refills on her Pravsstatin & [facility]inopril [facility].../lmb

## 2015-10-11 DIAGNOSIS — H5022 Vertical strabismus, left eye: Secondary | ICD-10-CM | POA: Diagnosis not present

## 2015-10-15 NOTE — Progress Notes (Signed)
Chart reviewed by Dr Landry Dyke, due to patient's extensive cardiovascular hx, she will be better served being done at [place]. Dr Serita Grit office notified.

## 2015-10-22 ENCOUNTER — Other Ambulatory Visit: Payer: Self-pay | Admitting: *Deleted

## 2015-10-22 ENCOUNTER — Other Ambulatory Visit: Payer: Self-pay | Admitting: Internal Medicine

## 2015-10-22 DIAGNOSIS — K551 Chronic vascular disorders of intestine: Secondary | ICD-10-CM

## 2015-10-22 MED ORDER — CLOPIDOGREL BISULFATE 75 MG PO TABS: 75.0000 mg | ORAL_TABLET | Freq: Every day | ORAL | Status: DC

## 2015-10-25 NOTE — Telephone Encounter (Signed)
Refill faxed to [facility]

## 2015-10-27 ENCOUNTER — Ambulatory Visit: Payer: Self-pay | Admitting: Ophthalmology

## 2015-10-27 ENCOUNTER — Encounter (HOSPITAL_COMMUNITY): Payer: Self-pay | Admitting: *Deleted

## 2015-10-27 NOTE — H&P (Signed)
  Date of examination:  10/11/15  Indication for surgery: diplopia secondary to strabismus  Pertinent past medical history:  Past Medical History  Diagnosis Date  . COPD (chronic obstructive pulmonary disease) ([place])   . Hypothyroidism     affecting the left eye, proptosis  . CAD (coronary artery disease)     Dr Percival Spanish  . HTN (hypertension)   . Iron deficiency anemia   . Ocular myasthenia gravis ([place])     Dr Jannifer Franklin  . Syncope 1998  . Diverticulosis   . GERD (gastroesophageal reflux disease) 09/15/1991    Dr Sharlett Iles  . Hyperplastic polyps of stomach 11/2007    colonoscopy  . HLD (hyperlipidemia)   . Cervical spine fracture ([place])   . Hiatal hernia 09/15/1991  . Gastritis 09/15/1991  . Adenomatous colon polyp   . Myasthenia gravis ([place])     With ocular features  . Dyslipidemia   . Hypertension   . Memory loss   . Hip fracture, right ([place])   . Orthostatic hypotension 06/05/2013  . Mesenteric artery stenosis ([place])   . Macular degeneration of left eye   . Carotid artery occlusion   . Myocardial infarction ([place]) 1993  . Acute MI inferior subsequent episode care [facility]) 1993    PTCA RCA  . Shortness of breath dyspnea     occ  . Pneumonia     hx  . Depression   . Arthritis   . PONV (postoperative nausea and vomiting)   . Strabismus     left eye    Pertinent ocular history:  Diplopia for "years", cannot relay horizontal vs. vertical. Good results with half-strength Fresnel OS.  Pertinent family history:  Family History  Problem Relation Age of Onset  . Hypothyroidism Sister     X22  . Throat cancer Mother     ? thyroid cancer  . Cancer Mother   . Emphysema Father   . Diabetes Father   . Heart attack Father 10  . Colon cancer Brother   . Cerebral aneurysm Brother   . Cancer Brother     Ear  . Diabetes Paternal Grandmother   . Diabetes Paternal Grandfather   . Diabetes Maternal Aunt     General:  Healthy appearing patient in no distress.  Scar from CEA R neck  healed well.  Eyes:    Acuity OD 20/25-  OS 20/60  cc   External: Within normal limits     Anterior segment: senile arcus OU  Motility:   15pd LHT, 6pd ET  Fundus: Normal     Heart: Regular rate and rhythm  Lungs: Clear to auscultation     Abdomen: Soft, nontender, normal bowel sounds     Impression:82yo female with bothersome diplopia which was responsive and stable with Fresnel prism  Plan: Strabismus surgery  Alishia Lebo

## 2015-10-27 NOTE — Anesthesia Preprocedure Evaluation (Addendum)
Anesthesia Evaluation  Patient identified by MRN, date of birth, ID band Patient awake    Reviewed: Allergy & Precautions, NPO status , Patient's Chart, lab work & pertinent test results  History of Anesthesia Complications (+) PONV and history of anesthetic complications  Airway Mallampati: I  TM Distance: >3 FB Neck ROM: Full    Dental  (+) Edentulous Upper, Edentulous Lower   Pulmonary COPD, Current Smoker,     + wheezing      Cardiovascular hypertension, Pt. on medications + CAD, + Past MI and + Peripheral Vascular Disease   Rhythm:Regular Rate:Normal     Neuro/Psych PSYCHIATRIC DISORDERS Depression  Neuromuscular disease    GI/Hepatic hiatal hernia, GERD  ,  Endo/Other  Hypothyroidism   Renal/GU   negative genitourinary   Musculoskeletal  (+) Arthritis ,   Abdominal Normal abdominal exam  (+)   Peds negative pediatric ROS (+)  Hematology   Anesthesia Other Findings - Myasthenia Gravis - HLD   Reproductive/Obstetrics negative OB ROS                            Lab Results  Component Value Date   WBC 7.1 08/13/2015   HGB 11.0* 08/13/2015   HCT 35.2* 08/13/2015   MCV 91.2 08/13/2015   PLT 114* 08/13/2015   Lab Results  Component Value Date   CREATININE 0.91 08/13/2015   BUN 13 08/13/2015   NA 140 08/13/2015   K 4.4 08/13/2015   CL 111 08/13/2015   CO2 25 08/13/2015     Anesthesia Physical Anesthesia Plan  ASA: III  Anesthesia Plan: General   Post-op Pain Management:    Induction: Intravenous  Airway Management Planned: Oral ETT  Additional Equipment:   Intra-op Plan:   Post-operative Plan: Extubation in OR  Informed Consent: I have reviewed the patients History and Physical, chart, labs and discussed the procedure including the risks, benefits and alternatives for the proposed anesthesia with the patient or authorized representative who has indicated  his/her understanding and acceptance.   Dental advisory given  Plan Discussed with: CRNA  Anesthesia Plan Comments:         Anesthesia Quick Evaluation

## 2015-10-27 NOTE — Progress Notes (Signed)
Dr. Posey Pronto stated that it is okay for pt to take Aspirin and Plavix on DOS. Pt made aware and verbalized understanding.

## 2015-10-28 ENCOUNTER — Ambulatory Visit (HOSPITAL_COMMUNITY): Payer: PPO | Admitting: Anesthesiology

## 2015-10-28 ENCOUNTER — Encounter (HOSPITAL_COMMUNITY)
Admission: RE | Disposition: A | Discharge status: Home / Self Care | Payer: Self-pay | Source: Ambulatory Visit | Attending: Ophthalmology

## 2015-10-28 ENCOUNTER — Ambulatory Visit (HOSPITAL_COMMUNITY)
Admission: RE | Admit: 2015-10-28 | Discharge: 2015-10-28 | Disposition: A | Discharge status: Home / Self Care | Payer: PPO | Source: Ambulatory Visit | Attending: Ophthalmology | Admitting: Ophthalmology

## 2015-10-28 ENCOUNTER — Encounter (HOSPITAL_COMMUNITY): Payer: Self-pay | Admitting: *Deleted

## 2015-10-28 DIAGNOSIS — I2510 Atherosclerotic heart disease of native coronary artery without angina pectoris: Secondary | ICD-10-CM | POA: Diagnosis not present

## 2015-10-28 DIAGNOSIS — J449 Chronic obstructive pulmonary disease, unspecified: Secondary | ICD-10-CM | POA: Insufficient documentation

## 2015-10-28 DIAGNOSIS — I739 Peripheral vascular disease, unspecified: Secondary | ICD-10-CM | POA: Insufficient documentation

## 2015-10-28 DIAGNOSIS — Z79899 Other long term (current) drug therapy: Secondary | ICD-10-CM | POA: Diagnosis not present

## 2015-10-28 DIAGNOSIS — G7000 Myasthenia gravis without (acute) exacerbation: Secondary | ICD-10-CM | POA: Insufficient documentation

## 2015-10-28 DIAGNOSIS — I252 Old myocardial infarction: Secondary | ICD-10-CM | POA: Diagnosis not present

## 2015-10-28 DIAGNOSIS — E785 Hyperlipidemia, unspecified: Secondary | ICD-10-CM | POA: Diagnosis not present

## 2015-10-28 DIAGNOSIS — H5022 Vertical strabismus, left eye: Secondary | ICD-10-CM | POA: Diagnosis not present

## 2015-10-28 DIAGNOSIS — I10 Essential (primary) hypertension: Secondary | ICD-10-CM | POA: Diagnosis not present

## 2015-10-28 DIAGNOSIS — F17200 Nicotine dependence, unspecified, uncomplicated: Secondary | ICD-10-CM | POA: Insufficient documentation

## 2015-10-28 DIAGNOSIS — H5000 Unspecified esotropia: Secondary | ICD-10-CM | POA: Diagnosis not present

## 2015-10-28 DIAGNOSIS — E039 Hypothyroidism, unspecified: Secondary | ICD-10-CM | POA: Diagnosis not present

## 2015-10-28 DIAGNOSIS — F329 Major depressive disorder, single episode, unspecified: Secondary | ICD-10-CM | POA: Diagnosis not present

## 2015-10-28 HISTORY — DX: Other specified postprocedural states: R11.2

## 2015-10-28 HISTORY — DX: Unspecified strabismus: H50.9

## 2015-10-28 HISTORY — PX: STRABISMUS SURGERY: SHX218

## 2015-10-28 HISTORY — DX: Other specified postprocedural states: Z98.890

## 2015-10-28 LAB — CBC
HCT: 41 % (ref 36.0–46.0)
Hemoglobin: 12.9 g/dL (ref 12.0–15.0)
MCH: 28.2 pg (ref 26.0–34.0)
MCHC: 31.5 g/dL (ref 30.0–36.0)
MCV: 89.5 fL (ref 78.0–100.0)
Platelets: 172 10*3/uL (ref 150–400)
RBC: 4.58 MIL/uL (ref 3.87–5.11)
RDW: 13.7 % (ref 11.5–15.5)
WBC: 6.4 10*3/uL (ref 4.0–10.5)

## 2015-10-28 LAB — BASIC METABOLIC PANEL
Anion gap: 12 (ref 5–15)
BUN: 21 mg/dL — ABNORMAL HIGH (ref 6–20)
CO2: 25 mmol/L (ref 22–32)
Calcium: 8.8 mg/dL — ABNORMAL LOW (ref 8.9–10.3)
Chloride: 103 mmol/L (ref 101–111)
Creatinine, Ser: 0.94 mg/dL (ref 0.44–1.00)
GFR calc Af Amer: >60 mL/min (ref >60)
GFR calc non Af Amer: 56 mL/min — ABNORMAL LOW (ref >60)
Glucose, Bld: 84 mg/dL (ref 65–99)
Potassium: 4.4 mmol/L (ref 3.5–5.1)
Sodium: 140 mmol/L (ref 135–145)

## 2015-10-28 SURGERY — REPAIR STRABISMUS
Anesthesia: General | Site: Eye | Laterality: Left

## 2015-10-28 MED ORDER — PHENYLEPHRINE 40 MCG/ML (10ML) SYRINGE FOR IV PUSH (FOR BLOOD PRESSURE SUPPORT)
PREFILLED_SYRINGE | INTRAVENOUS | Status: AC
  Filled 2015-10-28: qty 10

## 2015-10-28 MED ORDER — ONDANSETRON HCL 4 MG/2ML IJ SOLN
INTRAMUSCULAR | Status: DC | PRN
  Administered 2015-10-28: 4 mg via INTRAVENOUS

## 2015-10-28 MED ORDER — DEXAMETHASONE SODIUM PHOSPHATE 4 MG/ML IJ SOLN
INTRAMUSCULAR | Status: DC | PRN
  Administered 2015-10-28: 10 mg via INTRAVENOUS

## 2015-10-28 MED ORDER — EPHEDRINE SULFATE 50 MG/ML IJ SOLN
INTRAMUSCULAR | Status: DC | PRN
  Administered 2015-10-28 (×2): 5 mg via INTRAVENOUS
  Administered 2015-10-28: 10 mg via INTRAVENOUS

## 2015-10-28 MED ORDER — TOBRAMYCIN-DEXAMETHASONE 0.3-0.1 % OP OINT: 1.0000 "application " | TOPICAL_OINTMENT | Freq: Three times a day (TID) | OPHTHALMIC | Status: DC

## 2015-10-28 MED ORDER — LIDOCAINE HCL (CARDIAC) 20 MG/ML IV SOLN
INTRAVENOUS | Status: DC | PRN
Start: 2015-10-28 — End: 2015-10-28
  Administered 2015-10-28: 60 mg via INTRAVENOUS

## 2015-10-28 MED ORDER — 0.9 % SODIUM CHLORIDE (POUR BTL) OPTIME
TOPICAL | Status: DC | PRN
  Administered 2015-10-28: 200 mL

## 2015-10-28 MED ORDER — STERILE WATER FOR IRRIGATION IR SOLN
Status: DC | PRN
  Administered 2015-10-28: 200 mL

## 2015-10-28 MED ORDER — SUCCINYLCHOLINE CHLORIDE 20 MG/ML IJ SOLN
INTRAMUSCULAR | Status: DC | PRN
  Administered 2015-10-28: 100 mg via INTRAVENOUS

## 2015-10-28 MED ORDER — PROPOFOL 10 MG/ML IV BOLUS
INTRAVENOUS | Status: DC | PRN
  Administered 2015-10-28: 100 mg via INTRAVENOUS
  Administered 2015-10-28: 50 mg via INTRAVENOUS

## 2015-10-28 MED ORDER — FENTANYL CITRATE (PF) 100 MCG/2ML IJ SOLN: 25.0000 ug | INTRAMUSCULAR | Status: DC | PRN

## 2015-10-28 MED ORDER — MEPERIDINE HCL 25 MG/ML IJ SOLN: 6.2500 mg | INTRAMUSCULAR | Status: DC | PRN

## 2015-10-28 MED ORDER — BSS IO SOLN
INTRAOCULAR | Status: AC
  Filled 2015-10-28: qty 15

## 2015-10-28 MED ORDER — SUCCINYLCHOLINE CHLORIDE 20 MG/ML IJ SOLN
INTRAMUSCULAR | Status: AC
  Filled 2015-10-28: qty 1

## 2015-10-28 MED ORDER — ONDANSETRON HCL 4 MG/2ML IJ SOLN
INTRAMUSCULAR | Status: AC
  Filled 2015-10-28: qty 2

## 2015-10-28 MED ORDER — STERILE WATER FOR INJECTION IJ SOLN
INTRAMUSCULAR | Status: AC
  Filled 2015-10-28: qty 10

## 2015-10-28 MED ORDER — GLYCOPYRROLATE 0.2 MG/ML IJ SOLN
INTRAMUSCULAR | Status: DC | PRN
  Administered 2015-10-28: 0.2 mg via INTRAVENOUS

## 2015-10-28 MED ORDER — PHENYLEPHRINE HCL 2.5 % OP SOLN
1.0000 [drp] | OPHTHALMIC | Status: AC | PRN
  Administered 2015-10-28 (×3): 1 [drp] via OPHTHALMIC
  Filled 2015-10-28: qty 2

## 2015-10-28 MED ORDER — PROPOFOL 10 MG/ML IV BOLUS
INTRAVENOUS | Status: AC
  Filled 2015-10-28: qty 20

## 2015-10-28 MED ORDER — TOBRAMYCIN-DEXAMETHASONE 0.3-0.1 % OP OINT
1.0000 | TOPICAL_OINTMENT | Freq: Three times a day (TID) | OPHTHALMIC | Status: DC
Start: 2015-10-28 — End: 2015-10-28

## 2015-10-28 MED ORDER — TOBRAMYCIN-DEXAMETHASONE 0.3-0.1 % OP OINT
TOPICAL_OINTMENT | OPHTHALMIC | Status: AC
  Filled 2015-10-28: qty 3.5

## 2015-10-28 MED ORDER — SODIUM CHLORIDE 0.9 % IV SOLN
INTRAVENOUS | Status: DC
  Administered 2015-10-28: 08:00:00 via INTRAVENOUS

## 2015-10-28 MED ORDER — BUPIVACAINE HCL (PF) 0.75 % IJ SOLN
INTRAMUSCULAR | Status: AC
  Filled 2015-10-28: qty 10

## 2015-10-28 MED ORDER — BUPIVACAINE HCL (PF) 0.75 % IJ SOLN
INTRAMUSCULAR | Status: DC | PRN
Start: 2015-10-28 — End: 2015-10-28
  Administered 2015-10-28: 1.5 mL

## 2015-10-28 MED ORDER — EPHEDRINE SULFATE 50 MG/ML IJ SOLN
INTRAMUSCULAR | Status: AC
  Filled 2015-10-28: qty 1

## 2015-10-28 MED ORDER — FENTANYL CITRATE (PF) 250 MCG/5ML IJ SOLN
INTRAMUSCULAR | Status: DC | PRN
  Administered 2015-10-28: 25 ug via INTRAVENOUS
  Administered 2015-10-28: 50 ug via INTRAVENOUS

## 2015-10-28 MED ORDER — LACTATED RINGERS IV SOLN
INTRAVENOUS | Status: DC | PRN
  Administered 2015-10-28 (×2): via INTRAVENOUS

## 2015-10-28 MED ORDER — BSS IO SOLN
INTRAOCULAR | Status: DC | PRN
  Administered 2015-10-28: 15 mL via INTRAOCULAR

## 2015-10-28 MED ORDER — LACTATED RINGERS IV SOLN: INTRAVENOUS | Status: DC

## 2015-10-28 MED ORDER — FENTANYL CITRATE (PF) 250 MCG/5ML IJ SOLN
INTRAMUSCULAR | Status: AC
  Filled 2015-10-28: qty 5

## 2015-10-28 MED ORDER — LIDOCAINE HCL (CARDIAC) 20 MG/ML IV SOLN
INTRAVENOUS | Status: AC
  Filled 2015-10-28: qty 5

## 2015-10-28 MED ORDER — PHENYLEPHRINE HCL 10 MG/ML IJ SOLN
INTRAMUSCULAR | Status: DC | PRN
  Administered 2015-10-28 (×3): 80 ug via INTRAVENOUS
  Administered 2015-10-28: 120 ug via INTRAVENOUS
  Administered 2015-10-28 (×3): 80 ug via INTRAVENOUS

## 2015-10-28 SURGICAL SUPPLY — 36 items
APPLICATOR DR MATTHEWS STRL (MISCELLANEOUS) IMPLANT
BLADE SURG 15 STRL LF DISP TIS (BLADE) IMPLANT
BLADE SURG 15 STRL SS (BLADE)
BNDG CONFORM 3 STRL LF (GAUZE/BANDAGES/DRESSINGS) IMPLANT
CAUTERY EYE LOW TEMP 1300F FIN (OPHTHALMIC RELATED) IMPLANT
CORDS BIPOLAR (ELECTRODE) ×2 IMPLANT
COVER SURGICAL LIGHT HANDLE (MISCELLANEOUS) ×2 IMPLANT
DRAPE ORTHO SPLIT 77X108 STRL (DRAPES) ×1
DRAPE SURG 17X23 STRL (DRAPES) ×2 IMPLANT
DRAPE SURG ORHT 6 SPLT 77X108 (DRAPES) ×1 IMPLANT
GLOVE BIO SURGEON STRL SZ 6.5 (GLOVE) ×2 IMPLANT
GLOVE BIOGEL PI IND STRL 7.0 (GLOVE) ×2 IMPLANT
GLOVE BIOGEL PI INDICATOR 7.0 (GLOVE) ×2
GLOVE ECLIPSE 7.0 STRL STRAW (GLOVE) ×2 IMPLANT
GLOVE SURG SS PI 7.0 STRL IVOR (GLOVE) ×2 IMPLANT
GOWN BRE IMP SLV AUR LG STRL (GOWN DISPOSABLE) IMPLANT
GOWN BRE IMP SLV SIRUS LXLNG (GOWN DISPOSABLE) IMPLANT
KIT BASIN OR (CUSTOM PROCEDURE TRAY) ×2 IMPLANT
KIT ROOM TURNOVER OR (KITS) ×2 IMPLANT
NEEDLE 27GAX1X1/2 (NEEDLE) IMPLANT
NS IRRIG 1000ML POUR BTL (IV SOLUTION) ×2 IMPLANT
PACK CATARACT CUSTOM (CUSTOM PROCEDURE TRAY) ×2 IMPLANT
PAD ARMBOARD 7.5X6 YLW CONV (MISCELLANEOUS) ×2 IMPLANT
STRIP CLOSURE SKIN 1/2X4 (GAUZE/BANDAGES/DRESSINGS) IMPLANT
SUT CHROMIC 7 0 TG140 8 (SUTURE) ×2 IMPLANT
SUT MERSILENE 5 0 RD 1 DA (SUTURE) IMPLANT
SUT PLAIN 6 0 TG1408 (SUTURE) IMPLANT
SUT SILK 6 0 G 6 (SUTURE) IMPLANT
SUT VICRYL 6 0 S 14 UNDY (SUTURE) IMPLANT
SUT VICRYL 6 0 S 28 (SUTURE) ×4 IMPLANT
SUT VICRYL 6 0 UNDY PS 6 (SUTURE) IMPLANT
SUT VICRYL ABS 6-0 S29 18IN (SUTURE) IMPLANT
SYR 3ML LL SCALE MARK (SYRINGE) IMPLANT
SYRINGE 10CC LL (SYRINGE) IMPLANT
WATER STERILE IRR 1000ML POUR (IV SOLUTION) ×2 IMPLANT
WIPE INSTRUMENT VISIWIPE 73X73 (MISCELLANEOUS) IMPLANT

## 2015-10-28 NOTE — Anesthesia Procedure Notes (Signed)
Procedure Name: Intubation Date/Time: 10/28/2015 9:41 AM Performed by: Ollen Bowl Pre-anesthesia Checklist: Patient identified, Emergency Drugs available, Suction available, Patient being monitored and Timeout performed Patient Re-evaluated:Patient Re-evaluated prior to inductionOxygen Delivery Method: Circle system utilized and Simple face mask Preoxygenation: Pre-oxygenation with 100% oxygen Intubation Type: IV induction Ventilation: Mask ventilation without difficulty Laryngoscope Size: Miller and 2 Grade View: Grade I Tube type: Oral Tube size: 7.5 mm Number of attempts: 1 Airway Equipment and Method: Patient positioned with wedge pillow and Stylet Placement Confirmation: ETT inserted through vocal cords under direct vision,  positive ETCO2 and breath sounds checked- equal and bilateral Secured at: 20 cm Tube secured with: Tape Dental Injury: Teeth and Oropharynx as per pre-operative assessment

## 2015-10-28 NOTE — Discharge Instructions (Signed)
Diet: Clear liquids, advance to soft foods then regular diet as tolerated.  Pain control:   1)  Ibuprofen 600 mg by mouth every 6-8 hours as needed for pain  2)  Oxycodone 5/Acetaminophen 325 one or two by mouth every 4-6 hours as  needed for pain that is not resolved by ibuprofen  **Do not take additional acetaminophen (Tylenol) when taking the prescribed   medication  Eye medications:  Tobradex or Zylet eye drops or ointment, one drop or application in the operated eye(s) 3 times a day for 10 days.    Activity: No swimming for 1 week. It is OK to let water run over the face and eyes while showering or taking a bath, even during the first week.  No other restriction on exercise or activity.  Eye movement: The eyes may look very slightly crossed in or turned out. This is not unusual postoperatively and may happen up to two months after surgery while the muscles are healing. The eyes may be tired during the first few weeks after surgery; reading can be uncomfortable during the healing process but will not hurt the eyes.  Call Dr. Serita Grit office [phone] with any problems or concerns.

## 2015-10-28 NOTE — H&P (Signed)
Interval History and Physical Examination:  Brandi Raymond  10/28/2015  Date of Initial H&P: 10/11/15   The patient has been reexamined and the H&P has been reviewed. The patient has no new complaints. The indications for today's procedure remain valid.  There is no change in the plan of care. There are no medical contraindications for proceeding with today's surgery and we will go forward as planned.  Brandi Raymond, MARTHAMD

## 2015-10-28 NOTE — Transfer of Care (Signed)
Immediate Anesthesia Transfer of Care Note  Patient: Brandi Raymond  Procedure(s) Performed: Procedure(s): REPAIR STRABISMUS LEFT EYE (Left)  Patient Location: PACU  Anesthesia Type:General  Level of Consciousness: awake and alert   Airway & Oxygen Therapy: Patient Spontanous Breathing and Patient connected to nasal cannula oxygen  Post-op Assessment: Report given to RN and Post -op Vital signs reviewed and stable  Post vital signs: Reviewed and stable  Last Vitals:  Filed Vitals:   10/28/15 0751  BP: 143/68  Pulse: 62  Temp: 36.6 C  Resp: 18    Complications: No apparent anesthesia complications

## 2015-10-28 NOTE — Op Note (Signed)
10/28/2015  11:13 AM  PATIENT:  Brandi Raymond  82 y.o. female  PRE-OPERATIVE DIAGNOSIS:   1. Hypertropia left eye      2. Small angle esotropia  POST-OPERATIVE DIAGNOSIS:  1. Hypertropia left eye      2. Small angle esotropia  PROCEDURE:   1. Left superior rectus recession 4.22m    2. Left medial rectus tenotomy   SURGEON:  MAnnita Brod M.D.   ANESTHESIA:   local and general  COMPLICATIONS:None  DESCRIPTION OF PROCEDURE: The patient was taken to the operating room where She was identified by me. General anesthesia was induced without difficulty after placement of appropriate monitors. The patient was prepped and draped in standard sterile fashion. A lid speculum was placed in the left eye.  Through a superior fornix incision through conjunctiva and Tenon's fascia, the left superior rectus muscle was engaged on a series of muscle hooks and cleared of its fascial attachments. A Stevens hook was used to identify the superior oblique tendon and ensure it was not engaged on the muscle hook. The superior rectus tendon was secured with a double-armed 6-0 Vicryl suture with a double locking bite at each border of the muscle, 1 mm from the insertion. The muscle was disinserted, and was reattached to sclera at a measured distance of 4 millimeters posterior to the original insertion, using direct scleral passes in crossed swords fashion.  The suture ends were tied securely after the position of the muscle had been checked and found to be accurate.   Attention was then turned to the medial rectus. Through an incision immediately superior to the medial rectus through conjunctiva and Tenon's fascia, the left medial rectus muscle was engaged on a series of muscle hooks and cleared of its fascial attachments. The medial rectus tendon was secured with a double-armed 6-0 Vicryl suture with a double locking bite at the medial and superior border of the muscle, 1 mm from the insertion. The muscle was  halfway disinserted, allowed to hang back, and was reattached as a hang back superiorly to the original scleral insertion. The suture ends were tied securely after the position of the muscle had been checked and found to be securely tied where disinserted, and securely attached at the inferior original portion of the insertion.   Approximately 1.559mof 0.75% bupivacaine was injected peribulbar for postoperative anesthesia. Conjunctiva was closed with 2 6-0 Vicryl sutures. Tobradex ointment was placed in the left eye. The patient was awakened without difficulty and taken to the recovery room in stable condition, having suffered no intraoperative or immediate postoperative complications.  MaAnnita BrodM.D.  PATIENT DISPOSITION:  PACU - hemodynamically stable.

## 2015-10-28 NOTE — Anesthesia Postprocedure Evaluation (Signed)
Anesthesia Post Note  Patient: Brandi Raymond  Procedure(s) Performed: Procedure(s) (LRB): REPAIR STRABISMUS LEFT EYE (Left)  Patient location during evaluation: PACU Anesthesia Type: General Level of consciousness: awake and alert Pain management: pain level controlled Vital Signs Assessment: post-procedure vital signs reviewed and stable Respiratory status: spontaneous breathing, nonlabored ventilation, respiratory function stable and patient connected to nasal cannula oxygen Cardiovascular status: blood pressure returned to baseline and stable Postop Assessment: no signs of nausea or vomiting Anesthetic complications: no    Last Vitals:  Filed Vitals:   10/28/15 1238 10/28/15 1249  BP:  107/64  Pulse: 72 69  Temp:    Resp: 22 16    Last Pain: There were no vitals filed for this visit.               Effie Berkshire

## 2015-11-02 ENCOUNTER — Encounter (HOSPITAL_COMMUNITY): Payer: Self-pay | Admitting: Ophthalmology

## 2015-11-04 HISTORY — PX: EYE MUSCLE SURGERY: SHX370

## 2015-11-07 ENCOUNTER — Emergency Department (HOSPITAL_COMMUNITY)
Admission: EM | Admit: 2015-11-07 | Discharge: 2015-11-07 | Disposition: A | Discharge status: Home / Self Care | Payer: PPO | Attending: Emergency Medicine | Admitting: Emergency Medicine

## 2015-11-07 ENCOUNTER — Encounter (HOSPITAL_COMMUNITY): Payer: Self-pay | Admitting: Family Medicine

## 2015-11-07 ENCOUNTER — Emergency Department (HOSPITAL_COMMUNITY): Payer: PPO

## 2015-11-07 DIAGNOSIS — Z8719 Personal history of other diseases of the digestive system: Secondary | ICD-10-CM | POA: Insufficient documentation

## 2015-11-07 DIAGNOSIS — Z86010 Personal history of colonic polyps: Secondary | ICD-10-CM | POA: Diagnosis not present

## 2015-11-07 DIAGNOSIS — Z7902 Long term (current) use of antithrombotics/antiplatelets: Secondary | ICD-10-CM | POA: Insufficient documentation

## 2015-11-07 DIAGNOSIS — X58XXXA Exposure to other specified factors, initial encounter: Secondary | ICD-10-CM | POA: Diagnosis not present

## 2015-11-07 DIAGNOSIS — E039 Hypothyroidism, unspecified: Secondary | ICD-10-CM | POA: Diagnosis not present

## 2015-11-07 DIAGNOSIS — M25551 Pain in right hip: Secondary | ICD-10-CM

## 2015-11-07 DIAGNOSIS — Z79899 Other long term (current) drug therapy: Secondary | ICD-10-CM | POA: Insufficient documentation

## 2015-11-07 DIAGNOSIS — M25552 Pain in left hip: Secondary | ICD-10-CM | POA: Diagnosis not present

## 2015-11-07 DIAGNOSIS — Z9889 Other specified postprocedural states: Secondary | ICD-10-CM | POA: Diagnosis not present

## 2015-11-07 DIAGNOSIS — Z8701 Personal history of pneumonia (recurrent): Secondary | ICD-10-CM | POA: Diagnosis not present

## 2015-11-07 DIAGNOSIS — J449 Chronic obstructive pulmonary disease, unspecified: Secondary | ICD-10-CM | POA: Insufficient documentation

## 2015-11-07 DIAGNOSIS — I252 Old myocardial infarction: Secondary | ICD-10-CM | POA: Diagnosis not present

## 2015-11-07 DIAGNOSIS — S79912A Unspecified injury of left hip, initial encounter: Secondary | ICD-10-CM | POA: Diagnosis not present

## 2015-11-07 DIAGNOSIS — Z862 Personal history of diseases of the blood and blood-forming organs and certain disorders involving the immune mechanism: Secondary | ICD-10-CM | POA: Diagnosis not present

## 2015-11-07 DIAGNOSIS — F329 Major depressive disorder, single episode, unspecified: Secondary | ICD-10-CM | POA: Insufficient documentation

## 2015-11-07 DIAGNOSIS — Y998 Other external cause status: Secondary | ICD-10-CM | POA: Insufficient documentation

## 2015-11-07 DIAGNOSIS — M1990 Unspecified osteoarthritis, unspecified site: Secondary | ICD-10-CM | POA: Insufficient documentation

## 2015-11-07 DIAGNOSIS — Z7982 Long term (current) use of aspirin: Secondary | ICD-10-CM | POA: Diagnosis not present

## 2015-11-07 DIAGNOSIS — I10 Essential (primary) hypertension: Secondary | ICD-10-CM | POA: Insufficient documentation

## 2015-11-07 DIAGNOSIS — S79911A Unspecified injury of right hip, initial encounter: Secondary | ICD-10-CM | POA: Diagnosis not present

## 2015-11-07 DIAGNOSIS — Y9289 Other specified places as the place of occurrence of the external cause: Secondary | ICD-10-CM | POA: Insufficient documentation

## 2015-11-07 DIAGNOSIS — I2510 Atherosclerotic heart disease of native coronary artery without angina pectoris: Secondary | ICD-10-CM | POA: Diagnosis not present

## 2015-11-07 DIAGNOSIS — Y9389 Activity, other specified: Secondary | ICD-10-CM | POA: Diagnosis not present

## 2015-11-07 DIAGNOSIS — F17210 Nicotine dependence, cigarettes, uncomplicated: Secondary | ICD-10-CM | POA: Insufficient documentation

## 2015-11-07 DIAGNOSIS — Z8669 Personal history of other diseases of the nervous system and sense organs: Secondary | ICD-10-CM | POA: Diagnosis not present

## 2015-11-07 DIAGNOSIS — M25559 Pain in unspecified hip: Secondary | ICD-10-CM

## 2015-11-07 DIAGNOSIS — E785 Hyperlipidemia, unspecified: Secondary | ICD-10-CM | POA: Insufficient documentation

## 2015-11-07 MED ORDER — HYDROCODONE-ACETAMINOPHEN 5-325 MG PO TABS
1.0000 | ORAL_TABLET | Freq: Once | ORAL | Status: AC
  Administered 2015-11-07: 1 via ORAL
  Filled 2015-11-07: qty 1

## 2015-11-07 MED ORDER — HYDROCODONE-ACETAMINOPHEN 5-325 MG PO TABS: 1.0000 | ORAL_TABLET | Freq: Four times a day (QID) | ORAL | Status: DC | PRN
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
## 2015-11-07 NOTE — Discharge Instructions (Signed)
X-rays of the pelvis and hips without any bony injuries. Trial of the hydrocodone for the pain. Make an appointment to follow-up with your regular doctor. Return for any new or worse symptoms.

## 2015-11-07 NOTE — ED Notes (Signed)
Pt here for bilateral hip pain. sts she was getting up from the cough Friday night and heard both hips crack. Since has been in pain.

## 2015-11-08 ENCOUNTER — Telehealth: Payer: Self-pay | Admitting: Internal Medicine

## 2015-11-08 NOTE — Telephone Encounter (Signed)
Patient states she went to the ER and had xrays done.  She is having pain bad in her hip.  She was given pain meds but it is not touching her pain.  Can she be worked in for ER follow up?

## 2015-11-08 NOTE — Telephone Encounter (Signed)
Please advise 

## 2015-11-09 NOTE — Telephone Encounter (Signed)
Spoke with pt. Appt scheduled for Thursday 11/11/15

## 2015-11-09 NOTE — Telephone Encounter (Signed)
Yes, just fit her in

## 2015-11-11 ENCOUNTER — Other Ambulatory Visit: Payer: Self-pay | Admitting: Internal Medicine

## 2015-11-11 ENCOUNTER — Ambulatory Visit (INDEPENDENT_AMBULATORY_CARE_PROVIDER_SITE_OTHER)
Admission: RE | Admit: 2015-11-11 | Discharge: 2015-11-11 | Disposition: A | Discharge status: Home / Self Care | Payer: PPO | Source: Ambulatory Visit | Attending: Internal Medicine | Admitting: Internal Medicine

## 2015-11-11 ENCOUNTER — Ambulatory Visit (INDEPENDENT_AMBULATORY_CARE_PROVIDER_SITE_OTHER): Payer: PPO | Admitting: Internal Medicine

## 2015-11-11 ENCOUNTER — Encounter: Payer: Self-pay | Admitting: Internal Medicine

## 2015-11-11 VITALS — BP 164/80 | HR 72 | Temp 98.0°F | Resp 18 | Wt 112.0 lb

## 2015-11-11 DIAGNOSIS — M25559 Pain in unspecified hip: Secondary | ICD-10-CM

## 2015-11-11 DIAGNOSIS — M545 Low back pain, unspecified: Secondary | ICD-10-CM

## 2015-11-11 DIAGNOSIS — M546 Pain in thoracic spine: Secondary | ICD-10-CM

## 2015-11-11 DIAGNOSIS — M5136 Other intervertebral disc degeneration, lumbar region: Secondary | ICD-10-CM | POA: Diagnosis not present

## 2015-11-11 DIAGNOSIS — S3993XA Unspecified injury of pelvis, initial encounter: Secondary | ICD-10-CM | POA: Diagnosis not present

## 2015-11-11 DIAGNOSIS — M5134 Other intervertebral disc degeneration, thoracic region: Secondary | ICD-10-CM | POA: Diagnosis not present

## 2015-11-11 DIAGNOSIS — J449 Chronic obstructive pulmonary disease, unspecified: Secondary | ICD-10-CM | POA: Diagnosis not present

## 2015-11-11 MED ORDER — TRAMADOL HCL 50 MG PO TABS: 50.0000 mg | ORAL_TABLET | Freq: Three times a day (TID) | ORAL | Status: DC | PRN

## 2015-11-11 NOTE — Progress Notes (Signed)
Pre visit review using our clinic review tool, if applicable. No additional management support is needed unless otherwise documented below in the visit note. 

## 2015-11-11 NOTE — Patient Instructions (Signed)
Have x-rays done today.   Test(s) ordered today. Your results will be called to you after review.    We will try tramadol for your pain.  Only take when needed and if you pain is not controlled, let me know.
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
## 2015-11-12 ENCOUNTER — Telehealth: Payer: Self-pay

## 2015-11-12 NOTE — Telephone Encounter (Signed)
Please call patient back in regards

## 2015-11-12 NOTE — Telephone Encounter (Signed)
Spoke with pt to clarify about referrals that were entered. Pt states that her back pain will settle down and the flare back up. Instructed patient to try to rest and ice her back until she hears from Ortho, do you advise anything else for the pt.

## 2015-11-12 NOTE — Telephone Encounter (Signed)
Patient was wanting to speak with Brandi Raymond not Tammy.  Routed message to [facility].

## 2015-11-12 NOTE — Telephone Encounter (Signed)
Patient call back Brandi Raymond but only wanted to talk to you. Please follow up

## 2015-11-12 NOTE — Telephone Encounter (Signed)
Nothing else at this time

## 2015-11-15 ENCOUNTER — Telehealth: Payer: Self-pay | Admitting: Internal Medicine

## 2015-11-15 ENCOUNTER — Telehealth: Payer: Self-pay

## 2015-11-15 MED ORDER — SERTRALINE HCL 100 MG PO TABS: 100.0000 mg | ORAL_TABLET | Freq: Every day | ORAL | Status: DC

## 2015-11-15 NOTE — Telephone Encounter (Signed)
This pt called in and needs a refill on her   sertraline (ZOLOFT) 100 MG tablet [[id number]]       [facility] not mail order

## 2015-11-15 NOTE — Telephone Encounter (Signed)
Patient called and said she is still in so much pain in her back. She said she needs something done for it. And would like for doctor Burns nurse to call her back.

## 2015-11-15 NOTE — Telephone Encounter (Signed)
Spoke with pt. Per MD Pt should take 2 Hydrocodone every 6 hours. Will mail new RX, Pt is unable to get to office to pick up the RX.

## 2015-11-15 NOTE — Telephone Encounter (Signed)
Does she want something stronger for her pain?  Can we see if Dr Tamala Julian is able to see her soon - not sure when her ortho appt is.

## 2015-11-15 NOTE — Telephone Encounter (Signed)
Please advise 

## 2015-11-16 NOTE — Telephone Encounter (Signed)
Pt has an appt with Ortho 11/17/15. [facility] handled pt call.

## 2015-11-17 DIAGNOSIS — S335XXA Sprain of ligaments of lumbar spine, initial encounter: Secondary | ICD-10-CM | POA: Diagnosis not present

## 2015-11-18 ENCOUNTER — Telehealth: Payer: Self-pay | Admitting: Pulmonary Disease

## 2015-11-18 NOTE — Telephone Encounter (Signed)
IMAGING CXR PA/LAT 01/06/15 (personally reviewed by me): Hyperinflation with some barreling of the chest. Flattening of the diaphragms. No parenchymal nodule or opacity appreciated. No pleural effusion. Heart normal in size & mediastinum normal in contour.  CTA CHEST 10/16/12 (personally reviewed by me): 6 mm right middle lobe nodule. Apical predominant emphysematous changes noted. Small left pleural effusion. Soft tissue density present within mediastinum with dissection of aorta. No pericardial effusion.  LABS 10/28/15 CBC: 6.4/12.9/41.0/172 BMP: 140/4.4/103/25/21/0.94/84/8.8

## 2015-11-23 ENCOUNTER — Other Ambulatory Visit: Payer: Self-pay | Admitting: Internal Medicine

## 2015-11-23 NOTE — Telephone Encounter (Signed)
RX faxed to [facility] 

## 2015-11-26 ENCOUNTER — Institutional Professional Consult (permissible substitution): Payer: PPO | Admitting: Pulmonary Disease

## 2015-12-07 DIAGNOSIS — H532 Diplopia: Secondary | ICD-10-CM | POA: Diagnosis not present

## 2015-12-07 DIAGNOSIS — H353110 Nonexudative age-related macular degeneration, right eye, stage unspecified: Secondary | ICD-10-CM | POA: Diagnosis not present

## 2015-12-07 DIAGNOSIS — H353122 Nonexudative age-related macular degeneration, left eye, intermediate dry stage: Secondary | ICD-10-CM | POA: Diagnosis not present

## 2015-12-22 ENCOUNTER — Other Ambulatory Visit: Payer: Self-pay | Admitting: Internal Medicine

## 2015-12-23 NOTE — Telephone Encounter (Signed)
RX faxed to [facility] 

## 2016-01-19 ENCOUNTER — Ambulatory Visit: Payer: PPO | Admitting: Adult Health

## 2016-01-24 ENCOUNTER — Other Ambulatory Visit: Payer: Self-pay | Admitting: Internal Medicine

## 2016-01-24 NOTE — Telephone Encounter (Signed)
RX faxed to [facility] 

## 2016-02-02 DIAGNOSIS — H5005 Alternating esotropia: Secondary | ICD-10-CM | POA: Diagnosis not present

## 2016-02-16 ENCOUNTER — Encounter: Payer: Self-pay | Admitting: Adult Health

## 2016-02-16 ENCOUNTER — Ambulatory Visit (INDEPENDENT_AMBULATORY_CARE_PROVIDER_SITE_OTHER): Payer: PPO | Admitting: Adult Health

## 2016-02-16 VITALS — BP 110/62 | HR 76 | Ht 65.0 in | Wt 112.8 lb

## 2016-02-16 DIAGNOSIS — R413 Other amnesia: Secondary | ICD-10-CM

## 2016-02-16 DIAGNOSIS — G7000 Myasthenia gravis without (acute) exacerbation: Secondary | ICD-10-CM

## 2016-02-16 DIAGNOSIS — G2581 Restless legs syndrome: Secondary | ICD-10-CM

## 2016-02-16 NOTE — Progress Notes (Signed)
I have read the note, and I agree with the clinical assessment and plan.  Mansel Strother KEITH   
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
## 2016-02-16 NOTE — Patient Instructions (Signed)
Memory score improved. Will continue to monitor Continue gabapentin for restless legs If your symptoms worsen or you develop new symptoms please let us know.

## 2016-02-24 ENCOUNTER — Other Ambulatory Visit: Payer: Self-pay | Admitting: Internal Medicine

## 2016-02-29 ENCOUNTER — Other Ambulatory Visit: Payer: Self-pay | Admitting: Internal Medicine

## 2016-02-29 NOTE — Telephone Encounter (Signed)
Faxed [facility] to [facility].../lmb 

## 2016-04-08 ENCOUNTER — Other Ambulatory Visit: Payer: Self-pay | Admitting: Internal Medicine

## 2016-04-10 NOTE — Telephone Encounter (Signed)
rX faxed to [facility]

## 2016-05-01 ENCOUNTER — Ambulatory Visit: Payer: PPO | Admitting: Surgery

## 2016-05-01 ENCOUNTER — Encounter (HOSPITAL_COMMUNITY): Payer: PPO

## 2016-05-06 ENCOUNTER — Other Ambulatory Visit: Payer: Self-pay | Admitting: Internal Medicine

## 2016-05-10 ENCOUNTER — Other Ambulatory Visit: Payer: Self-pay | Admitting: Internal Medicine

## 2016-05-10 NOTE — Telephone Encounter (Signed)
RX faxed to [facility] 

## 2016-05-17 ENCOUNTER — Ambulatory Visit: Payer: PPO | Admitting: Internal Medicine

## 2016-05-20 ENCOUNTER — Other Ambulatory Visit: Payer: Self-pay | Admitting: Internal Medicine

## 2016-05-24 NOTE — Patient Instructions (Addendum)
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
## 2016-05-25 ENCOUNTER — Ambulatory Visit (INDEPENDENT_AMBULATORY_CARE_PROVIDER_SITE_OTHER): Payer: PPO | Admitting: Internal Medicine

## 2016-05-25 ENCOUNTER — Encounter: Payer: Self-pay | Admitting: Internal Medicine

## 2016-05-25 ENCOUNTER — Other Ambulatory Visit: Payer: Self-pay | Admitting: Internal Medicine

## 2016-05-25 ENCOUNTER — Other Ambulatory Visit: Payer: PPO

## 2016-05-25 ENCOUNTER — Other Ambulatory Visit (INDEPENDENT_AMBULATORY_CARE_PROVIDER_SITE_OTHER): Payer: PPO

## 2016-05-25 VITALS — BP 102/56 | HR 78 | Temp 98.1°F | Resp 16 | Wt 112.0 lb

## 2016-05-25 DIAGNOSIS — M8580 Other specified disorders of bone density and structure, unspecified site: Secondary | ICD-10-CM

## 2016-05-25 DIAGNOSIS — E782 Mixed hyperlipidemia: Secondary | ICD-10-CM | POA: Diagnosis not present

## 2016-05-25 DIAGNOSIS — I10 Essential (primary) hypertension: Secondary | ICD-10-CM

## 2016-05-25 DIAGNOSIS — E039 Hypothyroidism, unspecified: Secondary | ICD-10-CM

## 2016-05-25 DIAGNOSIS — R7303 Prediabetes: Secondary | ICD-10-CM | POA: Diagnosis not present

## 2016-05-25 DIAGNOSIS — H9202 Otalgia, left ear: Secondary | ICD-10-CM

## 2016-05-25 DIAGNOSIS — I6521 Occlusion and stenosis of right carotid artery: Secondary | ICD-10-CM

## 2016-05-25 DIAGNOSIS — F329 Major depressive disorder, single episode, unspecified: Secondary | ICD-10-CM

## 2016-05-25 DIAGNOSIS — F32A Depression, unspecified: Secondary | ICD-10-CM

## 2016-05-25 LAB — CBC WITH DIFFERENTIAL/PLATELET
Basophils Absolute: 0 10*3/uL (ref 0.0–0.1)
Basophils Relative: 0.6 % (ref 0.0–3.0)
Eosinophils Absolute: 0.1 10*3/uL (ref 0.0–0.7)
Eosinophils Relative: 1.6 % (ref 0.0–5.0)
HCT: 41.1 % (ref 36.0–46.0)
Hemoglobin: 13.7 g/dL (ref 12.0–15.0)
Lymphocytes Relative: 21.7 % (ref 12.0–46.0)
Lymphs Abs: 1.3 10*3/uL (ref 0.7–4.0)
MCHC: 33.3 g/dL (ref 30.0–36.0)
MCV: 85.8 fl (ref 78.0–100.0)
Monocytes Absolute: 0.6 10*3/uL (ref 0.1–1.0)
Monocytes Relative: 9.8 % (ref 3.0–12.0)
Neutro Abs: 4 10*3/uL (ref 1.4–7.7)
Neutrophils Relative %: 66.3 % (ref 43.0–77.0)
Platelets: 159 10*3/uL (ref 150.0–400.0)
RBC: 4.79 Mil/uL (ref 3.87–5.11)
RDW: 14.6 % (ref 11.5–15.5)
WBC: 6.1 10*3/uL (ref 4.0–10.5)

## 2016-05-25 LAB — HEMOGLOBIN A1C: Hgb A1c MFr Bld: 5.8 % (ref 4.6–6.5)

## 2016-05-25 LAB — COMPREHENSIVE METABOLIC PANEL
ALT: 10 U/L (ref 0–35)
AST: 19 U/L (ref 0–37)
Albumin: 3.9 g/dL (ref 3.5–5.2)
Alkaline Phosphatase: 61 U/L (ref 39–117)
BUN: 15 mg/dL (ref 6–23)
CO2: 30 mEq/L (ref 19–32)
Calcium: 9.1 mg/dL (ref 8.4–10.5)
Chloride: 105 mEq/L (ref 96–112)
Creatinine, Ser: 0.83 mg/dL (ref 0.40–1.20)
GFR: 70.16 mL/min (ref >60.00)
Glucose, Bld: 94 mg/dL (ref 70–99)
Potassium: 4.5 mEq/L (ref 3.5–5.1)
Sodium: 141 mEq/L (ref 135–145)
Total Bilirubin: 0.4 mg/dL (ref 0.2–1.2)
Total Protein: 6.6 g/dL (ref 6.0–8.3)

## 2016-05-25 LAB — VITAMIN D 25 HYDROXY (VIT D DEFICIENCY, FRACTURES): VITD: 41.05 ng/mL (ref 30.00–100.00)

## 2016-05-25 LAB — LIPID PANEL
Cholesterol: 214 mg/dL — ABNORMAL HIGH (ref 0–200)
HDL: 92 mg/dL (ref >39.00)
LDL Cholesterol: 105 mg/dL — ABNORMAL HIGH (ref 0–99)
NonHDL: 122.49
Total CHOL/HDL Ratio: 2
Triglycerides: 89 mg/dL (ref 0.0–149.0)
VLDL: 17.8 mg/dL (ref 0.0–40.0)

## 2016-05-25 LAB — TSH: TSH: 9.5 u[IU]/mL — ABNORMAL HIGH (ref 0.35–4.50)

## 2016-05-25 MED ORDER — SERTRALINE HCL 100 MG PO TABS: 100.0000 mg | ORAL_TABLET | Freq: Every day | ORAL | 3 refills | Status: DC

## 2016-05-25 MED ORDER — LEVOTHYROXINE SODIUM 112 MCG PO TABS: ORAL_TABLET | ORAL | 3 refills | Status: DC

## 2016-05-25 MED ORDER — PRAVASTATIN SODIUM 40 MG PO TABS: 40.0000 mg | ORAL_TABLET | Freq: Every day | ORAL | 3 refills | Status: DC

## 2016-05-25 MED ORDER — LEVOTHYROXINE SODIUM 112 MCG PO TABS: 112.0000 ug | ORAL_TABLET | Freq: Every day | ORAL | 3 refills | Status: DC

## 2016-05-25 MED ORDER — CALCIUM CARB-CHOLECALCIFEROL 500-400 MG-UNIT PO TABS: ORAL_TABLET | ORAL | Status: DC

## 2016-05-25 MED ORDER — LISINOPRIL 5 MG PO TABS: 5.0000 mg | ORAL_TABLET | Freq: Two times a day (BID) | ORAL | 3 refills | Status: DC

## 2016-05-25 NOTE — Assessment & Plan Note (Addendum)
Left ear exam normal Monitor

## 2016-05-25 NOTE — Assessment & Plan Note (Signed)
Check tsh  Titrate med dose if needed  

## 2016-05-25 NOTE — Assessment & Plan Note (Signed)
Not necessarily compliant with a diabetic diet Check A1c

## 2016-05-25 NOTE — Assessment & Plan Note (Signed)
Taking calcium and vitamin D No exercise Smoking Discussed DEXA-she will discuss at her next visit

## 2016-05-25 NOTE — Progress Notes (Signed)
Pre visit review using our clinic review tool, if applicable. No additional management support is needed unless otherwise documented below in the visit note. 

## 2016-05-25 NOTE — Assessment & Plan Note (Signed)
Following with vascular

## 2016-05-25 NOTE — Assessment & Plan Note (Signed)
Check lipid panel, CMP Continue pravastatin

## 2016-05-25 NOTE — Assessment & Plan Note (Addendum)
Blood pressure on the low side today  She is asymptomatic Continue current medications at current doses

## 2016-05-25 NOTE — Assessment & Plan Note (Signed)
Controlled, stable Continue current dose of medication  

## 2016-05-31 DIAGNOSIS — H02834 Dermatochalasis of left upper eyelid: Secondary | ICD-10-CM | POA: Diagnosis not present

## 2016-05-31 DIAGNOSIS — H532 Diplopia: Secondary | ICD-10-CM | POA: Diagnosis not present

## 2016-05-31 DIAGNOSIS — H01024 Squamous blepharitis left upper eyelid: Secondary | ICD-10-CM | POA: Diagnosis not present

## 2016-05-31 DIAGNOSIS — H02534 Eyelid retraction left upper eyelid: Secondary | ICD-10-CM | POA: Diagnosis not present

## 2016-05-31 DIAGNOSIS — H01025 Squamous blepharitis left lower eyelid: Secondary | ICD-10-CM | POA: Diagnosis not present

## 2016-05-31 DIAGNOSIS — H01021 Squamous blepharitis right upper eyelid: Secondary | ICD-10-CM | POA: Diagnosis not present

## 2016-05-31 DIAGNOSIS — H01022 Squamous blepharitis right lower eyelid: Secondary | ICD-10-CM | POA: Diagnosis not present

## 2016-05-31 DIAGNOSIS — H02831 Dermatochalasis of right upper eyelid: Secondary | ICD-10-CM | POA: Diagnosis not present

## 2016-06-01 ENCOUNTER — Other Ambulatory Visit: Payer: Self-pay

## 2016-06-01 ENCOUNTER — Telehealth: Payer: Self-pay | Admitting: Adult Health

## 2016-06-01 MED ORDER — GABAPENTIN 300 MG PO CAPS: ORAL_CAPSULE | ORAL | 2 refills | Status: DC

## 2016-06-01 NOTE — Telephone Encounter (Signed)
Refill done for medication Gabapentin sent to pharmacy.

## 2016-06-01 NOTE — Telephone Encounter (Signed)
Pt request refill for gabapentin (NEURONTIN) 300 MG capsule [facility]

## 2016-06-05 ENCOUNTER — Ambulatory Visit: Payer: PPO | Admitting: Surgery

## 2016-06-05 ENCOUNTER — Encounter (HOSPITAL_COMMUNITY): Payer: PPO

## 2016-06-06 DIAGNOSIS — H5032 Intermittent alternating esotropia: Secondary | ICD-10-CM | POA: Diagnosis not present

## 2016-06-19 ENCOUNTER — Other Ambulatory Visit: Payer: Self-pay | Admitting: Internal Medicine

## 2016-06-21 ENCOUNTER — Encounter: Payer: Self-pay | Admitting: Surgery

## 2016-06-26 ENCOUNTER — Ambulatory Visit (INDEPENDENT_AMBULATORY_CARE_PROVIDER_SITE_OTHER): Payer: PPO | Admitting: Surgery

## 2016-06-26 ENCOUNTER — Ambulatory Visit (HOSPITAL_COMMUNITY)
Admission: RE | Admit: 2016-06-26 | Discharge: 2016-06-26 | Disposition: A | Discharge status: Home / Self Care | Payer: PPO | Source: Ambulatory Visit | Attending: Surgery | Admitting: Surgery

## 2016-06-26 ENCOUNTER — Encounter: Payer: Self-pay | Admitting: Surgery

## 2016-06-26 VITALS — BP 98/74 | HR 74 | Temp 97.4°F | Resp 16 | Ht 65.0 in | Wt 109.0 lb

## 2016-06-26 DIAGNOSIS — I6523 Occlusion and stenosis of bilateral carotid arteries: Secondary | ICD-10-CM | POA: Diagnosis not present

## 2016-06-26 DIAGNOSIS — K551 Chronic vascular disorders of intestine: Secondary | ICD-10-CM | POA: Diagnosis not present

## 2016-06-26 LAB — VAS US CAROTID
LEFT ECA DIAS: 19 cm/s
Left CCA dist dias: 21 cm/s
Left CCA dist sys: 112 cm/s
Left CCA prox dias: -11 cm/s
Left CCA prox sys: -89 cm/s
Left ICA dist dias: -35 cm/s
Left ICA dist sys: -112 cm/s
Left ICA prox dias: -14 cm/s
Left ICA prox sys: -66 cm/s
RIGHT CCA MID DIAS: 12 cm/s
RIGHT ECA DIAS: -16 cm/s
Right CCA prox dias: -12 cm/s
Right CCA prox sys: -62 cm/s
Right cca dist sys: -101 cm/s

## 2016-06-26 NOTE — Addendum Note (Signed)
Addended by: Lianne Cure A on: 06/26/2016 01:06 PM   Modules accepted: Orders

## 2016-06-26 NOTE — Addendum Note (Signed)
Addended by: Lianne Cure A on: 06/26/2016 11:29 AM   Modules accepted: Orders
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
## 2016-07-15 ENCOUNTER — Other Ambulatory Visit: Payer: Self-pay | Admitting: Internal Medicine

## 2016-08-10 ENCOUNTER — Other Ambulatory Visit: Payer: Self-pay | Admitting: Internal Medicine

## 2016-08-10 NOTE — Telephone Encounter (Signed)
Not due for refill. Filled for 1 month with 1 refill on 07/15/16 so should not be due for refill until 09/13/16.
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
## 2016-08-11 ENCOUNTER — Other Ambulatory Visit: Payer: Self-pay | Admitting: Internal Medicine

## 2016-08-14 ENCOUNTER — Ambulatory Visit: Payer: PPO | Admitting: Surgery

## 2016-08-14 ENCOUNTER — Encounter (HOSPITAL_COMMUNITY): Payer: PPO

## 2016-08-15 ENCOUNTER — Other Ambulatory Visit: Payer: Self-pay | Admitting: Internal Medicine

## 2016-08-18 ENCOUNTER — Other Ambulatory Visit: Payer: Self-pay | Admitting: Internal Medicine

## 2016-08-22 ENCOUNTER — Encounter: Payer: Self-pay | Admitting: Cardiology

## 2016-08-22 ENCOUNTER — Ambulatory Visit: Payer: PPO | Admitting: Adult Health

## 2016-10-05 ENCOUNTER — Ambulatory Visit (INDEPENDENT_AMBULATORY_CARE_PROVIDER_SITE_OTHER): Payer: PPO | Admitting: Adult Health

## 2016-10-05 ENCOUNTER — Encounter: Payer: Self-pay | Admitting: Adult Health

## 2016-10-05 VITALS — BP 137/77 | HR 73 | Wt 114.8 lb

## 2016-10-05 DIAGNOSIS — G7000 Myasthenia gravis without (acute) exacerbation: Secondary | ICD-10-CM

## 2016-10-05 DIAGNOSIS — R519 Headache, unspecified: Secondary | ICD-10-CM

## 2016-10-05 DIAGNOSIS — R51 Headache: Secondary | ICD-10-CM

## 2016-10-05 DIAGNOSIS — R413 Other amnesia: Secondary | ICD-10-CM | POA: Diagnosis not present

## 2016-10-05 DIAGNOSIS — G2581 Restless legs syndrome: Secondary | ICD-10-CM

## 2016-10-05 MED ORDER — DONEPEZIL HCL 5 MG PO TABS: 5.0000 mg | ORAL_TABLET | Freq: Every day | ORAL | 3 refills | Status: DC

## 2016-10-05 NOTE — Progress Notes (Signed)
I have read the note, and I agree with the clinical assessment and plan.  WILLIS,CHARLES KEITH   
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
## 2016-10-05 NOTE — Patient Instructions (Addendum)
Memory score has declined slightly Restart Aricept 5 mg at bedtime. If tolerating well after 1 month we can increase to 10 mg at bedtime Continue to monitor symptoms for myasthenia gravis.  Call if your headache does not improve with over the counter medications. If your symptoms worsen or you develop new symptoms please let us know.   Donepezil tablets What is this medicine? DONEPEZIL (doe NEP e zil) is used to treat mild to moderate dementia caused by Alzheimer's disease. This medicine may be used for other purposes; ask your health care provider or pharmacist if you have questions. COMMON BRAND NAME(S): Aricept What should I tell my health care provider before I take this medicine? They need to know if you have any of these conditions: -asthma or other lung disease -difficulty passing urine -head injury -heart disease -history of irregular heartbeat -liver disease -seizures (convulsions) -stomach or intestinal disease, ulcers or stomach bleeding -an unusual or allergic reaction to donepezil, other medicines, foods, dyes, or preservatives -pregnant or trying to get pregnant -breast-feeding How should I use this medicine? Take this medicine by mouth with a glass of water. Follow the directions on the prescription label. You may take this medicine with or without food. Take this medicine at regular intervals. This medicine is usually taken before bedtime. Do not take it more often than directed. Continue to take your medicine even if you feel better. Do not stop taking except on your doctor's advice. If you are taking the 23 mg donepezil tablet, swallow it whole; do not cut, crush, or chew it. Talk to your pediatrician regarding the use of this medicine in children. Special care may be needed. Overdosage: If you think you have taken too much of this medicine contact a poison control center or emergency room at once. NOTE: This medicine is only for you. Do not share this medicine with  others. What if I miss a dose? If you miss a dose, take it as soon as you can. If it is almost time for your next dose, take only that dose, do not take double or extra doses. What may interact with this medicine? Do not take this medicine with any of the following medications: -certain medicines for fungal infections like itraconazole, fluconazole, posaconazole, and voriconazole -cisapride -dextromethorphan; quinidine -dofetilide -dronedarone -pimozide -quinidine -thioridazine -ziprasidone This medicine may also interact with the following medications: -antihistamines for allergy, cough and cold -atropine -bethanechol -carbamazepine -certain medicines for bladder problems like oxybutynin, tolterodine -certain medicines for Parkinson's disease like benztropine, trihexyphenidyl -certain medicines for stomach problems like dicyclomine, hyoscyamine -certain medicines for travel sickness like scopolamine -dexamethasone -ipratropium -NSAIDs, medicines for pain and inflammation, like ibuprofen or naproxen -other medicines for Alzheimer's disease -other medicines that prolong the QT interval (cause an abnormal heart rhythm) -phenobarbital -phenytoin -rifampin, rifabutin or rifapentine This list may not describe all possible interactions. Give your health care provider a list of all the medicines, herbs, non-prescription drugs, or dietary supplements you use. Also tell them if you smoke, drink alcohol, or use illegal drugs. Some items may interact with your medicine. What should I watch for while using this medicine? Visit your doctor or health care professional for regular checks on your progress. Check with your doctor or health care professional if your symptoms do not get better or if they get worse. You may get drowsy or dizzy. Do not drive, use machinery, or do anything that needs mental alertness until you know how this drug affects you. What side effects may  I notice from receiving  this medicine? Side effects that you should report to your doctor or health care professional as soon as possible: -allergic reactions like skin rash, itching or hives, swelling of the face, lips, or tongue -feeling faint or lightheaded, falls -loss of bladder control -seizures -signs and symptoms of a dangerous change in heartbeat or heart rhythm like chest pain; dizziness; fast or irregular heartbeat; palpitations; feeling faint or lightheaded, falls; breathing problems -signs and symptoms of infection like fever or chills; cough; sore throat; pain or trouble passing urine -signs and symptoms of liver injury like dark yellow or brown urine; general ill feeling or flu-like symptoms; light-colored stools; loss of appetite; nausea; right upper belly pain; unusually weak or tired; yellowing of the eyes or skin -slow heartbeat or palpitations -unusual bleeding or bruising -vomiting Side effects that usually do not require medical attention (report to your doctor or health care professional if they continue or are bothersome): -diarrhea, especially when starting treatment -headache -loss of appetite -muscle cramps -nausea -stomach upset This list may not describe all possible side effects. Call your doctor for medical advice about side effects. You may report side effects to FDA at 1-800-FDA-1088. Where should I keep my medicine? Keep out of reach of children. Store at room temperature between 15 and 30 degrees C (59 and 86 degrees F). Throw away any unused medicine after the expiration date. NOTE: This sheet is a summary. It may not cover all possible information. If you have questions about this medicine, talk to your doctor, pharmacist, or health care provider.  2018 Elsevier/Gold Standard (2015-12-20 21:00:42)

## 2016-10-23 ENCOUNTER — Other Ambulatory Visit: Payer: Self-pay | Admitting: Surgery

## 2016-10-23 ENCOUNTER — Other Ambulatory Visit: Payer: Self-pay | Admitting: Internal Medicine

## 2016-10-23 DIAGNOSIS — K551 Chronic vascular disorders of intestine: Secondary | ICD-10-CM

## 2016-10-23 NOTE — Telephone Encounter (Signed)
RX faxed to [facility] 

## 2016-10-25 ENCOUNTER — Telehealth: Payer: Self-pay | Admitting: General Practice

## 2016-10-25 NOTE — Telephone Encounter (Signed)
I spoke with the patient and confirmed that Dr. Quay Burow is her PCP since Dr. Linna Darner retired. Duane Lope ([place])

## 2016-11-22 ENCOUNTER — Ambulatory Visit: Payer: PPO | Admitting: Internal Medicine

## 2016-12-06 ENCOUNTER — Ambulatory Visit (INDEPENDENT_AMBULATORY_CARE_PROVIDER_SITE_OTHER): Payer: PPO | Admitting: Internal Medicine

## 2016-12-06 ENCOUNTER — Other Ambulatory Visit (INDEPENDENT_AMBULATORY_CARE_PROVIDER_SITE_OTHER): Payer: PPO

## 2016-12-06 ENCOUNTER — Encounter: Payer: Self-pay | Admitting: Internal Medicine

## 2016-12-06 ENCOUNTER — Other Ambulatory Visit: Payer: Self-pay | Admitting: Internal Medicine

## 2016-12-06 VITALS — BP 146/84 | HR 61 | Temp 97.5°F | Resp 16 | Wt 115.0 lb

## 2016-12-06 DIAGNOSIS — K551 Chronic vascular disorders of intestine: Secondary | ICD-10-CM | POA: Diagnosis not present

## 2016-12-06 DIAGNOSIS — E875 Hyperkalemia: Secondary | ICD-10-CM

## 2016-12-06 DIAGNOSIS — E039 Hypothyroidism, unspecified: Secondary | ICD-10-CM

## 2016-12-06 DIAGNOSIS — G479 Sleep disorder, unspecified: Secondary | ICD-10-CM

## 2016-12-06 DIAGNOSIS — I6521 Occlusion and stenosis of right carotid artery: Secondary | ICD-10-CM

## 2016-12-06 DIAGNOSIS — E782 Mixed hyperlipidemia: Secondary | ICD-10-CM

## 2016-12-06 DIAGNOSIS — I10 Essential (primary) hypertension: Secondary | ICD-10-CM

## 2016-12-06 DIAGNOSIS — R7303 Prediabetes: Secondary | ICD-10-CM

## 2016-12-06 DIAGNOSIS — Z23 Encounter for immunization: Secondary | ICD-10-CM

## 2016-12-06 DIAGNOSIS — R413 Other amnesia: Secondary | ICD-10-CM | POA: Diagnosis not present

## 2016-12-06 DIAGNOSIS — F329 Major depressive disorder, single episode, unspecified: Secondary | ICD-10-CM | POA: Diagnosis not present

## 2016-12-06 DIAGNOSIS — F32A Depression, unspecified: Secondary | ICD-10-CM

## 2016-12-06 LAB — COMPREHENSIVE METABOLIC PANEL
ALT: 13 U/L (ref 0–35)
AST: 24 U/L (ref 0–37)
Albumin: 4.2 g/dL (ref 3.5–5.2)
Alkaline Phosphatase: 65 U/L (ref 39–117)
BUN: 19 mg/dL (ref 6–23)
CO2: 30 mEq/L (ref 19–32)
Calcium: 9.4 mg/dL (ref 8.4–10.5)
Chloride: 103 mEq/L (ref 96–112)
Creatinine, Ser: 0.9 mg/dL (ref 0.40–1.20)
GFR: 63.81 mL/min (ref >60.00)
Glucose, Bld: 99 mg/dL (ref 70–99)
Potassium: 5.6 mEq/L — ABNORMAL HIGH (ref 3.5–5.1)
Sodium: 140 mEq/L (ref 135–145)
Total Bilirubin: 0.4 mg/dL (ref 0.2–1.2)
Total Protein: 6.8 g/dL (ref 6.0–8.3)

## 2016-12-06 LAB — CBC WITH DIFFERENTIAL/PLATELET
Basophils Absolute: 0.1 10*3/uL (ref 0.0–0.1)
Basophils Relative: 0.9 % (ref 0.0–3.0)
Eosinophils Absolute: 0.1 10*3/uL (ref 0.0–0.7)
Eosinophils Relative: 2 % (ref 0.0–5.0)
HCT: 43.1 % (ref 36.0–46.0)
Hemoglobin: 14.1 g/dL (ref 12.0–15.0)
Lymphocytes Relative: 20.1 % (ref 12.0–46.0)
Lymphs Abs: 1.3 10*3/uL (ref 0.7–4.0)
MCHC: 32.7 g/dL (ref 30.0–36.0)
MCV: 89.4 fl (ref 78.0–100.0)
Monocytes Absolute: 0.7 10*3/uL (ref 0.1–1.0)
Monocytes Relative: 10.8 % (ref 3.0–12.0)
Neutro Abs: 4.2 10*3/uL (ref 1.4–7.7)
Neutrophils Relative %: 66.2 % (ref 43.0–77.0)
Platelets: 165 10*3/uL (ref 150.0–400.0)
RBC: 4.83 Mil/uL (ref 3.87–5.11)
RDW: 14.3 % (ref 11.5–15.5)
WBC: 6.4 10*3/uL (ref 4.0–10.5)

## 2016-12-06 LAB — LIPID PANEL
Cholesterol: 214 mg/dL — ABNORMAL HIGH (ref 0–200)
HDL: 92.9 mg/dL (ref >39.00)
LDL Cholesterol: 90 mg/dL (ref 0–99)
NonHDL: 120.98
Total CHOL/HDL Ratio: 2
Triglycerides: 154 mg/dL — ABNORMAL HIGH (ref 0.0–149.0)
VLDL: 30.8 mg/dL (ref 0.0–40.0)

## 2016-12-06 LAB — HEMOGLOBIN A1C: Hgb A1c MFr Bld: 5.9 % (ref 4.6–6.5)

## 2016-12-06 LAB — TSH: TSH: 5.45 u[IU]/mL — ABNORMAL HIGH (ref 0.35–4.50)

## 2016-12-06 MED ORDER — LEVOTHYROXINE SODIUM 125 MCG PO TABS: 125.0000 ug | ORAL_TABLET | Freq: Every day | ORAL | 3 refills | Status: DC

## 2016-12-06 NOTE — Assessment & Plan Note (Signed)
Check a1c Low sugar / carb diet Stressed regular exercise   

## 2016-12-06 NOTE — Assessment & Plan Note (Signed)
BP Readings from Last 3 Encounters:  12/06/16 (!) 146/84  10/05/16 137/77  06/26/16 98/74   Overall controlled Monitor cmp Continue current medications

## 2016-12-06 NOTE — Assessment & Plan Note (Signed)
Controlled, stable Continue current dose of medication  

## 2016-12-06 NOTE — Assessment & Plan Note (Signed)
Following with neurology-Dr. Jannifer Franklin Currently taking Aricept daily

## 2016-12-06 NOTE — Addendum Note (Signed)
Addended by: Terence Lux B on: 12/06/2016 04:29 PM   Modules accepted: Orders

## 2016-12-06 NOTE — Assessment & Plan Note (Addendum)
Taking clonazepam at bedtime Will continue

## 2016-12-06 NOTE — Assessment & Plan Note (Signed)
Check lipid panel  Continue daily statin Regular exercise and healthy diet encouraged  

## 2016-12-06 NOTE — Patient Instructions (Addendum)
  Test(s) ordered today. Your results will be released to [place] (or called to you) after review, usually within 72hours after test completion. If any changes need to be made, you will be notified at that same time.  All other Health Maintenance issues reviewed.   All recommended immunizations and age-appropriate screenings are up-to-date or discussed.  prevnar immunization administered today.   Medications reviewed and updated.  No changes recommended at this time.    Please followup in 6 months with me, Wellness visit with Sharee Pimple

## 2016-12-06 NOTE — Assessment & Plan Note (Addendum)
Following with vascular surgery On plavix

## 2016-12-06 NOTE — Assessment & Plan Note (Signed)
Check tsh  Titrate med dose if needed  

## 2016-12-06 NOTE — Progress Notes (Signed)
Subjective:    Patient ID: Brandi Raymond, female    DOB: 10-06-1933, 83 y.o.   MRN: [id number]  HPI The patient is here for follow up.  Prediabetes:  She is compliant with a low sugar/carbohydrate diet.  She is not exercising regularly.  Hypertension: She is taking her medication daily. She is compliant with a low sodium diet.  She denies chest pain, palpitations, edema  and regular headaches. She is not exercising regularly.    Hyperlipidemia: She is taking her medication daily. She is compliant with a low fat/cholesterol diet. She is not exercising regularly.     Hypothyroidism:  She is taking her medication daily.  She denies any recent changes in energy or weight that are unexplained.   Depression: She is taking her medication daily as prescribed. She denies any side effects from the medication. She feels her depression is well controlled and she is happy with her current dose of medication.   Sleep difficulties:  She takes the clonazepam nightly.  She denies side effects and the medication works well.   Never takes tramadol.    Medications and allergies reviewed with patient and updated if appropriate.  Patient Active Problem List   Diagnosis Date Noted  . Prediabetes 06/23/2015  . Depression 06/23/2015  . Carotid stenosis 10/12/2014  . Lung nodule 07/07/2014  . Sleep disorder 04/09/2014  . Mesenteric artery stenosis ([place]) 01/13/2013  . Symptomatic bradycardia 12/03/2012  . Thoracic aneurysm without mention of rupture 11/04/2012  . Chronic mesenteric ischemia ([place]) 10/08/2012  . Memory deficit 06/20/2012  . Irritable bowel syndrome 06/20/2012  . 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 PVD (peripheral vascular disease) ([place]) 06/20/2012  . Hereditary and idiopathic peripheral neuropathy 05/22/2012  . Syncope 08/28/2011  . EXOPHTHALMOS 10/18/2009  . ABDOMINAL BRUIT 08/17/2009  . Coronary atherosclerosis 09/05/2008  . Hypothyroidism 05/26/2008  . VITAMIN D  DEFICIENCY 05/26/2008  . Osteopenia 05/12/2008  . CHOLELITHIASIS 12/06/2007  . DIVERTICULOSIS, COLON 12/05/2007  . HYPERLIPIDEMIA 08/19/2007  . OTHER EMPHYSEMA 08/19/2007  . CIGARETTE SMOKER 02/06/2007  . Essential hypertension 02/06/2007  . COLONIC POLYPS 11/21/2004    Current Outpatient Prescriptions on File Prior to Visit  Medication Sig Dispense Refill  . aspirin 81 MG tablet Take 81 mg by mouth daily.      . Calcium Carb-Cholecalciferol (CALCIUM 500 +D) 500-400 MG-UNIT TABS One tablet twice daily 60 tablet   . clonazePAM (KLONOPIN) 1 MG tablet TAKE 1/2 TABLETS EVERYDAY AT BEDTIME 15 tablet 2  . clopidogrel (PLAVIX) 75 MG tablet TAKE 1 TABLET BY MOUTH DAILY 90 tablet 3  . donepezil (ARICEPT) 5 MG tablet Take 1 tablet (5 mg total) by mouth at bedtime. 90 tablet 3  . gabapentin (NEURONTIN) 300 MG capsule TAKE ONE CAPSULE TWICE DAILY AND 2 AT NIGHT = FOUR TOTAL DAILY. 360 capsule 2  . levothyroxine (SYNTHROID, LEVOTHROID) 112 MCG tablet Take 1 tablet (112 mcg total) by mouth daily before breakfast. 90 tablet 3  . lisinopril (PRINIVIL,ZESTRIL) 5 MG tablet Take 1 tablet (5 mg total) by mouth 2 (two) times daily. 180 tablet 3  . Polyethyl Glycol-Propyl Glycol (SYSTANE) 0.4-0.3 % GEL Place 1 drop into both eyes daily as needed (for dry eyes).     . pravastatin (PRAVACHOL) 40 MG tablet Take 1 tablet (40 mg total) by mouth daily. 90 tablet 3  . sertraline (ZOLOFT) 100 MG tablet Take 1 tablet (100 mg total) by mouth daily. 90 tablet 3  . traMADol (ULTRAM) 50 MG  tablet Take 1 tablet (50 mg total) by mouth every 8 (eight) hours as needed. 30 tablet 0   No current facility-administered medications on file prior to visit.     Past Medical History:  Diagnosis Date  . Acute MI inferior subsequent episode care [facility]) 1993   PTCA RCA  . Adenomatous colon polyp   . Arthritis   . CAD (coronary artery disease)    Dr Percival Spanish  . Carotid artery occlusion   . Cervical spine fracture ([place])   . COPD  (chronic obstructive pulmonary disease) ([place])   . Depression   . Diverticulosis   . Dyslipidemia   . Gastritis 09/15/1991  . GERD (gastroesophageal reflux disease) 09/15/1991   Dr Sharlett Iles  . Hiatal hernia 09/15/1991  . Hip fracture, right ([place])   . HLD (hyperlipidemia)   . HTN (hypertension)   . Hyperplastic polyps of stomach 11/2007   colonoscopy  . Hypertension   . Hypothyroidism    affecting the left eye, proptosis  . Iron deficiency anemia   . Macular degeneration of left eye   . Memory loss   . Mesenteric artery stenosis ([place])   . Myasthenia gravis ([place])    With ocular features  . Myocardial infarction 1993  . Ocular myasthenia gravis ([place])    Dr Jannifer Franklin  . Orthostatic hypotension 06/05/2013  . Pneumonia    hx  . PONV (postoperative nausea and vomiting)   . Shortness of breath dyspnea    occ  . Strabismus    left eye  . Syncope 1998    Past Surgical History:  Procedure Laterality Date  . ABDOMINAL AORTAGRAM N/A 10/15/2012   Procedure: ABDOMINAL Maxcine Ham;  Surgeon: Serafina Mitchell, MD;  Location: [facility] CATH LAB;  Service: Cardiovascular;  Laterality: N/A;  . arm surgery Left    fx  . BALLOON ANGIOPLASTY, ARTERY  1993  . CARDIAC CATHETERIZATION  1996   LAD 20/50, CFX OK, RCA 30 at prev PTCA site, EF with mild HK inferior wall  . CAROTID ANGIOGRAM N/A 10/28/2014   Procedure: CAROTID ANGIOGRAM;  Surgeon: Serafina Mitchell, MD;  Location: [facility] CATH LAB;  Service: Cardiovascular;  Laterality: N/A;  . CAROTID ENDARTERECTOMY    . CATARACT EXTRACTION     bilateral  . COLONOSCOPY W/ POLYPECTOMY  2006   Adenomatous polyps  . ENDARTERECTOMY Left 01/14/2015   Procedure: LEFT CAROTID ENDARTERECTOMY ;  Surgeon: Serafina Mitchell, MD;  Location: [place];  Service: Vascular;  Laterality: Left;  . ENDARTERECTOMY Right 08/12/2015   Procedure: ENDARTERECTOMY CAROTID WITH PATCH ANGIOPLASTY;  Surgeon: Serafina Mitchell, MD;  Location: [place];  Service: Vascular;  Laterality: Right;  . EYE MUSCLE  SURGERY Left 11/04/2015  . EYE SURGERY Bilateral May 2016   Eyelids  . FOOT SURGERY Left   . LEG SURGERY Left    laceration  . MIDDLE EAR SURGERY Left 1970  . PERCUTANEOUS STENT INTERVENTION  12/03/2012   Procedure: PERCUTANEOUS STENT INTERVENTION;  Surgeon: Serafina Mitchell, MD;  Location: [facility] CATH LAB;  Service: Cardiovascular;;  sma stent x1  . STRABISMUS SURGERY Left 10/28/2015   Procedure: REPAIR STRABISMUS LEFT EYE;  Surgeon: Lamonte Sakai, MD;  Location: [place];  Service: Ophthalmology;  Laterality: Left;  . Third-degree [place]  2003   [facility]-legs ,buttocks,arms  . TOTAL ABDOMINAL HYSTERECTOMY  1973   Dysfunctional menses  . UPPER GI ENDOSCOPY      Dr Sharlett Iles  . VISCERAL ANGIOGRAM N/A 10/15/2012   Procedure: VISCERAL ANGIOGRAM;  Surgeon: Serafina Mitchell, MD;  Location: [facility] CATH LAB;  Service: Cardiovascular;  Laterality: N/A;  . VISCERAL ANGIOGRAM N/A 12/03/2012   Procedure: VISCERAL ANGIOGRAM;  Surgeon: Serafina Mitchell, MD;  Location: [facility] CATH LAB;  Service: Cardiovascular;  Laterality: N/A;  . VISCERAL ANGIOGRAM N/A 08/05/2013   Procedure: MESENTERIC ANGIOGRAM;  Surgeon: Serafina Mitchell, MD;  Location: [facility] CATH LAB;  Service: Cardiovascular;  Laterality: N/A;  . VISCERAL ANGIOGRAM N/A 10/28/2014   Procedure: VISCERAL ANGIOGRAM;  Surgeon: Serafina Mitchell, MD;  Location: [facility] CATH LAB;  Service: Cardiovascular;  Laterality: N/A;    Social History   Social History  . Marital status: Married    Spouse name: N/A  . Number of children: 3  . Years of education: 9th   Occupational History  . Retired    Social History Main Topics  . Smoking status: Light Tobacco Smoker    Packs/day: 0.25    Years: 60.00    Types: Cigarettes  . Smokeless tobacco: Never Used     Comment: now 3 cigarettes/ day  . Alcohol use No  . Drug use: No  . Sexual activity: No   Other Topics Concern  . Not on file   Social History Narrative   Patient is right handed.   Patient drinks 3-4 cups of  caffeine daily.    Family History  Problem Relation Age of Onset  . Throat cancer Mother        ? thyroid cancer  . Cancer Mother   . Emphysema Father   . Diabetes Father   . Heart attack Father 42  . Colon cancer Brother   . Cerebral aneurysm Brother   . Hypothyroidism Sister        X13  . Cancer Brother        Ear  . Diabetes Paternal Grandmother   . Diabetes Paternal Grandfather   . Diabetes Maternal Aunt     Review of Systems  Constitutional: Negative for appetite change, chills, fatigue, fever and unexpected weight change.  Respiratory: Positive for cough (from smoking). Negative for shortness of breath and wheezing.   Cardiovascular: Negative for chest pain, palpitations and leg swelling.  Gastrointestinal: Positive for abdominal pain (chronic - mesenteric ischemia). Negative for nausea.       No gerd  Neurological: Positive for headaches. Negative for dizziness and light-headedness.       Objective:   Vitals:   12/06/16 0934  BP: (!) 146/84  Pulse: 61  Resp: 16  Temp: 97.5 F (36.4 C)   Wt Readings from Last 3 Encounters:  12/06/16 115 lb (52.2 kg)  10/05/16 114 lb 12.8 oz (52.1 kg)  06/26/16 109 lb (49.4 kg)   Body mass index is 19.14 kg/m.   Physical Exam    Constitutional: Appears well-developed and well-nourished. No distress.  HENT:  Head: Normocephalic and atraumatic.  Neck: Neck supple. No tracheal deviation present. No thyromegaly present.  No cervical lymphadenopathy Cardiovascular: Normal rate, regular rhythm and normal heart sounds.   No murmur heard. No carotid bruit .  No edema Pulmonary/Chest: Effort normal.  Poor air entry diffusely from smoking.  No respiratory distress. No has no wheezes. No rales.  Skin: Skin is warm and dry. Not diaphoretic.  Psychiatric: Normal mood and affect. Behavior is normal.      Assessment & Plan:    See Problem List for Assessment and Plan of chronic medical problems.

## 2016-12-08 ENCOUNTER — Other Ambulatory Visit (INDEPENDENT_AMBULATORY_CARE_PROVIDER_SITE_OTHER): Payer: PPO

## 2016-12-08 DIAGNOSIS — H1851 Endothelial corneal dystrophy: Secondary | ICD-10-CM | POA: Diagnosis not present

## 2016-12-08 DIAGNOSIS — H01024 Squamous blepharitis left upper eyelid: Secondary | ICD-10-CM | POA: Diagnosis not present

## 2016-12-08 DIAGNOSIS — E875 Hyperkalemia: Secondary | ICD-10-CM | POA: Diagnosis not present

## 2016-12-08 DIAGNOSIS — H02534 Eyelid retraction left upper eyelid: Secondary | ICD-10-CM | POA: Diagnosis not present

## 2016-12-08 DIAGNOSIS — H01021 Squamous blepharitis right upper eyelid: Secondary | ICD-10-CM | POA: Diagnosis not present

## 2016-12-08 DIAGNOSIS — H02831 Dermatochalasis of right upper eyelid: Secondary | ICD-10-CM | POA: Diagnosis not present

## 2016-12-08 DIAGNOSIS — H532 Diplopia: Secondary | ICD-10-CM | POA: Diagnosis not present

## 2016-12-08 DIAGNOSIS — E039 Hypothyroidism, unspecified: Secondary | ICD-10-CM

## 2016-12-08 DIAGNOSIS — H43813 Vitreous degeneration, bilateral: Secondary | ICD-10-CM | POA: Diagnosis not present

## 2016-12-08 DIAGNOSIS — H04123 Dry eye syndrome of bilateral lacrimal glands: Secondary | ICD-10-CM | POA: Diagnosis not present

## 2016-12-08 DIAGNOSIS — H01025 Squamous blepharitis left lower eyelid: Secondary | ICD-10-CM | POA: Diagnosis not present

## 2016-12-08 DIAGNOSIS — H02834 Dermatochalasis of left upper eyelid: Secondary | ICD-10-CM | POA: Diagnosis not present

## 2016-12-08 DIAGNOSIS — H353131 Nonexudative age-related macular degeneration, bilateral, early dry stage: Secondary | ICD-10-CM | POA: Diagnosis not present

## 2016-12-08 DIAGNOSIS — H01022 Squamous blepharitis right lower eyelid: Secondary | ICD-10-CM | POA: Diagnosis not present

## 2016-12-08 LAB — BASIC METABOLIC PANEL
BUN: 19 mg/dL (ref 6–23)
CO2: 31 mEq/L (ref 19–32)
Calcium: 9.1 mg/dL (ref 8.4–10.5)
Chloride: 103 mEq/L (ref 96–112)
Creatinine, Ser: 0.82 mg/dL (ref 0.40–1.20)
GFR: 71.05 mL/min (ref >60.00)
Glucose, Bld: 96 mg/dL (ref 70–99)
Potassium: 5.1 mEq/L (ref 3.5–5.1)
Sodium: 139 mEq/L (ref 135–145)

## 2016-12-08 LAB — TSH: TSH: 4.46 u[IU]/mL (ref 0.35–4.50)

## 2017-01-01 ENCOUNTER — Encounter (HOSPITAL_COMMUNITY): Payer: PPO

## 2017-01-01 ENCOUNTER — Ambulatory Visit: Payer: PPO | Admitting: Surgery

## 2017-01-15 ENCOUNTER — Encounter: Payer: Self-pay | Admitting: Family

## 2017-01-22 ENCOUNTER — Ambulatory Visit (HOSPITAL_COMMUNITY)
Admission: RE | Admit: 2017-01-22 | Discharge: 2017-01-22 | Disposition: A | Discharge status: Home / Self Care | Payer: PPO | Source: Ambulatory Visit | Attending: Surgery | Admitting: Surgery

## 2017-01-22 ENCOUNTER — Encounter: Payer: Self-pay | Admitting: Family

## 2017-01-22 ENCOUNTER — Ambulatory Visit (INDEPENDENT_AMBULATORY_CARE_PROVIDER_SITE_OTHER)
Admission: RE | Admit: 2017-01-22 | Discharge: 2017-01-22 | Disposition: A | Discharge status: Home / Self Care | Payer: PPO | Source: Ambulatory Visit | Attending: Surgery | Admitting: Surgery

## 2017-01-22 ENCOUNTER — Ambulatory Visit (INDEPENDENT_AMBULATORY_CARE_PROVIDER_SITE_OTHER): Payer: PPO | Admitting: Family

## 2017-01-22 ENCOUNTER — Ambulatory Visit: Payer: PPO | Admitting: Surgery

## 2017-01-22 VITALS — BP 145/73 | HR 67 | Temp 97.5°F | Resp 16 | Ht 65.0 in | Wt 116.0 lb

## 2017-01-22 DIAGNOSIS — K551 Chronic vascular disorders of intestine: Secondary | ICD-10-CM | POA: Diagnosis not present

## 2017-01-22 DIAGNOSIS — I6523 Occlusion and stenosis of bilateral carotid arteries: Secondary | ICD-10-CM | POA: Diagnosis not present

## 2017-01-22 DIAGNOSIS — Z9889 Other specified postprocedural states: Secondary | ICD-10-CM

## 2017-01-22 DIAGNOSIS — Z95828 Presence of other vascular implants and grafts: Secondary | ICD-10-CM | POA: Insufficient documentation

## 2017-01-22 LAB — VAS US CAROTID
LEFT ECA DIAS: -16 cm/s
Left CCA dist dias: 35 cm/s
Left CCA dist sys: 124 cm/s
Left CCA prox dias: 31 cm/s
Left CCA prox sys: 133 cm/s
Left ICA dist dias: -67 cm/s
Left ICA dist sys: -201 cm/s
Left ICA prox dias: -34 cm/s
Left ICA prox sys: -112 cm/s
RIGHT CCA MID DIAS: 32 cm/s
RIGHT ECA DIAS: -13 cm/s
Right CCA prox dias: -22 cm/s
Right CCA prox sys: -123 cm/s
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
## 2017-01-22 NOTE — Patient Instructions (Addendum)
Before your next abdominal ultrasound:  Take two Extra-Strength Gas-X capsules at bedtime the night before the test. Take another two Extra-Strength Gas-X capsules 3 hours before the test.  Avoid gas forming foods the day before the test.      Steps to Quit Smoking Smoking tobacco can be bad for your health. It can also affect almost every organ in your body. Smoking puts you and people around you at risk for many serious long-lasting (chronic) diseases. Quitting smoking is hard, but it is one of the best things that you can do for your health. It is never too late to quit. What are the benefits of quitting smoking? When you quit smoking, you lower your risk for getting serious diseases and conditions. They can include:  Lung cancer or lung disease.  Heart disease.  Stroke.  Heart attack.  Not being able to have children (infertility).  Weak bones (osteoporosis) and broken bones (fractures).  If you have coughing, wheezing, and shortness of breath, those symptoms may get better when you quit. You may also get sick less often. If you are pregnant, quitting smoking can help to lower your chances of having a baby of low birth weight. What can I do to help me quit smoking? Talk with your doctor about what can help you quit smoking. Some things you can do (strategies) include:  Quitting smoking totally, instead of slowly cutting back how much you smoke over a period of time.  Going to in-person counseling. You are more likely to quit if you go to many counseling sessions.  Using resources and support systems, such as: ? Database administrator with a Social worker. ? Phone quitlines. ? Careers information officer. ? Support groups or group counseling. ? Text messaging programs. ? Mobile phone apps or applications.  Taking medicines. Some of these medicines may have nicotine in them. If you are pregnant or breastfeeding, do not take any medicines to quit smoking unless your doctor says it is  okay. Talk with your doctor about counseling or other things that can help you.  Talk with your doctor about using more than one strategy at the same time, such as taking medicines while you are also going to in-person counseling. This can help make quitting easier. What things can I do to make it easier to quit? Quitting smoking might feel very hard at first, but there is a lot that you can do to make it easier. Take these steps:  Talk to your family and friends. Ask them to support and encourage you.  Call phone quitlines, reach out to support groups, or work with a Social worker.  Ask people who smoke to not smoke around you.  Avoid places that make you want (trigger) to smoke, such as: ? Bars. ? Parties. ? Smoke-break areas at work.  Spend time with people who do not smoke.  Lower the stress in your life. Stress can make you want to smoke. Try these things to help your stress: ? Getting regular exercise. ? Deep-breathing exercises. ? Yoga. ? Meditating. ? Doing a body scan. To do this, close your eyes, focus on one area of your body at a time from head to toe, and notice which parts of your body are tense. Try to relax the muscles in those areas.  Download or buy apps on your mobile phone or tablet that can help you stick to your quit plan. There are many free apps, such as QuitGuide from the [employer] Office manager for Disease Control and Prevention).  You can find more support from smokefree.gov and other websites.  This information is not intended to replace advice given to you by your health care provider. Make sure you discuss any questions you have with your health care provider. Document Released: 04/29/2009 Document Revised: 02/29/2016 Document Reviewed: 11/17/2014 Elsevier Interactive Patient Education  2018 [employer].     Stroke Prevention Some medical conditions and behaviors are associated with an increased chance of having a stroke. You may prevent a stroke by making healthy  choices and managing medical conditions. How can I reduce my risk of having a stroke?  Stay physically active. Get at least 30 minutes of activity on most or all days.  Do not smoke. It may also be helpful to avoid exposure to secondhand smoke.  Limit alcohol use. Moderate alcohol use is considered to be: ? No more than 2 drinks per day for men. ? No more than 1 drink per day for nonpregnant women.  Eat healthy foods. This involves: ? Eating 5 or more servings of fruits and vegetables a day. ? Making dietary changes that address high blood pressure (hypertension), high cholesterol, diabetes, or obesity.  Manage your cholesterol levels. ? Making food choices that are high in fiber and low in saturated fat, trans fat, and cholesterol may control cholesterol levels. ? Take any prescribed medicines to control cholesterol as directed by your health care provider.  Manage your diabetes. ? Controlling your carbohydrate and sugar intake is recommended to manage diabetes. ? Take any prescribed medicines to control diabetes as directed by your health care provider.  Control your hypertension. ? Making food choices that are low in salt (sodium), saturated fat, trans fat, and cholesterol is recommended to manage hypertension. ? Ask your health care provider if you need treatment to lower your blood pressure. Take any prescribed medicines to control hypertension as directed by your health care provider. ? If you are 69-12 years of age, have your blood pressure checked every 3-5 years. If you are 35 years of age or older, have your blood pressure checked every year.  Maintain a healthy weight. ? Reducing calorie intake and making food choices that are low in sodium, saturated fat, trans fat, and cholesterol are recommended to manage weight.  Stop drug abuse.  Avoid taking birth control pills. ? Talk to your health care provider about the risks of taking birth control pills if you are over 50  years old, smoke, get migraines, or have ever had a blood clot.  Get evaluated for sleep disorders (sleep apnea). ? Talk to your health care provider about getting a sleep evaluation if you snore a lot or have excessive sleepiness.  Take medicines only as directed by your health care provider. ? For some people, aspirin or blood thinners (anticoagulants) are helpful in reducing the risk of forming abnormal blood clots that can lead to stroke. If you have the irregular heart rhythm of atrial fibrillation, you should be on a blood thinner unless there is a good reason you cannot take them. ? Understand all your medicine instructions.  Make sure that other conditions (such as anemia or atherosclerosis) are addressed. Get help right away if:  You have sudden weakness or numbness of the face, arm, or leg, especially on one side of the body.  Your face or eyelid droops to one side.  You have sudden confusion.  You have trouble speaking (aphasia) or understanding.  You have sudden trouble seeing in one or both eyes.  You  have sudden trouble walking.  You have dizziness.  You have a loss of balance or coordination.  You have a sudden, severe headache with no known cause.  You have new chest pain or an irregular heartbeat. Any of these symptoms may represent a serious problem that is an emergency. Do not wait to see if the symptoms will go away. Get medical help at once. Call your local emergency services (911 in [place] Do not drive yourself to the hospital. This information is not intended to replace advice given to you by your health care provider. Make sure you discuss any questions you have with your health care provider. Document Released: 08/10/2004 Document Revised: 12/09/2015 Document Reviewed: 01/03/2013 Elsevier Interactive Patient Education  2017 [place].     Preventing Cerebrovascular Disease Arteries are blood vessels that carry blood that contains oxygen from the  heart to all parts of the body. Cerebrovascular disease affects arteries that supply the brain. Any condition that blocks or disrupts blood flow to the brain can cause cerebrovascular disease. Brain cells that lose blood supply start to die within minutes (stroke). Stroke is the main danger of cerebrovascular disease. Atherosclerosis and high blood pressure are common causes of cerebrovascular disease. Atherosclerosis is narrowing and hardening of an artery that results when fat, cholesterol, calcium, or other substances (plaque) build up inside an artery. Plaque reduces blood flow through the artery. High blood pressure increases the risk of bleeding inside the brain. Making diet and lifestyle changes to prevent atherosclerosis and high blood pressure lowers your risk of cerebrovascular disease. What nutrition changes can be made?  Eat more fruits, vegetables, and whole grains.  Reduce how much saturated fat you eat. To do this, eat less red meat and fewer full-fat dairy products.  Eat healthy proteins instead of red meat. Healthy proteins include: ? Fish. Eat fish that contains heart-healthy omega-3 fatty acids, twice a week. Examples include salmon, albacore tuna, mackerel, and herring. ? Chicken. ? Nuts. ? Low-fat or nonfat yogurt.  Avoid processed meats, like bacon and lunchmeat.  Avoid foods that contain: ? A lot of sugar, such as sweets and drinks with added sugar. ? A lot of salt (sodium). Avoid adding extra salt to your food, as told by your health care provider. ? Trans fats, such as margarine and baked goods. Trans fats may be listed as "partially hydrogenated oils" on food labels.  Check food labels to see how much sodium, sugar, and trans fats are in foods.  Use vegetable oils that contain low amounts of saturated fat, such as olive oil or canola oil. What lifestyle changes can be made?  Drink alcohol in moderation. This means no more than 1 drink a day for nonpregnant women  and 2 drinks a day for men. One drink equals 12 oz of beer, 5 oz of wine, or 1 oz of hard liquor.  If you are overweight, ask your health care provider to recommend a weight-loss plan for you. Losing 5-10 lb (2.2-4.5 kg) can reduce your risk of diabetes, atherosclerosis, and high blood pressure.  Exercise for 30?60 minutes on most days, or as much as told by your health care provider. ? Do moderate-intensity exercise, such as brisk walking, bicycling, and water aerobics. Ask your health care provider which activities are safe for you.  Do not use any products that contain nicotine or tobacco, such as cigarettes and e-cigarettes. If you need help quitting, ask your health care provider. Why are these changes important? Making these changes  lowers your risk of many diseases that can cause cerebrovascular disease and stroke. Stroke is a leading cause of death and disability. Making these changes also improves your overall health and quality of life. What can I do to lower my risk? The following factors make you more likely to develop cerebrovascular disease:  Being overweight.  Smoking.  Being physically inactive.  Eating a high-fat diet.  Having certain health conditions, such as: ? Diabetes. ? High blood pressure. ? Heart disease. ? Atherosclerosis. ? High cholesterol. ? Sickle cell disease.  Talk with your health care provider about your risk for cerebrovascular disease. Work with your health care provider to control diseases that you have that may contribute to cerebrovascular disease. Your health care provider may prescribe medicines to help prevent major causes of cerebrovascular disease. Where to find more information: Learn more about preventing cerebrovascular disease from:  [place], Lung, and [place]: MoAnalyst.de  Centers for Disease Control and Prevention: http://www.curry-wood.biz/  Summary  Cerebrovascular disease  can lead to a stroke.  Atherosclerosis and high blood pressure are major causes of cerebrovascular disease.  Making diet and lifestyle changes can reduce your risk of cerebrovascular disease.  Work with your health care provider to get your risk factors under control to reduce your risk of cerebrovascular disease. This information is not intended to replace advice given to you by your health care provider. Make sure you discuss any questions you have with your health care provider. Document Released: 07/18/2015 Document Revised: 01/21/2016 Document Reviewed: 07/18/2015 Elsevier Interactive Patient Education  2018 [employer].      Chronic Mesenteric Ischemia Mesenteric ischemia is poor blood flow (circulation) in the vessels that supply blood to the stomach, intestines, and liver (mesenteric organs). Chronic mesenteric ischemia, also called mesenteric angina or intestinal angina, is a long-term (chronic) condition. It happens when an artery or vein that provides blood to the mesenteric organs gradually becomes blocked or narrow, restricting the blood supply to the organs. When the blood supply is severely restricted, the mesenteric organs cannot work properly. What are the causes? This condition is commonly caused by fatty deposits that build up in an artery (plaque), which can narrow the artery and restrict blood flow. Other causes include:  Weakened areas in blood vessel walls (aneurysms).  Conditions that cause twisting or inflammation of blood vessels, such as fibromuscular dysplasia or arteritis.  A disorder in which blood clots form in the veins (venous thrombosis).  Scarring and thickening (fibrosis) of blood vessels caused by radiation therapy.  A tear in the aorta, the body's main artery (aortic dissection).  Blood vessel problems after illegal drug use, such as use of cocaine.  Tumors in the nervous system (neurofibromatosis).  Certain autoimmune diseases, such as  lupus.  What increases the risk? The following factors may make you more likely to develop this condition:  Being female.  Being over age 6, especially if you have a history of heart problems.  Smoking.  Congestive heart failure.  Irregular heartbeat (arrhythmia).  Having a history of heart attack or stroke.  Diabetes.  High cholesterol.  High blood pressure (hypertension).  Being overweight or obese.  Kidney disease (renal disease) requiring dialysis.  What are the signs or symptoms? Symptoms of this condition include:  Abdomen (abdominal) pain or cramps that develop 15-60 minutes after a meal. This pain may last for 1-3 hours. Some people may develop a fear of eating because of this symptom.  Weight loss.  Diarrhea.  Bloody stool.  Nausea.  Vomiting.  Bloating.  Abdominal pain after stress or with exercise.  How is this diagnosed? This condition is diagnosed based on:  Your medical history.  A physical exam.  Tests, such as: ? Ultrasound. ? CT scan. ? Blood tests. ? Urine tests. ? An imaging test that involves injecting a dye into your arteries to show blood flow through blood vessels (angiogram). This can help to show if there are any blockages in the vessels that lead to the intestines. ? Passing a small probe through the mouth and into the stomach to measure the output of carbon dioxide (gastric tonometry). This can help to indicate whether there is decreased blood flow to the stomach and intestines.  How is this treated? This condition may be treated with:  Dietary changes such as eating smaller, low-fat, meals more frequently.  Lifestyle changes to treat underlying conditions that contribute to the disease, such as high cholesterol and high blood pressure.  Medicines to reduce blood clotting and increase blood flow.  Surgery to remove the blockage, repair arteries or veins, and restore blood flow. This may involve: ? Angioplasty. This is  surgery to widen the affected artery, reduce the blockage, and sometimes insert a small, mesh tube (stent). ? Bypass surgery. This may be done to go around (bypass) the blockage and reconnect healthy arteries or veins. ? Placing a stent in the affected area. This may be done to help keep blocked arteries open.  Follow these instructions at home: Eating and drinking  Eat a heart-healthy diet. This includes fresh fruits and vegetables, whole grains, and lean proteins like chicken, fish, eggs, and beans.  Avoid foods that contain a lot of: ? Salt (sodium). ? Sugar. ? Saturated fat (such as red meat). ? Trans fat (such as fried foods).  Stay hydrated. Drink enough fluid to keep your urine clear or pale yellow. Lifestyle  Stay active and get regular exercise as told by your health care provider. Aim for 150 minutes of moderate activity or 75 minutes of vigorous activity a week. Ask your health care provider what activities and forms of exercise are safe for you.  Maintain a healthy weight.  Work with your health care provider to manage your cholesterol.  Manage any other health problems you have, such as high blood pressure, diabetes, or heart rhythm problems.  Do not use any products that contain nicotine or tobacco, such as cigarettes and e-cigarettes. If you need help quitting, ask your health care provider. General instructions  Take over-the-counter and prescription medicines only as told by your health care provider.  Keep all follow-up visits as told by your health care provider. This is important. Contact a health care provider if:  Your symptoms do not improve or they return after treatment.  You have a fever. Get help right away if:  You have severe abdominal pain.  You have severe chest pain.  You have shortness of breath.  You feel weak or dizzy.  You have palpitations.  You have numbness or weakness in your face, arm, or leg.  You are confused.  You have  trouble speaking or people have trouble understanding what you are saying.  You are constipated.  You have trouble urinating.  You have blood in your stool.  You have severe nausea, vomiting, or persistent diarrhea. Summary  Mesenteric ischemia is poor circulation in the vessels that supply blood to the the stomach, intestines, and liver (mesenteric organs).  This condition happens when an artery or vein  that provides blood to the mesenteric organs gradually becomes blocked or narrow, restricting the blood supply to the organs.  This condition is commonly caused by fatty deposits that build up in an artery (plaque), which can narrow the artery and restrict blood flow.  You are more likely to develop this condition if you are over age 69 and have a history of heart problems, high blood pressure, diabetes, or high cholesterol.  This condition is usually treated with medicines, dietary and lifestyle changes, and surgery to remove the blockage, repair arteries or veins, and restore blood flow. This information is not intended to replace advice given to you by your health care provider. Make sure you discuss any questions you have with your health care provider. Document Released: 02/20/2011 Document Revised: 06/17/2016 Document Reviewed: 06/17/2016 Elsevier Interactive Patient Education  2017 [employer].

## 2017-01-23 ENCOUNTER — Other Ambulatory Visit: Payer: Self-pay | Admitting: Internal Medicine

## 2017-01-23 NOTE — Telephone Encounter (Signed)
[place] controlled substance database checked.  Ok to fill medication.  

## 2017-01-24 NOTE — Addendum Note (Signed)
Addended by: Lianne Cure A on: 01/24/2017 01:30 PM   Modules accepted: Orders

## 2017-03-07 ENCOUNTER — Ambulatory Visit: Payer: PPO

## 2017-04-12 ENCOUNTER — Ambulatory Visit: Payer: PPO | Admitting: Neurology

## 2017-04-12 ENCOUNTER — Telehealth: Payer: Self-pay | Admitting: Neurology

## 2017-04-12 NOTE — Telephone Encounter (Signed)
This patient did not show for a revisit appointment today. 

## 2017-04-13 ENCOUNTER — Encounter: Payer: Self-pay | Admitting: Neurology

## 2017-05-07 ENCOUNTER — Encounter (HOSPITAL_COMMUNITY): Payer: PPO

## 2017-05-07 ENCOUNTER — Ambulatory Visit: Payer: PPO | Admitting: Surgery

## 2017-05-11 ENCOUNTER — Other Ambulatory Visit: Payer: Self-pay | Admitting: Neurology

## 2017-05-11 ENCOUNTER — Other Ambulatory Visit: Payer: Self-pay | Admitting: Internal Medicine

## 2017-05-11 NOTE — Telephone Encounter (Signed)
[place] Controlled Substance Database checked. Last filled on 03/30/17

## 2017-05-11 NOTE — Telephone Encounter (Signed)
RX faxed to [facility] 

## 2017-05-17 ENCOUNTER — Other Ambulatory Visit: Payer: Self-pay | Admitting: Internal Medicine

## 2017-05-26 ENCOUNTER — Other Ambulatory Visit: Payer: Self-pay | Admitting: Internal Medicine

## 2017-05-28 ENCOUNTER — Ambulatory Visit: Payer: PPO | Admitting: Surgery

## 2017-05-28 ENCOUNTER — Encounter (HOSPITAL_COMMUNITY): Payer: PPO

## 2017-06-04 ENCOUNTER — Encounter (HOSPITAL_COMMUNITY): Payer: PPO

## 2017-06-04 ENCOUNTER — Ambulatory Visit: Payer: PPO | Admitting: Surgery

## 2017-06-10 NOTE — Patient Instructions (Addendum)
  Test(s) ordered today. Your results will be released to [facility] (or called to you) after review, usually within 72hours after test completion. If any changes need to be made, you will be notified at that same time.  All other Health Maintenance issues reviewed.   All recommended immunizations and age-appropriate screenings are up-to-date or discussed.  Flu immunization administered today.   Medications reviewed and updated.  No changes recommended at this time.  Your prescription(s) have been submitted to your pharmacy. Please take as directed and contact our office if you believe you are having problem(s) with the medication(s).   Please followup in 6 months
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
## 2017-06-11 ENCOUNTER — Ambulatory Visit: Payer: PPO | Admitting: Internal Medicine

## 2017-06-11 ENCOUNTER — Encounter: Payer: Self-pay | Admitting: Internal Medicine

## 2017-06-11 ENCOUNTER — Other Ambulatory Visit (INDEPENDENT_AMBULATORY_CARE_PROVIDER_SITE_OTHER): Payer: PPO

## 2017-06-11 VITALS — BP 124/84 | HR 83 | Temp 98.5°F | Resp 16 | Wt 116.0 lb

## 2017-06-11 DIAGNOSIS — G479 Sleep disorder, unspecified: Secondary | ICD-10-CM | POA: Diagnosis not present

## 2017-06-11 DIAGNOSIS — R7303 Prediabetes: Secondary | ICD-10-CM

## 2017-06-11 DIAGNOSIS — E039 Hypothyroidism, unspecified: Secondary | ICD-10-CM | POA: Diagnosis not present

## 2017-06-11 DIAGNOSIS — I10 Essential (primary) hypertension: Secondary | ICD-10-CM

## 2017-06-11 DIAGNOSIS — Z23 Encounter for immunization: Secondary | ICD-10-CM

## 2017-06-11 DIAGNOSIS — F329 Major depressive disorder, single episode, unspecified: Secondary | ICD-10-CM | POA: Diagnosis not present

## 2017-06-11 DIAGNOSIS — Z13820 Encounter for screening for osteoporosis: Secondary | ICD-10-CM

## 2017-06-11 DIAGNOSIS — E782 Mixed hyperlipidemia: Secondary | ICD-10-CM | POA: Diagnosis not present

## 2017-06-11 DIAGNOSIS — M8580 Other specified disorders of bone density and structure, unspecified site: Secondary | ICD-10-CM | POA: Diagnosis not present

## 2017-06-11 DIAGNOSIS — F32A Depression, unspecified: Secondary | ICD-10-CM

## 2017-06-11 LAB — COMPREHENSIVE METABOLIC PANEL
ALT: 15 U/L (ref 0–35)
AST: 22 U/L (ref 0–37)
Albumin: 3.8 g/dL (ref 3.5–5.2)
Alkaline Phosphatase: 60 U/L (ref 39–117)
BUN: 26 mg/dL — ABNORMAL HIGH (ref 6–23)
CO2: 31 mEq/L (ref 19–32)
Calcium: 9 mg/dL (ref 8.4–10.5)
Chloride: 105 mEq/L (ref 96–112)
Creatinine, Ser: 1.11 mg/dL (ref 0.40–1.20)
GFR: 50.03 mL/min — ABNORMAL LOW (ref >60.00)
Glucose, Bld: 93 mg/dL (ref 70–99)
Potassium: 5.3 mEq/L — ABNORMAL HIGH (ref 3.5–5.1)
Sodium: 142 mEq/L (ref 135–145)
Total Bilirubin: 0.4 mg/dL (ref 0.2–1.2)
Total Protein: 6.6 g/dL (ref 6.0–8.3)

## 2017-06-11 LAB — HEMOGLOBIN A1C: Hgb A1c MFr Bld: 5.9 % (ref 4.6–6.5)

## 2017-06-11 LAB — TSH: TSH: 0.97 u[IU]/mL (ref 0.35–4.50)

## 2017-06-11 MED ORDER — CLONAZEPAM 1 MG PO TABS: 0.5000 mg | ORAL_TABLET | Freq: Every day | ORAL | 2 refills | Status: DC

## 2017-06-11 NOTE — Assessment & Plan Note (Signed)
Taking clonazepam for years - no side effects Will continue - refilled Advised to only take 1/2 - discussed concerns of making memory worse

## 2017-06-11 NOTE — Assessment & Plan Note (Signed)
Controlled, stable Continue current dose of medication  

## 2017-06-11 NOTE — Assessment & Plan Note (Signed)
Check a1c Low sugar / carb diet Stressed regular exercise   

## 2017-06-11 NOTE — Assessment & Plan Note (Signed)
BP well controlled Current regimen effective and well tolerated Continue current medications at current doses cmp  

## 2017-06-11 NOTE — Assessment & Plan Note (Signed)
Will check dexa - ordered Smoking - does not want to quit Not exercising - encouraged increased exercise Taking calcium and vitamin d

## 2017-06-11 NOTE — Assessment & Plan Note (Signed)
Check lipid panel  Continue daily statin Regular exercise and healthy diet encouraged  

## 2017-06-11 NOTE — Assessment & Plan Note (Signed)
Check tsh  Titrate med dose if needed  
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
## 2017-06-14 ENCOUNTER — Encounter: Payer: Self-pay | Admitting: Internal Medicine

## 2017-06-14 ENCOUNTER — Ambulatory Visit (INDEPENDENT_AMBULATORY_CARE_PROVIDER_SITE_OTHER)
Admission: RE | Admit: 2017-06-14 | Discharge: 2017-06-14 | Disposition: A | Discharge status: Home / Self Care | Payer: PPO | Source: Ambulatory Visit | Attending: Internal Medicine | Admitting: Internal Medicine

## 2017-06-14 ENCOUNTER — Other Ambulatory Visit: Payer: PPO

## 2017-06-14 DIAGNOSIS — M8580 Other specified disorders of bone density and structure, unspecified site: Secondary | ICD-10-CM

## 2017-06-14 DIAGNOSIS — Z13820 Encounter for screening for osteoporosis: Secondary | ICD-10-CM

## 2017-06-14 DIAGNOSIS — M810 Age-related osteoporosis without current pathological fracture: Secondary | ICD-10-CM | POA: Insufficient documentation

## 2017-06-14 DIAGNOSIS — M8588 Other specified disorders of bone density and structure, other site: Secondary | ICD-10-CM

## 2017-06-18 ENCOUNTER — Encounter: Payer: Self-pay | Admitting: Surgery

## 2017-06-18 ENCOUNTER — Ambulatory Visit: Payer: PPO | Admitting: Surgery

## 2017-06-18 ENCOUNTER — Ambulatory Visit (HOSPITAL_COMMUNITY)
Admission: RE | Admit: 2017-06-18 | Discharge: 2017-06-18 | Disposition: A | Discharge status: Home / Self Care | Payer: PPO | Source: Ambulatory Visit | Attending: Surgery | Admitting: Surgery

## 2017-06-18 VITALS — BP 152/76 | HR 70 | Temp 98.5°F | Resp 16 | Ht 65.0 in | Wt 117.8 lb

## 2017-06-18 DIAGNOSIS — K551 Chronic vascular disorders of intestine: Secondary | ICD-10-CM | POA: Diagnosis not present

## 2017-06-18 DIAGNOSIS — I6523 Occlusion and stenosis of bilateral carotid arteries: Secondary | ICD-10-CM
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
## 2017-06-20 NOTE — Addendum Note (Signed)
Addended by: Lianne Cure A on: 06/20/2017 04:17 PM   Modules accepted: Orders

## 2017-06-27 ENCOUNTER — Telehealth: Payer: Self-pay

## 2017-06-27 NOTE — Telephone Encounter (Signed)
Start prolia per dr burns---I will check with insurance and call patient back to discuss summary of benefits

## 2017-07-13 ENCOUNTER — Encounter: Payer: Self-pay | Admitting: Neurology

## 2017-07-13 ENCOUNTER — Ambulatory Visit: Payer: PPO | Admitting: Neurology

## 2017-07-13 VITALS — BP 115/61 | HR 74 | Ht 65.0 in | Wt 116.0 lb

## 2017-07-13 DIAGNOSIS — G7000 Myasthenia gravis without (acute) exacerbation: Secondary | ICD-10-CM

## 2017-07-13 DIAGNOSIS — R413 Other amnesia: Secondary | ICD-10-CM | POA: Diagnosis not present

## 2017-07-13 MED ORDER — TOPIRAMATE 25 MG PO TABS: 25.0000 mg | ORAL_TABLET | Freq: Every day | ORAL | 3 refills | Status: DC

## 2017-07-13 MED ORDER — DONEPEZIL HCL 10 MG PO TABS: 10.0000 mg | ORAL_TABLET | Freq: Every day | ORAL | 3 refills | Status: DC
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
## 2017-07-13 NOTE — Patient Instructions (Addendum)
   We will go up on the Aricept (donepezil) to 10 mg at night.   We will start Topamax at night for the headache.  Topamax (topiramate) is a seizure medication that has an FDA approval for seizures and for migraine headache. Potential side effects of this medication include weight loss, cognitive slowing, tingling in the fingers and toes, and carbonated drinks will taste bad. If any significant side effects are noted on this drug, please contact our office.

## 2017-07-18 ENCOUNTER — Encounter: Payer: Self-pay | Admitting: Neurology

## 2017-07-28 ENCOUNTER — Other Ambulatory Visit: Payer: Self-pay | Admitting: Internal Medicine

## 2017-07-29 ENCOUNTER — Other Ambulatory Visit: Payer: Self-pay | Admitting: Internal Medicine

## 2017-08-18 ENCOUNTER — Other Ambulatory Visit: Payer: Self-pay | Admitting: Internal Medicine

## 2017-08-20 ENCOUNTER — Telehealth: Payer: Self-pay

## 2017-08-20 NOTE — Telephone Encounter (Signed)
Per dr burns, patient has been recommended to start prolia injections to treat osteoporosis---patient advised that her summary of benefits states that insurance will cover cost except for estimated copay of $220---there is patient assistance funding if she would like to see if she qualifies for that---patient did not want to try for funding and said she could not afford on her own either.  Patient advised that she is at high risk for fracture if she were to fall, so to be extremely careful when walking, performing ADL's, patient repeated back for understanding---she will call back if she changes her mind

## 2017-08-28 ENCOUNTER — Encounter: Payer: Self-pay | Admitting: Cardiology

## 2017-09-09 ENCOUNTER — Other Ambulatory Visit: Payer: Self-pay | Admitting: Internal Medicine

## 2017-09-10 ENCOUNTER — Encounter: Payer: Self-pay | Admitting: Internal Medicine

## 2017-09-10 NOTE — Telephone Encounter (Signed)
[place] Controlled Substance Database checked. Last filled on 08/13/17

## 2017-09-11 MED ORDER — CLONAZEPAM 0.5 MG PO TABS: 0.5000 mg | ORAL_TABLET | Freq: Every day | ORAL | 1 refills | Status: DC

## 2017-09-11 NOTE — Telephone Encounter (Signed)
I do not want her taking more than 0.5 mg of clonazepam at night -- change to 0.5 mg tablet so it does not crumble -- a higher dose is not safe, especially at her age.  She can try adding melatonin to the clonazepam at night, which may help.    Not sure which injection she is talking about.

## 2017-09-11 NOTE — Telephone Encounter (Signed)
Spoke with pt, she is okay with changing to 0.5mg . She has not picked up the RX we sent in yesterday.   Pt would like [name] to contact her Daughter on more information about the Prolia shot.

## 2017-09-17 ENCOUNTER — Ambulatory Visit: Payer: PPO | Admitting: Surgery

## 2017-09-17 ENCOUNTER — Encounter (HOSPITAL_COMMUNITY): Payer: PPO

## 2017-09-21 ENCOUNTER — Telehealth: Payer: Self-pay | Admitting: Neurology

## 2017-09-21 NOTE — Telephone Encounter (Signed)
I called the patient.  The patient indicates that she does have occasional headaches, the headaches are not daily in nature, and do not last long when she does get them.  She has loss a minimal amount of weight, she is eating relatively well.  She claimed that she is sleeping well at night.  The patient contradicts what the daughter saying, the patient believes that she is doing okay.  I have asked the patient to contact me if there are any concerns.

## 2017-09-21 NOTE — Telephone Encounter (Signed)
Pts daughter called stating the pt hasnt been eating nor sleeping due to an increased amount of terrible headaches. Daughter is very worried and would like a call back

## 2017-09-21 NOTE — Telephone Encounter (Signed)
[name] is not listed on a DPR valid for [facility] to use. [name] is only listed on [facility], valid only for that office.  I called pt. I explained to her that [name] called [place] concerned about her headaches, sleeping, and eating. Pt says that she gets occasional headaches and is able to find relief with ibuprofen. Pt is taking the topamax as Dr. Jannifer Franklin recommended. I offered pt an appt with Dr. Jannifer Franklin to discuss but pt declined this appt. I advised her that I will pass along this conversation to Dr. Jannifer Franklin as an Brandi Raymond. Pt verbalized understanding appreciation and understanding.

## 2017-10-18 ENCOUNTER — Other Ambulatory Visit: Payer: Self-pay | Admitting: Surgery

## 2017-10-18 DIAGNOSIS — K551 Chronic vascular disorders of intestine: Secondary | ICD-10-CM

## 2017-10-22 ENCOUNTER — Ambulatory Visit (HOSPITAL_COMMUNITY)
Admission: RE | Admit: 2017-10-22 | Discharge: 2017-10-22 | Disposition: A | Discharge status: Home / Self Care | Payer: PPO | Source: Ambulatory Visit | Attending: Surgery | Admitting: Surgery

## 2017-10-22 ENCOUNTER — Encounter: Payer: Self-pay | Admitting: Surgery

## 2017-10-22 ENCOUNTER — Other Ambulatory Visit: Payer: Self-pay

## 2017-10-22 ENCOUNTER — Ambulatory Visit: Payer: PPO | Admitting: Surgery

## 2017-10-22 VITALS — BP 176/92 | HR 68 | Resp 18 | Ht 65.0 in | Wt 114.0 lb

## 2017-10-22 DIAGNOSIS — I6523 Occlusion and stenosis of bilateral carotid arteries: Secondary | ICD-10-CM | POA: Diagnosis not present

## 2017-10-22 DIAGNOSIS — K551 Chronic vascular disorders of intestine: Secondary | ICD-10-CM | POA: Diagnosis not present

## 2017-10-22 DIAGNOSIS — I774 Celiac artery compression syndrome: Secondary | ICD-10-CM | POA: Insufficient documentation

## 2017-10-22 DIAGNOSIS — Z95828 Presence of other vascular implants and grafts: Secondary | ICD-10-CM | POA: Diagnosis not present

## 2017-10-22 DIAGNOSIS — R109 Unspecified abdominal pain: Secondary | ICD-10-CM | POA: Insufficient documentation
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
## 2017-11-07 ENCOUNTER — Other Ambulatory Visit: Payer: Self-pay | Admitting: Internal Medicine

## 2017-11-08 NOTE — Telephone Encounter (Signed)
[place] Controlled Substance Database checked. Last filled on 10/12/17

## 2017-11-20 ENCOUNTER — Other Ambulatory Visit: Payer: Self-pay | Admitting: Internal Medicine

## 2017-11-22 ENCOUNTER — Other Ambulatory Visit: Payer: Self-pay | Admitting: Internal Medicine

## 2017-11-26 ENCOUNTER — Other Ambulatory Visit: Payer: Self-pay

## 2017-11-26 DIAGNOSIS — I6523 Occlusion and stenosis of bilateral carotid arteries: Secondary | ICD-10-CM

## 2017-11-26 DIAGNOSIS — K551 Chronic vascular disorders of intestine: Secondary | ICD-10-CM

## 2017-11-26 DIAGNOSIS — Z9889 Other specified postprocedural states: Secondary | ICD-10-CM

## 2017-12-11 ENCOUNTER — Ambulatory Visit: Payer: PPO | Admitting: Internal Medicine

## 2017-12-11 DIAGNOSIS — H353131 Nonexudative age-related macular degeneration, bilateral, early dry stage: Secondary | ICD-10-CM | POA: Diagnosis not present

## 2017-12-11 DIAGNOSIS — H01025 Squamous blepharitis left lower eyelid: Secondary | ICD-10-CM | POA: Diagnosis not present

## 2017-12-11 DIAGNOSIS — H02831 Dermatochalasis of right upper eyelid: Secondary | ICD-10-CM | POA: Diagnosis not present

## 2017-12-11 DIAGNOSIS — H532 Diplopia: Secondary | ICD-10-CM | POA: Diagnosis not present

## 2017-12-11 DIAGNOSIS — H01021 Squamous blepharitis right upper eyelid: Secondary | ICD-10-CM | POA: Diagnosis not present

## 2017-12-11 DIAGNOSIS — H02534 Eyelid retraction left upper eyelid: Secondary | ICD-10-CM | POA: Diagnosis not present

## 2017-12-11 DIAGNOSIS — H01024 Squamous blepharitis left upper eyelid: Secondary | ICD-10-CM | POA: Diagnosis not present

## 2017-12-11 DIAGNOSIS — H02834 Dermatochalasis of left upper eyelid: Secondary | ICD-10-CM | POA: Diagnosis not present

## 2017-12-11 DIAGNOSIS — H01022 Squamous blepharitis right lower eyelid: Secondary | ICD-10-CM | POA: Diagnosis not present

## 2017-12-12 NOTE — Patient Instructions (Addendum)
  Test(s) ordered today. Your results will be released to MyChart (or called to you) after review, usually within 72hours after test completion. If any changes need to be made, you will be notified at that same time.  Medications reviewed and updated.  No changes recommended at this time.    Please followup in 6 months   

## 2017-12-12 NOTE — Progress Notes (Signed)
Subjective:    Patient ID: Brandi Raymond, female    DOB: March 10, 1934, 84 y.o.   MRN: [id number]  HPI The patient is here for follow up.  Hypertension: She is taking her medication daily. She is compliant with a low sodium diet.  She denies chest pain, palpitations, edema, shortness of breath and regular headaches. She is not exercising regularly.  She does not monitor her blood pressure at home.    Prediabetes:  She is not compliant with a low sugar/carbohydrate diet.  She is not exercising regularly.  Hypothyroidism:  She is taking her medication daily.  She denies any recent changes in energy or weight that are unexplained.   Hyperlipidemia: She is taking her medication daily. She is compliant with a low fat/cholesterol diet. She is not exercising regularly. She denies myalgias.   Depression: She is taking her medication daily as prescribed. She denies any side effects from the medication. She feels her depression is well controlled and she is happy with her current dose of medication.   Sleep disorder:   She is taking clonazepam nightly and has done this for years.  Her sleep is well controlled.  She denies side effects.    Neuropathy: She has a feeling of nerves in her left lower leg.  Both legs hurt.  She takes gabapentin at night. She notices it at night.    Medications and allergies reviewed with patient and updated if appropriate.  Patient Active Problem List   Diagnosis Date Noted  . Osteoporosis 06/14/2017  . Prediabetes 06/23/2015  . Depression 06/23/2015  . Carotid stenosis 10/12/2014  . Lung nodule 07/07/2014  . Sleep disorder 04/09/2014  . Mesenteric artery stenosis ([place]) 01/13/2013  . Symptomatic bradycardia 12/03/2012  . Thoracic aneurysm without mention of rupture 11/04/2012  . Chronic mesenteric ischemia ([place]) 10/08/2012  . Memory deficit 06/20/2012  . Irritable bowel syndrome 06/20/2012  . Ocular myasthenia gravis ([place]) 06/20/2012  . PVD (peripheral  vascular disease) ([place]) 06/20/2012  . Hereditary and idiopathic peripheral neuropathy 05/22/2012  . Syncope 08/28/2011  . EXOPHTHALMOS 10/18/2009  . ABDOMINAL BRUIT 08/17/2009  . Coronary atherosclerosis 09/05/2008  . Hypothyroidism 05/26/2008  . VITAMIN D DEFICIENCY 05/26/2008  . Osteopenia 05/12/2008  . CHOLELITHIASIS 12/06/2007  . DIVERTICULOSIS, COLON 12/05/2007  . HYPERLIPIDEMIA 08/19/2007  . OTHER EMPHYSEMA 08/19/2007  . CIGARETTE SMOKER 02/06/2007  . Essential hypertension 02/06/2007  . COLONIC POLYPS 11/21/2004    Current Outpatient Medications on File Prior to Visit  Medication Sig Dispense Refill  . aspirin 81 MG tablet Take 81 mg by mouth daily.      . Calcium Carb-Cholecalciferol (CALCIUM 500 +D) 500-400 MG-UNIT TABS One tablet twice daily 60 tablet   . Cholecalciferol (VITAMIN D3 PO) Take 1,000 Units by mouth daily.    . clonazePAM (KLONOPIN) 0.5 MG tablet TAKE 1 TABLET (0.5 MG TOTAL) BY MOUTH AT BEDTIME. 30 tablet 1  . clopidogrel (PLAVIX) 75 MG tablet TAKE 1 TABLET BY MOUTH DAILY 90 tablet 3  . donepezil (ARICEPT) 10 MG tablet Take 1 tablet (10 mg total) by mouth at bedtime. 90 tablet 3  . gabapentin (NEURONTIN) 300 MG capsule TAKE ONE CAPSULE TWICE DAILY AND 2 AT NIGHT = FOUR TOTAL DAILY. 360 capsule 2  . levothyroxine (SYNTHROID, LEVOTHROID) 125 MCG tablet TAKE 1 TABLET BY MOUTH EVERY DAY 90 tablet 0  . lisinopril (PRINIVIL,ZESTRIL) 5 MG tablet TAKE 1 TABLET (5 MG TOTAL) BY MOUTH 2 (TWO) TIMES DAILY. 180 tablet 1  . Polyethyl  Glycol-Propyl Glycol (SYSTANE) 0.4-0.3 % GEL Place 1 drop into both eyes daily as needed (for dry eyes).     . pravastatin (PRAVACHOL) 40 MG tablet TAKE 1 TABLET (40 MG TOTAL) BY MOUTH DAILY. 90 tablet 2  . sertraline (ZOLOFT) 100 MG tablet TAKE 1 TABLET (100 MG TOTAL) BY MOUTH DAILY. 90 tablet 1  . topiramate (TOPAMAX) 25 MG tablet Take 1 tablet (25 mg total) by mouth at bedtime. 30 tablet 3   No current facility-administered medications on  file prior to visit.     Past Medical History:  Diagnosis Date  . Acute MI inferior subsequent episode care [facility]) 1993   PTCA RCA  . Adenomatous colon polyp   . Arthritis   . CAD (coronary artery disease)    Dr Percival Spanish  . Carotid artery occlusion   . Cervical spine fracture ([place])   . COPD (chronic obstructive pulmonary disease) ([place])   . Depression   . Diverticulosis   . Dyslipidemia   . Gastritis 09/15/1991  . GERD (gastroesophageal reflux disease) 09/15/1991   Dr Sharlett Iles  . Hiatal hernia 09/15/1991  . Hip fracture, right ([place])   . HLD (hyperlipidemia)   . HTN (hypertension)   . Hyperplastic polyps of stomach 11/2007   colonoscopy  . Hypertension   . Hypothyroidism    affecting the left eye, proptosis  . Iron deficiency anemia   . Macular degeneration of left eye   . Memory loss   . Mesenteric artery stenosis ([place])   . Myasthenia gravis ([place])    With ocular features  . Myocardial infarction ([place]) 1993  . Ocular myasthenia gravis ([place])    Dr Jannifer Franklin  . Orthostatic hypotension 06/05/2013  . Pneumonia    hx  . PONV (postoperative nausea and vomiting)   . Shortness of breath dyspnea    occ  . Strabismus    left eye  . Syncope 1998    Past Surgical History:  Procedure Laterality Date  . ABDOMINAL AORTAGRAM N/A 10/15/2012   Procedure: ABDOMINAL Maxcine Ham;  Surgeon: Serafina Mitchell, MD;  [facility] C[facility] LAB;  Service: Cardiovascular;  Laterality: N/A;  . arm surgery Left    fx  . BALLOON ANGIOPLASTY, ARTERY  1993  . CARDIAC CATHETERIZATION  1996   LAD 20/50, CFX OK, RCA 30 at prev PTCA site, EF with mild HK inferior wall  . CAROTID ANGIOGRAM N/A 10/28/2014   Procedure: CAROTID ANGIOGRAM;  Surgeon: Serafina Mitchell, MD;  Location: [facility] CATH LAB;  Service: Cardiovascular;  Laterality: N/A;  . CAROTID ENDARTERECTOMY    . CATARACT EXTRACTION     bilateral  . COLONOSCOPY W/ POLYPECTOMY  2006   Adenomatous polyps  . ENDARTERECTOMY Left 01/14/2015   Procedure: LEFT CAROTID  ENDARTERECTOMY ;  Surgeon: Serafina Mitchell, MD;  Location: [place];  Service: Vascular;  Laterality: Left;  . ENDARTERECTOMY Right 08/12/2015   Procedure: ENDARTERECTOMY CAROTID WITH PATCH ANGIOPLASTY;  Surgeon: Serafina Mitchell, MD;  Location: [place];  Service: Vascular;  Laterality: Right;  . EYE MUSCLE SURGERY Left 11/04/2015  . EYE SURGERY Bilateral May 2016   Eyelids  . FOOT SURGERY Left   . LEG SURGERY Left    laceration  . MIDDLE EAR SURGERY Left 1970  . PERCUTANEOUS STENT INTERVENTION  12/03/2012   Procedure: PERCUTANEOUS STENT INTERVENTION;  Surgeon: Serafina Mitchell, MD;  Location[facility] CATH LAB;  Service: Cardiovascular;;  sma stent x1  . STRABISMUS SURGERY Left 10/28/2015   Procedure: REPAIR STRABISMUS LEFT  EYE;  Surgeon: Lamonte Sakai, MD;  Location: [place];  Service: Ophthalmology;  Laterality: Left;  . Third-degree Makalyn Lennox  2003   [facility]-legs ,buttocks,arms  . TOTAL ABDOMINAL HYSTERECTOMY  1973   Dysfunctional menses  . UPPER GI ENDOSCOPY      Dr Sharlett Iles  . VISCERAL ANGIOGRAM N/A 10/15/2012   Procedure: VISCERAL ANGIOGRAM;  Surgeon: Serafina Mitchell, MD;  Location: [facility] CATH LAB;  Service: Cardiovascular;  Laterality: N/A;  . VISCERAL ANGIOGRAM N/A 12/03/2012   Procedure: VISCERAL ANGIOGRAM;  Surgeon: Serafina Mitchell, MD;  Location: [facility] CATH LAB;  Service: Cardiovascular;  Laterality: N/A;  . VISCERAL ANGIOGRAM N/A 08/05/2013   Procedure: MESENTERIC ANGIOGRAM;  Surgeon: Serafina Mitchell, MD;  Location: [facility] CATH LAB;  Service: Cardiovascular;  Laterality: N/A;  . VISCERAL ANGIOGRAM N/A 10/28/2014   Procedure: VISCERAL ANGIOGRAM;  Surgeon: Serafina Mitchell, MD;  Location: [facility] CATH LAB;  Service: Cardiovascular;  Laterality: N/A;    Social History   Socioeconomic History  . Marital status: Married    Spouse name: Not on file  . Number of children: 3  . Years of education: 9th  . Highest education level: Not on file  Occupational History  . Occupation: Retired  Scientific laboratory technician  .  Financial resource strain: Not on file  . Food insecurity:    Worry: Not on file    Inability: Not on file  . Transportation needs:    Medical: Not on file    Non-medical: Not on file  Tobacco Use  . Smoking status: Light Tobacco Smoker    Packs/day: 0.25    Years: 60.00    Pack years: 15.00    Types: Cigarettes  . Smokeless tobacco: Never Used  . Tobacco comment: now 3 cigarettes/ day  Substance and Sexual Activity  . Alcohol use: No    Alcohol/week: 0.0 oz  . Drug use: No  . Sexual activity: Never  Lifestyle  . Physical activity:    Days per week: Not on file    Minutes per session: Not on file  . Stress: Not on file  Relationships  . Social connections:    Talks on phone: Not on file    Gets together: Not on file    Attends religious service: Not on file    Active member of club or organization: Not on file    Attends meetings of clubs or organizations: Not on file    Relationship status: Not on file  Other Topics Concern  . Not on file  Social History Narrative   Patient is right handed.   Patient drinks 3-4 cups of caffeine daily.    Family History  Problem Relation Age of Onset  . Throat cancer Mother        ? thyroid cancer  . Cancer Mother   . Emphysema Father   . Diabetes Father   . Heart attack Father 24  . Colon cancer Brother   . Cerebral aneurysm Brother   . Hypothyroidism Sister        X42  . Cancer Brother        Ear  . Diabetes Paternal Grandmother   . Diabetes Paternal Grandfather   . Diabetes Maternal Aunt     Review of Systems  Constitutional: Negative for chills and fever.  Respiratory: Positive for cough (dry - smokers cough) and wheezing (occ, smoking related). Negative for shortness of breath.   Cardiovascular: Negative for chest pain, palpitations and leg swelling.  Gastrointestinal: Negative  for abdominal pain.       No gerd  Neurological: Positive for numbness and headaches (occ). Negative for dizziness and light-headedness.    Psychiatric/Behavioral: Positive for dysphoric mood (controlled) and sleep disturbance (fairly controlled). The patient is not nervous/anxious.        Objective:   Vitals:   12/13/17 1044  BP: (!) 148/76  Pulse: 61  Resp: 16  Temp: 98.3 F (36.8 C)  SpO2: 90%   BP Readings from Last 3 Encounters:  12/13/17 (!) 148/76  10/22/17 (!) 176/92  07/13/17 115/61   Wt Readings from Last 3 Encounters:  12/13/17 114 lb (51.7 kg)  10/22/17 114 lb (51.7 kg)  07/13/17 116 lb (52.6 kg)   Body mass index is 18.97 kg/m.   Physical Exam    Constitutional: Appears well-developed and well-nourished. No distress.  HENT:  Head: Normocephalic and atraumatic.  Neck: Neck supple. No tracheal deviation present. No thyromegaly present.  No cervical lymphadenopathy Cardiovascular: Normal rate, regular rhythm and normal heart sounds.   2/6 systolic murmur heard. right carotid bruit .  No edema Pulmonary/Chest: Effort normal and breath sounds normal. No respiratory distress. No has no wheezes. No rales.  Skin: Skin is warm and dry. Not diaphoretic.  Psychiatric: Normal mood and affect. Behavior is normal.      Assessment & Plan:    See Problem List for Assessment and Plan of chronic medical problems.

## 2017-12-13 ENCOUNTER — Encounter: Payer: Self-pay | Admitting: Internal Medicine

## 2017-12-13 ENCOUNTER — Other Ambulatory Visit (INDEPENDENT_AMBULATORY_CARE_PROVIDER_SITE_OTHER): Payer: PPO

## 2017-12-13 ENCOUNTER — Ambulatory Visit (INDEPENDENT_AMBULATORY_CARE_PROVIDER_SITE_OTHER): Payer: PPO | Admitting: Internal Medicine

## 2017-12-13 VITALS — BP 148/76 | HR 61 | Temp 98.3°F | Resp 16 | Wt 114.0 lb

## 2017-12-13 DIAGNOSIS — R7303 Prediabetes: Secondary | ICD-10-CM | POA: Diagnosis not present

## 2017-12-13 DIAGNOSIS — E039 Hypothyroidism, unspecified: Secondary | ICD-10-CM | POA: Diagnosis not present

## 2017-12-13 DIAGNOSIS — G479 Sleep disorder, unspecified: Secondary | ICD-10-CM

## 2017-12-13 DIAGNOSIS — E782 Mixed hyperlipidemia: Secondary | ICD-10-CM

## 2017-12-13 DIAGNOSIS — I10 Essential (primary) hypertension: Secondary | ICD-10-CM

## 2017-12-13 DIAGNOSIS — G609 Hereditary and idiopathic neuropathy, unspecified: Secondary | ICD-10-CM | POA: Diagnosis not present

## 2017-12-13 DIAGNOSIS — F329 Major depressive disorder, single episode, unspecified: Secondary | ICD-10-CM | POA: Diagnosis not present

## 2017-12-13 DIAGNOSIS — F32A Depression, unspecified: Secondary | ICD-10-CM

## 2017-12-13 LAB — COMPREHENSIVE METABOLIC PANEL
ALT: 11 U/L (ref 0–35)
AST: 20 U/L (ref 0–37)
Albumin: 4.1 g/dL (ref 3.5–5.2)
Alkaline Phosphatase: 56 U/L (ref 39–117)
BUN: 19 mg/dL (ref 6–23)
CO2: 33 mEq/L — ABNORMAL HIGH (ref 19–32)
Calcium: 9.1 mg/dL (ref 8.4–10.5)
Chloride: 102 mEq/L (ref 96–112)
Creatinine, Ser: 0.77 mg/dL (ref 0.40–1.20)
GFR: 76.21 mL/min (ref >60.00)
Glucose, Bld: 96 mg/dL (ref 70–99)
Potassium: 5 mEq/L (ref 3.5–5.1)
Sodium: 141 mEq/L (ref 135–145)
Total Bilirubin: 0.4 mg/dL (ref 0.2–1.2)
Total Protein: 7 g/dL (ref 6.0–8.3)

## 2017-12-13 LAB — LIPID PANEL
Cholesterol: 200 mg/dL (ref 0–200)
HDL: 92.7 mg/dL (ref >39.00)
LDL Cholesterol: 83 mg/dL (ref 0–99)
NonHDL: 107.47
Total CHOL/HDL Ratio: 2
Triglycerides: 120 mg/dL (ref 0.0–149.0)
VLDL: 24 mg/dL (ref 0.0–40.0)

## 2017-12-13 LAB — CBC WITH DIFFERENTIAL/PLATELET
Basophils Absolute: 0.1 10*3/uL (ref 0.0–0.1)
Basophils Relative: 1.4 % (ref 0.0–3.0)
Eosinophils Absolute: 0.1 10*3/uL (ref 0.0–0.7)
Eosinophils Relative: 1.1 % (ref 0.0–5.0)
HCT: 45.8 % (ref 36.0–46.0)
Hemoglobin: 14.9 g/dL (ref 12.0–15.0)
Lymphocytes Relative: 19.4 % (ref 12.0–46.0)
Lymphs Abs: 1.3 10*3/uL (ref 0.7–4.0)
MCHC: 32.6 g/dL (ref 30.0–36.0)
MCV: 88.8 fl (ref 78.0–100.0)
Monocytes Absolute: 0.6 10*3/uL (ref 0.1–1.0)
Monocytes Relative: 9.8 % (ref 3.0–12.0)
Neutro Abs: 4.5 10*3/uL (ref 1.4–7.7)
Neutrophils Relative %: 68.3 % (ref 43.0–77.0)
Platelets: 144 10*3/uL — ABNORMAL LOW (ref 150.0–400.0)
RBC: 5.16 Mil/uL — ABNORMAL HIGH (ref 3.87–5.11)
RDW: 14.9 % (ref 11.5–15.5)
WBC: 6.5 10*3/uL (ref 4.0–10.5)

## 2017-12-13 LAB — TSH: TSH: 1.38 u[IU]/mL (ref 0.35–4.50)

## 2017-12-13 LAB — HEMOGLOBIN A1C: Hgb A1c MFr Bld: 6 % (ref 4.6–6.5)

## 2017-12-13 NOTE — Assessment & Plan Note (Signed)
Check a1c Low sugar / carb diet Stressed regular exercise   

## 2017-12-13 NOTE — Assessment & Plan Note (Signed)
Dr Jannifer Franklin Taking gabapentin

## 2017-12-13 NOTE — Assessment & Plan Note (Signed)
Controlled, stable Continue current dose of medication  

## 2017-12-13 NOTE — Assessment & Plan Note (Signed)
Check tsh  Titrate med dose if needed  

## 2017-12-13 NOTE — Assessment & Plan Note (Signed)
BP well controlled Current regimen effective and well tolerated Continue current medications at current doses cmp  

## 2017-12-13 NOTE — Assessment & Plan Note (Signed)
Taking clonazepam nightly Sleep well controlled continue

## 2017-12-15 ENCOUNTER — Encounter: Payer: Self-pay | Admitting: Internal Medicine

## 2018-01-07 ENCOUNTER — Other Ambulatory Visit: Payer: Self-pay | Admitting: Internal Medicine

## 2018-01-07 NOTE — Telephone Encounter (Signed)
[place] Controlled Substance Database checked. Last filled on 12/12/17

## 2018-01-14 ENCOUNTER — Encounter: Payer: Self-pay | Admitting: Adult Health

## 2018-01-14 ENCOUNTER — Ambulatory Visit: Payer: PPO | Admitting: Adult Health

## 2018-01-14 VITALS — BP 129/61 | HR 69 | Ht 65.0 in | Wt 114.0 lb

## 2018-01-14 DIAGNOSIS — R413 Other amnesia: Secondary | ICD-10-CM

## 2018-01-14 DIAGNOSIS — G7000 Myasthenia gravis without (acute) exacerbation: Secondary | ICD-10-CM | POA: Diagnosis not present

## 2018-01-14 DIAGNOSIS — R519 Headache, unspecified: Secondary | ICD-10-CM

## 2018-01-14 DIAGNOSIS — R51 Headache: Secondary | ICD-10-CM
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
## 2018-01-14 NOTE — Patient Instructions (Signed)
Your Plan:  Continue Aricept  Memory score is stable Can start Topamax for headaches If your symptoms worsen or you develop new symptoms please let us know.    Thank you for coming to see [place] at [facility]. I hope we have been able to provide you high quality care today.  You may receive a patient satisfaction survey over the next few weeks. We would appreciate your feedback and comments so that we may continue to improve ourselves and the health of our patients.

## 2018-01-14 NOTE — Progress Notes (Signed)
I have read the note, and I agree with the clinical assessment and plan.  Charles K Willis   

## 2018-01-18 ENCOUNTER — Other Ambulatory Visit: Payer: Self-pay | Admitting: Internal Medicine

## 2018-02-07 ENCOUNTER — Other Ambulatory Visit: Payer: Self-pay | Admitting: Adult Health

## 2018-02-11 ENCOUNTER — Encounter: Payer: Self-pay | Admitting: Internal Medicine

## 2018-02-11 ENCOUNTER — Ambulatory Visit (INDEPENDENT_AMBULATORY_CARE_PROVIDER_SITE_OTHER): Payer: PPO | Admitting: Internal Medicine

## 2018-02-11 VITALS — BP 120/70 | HR 74 | Temp 98.2°F | Ht 65.0 in | Wt 115.0 lb

## 2018-02-11 DIAGNOSIS — H9202 Otalgia, left ear: Secondary | ICD-10-CM | POA: Diagnosis not present

## 2018-02-11 MED ORDER — NEOMYCIN-POLYMYXIN-HC 3.5-10000-1 OT SUSP: 3.0000 [drp] | Freq: Three times a day (TID) | OTIC | 0 refills | Status: DC

## 2018-02-11 NOTE — Patient Instructions (Addendum)
We have sent in ear drops to use 3 drops 3 times per day for about 1 week in the left ear.   Make sure to avoid using anything in the ear including q tips, paper clips, hairpins, etc as these can damage the ear canal.   You can also use about a pea sized amount of vaseline on the outside of the ear opening to help with moisture.

## 2018-02-11 NOTE — Assessment & Plan Note (Signed)
Referral to ENT given past surgery on that ear. No visible ulcerations in the canal on exam. Rx for cortisporin ear drops to help with irritation. Advised not to put anything in the ear. Can use vaseline on the external ear for dryness.

## 2018-02-11 NOTE — Progress Notes (Signed)
   Subjective:    Patient ID: Brandi Raymond, female    DOB: 06-23-1934, 84 y.o.   MRN: [id number]  HPI The patient is an 84 YO female coming in for left ear pain and bleeding. Was bleeding some over the weekend. There was 1 episode of bleeding. This has not happened before. She denies picking it or putting anything in it. Has some bruising on her right temple which has been present for about 1 week now. She denies nose drainage or sinus pressure. Has headaches which are chronic and mildly worse. Denies fevers or chills. Denies cough or SOB. Denies hearing changes. No ear pain except when touching her ear. Does have artifical ear drum per patient surgery done in her 33s unclear reason.   Review of Systems  Constitutional: Negative for activity change, appetite change, chills, fatigue, fever and unexpected weight change.  HENT: Positive for ear discharge and ear pain. Negative for congestion, hearing loss, postnasal drip, rhinorrhea, sinus pressure, sinus pain, sneezing, sore throat, tinnitus, trouble swallowing and voice change.   Eyes: Negative.   Respiratory: Negative for cough, chest tightness, shortness of breath and wheezing.   Cardiovascular: Negative.   Gastrointestinal: Negative.   Musculoskeletal: Negative.   Neurological: Negative.       Objective:   Physical Exam  Constitutional: She is oriented to person, place, and time. She appears well-developed and well-nourished.  HENT:  Head: Normocephalic and atraumatic.  Oropharynx normal, nose with normal turbinates, TMs normal right, left not clear visualization due to dried blood in the ear canal. No active bleeding or ulceration visualized.   Eyes: EOM are normal.  Neck: Normal range of motion. No thyromegaly present.  Cardiovascular: Normal rate and regular rhythm.  Pulmonary/Chest: Effort normal and breath sounds normal. No respiratory distress. She has no wheezes. She has no rales.  Abdominal: Soft.  Lymphadenopathy:    She  has no cervical adenopathy.  Neurological: She is alert and oriented to person, place, and time.  Skin: Skin is warm and dry.   Vitals:   02/11/18 1202  BP: 120/70  Pulse: 74  Temp: 98.2 F (36.8 C)  TempSrc: Oral  SpO2: 91%  Weight: 115 lb (52.2 kg)  Height: 5\' 5"  (1.651 m)      Assessment & Plan:

## 2018-02-17 ENCOUNTER — Other Ambulatory Visit: Payer: Self-pay | Admitting: Internal Medicine

## 2018-02-23 ENCOUNTER — Other Ambulatory Visit: Payer: Self-pay | Admitting: Internal Medicine

## 2018-03-06 ENCOUNTER — Telehealth: Payer: Self-pay | Admitting: Internal Medicine

## 2018-03-06 NOTE — Telephone Encounter (Unsigned)
Copied from [place] [phone]. Topic: Quick Communication - See Telephone Encounter >> Mar 06, 2018  4:32 PM Neva Seat wrote: Pt hasn't heard back from her ENT referral from the visit on July 29th.  Please

## 2018-03-07 NOTE — Telephone Encounter (Signed)
Referral faxed to [facility] They will contact pt Pt's dtr is aware

## 2018-03-12 ENCOUNTER — Other Ambulatory Visit: Payer: Self-pay | Admitting: Internal Medicine

## 2018-03-12 NOTE — Telephone Encounter (Signed)
[place] Controlled Substance Database checked. Last filled on 02/10/18

## 2018-03-28 DIAGNOSIS — H9209 Otalgia, unspecified ear: Secondary | ICD-10-CM | POA: Diagnosis not present

## 2018-03-28 DIAGNOSIS — H60311 Diffuse otitis externa, right ear: Secondary | ICD-10-CM | POA: Diagnosis not present

## 2018-04-12 DIAGNOSIS — H60311 Diffuse otitis externa, right ear: Secondary | ICD-10-CM | POA: Diagnosis not present

## 2018-04-25 ENCOUNTER — Other Ambulatory Visit: Payer: Self-pay

## 2018-04-25 ENCOUNTER — Ambulatory Visit (INDEPENDENT_AMBULATORY_CARE_PROVIDER_SITE_OTHER)
Admission: RE | Admit: 2018-04-25 | Discharge: 2018-04-25 | Disposition: A | Discharge status: Home / Self Care | Payer: PPO | Source: Ambulatory Visit

## 2018-04-25 ENCOUNTER — Ambulatory Visit (HOSPITAL_COMMUNITY)
Admission: RE | Admit: 2018-04-25 | Discharge: 2018-04-25 | Disposition: A | Discharge status: Home / Self Care | Payer: PPO | Source: Ambulatory Visit | Attending: Surgery | Admitting: Surgery

## 2018-04-25 ENCOUNTER — Encounter: Payer: Self-pay | Admitting: Family

## 2018-04-25 ENCOUNTER — Ambulatory Visit: Payer: PPO | Admitting: Family

## 2018-04-25 VITALS — BP 118/75 | HR 69 | Temp 98.5°F | Resp 16 | Ht 65.0 in | Wt 115.8 lb

## 2018-04-25 DIAGNOSIS — Z9889 Other specified postprocedural states: Secondary | ICD-10-CM | POA: Diagnosis not present

## 2018-04-25 DIAGNOSIS — I6523 Occlusion and stenosis of bilateral carotid arteries: Secondary | ICD-10-CM | POA: Diagnosis not present

## 2018-04-25 DIAGNOSIS — F17200 Nicotine dependence, unspecified, uncomplicated: Secondary | ICD-10-CM | POA: Diagnosis not present

## 2018-04-25 DIAGNOSIS — K551 Chronic vascular disorders of intestine: Secondary | ICD-10-CM
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
## 2018-04-25 NOTE — Patient Instructions (Addendum)
Before your next abdominal ultrasound:  Avoid gas forming foods and beverages the day before the test.   Take two Extra-Strength Gas-X capsules at bedtime the night before the test. Take another two Extra-Strength Gas-X capsules in the middle of the night if you get up to the restroom, if not, first thing in the morning with water.  Do not chew gum.       Steps to Quit Smoking Smoking tobacco can be bad for your health. It can also affect almost every organ in your body. Smoking puts you and people around you at risk for many serious long-lasting (chronic) diseases. Quitting smoking is hard, but it is one of the best things that you can do for your health. It is never too late to quit. What are the benefits of quitting smoking? When you quit smoking, you lower your risk for getting serious diseases and conditions. They can include:  Lung cancer or lung disease.  Heart disease.  Stroke.  Heart attack.  Not being able to have children (infertility).  Weak bones (osteoporosis) and broken bones (fractures).  If you have coughing, wheezing, and shortness of breath, those symptoms may get better when you quit. You may also get sick less often. If you are pregnant, quitting smoking can help to lower your chances of having a baby of low birth weight. What can I do to help me quit smoking? Talk with your doctor about what can help you quit smoking. Some things you can do (strategies) include:  Quitting smoking totally, instead of slowly cutting back how much you smoke over a period of time.  Going to in-person counseling. You are more likely to quit if you go to many counseling sessions.  Using resources and support systems, such as: ? Database administrator with a Social worker. ? Phone quitlines. ? Careers information officer. ? Support groups or group counseling. ? Text messaging programs. ? Mobile phone apps or applications.  Taking medicines. Some of these medicines may have nicotine in  them. If you are pregnant or breastfeeding, do not take any medicines to quit smoking unless your doctor says it is okay. Talk with your doctor about counseling or other things that can help you.  Talk with your doctor about using more than one strategy at the same time, such as taking medicines while you are also going to in-person counseling. This can help make quitting easier. What things can I do to make it easier to quit? Quitting smoking might feel very hard at first, but there is a lot that you can do to make it easier. Take these steps:  Talk to your family and friends. Ask them to support and encourage you.  Call phone quitlines, reach out to support groups, or work with a Social worker.  Ask people who smoke to not smoke around you.  Avoid places that make you want (trigger) to smoke, such as: ? Bars. ? Parties. ? Smoke-break areas at work.  Spend time with people who do not smoke.  Lower the stress in your life. Stress can make you want to smoke. Try these things to help your stress: ? Getting regular exercise. ? Deep-breathing exercises. ? Yoga. ? Meditating. ? Doing a body scan. To do this, close your eyes, focus on one area of your body at a time from head to toe, and notice which parts of your body are tense. Try to relax the muscles in those areas.  Download or buy apps on your mobile phone or tablet  that can help you stick to your quit plan. There are many free apps, such as QuitGuide from the [employer] Office manager for Disease Control and Prevention). You can find more support from smokefree.gov and other websites.  This information is not intended to replace advice given to you by your health care provider. Make sure you discuss any questions you have with your health care provider. Document Released: 04/29/2009 Document Revised: 02/29/2016 Document Reviewed: 11/17/2014 Elsevier Interactive Patient Education  2018 [employer].     Chronic Mesenteric Ischemia Mesenteric  ischemia is poor blood flow (circulation) in the vessels that supply blood to the stomach, intestines, and liver (mesenteric organs). Chronic mesenteric ischemia, also called mesenteric angina or intestinal angina, is a long-term (chronic) condition. It happens when an artery or vein that provides blood to the mesenteric organs gradually becomes blocked or narrow, restricting the blood supply to the organs. When the blood supply is severely restricted, the mesenteric organs cannot work properly. What are the causes? This condition is commonly caused by fatty deposits that build up in an artery (plaque), which can narrow the artery and restrict blood flow. Other causes include:  Weakened areas in blood vessel walls (aneurysms).  Conditions that cause twisting or inflammation of blood vessels, such as fibromuscular dysplasia or arteritis.  A disorder in which blood clots form in the veins (venous thrombosis).  Scarring and thickening (fibrosis) of blood vessels caused by radiation therapy.  A tear in the aorta, the body's main artery (aortic dissection).  Blood vessel problems after illegal drug use, such as use of cocaine.  Tumors in the nervous system (neurofibromatosis).  Certain autoimmune diseases, such as lupus.  What increases the risk? The following factors may make you more likely to develop this condition:  Being female.  Being over age 36, especially if you have a history of heart problems.  Smoking.  Congestive heart failure.  Irregular heartbeat (arrhythmia).  Having a history of heart attack or stroke.  Diabetes.  High cholesterol.  High blood pressure (hypertension).  Being overweight or obese.  Kidney disease (renal disease) requiring dialysis.  What are the signs or symptoms? Symptoms of this condition include:  Abdomen (abdominal) pain or cramps that develop 15-60 minutes after a meal. This pain may last for 1-3 hours. Some people may develop a fear of  eating because of this symptom.  Weight loss.  Diarrhea.  Bloody stool.  Nausea.  Vomiting.  Bloating.  Abdominal pain after stress or with exercise.  How is this diagnosed? This condition is diagnosed based on:  Your medical history.  A physical exam.  Tests, such as: ? Ultrasound. ? CT scan. ? Blood tests. ? Urine tests. ? An imaging test that involves injecting a dye into your arteries to show blood flow through blood vessels (angiogram). This can help to show if there are any blockages in the vessels that lead to the intestines. ? Passing a small probe through the mouth and into the stomach to measure the output of carbon dioxide (gastric tonometry). This can help to indicate whether there is decreased blood flow to the stomach and intestines.  How is this treated? This condition may be treated with:  Dietary changes such as eating smaller, low-fat, meals more frequently.  Lifestyle changes to treat underlying conditions that contribute to the disease, such as high cholesterol and high blood pressure.  Medicines to reduce blood clotting and increase blood flow.  Surgery to remove the blockage, repair arteries or veins, and restore  blood flow. This may involve: ? Angioplasty. This is surgery to widen the affected artery, reduce the blockage, and sometimes insert a small, mesh tube (stent). ? Bypass surgery. This may be done to go around (bypass) the blockage and reconnect healthy arteries or veins. ? Placing a stent in the affected area. This may be done to help keep blocked arteries open.  Follow these instructions at home: Eating and drinking  Eat a heart-healthy diet. This includes fresh fruits and vegetables, whole grains, and lean proteins like chicken, fish, eggs, and beans.  Avoid foods that contain a lot of: ? Salt (sodium). ? Sugar. ? Saturated fat (such as red meat). ? Trans fat (such as fried foods).  Stay hydrated. Drink enough fluid to keep your  urine clear or pale yellow. Lifestyle  Stay active and get regular exercise as told by your health care provider. Aim for 150 minutes of moderate activity or 75 minutes of vigorous activity a week. Ask your health care provider what activities and forms of exercise are safe for you.  Maintain a healthy weight.  Work with your health care provider to manage your cholesterol.  Manage any other health problems you have, such as high blood pressure, diabetes, or heart rhythm problems.  Do not use any products that contain nicotine or tobacco, such as cigarettes and e-cigarettes. If you need help quitting, ask your health care provider. General instructions  Take over-the-counter and prescription medicines only as told by your health care provider.  Keep all follow-up visits as told by your health care provider. This is important. Contact a health care provider if:  Your symptoms do not improve or they return after treatment.  You have a fever. Get help right away if:  You have severe abdominal pain.  You have severe chest pain.  You have shortness of breath.  You feel weak or dizzy.  You have palpitations.  You have numbness or weakness in your face, arm, or leg.  You are confused.  You have trouble speaking or people have trouble understanding what you are saying.  You are constipated.  You have trouble urinating.  You have blood in your stool.  You have severe nausea, vomiting, or persistent diarrhea. Summary  Mesenteric ischemia is poor circulation in the vessels that supply blood to the the stomach, intestines, and liver (mesenteric organs).  This condition happens when an artery or vein that provides blood to the mesenteric organs gradually becomes blocked or narrow, restricting the blood supply to the organs.  This condition is commonly caused by fatty deposits that build up in an artery (plaque), which can narrow the artery and restrict blood flow.  You are  more likely to develop this condition if you are over age 60 and have a history of heart problems, high blood pressure, diabetes, or high cholesterol.  This condition is usually treated with medicines, dietary and lifestyle changes, and surgery to remove the blockage, repair arteries or veins, and restore blood flow. This information is not intended to replace advice given to you by your health care provider. Make sure you discuss any questions you have with your health care provider. Document Released: 02/20/2011 Document Revised: 06/17/2016 Document Reviewed: 06/17/2016 Elsevier Interactive Patient Education  2017 [employer].     Stroke Prevention Some health problems and behaviors may make it more likely for you to have a stroke. Below are ways to lessen your risk of having a stroke.  Be active for at least 30 minutes  on most or all days.  Do not smoke. Try not to be around others who smoke.  Do not drink too much alcohol. ? Do not have more than 2 drinks a day if you are a man. ? Do not have more than 1 drink a day if you are a woman and are not pregnant.  Eat healthy foods, such as fruits and vegetables. If you were put on a specific diet, follow the diet as told.  Keep your cholesterol levels under control through diet and medicines. Look for foods that are low in saturated fat, trans fat, cholesterol, and are high in fiber.  If you have diabetes, follow all diet plans and take your medicine as told.  Ask your doctor if you need treatment to lower your blood pressure. If you have high blood pressure (hypertension), follow all diet plans and take your medicine as told by your doctor.  If you are 10-49 years old, have your blood pressure checked every 3-5 years. If you are age 53 or older, have your blood pressure checked every year.  Keep a healthy weight. Eat foods that are low in calories, salt, saturated fat, trans fat, and cholesterol.  Do not take drugs.  Avoid birth  control pills, if this applies. Talk to your doctor about the risks of taking birth control pills.  Talk to your doctor if you have sleep problems (sleep apnea).  Take all medicine as told by your doctor. ? You may be told to take aspirin or blood thinner medicine. Take this medicine as told by your doctor. ? Understand your medicine instructions.  Make sure any other conditions you have are being taken care of.  Get help right away if:  You suddenly lose feeling (you feel numb) or have weakness in your face, arm, or leg.  Your face or eyelid hangs down to one side.  You suddenly feel confused.  You have trouble talking (aphasia) or understanding what people are saying.  You suddenly have trouble seeing in one or both eyes.  You suddenly have trouble walking.  You are dizzy.  You lose your balance or your movements are clumsy (uncoordinated).  You suddenly have a very bad headache and you do not know the cause.  You have new chest pain.  Your heart feels like it is fluttering or skipping a beat (irregular heartbeat). Do not wait to see if the symptoms above go away. Get help right away. Call your local emergency services (911 in [place] Do not drive yourself to the hospital. This information is not intended to replace advice given to you by your health care provider. Make sure you discuss any questions you have with your health care provider. Document Released: 01/02/2012 Document Revised: 12/09/2015 Document Reviewed: 01/03/2013 Elsevier Interactive Patient Education  [employer].
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
## 2018-05-10 ENCOUNTER — Telehealth: Payer: Self-pay

## 2018-05-10 ENCOUNTER — Ambulatory Visit (INDEPENDENT_AMBULATORY_CARE_PROVIDER_SITE_OTHER): Payer: PPO | Admitting: Internal Medicine

## 2018-05-10 ENCOUNTER — Encounter: Payer: Self-pay | Admitting: Internal Medicine

## 2018-05-10 VITALS — BP 168/80 | HR 72 | Temp 98.5°F | Resp 16 | Ht 65.0 in | Wt 116.8 lb

## 2018-05-10 DIAGNOSIS — Z23 Encounter for immunization: Secondary | ICD-10-CM | POA: Diagnosis not present

## 2018-05-10 DIAGNOSIS — M170 Bilateral primary osteoarthritis of knee: Secondary | ICD-10-CM | POA: Diagnosis not present

## 2018-05-10 DIAGNOSIS — R39198 Other difficulties with micturition: Secondary | ICD-10-CM | POA: Diagnosis not present

## 2018-05-10 DIAGNOSIS — I10 Essential (primary) hypertension: Secondary | ICD-10-CM

## 2018-05-10 DIAGNOSIS — E039 Hypothyroidism, unspecified: Secondary | ICD-10-CM | POA: Diagnosis not present

## 2018-05-10 DIAGNOSIS — M810 Age-related osteoporosis without current pathological fracture: Secondary | ICD-10-CM | POA: Diagnosis not present

## 2018-05-10 DIAGNOSIS — M720 Palmar fascial fibromatosis [Dupuytren]: Secondary | ICD-10-CM

## 2018-05-10 MED ORDER — LISINOPRIL 10 MG PO TABS: 10.0000 mg | ORAL_TABLET | Freq: Two times a day (BID) | ORAL | 5 refills | Status: DC

## 2018-05-10 MED ORDER — FLUTICASONE FUROATE-VILANTEROL 100-25 MCG/INH IN AEPB: 1.0000 | INHALATION_SPRAY | Freq: Every day | RESPIRATORY_TRACT | 5 refills | Status: DC

## 2018-05-10 NOTE — Telephone Encounter (Signed)
Patient's insurance will be verified for prolia ---I will call patient to discuss summary of benefits

## 2018-05-10 NOTE — Assessment & Plan Note (Signed)
Clinically euthyroid Check tsh  Titrate med dose if needed  

## 2018-05-10 NOTE — Telephone Encounter (Signed)
-----   Message from Binnie Rail, MD sent at 05/10/2018  1:33 PM EDT ----- She is interested in prolia - can you check price

## 2018-05-10 NOTE — Patient Instructions (Addendum)
Have blood work done next week  Tests ordered today. Your results will be released to [place] (or called to you) after review, usually within 72hours after test completion. If any changes need to be made, you will be notified at that same time.   Medications reviewed and updated.  Changes include :   Increase lisinopril to 10 mg twice daily.  Trying th inhaler - use once daily.    Monitor your BP at home - keep a log.    Your prescription(s) have been submitted to your pharmacy. Please take as directed and contact our office if you believe you are having problem(s) with the medication(s).  A referral was ordered for hand orthopedic  Please followup in December as scheduled.

## 2018-05-10 NOTE — Assessment & Plan Note (Signed)
Reviewed her bone density Discussed Fosamax versus Prolia There are some memory issues and I do worry about her compliance with the Fosamax She is interested in trying Prolia if it is covered by her insurance I will look into cost and let her know We will check vitamin D level, CMP, TSH

## 2018-05-10 NOTE — Assessment & Plan Note (Signed)
Blood pressure should only be done in right arm-left arm is falsely low Blood pressure not ideally controlled Increase lisinopril from 10 mg daily to 20 mg daily Encouraged her to monitor her blood pressure at home and keep a record CMP next week

## 2018-05-10 NOTE — Assessment & Plan Note (Signed)
Has contractures of both hands and there is some discomfort associated with it Will refer to orthopedics

## 2018-05-10 NOTE — Assessment & Plan Note (Signed)
Has had some difficulty urinating Encouraged her to increase her fluids Will check urinalysis, urine culture to rule out an infection

## 2018-05-10 NOTE — Assessment & Plan Note (Signed)
Saw orthopedics years ago Was told she has arthritis and has pain in her knees Does not want to have surgery and she is not a good candidate for surgery Has never had injections and would like to have injections Refer to sports medicine

## 2018-05-14 ENCOUNTER — Ambulatory Visit (INDEPENDENT_AMBULATORY_CARE_PROVIDER_SITE_OTHER): Payer: PPO | Admitting: Family Medicine

## 2018-05-14 ENCOUNTER — Ambulatory Visit (INDEPENDENT_AMBULATORY_CARE_PROVIDER_SITE_OTHER): Payer: PPO

## 2018-05-14 VITALS — BP 130/62 | HR 79

## 2018-05-14 DIAGNOSIS — M170 Bilateral primary osteoarthritis of knee: Secondary | ICD-10-CM | POA: Diagnosis not present
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
## 2018-05-14 NOTE — Assessment & Plan Note (Signed)
No significant effusion on exam.  Pain likely degenerative in nature. -Bilateral injection today. -Counseled on home exercise therapy and supportive care. -If no improvement can consider gel injections and updated imaging

## 2018-05-14 NOTE — Patient Instructions (Signed)
Nice to meet you  Take tylenol 650 mg three times a day is the best evidence based medicine we have for arthritis.  Glucosamine sulfate 750mg  twice a day is a supplement that has been shown to help moderate to severe arthritis. Vitamin D 2000 IU daily Fish oil 2 grams daily.  Tumeric 500mg  twice daily.  Capsaicin topically up to four times a day may also help with pain. Cortisone injections are an option if these interventions do not seem to make a difference or need more relief.  If cortisone injections do not help, there are different types of shots that may help but they take longer to take effect.  We can discuss this at follow up.  Please let me know if you would like more samples  Please see me back in 3-4 weeks if your pain hasn't improved.

## 2018-05-15 ENCOUNTER — Other Ambulatory Visit: Payer: Self-pay | Admitting: Internal Medicine

## 2018-05-15 NOTE — Telephone Encounter (Signed)
MD approved and sent electronically to pof../lmb  

## 2018-05-15 NOTE — Telephone Encounter (Signed)
Check  registry last filled 04/13/2018.Marland KitchenJohny Raymond

## 2018-05-22 ENCOUNTER — Ambulatory Visit (INDEPENDENT_AMBULATORY_CARE_PROVIDER_SITE_OTHER): Payer: PPO | Admitting: Orthopaedic Surgery

## 2018-05-22 ENCOUNTER — Encounter (INDEPENDENT_AMBULATORY_CARE_PROVIDER_SITE_OTHER): Payer: Self-pay | Admitting: Orthopaedic Surgery

## 2018-05-22 DIAGNOSIS — M720 Palmar fascial fibromatosis [Dupuytren]: Secondary | ICD-10-CM | POA: Diagnosis not present

## 2018-05-22 DIAGNOSIS — G5622 Lesion of ulnar nerve, left upper limb: Secondary | ICD-10-CM
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
## 2018-05-30 ENCOUNTER — Other Ambulatory Visit (INDEPENDENT_AMBULATORY_CARE_PROVIDER_SITE_OTHER): Payer: PPO

## 2018-05-30 DIAGNOSIS — I10 Essential (primary) hypertension: Secondary | ICD-10-CM

## 2018-05-30 DIAGNOSIS — M810 Age-related osteoporosis without current pathological fracture: Secondary | ICD-10-CM

## 2018-05-30 DIAGNOSIS — R39198 Other difficulties with micturition: Secondary | ICD-10-CM | POA: Diagnosis not present

## 2018-05-30 DIAGNOSIS — E039 Hypothyroidism, unspecified: Secondary | ICD-10-CM | POA: Diagnosis not present

## 2018-05-30 LAB — COMPREHENSIVE METABOLIC PANEL
ALT: 13 U/L (ref 0–35)
AST: 21 U/L (ref 0–37)
Albumin: 4.1 g/dL (ref 3.5–5.2)
Alkaline Phosphatase: 60 U/L (ref 39–117)
BUN: 19 mg/dL (ref 6–23)
CO2: 30 mEq/L (ref 19–32)
Calcium: 9.3 mg/dL (ref 8.4–10.5)
Chloride: 104 mEq/L (ref 96–112)
Creatinine, Ser: 1.28 mg/dL — ABNORMAL HIGH (ref 0.40–1.20)
GFR: 42.34 mL/min — ABNORMAL LOW (ref >60.00)
Glucose, Bld: 95 mg/dL (ref 70–99)
Potassium: 5.7 mEq/L — ABNORMAL HIGH (ref 3.5–5.1)
Sodium: 142 mEq/L (ref 135–145)
Total Bilirubin: 0.4 mg/dL (ref 0.2–1.2)
Total Protein: 6.8 g/dL (ref 6.0–8.3)

## 2018-05-30 LAB — TSH: TSH: 2.35 u[IU]/mL (ref 0.35–4.50)

## 2018-05-30 LAB — URINALYSIS, ROUTINE W REFLEX MICROSCOPIC
Bilirubin Urine: NEGATIVE
Hgb urine dipstick: NEGATIVE
Ketones, ur: NEGATIVE
Nitrite: NEGATIVE
Specific Gravity, Urine: 1.01 (ref 1.000–1.030)
Total Protein, Urine: NEGATIVE
Urine Glucose: NEGATIVE
Urobilinogen, UA: 0.2 (ref 0.0–1.0)
pH: 6 (ref 5.0–8.0)

## 2018-05-30 LAB — VITAMIN D 25 HYDROXY (VIT D DEFICIENCY, FRACTURES): VITD: 44.56 ng/mL (ref 30.00–100.00)

## 2018-05-31 ENCOUNTER — Telehealth (INDEPENDENT_AMBULATORY_CARE_PROVIDER_SITE_OTHER): Payer: Self-pay | Admitting: Orthopaedic Surgery

## 2018-05-31 LAB — URINE CULTURE
MICRO NUMBER:: 91372828
Result:: NO GROWTH
SPECIMEN QUALITY:: ADEQUATE

## 2018-06-03 ENCOUNTER — Other Ambulatory Visit: Payer: Self-pay | Admitting: Emergency Medicine

## 2018-06-03 MED ORDER — AMLODIPINE BESYLATE 5 MG PO TABS: 5.0000 mg | ORAL_TABLET | Freq: Every day | ORAL | 1 refills | Status: DC

## 2018-06-03 NOTE — Telephone Encounter (Signed)
See message below °

## 2018-06-03 NOTE — Telephone Encounter (Signed)
Ok I would call them to confirm that they want the nerve study

## 2018-06-04 NOTE — Telephone Encounter (Signed)
Called [place] back. States patient changed her mind. States she is in a lot of pain and would like to proceed with NCS/EMG. Please call to schedule study. Thank you.

## 2018-06-04 NOTE — Telephone Encounter (Signed)
Scheduled for 11/22 at 0900.

## 2018-06-04 NOTE — Telephone Encounter (Signed)
Left message for patient's daughter to call back to schedule.

## 2018-06-07 ENCOUNTER — Ambulatory Visit (INDEPENDENT_AMBULATORY_CARE_PROVIDER_SITE_OTHER): Payer: PPO | Admitting: Physical Medicine and Rehabilitation

## 2018-06-07 ENCOUNTER — Encounter (INDEPENDENT_AMBULATORY_CARE_PROVIDER_SITE_OTHER): Payer: Self-pay | Admitting: Physical Medicine and Rehabilitation

## 2018-06-07 DIAGNOSIS — R202 Paresthesia of skin: Secondary | ICD-10-CM | POA: Diagnosis not present

## 2018-06-07 NOTE — Progress Notes (Signed)
.  Numeric Pain Rating Scale and Functional Assessment Average Pain 8   In the last MONTH (on 0-10 scale) has pain interfered with the following?  1. General activity like being  able to carry out your everyday physical activities such as walking, climbing stairs, carrying groceries, or moving a chair?  Rating(5)

## 2018-06-11 ENCOUNTER — Ambulatory Visit (INDEPENDENT_AMBULATORY_CARE_PROVIDER_SITE_OTHER): Payer: Self-pay | Admitting: Orthopaedic Surgery

## 2018-06-18 NOTE — Procedures (Signed)
EMG & NCV Findings: Evaluation of the left ulnar motor nerve showed prolonged distal onset latency (4.3 ms) and decreased conduction velocity (A Elbow-B Elbow, 22 m/s).  The left median (across palm) sensory nerve showed prolonged distal peak latency (Wrist, 3.9 ms) and prolonged distal peak latency (Palm, 2.2 ms).  The left ulnar sensory nerve showed no response (Wrist).  All remaining nerves (as indicated in the following tables) were within normal limits.    Needle evaluation of the left first dorsal interosseous muscle showed increased insertional activity and diminished recruitment.  All remaining muscles (as indicated in the following table) showed no evidence of electrical instability.    Impression: The above electrodiagnostic study is ABNORMAL and reveals evidence of a moderate to severe left ulnar nerve entrapment at the elbow (cubital tunnel syndrome) affecting sensory and motor components.   There is no significant electrodiagnostic evidence of any other focal nerve entrapment, brachial plexopathy or cervical radiculopathy  Recommendations: 1.  Follow-up with referring physician. 2.  Continue current management of symptoms. 3.  Suggest surgical evaluation.  ___________________________ Laurence Spates FAAPMR Board Certified, American Board of Physical Medicine and Rehabilitation    Nerve Conduction Studies Anti Sensory Summary Table   Stim Site NR Peak (ms) Norm Peak (ms) P-T Amp (V) Norm P-T Amp Site1 Site2 Delta-P (ms) Dist (cm) Vel (m/s) Norm Vel (m/s)  Left Median Acr Palm Anti Sensory (2nd Digit)  31.7C  Wrist    *3.9 <3.6 12.6 >10 Wrist Palm 1.7 0.0    Palm    *2.2 <2.0 8.5         Left Radial Anti Sensory (Base 1st Digit)  30.8C  Wrist    2.5 <3.1 9.0  Wrist Base 1st Digit 2.5 0.0        2.5  9.0         Left Ulnar Anti Sensory (5th Digit)  31.5C  Wrist *NR  <3.7  >15.0 Wrist 5th Digit  14.0  >38   Motor Summary Table   Stim Site NR Onset (ms) Norm Onset (ms) O-P  Amp (mV) Norm O-P Amp Site1 Site2 Delta-0 (ms) Dist (cm) Vel (m/s) Norm Vel (m/s)  Left Median Motor (Abd Poll Brev)  30.9C  Wrist    4.0 <4.2 5.0 >5 Elbow Wrist 3.8 20.8 55 >50  Elbow    7.8  4.5         Left Ulnar Motor (Abd Dig Min)  30.9C  Wrist    *4.3 <4.2 8.6 >3 B Elbow Wrist 3.7 20.0 54 >53  B Elbow    8.0  5.1  A Elbow B Elbow 4.6 10.0 *22 >53  A Elbow    12.6  3.9          EMG   Side Muscle Nerve Root Ins Act Fibs Psw Amp Dur Poly Recrt Int Fraser Din Comment  Left Abd Poll Brev Median C8-T1 Nml Nml Nml Nml Nml 0 Nml Nml   Left 1stDorInt Ulnar C8-T1 *Incr Nml Nml Nml Nml 0 *Reduced Nml   Left PronatorTeres Median C6-7 Nml Nml Nml Nml Nml 0 Nml Nml   Left Biceps Musculocut C5-6 Nml Nml Nml Nml Nml 0 Nml Nml   Left FlexCarpiUln Ulnar C8,T1 Nml Nml Nml Nml Nml 0 Nml Nml   Left FlexDigProf Ulnar C8,T1 Nml Nml Nml Nml Nml 0 Nml Nml     Nerve Conduction Studies Anti Sensory Left/Right Comparison   Stim Site L Lat (ms) R Lat (ms) L-R Lat (ms) L  Amp (V) R Amp (V) L-R Amp (%) Site1 Site2 L Vel (m/s) R Vel (m/s) L-R Vel (m/s)  Median Acr Palm Anti Sensory (2nd Digit)  31.7C  Wrist *3.9   12.6   Wrist Palm     Palm *2.2   8.5         Radial Anti Sensory (Base 1st Digit)  30.8C  Wrist 2.5   9.0   Wrist Base 1st Digit      2.5   9.0         Ulnar Anti Sensory (5th Digit)  31.5C  Wrist       Wrist 5th Digit      Motor Left/Right Comparison   Stim Site L Lat (ms) R Lat (ms) L-R Lat (ms) L Amp (mV) R Amp (mV) L-R Amp (%) Site1 Site2 L Vel (m/s) R Vel (m/s) L-R Vel (m/s)  Median Motor (Abd Poll Brev)  30.9C  Wrist 4.0   5.0   Elbow Wrist 55    Elbow 7.8   4.5         Ulnar Motor (Abd Dig Min)  30.9C  Wrist *4.3   8.6   B Elbow Wrist 54    B Elbow 8.0   5.1   A Elbow B Elbow *22    A Elbow 12.6   3.9            Waveforms:
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
## 2018-06-19 ENCOUNTER — Encounter (INDEPENDENT_AMBULATORY_CARE_PROVIDER_SITE_OTHER): Payer: Self-pay | Admitting: Orthopaedic Surgery

## 2018-06-19 ENCOUNTER — Ambulatory Visit (INDEPENDENT_AMBULATORY_CARE_PROVIDER_SITE_OTHER): Payer: PPO | Admitting: Orthopaedic Surgery

## 2018-06-19 DIAGNOSIS — G5622 Lesion of ulnar nerve, left upper limb: Secondary | ICD-10-CM | POA: Diagnosis not present

## 2018-06-19 MED ORDER — LISINOPRIL 10 MG PO TABS: 10.0000 mg | ORAL_TABLET | Freq: Every day | ORAL | 5 refills | Status: DC
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
## 2018-06-20 ENCOUNTER — Encounter: Payer: PPO | Admitting: Internal Medicine

## 2018-06-23 ENCOUNTER — Other Ambulatory Visit: Payer: Self-pay | Admitting: Internal Medicine

## 2018-06-24 ENCOUNTER — Other Ambulatory Visit: Payer: Self-pay

## 2018-06-24 MED ORDER — PRAVASTATIN SODIUM 40 MG PO TABS: 40.0000 mg | ORAL_TABLET | Freq: Every day | ORAL | 2 refills | Status: DC

## 2018-06-24 NOTE — Telephone Encounter (Signed)
Reviewed chart pt is up-to-date sent refills to [facility].../lmb  
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
## 2018-06-24 NOTE — Patient Instructions (Addendum)
  Tests ordered today. Your results will be released to MyChart (or called to you) after review, usually within 72hours after test completion. If any changes need to be made, you will be notified at that same time.  Medications reviewed and updated.  Changes include :   none      Please followup in 6 months   

## 2018-06-24 NOTE — Telephone Encounter (Signed)
Pt calling to check status. Please advise [phone]

## 2018-06-25 ENCOUNTER — Ambulatory Visit (INDEPENDENT_AMBULATORY_CARE_PROVIDER_SITE_OTHER): Payer: PPO | Admitting: Internal Medicine

## 2018-06-25 ENCOUNTER — Encounter: Payer: Self-pay | Admitting: Internal Medicine

## 2018-06-25 ENCOUNTER — Other Ambulatory Visit (INDEPENDENT_AMBULATORY_CARE_PROVIDER_SITE_OTHER): Payer: PPO

## 2018-06-25 VITALS — BP 142/64 | HR 65 | Temp 98.8°F | Resp 16 | Ht 65.0 in | Wt 119.0 lb

## 2018-06-25 DIAGNOSIS — M545 Low back pain, unspecified: Secondary | ICD-10-CM | POA: Insufficient documentation

## 2018-06-25 DIAGNOSIS — J439 Emphysema, unspecified: Secondary | ICD-10-CM

## 2018-06-25 DIAGNOSIS — M810 Age-related osteoporosis without current pathological fracture: Secondary | ICD-10-CM | POA: Diagnosis not present

## 2018-06-25 DIAGNOSIS — I10 Essential (primary) hypertension: Secondary | ICD-10-CM | POA: Diagnosis not present

## 2018-06-25 DIAGNOSIS — G8929 Other chronic pain: Secondary | ICD-10-CM | POA: Diagnosis not present

## 2018-06-25 LAB — COMPREHENSIVE METABOLIC PANEL
ALT: 14 U/L (ref 0–35)
AST: 25 U/L (ref 0–37)
Albumin: 4 g/dL (ref 3.5–5.2)
Alkaline Phosphatase: 57 U/L (ref 39–117)
BUN: 22 mg/dL (ref 6–23)
CO2: 32 mEq/L (ref 19–32)
Calcium: 8.8 mg/dL (ref 8.4–10.5)
Chloride: 104 mEq/L (ref 96–112)
Creatinine, Ser: 0.87 mg/dL (ref 0.40–1.20)
GFR: 66.1 mL/min (ref >60.00)
Glucose, Bld: 96 mg/dL (ref 70–99)
Potassium: 4.8 mEq/L (ref 3.5–5.1)
Sodium: 141 mEq/L (ref 135–145)
Total Bilirubin: 0.4 mg/dL (ref 0.2–1.2)
Total Protein: 6.5 g/dL (ref 6.0–8.3)

## 2018-06-25 NOTE — Assessment & Plan Note (Signed)
Blood pressure is well controlled-her daughter will confirm what doses of medication she is on-we will continue which she is on as long as her kidney function is normal CMP today

## 2018-06-25 NOTE — Assessment & Plan Note (Signed)
She states bilateral hip and sometimes lower back pain, especially with certain activities Discussed with her that most likely this is all related to her lower back and not her hips Certainly activities flareup such as sweeping Improved with rest At this point it is rarely severe and she does not feel it needs to be evaluated further

## 2018-06-25 NOTE — Assessment & Plan Note (Signed)
Shortness of breath, wheezing and some for cough improved with using Breo inhaler daily Continue Stressed smoking cessation Discussed rinsing her mouth out with water after using the inhaler, which she has not been doing

## 2018-06-25 NOTE — Assessment & Plan Note (Signed)
Taking calcium and vitamin D Not currently exercising Discussed medication options including Prolia, Fosamax and Reclast-Prolia is too expensive this year, but she may be able to apply for assistance next year and that would be her first choice Can consider Reclast as well.  Would like to avoid another pill because she takes so many already Fall precautions

## 2018-06-27 NOTE — Progress Notes (Signed)
I spoke with anesthesiology about this patient and her extensive cardiac history and both Dr. Marcie Bal and Dr. Rockwell Alexandria are in agreeance that this pt needs cardiac clearance before proceeding with her planned procedure here at [facility].

## 2018-06-27 NOTE — Progress Notes (Signed)
I have called both the patients daughter [name] informing her that I have called Dr. Phoebe Sharps office requesting cardiac clearance. The daughter stated that she has not seen a cardiologist is "a few years." I left a voice message with Samella Parr the surgery scheduler for Dr. Erlinda Hong requesting for cardiac clearance for her surgery scheduled for 07/03/18. Will leave note for Tammy our PAT nurse.

## 2018-06-28 ENCOUNTER — Telehealth (INDEPENDENT_AMBULATORY_CARE_PROVIDER_SITE_OTHER): Payer: Self-pay | Admitting: Orthopaedic Surgery

## 2018-06-29 ENCOUNTER — Other Ambulatory Visit: Payer: Self-pay | Admitting: Neurology

## 2018-07-03 ENCOUNTER — Encounter (HOSPITAL_BASED_OUTPATIENT_CLINIC_OR_DEPARTMENT_OTHER): Admission: RE | Payer: Self-pay | Source: Home / Self Care

## 2018-07-03 ENCOUNTER — Ambulatory Visit (HOSPITAL_BASED_OUTPATIENT_CLINIC_OR_DEPARTMENT_OTHER): Admission: RE | Admit: 2018-07-03 | Payer: PPO | Source: Home / Self Care | Admitting: Orthopaedic Surgery

## 2018-07-03 SURGERY — RELEASE, CUBITAL TUNNEL
Anesthesia: Regional | Laterality: Left

## 2018-07-11 ENCOUNTER — Other Ambulatory Visit: Payer: Self-pay | Admitting: Internal Medicine

## 2018-07-12 ENCOUNTER — Inpatient Hospital Stay (INDEPENDENT_AMBULATORY_CARE_PROVIDER_SITE_OTHER): Payer: PPO | Admitting: Orthopaedic Surgery

## 2018-07-16 ENCOUNTER — Inpatient Hospital Stay (INDEPENDENT_AMBULATORY_CARE_PROVIDER_SITE_OTHER): Payer: PPO | Admitting: Orthopaedic Surgery

## 2018-07-19 ENCOUNTER — Encounter: Payer: Self-pay | Admitting: Cardiovascular Disease

## 2018-07-19 ENCOUNTER — Ambulatory Visit (INDEPENDENT_AMBULATORY_CARE_PROVIDER_SITE_OTHER): Payer: PPO | Admitting: Cardiovascular Disease

## 2018-07-19 ENCOUNTER — Telehealth: Payer: Self-pay

## 2018-07-19 DIAGNOSIS — E782 Mixed hyperlipidemia: Secondary | ICD-10-CM | POA: Diagnosis not present

## 2018-07-19 DIAGNOSIS — I6521 Occlusion and stenosis of right carotid artery: Secondary | ICD-10-CM | POA: Diagnosis not present

## 2018-07-19 DIAGNOSIS — Z01818 Encounter for other preprocedural examination: Secondary | ICD-10-CM

## 2018-07-19 DIAGNOSIS — I2510 Atherosclerotic heart disease of native coronary artery without angina pectoris: Secondary | ICD-10-CM

## 2018-07-19 DIAGNOSIS — I10 Essential (primary) hypertension: Secondary | ICD-10-CM | POA: Diagnosis not present

## 2018-07-19 DIAGNOSIS — F17200 Nicotine dependence, unspecified, uncomplicated: Secondary | ICD-10-CM

## 2018-07-19 DIAGNOSIS — K551 Chronic vascular disorders of intestine: Secondary | ICD-10-CM | POA: Diagnosis not present

## 2018-07-19 NOTE — Patient Instructions (Signed)
Medication Instructions:  NONE If you need a refill on your cardiac medications before your next appointment, please call your pharmacy.   Lab work: NONE If you have labs (blood work) drawn today and your tests are completely normal, you will receive your results only by: Marland Kitchen MyChart Message (if you have MyChart) OR . A paper copy in the mail If you have any lab test that is abnormal or we need to change your treatment, we will call you to review the results.  Testing/Procedures: NONE  Follow-Up: At [facility], you and your health needs are our priority.  As part of our continuing mission to provide you with exceptional heart care, we have created designated Provider Care Teams.  These Care Teams include your primary Cardiologist (physician) and Advanced Practice Providers (APPs -  Physician Assistants and Nurse Practitioners) who all work together to provide you with the care you need, when you need it. You will need a follow up appointment in 12 months.  Please call our office 2 months in advance to schedule this appointment.  You may see DR. BERRY or one of the following Advanced Practice Providers on your designated Care Team:   Kerin Ransom, PA-C [place], [place] . Sande Rives, PA-C  Any Other Special Instructions Will Be Listed Below (If Applicable). YOUR CARDIAC CLEARANCE HAS BEEN COMPLETED.

## 2018-07-19 NOTE — Assessment & Plan Note (Addendum)
History of ongoing tobacco abuse of 3 cigarette cigarettes per day recalcitrant risk factor modification.  She has smoked for 65 years.

## 2018-07-19 NOTE — Telephone Encounter (Signed)
LMTCB

## 2018-07-19 NOTE — Assessment & Plan Note (Signed)
History of CAD status post inferior wall myocardial infarction 1993.  She had a Myoview performed 12/10/2014 that showed anteroapical scar without ischemia.  She denies chest pain.

## 2018-07-19 NOTE — Assessment & Plan Note (Signed)
History of essential hypertension her blood pressure measured today at 145/75.  She is on amlodipine .

## 2018-07-19 NOTE — Assessment & Plan Note (Signed)
History of hyperlipidemia on statin therapy with lipid profile performed 12/13/2017 revealing total cholesterol 200, LDL of 83 and HDL of 92.

## 2018-07-19 NOTE — Assessment & Plan Note (Signed)
History of right carotid endarterectomy 18 2017 followed by Dr. Trula Slade.  She does have a right carotid bruit on exam.

## 2018-07-19 NOTE — Assessment & Plan Note (Signed)
History of mesenteric stenosis status post stenting by Dr. Trula Slade in the past.
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
## 2018-07-19 NOTE — Assessment & Plan Note (Signed)
History of carpal tunnel syndrome on the left scheduled for upcoming surgery.  She had a negative Myoview 4 years ago and has had no chest pain.  This is a fairly low risk procedure.  I do not think she needs a functional study and will clear her at low perioperative cardiovascular risk.

## 2018-08-08 ENCOUNTER — Other Ambulatory Visit (INDEPENDENT_AMBULATORY_CARE_PROVIDER_SITE_OTHER): Payer: Self-pay | Admitting: Physician Assistant

## 2018-08-08 DIAGNOSIS — G5622 Lesion of ulnar nerve, left upper limb: Secondary | ICD-10-CM | POA: Diagnosis not present

## 2018-08-08 DIAGNOSIS — G8918 Other acute postprocedural pain: Secondary | ICD-10-CM | POA: Diagnosis not present

## 2018-08-08 MED ORDER — HYDROCODONE-ACETAMINOPHEN 5-325 MG PO TABS: 1.0000 | ORAL_TABLET | Freq: Four times a day (QID) | ORAL | 0 refills | Status: DC | PRN

## 2018-08-09 ENCOUNTER — Telehealth (INDEPENDENT_AMBULATORY_CARE_PROVIDER_SITE_OTHER): Payer: Self-pay | Admitting: Orthopaedic Surgery

## 2018-08-09 NOTE — Telephone Encounter (Signed)
Patient's daughter [name]) called advised the pharmacy told her they needed a auth code for the Hydrocodone because patient is also on Klonopin. The pharmacy think patient may have a reaction if she take the Hydrocodone and Klonopin. [name] said the pharmacy faxed over a form yesterday.  [name] said her mother is in a lot of pain. Patient had surgery yesterday.   The number to contact [name] is [phone]

## 2018-08-10 ENCOUNTER — Other Ambulatory Visit: Payer: Self-pay | Admitting: Family

## 2018-08-12 ENCOUNTER — Telehealth: Payer: Self-pay | Admitting: *Deleted

## 2018-08-12 ENCOUNTER — Ambulatory Visit: Payer: PPO | Admitting: Adult Health

## 2018-08-12 NOTE — Telephone Encounter (Signed)
Last refill was 07/11/18 Last OV 06/25/18 Next OV 12/26/18

## 2018-08-12 NOTE — Telephone Encounter (Signed)
Called to see what the Pharm exactly needed since we have not yet received PA form. They state ins will only pay for #28 tablets and not #30. If they want to get all #30 tabs then it will require PA. Advised Pharmacist ok to fill #28(what ins will pay). Called daughter to let her know. Also advised her if they needed Rf on Klonopin she would have to ask the Dr who fills Rx to Refill Rx. She is aware and she will call me back if they have any other issues.   Apologized since I was unable to Call her back on Friday since I left early.

## 2018-08-12 NOTE — Telephone Encounter (Signed)
Spoke with [name]. Pt. has an appt. with Jinny Blossom this am, but Jinny Blossom is out of the office due to illness.  Appt. r/s with Dr. Jannifer Franklin, for 08/30/18, arrival time of 1130 for a 1200 appt./fim

## 2018-08-14 ENCOUNTER — Encounter (INDEPENDENT_AMBULATORY_CARE_PROVIDER_SITE_OTHER): Payer: Self-pay | Admitting: Orthopaedic Surgery

## 2018-08-14 ENCOUNTER — Ambulatory Visit (INDEPENDENT_AMBULATORY_CARE_PROVIDER_SITE_OTHER): Payer: PPO | Admitting: Orthopaedic Surgery

## 2018-08-14 VITALS — Ht 65.0 in | Wt 119.8 lb

## 2018-08-14 DIAGNOSIS — G5622 Lesion of ulnar nerve, left upper limb: Secondary | ICD-10-CM
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
## 2018-08-22 ENCOUNTER — Encounter (INDEPENDENT_AMBULATORY_CARE_PROVIDER_SITE_OTHER): Payer: Self-pay | Admitting: Orthopaedic Surgery

## 2018-08-22 ENCOUNTER — Ambulatory Visit (INDEPENDENT_AMBULATORY_CARE_PROVIDER_SITE_OTHER): Payer: PPO | Admitting: Physician Assistant

## 2018-08-22 DIAGNOSIS — G5622 Lesion of ulnar nerve, left upper limb: Secondary | ICD-10-CM
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
## 2018-08-26 ENCOUNTER — Telehealth: Payer: Self-pay | Admitting: Internal Medicine

## 2018-08-26 NOTE — Telephone Encounter (Signed)
Copied from [place] [id number] [phone]. Topic: Quick Communication - See Telephone Encounter >> Aug 26, 2018 12:47 PM Bea Graff, NT wrote: CRM for notification. See Telephone encounter for: 08/26/18. Pts daughter calling and states her mom received a grant for the prolia injection and she needs and rx so that they may pick this up at the pharamacy.

## 2018-08-26 NOTE — Telephone Encounter (Signed)
I do not have any information on this patient. Can you help with this?

## 2018-08-27 MED ORDER — DENOSUMAB 60 MG/ML ~~LOC~~ SOSY: 60.0000 mg | PREFILLED_SYRINGE | Freq: Once | SUBCUTANEOUS | 0 refills | Status: AC

## 2018-08-27 NOTE — Telephone Encounter (Signed)
I have talked with Brandi Raymond/patient's daughter---she has received [employer] paperwork in mail---I am [facility]----she will take healthwell info to local pharm to pick up rx and will bring rx to me to administer to patient---she has been advised to keep prolia injection cold/refrigerated and will need to schedule appt with nurse to get injection---can talk with Chalet Kerwin,RN at [place] office if any further questions

## 2018-08-27 NOTE — Telephone Encounter (Signed)
Pt's daughter [name] called again to inquire about rx for prolia injection for her mother. She would like to speak with someone about the next step being that a grant is in place to cover the injection. Please reach out to daughter [phone]

## 2018-08-28 ENCOUNTER — Other Ambulatory Visit: Payer: Self-pay | Admitting: Internal Medicine

## 2018-08-28 NOTE — Telephone Encounter (Signed)
Samantha with [facility] calling regarding Prolia- States that pharmacy needs to know ICP 10 codes for medication.    Samantha with [facility]# [phone]

## 2018-08-29 NOTE — Telephone Encounter (Signed)
I have talked with specialty pharmacy and given needed codes

## 2018-08-30 ENCOUNTER — Encounter: Payer: Self-pay | Admitting: Neurology

## 2018-08-30 ENCOUNTER — Ambulatory Visit (INDEPENDENT_AMBULATORY_CARE_PROVIDER_SITE_OTHER): Payer: PPO | Admitting: Neurology

## 2018-08-30 VITALS — BP 118/60 | HR 63 | Ht 65.0 in | Wt 118.3 lb

## 2018-08-30 DIAGNOSIS — R413 Other amnesia: Secondary | ICD-10-CM | POA: Diagnosis not present

## 2018-08-30 DIAGNOSIS — G609 Hereditary and idiopathic neuropathy, unspecified: Secondary | ICD-10-CM

## 2018-08-30 DIAGNOSIS — G7000 Myasthenia gravis without (acute) exacerbation: Secondary | ICD-10-CM | POA: Diagnosis not present

## 2018-08-30 MED ORDER — DULOXETINE HCL 60 MG PO CPEP: 60.0000 mg | ORAL_CAPSULE | Freq: Every day | ORAL | 3 refills | Status: DC
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
## 2018-08-30 NOTE — Patient Instructions (Signed)
Stop the Zoloft and start Cymbalta 60 mg a day.   Cymbalta (duloxetine) is an antidepressant medication that is commonly used for peripheral neuropathy pain or for fibromyalgia pain. As with any antidepressant medication, worsening depression can be seen. This medication can potentially cause headache, dizziness, sexual dysfunction, or nausea. If any problems are noted on this medication, please contact our office.

## 2018-09-01 ENCOUNTER — Other Ambulatory Visit: Payer: Self-pay | Admitting: Internal Medicine

## 2018-09-03 ENCOUNTER — Emergency Department (HOSPITAL_BASED_OUTPATIENT_CLINIC_OR_DEPARTMENT_OTHER): Payer: PPO

## 2018-09-03 ENCOUNTER — Other Ambulatory Visit: Payer: Self-pay

## 2018-09-03 ENCOUNTER — Encounter (HOSPITAL_BASED_OUTPATIENT_CLINIC_OR_DEPARTMENT_OTHER): Payer: Self-pay

## 2018-09-03 ENCOUNTER — Inpatient Hospital Stay (HOSPITAL_BASED_OUTPATIENT_CLINIC_OR_DEPARTMENT_OTHER)
Admission: EM | Admit: 2018-09-03 | Discharge: 2018-09-06 | DRG: 189 | Disposition: A | Discharge status: Home Health | Payer: PPO | Attending: Student | Admitting: Student

## 2018-09-03 DIAGNOSIS — Z833 Family history of diabetes mellitus: Secondary | ICD-10-CM

## 2018-09-03 DIAGNOSIS — K219 Gastro-esophageal reflux disease without esophagitis: Secondary | ICD-10-CM | POA: Diagnosis present

## 2018-09-03 DIAGNOSIS — I708 Atherosclerosis of other arteries: Secondary | ICD-10-CM | POA: Diagnosis present

## 2018-09-03 DIAGNOSIS — Y92009 Unspecified place in unspecified non-institutional (private) residence as the place of occurrence of the external cause: Secondary | ICD-10-CM

## 2018-09-03 DIAGNOSIS — I2510 Atherosclerotic heart disease of native coronary artery without angina pectoris: Secondary | ICD-10-CM | POA: Diagnosis present

## 2018-09-03 DIAGNOSIS — Z79890 Hormone replacement therapy (postmenopausal): Secondary | ICD-10-CM

## 2018-09-03 DIAGNOSIS — Z79891 Long term (current) use of opiate analgesic: Secondary | ICD-10-CM

## 2018-09-03 DIAGNOSIS — E785 Hyperlipidemia, unspecified: Secondary | ICD-10-CM | POA: Diagnosis not present

## 2018-09-03 DIAGNOSIS — J9601 Acute respiratory failure with hypoxia: Secondary | ICD-10-CM | POA: Diagnosis not present

## 2018-09-03 DIAGNOSIS — Z86010 Personal history of colonic polyps: Secondary | ICD-10-CM

## 2018-09-03 DIAGNOSIS — I739 Peripheral vascular disease, unspecified: Secondary | ICD-10-CM | POA: Diagnosis present

## 2018-09-03 DIAGNOSIS — Z7902 Long term (current) use of antithrombotics/antiplatelets: Secondary | ICD-10-CM

## 2018-09-03 DIAGNOSIS — F0390 Unspecified dementia without behavioral disturbance: Secondary | ICD-10-CM | POA: Diagnosis present

## 2018-09-03 DIAGNOSIS — R918 Other nonspecific abnormal finding of lung field: Secondary | ICD-10-CM

## 2018-09-03 DIAGNOSIS — Z800 Family history of malignant neoplasm of digestive organs: Secondary | ICD-10-CM | POA: Diagnosis not present

## 2018-09-03 DIAGNOSIS — J441 Chronic obstructive pulmonary disease with (acute) exacerbation: Secondary | ICD-10-CM | POA: Diagnosis not present

## 2018-09-03 DIAGNOSIS — Z8249 Family history of ischemic heart disease and other diseases of the circulatory system: Secondary | ICD-10-CM | POA: Diagnosis not present

## 2018-09-03 DIAGNOSIS — M8588 Other specified disorders of bone density and structure, other site: Secondary | ICD-10-CM | POA: Diagnosis not present

## 2018-09-03 DIAGNOSIS — R911 Solitary pulmonary nodule: Secondary | ICD-10-CM | POA: Diagnosis present

## 2018-09-03 DIAGNOSIS — S3992XA Unspecified injury of lower back, initial encounter: Secondary | ICD-10-CM | POA: Diagnosis not present

## 2018-09-03 DIAGNOSIS — Z882 Allergy status to sulfonamides status: Secondary | ICD-10-CM | POA: Diagnosis not present

## 2018-09-03 DIAGNOSIS — M4856XA Collapsed vertebra, not elsewhere classified, lumbar region, initial encounter for fracture: Secondary | ICD-10-CM | POA: Diagnosis present

## 2018-09-03 DIAGNOSIS — I252 Old myocardial infarction: Secondary | ICD-10-CM

## 2018-09-03 DIAGNOSIS — W19XXXA Unspecified fall, initial encounter: Secondary | ICD-10-CM

## 2018-09-03 DIAGNOSIS — F329 Major depressive disorder, single episode, unspecified: Secondary | ICD-10-CM | POA: Diagnosis not present

## 2018-09-03 DIAGNOSIS — Z825 Family history of asthma and other chronic lower respiratory diseases: Secondary | ICD-10-CM

## 2018-09-03 DIAGNOSIS — S299XXA Unspecified injury of thorax, initial encounter: Secondary | ICD-10-CM | POA: Diagnosis not present

## 2018-09-03 DIAGNOSIS — G7000 Myasthenia gravis without (acute) exacerbation: Secondary | ICD-10-CM | POA: Diagnosis not present

## 2018-09-03 DIAGNOSIS — F39 Unspecified mood [affective] disorder: Secondary | ICD-10-CM | POA: Diagnosis not present

## 2018-09-03 DIAGNOSIS — I10 Essential (primary) hypertension: Secondary | ICD-10-CM | POA: Diagnosis not present

## 2018-09-03 DIAGNOSIS — Z7982 Long term (current) use of aspirin: Secondary | ICD-10-CM

## 2018-09-03 DIAGNOSIS — S32010A Wedge compression fracture of first lumbar vertebra, initial encounter for closed fracture: Secondary | ICD-10-CM

## 2018-09-03 DIAGNOSIS — E039 Hypothyroidism, unspecified: Secondary | ICD-10-CM | POA: Diagnosis not present

## 2018-09-03 DIAGNOSIS — Z79899 Other long term (current) drug therapy: Secondary | ICD-10-CM | POA: Diagnosis not present

## 2018-09-03 DIAGNOSIS — M25551 Pain in right hip: Secondary | ICD-10-CM | POA: Diagnosis not present

## 2018-09-03 DIAGNOSIS — M545 Low back pain: Secondary | ICD-10-CM | POA: Diagnosis not present

## 2018-09-03 DIAGNOSIS — W1830XA Fall on same level, unspecified, initial encounter: Secondary | ICD-10-CM | POA: Diagnosis present

## 2018-09-03 DIAGNOSIS — R0602 Shortness of breath: Secondary | ICD-10-CM | POA: Diagnosis not present

## 2018-09-03 DIAGNOSIS — Z808 Family history of malignant neoplasm of other organs or systems: Secondary | ICD-10-CM | POA: Diagnosis not present

## 2018-09-03 DIAGNOSIS — S3993XA Unspecified injury of pelvis, initial encounter: Secondary | ICD-10-CM | POA: Diagnosis not present

## 2018-09-03 DIAGNOSIS — S3991XA Unspecified injury of abdomen, initial encounter: Secondary | ICD-10-CM | POA: Diagnosis not present

## 2018-09-03 LAB — COMPREHENSIVE METABOLIC PANEL
ALT: 20 U/L (ref 0–44)
AST: 30 U/L (ref 15–41)
Albumin: 3.6 g/dL (ref 3.5–5.0)
Alkaline Phosphatase: 66 U/L (ref 38–126)
Anion gap: 6 (ref 5–15)
BUN: 17 mg/dL (ref 8–23)
CO2: 30 mmol/L (ref 22–32)
Calcium: 8.8 mg/dL — ABNORMAL LOW (ref 8.9–10.3)
Chloride: 100 mmol/L (ref 98–111)
Creatinine, Ser: 0.83 mg/dL (ref 0.44–1.00)
GFR calc Af Amer: >60 mL/min (ref >60)
GFR calc non Af Amer: >60 mL/min (ref >60)
Glucose, Bld: 109 mg/dL — ABNORMAL HIGH (ref 70–99)
Potassium: 3.9 mmol/L (ref 3.5–5.1)
Sodium: 136 mmol/L (ref 135–145)
Total Bilirubin: 0.4 mg/dL (ref 0.3–1.2)
Total Protein: 6.3 g/dL — ABNORMAL LOW (ref 6.5–8.1)

## 2018-09-03 LAB — CBC WITH DIFFERENTIAL/PLATELET
Abs Immature Granulocytes: 0.12 10*3/uL — ABNORMAL HIGH (ref 0.00–0.07)
Basophils Absolute: 0.1 10*3/uL (ref 0.0–0.1)
Basophils Relative: 0 %
Eosinophils Absolute: 0.1 10*3/uL (ref 0.0–0.5)
Eosinophils Relative: 1 %
HCT: 47.2 % — ABNORMAL HIGH (ref 36.0–46.0)
Hemoglobin: 14.3 g/dL (ref 12.0–15.0)
Immature Granulocytes: 1 %
Lymphocytes Relative: 8 %
Lymphs Abs: 1 10*3/uL (ref 0.7–4.0)
MCH: 28.5 pg (ref 26.0–34.0)
MCHC: 30.3 g/dL (ref 30.0–36.0)
MCV: 94 fL (ref 80.0–100.0)
Monocytes Absolute: 1.1 10*3/uL — ABNORMAL HIGH (ref 0.1–1.0)
Monocytes Relative: 9 %
Neutro Abs: 10.1 10*3/uL — ABNORMAL HIGH (ref 1.7–7.7)
Neutrophils Relative %: 81 %
Platelets: 136 10*3/uL — ABNORMAL LOW (ref 150–400)
RBC: 5.02 MIL/uL (ref 3.87–5.11)
RDW: 12.9 % (ref 11.5–15.5)
WBC: 12.5 10*3/uL — ABNORMAL HIGH (ref 4.0–10.5)
nRBC: 0 % (ref 0.0–0.2)

## 2018-09-03 LAB — TROPONIN I: Troponin I: <0.03 ng/mL (ref <0.03)

## 2018-09-03 MED ORDER — ALBUTEROL SULFATE (2.5 MG/3ML) 0.083% IN NEBU: 5.0000 mg | INHALATION_SOLUTION | Freq: Once | RESPIRATORY_TRACT | Status: DC

## 2018-09-03 MED ORDER — FENTANYL CITRATE (PF) 100 MCG/2ML IJ SOLN
50.0000 ug | Freq: Once | INTRAMUSCULAR | Status: AC
  Administered 2018-09-03: 50 ug via INTRAVENOUS
  Filled 2018-09-03: qty 2

## 2018-09-03 MED ORDER — IPRATROPIUM BROMIDE 0.02 % IN SOLN: 0.5000 mg | Freq: Once | RESPIRATORY_TRACT | Status: DC

## 2018-09-03 MED ORDER — FENTANYL CITRATE (PF) 100 MCG/2ML IJ SOLN
25.0000 ug | Freq: Once | INTRAMUSCULAR | Status: AC
  Administered 2018-09-03: 25 ug via INTRAVENOUS
  Filled 2018-09-03: qty 2

## 2018-09-03 MED ORDER — ALBUTEROL SULFATE (2.5 MG/3ML) 0.083% IN NEBU
2.5000 mg | INHALATION_SOLUTION | Freq: Once | RESPIRATORY_TRACT | Status: AC
  Administered 2018-09-03: 2.5 mg via RESPIRATORY_TRACT
  Filled 2018-09-03: qty 3

## 2018-09-03 MED ORDER — METHYLPREDNISOLONE SODIUM SUCC 125 MG IJ SOLR
125.0000 mg | Freq: Once | INTRAMUSCULAR | Status: AC
  Administered 2018-09-03: 125 mg via INTRAVENOUS
  Filled 2018-09-03: qty 2

## 2018-09-03 MED ORDER — NICOTINE 21 MG/24HR TD PT24
21.0000 mg | MEDICATED_PATCH | Freq: Every day | TRANSDERMAL | Status: DC
  Administered 2018-09-03 – 2018-09-06 (×4): 21 mg via TRANSDERMAL
  Filled 2018-09-03 (×4): qty 1

## 2018-09-03 MED ORDER — IOPAMIDOL (ISOVUE-370) INJECTION 76%
100.0000 mL | Freq: Once | INTRAVENOUS | Status: AC | PRN
  Administered 2018-09-03: 100 mL via INTRAVENOUS

## 2018-09-03 MED ORDER — MORPHINE SULFATE (PF) 4 MG/ML IV SOLN
4.0000 mg | INTRAVENOUS | Status: DC | PRN
  Administered 2018-09-04: 4 mg via INTRAVENOUS
  Filled 2018-09-03: qty 1

## 2018-09-03 MED ORDER — MORPHINE SULFATE (PF) 4 MG/ML IV SOLN
4.0000 mg | Freq: Once | INTRAVENOUS | Status: AC
  Administered 2018-09-03: 4 mg via INTRAVENOUS
  Filled 2018-09-03: qty 1

## 2018-09-03 NOTE — ED Notes (Addendum)
Notified ortho regarding lumbar corset.
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
## 2018-09-03 NOTE — ED Provider Notes (Signed)
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 compression is new since 2014 but stable since 2017. Mild retropulsion of the posterosuperior endplate. 30% loss of vertebral body height. L3 pedicles and posterior elements appear intact. No paraspinal stranding at this level. L4, L5, visible sacrum and SI joints appear intact. Paraspinal and other soft tissues: Reported separately today. Disc levels: Capacious lumbar spinal canal. Only mild lumbar spinal stenosis at the superior L3 level. IMPRESSION: 1. Osteopenia. L1 compression fracture is new since 2017 and appears acute. 35-40% loss of vertebral body height with minor retropulsion of bone and no significant spinal stenosis. 2. Chronic L3 compression fracture with mild retropulsion of bone and mild spinal stenosis. 3.  CT Abdomen and Pelvis today reported separately. Electronically Signed   By: Genevie Ann M.D.   On: 09/03/2018 21:55    Procedures Procedures (  including critical care time)  CRITICAL CARE Performed by: Wandra Arthurs   Total critical care time: 30 minutes  Critical care time was exclusive of separately billable procedures and treating other patients.  Critical care was necessary to treat or  prevent imminent or life-threatening deterioration.  Critical care was time spent personally by me on the following activities: development of treatment plan with patient and/or surrogate as well as nursing, discussions with consultants, evaluation of patient's response to treatment, examination of patient, obtaining history from patient or surrogate, ordering and performing treatments and interventions, ordering and review of laboratory studies, ordering and review of radiographic studies, pulse oximetry and re-evaluation of patient's condition.   Medications Ordered in ED Medications  nicotine (NICODERM CQ - dosed in mg/24 hours) patch 21 mg (21 mg Transdermal Patch Applied 09/03/18 2122)  fentaNYL (SUBLIMAZE) injection 25 mcg (25 mcg Intravenous Given 09/03/18 1911)  albuterol (PROVENTIL) (2.5 MG/3ML) 0.083% nebulizer solution 2.5 mg (2.5 mg Nebulization Given 09/03/18 1923)  methylPREDNISolone sodium succinate (SOLU-MEDROL) 125 mg/2 mL injection 125 mg (125 mg Intravenous Given 09/03/18 1934)  fentaNYL (SUBLIMAZE) injection 50 mcg (50 mcg Intravenous Given 09/03/18 2041)  iopamidol (ISOVUE-370) 76 % injection 100 mL (100 mLs Intravenous Contrast Given 09/03/18 2119)     Initial Impression / Assessment and Plan / ED Course  I have reviewed the triage vital signs and the nursing notes.  Pertinent labs & imaging results that were available during my care of the patient were reviewed by me and considered in my medical decision making (see chart for details).       Brandi Raymond is a 85 y.o. female here with fall, back pain, hypoxia. Patient has hx of COPD and has some wheezing. Likely COPD exacerbation. Also has back injury but has nl neuro exam. Will get labs, CXR, lumbar xray. Will give pain meds, nebs and reassess.   10:15 PM After nebs, steroids, patient is still wheezing. When she ambulates, she desat to 82% on RA. CXR showed new onset mass. CTA showed large mass with no PE, chronic L  subclavian occlusion. Patient has multiple compression fractures as well. I talked to [facility], NP from neurosurgery, regarding compression fracture. Since patient able to ambulate, she recommend lumbar corsette for comfort and pain control and outpatient neurosurgery follow up. Will admit for hypoxic from lung mass, COPD, pain control for compression fractures.    Final Clinical Impressions(s) / ED Diagnoses   Final diagnoses:  Fall    ED Discharge Orders    None       Drenda Freeze, MD 09/03/18 2248

## 2018-09-03 NOTE — ED Notes (Signed)
Spoke with [employer] regarding corsett - pt to be given RX at time of discharge and follow up in office to have corsett placed. Pt wants to go home, does not want to stay in hospital.

## 2018-09-03 NOTE — Progress Notes (Signed)
Orthopedic Tech Progress Note Patient Details:  Brandi Raymond [birth year] [id number]  Patient ID: Brandi Raymond, female   DOB: 03-14-1934, 85 y.o.   MRN: [id number] Called in order to bio tech.  Karolee Stamps 09/03/2018, 9:53 PM

## 2018-09-03 NOTE — Telephone Encounter (Signed)
Patient's prolia has arrived in our office---daughter/kathy notified, she has made nurse visit for 2/20 weather permitting---med is labeled with patients name and in refrig

## 2018-09-03 NOTE — ED Notes (Signed)
Pt placed on O2 2L [place] at triage-pt with hx COPD

## 2018-09-03 NOTE — ED Notes (Signed)
[facility] notified (Brandi Raymond) - patient ready for transport 

## 2018-09-04 ENCOUNTER — Encounter (HOSPITAL_COMMUNITY): Payer: Self-pay

## 2018-09-04 ENCOUNTER — Inpatient Hospital Stay (HOSPITAL_COMMUNITY): Payer: PPO

## 2018-09-04 DIAGNOSIS — R918 Other nonspecific abnormal finding of lung field: Secondary | ICD-10-CM

## 2018-09-04 DIAGNOSIS — S32010A Wedge compression fracture of first lumbar vertebra, initial encounter for closed fracture: Secondary | ICD-10-CM

## 2018-09-04 DIAGNOSIS — J9601 Acute respiratory failure with hypoxia: Principal | ICD-10-CM

## 2018-09-04 DIAGNOSIS — I10 Essential (primary) hypertension: Secondary | ICD-10-CM

## 2018-09-04 DIAGNOSIS — E039 Hypothyroidism, unspecified: Secondary | ICD-10-CM

## 2018-09-04 DIAGNOSIS — J441 Chronic obstructive pulmonary disease with (acute) exacerbation: Secondary | ICD-10-CM

## 2018-09-04 LAB — POCT I-STAT EG7
Acid-Base Excess: 6 mmol/L — ABNORMAL HIGH (ref 0.0–2.0)
Bicarbonate: 35.1 mmol/L — ABNORMAL HIGH (ref 20.0–28.0)
Calcium, Ion: 1.22 mmol/L (ref 1.15–1.40)
HCT: 44 % (ref 36.0–46.0)
Hemoglobin: 15 g/dL (ref 12.0–15.0)
O2 Saturation: 28 %
Potassium: 4 mmol/L (ref 3.5–5.1)
Sodium: 138 mmol/L (ref 135–145)
TCO2: 37 mmol/L — ABNORMAL HIGH (ref 22–32)
pCO2, Ven: 68.9 mmHg — ABNORMAL HIGH (ref 44.0–60.0)
pH, Ven: 7.315 (ref 7.250–7.430)
pO2, Ven: 21 mmHg — CL (ref 32.0–45.0)

## 2018-09-04 LAB — HIV ANTIBODY (ROUTINE TESTING W REFLEX): HIV Screen 4th Generation wRfx: NONREACTIVE

## 2018-09-04 MED ORDER — ASPIRIN EC 81 MG PO TBEC
81.0000 mg | DELAYED_RELEASE_TABLET | Freq: Every day | ORAL | Status: DC
  Administered 2018-09-04 – 2018-09-06 (×3): 81 mg via ORAL
  Filled 2018-09-04 (×3): qty 1

## 2018-09-04 MED ORDER — ACETAMINOPHEN 325 MG PO TABS: 650.0000 mg | ORAL_TABLET | Freq: Four times a day (QID) | ORAL | Status: DC | PRN

## 2018-09-04 MED ORDER — ONDANSETRON HCL 4 MG PO TABS: 4.0000 mg | ORAL_TABLET | Freq: Four times a day (QID) | ORAL | Status: DC | PRN

## 2018-09-04 MED ORDER — ONDANSETRON HCL 4 MG/2ML IJ SOLN: 4.0000 mg | Freq: Four times a day (QID) | INTRAMUSCULAR | Status: DC | PRN

## 2018-09-04 MED ORDER — LISINOPRIL 10 MG PO TABS
10.0000 mg | ORAL_TABLET | Freq: Every day | ORAL | Status: DC
  Administered 2018-09-04 – 2018-09-06 (×3): 10 mg via ORAL
  Filled 2018-09-04 (×3): qty 1

## 2018-09-04 MED ORDER — ACETAMINOPHEN 650 MG RE SUPP: 650.0000 mg | Freq: Four times a day (QID) | RECTAL | Status: DC | PRN

## 2018-09-04 MED ORDER — PRAVASTATIN SODIUM 20 MG PO TABS
40.0000 mg | ORAL_TABLET | Freq: Every day | ORAL | Status: DC
  Administered 2018-09-04 – 2018-09-05 (×2): 40 mg via ORAL
  Filled 2018-09-04 (×3): qty 2

## 2018-09-04 MED ORDER — GABAPENTIN 300 MG PO CAPS
600.0000 mg | ORAL_CAPSULE | Freq: Every day | ORAL | Status: DC
  Administered 2018-09-04 – 2018-09-05 (×2): 600 mg via ORAL
  Filled 2018-09-04 (×2): qty 2

## 2018-09-04 MED ORDER — IPRATROPIUM-ALBUTEROL 0.5-2.5 (3) MG/3ML IN SOLN: 3.0000 mL | Freq: Four times a day (QID) | RESPIRATORY_TRACT | Status: DC | PRN

## 2018-09-04 MED ORDER — DONEPEZIL HCL 10 MG PO TABS
10.0000 mg | ORAL_TABLET | Freq: Every day | ORAL | Status: DC
  Administered 2018-09-04 – 2018-09-05 (×2): 10 mg via ORAL
  Filled 2018-09-04 (×2): qty 1

## 2018-09-04 MED ORDER — MORPHINE SULFATE (PF) 4 MG/ML IV SOLN
4.0000 mg | INTRAVENOUS | Status: DC | PRN
  Administered 2018-09-04 (×4): 4 mg via INTRAVENOUS
  Filled 2018-09-04 (×4): qty 1

## 2018-09-04 MED ORDER — HEPARIN SODIUM (PORCINE) 5000 UNIT/ML IJ SOLN
5000.0000 [IU] | Freq: Three times a day (TID) | INTRAMUSCULAR | Status: DC
  Administered 2018-09-04 (×2): 5000 [IU] via SUBCUTANEOUS
  Filled 2018-09-04 (×3): qty 1

## 2018-09-04 MED ORDER — DULOXETINE HCL 60 MG PO CPEP
60.0000 mg | ORAL_CAPSULE | Freq: Every day | ORAL | Status: DC
  Administered 2018-09-04 – 2018-09-06 (×3): 60 mg via ORAL
  Filled 2018-09-04 (×4): qty 1

## 2018-09-04 MED ORDER — CLONAZEPAM 0.5 MG PO TABS
0.5000 mg | ORAL_TABLET | Freq: Every day | ORAL | Status: DC
  Administered 2018-09-04 – 2018-09-05 (×2): 0.5 mg via ORAL
  Filled 2018-09-04 (×2): qty 1

## 2018-09-04 MED ORDER — PREDNISONE 20 MG PO TABS
40.0000 mg | ORAL_TABLET | Freq: Every day | ORAL | Status: DC
  Administered 2018-09-04 – 2018-09-06 (×3): 40 mg via ORAL
  Filled 2018-09-04 (×3): qty 2

## 2018-09-04 MED ORDER — OXYCODONE HCL 5 MG PO TABS
5.0000 mg | ORAL_TABLET | ORAL | Status: DC | PRN
  Administered 2018-09-04 – 2018-09-06 (×5): 5 mg via ORAL
  Filled 2018-09-04 (×5): qty 1

## 2018-09-04 MED ORDER — CLOPIDOGREL BISULFATE 75 MG PO TABS
75.0000 mg | ORAL_TABLET | Freq: Every day | ORAL | Status: DC
  Administered 2018-09-04 – 2018-09-06 (×3): 75 mg via ORAL
  Filled 2018-09-04 (×3): qty 1

## 2018-09-04 MED ORDER — AMLODIPINE BESYLATE 5 MG PO TABS
5.0000 mg | ORAL_TABLET | Freq: Every day | ORAL | Status: DC
  Administered 2018-09-04 – 2018-09-06 (×3): 5 mg via ORAL
  Filled 2018-09-04 (×3): qty 1

## 2018-09-04 MED ORDER — CALCITONIN (SALMON) 200 UNIT/ACT NA SOLN
1.0000 | Freq: Every day | NASAL | Status: DC
  Administered 2018-09-05 – 2018-09-06 (×2): 1 via NASAL
  Filled 2018-09-04: qty 3.7

## 2018-09-04 MED ORDER — GADOBUTROL 1 MMOL/ML IV SOLN
5.0000 mL | Freq: Once | INTRAVENOUS | Status: AC | PRN
  Administered 2018-09-04: 5 mL via INTRAVENOUS

## 2018-09-04 MED ORDER — CALCIUM CARBONATE-VITAMIN D 500-200 MG-UNIT PO TABS
1.0000 | ORAL_TABLET | Freq: Two times a day (BID) | ORAL | Status: DC
  Administered 2018-09-04 – 2018-09-06 (×5): 1 via ORAL
  Filled 2018-09-04 (×6): qty 1

## 2018-09-04 MED ORDER — GABAPENTIN 300 MG PO CAPS
300.0000 mg | ORAL_CAPSULE | Freq: Two times a day (BID) | ORAL | Status: DC
  Administered 2018-09-04 – 2018-09-06 (×5): 300 mg via ORAL
  Filled 2018-09-04 (×5): qty 1

## 2018-09-04 MED ORDER — DOCUSATE SODIUM 100 MG PO CAPS
100.0000 mg | ORAL_CAPSULE | Freq: Two times a day (BID) | ORAL | Status: DC
  Administered 2018-09-04 – 2018-09-06 (×6): 100 mg via ORAL
  Filled 2018-09-04 (×6): qty 1

## 2018-09-04 MED ORDER — FLUTICASONE FUROATE-VILANTEROL 100-25 MCG/INH IN AEPB
1.0000 | INHALATION_SPRAY | Freq: Every day | RESPIRATORY_TRACT | Status: DC
  Administered 2018-09-06: 1 via RESPIRATORY_TRACT
  Filled 2018-09-04: qty 28

## 2018-09-04 MED ORDER — LEVOTHYROXINE SODIUM 125 MCG PO TABS
125.0000 ug | ORAL_TABLET | Freq: Every day | ORAL | Status: DC
  Administered 2018-09-04 – 2018-09-06 (×3): 125 ug via ORAL
  Filled 2018-09-04 (×3): qty 1

## 2018-09-04 MED ORDER — GABAPENTIN 300 MG PO CAPS: 300.0000 mg | ORAL_CAPSULE | ORAL | Status: DC

## 2018-09-04 NOTE — Assessment & Plan Note (Signed)
Stable

## 2018-09-04 NOTE — Evaluation (Signed)
Physical Therapy Evaluation Patient Details Name: Brandi Raymond MRN: [id number] DOB: 02/03/1934 Today's Date: 09/04/2018   History of Present Illness  85 yo female with history of COPD, HTN, hypothyroidism presents to the ED after having a fall at home. CT of spine showing L1 compression fx. CT of chest shows mass at right lower lob; undergoing work up for possible lung cancer. Negative for PE.   Clinical Impression  The patient required cueing for donning Brace for back. Multiple family members present. Patient with increased dyspnea on 2 L while ambulating. Sats 85% on RA after short break in BR. Did not get an ambulation saturation. If requires Oxygen, patient will need to be trained a inmuse and safety with tubing.Pt admitted with above diagnosis. Pt currently with functional limitations due to the deficits listed below (see PT Problem List). Pt will benefit from skilled PT to increase their independence and safety with mobility to allow discharge to the venue listed below.       Follow Up Recommendations Home health PT;Supervision/Assistance - 24 hour    Equipment Recommendations  None recommended by PT    Recommendations for Other Services       Precautions / Restrictions Precautions Precautions: Back;Fall Precaution Booklet Issued: Yes (comment) Precaution Comments: Provided handout and education on back precautions and adherance during ADLs Required Braces or Orthoses: Spinal Brace Spinal Brace: Lumbar corset;Applied in sitting position      Mobility  Bed Mobility Overal bed mobility: Needs Assistance Bed Mobility: Rolling;Sidelying to Sit Rolling: Supervision Sidelying to sit: Supervision       General bed mobility comments: Educating pt on safe bed mobility with log roll technique. Pt demonstrating understanding and performed with supervision for safety  Transfers Overall transfer level: Needs assistance Equipment used: Rolling walker (2 wheeled) Transfers: Sit  to/from Stand Sit to Stand: Min assist         General transfer comment: Min A for gaining balance once in standing. Cues for hand placement  Ambulation/Gait Ambulation/Gait assistance: Min assist Gait Distance (Feet): 80 Feet Assistive device: Rolling walker (2 wheeled) Gait Pattern/deviations: Step-to pattern;Step-through pattern;Drifts right/left Gait velocity: decr   General Gait Details: cues for safety, position inside RW. Patient on 2 liters [place].,Increased SOB  Stairs            Wheelchair Mobility    Modified Rankin (Stroke Patients Only)       Balance Overall balance assessment: Needs assistance Sitting-balance support: No upper extremity supported;Feet supported Sitting balance-Leahy Scale: Fair     Standing balance support: Bilateral upper extremity supported;During functional activity Standing balance-Leahy Scale: Poor Standing balance comment: Reliant on UE support                             Pertinent Vitals/Pain Pain Assessment: Faces Faces Pain Scale: Hurts little more Pain Location: Back Pain Descriptors / Indicators: Discomfort;Grimacing Pain Intervention(s): Monitored during session;Premedicated before session    Home Living Family/patient expects to be discharged to:: Private residence Living Arrangements: Spouse/significant other;Children Available Help at Discharge: Family;Available 24 hours/day Type of Home: House Home Access: Stairs to enter Entrance Stairs-Rails: Psychiatric nurse of Steps: 3 Home Layout: One level Home Equipment: Shower seat      Prior Function Level of Independence: Independent         Comments: ADLs and IADLs; husband drives     Hand Dominance        Extremity/Trunk Assessment   Upper  Extremity Assessment Upper Extremity Assessment: Overall WFL for tasks assessed    Lower Extremity Assessment Lower Extremity Assessment: Generalized weakness    Cervical / Trunk  Assessment Cervical / Trunk Assessment: Other exceptions Cervical / Trunk Exceptions: L1 compression fx  Communication   Communication: No difficulties  Cognition Arousal/Alertness: Awake/alert Behavior During Therapy: WFL for tasks assessed/performed Overall Cognitive Status: Within Functional Limits for tasks assessed                                        General Comments General comments (skin integrity, edema, etc.): SpO2 dropping to 80s on RA. Pt using 2L and sustaining >88%. RN notified.    Exercises     Assessment/Plan    PT Assessment Patient needs continued PT services  PT Problem List Decreased strength;Decreased activity tolerance;Decreased balance;Decreased mobility;Decreased knowledge of precautions;Decreased safety awareness;Decreased knowledge of use of DME;Cardiopulmonary status limiting activity       PT Treatment Interventions DME instruction;Therapeutic exercise;Gait training;Balance training;Functional mobility training;Therapeutic activities    PT Goals (Current goals can be found in the Care Plan section)  Acute Rehab PT Goals Patient Stated Goal: Go home PT Goal Formulation: With patient/family Time For Goal Achievement: 09/18/18 Potential to Achieve Goals: Good    Frequency Min 3X/week   Barriers to discharge        Co-evaluation               AM-PAC PT "6 Clicks" Mobility  Outcome Measure Help needed turning from your back to your side while in a flat bed without using bedrails?: A Little Help needed moving from lying on your back to sitting on the side of a flat bed without using bedrails?: A Little Help needed moving to and from a bed to a chair (including a wheelchair)?: A Lot Help needed standing up from a chair using your arms (e.g., wheelchair or bedside chair)?: A Lot Help needed to walk in hospital room?: A Lot Help needed climbing 3-5 steps with a railing? : A Lot 6 Click Score: 14    End of Session    Activity Tolerance: Patient tolerated treatment well Patient left: in bed;with call bell/phone within reach;with bed alarm set;with family/visitor present Nurse Communication: Mobility status PT Visit Diagnosis: Unsteadiness on feet (R26.81);Repeated falls (R29.6);Muscle weakness (generalized) (M62.81)    Time: 5638-7564 PT Time Calculation (min) (ACUTE ONLY): 23 min   Charges:   PT Evaluation $PT Eval Low Complexity: 1 Low PT Treatments $Gait Training: 8-22 mins        Tresa Endo PT Acute Rehabilitation Services Pager [phone] Office [phone]   Claretha Cooper 09/04/2018, 2:05 PM

## 2018-09-04 NOTE — Progress Notes (Signed)
Attempted to wean patient off of oxygen however pt oxygen saturation dropped to 85% on RA. 2L nasal cannula reapplied. Will continue to monitor.

## 2018-09-04 NOTE — Subjective & Objective (Addendum)
CC:SOB HPI:  85 year old female with history of COPD, hypertension, hypothyroidism presents to the ER after having a fall at home.  Patient states she was cleaning a lamp on the floor.  She lost her balance.  She struck her back on some furniture at home.  Daughter states the patient has very poor balance.  She denied any head injury or loss of consciousness.  She had severe pain in her back.  Patient was brought to the ER by her family due to back pain.  She was noted to be hypoxic in the ER.  Supplemental oxygen was started.  CT scan of the chest performed due to her wheezing and shortness of breath demonstrated a 1.9 x 1.6 x 2.0 cm right lower lobe lobulated mass.  This was not present in her CT in 2014.  There is a high suspicion for lung cancer.  Patient has been informed by the ER provider that she has a lung mass and may have lung cancer.  Patient transferred to [facility] hospital due to her acute hypoxic respiratory failure.  Her CTPA was negative for pulmonary embolism.  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  She has diffuse coronary atherosclerotic plaque in 3 vessels on her CT.  Patient specifically denies any recent weight loss, hemoptysis.

## 2018-09-04 NOTE — ED Notes (Signed)
[facility] at bedside 

## 2018-09-04 NOTE — Care Management Note (Signed)
Case Management Note  Patient Details  Name: Brandi Raymond MRN: [id number] Date of Birth: 1934/01/05  Subjective/Objective:                  Discharge Planning  Action/Plan: Disposition-home DME- back brace through [employer]   Expected Discharge Date:                  Expected Discharge Plan:  Home/Self Care  In-House Referral:     Discharge planning Services  CM Consult  Post Acute Care Choice:    Choice offered to:     DME Arranged:    DME Agency:     HH Arranged:    [place] Agency:     Status of Service:  Completed, signed off  If discussed at [employer] of Stay Meetings, dates discussed:    Additional Comments:  Leeroy Cha, RN 09/04/2018, 9:45 AM

## 2018-09-04 NOTE — Assessment & Plan Note (Signed)
Continue scheduled nebs. Continue with PO prednisone.

## 2018-09-04 NOTE — Assessment & Plan Note (Signed)
Continue with IV morphine for severe pain. Lumbar corset ordered by neurosurgery. Will have PT see patient in consult.

## 2018-09-04 NOTE — Progress Notes (Signed)
Nutrition Brief Note  RD consulted via COPD protocol.  Patient's weight has overall increased since 2018. Pt does not endorse any weight changes. Pt is consuming 100% of meals at this time. Will monitor for any changes in intakes.   Wt Readings from Last 15 Encounters:  09/04/18 53.4 kg  08/30/18 53.7 kg  08/14/18 54.3 kg  07/19/18 54.3 kg  06/25/18 54 kg  05/10/18 53 kg  04/25/18 52.5 kg  02/11/18 52.2 kg  01/14/18 51.7 kg  12/13/17 51.7 kg  10/22/17 51.7 kg  07/13/17 52.6 kg  06/18/17 53.4 kg  06/11/17 52.6 kg  01/22/17 52.6 kg    Body mass index is 19.59 kg/m. Patient meets criteria for normal based on current BMI.   Current diet order is 2GMNA, patient is consuming approximately 100% of meals at this time. Labs and medications reviewed.   No nutrition interventions warranted at this time. If nutrition issues arise, please consult RD.   Clayton Bibles, MS, RD, [place] Dietitian Pager: [phone] After Hours Pager: [phone]

## 2018-09-04 NOTE — ED Notes (Signed)
[facility] notified Brandi Raymond) - patient ready for transport to [facility] rather than [facility]

## 2018-09-04 NOTE — Progress Notes (Signed)
Admission from earlier this morning with mechanical fall.  Briefly, 85 year old female with history of COPD (not on oxygen), hypertension, hypothyroidism and poor balance admitted due to accidental fall with subsequent L1 compression fracture as noted on CT.  Chest x-ray showed new 17 x 12 mm masslike opacity over right mid to lower lobe.  DG pelvis without acute osseous abnormality  DG  lumbar spine showed new age-indeterminate L1 compression deformity with 20 to 30% anterior loss of height and stable mild L3 compression deformity  CT lumbar spine-osteopenia.  New L1 compression fracture new since 2017.  35 to 40% loss of vertebral body height no retropulsion of bone no significant spinal stenosis.  Chronic L3 compression fracture.  CTA chest/abdomen/pelvis: RLL new lobulated mass measuring 1.9 x 1.6 x 2.0 cm suspicious for lung carcinoma.  Chronic occlusion of left subclavian artery and probably greater than 50% stenosis of left CCA.  L1 and L3 compression fractures noted.  Patient has no major complaints this morning.  Pain fairly controlled.  Asks when she could go home.  Denies chest pain, dyspnea, nausea, vomiting or abdominal pain.  Vitals:   09/04/18 0700 09/04/18 1027 09/04/18 1057 09/04/18 1319  BP:  127/71  (!) 157/73  Pulse:  63  75  Resp:  18  16  Temp:  98.8 F (37.1 C)  98.9 F (37.2 C)  TempSrc:  Oral  Oral  SpO2:  96%  92%  Weight: 53.4 kg  53.4 kg   Height:   5\' 5"  (1.651 m)     GENERAL: Appears well. No acute distress.  EYES - vision grossly intact. Sclera anicteric.  NOSE- no gross deformity or drainage MOUTH - no oral lesions noted THROAT- no swelling or erythema LUNGS:  No IWOB.  Fair aeration bilaterally. HEART:  RRR. Heart sounds normal. ABD: Bowel sounds present. Soft. Non tender.  MSK/EXT: Moves extremities. No obvious deformity. SKIN: no apparent skin lesion.  NEURO: Awake, alert and oriented appropriately.  No gross deficit.  PSYCH: Calm. Normal  affect.    Assessment and plan Incidental fall/osteoporosis L1 compression fracture-new L3 compression fracture-old and stable -Neurosurgery consulted and recommended lumbar corset -Plan to restart Reclast outpatient -PT/OT recommended 24-hour supervision -Pain control -Calcitonin -Calcium/vitamin D  Right lower lobe lung nodule: 1.9 x 1.6 x 2.0 cm as noted on CT. -Oncology, Dr. Walden Field consulted and recommended MRI brain to exclude metastasis -Oncology to arrange outpatient follow-up  Hypoxic respiratory failure due to acute COPD exacerbation: On oxygen at home.  Desaturated to mid 62s on room air during occupational therapy evaluation. -Continue prednisone, DuoNeb -Wean oxygen as able  Peripheral vascular disease Status post EAC of both left and right carotid artery History of native artery CAD -Continue home statin, Plavix and aspirin  Mood disorder/dementia: Stable -Continue home meds (donepezil, Cymbalta and Klonopin)  Hypertension: -Continue home amlodipine lisinopril.

## 2018-09-04 NOTE — Plan of Care (Signed)

## 2018-09-04 NOTE — H&P (Addendum)
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 There is mild paraspinal soft tissue edema/inflammation at this level. L2 is intact. L3 superior endplate compression is new since 2014 but stable since 2017. Mild retropulsion of the posterosuperior endplate. 30% loss of vertebral body height. L3 pedicles and posterior elements appear intact. No paraspinal stranding at this level. L4, L5, visible sacrum and SI joints appear intact. Paraspinal and other soft tissues: Reported separately today. Disc levels: Capacious lumbar spinal canal. Only mild lumbar spinal stenosis at the superior L3 level. IMPRESSION: 1. Osteopenia. L1 compression fracture is new since 2017 and appears acute. 35-40% loss of vertebral body height with minor retropulsion of bone and no significant spinal stenosis. 2. Chronic L3 compression fracture with mild retropulsion of bone and mild spinal stenosis. 3.  CT Abdomen and Pelvis today reported separately. Electronically Signed   By: Genevie Ann M.D.   On: 09/03/2018 21:55    EKG: I have personally reviewed EKG: NSR, IVCD  Assessment/Plan Principal  Problem:   Acute respiratory failure with hypoxia (HCC) Active Problems:   COPD with acute exacerbation (HCC)   Essential hypertension   Right lower lobe lung mass   Closed compression fracture of L1 lumbar vertebra, initial encounter (HCC)   Hypothyroidism    Acute respiratory failure with hypoxia ([place]) Admit to medical bed. Continue with supplemental O2. Wean to RA as tolerated to keep O2 sats >92%  COPD with acute exacerbation ([place]) Continue scheduled nebs. Continue with PO prednisone.  Essential hypertension Continue with home meds.  Right lower lobe lung mass Pt and her dtr Levi Aland informed of her lung mass and possibility of lung mass. Will obtain heme/onc consult in the AM.  Closed compression fracture of L1 lumbar vertebra, initial encounter ([place]) Continue with IV morphine for severe pain. Lumbar corset ordered by neurosurgery. Will have PT see patient in consult.  Hypothyroidism Stable.   DVT prophylaxis: SQ Heparin Code Status: Full Code Family Communication: Discussed with the patient and her daughter Brandi Raymond at bedside. Disposition Plan: Discharged home after patient is seen heme-onc to decide about how to work-up her lung mass and if she can be weaned off of oxygen. Consults called: none  Admission status: Observation,Med-Surg   Kristopher Oppenheim, DO [facility] 09/04/2018, 2:33 AM

## 2018-09-04 NOTE — Evaluation (Signed)
Occupational Therapy Evaluation Patient Details Name: Brandi Raymond MRN: [id number] DOB: 05/03/1934 Today's Date: 09/04/2018    History of Present Illness 85 yo female with history of COPD, HTN, hypothyroidism presents to the ED after having a fall at home. CT of spine showing L1 compression fx. CT of chest shows mass at right lower lob; undergoing work up for possible lung cancer. Negative for PE.     Clinical Impression   PTA, pt was living with her husband and was independent. Currently, pt requires Min A for UB ADLs, Min-Mod A for LB ADLs, and Min Guard-Min A for functional mobility using RW. Provided education and handout on back precautions, brace management, bed mobility, LB ADLs, and functional transfers with RW. Pt SpO2 dropping to 80s on RA while at rest. SpO2 staying >88% on 2L during activity; RN notified. Pt would benefit from further acute OT to address LB ADLs and safe functional transfers as well as facilitate safe dc. Recommend dc to home with HHOT for further OT to optimize safety, independence with ADLs, and return to PLOF.       Follow Up Recommendations  Home health OT;Supervision/Assistance - 24 hour    Equipment Recommendations  None recommended by OT    Recommendations for Other Services PT consult     Precautions / Restrictions Precautions Precautions: Back;Fall Precaution Booklet Issued: Yes (comment) Precaution Comments: Provided handout and education on back precautions and adherance during ADLs Required Braces or Orthoses: Spinal Brace Spinal Brace: Lumbar corset      Mobility Bed Mobility Overal bed mobility: Needs Assistance Bed Mobility: Rolling;Sidelying to Sit Rolling: Supervision Sidelying to sit: Supervision       General bed mobility comments: Educating pt on safe bed mobility with log roll technique. Pt demosntrating understanding and performed with supervision for safety  Transfers Overall transfer level: Needs  assistance Equipment used: Rolling walker (2 wheeled) Transfers: Sit to/from Stand Sit to Stand: Min assist         General transfer comment: Min A for gaining balance once in standing. Cues for hand placement    Balance Overall balance assessment: Needs assistance Sitting-balance support: No upper extremity supported;Feet supported Sitting balance-Leahy Scale: Fair     Standing balance support: Bilateral upper extremity supported;During functional activity Standing balance-Leahy Scale: Poor Standing balance comment: Reliant on UE support                           ADL either performed or assessed with clinical judgement   ADL Overall ADL's : Needs assistance/impaired Eating/Feeding: Independent;Sitting   Grooming: Supervision/safety;Set up;Standing   Upper Body Bathing: Min guard;Sitting   Lower Body Bathing: Minimal assistance;Sit to/from stand   Upper Body Dressing : Minimal assistance;Sitting Upper Body Dressing Details (indicate cue type and reason): Education on brace management and donning. Min A for sequencing.  Lower Body Dressing: Moderate assistance;Sit to/from stand Lower Body Dressing Details (indicate cue type and reason): Pt able to bring ankles to knees but with increased time and effort. Would benefit from education on AE for LB dressing Toilet Transfer: Min guard;Ambulation;RW(Simulated to recliner) Armed forces technical officer Details (indicate cue type and reason): Educating pt on transfer techniques and safe management of RW         Functional mobility during ADLs: Min guard;Rolling walker General ADL Comments: Providing education and handout on back precautions, bed mobility, brace management, and safe transfers. SpO2 dropping to 80s on RA. On 2L, pt >88% during activity.  Vision Baseline Vision/History: Wears glasses Patient Visual Report: No change from baseline       Perception     Praxis      Pertinent Vitals/Pain Pain Assessment:  Faces Faces Pain Scale: Hurts little more Pain Location: Back Pain Descriptors / Indicators: Discomfort;Grimacing Pain Intervention(s): Monitored during session;Repositioned     Hand Dominance     Extremity/Trunk Assessment Upper Extremity Assessment Upper Extremity Assessment: Overall WFL for tasks assessed   Lower Extremity Assessment Lower Extremity Assessment: Defer to PT evaluation   Cervical / Trunk Assessment Cervical / Trunk Assessment: Other exceptions Cervical / Trunk Exceptions: L1 compression fx   Communication Communication Communication: No difficulties   Cognition Arousal/Alertness: Awake/alert Behavior During Therapy: WFL for tasks assessed/performed Overall Cognitive Status: Within Functional Limits for tasks assessed                                     General Comments  SpO2 dropping to 80s on RA. Pt using 2L and sustaining >88%. RN notified.    Exercises     Shoulder Instructions      Home Living Family/patient expects to be discharged to:: Private residence Living Arrangements: Spouse/significant other;Children Available Help at Discharge: Family;Available 24 hours/day Type of Home: House Home Access: Stairs to enter [employer] of Steps: 3 Entrance Stairs-Rails: Right;Left Home Layout: One level     Bathroom Shower/Tub: Occupational psychologist: Handicapped height     Home Equipment: Shower seat          Prior Functioning/Environment Level of Independence: Independent        Comments: ADLs and IADLs; husband drives        OT Problem List: Decreased strength;Decreased range of motion;Decreased activity tolerance;Impaired balance (sitting and/or standing);Decreased knowledge of use of DME or AE;Decreased knowledge of precautions;Pain;Cardiopulmonary status limiting activity      OT Treatment/Interventions: Self-care/ADL training;Therapeutic exercise;Energy conservation;DME and/or AE  instruction;Therapeutic activities;Patient/family education    OT Goals(Current goals can be found in the care plan section) Acute Rehab OT Goals Patient Stated Goal: Go home OT Goal Formulation: With patient Time For Goal Achievement: 09/18/18 Potential to Achieve Goals: Good  OT Frequency: Min 2X/week   Barriers to D/C:            Co-evaluation              AM-PAC OT "6 Clicks" Daily Activity     Outcome Measure Help from another person eating meals?: None Help from another person taking care of personal grooming?: A Little Help from another person toileting, which includes using toliet, bedpan, or urinal?: A Little Help from another person bathing (including washing, rinsing, drying)?: A Little Help from another person to put on and taking off regular upper body clothing?: A Little Help from another person to put on and taking off regular lower body clothing?: A Lot 6 Click Score: 18   End of Session Equipment Utilized During Treatment: Rolling walker;Oxygen;Back brace Nurse Communication: Mobility status;Precautions;Other (comment)(SpO2)  Activity Tolerance: Patient tolerated treatment well Patient left: in chair;with call bell/phone within reach;with chair alarm set;with family/visitor present  OT Visit Diagnosis: Unsteadiness on feet (R26.81);Other abnormalities of gait and mobility (R26.89);Muscle weakness (generalized) (M62.81);Pain Pain - part of body: (Back)                Time: 8546-2703 OT Time Calculation (min): 21 min Charges:  OT General Charges $OT  Visit: 1 Visit OT Evaluation $OT Eval Moderate Complexity: 1 Mod  Juwann Sherk MSOT, OTR/L Acute Rehab Pager: [phone] Office: [place] 09/04/2018, 12:34 PM

## 2018-09-04 NOTE — Assessment & Plan Note (Signed)
Admit to medical bed. Continue with supplemental O2. Wean to RA as tolerated to keep O2 sats >92%

## 2018-09-04 NOTE — Assessment & Plan Note (Signed)
Continue with home meds.

## 2018-09-04 NOTE — Assessment & Plan Note (Addendum)
Pt and her dtr Brandi Raymond informed of her lung mass and possibility of lung mass. Will obtain heme/onc consult in the AM.

## 2018-09-05 ENCOUNTER — Telehealth: Payer: Self-pay

## 2018-09-05 ENCOUNTER — Ambulatory Visit: Payer: PPO

## 2018-09-05 DIAGNOSIS — R918 Other nonspecific abnormal finding of lung field: Secondary | ICD-10-CM

## 2018-09-05 LAB — CREATININE, SERUM
Creatinine, Ser: 0.81 mg/dL (ref 0.44–1.00)
GFR calc Af Amer: >60 mL/min (ref >60)
GFR calc non Af Amer: >60 mL/min (ref >60)

## 2018-09-05 LAB — CBC
HCT: 45.8 % (ref 36.0–46.0)
Hemoglobin: 13.7 g/dL (ref 12.0–15.0)
MCH: 29 pg (ref 26.0–34.0)
MCHC: 29.9 g/dL — ABNORMAL LOW (ref 30.0–36.0)
MCV: 97 fL (ref 80.0–100.0)
Platelets: 129 10*3/uL — ABNORMAL LOW (ref 150–400)
RBC: 4.72 MIL/uL (ref 3.87–5.11)
RDW: 12.6 % (ref 11.5–15.5)
WBC: 14.9 10*3/uL — ABNORMAL HIGH (ref 4.0–10.5)
nRBC: 0 % (ref 0.0–0.2)

## 2018-09-05 MED ORDER — NICOTINE 21 MG/24HR TD PT24: 21.0000 mg | MEDICATED_PATCH | Freq: Every day | TRANSDERMAL | 0 refills | Status: DC

## 2018-09-05 MED ORDER — PREDNISONE 20 MG PO TABS: 40.0000 mg | ORAL_TABLET | Freq: Every day | ORAL | 0 refills | Status: DC

## 2018-09-05 MED ORDER — ENOXAPARIN SODIUM 40 MG/0.4ML ~~LOC~~ SOLN
40.0000 mg | SUBCUTANEOUS | Status: DC
  Administered 2018-09-05: 40 mg via SUBCUTANEOUS
  Filled 2018-09-05: qty 0.4

## 2018-09-05 MED ORDER — OXYCODONE HCL 5 MG PO TABS: 5.0000 mg | ORAL_TABLET | ORAL | 0 refills | Status: DC | PRN

## 2018-09-05 MED ORDER — ACETAMINOPHEN 325 MG PO TABS: 650.0000 mg | ORAL_TABLET | ORAL | 2 refills | Status: AC

## 2018-09-05 NOTE — Progress Notes (Signed)
PROGRESS NOTE  Brandi PRESTIGIACOMO UVO:[id number] DOB: 1934-03-26 DOA: 09/03/2018 PCP: Binnie Rail, MD   LOS: 2 days   Brief Narrative / Interim history: Briefly, 85 year old female with history of COPD (not on oxygen), hypertension,hypothyroidism and poor balance admitted due to accidental fall with subsequent L1 compression fracture as noted on CT.  Chest x-ray showed new17 x 12 mm masslike opacity over right mid to lower lobe.  DG pelvis without acute osseous abnormality  DGlumbar spine showed new age-indeterminate L1 compression deformity with 20 to 30% anterior loss of height and stable mild L3 compression deformity  CT lumbar spine-osteopenia.New L1 compression fracture new since 2017.35 to 40% loss of vertebral body height no retropulsion of bone no significant spinal stenosis.Chronic L3 compression fracture.  CTAchest/abdomen/pelvis:RLL new lobulated mass measuring 1.9 x 1.6 x 2.0 cm suspicious for lung carcinoma. Chronic occlusion of left subclavian artery and probably greater than 50% stenosis of left CCA.L1 and L3 compression fractures noted.  Per admitting provider, neurosurgery consulted and recommended lumbar corset and pain management.  Subjective: No major events overnight of this morning.  Pain fairly controlled.  Denies chest pain.  Breathing has somewhat improved.  Cough at baseline.  Denies nausea or vomiting.  Assessment & Plan: Principal Problem:   Acute respiratory failure with hypoxia (HCC) Active Problems:   Hypothyroidism   Essential hypertension   COPD with acute exacerbation (HCC)   Right lower lobe lung mass   Closed compression fracture of L1 lumbar vertebra, initial encounter (HCC)  Mechanical fall/osteoporosis L1 compression fracture-new L3 compression fracture-old and stable -Continue lumbar corset per neurosurgery recommendation -Pain management -Plan to restart Reclast outpatient -PT/OT-HH PT/OT with 24-hour  supervision -Calcitonin nasal spray -Calcium/vitamin D  Right lower lobe lung nodule:1.9 x 1.6 x 2.0 cm as noted on CT. -Oncology, Dr.Higgs consulted andrecommended MRI brain which was negative for acute intracranial process or metastasis. -Oncology to arrange outpatient follow-up  Hypoxic respiratory failure due to acute COPD exacerbation:Not on oxygen at home.  Desaturated to 84% on room air with ambulation. -Continue prednisone, Breo Ellipta, DuoNeb -We will give Stiolto on discharge. -May need to go home with oxygen  Peripheral vascular disease Status postEAC of bothleft and right carotid artery History of native artery CAD -Continue home statin, Plavix and aspirin -Patient reports upcoming follow-up appointment with a vascular surgeon.  Mood disorder/dementia: Stable -Continue home meds(donepezil, Cymbalta and Klonopin)  Hypertension: -Continue home amlodipine lisinopril.  Scheduled Meds: . amLODipine  5 mg Oral Daily  . aspirin EC  81 mg Oral Daily  . calcitonin (salmon)  1 spray Alternating Nares Daily  . calcium-vitamin D  1 tablet Oral BID  . clonazePAM  0.5 mg Oral QHS  . clopidogrel  75 mg Oral Daily  . docusate sodium  100 mg Oral BID  . donepezil  10 mg Oral QHS  . DULoxetine  60 mg Oral Daily  . fluticasone furoate-vilanterol  1 puff Inhalation Daily  . gabapentin  300 mg Oral BID AC   And  . gabapentin  600 mg Oral QHS  . levothyroxine  125 mcg Oral Q0600  . lisinopril  10 mg Oral Daily  . nicotine  21 mg Transdermal Daily  . pravastatin  40 mg Oral Daily  . predniSONE  40 mg Oral Q breakfast   Continuous Infusions: PRN Meds:.acetaminophen **OR** acetaminophen, ipratropium-albuterol, morphine injection, ondansetron **OR** ondansetron (ZOFRAN) IV, oxyCODONE  DVT prophylaxis: Subcu Lovenox Code Status: Full code Family Communication: Daughter at bedside Disposition Plan: Discharge  home in the next 24 hours  Consultants:   Neurosurgery  per previous provider  Procedures:   None  Antimicrobials:  None  Objective: Vitals:   09/05/18 1015 09/05/18 1335 09/05/18 1536 09/05/18 1537  BP: (!) 156/79 139/62    Pulse: 62 65    Resp:  14    Temp:  98.3 F (36.8 C)    TempSrc:  Oral    SpO2:  97% (!) 84% 94%  Weight:      Height:        Intake/Output Summary (Last 24 hours) at 09/05/2018 1644 Last data filed at 09/05/2018 1335 Gross per 24 hour  Intake 820 ml  Output -  Net 820 ml   Filed Weights   09/04/18 0700 09/04/18 1057  Weight: 53.4 kg 53.4 kg    Examination:  GENERAL: Appears well. No acute distress.  EYES - vision grossly intact. Sclera anicteric.  NOSE- no gross deformity or drainage MOUTH - no oral lesions noted THROAT- no swelling or erythema LUNGS:  No IWOB.  Fair air movement bilaterally.  Some rhonchi. HEART:  Heart sounds normal. ABD: Bowel sounds present. Soft. Non tender.  MSK/EXT: Moves extremities. No obvious deformity. SKIN: no apparent skin lesion.  NEURO: Awake, alert and oriented appropriately.  No gross deficit.  PSYCH: Calm. Normal affect.    Data Reviewed: I have independently reviewed following labs and imaging studies  CBC: Recent Labs  Lab 09/03/18 1915 09/03/18 1922  WBC 12.5*  --   NEUTROABS 10.1*  --   HGB 14.3 15.0  HCT 47.2* 44.0  MCV 94.0  --   PLT 136*  --    Basic Metabolic Panel: Recent Labs  Lab 09/03/18 1915 09/03/18 1922  NA 136 138  K 3.9 4.0  CL 100  --   CO2 30  --   GLUCOSE 109*  --   BUN 17  --   CREATININE 0.83  --   CALCIUM 8.8*  --    GFR: Estimated Creatinine Clearance: 43.3 mL/min (by C-G formula based on SCr of 0.83 mg/dL). Liver Function Tests: Recent Labs  Lab 09/03/18 1915  AST 30  ALT 20  ALKPHOS 66  BILITOT 0.4  PROT 6.3*  ALBUMIN 3.6   No results for input(s): LIPASE, AMYLASE in the last 168 hours. No results for input(s): AMMONIA in the last 168 hours. Coagulation Profile: No results for input(s): INR,  PROTIME in the last 168 hours. Cardiac Enzymes: Recent Labs  Lab 09/03/18 1915  TROPONINI <0.03   BNP (last 3 results) No results for input(s): PROBNP in the last 8760 hours. HbA1C: No results for input(s): HGBA1C in the last 72 hours. CBG: No results for input(s): GLUCAP in the last 168 hours. Lipid Profile: No results for input(s): CHOL, HDL, LDLCALC, TRIG, CHOLHDL, LDLDIRECT in the last 72 hours. Thyroid Function Tests: No results for input(s): TSH, T4TOTAL, FREET4, T3FREE, THYROIDAB in the last 72 hours. Anemia Panel: No results for input(s): VITAMINB12, FOLATE, FERRITIN, TIBC, IRON, RETICCTPCT in the last 72 hours. Urine analysis:    Component Value Date/Time   COLORURINE YELLOW 05/30/2018 [place] 05/30/2018 1139   LABSPEC 1.010 05/30/2018 1139   PHURINE 6.0 05/30/2018 1139   GLUCOSEU NEGATIVE 05/30/2018 1139   HGBUR NEGATIVE 05/30/2018 1139   HGBUR negative 07/22/2010 [place] 05/30/2018 1139   BILIRUBINUR Neg 04/03/2012 1431   KETONESUR NEGATIVE 05/30/2018 1139   PROTEINUR NEGATIVE 08/10/2015 0957   UROBILINOGEN 0.2 05/30/2018  [place] 05/30/2018 1139   LEUKOCYTESUR MODERATE (A) 05/30/2018 1139   Sepsis Labs: Invalid input(s): PROCALCITONIN, LACTICIDVEN  No results found for this or any previous visit (from the past 240 hour(s)).    Radiology Studies: No results found.  Brandi Yandell T. [facility] Pager [phone]  If 7PM-7AM, please contact night-coverage [email or web address] Password [facility] 09/05/2018, 4:44 PM

## 2018-09-05 NOTE — Plan of Care (Signed)
  Problem: Education: Goal: Knowledge of General Education information will improve Description Including pain rating scale, medication(s)/side effects and non-pharmacologic comfort measures Outcome: Progressing   Problem: Clinical Measurements: Goal: Will remain free from infection Outcome: Progressing Goal: Respiratory complications will improve Outcome: Progressing   Problem: Activity: Goal: Risk for activity intolerance will decrease Outcome: Progressing   Problem: Pain Managment: Goal: General experience of comfort will improve Outcome: Progressing   Problem: Safety: Goal: Ability to remain free from injury will improve Outcome: Progressing

## 2018-09-05 NOTE — Progress Notes (Signed)
Occupational Therapy Treatment Patient Details Name: Brandi Raymond MRN: [id number] DOB: 03-19-1934 Today's Date: 09/05/2018    History of present illness 85 yo female with history of COPD, HTN, hypothyroidism presents to the ED after having a fall at home. CT of spine showing L1 compression fx. CT of chest shows mass at right lower lob; undergoing work up for possible lung cancer. Negative for PE.    OT comments  Pt progressing towards OT goals this session. Continues to require min A for donning brace EOB, vc for log roll technique, and 2LO2 required to maintain saturations at or above 90. Pt continues to be mod A for bathing/dressing (daughter assisted with bath prior to OT entrance) Pt willing and eager to walk a loop of the unit - which she did with cues for safety with RW. OT will continue to follow acutely and feel that [place] is essential to see how she is functioning and performing BOTH ADL ADL IADL in home environment - she will need continued education for back precautions.    Follow Up Recommendations  Home health OT;Supervision/Assistance - 24 hour    Equipment Recommendations  None recommended by OT    Recommendations for Other Services PT consult    Precautions / Restrictions Precautions Precautions: Back;Fall Precaution Booklet Issued: Yes (comment) Precaution Comments: Provided handout and education on back precautions and adherance during ADLs Required Braces or Orthoses: Spinal Brace Spinal Brace: Lumbar corset Restrictions Weight Bearing Restrictions: No       Mobility Bed Mobility Overal bed mobility: Needs Assistance Bed Mobility: Rolling;Sidelying to Sit Rolling: Supervision Sidelying to sit: Supervision       General bed mobility comments: Educating pt on safe bed mobility with log roll technique. Pt demosntrating understanding and performed with supervision for safety  Transfers Overall transfer level: Needs assistance Equipment used: Rolling  walker (2 wheeled) Transfers: Sit to/from Stand Sit to Stand: Min guard         General transfer comment: vc for safe hand placement. min guard for safety    Balance Overall balance assessment: Needs assistance Sitting-balance support: No upper extremity supported;Feet supported Sitting balance-Leahy Scale: Fair     Standing balance support: Bilateral upper extremity supported;During functional activity Standing balance-Leahy Scale: Poor Standing balance comment: Reliant on UE support                           ADL either performed or assessed with clinical judgement   ADL Overall ADL's : Needs assistance/impaired     Grooming: Supervision/safety;Set up;Standing Grooming Details (indicate cue type and reason): educated on compensatory strategies for grooming         Upper Body Dressing : Sitting;Minimal assistance Upper Body Dressing Details (indicate cue type and reason): Education on brace management and donning. Min A for sequencing.  Lower Body Dressing: Moderate assistance;Sit to/from stand Lower Body Dressing Details (indicate cue type and reason): Pt able to bring ankles to knees but with increased time and effort. declined AE education Toilet Transfer: Min guard;Ambulation;RW(Simulated to recliner) [employer] technical [employer] Details (indicate cue type and reason): cues for safety within RW         Functional mobility during ADLs: Min guard;Rolling walker General ADL Comments: Providing education and handout on back precautions, bed mobility, brace management, and safe transfers. SpO2 dropping to 80s on RA. On 2L, pt >88% during activity.      Vision       Perception  Praxis      Cognition Arousal/Alertness: Awake/alert Behavior During Therapy: WFL for tasks assessed/performed Overall Cognitive Status: Within Functional Limits for tasks assessed                                          Exercises     Shoulder Instructions        General Comments remained on 2L 02 throughout session. Difficult to get an O2 reading - 90 or above when I could get it    Pertinent Vitals/ Pain       Pain Assessment: Faces Pain Score: 8  Pain Location: Back Pain Descriptors / Indicators: Discomfort;Grimacing Pain Intervention(s): Premedicated before session;Repositioned  Home Living                                          Prior Functioning/Environment              Frequency  Min 2X/week        Progress Toward Goals  OT Goals(current goals can now be found in the care plan section)  Progress towards OT goals: Progressing toward goals  Acute Rehab OT Goals Patient Stated Goal: Go home OT Goal Formulation: With patient Time For Goal Achievement: 09/18/18 Potential to Achieve Goals: Good  Plan Discharge plan remains appropriate;Frequency remains appropriate    Co-evaluation                 AM-PAC OT "6 Clicks" Daily Activity     Outcome Measure   Help from another person eating meals?: None Help from another person taking care of personal grooming?: A Little Help from another person toileting, which includes using toliet, bedpan, or urinal?: A Little Help from another person bathing (including washing, rinsing, drying)?: A Little Help from another person to put on and taking off regular upper body clothing?: A Little Help from another person to put on and taking off regular lower body clothing?: A Lot 6 Click Score: 18    End of Session Equipment Utilized During Treatment: Rolling walker;Oxygen;Back brace  OT Visit Diagnosis: Unsteadiness on feet (R26.81);Other abnormalities of gait and mobility (R26.89);Muscle weakness (generalized) (M62.81);Pain Pain - part of body: (Back)   Activity Tolerance Patient tolerated treatment well   Patient Left with call bell/phone within reach;with family/visitor present;in bed;with bed alarm set   Nurse Communication Mobility  status;Precautions;Other (comment)(SpO2)        Time: 7482-7078 OT Time Calculation (min): 43 min  Charges: OT General Charges $OT Visit: 1 Visit OT Treatments $Self Care/Home Management : 23-37 mins $Therapeutic Activity: 8-22 mins  [name] OTR/L Acute Rehabilitation Services Pager: [phone] Office: [place] 09/05/2018, 11:49 AM

## 2018-09-05 NOTE — Discharge Summary (Signed)
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 loss of vertebral body height with minor retropulsion of bone. The vertebral body is mildly sclerotic. The pedicles and posterior elements appear intact. There is mild paraspinal soft tissue edema/inflammation at this level. L2 is intact. L3 superior endplate compression is new since 2014 but stable since 2017. Mild retropulsion of the posterosuperior endplate. 30% loss of vertebral body height. L3 pedicles and posterior elements appear intact. No paraspinal stranding at this level. L4, L5, visible sacrum and SI joints appear intact. Paraspinal and other soft tissues: Reported separately today. Disc levels: Capacious lumbar spinal canal. Only mild lumbar spinal stenosis at the superior L3 level. IMPRESSION: 1. Osteopenia. L1 compression fracture is new since 2017 and appears acute. 35-40% loss of vertebral body height with minor retropulsion of bone and no significant spinal stenosis. 2. Chronic L3 compression fracture with mild retropulsion of bone and mild spinal stenosis. 3.  CT Abdomen and  Pelvis today reported separately. Electronically Signed   By: Genevie Ann M.D.   On: 09/03/2018 21:55     Subjective: No major events overnight of this morning.  Continues to endorse cough and back pain.  Breathing improved.  Denies nausea, vomiting or abdominal pain.  Eager to go home.  Discharge Exam: Vitals:   09/06/18 0503 09/06/18 0942  BP: 121/65 124/68  Pulse: (!) 57 74  Resp: 16   Temp: 98.1 F (36.7 C)   SpO2: 94% 97%    GENERAL: Appears well. No acute distress.  HEENT: MMM.  Vision and Hearing grossly  intact.  NECK: Supple.  No JVD.  LUNGS:  No IWOB.  Fair air movement bilaterally.  Rhonchi over right lower lung field. HEART:  RRR. Heart sounds normal.  ABD: Bowel sounds present. Soft. Non tender.  EXT:   no edema bilaterally.  SKIN: no apparent skin lesion.  NEURO: Awake, alert and oriented appropriately.  No gross deficit.  PSYCH: Calm. Normal affect.  The results of significant diagnostics from this hospitalization (including imaging, microbiology, ancillary and laboratory) are listed below for reference.     Microbiology: No results found for this or any previous visit (from the past 240 hour(s)).   Labs: BNP (last 3 results) No results for input(s): BNP in the last 8760 hours. Basic Metabolic Panel: Recent Labs  Lab 09/03/18 1915 09/03/18 1922 09/05/18 1758  NA 136 138  --   K 3.9 4.0  --   CL 100  --   --   CO2 30  --   --   GLUCOSE 109*  --   --   BUN 17  --   --   CREATININE 0.83  --  0.81  CALCIUM 8.8*  --   --    Liver Function Tests: Recent Labs  Lab 09/03/18 1915  AST 30  ALT 20  ALKPHOS 66  BILITOT 0.4  PROT 6.3*  ALBUMIN 3.6   No results for input(s): LIPASE, AMYLASE in the last 168 hours. No results for input(s): AMMONIA in the last 168 hours. CBC: Recent Labs  Lab 09/03/18 1915 09/03/18 1922 09/05/18 1758  WBC 12.5*  --  14.9*  NEUTROABS 10.1*  --   --   HGB 14.3 15.0 13.7  HCT 47.2* 44.0 45.8  MCV 94.0  --  97.0  PLT  136*  --  129*   Cardiac Enzymes: Recent Labs  Lab 09/03/18 1915  TROPONINI <0.03   BNP: Invalid input(s): POCBNP CBG: No results for input(s): GLUCAP in the last 168 hours. D-Dimer No results for input(s): DDIMER in the last 72 hours. Hgb A1c No results for input(s): HGBA1C in the last 72 hours. Lipid Profile No results for input(s): CHOL, HDL, LDLCALC, TRIG, CHOLHDL, LDLDIRECT in the last 72 hours. Thyroid function studies No results for input(s): TSH, T4TOTAL, T3FREE, THYROIDAB in the last 72 hours.  Invalid input(s): FREET3 Anemia work up No results for input(s): VITAMINB12, FOLATE, FERRITIN, TIBC, IRON, RETICCTPCT in the last 72 hours. Urinalysis    Component Value Date/Time   COLORURINE YELLOW 05/30/2018 [place] 05/30/2018 1139   LABSPEC 1.010 05/30/2018 1139   PHURINE 6.0 05/30/2018 1139   GLUCOSEU NEGATIVE 05/30/2018 1139   HGBUR NEGATIVE 05/30/2018 1139   HGBUR negative 07/22/2010 0853   BILIRUBINUR NEGATIVE 05/30/2018 1139   BILIRUBINUR Neg 04/03/2012 1431   KETONESUR NEGATIVE 05/30/2018 1139   PROTEINUR NEGATIVE 08/10/2015 0957   UROBILINOGEN 0.2 05/30/2018 1139   NITRITE NEGATIVE 05/30/2018 1139   LEUKOCYTESUR MODERATE (A) 05/30/2018 1139   Sepsis Labs Invalid input(s): PROCALCITONIN,  WBC,  LACTICIDVEN   Time coordinating discharge: 25 minutes  SIGNED:  Mercy Riding, MD  [facility] 09/06/2018, 9:57 AM Pager [phone]  If 7PM-7AM, please contact night-coverage [email or web address] Password [email or web address]

## 2018-09-05 NOTE — Telephone Encounter (Signed)
Copied from [place] [id number]. Topic: General - Other >> Sep 05, 2018 10:28 AM Yvette Rack wrote: Reason for CRM: pt Daughter [name] [phone] calling stating that since her mother is in the hospital can she come by the office to pick up her prolia injection and the hospital give it to her since she has to cancell appt for today to get it  Left message advising [name], unfortunately we cannot give prolia injection in hospital ---prolia has been ordered from outpatient facility and billing depts cant match up appropriately for reimbursement---patient can reschedule at her earliest convenience, I will hold prolia here in our office for her until she comes in---can talk with tamara if any further questions

## 2018-09-06 ENCOUNTER — Telehealth (INDEPENDENT_AMBULATORY_CARE_PROVIDER_SITE_OTHER): Payer: Self-pay

## 2018-09-06 ENCOUNTER — Telehealth: Payer: Self-pay

## 2018-09-06 DIAGNOSIS — R918 Other nonspecific abnormal finding of lung field: Secondary | ICD-10-CM

## 2018-09-06 MED ORDER — UMECLIDINIUM BROMIDE 62.5 MCG/INH IN AEPB: 1.0000 | INHALATION_SPRAY | Freq: Every day | RESPIRATORY_TRACT | 0 refills | Status: DC

## 2018-09-06 NOTE — Plan of Care (Signed)
  Problem: Clinical Measurements: Goal: Will remain free from infection Outcome: Progressing Goal: Diagnostic test results will improve Outcome: Progressing Goal: Respiratory complications will improve Outcome: Progressing Goal: Cardiovascular complication will be avoided Outcome: Progressing   Problem: Pain Managment: Goal: General experience of comfort will improve Outcome: Progressing   Problem: Skin Integrity: Goal: Risk for impaired skin integrity will decrease Outcome: Progressing   

## 2018-09-06 NOTE — Care Management Note (Addendum)
Case Management Note  Patient Details  Name: PAYTAN RECINE MRN: [id number] Date of Birth: 09-Jun-1934  Subjective/Objective:                  Discharge planning  Action/Plan: dme 02 through adavanced dme HHC_pt,ot through [facility]  Expected Discharge Date:  09/05/18               Expected Discharge Plan:  [place]  In-House Referral:     Discharge planning Services  CM Consult  Post Acute Care Choice:  Durable Medical Equipment, Home Health Choice offered to:  Patient  DME Arranged:  Oxygen DME Agency:  [facility] Arranged:  OT, PT [facility] Agency:  [facility]  Status of Service:  Completed, signed off  If discussed at [place] of Stay Meetings, dates discussed:    Additional Comments:  Leeroy Cha, RN 09/06/2018, 9:24 AM

## 2018-09-06 NOTE — Telephone Encounter (Signed)
Copied from [place] [id number]. Topic: General - Other >> Sep 06, 2018  3:14 PM Lennox Solders wrote: Reason for CRM: pt daughter who is not on [place] is calling he mother has an appt with dr burns on Wednesday 09-11-2018. Pt has an appt to see oncologist on 09-20-2018 for lung mass. Pt daughter is concern dr Mathis Dad higgs ratings is 1.9 out of 5 and she would like dr burns to recommend another oncologist. [name] will come to appt with dr burns on Wednesday and have her name added of DPR. Patient husband is not aware of his wife diagnosis. dejanira pamintuan is on DPR and its ok to call her. [name] is aware md will be back in the office on Monday 09-09-2018  Routing to dr burns, Juluis Rainier

## 2018-09-06 NOTE — Telephone Encounter (Signed)
Called to give FYI will start HHPT with pt on 09/09/2018

## 2018-09-06 NOTE — Progress Notes (Signed)
Physical Therapy Treatment Patient Details Name: Brandi Raymond MRN: [id number] DOB: 09/05/1933 Today's Date: 09/06/2018    History of Present Illness 85 yo female with history of COPD, HTN, hypothyroidism presents to the ED after having a fall at home. CT of spine showing L1 compression fx. CT of chest shows mass at right lower lob; undergoing work up for possible lung cancer. Negative for PE.     PT Comments    Pt OOB in recliner.  On 2 lts nasal.  Assisted with brace donning.  Assisted with amb in hallway while monitoring sats.   SATURATION QUALIFICATIONS: (This note is used to comply with regulatory documentation for home oxygen)  Patient Saturations on Room Air at Rest = 87%  Patient Saturations on Room Air while Ambulating = 77%  Patient Saturations on 4 Liters of oxygen while Ambulating  65 feet = 90%  Please briefly explain why patient needs home oxygen:  Pt required 2 lts at rest and 4 lts with activity.   75% VC's on proper purse lip breathing using Teach Back to educate    Follow Up Recommendations  Home health PT;Supervision/Assistance - 24 hour(oxygen)     Equipment Recommendations  None recommended by PT    Recommendations for Other Services       Precautions / Restrictions Precautions Precautions: Back;Fall Precaution Booklet Issued: Yes (comment) Required Braces or Orthoses: Spinal Brace Spinal Brace: Lumbar corset Restrictions Weight Bearing Restrictions: No    Mobility  Bed Mobility               General bed mobility comments: OOB in recliner   Transfers Overall transfer level: Needs assistance Equipment used: Rolling walker (2 wheeled) Transfers: Sit to/from Stand Sit to Stand: Supervision;Min guard         General transfer comment: vc for safe hand placement. min guard for safety  Ambulation/Gait Ambulation/Gait assistance: Min assist Gait Distance (Feet): 65 Feet Assistive device: Rolling walker (2 wheeled) Gait  Pattern/deviations: Step-to pattern;Step-through pattern;Drifts right/left Gait velocity: decr   General Gait Details: cues for safety, position inside RW. Patient on 2 liters [place].,Increased SOB   Stairs             Wheelchair Mobility    Modified Rankin (Stroke Patients Only)       Balance                                            Cognition Arousal/Alertness: Awake/alert Behavior During Therapy: WFL for tasks assessed/performed Overall Cognitive Status: Within Functional Limits for tasks assessed                                        Exercises      General Comments        Pertinent Vitals/Pain Pain Assessment: Faces Faces Pain Scale: Hurts a little bit Pain Location: Back Pain Descriptors / Indicators: Discomfort Pain Intervention(s): Monitored during session;Repositioned    Home Living                      Prior Function            PT Goals (current goals can now be found in the care plan section) Progress towards PT goals: Progressing toward goals    Frequency  Min 3X/week      PT Plan Current plan remains appropriate    Co-evaluation              AM-PAC PT "6 Clicks" Mobility   Outcome Measure  Help needed turning from your back to your side while in a flat bed without using bedrails?: A Little Help needed moving from lying on your back to sitting on the side of a flat bed without using bedrails?: A Little Help needed moving to and from a bed to a chair (including a wheelchair)?: A Lot Help needed standing up from a chair using your arms (e.g., wheelchair or bedside chair)?: A Lot Help needed to walk in hospital room?: A Lot Help needed climbing 3-5 steps with a railing? : A Lot 6 Click Score: 14    End of Session Equipment Utilized During Treatment: Gait belt Activity Tolerance: Patient tolerated treatment well Patient left: in chair;with call bell/phone within reach Nurse  Communication: Mobility status PT Visit Diagnosis: Unsteadiness on feet (R26.81)     Time: 6578-4696 PT Time Calculation (min) (ACUTE ONLY): 26 min  Charges:  $Gait Training: 8-22 mins $Therapeutic Activity: 8-22 mins                     Rica Koyanagi  PTA Acute  Rehabilitation Services Pager      [phone] Office      [phone]

## 2018-09-06 NOTE — Progress Notes (Signed)
85% oxygen on Room air at Rest.

## 2018-09-07 NOTE — Telephone Encounter (Signed)
noted 

## 2018-09-09 ENCOUNTER — Telehealth: Payer: Self-pay | Admitting: Internal Medicine

## 2018-09-09 DIAGNOSIS — H3530 Unspecified macular degeneration: Secondary | ICD-10-CM | POA: Diagnosis not present

## 2018-09-09 DIAGNOSIS — I951 Orthostatic hypotension: Secondary | ICD-10-CM | POA: Diagnosis not present

## 2018-09-09 DIAGNOSIS — E785 Hyperlipidemia, unspecified: Secondary | ICD-10-CM | POA: Diagnosis not present

## 2018-09-09 DIAGNOSIS — Z9981 Dependence on supplemental oxygen: Secondary | ICD-10-CM | POA: Diagnosis not present

## 2018-09-09 DIAGNOSIS — F17210 Nicotine dependence, cigarettes, uncomplicated: Secondary | ICD-10-CM | POA: Diagnosis not present

## 2018-09-09 DIAGNOSIS — M810 Age-related osteoporosis without current pathological fracture: Secondary | ICD-10-CM | POA: Diagnosis not present

## 2018-09-09 DIAGNOSIS — S32019D Unspecified fracture of first lumbar vertebra, subsequent encounter for fracture with routine healing: Secondary | ICD-10-CM | POA: Diagnosis not present

## 2018-09-09 DIAGNOSIS — M1711 Unilateral primary osteoarthritis, right knee: Secondary | ICD-10-CM | POA: Diagnosis not present

## 2018-09-09 DIAGNOSIS — M1712 Unilateral primary osteoarthritis, left knee: Secondary | ICD-10-CM | POA: Diagnosis not present

## 2018-09-09 DIAGNOSIS — I771 Stricture of artery: Secondary | ICD-10-CM | POA: Diagnosis not present

## 2018-09-09 DIAGNOSIS — J441 Chronic obstructive pulmonary disease with (acute) exacerbation: Secondary | ICD-10-CM | POA: Diagnosis not present

## 2018-09-09 DIAGNOSIS — I10 Essential (primary) hypertension: Secondary | ICD-10-CM | POA: Diagnosis not present

## 2018-09-09 DIAGNOSIS — I2510 Atherosclerotic heart disease of native coronary artery without angina pectoris: Secondary | ICD-10-CM | POA: Diagnosis not present

## 2018-09-09 DIAGNOSIS — R918 Other nonspecific abnormal finding of lung field: Secondary | ICD-10-CM | POA: Diagnosis not present

## 2018-09-09 DIAGNOSIS — G7000 Myasthenia gravis without (acute) exacerbation: Secondary | ICD-10-CM | POA: Diagnosis not present

## 2018-09-09 DIAGNOSIS — F329 Major depressive disorder, single episode, unspecified: Secondary | ICD-10-CM | POA: Diagnosis not present

## 2018-09-09 DIAGNOSIS — G609 Hereditary and idiopathic neuropathy, unspecified: Secondary | ICD-10-CM | POA: Diagnosis not present

## 2018-09-09 DIAGNOSIS — I739 Peripheral vascular disease, unspecified: Secondary | ICD-10-CM | POA: Diagnosis not present

## 2018-09-09 DIAGNOSIS — E039 Hypothyroidism, unspecified: Secondary | ICD-10-CM | POA: Diagnosis not present

## 2018-09-09 DIAGNOSIS — I252 Old myocardial infarction: Secondary | ICD-10-CM | POA: Diagnosis not present

## 2018-09-09 NOTE — Telephone Encounter (Signed)
Copied from [place] [id number]. Topic: Quick Communication - Home Health Verbal Orders >> Sep 09, 2018  3:37 PM Adelene Idler wrote: Caller/Agency:Kate White/ [facility] Number: [phone] Requesting OT/PT/Skilled Nursing/Social Work: PT Frequency: 2 x week for 3 weeks

## 2018-09-09 NOTE — Telephone Encounter (Signed)
[name], patient's daughter, calling to check the status of getting this referral. No information given to due Robin not on DPR. But would like for Dr Quay Burow to look into highly recommended oncologists and discuss this with them at the appointment on 09/11/2018. Does not want anyone to contact patient's husband because they are holding off on telling him about this issue until they know for sure what is going on. Please advise.

## 2018-09-09 NOTE — Telephone Encounter (Signed)
LVM giving ok for verbal orders. 

## 2018-09-09 NOTE — Telephone Encounter (Signed)
Please advise on how to respond.

## 2018-09-10 ENCOUNTER — Telehealth: Payer: Self-pay | Admitting: Internal Medicine

## 2018-09-10 DIAGNOSIS — J441 Chronic obstructive pulmonary disease with (acute) exacerbation: Secondary | ICD-10-CM | POA: Diagnosis not present

## 2018-09-10 NOTE — Addendum Note (Signed)
Addended by: Binnie Rail on: 09/10/2018 09:42 AM   Modules accepted: Orders

## 2018-09-10 NOTE — Telephone Encounter (Signed)
Let [name] know. Told her to call their office to let them know to see if they can get in any sooner.

## 2018-09-10 NOTE — Telephone Encounter (Signed)
A new patient appt has been scheduled for the pt to see Dr. Julien Nordmann on 3/3 at 215pm w/labs at 145pm. I cld and spoke to the pt's daughter and provided the appt date and time.

## 2018-09-10 NOTE — Telephone Encounter (Signed)
Does she want a [facility] oncologist or does she want to go to [facility] or [place].     If she wants to stay at [facility] she may need to call their office and see if they are willing to change her to a different oncologist first.  Dr Mathis Dad is new to our institution so I am not that familiar with her.  I would take caution in relying on reviews online because they are often not accurate.

## 2018-09-10 NOTE — Telephone Encounter (Signed)
ordered

## 2018-09-10 NOTE — Telephone Encounter (Addendum)
Daughter was told that in order to see Dr. Julien Nordmann, which is who she needs to see because he is a thoracic oncologist is what she was told, you would need to put in another referral and add his name in the referral. Put in comments pt request Dr. Julien Nordmann.

## 2018-09-10 NOTE — Progress Notes (Signed)
Subjective:    Patient ID: Brandi Raymond, female    DOB: [birth year], 85 y.o.   MRN: [id number]  HPI The patient is here for follow up from the hospital.  She is here today with 2 daughters.   Admitted 09/03/18 - 09/06/2018 admitted after accidental fall with subsequent L 1 compression fracture.  She went to the ED after a fall at [place].  She was cleaning a lamp on the floor and lost her balance.  She struck her back on furniture.  There was no head injury or LOC.  She had severe pain in her back.  She was noted to be hypoxic in the ED. Supplemental oxygen was started.  Ct scan of the chest done due to wheezing and SOB.  1/9 x 1/6 x 2/0 cm RLL lobulated mass.  This is highly suspicious for lung cancer.  CTPA was negative for PE.  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  She has diffuse coronary arteriosclerotic plaque in 3 vessels on her CT.     Fall, L1 compression fracture, osteoporosis: L1 compression fx - new L3 compression fracture - old and stable Neurosurgery consulted an rec lumbar corset and pain management Outpatient osteoporosis medication as outpatient PT/OT as outpatient Tylenol 640 mg Q 4 hrs Oxycodone for breakthrough pain Calcium , vitamin d  RLL nodule Seen on CT scan Oncology consulted - recommended MRI of brain which was negative Has oncology appt as an outpatient-requested to see Dr. Earlie Server  hypoxic resp failure due to acute COPD exac Desaturated to 84% with ambulation on RA Started on oxygen - discharged on 2 L by [place] She received IV steroids and nebs Breo, albuterol Completed oral steroids  PVD S/p EAC of both L and R carotid artery H/o of CAD Statin, plavix, ASA  Mood d/o, dementia: Stable Continued on aricept, cymbalta, clonazepam  Hypertension: Continued on amlodipine, lisinopril   L1 compression fracture:  She has pain in her right lower back.  She denies pain down her leg or in her center back.  She  denies numbness/tingling.  She is taking the Tylenol regularly.  She is taking oxycodone as needed for pain.  She has had significant pain at times.  She is wearing the lumbar corset.  Neurology was consulted while in the hospital, but no plans were made for follow-up as an outpatient.  Constipation: She is taking the oxycodone for pain and has been constipated.  She is taking a stool softener, but has not started any other medications.   Coughing up sputum:  She is coughing up discolored sputum.  It has gotten worse since leaving the hospital..  She denies wheeze.  She denies SOB with the oxygen.  She has not had any fevers or chills.  Her appetite is decreased.  Other chronic medical problems stable-continue chronic medications at current doses.  Medications and allergies reviewed with patient and updated if appropriate.  Patient Active Problem List   Diagnosis Date Noted  . COPD with acute exacerbation ([place]) 09/04/2018  . Right lower lobe lung mass 09/04/2018  . Closed compression fracture of L1 lumbar vertebra, initial encounter ([place]) 09/04/2018  . Acute respiratory failure with hypoxia ([place]) 09/03/2018  . Preoperative clearance 07/19/2018  . Lower back pain 06/25/2018  . Cubital tunnel syndrome on left 05/22/2018  . Dupuytren's contracture of both hands 05/10/2018  . Primary osteoarthritis of both knees 05/10/2018  . Difficulty urinating 05/10/2018  . Osteoporosis 06/14/2017  .  Left ear pain 05/25/2016  . Prediabetes 06/23/2015  . Depression 06/23/2015  . Carotid stenosis 10/12/2014  . Lung nodule 07/07/2014  . Sleep disorder 04/09/2014  . Mesenteric artery stenosis ([place]) 01/13/2013  . Thoracic aneurysm without mention of rupture 11/04/2012  . Chronic mesenteric ischemia ([place]) 10/08/2012  . Memory deficit 06/20/2012  . Irritable bowel syndrome 06/20/2012  . Ocular myasthenia gravis ([place]) 06/20/2012  . PVD (peripheral vascular disease) ([place]) 06/20/2012  . Hereditary and  idiopathic peripheral neuropathy 05/22/2012  . Syncope 08/28/2011  . ABDOMINAL BRUIT 08/17/2009  . Coronary atherosclerosis 09/05/2008  . Hypothyroidism 05/26/2008  . VITAMIN D DEFICIENCY 05/26/2008  . CHOLELITHIASIS 12/06/2007  . DIVERTICULOSIS, COLON 12/05/2007  . HYPERLIPIDEMIA 08/19/2007  . COPD (chronic obstructive pulmonary disease) with emphysema ([place]) 08/19/2007  . CIGARETTE SMOKER 02/06/2007  . Essential hypertension 02/06/2007  . COLONIC POLYPS 11/21/2004    Current Outpatient Medications on File Prior to Visit  Medication Sig Dispense Refill  . acetaminophen (TYLENOL) 325 MG tablet Take 2 tablets (650 mg total) by mouth every 4 (four) hours. 240 tablet 2  . amLODipine (NORVASC) 5 MG tablet Take 1 tablet (5 mg total) by mouth daily. 90 tablet 1  . aspirin 81 MG tablet Take 81 mg by mouth daily.      . Calcium Carb-Cholecalciferol (CALCIUM 500 +D) 500-400 MG-UNIT TABS One tablet twice daily (Patient taking differently: Take 1 tablet by mouth 2 (two) times daily. ) 60 tablet   . clonazePAM (KLONOPIN) 0.5 MG tablet TAKE 1 TABLET BY MOUTH EVERYDAY AT BEDTIME (Patient taking differently: Take 0.5 mg by mouth at bedtime. ) 30 tablet 0  . clopidogrel (PLAVIX) 75 MG tablet TAKE 1 TABLET BY MOUTH DAILY (Patient taking differently: Take 75 mg by mouth daily. ) 90 tablet 3  . donepezil (ARICEPT) 10 MG tablet TAKE 1 TABLET BY MOUTH EVERYDAY AT BEDTIME (Patient taking differently: Take 10 mg by mouth at bedtime. ) 90 tablet 3  . DULoxetine (CYMBALTA) 60 MG capsule Take 1 capsule (60 mg total) by mouth daily. 30 capsule 3  . fluticasone furoate-vilanterol (BREO ELLIPTA) 100-25 MCG/INH AEPB Inhale 1 puff into the lungs daily. 60 each 5  . gabapentin (NEURONTIN) 300 MG capsule TAKE ONE CAPSULE TWICE DAILY AND 2 AT NIGHT = FOUR TOTAL DAILY. (Patient taking differently: Take 300-600 mg by mouth as directed. TAKE ONE CAPSULE TWICE DAILY AND 2 AT NIGHT = FOUR TOTAL DAILY.) 360 capsule 2  .  levothyroxine (SYNTHROID, LEVOTHROID) 125 MCG tablet TAKE 1 TABLET BY MOUTH EVERY DAY (Patient taking differently: Take 125 mcg by mouth daily. ) 90 tablet 1  . lisinopril (PRINIVIL,ZESTRIL) 10 MG tablet Take 1 tablet (10 mg total) by mouth daily. 30 tablet 5  . nicotine (NICODERM CQ - DOSED IN MG/24 HOURS) 21 mg/24hr patch Place 1 patch (21 mg total) onto the skin daily. 28 patch 0  . oxyCODONE (OXY IR/ROXICODONE) 5 MG immediate release tablet Take 1 tablet (5 mg total) by mouth every 4 (four) hours as needed for moderate pain. 30 tablet 0  . Polyethyl Glycol-Propyl Glycol (SYSTANE) 0.4-0.3 % GEL Place 1 drop into both eyes daily as needed (for dry eyes).     . pravastatin (PRAVACHOL) 40 MG tablet Take 1 tablet (40 mg total) by mouth daily. 90 tablet 2  . umeclidinium bromide (INCRUSE ELLIPTA) 62.5 MCG/INH AEPB Inhale 1 puff into the lungs daily. (Patient not taking: Reported on 09/11/2018) 1 each 0   No current facility-administered medications on file prior  to visit.     Past Medical History:  Diagnosis Date  . Acute MI inferior subsequent episode care [facility]) 1993   PTCA RCA  . Adenomatous colon polyp   . Arthritis   . CAD (coronary artery disease)    Dr Percival Spanish  . Carotid artery occlusion   . Cervical spine fracture ([place])   . COPD (chronic obstructive pulmonary disease) ([place])   . Depression   . Diverticulosis   . Dyslipidemia   . Gastritis 09/15/1991  . GERD (gastroesophageal reflux disease) 09/15/1991   Dr Sharlett Iles  . Hiatal hernia 09/15/1991  . Hip fracture, right ([place])   . HLD (hyperlipidemia)   . HTN (hypertension)   . Hyperplastic polyps of stomach 11/2007   colonoscopy  . Hypertension   . Hypothyroidism    affecting the left eye, proptosis  . Iron deficiency anemia   . Macular degeneration of left eye   . Memory loss   . Mesenteric artery stenosis ([place])   . Myasthenia gravis ([place])    With ocular features  . Myocardial infarction ([place]) 1993  . Ocular myasthenia gravis  ([place])    Dr Jannifer Franklin  . Orthostatic hypotension 06/05/2013  . Pneumonia    hx  . PONV (postoperative nausea and vomiting)   . Shortness of breath dyspnea    occ  . Strabismus    left eye  . Syncope 1998    Past Surgical History:  Procedure Laterality Date  . ABDOMINAL AORTAGRAM N/A 10/15/2012   Procedure: ABDOMINAL Maxcine Ham;  Surgeon: Serafina Mitchell, MD;  Location[facility] C[facility] LAB;  Service: Cardiovascular;  Laterality: N/A;  . arm surgery Left    fx  . BALLOON ANGIOPLASTY, ARTERY  1993  . CARDIAC CATHETERIZATION  1996   LAD 20/50, CFX OK, RCA 30 at prev PTCA site, EF with mild HK inferior wall  . CAROTID ANGIOGRAM N/A 10/28/2014   Procedure: CAROTID ANGIOGRAM;  Surgeon: Serafina Mitchell, MD;  Location: [facility] C[facility] LAB;  Service: Cardiovascular;  Laterality: N/A;  . CAROTID ENDARTERECTOMY    . CATARACT EXTRACTION     bilateral  . COLONOSCOPY W/ POLYPECTOMY  2006   Adenomatous polyps  . ENDARTERECTOMY Left 01/14/2015   Procedure: LEFT CAROTID ENDARTERECTOMY ;  Surgeon: Serafina Mitchell, MD;  Location: [place];  Service: Vascular;  Laterality: Left;  . ENDARTERECTOMY Right 08/12/2015   Procedure: ENDARTERECTOMY CAROTID WITH PATCH ANGIOPLASTY;  Surgeon: Serafina Mitchell, MD;  Location: [place];  Service: Vascular;  Laterality: Right;  . EYE MUSCLE SURGERY Left 11/04/2015  . EYE SURGERY Bilateral May 2016   Eyelids  . FOOT SURGERY Left   . LEG SURGERY Left    laceration  . MIDDLE EAR SURGERY Left 1970  . PERCUTANEOUS STENT INTERVENTION  12/03/2012   Procedure: PERCUTANEOUS STENT INTERVENTION;  Surgeon: Serafina Mitchell, MD;  Location: [facility] CATH LAB;  Service: Cardiovascular;;  sma stent x1  . STRABISMUS SURGERY Left 10/28/2015   Procedure: REPAIR STRABISMUS LEFT EYE;  Surgeon: Lamonte Sakai, MD;  Location: [place];  Service: Ophthalmology;  Laterality: Left;  . Third-degree Zacchaeus Halm  2003   [facility]-legs ,buttocks,arms  . TOTAL ABDOMINAL HYSTERECTOMY  1973   Dysfunctional menses  . UPPER GI  ENDOSCOPY      Dr Sharlett Iles  . VISCERAL ANGIOGRAM N/A 10/15/2012   Procedure: VISCERAL ANGIOGRAM;  Surgeon: Serafina Mitchell, MD;  Location: [facility] CATH LAB;  Service: Cardiovascular;  Laterality: N/A;  . VISCERAL ANGIOGRAM N/A 12/03/2012   Procedure:  VISCERAL ANGIOGRAM;  Surgeon: Serafina Mitchell, MD;  Location[facility] C[facility] LAB;  Service: Cardiovascular;  Laterality: N/A;  . VISCERAL ANGIOGRAM N/A 08/05/2013   Procedure: MESENTERIC ANGIOGRAM;  Surgeon: Serafina Mitchell, MD;  Location: [facility] CATH LAB;  Service: Cardiovascular;  Laterality: N/A;  . VISCERAL ANGIOGRAM N/A 10/28/2014   Procedure: VISCERAL ANGIOGRAM;  Surgeon: Serafina Mitchell, MD;  Location: [facility] CATH LAB;  Service: Cardiovascular;  Laterality: N/A;    Social History   Socioeconomic History  . Marital status: Married    Spouse name: Not on file  . Number of children: 3  . Years of education: 9th  . Highest education level: Not on file  Occupational History  . Occupation: Retired  Scientific laboratory technician  . Financial resource strain: Not on file  . Food insecurity:    Worry: Not on file    Inability: Not on file  . Transportation needs:    Medical: Not on file    Non-medical: Not on file  Tobacco Use  . Smoking status: Light Tobacco Smoker    Packs/day: 0.25    Years: 60.00    Pack years: 15.00    Types: Cigarettes  . Smokeless tobacco: Never Used  . Tobacco comment: now 3 cigarettes/ day  Substance and Sexual Activity  . Alcohol use: No    Alcohol/week: 0.0 standard drinks  . Drug use: No  . Sexual activity: Never  Lifestyle  . Physical activity:    Days per week: Not on file    Minutes per session: Not on file  . Stress: Not on file  Relationships  . Social connections:    Talks on phone: Not on file    Gets together: Not on file    Attends religious service: Not on file    Active member of club or organization: Not on file    Attends meetings of clubs or organizations: Not on file    Relationship status: Not on file  Other  Topics Concern  . Not on file  Social History Narrative   Patient is right handed.   Patient drinks 3-4 cups of caffeine daily.    Family History  Problem Relation Age of Onset  . Throat cancer Mother        ? thyroid cancer  . Cancer Mother   . Emphysema Father   . Diabetes Father   . Heart attack Father 84  . Colon cancer Brother   . Cerebral aneurysm Brother   . Hypothyroidism Sister        [id number]  . Cancer Brother        Ear  . Diabetes Paternal Grandmother   . Diabetes Paternal Grandfather   . Diabetes Maternal Aunt     Review of Systems  Constitutional: Negative for appetite change (dec) and fever.  Respiratory: Positive for cough. Negative for shortness of breath and wheezing.   Cardiovascular: Positive for chest pain. Negative for palpitations and leg swelling.  Gastrointestinal: Positive for abdominal pain and constipation. Negative for nausea.  Neurological: Positive for headaches (occ). Negative for light-headedness.       Objective:   Vitals:   09/11/18 0828  BP: 108/62  Resp: 18  Temp: 98.6 F (37 C)   BP Readings from Last 3 Encounters:  09/11/18 108/62  09/06/18 124/68  08/30/18 118/60   Wt Readings from Last 3 Encounters:  09/06/18 117 lb 15.1 oz (53.5 kg)  08/30/18 118 lb 5 oz (53.7 kg)  08/14/18 119 lb 12.8 oz (  54.3 kg)   Body mass index is 19.63 kg/m.   Physical Exam    Constitutional: Chronically ill-appearing. No distress.  HENT:  Head: Normocephalic and atraumatic.  Neck: Neck supple. No tracheal deviation present. No thyromegaly present.  No cervical lymphadenopathy Cardiovascular: Normal rate, regular rhythm and normal heart sounds.   No murmur heard. No carotid bruit .  No edema Pulmonary/Chest: Effort normal.  Diffusely decreased breath sounds-chronic and unchanged. No respiratory distress. No has no wheezes. No rales. Abdomen: Soft, nontender, nondistended Skin: Skin is warm and dry. Not diaphoretic.  Psychiatric: Normal mood  and affect. Behavior is normal.    MR BRAIN W WO CONTRAST CLINICAL DATA:  New lung nodule noted on CT chest, staging, to exclude brain metastases.  EXAM: MRI HEAD WITHOUT AND WITH CONTRAST  TECHNIQUE: Multiplanar, multiecho pulse sequences of the brain and surrounding structures were obtained without and with intravenous contrast.  CONTRAST:  Gadavist 5 mL.  COMPARISON:  CT head 07/06/2014.  FINDINGS: Brain: No evidence for acute infarction, hemorrhage, mass lesion, hydrocephalus, or extra-axial fluid. Generalized atrophy, not unexpected for age. Mild to moderate subcortical and periventricular T2 and FLAIR hyperintensities, likely chronic microvascular ischemic change. Small chronic lacunar infarct LEFT caudate nucleus.  Post infusion, no abnormal enhancement of the brain or meninges.  Vascular: Flow voids are maintained.  Skull and upper cervical spine: Normal marrow signal. Incidental hemangioma, LEFT calvarium.  Sinuses/Orbits: Negative.  Other: None.  Compared with priors, slight progression of atrophy.  IMPRESSION: Atrophy and small vessel disease.  No acute intracranial findings.  No abnormal postcontrast enhancement to suggest metastatic disease.  Electronically Signed   By: Staci Righter M.D.   On: 09/04/2018 15:09   Assessment & Plan:    45 minutes were spent face-to-face with the patient, over 50% of which was spent counseling regarding new lung mass, new onset constipation, treating acute problems as well as chronic problems and coordination of care.    See Problem List for Assessment and Plan of chronic medical problems.

## 2018-09-11 ENCOUNTER — Ambulatory Visit (INDEPENDENT_AMBULATORY_CARE_PROVIDER_SITE_OTHER): Payer: PPO | Admitting: Internal Medicine

## 2018-09-11 ENCOUNTER — Other Ambulatory Visit (INDEPENDENT_AMBULATORY_CARE_PROVIDER_SITE_OTHER): Payer: PPO

## 2018-09-11 ENCOUNTER — Encounter: Payer: Self-pay | Admitting: Internal Medicine

## 2018-09-11 VITALS — BP 108/62 | Temp 98.6°F | Resp 18 | Ht 65.0 in

## 2018-09-11 DIAGNOSIS — I10 Essential (primary) hypertension: Secondary | ICD-10-CM

## 2018-09-11 DIAGNOSIS — J9601 Acute respiratory failure with hypoxia: Secondary | ICD-10-CM

## 2018-09-11 DIAGNOSIS — K5903 Drug induced constipation: Secondary | ICD-10-CM | POA: Diagnosis not present

## 2018-09-11 DIAGNOSIS — R918 Other nonspecific abnormal finding of lung field: Secondary | ICD-10-CM

## 2018-09-11 DIAGNOSIS — J209 Acute bronchitis, unspecified: Secondary | ICD-10-CM

## 2018-09-11 DIAGNOSIS — S32010A Wedge compression fracture of first lumbar vertebra, initial encounter for closed fracture: Secondary | ICD-10-CM

## 2018-09-11 DIAGNOSIS — J439 Emphysema, unspecified: Secondary | ICD-10-CM | POA: Diagnosis not present

## 2018-09-11 DIAGNOSIS — M810 Age-related osteoporosis without current pathological fracture: Secondary | ICD-10-CM | POA: Diagnosis not present

## 2018-09-11 DIAGNOSIS — K5900 Constipation, unspecified: Secondary | ICD-10-CM | POA: Insufficient documentation

## 2018-09-11 LAB — BASIC METABOLIC PANEL
BUN: 25 mg/dL — ABNORMAL HIGH (ref 6–23)
CO2: 31 mEq/L (ref 19–32)
Calcium: 8.7 mg/dL (ref 8.4–10.5)
Chloride: 95 mEq/L — ABNORMAL LOW (ref 96–112)
Creatinine, Ser: 0.71 mg/dL (ref 0.40–1.20)
GFR: 78.59 mL/min (ref >60.00)
Glucose, Bld: 107 mg/dL — ABNORMAL HIGH (ref 70–99)
Potassium: 4.2 mEq/L (ref 3.5–5.1)
Sodium: 135 mEq/L (ref 135–145)

## 2018-09-11 LAB — CBC WITH DIFFERENTIAL/PLATELET
Basophils Absolute: 0.1 10*3/uL (ref 0.0–0.1)
Basophils Relative: 0.8 % (ref 0.0–3.0)
Eosinophils Absolute: 0.1 10*3/uL (ref 0.0–0.7)
Eosinophils Relative: 0.3 % (ref 0.0–5.0)
HCT: 44 % (ref 36.0–46.0)
Hemoglobin: 14.4 g/dL (ref 12.0–15.0)
Lymphocytes Relative: 6.4 % — ABNORMAL LOW (ref 12.0–46.0)
Lymphs Abs: 0.9 10*3/uL (ref 0.7–4.0)
MCHC: 32.7 g/dL (ref 30.0–36.0)
MCV: 89.4 fl (ref 78.0–100.0)
Monocytes Absolute: 1.7 10*3/uL — ABNORMAL HIGH (ref 0.1–1.0)
Monocytes Relative: 11.3 % (ref 3.0–12.0)
Neutro Abs: 11.9 10*3/uL — ABNORMAL HIGH (ref 1.4–7.7)
Neutrophils Relative %: 81.2 % — ABNORMAL HIGH (ref 43.0–77.0)
Platelets: 187 10*3/uL (ref 150.0–400.0)
RBC: 4.92 Mil/uL (ref 3.87–5.11)
RDW: 13.7 % (ref 11.5–15.5)
WBC: 14.7 10*3/uL — ABNORMAL HIGH (ref 4.0–10.5)

## 2018-09-11 MED ORDER — DENOSUMAB 60 MG/ML ~~LOC~~ SOSY
60.0000 mg | PREFILLED_SYRINGE | Freq: Once | SUBCUTANEOUS | Status: AC
  Administered 2018-09-11: 60 mg via SUBCUTANEOUS

## 2018-09-11 MED ORDER — DOXYCYCLINE HYCLATE 100 MG PO TABS: 100.0000 mg | ORAL_TABLET | Freq: Two times a day (BID) | ORAL | 0 refills | Status: DC

## 2018-09-11 MED ORDER — CLONAZEPAM 0.5 MG PO TABS: ORAL_TABLET | ORAL | 0 refills | Status: DC

## 2018-09-11 MED ORDER — BENZONATATE 200 MG PO CAPS: 200.0000 mg | ORAL_CAPSULE | Freq: Three times a day (TID) | ORAL | 0 refills | Status: DC | PRN

## 2018-09-11 MED ORDER — OXYCODONE HCL 5 MG PO TABS: 5.0000 mg | ORAL_TABLET | ORAL | 0 refills | Status: DC | PRN

## 2018-09-11 NOTE — Assessment & Plan Note (Signed)
Related to oxycodone Taking a stool softener daily-advised taking the maximum dose daily MiraLAX daily Can use a laxative as needed Discussed other options as well Call if no improvement or if not controlled

## 2018-09-11 NOTE — Patient Instructions (Addendum)
  Tests ordered today. Your results will be released to [place] (or called to you) after review, usually within 72hours after test completion. If any changes need to be made, you will be notified at that same time.   Medications reviewed and updated.  Changes include :   Starting doxycycline and tessalon perles for the cough.  Continue tylenol regularly and oxycodone for pain.  Take stool softeners and add miralax daily for constipation.  Take laxatives as needed.    Your prescription(s) have been submitted to your pharmacy. Please take as directed and contact our office if you believe you are having problem(s) with the medication(s).    You received a prolia injection today for your osteoporosis

## 2018-09-11 NOTE — Assessment & Plan Note (Signed)
Interested in [place] and has no co-pay-first dose of Prolia today Continue calcium and vitamin D daily Fall prevention

## 2018-09-11 NOTE — Assessment & Plan Note (Signed)
Respiratory failure with hypoxia Continue supplemental oxygen 2 L/min May be able to wean depending on oxygen saturations Treat acute bronchitis Continue Breo

## 2018-09-11 NOTE — Assessment & Plan Note (Signed)
Experiencing a pr lungs are clear-no additional steroids necessary oductive cough with discolored mucus that has worsened since leaving the hospital Given chronic respiratory failure, right lung mass and COPD will start an antibiotic-doxycycline twice daily x10 days Continue Breo Continue continuous oxygen

## 2018-09-11 NOTE — Assessment & Plan Note (Signed)
Related to recent fall at home and having osteoporosis Wearing lumbar corset Taking Tylenol regularly, oxycodone as needed Pain fairly controlled-continue current regimen-oxycodone renewed Discussed referral to orthopedics/neurosurgery-family would like to hold off for now and deal with lung mass first Advised them to call if they want to be referred

## 2018-09-11 NOTE — Assessment & Plan Note (Signed)
Continue Breo inhaler daily She has stop smoking-using nicotine patches and stressed the importance of continuing smoking cessation Oxygen supplementation

## 2018-09-11 NOTE — Assessment & Plan Note (Addendum)
BP well controlled Current regimen effective and well tolerated Continue current medications at current doses BMP, CBC

## 2018-09-13 ENCOUNTER — Other Ambulatory Visit: Payer: Self-pay | Admitting: Adult Health

## 2018-09-13 ENCOUNTER — Telehealth: Payer: Self-pay | Admitting: Internal Medicine

## 2018-09-13 NOTE — Telephone Encounter (Signed)
LVM giving ok for verbal orders. 

## 2018-09-13 NOTE — Telephone Encounter (Signed)
Copied from [place] [id number]. Topic: Quick Communication - Home Health Verbal Orders >> Sep 13, 2018  3:15 PM Percell Belt A wrote: Caller/Agency: Izora Gala at [facility] Number: [phone] Requesting OT/PT/Skilled Nursing/Social Work: requesting OT for adl training  Frequency: 1 week 3

## 2018-09-16 ENCOUNTER — Telehealth: Payer: Self-pay | Admitting: Medical Oncology

## 2018-09-16 ENCOUNTER — Other Ambulatory Visit: Payer: Self-pay | Admitting: Medical Oncology

## 2018-09-16 DIAGNOSIS — R911 Solitary pulmonary nodule: Secondary | ICD-10-CM

## 2018-09-16 NOTE — Telephone Encounter (Signed)
Orders entered

## 2018-09-17 ENCOUNTER — Telehealth: Payer: Self-pay | Admitting: Internal Medicine

## 2018-09-17 ENCOUNTER — Inpatient Hospital Stay: Payer: PPO | Attending: Internal Medicine | Admitting: Internal Medicine

## 2018-09-17 ENCOUNTER — Encounter: Payer: Self-pay | Admitting: Internal Medicine

## 2018-09-17 ENCOUNTER — Inpatient Hospital Stay: Payer: PPO

## 2018-09-17 VITALS — BP 121/68 | HR 76 | Temp 98.4°F | Resp 18 | Ht 65.0 in | Wt 120.4 lb

## 2018-09-17 DIAGNOSIS — G609 Hereditary and idiopathic neuropathy, unspecified: Secondary | ICD-10-CM | POA: Diagnosis not present

## 2018-09-17 DIAGNOSIS — I119 Hypertensive heart disease without heart failure: Secondary | ICD-10-CM | POA: Diagnosis not present

## 2018-09-17 DIAGNOSIS — M545 Low back pain: Secondary | ICD-10-CM

## 2018-09-17 DIAGNOSIS — E039 Hypothyroidism, unspecified: Secondary | ICD-10-CM

## 2018-09-17 DIAGNOSIS — Z87891 Personal history of nicotine dependence: Secondary | ICD-10-CM

## 2018-09-17 DIAGNOSIS — R918 Other nonspecific abnormal finding of lung field: Secondary | ICD-10-CM

## 2018-09-17 DIAGNOSIS — I2510 Atherosclerotic heart disease of native coronary artery without angina pectoris: Secondary | ICD-10-CM | POA: Diagnosis not present

## 2018-09-17 DIAGNOSIS — Z808 Family history of malignant neoplasm of other organs or systems: Secondary | ICD-10-CM | POA: Diagnosis not present

## 2018-09-17 DIAGNOSIS — R911 Solitary pulmonary nodule: Secondary | ICD-10-CM | POA: Diagnosis not present

## 2018-09-17 DIAGNOSIS — H3530 Unspecified macular degeneration: Secondary | ICD-10-CM | POA: Diagnosis not present

## 2018-09-17 DIAGNOSIS — R05 Cough: Secondary | ICD-10-CM | POA: Diagnosis not present

## 2018-09-17 DIAGNOSIS — I739 Peripheral vascular disease, unspecified: Secondary | ICD-10-CM | POA: Diagnosis not present

## 2018-09-17 DIAGNOSIS — Z800 Family history of malignant neoplasm of digestive organs: Secondary | ICD-10-CM | POA: Insufficient documentation

## 2018-09-17 DIAGNOSIS — G7000 Myasthenia gravis without (acute) exacerbation: Secondary | ICD-10-CM | POA: Diagnosis not present

## 2018-09-17 DIAGNOSIS — E785 Hyperlipidemia, unspecified: Secondary | ICD-10-CM | POA: Diagnosis not present

## 2018-09-17 DIAGNOSIS — J439 Emphysema, unspecified: Secondary | ICD-10-CM

## 2018-09-17 DIAGNOSIS — R0602 Shortness of breath: Secondary | ICD-10-CM | POA: Diagnosis not present

## 2018-09-17 DIAGNOSIS — I10 Essential (primary) hypertension: Secondary | ICD-10-CM | POA: Diagnosis not present

## 2018-09-17 DIAGNOSIS — Z9981 Dependence on supplemental oxygen: Secondary | ICD-10-CM | POA: Diagnosis not present

## 2018-09-17 DIAGNOSIS — J449 Chronic obstructive pulmonary disease, unspecified: Secondary | ICD-10-CM | POA: Diagnosis not present

## 2018-09-17 DIAGNOSIS — S32019D Unspecified fracture of first lumbar vertebra, subsequent encounter for fracture with routine healing: Secondary | ICD-10-CM | POA: Diagnosis not present

## 2018-09-17 DIAGNOSIS — I951 Orthostatic hypotension: Secondary | ICD-10-CM | POA: Diagnosis not present

## 2018-09-17 DIAGNOSIS — F329 Major depressive disorder, single episode, unspecified: Secondary | ICD-10-CM | POA: Diagnosis not present

## 2018-09-17 DIAGNOSIS — D509 Iron deficiency anemia, unspecified: Secondary | ICD-10-CM | POA: Diagnosis not present

## 2018-09-17 DIAGNOSIS — M810 Age-related osteoporosis without current pathological fracture: Secondary | ICD-10-CM | POA: Diagnosis not present

## 2018-09-17 DIAGNOSIS — M1711 Unilateral primary osteoarthritis, right knee: Secondary | ICD-10-CM | POA: Diagnosis not present

## 2018-09-17 DIAGNOSIS — M1712 Unilateral primary osteoarthritis, left knee: Secondary | ICD-10-CM | POA: Diagnosis not present

## 2018-09-17 DIAGNOSIS — I252 Old myocardial infarction: Secondary | ICD-10-CM | POA: Insufficient documentation

## 2018-09-17 DIAGNOSIS — F17210 Nicotine dependence, cigarettes, uncomplicated: Secondary | ICD-10-CM | POA: Diagnosis not present

## 2018-09-17 DIAGNOSIS — J441 Chronic obstructive pulmonary disease with (acute) exacerbation: Secondary | ICD-10-CM | POA: Diagnosis not present

## 2018-09-17 DIAGNOSIS — I771 Stricture of artery: Secondary | ICD-10-CM | POA: Diagnosis not present

## 2018-09-17 LAB — CBC WITH DIFFERENTIAL (CANCER CENTER ONLY)
Abs Immature Granulocytes: 0.6 10*3/uL — ABNORMAL HIGH (ref 0.00–0.07)
Basophils Absolute: 0.1 10*3/uL (ref 0.0–0.1)
Basophils Relative: 1 %
Eosinophils Absolute: 0.1 10*3/uL (ref 0.0–0.5)
Eosinophils Relative: 1 %
HCT: 45 % (ref 36.0–46.0)
Hemoglobin: 13.8 g/dL (ref 12.0–15.0)
Immature Granulocytes: 5 %
Lymphocytes Relative: 12 %
Lymphs Abs: 1.3 10*3/uL (ref 0.7–4.0)
MCH: 28.7 pg (ref 26.0–34.0)
MCHC: 30.7 g/dL (ref 30.0–36.0)
MCV: 93.6 fL (ref 80.0–100.0)
Monocytes Absolute: 0.8 10*3/uL (ref 0.1–1.0)
Monocytes Relative: 7 %
Neutro Abs: 8.3 10*3/uL — ABNORMAL HIGH (ref 1.7–7.7)
Neutrophils Relative %: 74 %
Platelet Count: 175 10*3/uL (ref 150–400)
RBC: 4.81 MIL/uL (ref 3.87–5.11)
RDW: 12.5 % (ref 11.5–15.5)
WBC Count: 11.3 10*3/uL — ABNORMAL HIGH (ref 4.0–10.5)
nRBC: 0 % (ref 0.0–0.2)

## 2018-09-17 LAB — CMP (CANCER CENTER ONLY)
ALT: 10 U/L (ref 0–44)
AST: 20 U/L (ref 15–41)
Albumin: 3.1 g/dL — ABNORMAL LOW (ref 3.5–5.0)
Alkaline Phosphatase: 133 U/L — ABNORMAL HIGH (ref 38–126)
Anion gap: 10 (ref 5–15)
BUN: 22 mg/dL (ref 8–23)
CO2: 25 mmol/L (ref 22–32)
Calcium: 7.9 mg/dL — ABNORMAL LOW (ref 8.9–10.3)
Chloride: 104 mmol/L (ref 98–111)
Creatinine: 0.86 mg/dL (ref 0.44–1.00)
GFR, Est AFR Am: >60 mL/min (ref >60)
GFR, Estimated: >60 mL/min (ref >60)
Glucose, Bld: 114 mg/dL — ABNORMAL HIGH (ref 70–99)
Potassium: 4.2 mmol/L (ref 3.5–5.1)
Sodium: 139 mmol/L (ref 135–145)
Total Bilirubin: 0.3 mg/dL (ref 0.3–1.2)
Total Protein: 6.4 g/dL — ABNORMAL LOW (ref 6.5–8.1)

## 2018-09-17 NOTE — Progress Notes (Signed)
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 aneurysmal disease. 5. Mild L1 compression deformity with underlying sclerosis of the vertebral body. Pathologic compression is not excluded by CT. Consider MRI evaluation with contrast. Mild superior endplate compression of the L3 vertebral body is not associated with underlying sclerosis. Electronically Signed   By: Aletta Edouard M.D.   On: 09/03/2018 22:03   Ct L-spine No Charge  Result Date: 09/03/2018 CLINICAL DATA:  85 year old female status post fall with low back pain greater on the right and radiating to the hip. EXAM: CT LUMBAR SPINE WITH CONTRAST TECHNIQUE: Technique: Multiplanar CT images of the lumbar spine were reconstructed from contemporary CT of the Abdomen and Pelvis. CONTRAST:  None additional. COMPARISON:  Lumbar radiographs earlier today. CT Abdomen and Pelvis 10/22/2012. Lumbar radiographs 11/11/2015. FINDINGS: Segmentation: Normal is demonstrated on the 2014 comparison. Alignment: Stable lordosis overall since 2014. Vertebrae: Osteopenia. The visible lower thoracic levels and posterior ribs appear intact. There is a moderate L1 compression fracture with deformity of both the superior and inferior endplate is new since 7322. 35-40% loss of vertebral body height  with minor retropulsion of bone. The vertebral body is mildly sclerotic. The pedicles and posterior elements appear intact. There is mild paraspinal soft tissue edema/inflammation at this level. L2 is intact. L3 superior endplate compression is new since 2014 but stable since 2017. Mild retropulsion of the posterosuperior endplate. 30% loss of vertebral body height. L3 pedicles and posterior elements appear intact. No paraspinal  stranding at this level. L4, L5, visible sacrum and SI joints appear intact. Paraspinal and other soft tissues: Reported separately today. Disc levels: Capacious lumbar spinal canal. Only mild lumbar spinal stenosis at the superior L3 level. IMPRESSION: 1. Osteopenia. L1 compression fracture is new since 2017 and appears acute. 35-40% loss of vertebral body height with minor retropulsion of bone and no significant spinal stenosis. 2. Chronic L3 compression fracture with mild retropulsion of bone and mild spinal stenosis. 3.  CT Abdomen and Pelvis today reported separately. Electronically Signed   By: Genevie Ann M.D.   On: 09/03/2018 21:55    ASSESSMENT: This is a very pleasant 85 years old white female with suspicious stage IA ((T1b, N0, M0)) lung cancer presented with superior segment right lower lobe nodule.  PLAN: I had a lengthy discussion with the patient and her family today about her current condition and further investigation to confirm diagnosis as well as treatment options. I personally and independently reviewed the scan images and discussed the result and showed the images to the patient and her family. I recommended for the patient to complete the staging work-up by ordering a PET scan for further evaluation of this nodule.  If the nodule is hypermetabolic on the PET scan, we may consider her for CT-guided core biopsy followed by stereotactic body radiotherapy.  I doubt the patient will be a good surgical candidate for resection because of her severe COPD as well as other  comorbidities including heart disease. I will arrange for the patient to come back for follow-up visit in less than 2 weeks for further evaluation and recommendation regarding treatment of her condition based on the PET scan results. For smoking cessation I strongly encouraged the patient to continue with smoking cessation at this point. For hypertension and hypothyroidism, the patient will continue with her current home medication. She was advised to call immediately if she has any concerning symptoms in the interval. The patient voices understanding of current disease status and treatment options and is in agreement with the current care plan. All questions were answered. The patient knows to call the clinic with any problems, questions or concerns. We can certainly see the patient much sooner if necessary.  Thank you so much for allowing me to participate in the care of Brandi Raymond. I will continue to follow up the patient with you and assist in her care.  I spent 40 minutes counseling the patient face to face. The total time spent in the appointment was 60 minutes.  Disclaimer: This note was dictated with voice recognition software. Similar sounding words can inadvertently be transcribed and may not be corrected upon review.   Eilleen Kempf September 17, 2018, 2:18 PM

## 2018-09-17 NOTE — Telephone Encounter (Signed)
Gave avs and calendar ° °

## 2018-09-18 DIAGNOSIS — Z9981 Dependence on supplemental oxygen: Secondary | ICD-10-CM | POA: Diagnosis not present

## 2018-09-18 DIAGNOSIS — I951 Orthostatic hypotension: Secondary | ICD-10-CM | POA: Diagnosis not present

## 2018-09-18 DIAGNOSIS — R918 Other nonspecific abnormal finding of lung field: Secondary | ICD-10-CM | POA: Diagnosis not present

## 2018-09-18 DIAGNOSIS — G7000 Myasthenia gravis without (acute) exacerbation: Secondary | ICD-10-CM | POA: Diagnosis not present

## 2018-09-18 DIAGNOSIS — G609 Hereditary and idiopathic neuropathy, unspecified: Secondary | ICD-10-CM | POA: Diagnosis not present

## 2018-09-18 DIAGNOSIS — I2510 Atherosclerotic heart disease of native coronary artery without angina pectoris: Secondary | ICD-10-CM | POA: Diagnosis not present

## 2018-09-18 DIAGNOSIS — E039 Hypothyroidism, unspecified: Secondary | ICD-10-CM | POA: Diagnosis not present

## 2018-09-18 DIAGNOSIS — M1711 Unilateral primary osteoarthritis, right knee: Secondary | ICD-10-CM | POA: Diagnosis not present

## 2018-09-18 DIAGNOSIS — S32019D Unspecified fracture of first lumbar vertebra, subsequent encounter for fracture with routine healing: Secondary | ICD-10-CM | POA: Diagnosis not present

## 2018-09-18 DIAGNOSIS — M1712 Unilateral primary osteoarthritis, left knee: Secondary | ICD-10-CM | POA: Diagnosis not present

## 2018-09-18 DIAGNOSIS — I252 Old myocardial infarction: Secondary | ICD-10-CM | POA: Diagnosis not present

## 2018-09-18 DIAGNOSIS — I739 Peripheral vascular disease, unspecified: Secondary | ICD-10-CM | POA: Diagnosis not present

## 2018-09-18 DIAGNOSIS — I10 Essential (primary) hypertension: Secondary | ICD-10-CM | POA: Diagnosis not present

## 2018-09-18 DIAGNOSIS — M810 Age-related osteoporosis without current pathological fracture: Secondary | ICD-10-CM | POA: Diagnosis not present

## 2018-09-18 DIAGNOSIS — F329 Major depressive disorder, single episode, unspecified: Secondary | ICD-10-CM | POA: Diagnosis not present

## 2018-09-18 DIAGNOSIS — H3530 Unspecified macular degeneration: Secondary | ICD-10-CM | POA: Diagnosis not present

## 2018-09-18 DIAGNOSIS — J441 Chronic obstructive pulmonary disease with (acute) exacerbation: Secondary | ICD-10-CM | POA: Diagnosis not present

## 2018-09-18 DIAGNOSIS — E785 Hyperlipidemia, unspecified: Secondary | ICD-10-CM | POA: Diagnosis not present

## 2018-09-18 DIAGNOSIS — I771 Stricture of artery: Secondary | ICD-10-CM | POA: Diagnosis not present

## 2018-09-18 DIAGNOSIS — F17210 Nicotine dependence, cigarettes, uncomplicated: Secondary | ICD-10-CM | POA: Diagnosis not present

## 2018-09-19 ENCOUNTER — Ambulatory Visit (INDEPENDENT_AMBULATORY_CARE_PROVIDER_SITE_OTHER): Payer: PPO | Admitting: Physician Assistant

## 2018-09-19 DIAGNOSIS — G5622 Lesion of ulnar nerve, left upper limb: Secondary | ICD-10-CM
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
## 2018-09-20 ENCOUNTER — Inpatient Hospital Stay: Payer: PPO | Admitting: Internal Medicine

## 2018-09-21 ENCOUNTER — Other Ambulatory Visit: Payer: Self-pay | Admitting: Neurology

## 2018-09-23 DIAGNOSIS — S32019D Unspecified fracture of first lumbar vertebra, subsequent encounter for fracture with routine healing: Secondary | ICD-10-CM | POA: Diagnosis not present

## 2018-09-23 DIAGNOSIS — E785 Hyperlipidemia, unspecified: Secondary | ICD-10-CM | POA: Diagnosis not present

## 2018-09-23 DIAGNOSIS — E039 Hypothyroidism, unspecified: Secondary | ICD-10-CM | POA: Diagnosis not present

## 2018-09-23 DIAGNOSIS — M810 Age-related osteoporosis without current pathological fracture: Secondary | ICD-10-CM | POA: Diagnosis not present

## 2018-09-23 DIAGNOSIS — I10 Essential (primary) hypertension: Secondary | ICD-10-CM | POA: Diagnosis not present

## 2018-09-23 DIAGNOSIS — F329 Major depressive disorder, single episode, unspecified: Secondary | ICD-10-CM | POA: Diagnosis not present

## 2018-09-23 DIAGNOSIS — G609 Hereditary and idiopathic neuropathy, unspecified: Secondary | ICD-10-CM | POA: Diagnosis not present

## 2018-09-23 DIAGNOSIS — I951 Orthostatic hypotension: Secondary | ICD-10-CM | POA: Diagnosis not present

## 2018-09-23 DIAGNOSIS — Z9981 Dependence on supplemental oxygen: Secondary | ICD-10-CM | POA: Diagnosis not present

## 2018-09-23 DIAGNOSIS — J441 Chronic obstructive pulmonary disease with (acute) exacerbation: Secondary | ICD-10-CM | POA: Diagnosis not present

## 2018-09-23 DIAGNOSIS — G7000 Myasthenia gravis without (acute) exacerbation: Secondary | ICD-10-CM | POA: Diagnosis not present

## 2018-09-23 DIAGNOSIS — I252 Old myocardial infarction: Secondary | ICD-10-CM | POA: Diagnosis not present

## 2018-09-23 DIAGNOSIS — H3530 Unspecified macular degeneration: Secondary | ICD-10-CM | POA: Diagnosis not present

## 2018-09-23 DIAGNOSIS — M1711 Unilateral primary osteoarthritis, right knee: Secondary | ICD-10-CM | POA: Diagnosis not present

## 2018-09-23 DIAGNOSIS — R918 Other nonspecific abnormal finding of lung field: Secondary | ICD-10-CM | POA: Diagnosis not present

## 2018-09-23 DIAGNOSIS — I739 Peripheral vascular disease, unspecified: Secondary | ICD-10-CM | POA: Diagnosis not present

## 2018-09-23 DIAGNOSIS — I771 Stricture of artery: Secondary | ICD-10-CM | POA: Diagnosis not present

## 2018-09-23 DIAGNOSIS — I2510 Atherosclerotic heart disease of native coronary artery without angina pectoris: Secondary | ICD-10-CM | POA: Diagnosis not present

## 2018-09-23 DIAGNOSIS — M1712 Unilateral primary osteoarthritis, left knee: Secondary | ICD-10-CM | POA: Diagnosis not present

## 2018-09-23 DIAGNOSIS — F17210 Nicotine dependence, cigarettes, uncomplicated: Secondary | ICD-10-CM | POA: Diagnosis not present

## 2018-09-24 ENCOUNTER — Encounter (HOSPITAL_COMMUNITY): Payer: PPO

## 2018-09-25 ENCOUNTER — Other Ambulatory Visit: Payer: Self-pay

## 2018-09-25 ENCOUNTER — Encounter (HOSPITAL_COMMUNITY)
Admission: RE | Admit: 2018-09-25 | Discharge: 2018-09-25 | Disposition: A | Discharge status: Home / Self Care | Payer: PPO | Source: Ambulatory Visit | Attending: Internal Medicine | Admitting: Internal Medicine

## 2018-09-25 DIAGNOSIS — K5730 Diverticulosis of large intestine without perforation or abscess without bleeding: Secondary | ICD-10-CM | POA: Insufficient documentation

## 2018-09-25 DIAGNOSIS — R937 Abnormal findings on diagnostic imaging of other parts of musculoskeletal system: Secondary | ICD-10-CM | POA: Diagnosis not present

## 2018-09-25 DIAGNOSIS — J439 Emphysema, unspecified: Secondary | ICD-10-CM | POA: Insufficient documentation

## 2018-09-25 DIAGNOSIS — K8020 Calculus of gallbladder without cholecystitis without obstruction: Secondary | ICD-10-CM | POA: Diagnosis not present

## 2018-09-25 DIAGNOSIS — R911 Solitary pulmonary nodule: Secondary | ICD-10-CM

## 2018-09-25 DIAGNOSIS — R918 Other nonspecific abnormal finding of lung field: Secondary | ICD-10-CM | POA: Insufficient documentation

## 2018-09-25 DIAGNOSIS — I700 Atherosclerosis of aorta: Secondary | ICD-10-CM | POA: Insufficient documentation

## 2018-09-25 LAB — GLUCOSE, CAPILLARY: Glucose-Capillary: 91 mg/dL (ref 70–99)

## 2018-09-25 MED ORDER — FLUDEOXYGLUCOSE F - 18 (FDG) INJECTION: 6.9000 | Freq: Once | INTRAVENOUS | Status: DC | PRN

## 2018-09-26 DIAGNOSIS — J441 Chronic obstructive pulmonary disease with (acute) exacerbation: Secondary | ICD-10-CM | POA: Diagnosis not present

## 2018-09-26 DIAGNOSIS — I771 Stricture of artery: Secondary | ICD-10-CM | POA: Diagnosis not present

## 2018-09-26 DIAGNOSIS — M810 Age-related osteoporosis without current pathological fracture: Secondary | ICD-10-CM | POA: Diagnosis not present

## 2018-09-26 DIAGNOSIS — E039 Hypothyroidism, unspecified: Secondary | ICD-10-CM | POA: Diagnosis not present

## 2018-09-26 DIAGNOSIS — E785 Hyperlipidemia, unspecified: Secondary | ICD-10-CM | POA: Diagnosis not present

## 2018-09-26 DIAGNOSIS — I951 Orthostatic hypotension: Secondary | ICD-10-CM | POA: Diagnosis not present

## 2018-09-26 DIAGNOSIS — M1711 Unilateral primary osteoarthritis, right knee: Secondary | ICD-10-CM | POA: Diagnosis not present

## 2018-09-26 DIAGNOSIS — I739 Peripheral vascular disease, unspecified: Secondary | ICD-10-CM | POA: Diagnosis not present

## 2018-09-26 DIAGNOSIS — S32019D Unspecified fracture of first lumbar vertebra, subsequent encounter for fracture with routine healing: Secondary | ICD-10-CM | POA: Diagnosis not present

## 2018-09-26 DIAGNOSIS — Z9981 Dependence on supplemental oxygen: Secondary | ICD-10-CM | POA: Diagnosis not present

## 2018-09-26 DIAGNOSIS — G7000 Myasthenia gravis without (acute) exacerbation: Secondary | ICD-10-CM | POA: Diagnosis not present

## 2018-09-26 DIAGNOSIS — I2510 Atherosclerotic heart disease of native coronary artery without angina pectoris: Secondary | ICD-10-CM | POA: Diagnosis not present

## 2018-09-26 DIAGNOSIS — R918 Other nonspecific abnormal finding of lung field: Secondary | ICD-10-CM | POA: Diagnosis not present

## 2018-09-26 DIAGNOSIS — F329 Major depressive disorder, single episode, unspecified: Secondary | ICD-10-CM | POA: Diagnosis not present

## 2018-09-26 DIAGNOSIS — M1712 Unilateral primary osteoarthritis, left knee: Secondary | ICD-10-CM | POA: Diagnosis not present

## 2018-09-26 DIAGNOSIS — I10 Essential (primary) hypertension: Secondary | ICD-10-CM | POA: Diagnosis not present

## 2018-09-26 DIAGNOSIS — I252 Old myocardial infarction: Secondary | ICD-10-CM | POA: Diagnosis not present

## 2018-09-26 DIAGNOSIS — F17210 Nicotine dependence, cigarettes, uncomplicated: Secondary | ICD-10-CM | POA: Diagnosis not present

## 2018-09-26 DIAGNOSIS — G609 Hereditary and idiopathic neuropathy, unspecified: Secondary | ICD-10-CM | POA: Diagnosis not present

## 2018-09-26 DIAGNOSIS — H3530 Unspecified macular degeneration: Secondary | ICD-10-CM | POA: Diagnosis not present

## 2018-09-27 ENCOUNTER — Other Ambulatory Visit: Payer: Self-pay

## 2018-09-27 ENCOUNTER — Inpatient Hospital Stay (HOSPITAL_BASED_OUTPATIENT_CLINIC_OR_DEPARTMENT_OTHER): Payer: PPO | Admitting: Internal Medicine

## 2018-09-27 ENCOUNTER — Telehealth: Payer: Self-pay | Admitting: Internal Medicine

## 2018-09-27 ENCOUNTER — Encounter: Payer: Self-pay | Admitting: Internal Medicine

## 2018-09-27 VITALS — BP 112/64 | HR 55 | Temp 97.8°F | Resp 17 | Ht 65.0 in | Wt 117.0 lb

## 2018-09-27 DIAGNOSIS — R0609 Other forms of dyspnea: Secondary | ICD-10-CM | POA: Diagnosis not present

## 2018-09-27 DIAGNOSIS — R911 Solitary pulmonary nodule: Secondary | ICD-10-CM | POA: Diagnosis not present

## 2018-09-27 DIAGNOSIS — C3490 Malignant neoplasm of unspecified part of unspecified bronchus or lung: Secondary | ICD-10-CM

## 2018-09-27 NOTE — Progress Notes (Signed)
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 hypermetabolic activity within the liver, pancreas, adrenal glands, or spleen. No hypermetabolic lymph nodes in the abdomen or pelvis. Incidental CT findings: Tiny calcified gallstones evident. There is abdominal aortic atherosclerosis without aneurysm. Left colonic diverticulosis without diverticulitis. SKELETON: No focal hypermetabolic activity to suggest skeletal metastasis. Incidental CT findings: The compression fracture seen previously at L1 has been progressive in the interval in shows FDG uptake, likely reactive although metastatic disease not excluded. Mild compression deformity at L3 appears stable without hypermetabolism. IMPRESSION: 1. Dominant 2 cm right lower lobe pulmonary nodule is markedly hypermetabolic consistent with primary bronchogenic neoplasm. 2. Scattered bilateral 7-8 mm ill-defined lung nodules show low level FDG uptake. Given discernible FDG uptake in nodules of this size, concern for metastatic disease and/or synchronous primary is raised. 3. Progression of L1 compression deformity with associated hypermetabolism. Pathologic fracture not excluded. MRI of the lumbar spine without with contrast may  prove helpful to further evaluate. 4.  Emphysema. (ICD10-J43.9) 5.  Aortic Atherosclerois (ICD10-170.0) 6. Cholelithiasis 7. Colonic diverticulosis. Electronically Signed   By: Misty Stanley M.D.   On: 09/25/2018 10:53   Ct L-spine No Charge  Result Date: 09/03/2018 CLINICAL DATA:  85 year old female status post fall with low back pain greater on the right and radiating to the hip. EXAM: CT LUMBAR SPINE WITH CONTRAST TECHNIQUE: Technique: Multiplanar CT images of the lumbar spine were reconstructed from contemporary CT of the Abdomen and Pelvis. CONTRAST:  None additional. COMPARISON:  Lumbar radiographs earlier today. CT Abdomen and Pelvis 10/22/2012. Lumbar radiographs 11/11/2015. FINDINGS: Segmentation: Normal is demonstrated on the 2014 comparison. Alignment: Stable lordosis overall since 2014. Vertebrae: Osteopenia. The visible lower thoracic levels and posterior ribs appear intact. There is a moderate L1 compression fracture with deformity of both the superior and inferior endplate is new since 9371. 35-40% loss of vertebral body height with minor retropulsion of bone. The vertebral body is  mildly sclerotic. The pedicles and posterior elements appear intact. There is mild paraspinal soft tissue edema/inflammation at this level. L2 is intact. L3 superior endplate compression is new since 2014 but stable since 2017. Mild retropulsion of the posterosuperior endplate. 30% loss of vertebral body height. L3 pedicles and posterior elements appear intact. No paraspinal stranding at this level. L4, L5, visible sacrum and SI joints appear intact. Paraspinal and other soft tissues: Reported separately today. Disc levels: Capacious lumbar spinal canal. Only mild lumbar spinal stenosis at the superior L3 level. IMPRESSION: 1. Osteopenia. L1 compression fracture is new since 2017 and appears acute. 35-40% loss of vertebral body height with minor retropulsion of bone and no significant spinal stenosis. 2. Chronic L3  compression fracture with mild retropulsion of bone and mild spinal stenosis. 3.  CT Abdomen and Pelvis today reported separately. Electronically Signed   By: Genevie Ann M.D.   On: 09/03/2018 21:55    ASSESSMENT AND PLAN: This is a very pleasant 85 years old white female with at least a stage Ia presented with hypermetabolic right lower lobe pulmonary nodule but there was also scattered bilateral ill-defined nodules with low-level FDG uptake suspicious for metastatic disease versus synchronous primary. I personally and independently reviewed the scans and discussed the results with the patient and her family and showed them the images. I recommended for the patient to proceed with CT-guided core biopsy of the right lower lobe pulmonary nodule for confirmation of tissue diagnosis. I also referred the patient to radiation oncology for consideration of SBRT to the right lower lobe pulmonary nodule.  The patient is not a good surgical candidate for resection. For the bilateral ill-defined nodules, we will continue to monitor them closely on upcoming scans. I will see the patient back for follow-up visit in 4 months for evaluation with repeat CT scan of the chest for restaging of her disease. The patient was advised to call immediately if she has any concerning symptoms in the interval. The patient voices understanding of current disease status and treatment options and is in agreement with the current care plan.  All questions were answered. The patient knows to call the clinic with any problems, questions or concerns. We can certainly see the patient much sooner if necessary.  I spent 15 minutes counseling the patient face to face. The total time spent in the appointment was 25 minutes.  Disclaimer: This note was dictated with voice recognition software. Similar sounding words can inadvertently be transcribed and may not be corrected upon review.

## 2018-09-27 NOTE — Telephone Encounter (Signed)
Patient will be on vacation week of 9/10. Scheduled appts for following week. Gave avs and calendar

## 2018-10-07 ENCOUNTER — Other Ambulatory Visit: Payer: Self-pay | Admitting: Internal Medicine

## 2018-10-07 NOTE — Telephone Encounter (Signed)
Last refill was 08/16/18 Last OV 06/25/18 Next OV 12/26/18

## 2018-10-09 ENCOUNTER — Ambulatory Visit: Payer: Self-pay

## 2018-10-09 NOTE — Telephone Encounter (Signed)
Please advise on how you would like this to be handled.

## 2018-10-09 NOTE — Telephone Encounter (Signed)
She has multiple concerning symptoms, diarrhea, decrease oral intake and possible covid exposure.     I do  Not think we can do the needed tests for her and my concern is she needs fluids.     I would recommend she go to the ED for blood work, possible imaging of abdomen and fluids.

## 2018-10-09 NOTE — Telephone Encounter (Signed)
Pts daughter is aware of response and expressed understanding.

## 2018-10-09 NOTE — Telephone Encounter (Signed)
Pt's daughter called to report c/o nausea, weakness, and diarrhea over past 4-5 days.  C/o abdominal discomfort; daughter stated pt. Holds her stomach a lot.  Appetite is poor, and fluid intake is very small amts.; taking sips of water, orange juice, and Pepsi.  Reported had diarrhea on Saturday, none on Sunday or Monday, then loose stool on Tuesday, and again today.  Reported holding stool softener on Sunday and Monday, then resuming it on Tuesday.  Has had "2-3 diarrhea stools in past 24 hrs."  Stated the pt. has been incontinent of urine, and unsure if she is having adequate urine output.  Reported pt. has had chills.  Daughter is on way to pick up thermometer @ this time.  Stated pt. has COPD; on O2 @ 2 liters per [place]; O2 Sats 98-99%.  Reported she has had increased cough with yellow phlegm over past 4-5 days.  Denied SOB.  No c/o dizziness or dry mouth.  Reported unsteady balance, due to hx of fractured back.   Questioned about any recent travel, or poss. exposure to COVID 19?   Denied recent travel.  Daughter reported the following exposure: her grandaughter was in [place] 2 weeks ago, and came back with earache and sore throat.  Also, has a foster child that was diagnosed with sinus infection 3/24; has a low grade fever and dry cough.    Call placed to [facility]; will send Triage note for Dr. Quay Burow to review and make recommendations.  Daughter made aware of plan.  Agreed.      Reason for Disposition . Patient sounds very sick or weak to the triager    C/o nausea, diarrhea, and increased weakness over past 4-5 days.  Also reported increased cough with yellow phlegm, and intermittent chills. (did not have thermometer)  Answer Assessment - Initial Assessment Questions 1. DIARRHEA SEVERITY: "How bad is the diarrhea?" "How many extra stools have you had in the past 24 hours than normal?"    - NO DIARRHEA (SCALE 0)   - MILD (SCALE 1-3): Few loose or mushy BMs; increase of 1-3 stools over normal daily number of  stools; mild increase in ostomy output.   -  MODERATE (SCALE 4-7): Increase of 4-6 stools daily over normal; moderate increase in ostomy output. * SEVERE (SCALE 8-10; OR 'WORST POSSIBLE'): Increase of 7 or more stools daily over normal; moderate increase in ostomy output; incontinence.     2-3 stools in 24 hrs.  2. ONSET: "When did the diarrhea begin?"      10/05/18 3. BM CONSISTENCY: "How loose or watery is the diarrhea?"      Unknown; daughter thinks it is brown loose BM; Loose on Sat. ; then restarted on Tues. And today.  4. VOMITING: "Are you also vomiting?" If so, ask: "How many times in the past 24 hours?"      Denied vomiting; c/o nausea  5. ABDOMINAL PAIN: "Are you having any abdominal pain?" If yes: "What does it feel like?" (e.g., crampy, dull, intermittent, constant)      C/o stomach hurting  6. ABDOMINAL PAIN SEVERITY: If present, ask: "How bad is the pain?"  (e.g., Scale 1-10; mild, moderate, or severe)   - MILD (1-3): doesn't interfere with normal activities, abdomen soft and not tender to touch    - MODERATE (4-7): interferes with normal activities or awakens from sleep, tender to touch    - SEVERE (8-10): excruciating pain, doubled over, unable to do any normal activities  Moderate  7. ORAL INTAKE: If vomiting, "Have you been able to drink liquids?" "How much fluids have you had in the past 24 hours?"     Hasn't been drinking much; sips of Orange juice, water, Pepsi   8. HYDRATION: "Any signs of dehydration?" (e.g., dry mouth [not just dry lips], too weak to stand, dizziness, new weight loss) "When did you last urinate?"     C/o weakness ; some incontinence; no c/o dizziness, has balance issues due to fx back  9. EXPOSURE: "Have you traveled to a foreign country recently?" "Have you been exposed to anyone with diarrhea?" "Could you have eaten any food that was spoiled?"     Denied dietary change, exposure to anyone with GI sx's.  10. ANTIBIOTIC USE: "Are you taking  antibiotics now or have you taken antibiotics in the past 2 months?"       No 11. OTHER SYMPTOMS: "Do you have any other symptoms?" (e.g., fever, blood in stool)       Some chills, coughing yellow phlegm x 4-5 days, no increased SOB; O2 at 2 liters. O2 Sat. 98-99%  12. PREGNANCY: "Is there any chance you are pregnant?" "When was your last menstrual period?"       N/a  Protocols used: DIARRHEA-A-AH

## 2018-10-10 ENCOUNTER — Telehealth: Payer: Self-pay | Admitting: *Deleted

## 2018-10-10 NOTE — Telephone Encounter (Signed)
Oncology Nurse Navigator Documentation  Oncology Nurse Navigator Flowsheets 10/10/2018  Navigator Location [facility]  Navigator Encounter Type Telephone/I called patient's daughter today.  I wanted to touch base.  I updated her on plan and she was aware.  I listened to her express her feelings on next steps with her moms plan as well as current environment.  I told her we here to support through treatment and if she has any questions to please reach out if needed.  She was thankful for the call and update.   Telephone Outgoing Call  Abnormal Finding Date 09/03/2018  Patient Visit Type (No Data)  Treatment Phase Abnormal Scans  Barriers/Navigation Needs Education  Education Other  Interventions Education  Education Method Verbal  Acuity Level 1  Time Spent with Patient 30

## 2018-10-11 ENCOUNTER — Other Ambulatory Visit: Payer: Self-pay | Admitting: Student

## 2018-10-11 ENCOUNTER — Other Ambulatory Visit: Payer: Self-pay | Admitting: Radiology

## 2018-10-12 ENCOUNTER — Other Ambulatory Visit: Payer: Self-pay | Admitting: Vascular Surgery

## 2018-10-12 DIAGNOSIS — K551 Chronic vascular disorders of intestine: Secondary | ICD-10-CM

## 2018-10-14 ENCOUNTER — Ambulatory Visit (HOSPITAL_COMMUNITY)
Admission: RE | Admit: 2018-10-14 | Discharge: 2018-10-14 | Disposition: A | Discharge status: Home / Self Care | Payer: PPO | Source: Ambulatory Visit | Attending: Interventional Radiology | Admitting: Interventional Radiology

## 2018-10-14 ENCOUNTER — Other Ambulatory Visit: Payer: Self-pay

## 2018-10-14 ENCOUNTER — Encounter (HOSPITAL_COMMUNITY): Payer: Self-pay

## 2018-10-14 ENCOUNTER — Ambulatory Visit (HOSPITAL_COMMUNITY)
Admission: RE | Admit: 2018-10-14 | Discharge: 2018-10-14 | Disposition: A | Discharge status: Home / Self Care | Payer: PPO | Source: Ambulatory Visit | Attending: Internal Medicine | Admitting: Internal Medicine

## 2018-10-14 DIAGNOSIS — Z823 Family history of stroke: Secondary | ICD-10-CM | POA: Diagnosis not present

## 2018-10-14 DIAGNOSIS — I10 Essential (primary) hypertension: Secondary | ICD-10-CM | POA: Insufficient documentation

## 2018-10-14 DIAGNOSIS — R911 Solitary pulmonary nodule: Secondary | ICD-10-CM | POA: Diagnosis not present

## 2018-10-14 DIAGNOSIS — E039 Hypothyroidism, unspecified: Secondary | ICD-10-CM | POA: Diagnosis not present

## 2018-10-14 DIAGNOSIS — Z86010 Personal history of colonic polyps: Secondary | ICD-10-CM | POA: Diagnosis not present

## 2018-10-14 DIAGNOSIS — Z79899 Other long term (current) drug therapy: Secondary | ICD-10-CM | POA: Diagnosis not present

## 2018-10-14 DIAGNOSIS — K5730 Diverticulosis of large intestine without perforation or abscess without bleeding: Secondary | ICD-10-CM | POA: Diagnosis not present

## 2018-10-14 DIAGNOSIS — Z87891 Personal history of nicotine dependence: Secondary | ICD-10-CM | POA: Diagnosis not present

## 2018-10-14 DIAGNOSIS — I2510 Atherosclerotic heart disease of native coronary artery without angina pectoris: Secondary | ICD-10-CM | POA: Diagnosis not present

## 2018-10-14 DIAGNOSIS — K219 Gastro-esophageal reflux disease without esophagitis: Secondary | ICD-10-CM | POA: Diagnosis not present

## 2018-10-14 DIAGNOSIS — C3431 Malignant neoplasm of lower lobe, right bronchus or lung: Secondary | ICD-10-CM | POA: Diagnosis not present

## 2018-10-14 DIAGNOSIS — G7000 Myasthenia gravis without (acute) exacerbation: Secondary | ICD-10-CM | POA: Insufficient documentation

## 2018-10-14 DIAGNOSIS — K8020 Calculus of gallbladder without cholecystitis without obstruction: Secondary | ICD-10-CM | POA: Insufficient documentation

## 2018-10-14 DIAGNOSIS — Z836 Family history of other diseases of the respiratory system: Secondary | ICD-10-CM | POA: Insufficient documentation

## 2018-10-14 DIAGNOSIS — J439 Emphysema, unspecified: Secondary | ICD-10-CM | POA: Diagnosis not present

## 2018-10-14 DIAGNOSIS — I252 Old myocardial infarction: Secondary | ICD-10-CM | POA: Insufficient documentation

## 2018-10-14 DIAGNOSIS — E785 Hyperlipidemia, unspecified: Secondary | ICD-10-CM | POA: Insufficient documentation

## 2018-10-14 DIAGNOSIS — Z7902 Long term (current) use of antithrombotics/antiplatelets: Secondary | ICD-10-CM | POA: Diagnosis not present

## 2018-10-14 DIAGNOSIS — Z7982 Long term (current) use of aspirin: Secondary | ICD-10-CM | POA: Insufficient documentation

## 2018-10-14 DIAGNOSIS — Z79890 Hormone replacement therapy (postmenopausal): Secondary | ICD-10-CM | POA: Insufficient documentation

## 2018-10-14 DIAGNOSIS — Z9889 Other specified postprocedural states: Secondary | ICD-10-CM

## 2018-10-14 LAB — CBC
HCT: 42.1 % (ref 36.0–46.0)
Hemoglobin: 13.3 g/dL (ref 12.0–15.0)
MCH: 28.9 pg (ref 26.0–34.0)
MCHC: 31.6 g/dL (ref 30.0–36.0)
MCV: 91.5 fL (ref 80.0–100.0)
Platelets: 153 10*3/uL (ref 150–400)
RBC: 4.6 MIL/uL (ref 3.87–5.11)
RDW: 12.5 % (ref 11.5–15.5)
WBC: 12.7 10*3/uL — ABNORMAL HIGH (ref 4.0–10.5)
nRBC: 0 % (ref 0.0–0.2)

## 2018-10-14 LAB — APTT: aPTT: 33 seconds (ref 24–36)

## 2018-10-14 LAB — PROTIME-INR
INR: 1.1 (ref 0.8–1.2)
Prothrombin Time: 13.6 seconds (ref 11.4–15.2)

## 2018-10-14 MED ORDER — LIDOCAINE HCL 1 % IJ SOLN
INTRAMUSCULAR | Status: AC
  Filled 2018-10-14: qty 20

## 2018-10-14 MED ORDER — FENTANYL CITRATE (PF) 100 MCG/2ML IJ SOLN
INTRAMUSCULAR | Status: AC | PRN
  Administered 2018-10-14: 25 ug via INTRAVENOUS

## 2018-10-14 MED ORDER — SODIUM CHLORIDE 0.9 % IV SOLN: INTRAVENOUS | Status: DC

## 2018-10-14 MED ORDER — FENTANYL CITRATE (PF) 100 MCG/2ML IJ SOLN
INTRAMUSCULAR | Status: AC
  Filled 2018-10-14: qty 2

## 2018-10-14 MED ORDER — MIDAZOLAM HCL 2 MG/2ML IJ SOLN
INTRAMUSCULAR | Status: AC
  Filled 2018-10-14: qty 2

## 2018-10-14 MED ORDER — MIDAZOLAM HCL 2 MG/2ML IJ SOLN
INTRAMUSCULAR | Status: AC | PRN
  Administered 2018-10-14: 0.5 mg via INTRAVENOUS
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
## 2018-10-14 NOTE — Procedures (Signed)
Pre procedural Dx: Right lower lobe pulmonary nodule  Post procedural Dx: Same  Technically successful CT guided biopsy of indeterminate hypermetabolic right lower lobe pulmonary nodule.   EBL: None.   Complications: None immediate.   Jay Crystel Demarco, MD Pager #: [phone]   

## 2018-10-14 NOTE — Sedation Documentation (Signed)
Pt discharged via [facility] verbal and written instructions given to daughter.

## 2018-10-14 NOTE — Discharge Instructions (Signed)
Moderate Conscious Sedation, Adult, Care After These instructions provide you with information about caring for yourself after your procedure. Your health care provider may also give you more specific instructions. Your treatment has been planned according to current medical practices, but problems sometimes occur. Call your health care provider if you have any problems or questions after your procedure. What can I expect after the procedure? After your procedure, it is common:  To feel sleepy for several hours.  To feel clumsy and have poor balance for several hours.  To have poor judgment for several hours.  To vomit if you eat too soon. Follow these instructions at home: For at least 24 hours after the procedure:   Do not: ? Participate in activities where you could fall or become injured. ? Drive. ? Use heavy machinery. ? Drink alcohol. ? Take sleeping pills or medicines that cause drowsiness. ? Make important decisions or sign legal documents. ? Take care of children on your own.  Rest. Eating and drinking  Follow the diet recommended by your health care provider.  If you vomit: ? Drink water, juice, or soup when you can drink without vomiting. ? Make sure you have little or no nausea before eating solid foods. General instructions  Have a responsible adult stay with you until you are awake and alert.  Take over-the-counter and prescription medicines only as told by your health care provider.  If you smoke, do not smoke without supervision.  Keep all follow-up visits as told by your health care provider. This is important. Contact a health care provider if:  You keep feeling nauseous or you keep vomiting.  You feel light-headed.  You develop a rash.  You have a fever. Get help right away if:  You have trouble breathing. This information is not intended to replace advice given to you by your health care provider. Make sure you discuss any questions you have  with your health care provider. Document Released: 04/23/2013 Document Revised: 12/06/2015 Document Reviewed: 10/23/2015 Elsevier Interactive Patient Education  2019 [place] Biopsy, Care After This sheet gives you information about how to care for yourself after your procedure. Your health care provider may also give you more specific instructions depending on the type of biopsy you had. If you have problems or questions, contact your health care provider. What can I expect after the procedure? After the procedure, it is common to have:  A cough.  A sore throat.  Pain where a needle, bronchoscope, or incision was used to collect a biopsy sample (biopsy site). Follow these instructions at home: Medicines   Take over-the-counter and prescription medicines only as told by your health care provider.  Do not drive for 24 hours if you were given a sedative.  Do not drink alcohol while taking pain medicine.  Do not drive or use heavy machinery while taking prescription pain medicine.  To prevent or treat constipation while you are taking prescription pain medicine, your health care provider may recommend that you: ? Drink enough fluid to keep your urine clear or pale yellow. ? Take over-the-counter or prescription medicines. ? Eat foods that are high in fiber, such as fresh fruits and vegetables, whole grains, and beans. ? Limit foods that are high in fat and processed sugars, such as fried and sweet foods. Activity  If you had an incision during your procedure, avoid activities that may pull the incision site open.  Return to your normal activities as told by your health care  provider. Ask your health care provider what activities are safe for you. If you had an open biopsy:   Follow instructions from your health care provider about how to take care of your incision. Make sure you: ? Wash your hands with soap and water before you change your bandage (dressing). If soap and  water are not available, use hand sanitizer. ? Change your dressing as told by your health care provider. ? Leave stitches (sutures), skin glue, or adhesive strips in place. These skin closures may need to stay in place for 2 weeks or longer. If adhesive strip edges start to loosen and curl up, you may trim the loose edges. Do not remove adhesive strips completely unless your health care provider tells you to do that.  Check your incision area every day for signs of infection. Check for: ? Redness, swelling, or pain. ? Fluid or blood. ? Warmth. ? Pus or a bad smell. General instructions  It is up to you to get the results of your procedure. Ask your health care provider, or the department that is doing the procedure, when your results will be ready. Contact a health care provider if:  You have a fever.  You have redness, swelling, or pain around your biopsy site.  You have fluid or blood coming from your biopsy site.  Your biopsy site feels warm to the touch.  You have pus or a bad smell coming from your biopsy site. Get help right away if:  You cough up blood.  You have trouble breathing.  You have chest pain. Summary  After the procedure, it is common to have a sore throat and a cough.  Return to your normal activities as told by your health care provider. Ask your health care provider what activities are safe for you.  Take over-the-counter and prescription medicines only as told by your health care provider.  Report any unusual symptoms to your health care provider. This information is not intended to replace advice given to you by your health care provider. Make sure you discuss any questions you have with your health care provider. Document Released: 08/01/2016 Document Revised: 08/01/2016 Document Reviewed: 08/01/2016 Elsevier Interactive Patient Education  2019 [employer].

## 2018-10-19 ENCOUNTER — Other Ambulatory Visit: Payer: Self-pay | Admitting: Neurology

## 2018-10-21 ENCOUNTER — Ambulatory Visit
Admission: RE | Admit: 2018-10-21 | Discharge: 2018-10-21 | Disposition: A | Discharge status: Home / Self Care | Payer: PPO | Source: Ambulatory Visit | Attending: Radiation Oncology | Admitting: Radiation Oncology

## 2018-10-21 ENCOUNTER — Ambulatory Visit: Admission: RE | Admit: 2018-10-21 | Payer: PPO | Source: Ambulatory Visit

## 2018-10-21 ENCOUNTER — Other Ambulatory Visit: Payer: Self-pay

## 2018-10-21 DIAGNOSIS — R918 Other nonspecific abnormal finding of lung field: Secondary | ICD-10-CM

## 2018-10-21 DIAGNOSIS — C3431 Malignant neoplasm of lower lobe, right bronchus or lung: Secondary | ICD-10-CM | POA: Insufficient documentation

## 2018-10-21 DIAGNOSIS — Z510 Encounter for antineoplastic radiation therapy: Secondary | ICD-10-CM | POA: Insufficient documentation

## 2018-10-22 ENCOUNTER — Ambulatory Visit
Admission: RE | Admit: 2018-10-22 | Discharge: 2018-10-22 | Disposition: A | Discharge status: Home / Self Care | Payer: PPO | Source: Ambulatory Visit | Attending: Radiation Oncology | Admitting: Radiation Oncology

## 2018-10-22 ENCOUNTER — Other Ambulatory Visit: Payer: Self-pay

## 2018-10-22 ENCOUNTER — Encounter: Payer: Self-pay | Admitting: Radiation Oncology

## 2018-10-22 VITALS — BP 124/67 | HR 67 | Temp 98.2°F | Resp 20

## 2018-10-22 DIAGNOSIS — C3431 Malignant neoplasm of lower lobe, right bronchus or lung: Secondary | ICD-10-CM

## 2018-10-22 DIAGNOSIS — Z510 Encounter for antineoplastic radiation therapy: Secondary | ICD-10-CM | POA: Diagnosis not present

## 2018-10-22 DIAGNOSIS — Z87891 Personal history of nicotine dependence: Secondary | ICD-10-CM | POA: Diagnosis not present

## 2018-10-22 DIAGNOSIS — R918 Other nonspecific abnormal finding of lung field: Secondary | ICD-10-CM
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
## 2018-10-22 NOTE — Patient Instructions (Signed)
Coronavirus (COVID-19) Are you at risk?  Are you at risk for the Coronavirus (COVID-19)?  To be considered HIGH RISK for Coronavirus (COVID-19), you have to meet the following criteria:  . Traveled to [place], [place], [place], [place] or [place]; or in the [place] to [place], [place], [place], or [place]; and have fever, cough, and shortness of breath within the last 2 weeks of travel OR . Been in close contact with a person diagnosed with COVID-19 within the last 2 weeks and have fever, cough, and shortness of breath . IF YOU DO NOT MEET THESE CRITERIA, YOU ARE CONSIDERED LOW RISK FOR COVID-19.  What to do if you are HIGH RISK for COVID-19?  . If you are having a medical emergency, call 911. . Seek medical care right away. Before you go to a doctor's office, urgent care or emergency department, call ahead and tell them about your recent travel, contact with someone diagnosed with COVID-19, and your symptoms. You should receive instructions from your physician's office regarding next steps of care.  . When you arrive at healthcare provider, tell the healthcare staff immediately you have returned from visiting [place], [place], [place], [place] or [place]; or traveled in the [place] to [place], [place], [place], or [place]; in the last two weeks or you have been in close contact with a person diagnosed with COVID-19 in the last 2 weeks.   . Tell the health care staff about your symptoms: fever, cough and shortness of breath. . After you have been seen by a medical provider, you will be either: o Tested for (COVID-19) and discharged home on quarantine except to seek medical care if symptoms worsen, and asked to  - Stay home and avoid contact with others until you get your results (4-5 days)  - Avoid travel on public transportation if possible (such as bus, train, or airplane) or o Sent to the Emergency Department by EMS for evaluation, COVID-19 testing, and possible  admission depending on your condition and test results.  What to do if you are LOW RISK for COVID-19?  Reduce your risk of any infection by using the same precautions used for avoiding the common cold or flu:  . Wash your hands often with soap and warm water for at least 20 seconds.  If soap and water are not readily available, use an alcohol-based hand sanitizer with at least 60% alcohol.  . If coughing or sneezing, cover your mouth and nose by coughing or sneezing into the elbow areas of your shirt or coat, into a tissue or into your sleeve (not your hands). . Avoid shaking hands with others and consider head nods or verbal greetings only. . Avoid touching your eyes, nose, or mouth with unwashed hands.  . Avoid close contact with people who are sick. . Avoid places or events with large numbers of people in one location, like concerts or sporting events. . Carefully consider travel plans you have or are making. . If you are planning any travel outside or inside the [place], visit the CDC's Travelers' Health webpage for the latest health notices. . If you have some symptoms but not all symptoms, continue to monitor at home and seek medical attention if your symptoms worsen. . If you are having a medical emergency, call 911.   ADDITIONAL HEALTHCARE OPTIONS FOR PATIENTS  [place] [facility] / e-Visit: [email or web address]         [facility]: [phone]  [place]   [facility]: [phone]                   [facility]: [phone]   

## 2018-10-22 NOTE — Progress Notes (Signed)
Thoracic Location of Tumor / Histology: Per PET Scan 09/25/18:  1. Dominant 2 cm right lower lobe pulmonary nodule is markedly hypermetabolic consistent with primary bronchogenic neoplasm.  Patient presented with symptoms of: 09/04/18:  Patient states she was cleaning a lamp on the floor.  She lost her balance.  She struck her back on some furniture at home.  Daughter states the patient has very poor balance.  She denied any head injury or loss of consciousness.  She had severe pain in her back.  Patient was brought to the ER by her family due to back pain.  She was noted to be hypoxic in the ER.  Supplemental oxygen was started.  CT scan of the chest performed due to her wheezing and shortness of breath demonstrated a 1.9 x 1.6 x 2.0 cm right lower lobe lobulated mass.  This was not present in her CT in 2014.  There is a high suspicion for lung cancer.  Patient has been informed by the ER provider that she has a lung mass and may have lung cancer.  Patient transferred to [facility] long hospital due to her acute hypoxic respiratory failure.  Her CTPA was negative for pulmonary embolism.  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  She has diffuse coronary atherosclerotic plaque in 3 vessels on her CT.  Biopsies revealed: 10/14/18:  Diagnosis Lung, needle/core biopsy(ies), right lower lobe - POORLY DIFFERENTIATED SQUAMOUS CELL CARCINOMA.  Tobacco/Marijuana/Snuff/ETOH use: She has a history for smoking 1 pack/day for around 60 years and she quit a week ago.  She has no history of alcohol or drug abuse.  Past/Anticipated interventions by cardiothoracic surgery, if any: None at this time.  Past/Anticipated interventions by medical oncology, if any: Per Dr. Julien Nordmann 09/27/18:  ASSESSMENT AND PLAN: This is a very pleasant 85 years old white female with at least a stage Ia presented with hypermetabolic right lower lobe pulmonary nodule but there was also scattered bilateral  ill-defined nodules with low-level FDG uptake suspicious for metastatic disease versus synchronous primary. I personally and independently reviewed the scans and discussed the results with the patient and her family and showed them the images. I recommended for the patient to proceed with CT-guided core biopsy of the right lower lobe pulmonary nodule for confirmation of tissue diagnosis. I also referred the patient to radiation oncology for consideration of SBRT to the right lower lobe pulmonary nodule.  The patient is not a good surgical candidate for resection. For the bilateral ill-defined nodules, we will continue to monitor them closely on upcoming scans. I will see the patient back for follow-up visit in 4 months for evaluation with repeat CT scan of the chest for restaging of her disease. The patient was advised to call immediately if she has any concerning symptoms in the interval. The patient voices understanding of current disease status and treatment options and is in agreement with the current care plan.   Signs/Symptoms  Weight changes, if any: denied having any recent weight loss or night sweats.  She has no nausea, vomiting, diarrhea or constipation.  She denied having any headache or visual changes.   Respiratory complaints, if any:  baseline shortness of breath increased with exertion.  She denied having any cough.  She has no chest pain or hemoptysis.  Pain issues, if any:  Pt denies any pain associated with her lung cancer.   SAFETY ISSUES:  Prior radiation? No  Pacemaker/ICD? No  Possible current pregnancy? No  Is the patient on methotrexate? No  Current Complaints / other details:  Pt presents today for initial consult with Dr. Sondra Come for Radiation Oncology. Pt is unaccompanied. This RN to attempt contact with daughter [name] via [employer] phone.   BP 124/67 (BP Location: Right Arm, Patient Position: Sitting)   Pulse 67   Temp 98.2 F (36.8 C) (Oral)   Resp 20    SpO2 100%   Wt Readings from Last 3 Encounters:  10/14/18 117 lb (53.1 kg)  09/27/18 117 lb (53.1 kg)  09/17/18 120 lb 6.4 oz (54.6 kg)   Loma Sousa, RN BSN

## 2018-10-23 NOTE — Progress Notes (Signed)
  Radiation Oncology         [phone]) [phone] ________________________________  Name: Brandi Raymond MRN: [id number]  Date: 10/22/2018  DOB: [birth year]   STEREOTACTIC BODY RADIOTHERAPY SIMULATION AND TREATMENT PLANNING NOTE    DIAGNOSIS: Clinical stage I poorly differentiated squamous cell carcinoma presenting in the right lower lung   NARRATIVE:  The patient was brought to the [facility].  Identity was confirmed.  All relevant records and images related to the planned course of therapy were reviewed.  The patient freely provided informed written consent to proceed with treatment after reviewing the details related to the planned course of therapy. The consent form was witnessed and verified by the simulation staff.  Then, the patient was set-up in a stable reproducible  supine position for radiation therapy.  A BodyFix immobilization pillow was fabricated for reproducible positioning.  Then I personally applied the abdominal compression paddle to limit respiratory excursion.  4D respiratoy motion management CT images were obtained.  Surface markings were placed.  The CT images were loaded into the planning software.  Then, using Cine, MIP, and standard views, the internal target volume (ITV) and planning target volumes (PTV) were delinieated, and avoidance structures were contoured.  Treatment planning then occurred.  The radiation prescription was entered and confirmed.  A total of two complex treatment devices were fabricated in the form of the BodyFix immobilization pillow and a neck accuform cushion.  I have requested : 3D Simulation  I have requested a DVH of the following structures: Heart, Lungs, Esophagus, Chest Wall, Brachial Plexus, Major Blood Vessels, and targets.  PLAN:  The patient will receive 54 Gy in 3 fractions.  -----------------------------------  Blair Promise, PhD, MD

## 2018-10-24 ENCOUNTER — Other Ambulatory Visit: Payer: Self-pay | Admitting: *Deleted

## 2018-10-24 NOTE — Progress Notes (Signed)
The proposed treatment discussed in cancer conference 10/24/2018 is for discussion purpose only and is not a binding recommendation. The patient was not physically examined nor present for their treatment options.  Therefore, final treatment plans cannot be decided.

## 2018-10-25 DIAGNOSIS — Z510 Encounter for antineoplastic radiation therapy: Secondary | ICD-10-CM | POA: Diagnosis not present

## 2018-10-25 DIAGNOSIS — C3431 Malignant neoplasm of lower lobe, right bronchus or lung: Secondary | ICD-10-CM | POA: Diagnosis not present

## 2018-10-29 ENCOUNTER — Ambulatory Visit: Payer: PPO | Admitting: Radiation Oncology

## 2018-10-30 ENCOUNTER — Other Ambulatory Visit: Payer: Self-pay

## 2018-10-30 ENCOUNTER — Ambulatory Visit: Payer: PPO | Admitting: Radiation Oncology

## 2018-10-30 ENCOUNTER — Ambulatory Visit
Admission: RE | Admit: 2018-10-30 | Discharge: 2018-10-30 | Disposition: A | Discharge status: Home / Self Care | Payer: PPO | Source: Ambulatory Visit | Attending: Radiation Oncology | Admitting: Radiation Oncology

## 2018-10-30 DIAGNOSIS — R918 Other nonspecific abnormal finding of lung field: Secondary | ICD-10-CM

## 2018-10-30 DIAGNOSIS — Z510 Encounter for antineoplastic radiation therapy: Secondary | ICD-10-CM | POA: Diagnosis not present

## 2018-10-30 NOTE — Progress Notes (Signed)
  Radiation Oncology         [phone]) [phone] ________________________________  Name: LAKEASHA PETION MRN: [id number]  Date: 10/30/2018  DOB: Jun 12, 1934  Stereotactic Body Radiotherapy Treatment Procedure Note  NARRATIVE:  YOSELYN MCGLADE was brought to the stereotactic radiation treatment machine and placed supine on the CT couch. The patient was set up for stereotactic body radiotherapy on the body fix pillow.  3D TREATMENT PLANNING AND DOSIMETRY:  The patient's radiation plan was reviewed and approved prior to starting treatment.  It showed 3-dimensional radiation distributions overlaid onto the planning CT.  The [facility] for the target structures as well as the organs at risk were reviewed. The documentation of this is filed in the radiation oncology EMR.  SIMULATION VERIFICATION:  The patient underwent CT imaging on the treatment unit.  These were carefully aligned to document that the ablative radiation dose would cover the target volume and maximally spare the nearby organs at risk according to the planned distribution.  SPECIAL TREATMENT PROCEDURE: LEILANEE RIGHETTI received high dose ablative stereotactic body radiotherapy to the planned target volume without unforeseen complications. Treatment was delivered uneventfully. The high doses associated with stereotactic body radiotherapy and the significant potential risks require careful treatment set up and patient monitoring constituting a special treatment procedure   STEREOTACTIC TREATMENT MANAGEMENT:  Following delivery, the patient was evaluated clinically. The patient tolerated treatment without significant acute effects, and was discharged to home in stable condition.    PLAN: Continue treatment as planned.  ________________________________  Blair Promise, PhD, MD

## 2018-10-31 ENCOUNTER — Ambulatory Visit: Payer: PPO | Admitting: Radiation Oncology

## 2018-11-01 ENCOUNTER — Other Ambulatory Visit: Payer: Self-pay

## 2018-11-01 ENCOUNTER — Ambulatory Visit
Admission: RE | Admit: 2018-11-01 | Discharge: 2018-11-01 | Disposition: A | Discharge status: Home / Self Care | Payer: PPO | Source: Ambulatory Visit | Attending: Radiation Oncology | Admitting: Radiation Oncology

## 2018-11-01 DIAGNOSIS — Z510 Encounter for antineoplastic radiation therapy: Secondary | ICD-10-CM | POA: Diagnosis not present

## 2018-11-04 ENCOUNTER — Ambulatory Visit: Payer: PPO | Admitting: Radiation Oncology

## 2018-11-06 ENCOUNTER — Other Ambulatory Visit: Payer: Self-pay

## 2018-11-06 ENCOUNTER — Ambulatory Visit
Admission: RE | Admit: 2018-11-06 | Discharge: 2018-11-06 | Disposition: A | Discharge status: Home / Self Care | Payer: PPO | Source: Ambulatory Visit | Attending: Radiation Oncology | Admitting: Radiation Oncology

## 2018-11-06 ENCOUNTER — Encounter: Payer: Self-pay | Admitting: Radiation Oncology

## 2018-11-06 DIAGNOSIS — R918 Other nonspecific abnormal finding of lung field: Secondary | ICD-10-CM

## 2018-11-06 DIAGNOSIS — Z510 Encounter for antineoplastic radiation therapy: Secondary | ICD-10-CM | POA: Diagnosis not present

## 2018-11-06 DIAGNOSIS — C3431 Malignant neoplasm of lower lobe, right bronchus or lung: Secondary | ICD-10-CM | POA: Diagnosis not present

## 2018-11-06 NOTE — Progress Notes (Signed)
  Radiation Oncology         [phone] ________________________________  Name: Brandi Raymond MRN: [id number]  Date: 11/06/2018  DOB: August 05, 1933  Stereotactic Body Radiotherapy Treatment Procedure Note  NARRATIVE:  Brandi Raymond was brought to the stereotactic radiation treatment machine and placed supine on the CT couch. The patient was set up for stereotactic body radiotherapy on the body fix pillow.  3D TREATMENT PLANNING AND DOSIMETRY:  The patient's radiation plan was reviewed and approved prior to starting treatment.  It showed 3-dimensional radiation distributions overlaid onto the planning CT.  The [facility] for the target structures as well as the organs at risk were reviewed. The documentation of this is filed in the radiation oncology EMR.  SIMULATION VERIFICATION:  The patient underwent CT imaging on the treatment unit.  These were carefully aligned to document that the ablative radiation dose would cover the target volume and maximally spare the nearby organs at risk according to the planned distribution.  SPECIAL TREATMENT PROCEDURE: Brandi Raymond received high dose ablative stereotactic body radiotherapy to the planned target volume without unforeseen complications. Treatment was delivered uneventfully. The high doses associated with stereotactic body radiotherapy and the significant potential risks require careful treatment set up and patient monitoring constituting a special treatment procedure   STEREOTACTIC TREATMENT MANAGEMENT:  Following delivery, the patient was evaluated clinically. The patient tolerated treatment without significant acute effects, and was discharged to home in stable condition.    PLAN: Continue treatment as planned.  ________________________________  Blair Promise, PhD, MD

## 2018-11-07 ENCOUNTER — Other Ambulatory Visit: Payer: Self-pay | Admitting: Internal Medicine

## 2018-11-07 NOTE — Progress Notes (Signed)
  Radiation Oncology         [phone] ________________________________  Name: Brandi Raymond MRN: [id number]  Date: 11/06/2018  DOB: 05/04/1934  End of Treatment Note  Diagnosis:   85 y.o. 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 The patient received 54 Gy in 3 fractions at 18 Gy per fraction.  Beams/energy:   SBRT/SRT-VMAT // 6X-FFF Photon  Narrative: The patient tolerated radiation treatment relatively well.   The patient did not have any signs of acute toxicity during treatment. She did report significant fatigue and LEFT chest pain after her second treatment which resolved on its own, as patient refused to seek medical treatment (treatment side is RIGHT). She denied any new cough or swallowing difficulties.  Plan: The patient has completed radiation treatment. We will follow up with the patient in one month by telephone, due to the COVID-19 pandemic. I advised the patient to call or return sooner if they have any questions or concerns related to their recovery or treatment.   ------------------------------------------------  Billie Lade, PhD, MD  This document serves as a record of services personally performed by Antony Blackbird, MD. It was created on his behalf by Ivar Bury, a trained medical scribe. The creation of this record is based on the scribe's personal observations and the provider's statements to them. This document has been checked and approved by the attending provider.

## 2018-11-07 NOTE — Telephone Encounter (Signed)
[place] Controlled Database Checked Last filled: 10/08/18 # 30 LOV w/you: 09/11/18  Next appt w/you: 12/26/18

## 2018-11-08 ENCOUNTER — Other Ambulatory Visit: Payer: Self-pay | Admitting: Internal Medicine

## 2018-11-09 DIAGNOSIS — J441 Chronic obstructive pulmonary disease with (acute) exacerbation: Secondary | ICD-10-CM | POA: Diagnosis not present

## 2018-11-13 ENCOUNTER — Other Ambulatory Visit: Payer: Self-pay | Admitting: Internal Medicine

## 2018-11-19 ENCOUNTER — Telehealth: Payer: Self-pay

## 2018-11-19 NOTE — Telephone Encounter (Signed)
Returned VM from pt. Conveyed to pt that it is advised she not resume smoking. Encouraged pt to explore cessation resources. Pt states "I already found that out" and ended call. Loma Sousa, RN BSN

## 2018-11-27 ENCOUNTER — Telehealth: Payer: Self-pay | Admitting: Internal Medicine

## 2018-11-27 DIAGNOSIS — J439 Emphysema, unspecified: Secondary | ICD-10-CM

## 2018-11-27 NOTE — Telephone Encounter (Signed)
Copied from [place] [id number]. Topic: General - Other >> Nov 27, 2018  4:25 PM Keene Breath wrote: Reason for CRM: Patient's daughter called to request a referral to a pulmonologist for her mother.  She stated that the doctor discussed it with them and now is the time that they think the referral is needed.  If there are any questions, please call daughter at [phone]

## 2018-11-28 NOTE — Telephone Encounter (Signed)
Referral ordered

## 2018-11-29 ENCOUNTER — Ambulatory Visit (INDEPENDENT_AMBULATORY_CARE_PROVIDER_SITE_OTHER): Payer: PPO | Admitting: Pulmonary Disease

## 2018-11-29 ENCOUNTER — Encounter: Payer: Self-pay | Admitting: Pulmonary Disease

## 2018-11-29 ENCOUNTER — Other Ambulatory Visit: Payer: Self-pay

## 2018-11-29 VITALS — BP 118/66 | HR 94 | Ht 64.0 in | Wt 120.6 lb

## 2018-11-29 DIAGNOSIS — F17210 Nicotine dependence, cigarettes, uncomplicated: Secondary | ICD-10-CM

## 2018-11-29 DIAGNOSIS — J439 Emphysema, unspecified: Secondary | ICD-10-CM | POA: Diagnosis not present

## 2018-11-29 MED ORDER — UMECLIDINIUM-VILANTEROL 62.5-25 MCG/INH IN AEPB: 1.0000 | INHALATION_SPRAY | Freq: Every day | RESPIRATORY_TRACT | 0 refills | Status: DC

## 2018-11-29 MED ORDER — UMECLIDINIUM-VILANTEROL 62.5-25 MCG/INH IN AEPB: 1.0000 | INHALATION_SPRAY | Freq: Every day | RESPIRATORY_TRACT | 5 refills | Status: DC

## 2018-11-29 NOTE — Addendum Note (Signed)
Addended by: Elton Sin on: 11/29/2018 02:24 PM   Modules accepted: Orders
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
## 2018-11-29 NOTE — Patient Instructions (Signed)
We will stop the [facility] and start you on a different inhaler called Anoro We will schedule for PFTs.  The completion of this test may be delayed due to COVID precautions  We will check your oxygen levels on exertion Continue to work on smoking cessation. Follow-up in 2 to 3 months.

## 2018-12-03 ENCOUNTER — Other Ambulatory Visit: Payer: Self-pay

## 2018-12-03 ENCOUNTER — Other Ambulatory Visit: Payer: Self-pay | Admitting: Internal Medicine

## 2018-12-03 MED ORDER — AMLODIPINE BESYLATE 5 MG PO TABS: 5.0000 mg | ORAL_TABLET | Freq: Every day | ORAL | 0 refills | Status: DC

## 2018-12-04 ENCOUNTER — Other Ambulatory Visit: Payer: Self-pay | Admitting: Internal Medicine

## 2018-12-04 NOTE — Telephone Encounter (Signed)
Check [place] registry last filled 11/07/2018.Marland KitchenJohny Raymond

## 2018-12-09 DIAGNOSIS — J441 Chronic obstructive pulmonary disease with (acute) exacerbation: Secondary | ICD-10-CM | POA: Diagnosis not present

## 2018-12-11 ENCOUNTER — Telehealth: Payer: Self-pay

## 2018-12-11 NOTE — Telephone Encounter (Signed)
Spoke with Levi Aland patients daughter. Reschedule appointment to early August after her CT in July.

## 2018-12-12 ENCOUNTER — Telehealth: Payer: Self-pay | Admitting: *Deleted

## 2018-12-12 ENCOUNTER — Ambulatory Visit: Payer: PPO | Admitting: Radiation Oncology

## 2018-12-12 NOTE — Telephone Encounter (Signed)
CALLED PATIENT TO ALTER FU FOR 12-12-18, RESCHEDULED FOR 02-20-19 @ 10:30 AM, PATIENT'S DAUGHTER- KATHY AGREED TO NEW DATE AND TIME

## 2018-12-13 DIAGNOSIS — H532 Diplopia: Secondary | ICD-10-CM | POA: Diagnosis not present

## 2018-12-13 DIAGNOSIS — Z961 Presence of intraocular lens: Secondary | ICD-10-CM | POA: Diagnosis not present

## 2018-12-13 DIAGNOSIS — H353131 Nonexudative age-related macular degeneration, bilateral, early dry stage: Secondary | ICD-10-CM | POA: Diagnosis not present

## 2018-12-13 DIAGNOSIS — H02534 Eyelid retraction left upper eyelid: Secondary | ICD-10-CM | POA: Diagnosis not present

## 2018-12-18 ENCOUNTER — Telehealth: Payer: Self-pay

## 2018-12-18 NOTE — Telephone Encounter (Signed)
Copied from [place] [id number]. Topic: General - Other >> Dec 18, 2018 11:40 AM Rayann Heman wrote: Reason for CRM: pt called and stated that someone from the [place] for oxygen order. Pt would like [place] to look out for this. FYI

## 2018-12-18 NOTE — Telephone Encounter (Signed)
Form was received and given to Dr. Quay Burow to sign. Will fax when sign.

## 2018-12-26 ENCOUNTER — Ambulatory Visit: Payer: PPO | Admitting: Internal Medicine

## 2019-01-01 NOTE — Progress Notes (Signed)
Subjective:    Patient ID: Brandi Raymond, female    DOB: April 13, 1934, 85 y.o.   MRN: [id number]  HPI The patient is here for follow up.  Her daughter is here with her today.  She is not exercising regularly.     CAD, Hypertension: She is taking her medication daily. She is compliant with a low sodium diet.  She denies chest pain, palpitations, edema and regular headaches. She does not monitor her blood pressure at home.    Osteoporosis, hx of L1, 3 fx:  She is taking calcium and vitamin d daly.  She was not able to afford prolia last year.    COPD:  She has chronic DOE, cough and wheeze.  She is still smoking.   She takes anoro daily.  She is on her oxygen 24 x 7 - 3 L/ min.  She is following with pulmonary.  Lung cancer s/p radiation:  She has completed radiation.  She has a follow up Ct scan in July.  She is following with pulmonary and oncology.    Hypothyroidism:  She is taking her medication daily.  She denies any recent changes in energy or weight that are unexplained.   Prediabetes:  She is compliant with a low sugar/carbohydrate diet.  She is not exercising regularly.  Hyperlipidemia: She is taking her medication daily. She is compliant with a low fat/cholesterol diet. She denies myalgias.   Sleep disorder:  She is taking clonazepam nightly.  She is unable to sleep without the medication, but sleeps well with medication.   Medications and allergies reviewed with patient and updated if appropriate.  Patient Active Problem List   Diagnosis Date Noted  . Constipation 09/11/2018  . COPD with acute exacerbation ([place]) 09/04/2018  . Right lower lobe lung mass 09/04/2018  . Closed compression fracture of L1 lumbar vertebra, initial encounter ([place]) 09/04/2018  . Preoperative clearance 07/19/2018  . Lower back pain 06/25/2018  . Cubital tunnel syndrome on left 05/22/2018  . Dupuytren's contracture of both hands 05/10/2018  . Primary osteoarthritis of both knees 05/10/2018   . Difficulty urinating 05/10/2018  . Osteoporosis 06/14/2017  . Prediabetes 06/23/2015  . Depression 06/23/2015  . Carotid stenosis 10/12/2014  . Sleep disorder 04/09/2014  . Mesenteric artery stenosis ([place]) 01/13/2013  . Thoracic aneurysm without mention of rupture 11/04/2012  . Chronic mesenteric ischemia ([place]) 10/08/2012  . Memory deficit 06/20/2012  . Irritable bowel syndrome 06/20/2012  . Ocular myasthenia gravis ([place]) 06/20/2012  . PVD (peripheral vascular disease) ([place]) 06/20/2012  . Hereditary and idiopathic peripheral neuropathy 05/22/2012  . Syncope 08/28/2011  . ABDOMINAL BRUIT 08/17/2009  . Coronary atherosclerosis 09/05/2008  . Hypothyroidism 05/26/2008  . VITAMIN D DEFICIENCY 05/26/2008  . CHOLELITHIASIS 12/06/2007  . DIVERTICULOSIS, COLON 12/05/2007  . HYPERLIPIDEMIA 08/19/2007  . COPD (chronic obstructive pulmonary disease) with emphysema ([place]) 08/19/2007  . CIGARETTE SMOKER 02/06/2007  . Essential hypertension 02/06/2007  . COLONIC POLYPS 11/21/2004    Current Outpatient Medications on File Prior to Visit  Medication Sig Dispense Refill  . amLODipine (NORVASC) 5 MG tablet TAKE 1 TABLET BY MOUTH EVERY DAY 90 tablet 1  . aspirin 81 MG tablet Take 81 mg by mouth daily.      . bisacodyl (DULCOLAX) 5 MG EC tablet Take 15 mg by mouth at bedtime.     . Calcium-Magnesium-Vitamin D (CALCIUM 1200+D3 PO) Take 1 tablet by mouth daily.    . clonazePAM (KLONOPIN) 0.5 MG tablet TAKE 1 TABLET BY MOUTH  EVERYDAY AT BEDTIME 30 tablet 0  . clopidogrel (PLAVIX) 75 MG tablet TAKE 1 TABLET BY MOUTH EVERY DAY 90 tablet 3  . donepezil (ARICEPT) 10 MG tablet TAKE 1 TABLET BY MOUTH EVERYDAY AT BEDTIME (Patient taking differently: Take 10 mg by mouth at bedtime. ) 90 tablet 3  . DULoxetine (CYMBALTA) 60 MG capsule TAKE 1 CAPSULE BY MOUTH EVERY DAY 90 capsule 2  . gabapentin (NEURONTIN) 300 MG capsule TAKE ONE CAPSULE TWICE DAILY AND 2 AT NIGHT = FOUR TOTAL DAILY. (Patient taking  differently: Take 300-600 mg by mouth See admin instructions. TAKE ONE CAPSULE TWICE DAILY AND 2 AT NIGHT = FOUR TOTAL DAILY.) 360 capsule 1  . levothyroxine (SYNTHROID, LEVOTHROID) 125 MCG tablet TAKE 1 TABLET BY MOUTH EVERY DAY (Patient taking differently: Take 125 mcg by mouth daily before breakfast. ) 90 tablet 1  . lisinopril (PRINIVIL,ZESTRIL) 10 MG tablet Take 1 tablet (10 mg total) by mouth daily. 30 tablet 5  . Multiple Vitamins-Minerals (PRESERVISION AREDS 2 PO) Take 1 capsule by mouth 2 (two) times daily.    Marland Kitchen oxyCODONE (OXY IR/ROXICODONE) 5 MG immediate release tablet Take 1 tablet (5 mg total) by mouth every 4 (four) hours as needed for moderate pain. 30 tablet 0  . pravastatin (PRAVACHOL) 40 MG tablet Take 1 tablet (40 mg total) by mouth daily. 90 tablet 2  . PROLIA 60 MG/ML SOSY injection Inject 1 each into the skin every 6 (six) months.    . umeclidinium-vilanterol (ANORO ELLIPTA) 62.5-25 MCG/INH AEPB Inhale 1 puff into the lungs daily. 2 each 0  . umeclidinium-vilanterol (ANORO ELLIPTA) 62.5-25 MCG/INH AEPB Inhale 1 puff into the lungs daily. 60 each 5   No current facility-administered medications on file prior to visit.     Past Medical History:  Diagnosis Date  . Acute MI inferior subsequent episode care [facility]) 1993   PTCA RCA  . Adenomatous colon polyp   . Arthritis   . CAD (coronary artery disease)    Dr Percival Spanish  . Carotid artery occlusion   . Cervical spine fracture ([place])   . COPD (chronic obstructive pulmonary disease) ([place])   . Depression   . Diverticulosis   . Dyslipidemia   . Gastritis 09/15/1991  . GERD (gastroesophageal reflux disease) 09/15/1991   Dr Sharlett Iles  . Hiatal hernia 09/15/1991  . Hip fracture, right ([place])   . HLD (hyperlipidemia)   . HTN (hypertension)   . Hyperplastic polyps of stomach 11/2007   colonoscopy  . Hypertension   . Hypothyroidism    affecting the left eye, proptosis  . Iron deficiency anemia   . Macular degeneration of left eye    . Memory loss   . Mesenteric artery stenosis ([place])   . Myasthenia gravis ([place])    With ocular features  . Myocardial infarction ([place]) 1993  . Ocular myasthenia gravis ([place])    Dr Jannifer Franklin  . Orthostatic hypotension 06/05/2013  . Pneumonia    hx  . PONV (postoperative nausea and vomiting)   . Shortness of breath dyspnea    occ  . Strabismus    left eye  . Syncope 1998    Past Surgical History:  Procedure Laterality Date  . ABDOMINAL AORTAGRAM N/A 10/15/2012   Procedure: ABDOMINAL Maxcine Ham;  Surgeon: Serafina Mitchell, MD;  Location: [facility] CATH LAB;  Service: Cardiovascular;  Laterality: N/A;  . arm surgery Left    fx  . BALLOON ANGIOPLASTY, ARTERY  1993  . [place]  LAD 20/50, CFX OK, RCA 30 at prev PTCA site, EF with mild HK inferior wall  . CAROTID ANGIOGRAM N/A 10/28/2014   Procedure: CAROTID ANGIOGRAM;  Surgeon: Serafina Mitchell, MD;  Location: [facility] CATH LAB;  Service: Cardiovascular;  Laterality: N/A;  . CAROTID ENDARTERECTOMY    . CATARACT EXTRACTION     bilateral  . COLONOSCOPY W/ POLYPECTOMY  2006   Adenomatous polyps  . ENDARTERECTOMY Left 01/14/2015   Procedure: LEFT CAROTID ENDARTERECTOMY ;  Surgeon: Serafina Mitchell, MD;  Location: [place];  Service: Vascular;  Laterality: Left;  . ENDARTERECTOMY Right 08/12/2015   Procedure: ENDARTERECTOMY CAROTID WITH PATCH ANGIOPLASTY;  Surgeon: Serafina Mitchell, MD;  Location: [place];  Service: Vascular;  Laterality: Right;  . EYE MUSCLE SURGERY Left 11/04/2015  . EYE SURGERY Bilateral May 2016   Eyelids  . FOOT SURGERY Left   . LEG SURGERY Left    laceration  . MIDDLE EAR SURGERY Left 1970  . PERCUTANEOUS STENT INTERVENTION  12/03/2012   Procedure: PERCUTANEOUS STENT INTERVENTION;  Surgeon: Serafina Mitchell, MD;  Location: [facility] CATH LAB;  Service: Cardiovascular;;  sma stent x1  . STRABISMUS SURGERY Left 10/28/2015   Procedure: REPAIR STRABISMUS LEFT EYE;  Surgeon: Lamonte Sakai, MD;  Location: [place];  Service:  Ophthalmology;  Laterality: Left;  . Third-degree Isabelle Matt  2003   [facility]-legs ,buttocks,arms  . TOTAL ABDOMINAL HYSTERECTOMY  1973   Dysfunctional menses  . UPPER GI ENDOSCOPY      Dr Sharlett Iles  . VISCERAL ANGIOGRAM N/A 10/15/2012   Procedure: VISCERAL ANGIOGRAM;  Surgeon: Serafina Mitchell, MD;  Location: [facility] CATH LAB;  Service: Cardiovascular;  Laterality: N/A;  . VISCERAL ANGIOGRAM N/A 12/03/2012   Procedure: VISCERAL ANGIOGRAM;  Surgeon: Serafina Mitchell, MD;  Location: [facility] CATH LAB;  Service: Cardiovascular;  Laterality: N/A;  . VISCERAL ANGIOGRAM N/A 08/05/2013   Procedure: MESENTERIC ANGIOGRAM;  Surgeon: Serafina Mitchell, MD;  Location: [facility] CATH LAB;  Service: Cardiovascular;  Laterality: N/A;  . VISCERAL ANGIOGRAM N/A 10/28/2014   Procedure: VISCERAL ANGIOGRAM;  Surgeon: Serafina Mitchell, MD;  Location: [facility] CATH LAB;  Service: Cardiovascular;  Laterality: N/A;    Social History   Socioeconomic History  . Marital status: Married    Spouse name: Not on file  . Number of children: 3  . Years of education: 9th  . Highest education level: Not on file  Occupational History  . Occupation: Retired  Scientific laboratory technician  . Financial resource strain: Not on file  . Food insecurity    Worry: Not on file    Inability: Not on file  . Transportation needs    Medical: Not on file    Non-medical: Not on file  Tobacco Use  . Smoking status: Current Some Day Smoker    Packs/day: 0.25    Years: 60.00    Pack years: 15.00    Types: Cigarettes  . Smokeless tobacco: Never Used  . Tobacco comment: now 3 cigarettes/ day-she quit the day she went on oxygen  Substance and Sexual Activity  . Alcohol use: No    Alcohol/week: 0.0 standard drinks  . Drug use: No  . Sexual activity: Never  Lifestyle  . Physical activity    Days per week: Not on file    Minutes per session: Not on file  . Stress: Not on file  Relationships  . Social Herbalist on phone: Not on file    Gets together: Not  on  file    Attends religious service: Not on file    Active member of club or organization: Not on file    Attends meetings of clubs or organizations: Not on file    Relationship status: Not on file  Other Topics Concern  . Not on file  Social History Narrative   Patient is right handed.   Patient drinks 3-4 cups of caffeine daily.    Family History  Problem Relation Age of Onset  . Throat cancer Mother        ? thyroid cancer  . Cancer Mother   . Emphysema Father   . Diabetes Father   . Heart attack Father 26  . Colon cancer Brother   . Cerebral aneurysm Brother   . Hypothyroidism Sister        X29  . Cancer Brother        Ear  . Diabetes Paternal Grandmother   . Diabetes Paternal Grandfather   . Diabetes Maternal Aunt     Review of Systems  Constitutional: Negative for chills and fever.  Respiratory: Positive for cough, shortness of breath and wheezing.   Cardiovascular: Negative for chest pain, palpitations and leg swelling.  Neurological: Positive for light-headedness. Negative for headaches.  Psychiatric/Behavioral: Positive for dysphoric mood and sleep disturbance (controlled). The patient is not nervous/anxious.        Objective:   Vitals:   01/02/19 1108  BP: 112/62  Pulse: (!) 59  Temp: 99 F (37.2 C)  SpO2: 94%   BP Readings from Last 3 Encounters:  01/02/19 112/62  11/29/18 118/66  10/22/18 124/67   Wt Readings from Last 3 Encounters:  01/02/19 122 lb (55.3 kg)  11/29/18 120 lb 9.6 oz (54.7 kg)  10/14/18 117 lb (53.1 kg)   Body mass index is 20.94 kg/m.   Physical Exam    Constitutional: Appears well-developed and well-nourished. No distress.  HENT:  Head: Normocephalic and atraumatic.  Neck: Neck supple. No tracheal deviation present. No thyromegaly present.  No cervical lymphadenopathy Cardiovascular: Normal rate, regular rhythm and normal heart sounds.   No murmur heard. No carotid bruit .  No edema Pulmonary/Chest: Effort normal and  breath sounds normal. No respiratory distress. No has no wheezes. No rales.  Skin: Skin is warm and dry. Not diaphoretic.  Psychiatric: Normal mood and affect. Behavior is normal.      Assessment & Plan:    See Problem List for Assessment and Plan of chronic medical problems.

## 2019-01-01 NOTE — Patient Instructions (Addendum)
   Medications reviewed and updated.  Changes include :   Decrease lisinopril to 5 mg daily  Your prescription(s) have been submitted to your pharmacy. Please take as directed and contact our office if you believe you are having problem(s) with the medication(s).    Please followup in 6 months

## 2019-01-02 ENCOUNTER — Ambulatory Visit (INDEPENDENT_AMBULATORY_CARE_PROVIDER_SITE_OTHER): Payer: PPO | Admitting: Internal Medicine

## 2019-01-02 ENCOUNTER — Encounter: Payer: Self-pay | Admitting: Internal Medicine

## 2019-01-02 ENCOUNTER — Other Ambulatory Visit: Payer: Self-pay

## 2019-01-02 VITALS — BP 112/62 | HR 59 | Temp 99.0°F | Ht 64.0 in | Wt 122.0 lb

## 2019-01-02 DIAGNOSIS — R7303 Prediabetes: Secondary | ICD-10-CM

## 2019-01-02 DIAGNOSIS — E039 Hypothyroidism, unspecified: Secondary | ICD-10-CM

## 2019-01-02 DIAGNOSIS — I10 Essential (primary) hypertension: Secondary | ICD-10-CM

## 2019-01-02 DIAGNOSIS — E782 Mixed hyperlipidemia: Secondary | ICD-10-CM

## 2019-01-02 DIAGNOSIS — R413 Other amnesia: Secondary | ICD-10-CM

## 2019-01-02 DIAGNOSIS — G479 Sleep disorder, unspecified: Secondary | ICD-10-CM

## 2019-01-02 DIAGNOSIS — G609 Hereditary and idiopathic neuropathy, unspecified: Secondary | ICD-10-CM

## 2019-01-02 MED ORDER — LISINOPRIL 5 MG PO TABS: 5.0000 mg | ORAL_TABLET | Freq: Every day | ORAL | 3 refills | Status: DC

## 2019-01-02 NOTE — Assessment & Plan Note (Signed)
Following with neurology Continue gabapentin and Cymbalta

## 2019-01-02 NOTE — Assessment & Plan Note (Signed)
Blood pressure has been on the low-normal side and because of her age I am concerned about the risk of falls She experiences lightheadedness at times-possibly related to smoking, but concerned about mild hypotension We will decrease lisinopril to 5 mg daily Continue amlodipine She will try to start monitoring at home We will defer blood work for now

## 2019-01-02 NOTE — Assessment & Plan Note (Signed)
Continue aricept Following with neurology

## 2019-01-02 NOTE — Assessment & Plan Note (Signed)
Controlled Continue clonazepam

## 2019-01-02 NOTE — Assessment & Plan Note (Signed)
Continue statin. 

## 2019-01-02 NOTE — Assessment & Plan Note (Signed)
Clinically euthyroid Continue current dose of medication

## 2019-01-02 NOTE — Assessment & Plan Note (Signed)
Continue low sugar/carb diet Will check a1c at next visit

## 2019-01-05 ENCOUNTER — Other Ambulatory Visit: Payer: Self-pay | Admitting: Internal Medicine

## 2019-01-06 NOTE — Telephone Encounter (Signed)
Check [place] registry last filled 12/04/2018.Marland KitchenJohny Chess

## 2019-01-09 DIAGNOSIS — J441 Chronic obstructive pulmonary disease with (acute) exacerbation: Secondary | ICD-10-CM | POA: Diagnosis not present

## 2019-01-22 ENCOUNTER — Encounter: Payer: Self-pay | Admitting: Internal Medicine

## 2019-01-27 ENCOUNTER — Other Ambulatory Visit: Payer: Self-pay

## 2019-01-27 ENCOUNTER — Encounter (HOSPITAL_COMMUNITY): Payer: Self-pay

## 2019-01-27 ENCOUNTER — Inpatient Hospital Stay: Payer: PPO | Attending: Internal Medicine

## 2019-01-27 ENCOUNTER — Ambulatory Visit (HOSPITAL_COMMUNITY)
Admission: RE | Admit: 2019-01-27 | Discharge: 2019-01-27 | Disposition: A | Discharge status: Home / Self Care | Payer: PPO | Source: Ambulatory Visit | Attending: Internal Medicine | Admitting: Internal Medicine

## 2019-01-27 DIAGNOSIS — C3431 Malignant neoplasm of lower lobe, right bronchus or lung: Secondary | ICD-10-CM | POA: Diagnosis not present

## 2019-01-27 DIAGNOSIS — C3490 Malignant neoplasm of unspecified part of unspecified bronchus or lung: Secondary | ICD-10-CM | POA: Diagnosis not present

## 2019-01-27 LAB — CBC WITH DIFFERENTIAL (CANCER CENTER ONLY)
Abs Immature Granulocytes: 0.08 10*3/uL — ABNORMAL HIGH (ref 0.00–0.07)
Basophils Absolute: 0.1 10*3/uL (ref 0.0–0.1)
Basophils Relative: 1 %
Eosinophils Absolute: 0.3 10*3/uL (ref 0.0–0.5)
Eosinophils Relative: 3 %
HCT: 38.4 % (ref 36.0–46.0)
Hemoglobin: 11.9 g/dL — ABNORMAL LOW (ref 12.0–15.0)
Immature Granulocytes: 1 %
Lymphocytes Relative: 9 %
Lymphs Abs: 0.8 10*3/uL (ref 0.7–4.0)
MCH: 28.5 pg (ref 26.0–34.0)
MCHC: 31 g/dL (ref 30.0–36.0)
MCV: 92.1 fL (ref 80.0–100.0)
Monocytes Absolute: 1.2 10*3/uL — ABNORMAL HIGH (ref 0.1–1.0)
Monocytes Relative: 13 %
Neutro Abs: 6.4 10*3/uL (ref 1.7–7.7)
Neutrophils Relative %: 73 %
Platelet Count: 200 10*3/uL (ref 150–400)
RBC: 4.17 MIL/uL (ref 3.87–5.11)
RDW: 12 % (ref 11.5–15.5)
WBC Count: 8.8 10*3/uL (ref 4.0–10.5)
nRBC: 0 % (ref 0.0–0.2)

## 2019-01-27 LAB — CMP (CANCER CENTER ONLY)
ALT: 9 U/L (ref 0–44)
AST: 17 U/L (ref 15–41)
Albumin: 2.6 g/dL — ABNORMAL LOW (ref 3.5–5.0)
Alkaline Phosphatase: 77 U/L (ref 38–126)
Anion gap: 10 (ref 5–15)
BUN: 15 mg/dL (ref 8–23)
CO2: 28 mmol/L (ref 22–32)
Calcium: 8.5 mg/dL — ABNORMAL LOW (ref 8.9–10.3)
Chloride: 101 mmol/L (ref 98–111)
Creatinine: 0.84 mg/dL (ref 0.44–1.00)
GFR, Est AFR Am: >60 mL/min (ref >60)
GFR, Estimated: >60 mL/min (ref >60)
Glucose, Bld: 105 mg/dL — ABNORMAL HIGH (ref 70–99)
Potassium: 4.5 mmol/L (ref 3.5–5.1)
Sodium: 139 mmol/L (ref 135–145)
Total Bilirubin: 0.2 mg/dL — ABNORMAL LOW (ref 0.3–1.2)
Total Protein: 6.3 g/dL — ABNORMAL LOW (ref 6.5–8.1)

## 2019-01-27 MED ORDER — IOHEXOL 300 MG/ML  SOLN
75.0000 mL | Freq: Once | INTRAMUSCULAR | Status: AC | PRN
  Administered 2019-01-27: 75 mL via INTRAVENOUS

## 2019-01-27 MED ORDER — SODIUM CHLORIDE (PF) 0.9 % IJ SOLN
INTRAMUSCULAR | Status: AC
  Filled 2019-01-27: qty 50

## 2019-01-30 ENCOUNTER — Telehealth: Payer: Self-pay | Admitting: Medical Oncology

## 2019-01-30 ENCOUNTER — Encounter: Payer: Self-pay | Admitting: Internal Medicine

## 2019-01-30 ENCOUNTER — Telehealth: Payer: Self-pay | Admitting: *Deleted

## 2019-01-30 ENCOUNTER — Inpatient Hospital Stay: Payer: PPO | Admitting: Internal Medicine

## 2019-01-30 NOTE — Telephone Encounter (Signed)
Pt daughter [name] called with a request to cancel todays appt and r.s a webex visit with MD as pt is very concerned about hearing results in the room with out family next to her. Message to scheduling. MD Notified.

## 2019-01-30 NOTE — Telephone Encounter (Signed)
Confirmed appt for July 23. Note to scheduler.

## 2019-02-03 ENCOUNTER — Other Ambulatory Visit: Payer: Self-pay | Admitting: Internal Medicine

## 2019-02-03 NOTE — Telephone Encounter (Signed)
Last OV  01/02/19 Next OV   07/04/19  [place] Controlled Substance Database checked. Last filled on 01/06/19

## 2019-02-05 ENCOUNTER — Telehealth: Payer: Self-pay | Admitting: Internal Medicine

## 2019-02-05 NOTE — Telephone Encounter (Signed)
Called patient regarding upcoming Webex appointment, spoke with patient's daughter and screener test is complete. E-mail has been sent.

## 2019-02-06 ENCOUNTER — Telehealth: Payer: Self-pay | Admitting: Internal Medicine

## 2019-02-06 ENCOUNTER — Inpatient Hospital Stay (HOSPITAL_BASED_OUTPATIENT_CLINIC_OR_DEPARTMENT_OTHER): Payer: PPO | Admitting: Internal Medicine

## 2019-02-06 ENCOUNTER — Encounter: Payer: Self-pay | Admitting: Internal Medicine

## 2019-02-06 DIAGNOSIS — Z7982 Long term (current) use of aspirin: Secondary | ICD-10-CM

## 2019-02-06 DIAGNOSIS — C3491 Malignant neoplasm of unspecified part of right bronchus or lung: Secondary | ICD-10-CM | POA: Insufficient documentation

## 2019-02-06 DIAGNOSIS — Z79899 Other long term (current) drug therapy: Secondary | ICD-10-CM | POA: Diagnosis not present

## 2019-02-06 DIAGNOSIS — C3431 Malignant neoplasm of lower lobe, right bronchus or lung: Secondary | ICD-10-CM

## 2019-02-06 DIAGNOSIS — C3490 Malignant neoplasm of unspecified part of unspecified bronchus or lung: Secondary | ICD-10-CM

## 2019-02-06 DIAGNOSIS — I10 Essential (primary) hypertension: Secondary | ICD-10-CM
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
## 2019-02-06 NOTE — Telephone Encounter (Signed)
Scheduled appt per 7/23 los - mailed letter with appt date and time

## 2019-02-08 DIAGNOSIS — J441 Chronic obstructive pulmonary disease with (acute) exacerbation: Secondary | ICD-10-CM | POA: Diagnosis not present

## 2019-02-09 ENCOUNTER — Other Ambulatory Visit: Payer: Self-pay | Admitting: Adult Health

## 2019-02-19 ENCOUNTER — Other Ambulatory Visit: Payer: Self-pay | Admitting: Neurology

## 2019-02-20 ENCOUNTER — Ambulatory Visit
Admission: RE | Admit: 2019-02-20 | Discharge: 2019-02-20 | Disposition: A | Discharge status: Home / Self Care | Payer: PPO | Source: Ambulatory Visit | Attending: Radiation Oncology | Admitting: Radiation Oncology

## 2019-02-20 ENCOUNTER — Other Ambulatory Visit: Payer: Self-pay

## 2019-02-20 ENCOUNTER — Encounter: Payer: Self-pay | Admitting: Radiation Oncology

## 2019-02-20 VITALS — BP 120/97 | HR 67 | Temp 98.5°F | Resp 18 | Ht 64.0 in

## 2019-02-20 DIAGNOSIS — K8020 Calculus of gallbladder without cholecystitis without obstruction: Secondary | ICD-10-CM | POA: Diagnosis not present

## 2019-02-20 DIAGNOSIS — J439 Emphysema, unspecified: Secondary | ICD-10-CM | POA: Diagnosis not present

## 2019-02-20 DIAGNOSIS — C3431 Malignant neoplasm of lower lobe, right bronchus or lung: Secondary | ICD-10-CM

## 2019-02-20 DIAGNOSIS — C3491 Malignant neoplasm of unspecified part of right bronchus or lung: Secondary | ICD-10-CM

## 2019-02-20 DIAGNOSIS — M8580 Other specified disorders of bone density and structure, unspecified site: Secondary | ICD-10-CM | POA: Diagnosis not present

## 2019-02-20 DIAGNOSIS — R590 Localized enlarged lymph nodes: Secondary | ICD-10-CM | POA: Insufficient documentation

## 2019-02-20 DIAGNOSIS — K449 Diaphragmatic hernia without obstruction or gangrene: Secondary | ICD-10-CM | POA: Insufficient documentation

## 2019-02-20 DIAGNOSIS — Z923 Personal history of irradiation: Secondary | ICD-10-CM | POA: Insufficient documentation

## 2019-02-20 DIAGNOSIS — I700 Atherosclerosis of aorta: Secondary | ICD-10-CM | POA: Insufficient documentation

## 2019-02-20 DIAGNOSIS — Z7982 Long term (current) use of aspirin: Secondary | ICD-10-CM | POA: Diagnosis not present

## 2019-02-20 DIAGNOSIS — I2510 Atherosclerotic heart disease of native coronary artery without angina pectoris: Secondary | ICD-10-CM | POA: Insufficient documentation

## 2019-02-20 DIAGNOSIS — Z08 Encounter for follow-up examination after completed treatment for malignant neoplasm: Secondary | ICD-10-CM | POA: Diagnosis not present

## 2019-02-20 DIAGNOSIS — Z79899 Other long term (current) drug therapy: Secondary | ICD-10-CM | POA: Diagnosis not present

## 2019-02-20 DIAGNOSIS — M25511 Pain in right shoulder: Secondary | ICD-10-CM | POA: Diagnosis not present

## 2019-02-20 NOTE — Progress Notes (Signed)
Patient in for follow up. States she is having pain up under her right shoulder blade 5/10. It hurts when she moves wrong or coughs.

## 2019-02-20 NOTE — Progress Notes (Signed)
Radiation Oncology         [phone] ________________________________  Name: Brandi Raymond MRN: [id number]  Date: 02/20/2019  DOB: 1934/06/04  Follow-Up Visit Note  CC: Binnie Rail, MD  Binnie Rail, MD    ICD-10-CM   1. Malignant neoplasm of bronchus of right lower lobe ([place])  C34.31   2. Stage I squamous cell carcinoma of right lung (HCC)  C34.91     Diagnosis:   85 y.o. female with Clinical stage I poorly differentiated squamous cell carcinoma presenting in the right lower lung  Interval Since Last Radiation:  4 months   Radiation treatment dates:   10/30/2018, 11/01/2018, 11/06/2018  Site/dose:   The tumor in the RLL lung was treated with a course of stereotactic body radiation treatment. The patient received 54 Gy in 3 fractions at 18 Gy per fraction.  Narrative:  The patient returns today for routine follow-up.  Since completion of treatment, she underwent a chest CT scan on 01/27/2019 which showed: Interval development of extensive airspace consolidation, ground-glass attenuation and fibrosis within the right lower lobe, may be compatible with changes due to external beam radiation; underlying lung lesion is no longer visualized as it is obscured by consolidation. Interval enlargement of right hilar lymph node, subcarinal lymph node, and low right paratracheal lymph nodes, may reflect reactive adenopathy due to radiation induced inflammation; metastatic adenopathy is not excluded.                On review of systems, the patient reports pain up under her right shoulder blade, rated 5/10. It hurts when she moves or coughs.  This is been intermittent and just occurred   Over the past few days            ALLERGIES:  is allergic to silver sulfadiazine.  Meds: Current Outpatient Medications  Medication Sig Dispense Refill  . amLODipine (NORVASC) 5 MG tablet TAKE 1 TABLET BY MOUTH EVERY DAY 90 tablet 1  . aspirin 81 MG tablet Take 81 mg by mouth daily.      . bisacodyl  (DULCOLAX) 5 MG EC tablet Take 15 mg by mouth at bedtime.     . Calcium-Magnesium-Vitamin D (CALCIUM 1200+D3 PO) Take 1 tablet by mouth daily.    . clonazePAM (KLONOPIN) 0.5 MG tablet TAKE 1 TABLET BY MOUTH EVERYDAY AT BEDTIME 30 tablet 0  . clopidogrel (PLAVIX) 75 MG tablet TAKE 1 TABLET BY MOUTH EVERY DAY 90 tablet 3  . donepezil (ARICEPT) 10 MG tablet TAKE 1 TABLET BY MOUTH EVERYDAY AT BEDTIME (Patient taking differently: Take 10 mg by mouth at bedtime. ) 90 tablet 3  . DULoxetine (CYMBALTA) 60 MG capsule TAKE 1 CAPSULE BY MOUTH EVERY DAY 90 capsule 2  . gabapentin (NEURONTIN) 300 MG capsule TAKE ONE CAPSULE TWICE DAILY AND 2 AT NIGHT = FOUR TOTAL DAILY. (Patient taking differently: Take 300-600 mg by mouth See admin instructions. TAKE ONE CAPSULE TWICE DAILY AND 2 AT NIGHT = FOUR TOTAL DAILY.) 360 capsule 1  . levothyroxine (SYNTHROID, LEVOTHROID) 125 MCG tablet TAKE 1 TABLET BY MOUTH EVERY DAY (Patient taking differently: Take 125 mcg by mouth daily before breakfast. ) 90 tablet 1  . lisinopril (ZESTRIL) 5 MG tablet Take 1 tablet (5 mg total) by mouth daily. 90 tablet 3  . Multiple Vitamins-Minerals (PRESERVISION AREDS 2 PO) Take 1 capsule by mouth 2 (two) times daily.    . pravastatin (PRAVACHOL) 40 MG tablet Take 1 tablet (40 mg total) by  mouth daily. 90 tablet 2  . PROLIA 60 MG/ML SOSY injection Inject 1 each into the skin every 6 (six) months.    . umeclidinium-vilanterol (ANORO ELLIPTA) 62.5-25 MCG/INH AEPB Inhale 1 puff into the lungs daily. 2 each 0  . oxyCODONE (OXY IR/ROXICODONE) 5 MG immediate release tablet Take 1 tablet (5 mg total) by mouth every 4 (four) hours as needed for moderate pain. (Patient not taking: Reported on 02/20/2019) 30 tablet 0   No current facility-administered medications for this encounter.     Physical Findings: The patient is in no acute distress. Patient is alert and oriented.  height is 5\' 4"  (1.626 m). Her temporal temperature is 98.5 F (36.9 C). Her  blood pressure is 120/97 (abnormal) and her pulse is 67. Her respiration is 18 and oxygen saturation is 99%.   Lungs are clear to auscultation bilaterally. Heart has regular rate and rhythm. No palpable cervical, supraclavicular, or axillary adenopathy. Abdomen soft, non-tender, normal bowel sounds.  No point tenderness noted along the right scapular region or rib cage area  Lab Findings: Lab Results  Component Value Date   WBC 8.8 01/27/2019   HGB 11.9 (L) 01/27/2019   HCT 38.4 01/27/2019   MCV 92.1 01/27/2019   PLT 200 01/27/2019    Radiographic Findings: Ct Chest W Contrast  Result Date: 01/27/2019 CLINICAL DATA:  Follow-up lung cancer. EXAM: CT CHEST WITH CONTRAST TECHNIQUE: Multidetector CT imaging of the chest was performed during intravenous contrast administration. CONTRAST:  59mL OMNIPAQUE IOHEXOL 300 MG/ML  SOLN COMPARISON:  CT chest 09/03/2018 and PET-CT 09/25/2018 FINDINGS: Cardiovascular: Normal heart size. No pericardial effusion identified. Aortic atherosclerosis. The left main and 3 vessel coronary artery calcifications. Mediastinum/Nodes: The trachea appears patent and is midline. Small to moderate size hiatal hernia. No enlarged supraclavicular or axillary lymph nodes. Subcarinal lymph node measures 1.7 cm, image 66/2. Previously 1.1 cm. Right paratracheal lymph node measures 1.2 cm, image 61/2. Previously 0.8 cm. Right hilar lymph node measures 1.6 cm, image 68/2. The previously 1.1 cm. Lungs/Pleura: Advanced changes of emphysema. There is a large area of fibrosis, airspace consolidation, and ground-glass attenuation involving the right lower lobe. This is presumed to represent changes due to external beam radiation although progression of disease would be difficult to rule out. The underlying 1.9 cm FDG avid Lung nodule is no longer measurable and obscured by presumed changes of external beam radiation. Right upper lobe subpleural nodule is stable measuring 6 mm, image 34/5. Right  apically ground-glass nodule is unchanged measuring 6 mm, image 26/5. Perifissural nodule along the minor fissure appears partially calcified and is unchanged, image 51/7. Upper Abdomen: Small gallstones. Aortic atherosclerosis. No acute abnormality identified. Musculoskeletal: Osteopenia. Progression of L1 compression fracture with near vertebra plana deformity. No new fractures identified. IMPRESSION: 1. Interval development of extensive airspace consolidation, ground-glass attenuation and fibrosis within the right lower lobe. In the appropriate clinical setting findings may be compatible with changes due to external beam radiation. Underlying lung lesion is no longer visualized as it is obscured by consolidation. 2. Interval enlargement of right hilar lymph node, subcarinal lymph node and low right paratracheal lymph nodes. Although this may reflect reactive adenopathy due to radiation induced inflammation, metastatic adenopathy is not excluded. Consider short-term interval follow-up. 3. Aortic Atherosclerosis (ICD10-I70.0) and Emphysema (ICD10-J43.9). Coronary artery calcifications 4. Interval progression of L1 compression deformity which is now near vertebra plana. Electronically Signed   By: Kerby Moors M.D.   On: 01/27/2019 10:32    Impression:  Clinical stage I poorly differentiated squamous cell carcinoma presenting in the right lower lung.   Clinically stable status post SBRT. Patient did have CT scan of the chest ordered by Dr. Julien Nordmann on July 13th. It showed extensive airspace consolidation in the area of previous radiation treatment. The lung lesion was no longer visible. In addition, the patient had developed right hilar and subcarinal adenopathy as well as lower right paratracheal. These were felt to be related to inflammation but metastatic disease could not be ruled out.   Plan:  Patient will proceed with CT scan of the chest in approximately 3 months and then follow up with Dr. Julien Nordmann and  myself.   ____________________________________  Blair Promise, PhD, MD  This document serves as a record of services personally performed by Gery Pray, MD. It was created on his behalf by Rae Lips, a trained medical scribe. The creation of this record is based on the scribe's personal observations and the provider's statements to them. This document has been checked and approved by the attending provider.

## 2019-02-20 NOTE — Patient Instructions (Signed)
Coronavirus (COVID-19) Are you at risk?  Are you at risk for the Coronavirus (COVID-19)?  To be considered HIGH RISK for Coronavirus (COVID-19), you have to meet the following criteria:  . Traveled to [place], [place], [place], [place] or [place]; or in the [place] to [place], [place], [place], or [place]; and have fever, cough, and shortness of breath within the last 2 weeks of travel OR . Been in close contact with a person diagnosed with COVID-19 within the last 2 weeks and have fever, cough, and shortness of breath . IF YOU DO NOT MEET THESE CRITERIA, YOU ARE CONSIDERED LOW RISK FOR COVID-19.  What to do if you are HIGH RISK for COVID-19?  . If you are having a medical emergency, call 911. . Seek medical care right away. Before you go to a doctor's office, urgent care or emergency department, call ahead and tell them about your recent travel, contact with someone diagnosed with COVID-19, and your symptoms. You should receive instructions from your physician's office regarding next steps of care.  . When you arrive at healthcare provider, tell the healthcare staff immediately you have returned from visiting [place], [place], [place], [place] or [place]; or traveled in the [place] to [place], [place], [place], or [place]; in the last two weeks or you have been in close contact with a person diagnosed with COVID-19 in the last 2 weeks.   . Tell the health care staff about your symptoms: fever, cough and shortness of breath. . After you have been seen by a medical provider, you will be either: o Tested for (COVID-19) and discharged home on quarantine except to seek medical care if symptoms worsen, and asked to  - Stay home and avoid contact with others until you get your results (4-5 days)  - Avoid travel on public transportation if possible (such as bus, train, or airplane) or o Sent to the Emergency Department by EMS for evaluation, COVID-19 testing, and possible  admission depending on your condition and test results.  What to do if you are LOW RISK for COVID-19?  Reduce your risk of any infection by using the same precautions used for avoiding the common cold or flu:  . Wash your hands often with soap and warm water for at least 20 seconds.  If soap and water are not readily available, use an alcohol-based hand sanitizer with at least 60% alcohol.  . If coughing or sneezing, cover your mouth and nose by coughing or sneezing into the elbow areas of your shirt or coat, into a tissue or into your sleeve (not your hands). . Avoid shaking hands with others and consider head nods or verbal greetings only. . Avoid touching your eyes, nose, or mouth with unwashed hands.  . Avoid close contact with people who are sick. . Avoid places or events with large numbers of people in one location, like concerts or sporting events. . Carefully consider travel plans you have or are making. . If you are planning any travel outside or inside the US, visit the CDC's Travelers' Health webpage for the latest health notices. . If you have some symptoms but not all symptoms, continue to monitor at home and seek medical attention if your symptoms worsen. . If you are having a medical emergency, call 911.   ADDITIONAL HEALTHCARE OPTIONS FOR PATIENTS  [facility] / e-Visit: [email or web address]         [facility]: [phone]  [facility]: [phone]                   [facility]: [phone]   
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
## 2019-02-25 ENCOUNTER — Other Ambulatory Visit: Payer: Self-pay

## 2019-02-25 ENCOUNTER — Ambulatory Visit (INDEPENDENT_AMBULATORY_CARE_PROVIDER_SITE_OTHER): Payer: PPO | Admitting: Neurology

## 2019-02-25 ENCOUNTER — Telehealth: Payer: Self-pay | Admitting: Internal Medicine

## 2019-02-25 ENCOUNTER — Encounter: Payer: Self-pay | Admitting: Neurology

## 2019-02-25 VITALS — BP 120/64 | HR 72 | Temp 98.7°F | Ht 64.0 in | Wt 122.6 lb

## 2019-02-25 DIAGNOSIS — G609 Hereditary and idiopathic neuropathy, unspecified: Secondary | ICD-10-CM | POA: Diagnosis not present

## 2019-02-25 DIAGNOSIS — G7000 Myasthenia gravis without (acute) exacerbation: Secondary | ICD-10-CM

## 2019-02-25 MED ORDER — GABAPENTIN 300 MG PO CAPS: ORAL_CAPSULE | ORAL | 1 refills | Status: DC

## 2019-02-25 MED ORDER — DONEPEZIL HCL 10 MG PO TABS: ORAL_TABLET | ORAL | 3 refills | Status: DC

## 2019-02-25 NOTE — Patient Instructions (Signed)
Please continue taking gabapentin, cymbalta for neuropathy pain. Please continue Aricept for memory.

## 2019-02-25 NOTE — Progress Notes (Signed)
I have read the note, and I agree with the clinical assessment and plan.  Brandi Raymond   

## 2019-02-25 NOTE — Telephone Encounter (Signed)
Patient received a bill from [facility] for $231 for her Prolia but patient was under [place]. Appears it was not coded as "patient supplied med".  [name] has contacted [place] and is working on Advertising account executive. I have informed patient of progress.  Appointment has been scheduled for patient's next Prolia injection on 03/13/2019 at 10:20am. [name], can you please [facility] so that [facility] can be mailed here? I have told the patient that if for some reason it is not here, we will contact her and move the appointment out.

## 2019-02-28 ENCOUNTER — Ambulatory Visit: Payer: PPO | Admitting: Neurology

## 2019-03-01 ENCOUNTER — Other Ambulatory Visit: Payer: Self-pay | Admitting: Internal Medicine

## 2019-03-06 ENCOUNTER — Other Ambulatory Visit: Payer: Self-pay | Admitting: Internal Medicine

## 2019-03-11 DIAGNOSIS — J441 Chronic obstructive pulmonary disease with (acute) exacerbation: Secondary | ICD-10-CM | POA: Diagnosis not present

## 2019-03-13 ENCOUNTER — Telehealth: Payer: Self-pay

## 2019-03-13 ENCOUNTER — Ambulatory Visit: Payer: PPO

## 2019-03-13 MED ORDER — DENOSUMAB 60 MG/ML ~~LOC~~ SOSY: 60.0000 mg | PREFILLED_SYRINGE | Freq: Once | SUBCUTANEOUS | 0 refills | Status: AC

## 2019-03-13 NOTE — Telephone Encounter (Signed)
I have left message with daughter/kathy---billing has corrected this issue, the bill she has is not valid, no need to pay, we have corrected billing insurance, patient should not owe anything else for prolia injection---can talk with Lyla Jasek,RN at [facility] office if needed

## 2019-03-22 ENCOUNTER — Other Ambulatory Visit: Payer: Self-pay | Admitting: Internal Medicine

## 2019-03-29 ENCOUNTER — Other Ambulatory Visit: Payer: Self-pay | Admitting: Internal Medicine

## 2019-04-02 ENCOUNTER — Other Ambulatory Visit: Payer: Self-pay | Admitting: Internal Medicine

## 2019-04-02 NOTE — Telephone Encounter (Signed)
[place] Controlled Database Checked Last filled: 03/06/19 # 30 LOV w/you: 01/02/19 Next appt w/you: 07/04/19

## 2019-04-10 ENCOUNTER — Telehealth: Payer: Self-pay

## 2019-04-10 ENCOUNTER — Other Ambulatory Visit: Payer: Self-pay

## 2019-04-10 MED ORDER — DENOSUMAB 60 MG/ML ~~LOC~~ SOSY: 60.0000 mg | PREFILLED_SYRINGE | Freq: Once | SUBCUTANEOUS | 0 refills | Status: AC

## 2019-04-10 NOTE — Telephone Encounter (Signed)
Copied from [place] [id number]. Topic: General - Call Back - No Documentation >> Apr 08, 2019  1:05 PM Erick Blinks wrote: Reason for CRM: Pt's daughter [name] called on behalf of prolia shot injection. Please advise Best contact: [phone] >> Apr 09, 2019 12:10 PM Celene Kras A wrote: Pharmacy called stating the pt called and is under the impression that the prolia shot has been sent to office. They state they have not sent anything. Please advise.  >> Apr 09, 2019  9:08 AM [place], Isidoro Donning wrote: Do you know anything about getting her scheduled? Have we received her Prolia?  >> Apr 09, 2019  9:00 AM Reyne Dumas L wrote: Pt's daughter is calling again to find out when pt can be scheduled for her Prolia injection.  [facility]---last rx was sent on 03/13/19---I never received medication from [facility] and they are calling patient asking if she still wants to get it---so since it may have expired, I have resent rx to pharmacy and talked with kathy/daughter to advise, [name] will call me back if she doesn't hear from pharmacy soon ---patient uses [facility] and injection should be sent to our office for administration, to be documented as "patient supplied med"

## 2019-04-11 DIAGNOSIS — J441 Chronic obstructive pulmonary disease with (acute) exacerbation: Secondary | ICD-10-CM | POA: Diagnosis not present

## 2019-04-14 NOTE — Telephone Encounter (Signed)
[facility] need call back to give verbal ok to deliver pt's prolia to the office

## 2019-04-15 NOTE — Telephone Encounter (Signed)
[facility] melinda[facility], med to be delivered to our office on 9/30----when med arrives, we need to contact patient to schedule nurse visit for administration--need to document as "patient supplied med"

## 2019-04-18 ENCOUNTER — Ambulatory Visit (INDEPENDENT_AMBULATORY_CARE_PROVIDER_SITE_OTHER): Payer: PPO

## 2019-04-18 DIAGNOSIS — Z23 Encounter for immunization: Secondary | ICD-10-CM

## 2019-04-18 DIAGNOSIS — M810 Age-related osteoporosis without current pathological fracture: Secondary | ICD-10-CM

## 2019-04-18 MED ORDER — DENOSUMAB 60 MG/ML ~~LOC~~ SOSY
60.0000 mg | PREFILLED_SYRINGE | Freq: Once | SUBCUTANEOUS | Status: AC
  Administered 2019-04-18: 60 mg via SUBCUTANEOUS

## 2019-04-18 NOTE — Progress Notes (Signed)
Flu and prolia injection  Binnie Rail, MD

## 2019-04-22 ENCOUNTER — Telehealth: Payer: Self-pay | Admitting: Internal Medicine

## 2019-04-22 NOTE — Telephone Encounter (Signed)
Returned patient's phone call regarding rescheduling lab appointment, per patient's request appointment time has been rescheduled to fit with CT scan.

## 2019-05-01 ENCOUNTER — Other Ambulatory Visit: Payer: Self-pay | Admitting: Internal Medicine

## 2019-05-01 NOTE — Telephone Encounter (Signed)
Last RF 04/03/19 Last OV 01/02/19 Next OV 07/04/19

## 2019-05-07 ENCOUNTER — Telehealth: Payer: Self-pay | Admitting: *Deleted

## 2019-05-07 ENCOUNTER — Telehealth: Payer: Self-pay | Admitting: Internal Medicine

## 2019-05-07 NOTE — Telephone Encounter (Signed)
Ok. Thank you.

## 2019-05-07 NOTE — Telephone Encounter (Signed)
CALLED PATIENT'S DAUGHTER- KATHY Shelton TO ASK ABOUT ANOTHER FU DAY DUE TO DR. KINARD BEING ON VACATION, LVM FOR A RETURN CALL

## 2019-05-07 NOTE — Telephone Encounter (Signed)
Returned patient's phone call regarding rescheduling 10/26 appointment, spoke with patient's daughter and appointment has been moved to 11/05.   Message to provider.

## 2019-05-09 ENCOUNTER — Ambulatory Visit (HOSPITAL_COMMUNITY)
Admission: RE | Admit: 2019-05-09 | Discharge: 2019-05-09 | Disposition: A | Discharge status: Home / Self Care | Payer: PPO | Source: Ambulatory Visit | Attending: Internal Medicine | Admitting: Internal Medicine

## 2019-05-09 ENCOUNTER — Other Ambulatory Visit: Payer: Self-pay

## 2019-05-09 ENCOUNTER — Encounter (HOSPITAL_COMMUNITY): Payer: Self-pay

## 2019-05-09 ENCOUNTER — Inpatient Hospital Stay: Payer: PPO | Attending: Internal Medicine

## 2019-05-09 ENCOUNTER — Other Ambulatory Visit: Payer: PPO

## 2019-05-09 DIAGNOSIS — C3490 Malignant neoplasm of unspecified part of unspecified bronchus or lung: Secondary | ICD-10-CM | POA: Insufficient documentation

## 2019-05-09 DIAGNOSIS — R590 Localized enlarged lymph nodes: Secondary | ICD-10-CM | POA: Diagnosis not present

## 2019-05-09 DIAGNOSIS — C3431 Malignant neoplasm of lower lobe, right bronchus or lung: Secondary | ICD-10-CM | POA: Insufficient documentation

## 2019-05-09 LAB — CMP (CANCER CENTER ONLY)
ALT: 13 U/L (ref 0–44)
AST: 23 U/L (ref 15–41)
Albumin: 3.3 g/dL — ABNORMAL LOW (ref 3.5–5.0)
Alkaline Phosphatase: 70 U/L (ref 38–126)
Anion gap: 8 (ref 5–15)
BUN: 19 mg/dL (ref 8–23)
CO2: 28 mmol/L (ref 22–32)
Calcium: 8.6 mg/dL — ABNORMAL LOW (ref 8.9–10.3)
Chloride: 105 mmol/L (ref 98–111)
Creatinine: 0.92 mg/dL (ref 0.44–1.00)
GFR, Est AFR Am: >60 mL/min (ref >60)
GFR, Estimated: 58 mL/min — ABNORMAL LOW (ref >60)
Glucose, Bld: 94 mg/dL (ref 70–99)
Potassium: 4.5 mmol/L (ref 3.5–5.1)
Sodium: 141 mmol/L (ref 135–145)
Total Bilirubin: <0.2 mg/dL — ABNORMAL LOW (ref 0.3–1.2)
Total Protein: 6.4 g/dL — ABNORMAL LOW (ref 6.5–8.1)

## 2019-05-09 LAB — CBC WITH DIFFERENTIAL (CANCER CENTER ONLY)
Abs Immature Granulocytes: 0.05 10*3/uL (ref 0.00–0.07)
Basophils Absolute: 0.1 10*3/uL (ref 0.0–0.1)
Basophils Relative: 1 %
Eosinophils Absolute: 0.2 10*3/uL (ref 0.0–0.5)
Eosinophils Relative: 3 %
HCT: 37.9 % (ref 36.0–46.0)
Hemoglobin: 11.7 g/dL — ABNORMAL LOW (ref 12.0–15.0)
Immature Granulocytes: 1 %
Lymphocytes Relative: 20 %
Lymphs Abs: 1.3 10*3/uL (ref 0.7–4.0)
MCH: 28 pg (ref 26.0–34.0)
MCHC: 30.9 g/dL (ref 30.0–36.0)
MCV: 90.7 fL (ref 80.0–100.0)
Monocytes Absolute: 0.7 10*3/uL (ref 0.1–1.0)
Monocytes Relative: 11 %
Neutro Abs: 4.2 10*3/uL (ref 1.7–7.7)
Neutrophils Relative %: 64 %
Platelet Count: 157 10*3/uL (ref 150–400)
RBC: 4.18 MIL/uL (ref 3.87–5.11)
RDW: 15 % (ref 11.5–15.5)
WBC Count: 6.5 10*3/uL (ref 4.0–10.5)
nRBC: 0 % (ref 0.0–0.2)

## 2019-05-09 MED ORDER — SODIUM CHLORIDE (PF) 0.9 % IJ SOLN
INTRAMUSCULAR | Status: AC
  Filled 2019-05-09: qty 50

## 2019-05-09 MED ORDER — IOHEXOL 300 MG/ML  SOLN
75.0000 mL | Freq: Once | INTRAMUSCULAR | Status: AC | PRN
  Administered 2019-05-09: 75 mL via INTRAVENOUS

## 2019-05-11 DIAGNOSIS — J441 Chronic obstructive pulmonary disease with (acute) exacerbation: Secondary | ICD-10-CM | POA: Diagnosis not present

## 2019-05-12 ENCOUNTER — Ambulatory Visit: Payer: PPO | Admitting: Radiation Oncology

## 2019-05-12 ENCOUNTER — Ambulatory Visit: Payer: PPO | Admitting: Internal Medicine

## 2019-05-17 ENCOUNTER — Other Ambulatory Visit (HOSPITAL_COMMUNITY)
Admission: RE | Admit: 2019-05-17 | Discharge: 2019-05-17 | Disposition: A | Discharge status: Home / Self Care | Payer: PPO | Source: Ambulatory Visit | Attending: Pulmonary Disease | Admitting: Pulmonary Disease

## 2019-05-17 DIAGNOSIS — Z01812 Encounter for preprocedural laboratory examination: Secondary | ICD-10-CM | POA: Diagnosis not present

## 2019-05-17 DIAGNOSIS — Z20828 Contact with and (suspected) exposure to other viral communicable diseases: Secondary | ICD-10-CM | POA: Insufficient documentation

## 2019-05-18 LAB — NOVEL CORONAVIRUS, NAA (HOSP ORDER, SEND-OUT TO REF LAB; TAT 18-24 HRS): SARS-CoV-2, NAA: NOT DETECTED

## 2019-05-20 NOTE — Progress Notes (Signed)
@Patient  ID: Brandi Raymond, female    DOB: 1933/08/03, 85 y.o.   MRN: [id number]  Chief Complaint  Patient presents with  . Follow-up    f/u PFT    Referring provider: Binnie Rail, MD  HPI:  85 year old female current everyday smoker followed in our office for pulmonary emphysema  PMH: Hypothyroidism, vitamin D deficiency, hyperlipidemia, hypertension, PVD, prediabetes, depression, Smoker/ Smoking History: Current smoker.  3 to 10 cigarettes a day. 60+ pack history Maintenance: Anoro Ellipta Pt of: Dr. Vaughan Browner  05/21/2019  - Visit   85 year old female current every day smoker followed in our office for pulmonary emphysema.  Patient completing pulmonary function testing today.  Pulmonary function testing results listed below:  05/21/2019-pulmonary function test-FVC 1.80 (72% predicted), postbronchodilator ratio 53, postbronchodilator FEV1 0.95 (51% predicted), no bronchodilator response, DLCO 7.34 (39% predicted)  Patient continues to smoke between 3 to 10 cigarettes a day.  She is interested in stopping smoking today.  She has previously used nicotine replacement therapies in the past.  She is unsure if the nicotine replacement therapy has helped as she is to sneak out and still smoke cigarettes.  Patient does not want to be maintained on oxygen long-term.  If she is having to use oxygen long-term she will least would like to see if she can qualify for a portable oxygen concentrator.  Patient feels that the Anoro Ellipta has helped her significantly.  She enjoys using this way more than Breo Ellipta.  She continues to have imaging as well as office visit follow-up with Dr. Earlie Server as well as Dr. Dwana Curd and with oncology.  Questionaires / Pulmonary Flowsheets:   MMRC: mMRC Dyspnea Scale mMRC Score  05/21/2019 2    Tests:   Imaging: CT chest 09/03/2018- lobulated mass in the superior segment of the right lower lobe measuring 2 cm.  Atherosclerosis, emphysema.  PET scan  09/25/18-2 cm right lower lobe pulmonary nodule is hypermetabolic.  Scattered bilateral soft 8 mm ill-defined lung nodules with low uptake.  05/21/2019-pulmonary function test-FVC 1.80 (72% predicted), postbronchodilator ratio 53, postbronchodilator FEV1 0.95 (51% predicted), no bronchodilator response, DLCO 7.34 (39% predicted)   Labs: CT-guided biopsy 10/14/2018-Poorly differentiated squamous cell cancer  CBC 09/17/2018-WBC 11.3, eos 1%, absolute eosinophil count 113   FENO:  No results found for: NITRICOXIDE  PFT: PFT Results Latest Ref Rng & Units 05/21/2019  FVC-Pre L 1.80  FVC-Predicted Pre % 72  FVC-Post L 1.81  FVC-Predicted Post % 73  Pre FEV1/FVC % % 54  Post FEV1/FCV % % 53  FEV1-Pre L 0.98  FEV1-Predicted Pre % 53  FEV1-Post L 0.95  DLCO UNC% % 39  DLCO COR %Predicted % 55  TLC L 4.61  TLC % Predicted % 91  RV % Predicted % 109    WALK:  SIX MIN WALK 05/21/2019  Tech Comments: Patient walked at a slow pace without difficulty. Patient required 3 L pulsed O2 to maintain oxygen saturations of 90%//btr    Imaging: Ct Chest W Contrast  Addendum Date: 05/09/2019   ADDENDUM REPORT: 05/09/2019 17:17 ADDENDUM: Interval decrease in size of subcarinal and right hilar lymph node with stable mediastinal lymph nodes. Significance remains uncertain. Electronically Signed   By: Zetta Bills M.D.   On: 05/09/2019 17:17   Result Date: 05/09/2019 CLINICAL DATA:  Lung cancer, non-small cell with diagnosis in February 2020, history of radiation therapy. EXAM: CT CHEST WITH CONTRAST TECHNIQUE: Multidetector CT imaging of the chest was performed during intravenous contrast administration.  CONTRAST:  53mL OMNIPAQUE IOHEXOL 300 MG/ML  SOLN COMPARISON:  01/27/2019 FINDINGS: Cardiovascular: Marked calcific atherosclerotic changes throughout the thoracic aorta. Stable caliber. Noncalcified plaque also with irregularity in the thoracic aorta. Extensive coronary artery calcifications. Mitral  annular and aortic valvular calcifications. No signs of pericardial effusion. Heart size stable, mildly enlarged. Central pulmonary vasculature is unremarkable. Mediastinum/Nodes: No signs of thoracic inlet lymphadenopathy. Subcarinal lymph node measuring 12 mm short axis (image 67, series 2) previously 15 mm. Small AP window and paratracheal lymph nodes are stable largest approximately 9 mm. No signs of hilar lymphadenopathy. Lungs/Pleura: Post radiation changes in the right chest with further volume loss and left signs of surrounding pneumonitis than on the prior exam. Signs of moderate to marked pulmonary emphysema at the lung apices. Increased fissural thickening along the minor fissure in the right chest. This measures approximately 9 mm in greatest thickness (image 37, series 7, sagittal view and shows nodularity in terms of its borders distorting the fissure. (Image 39, series 5): Stable 5 x 4 mm pulmonary nodule in the right upper lobe. Subtle ground-glass nodularity in the upper lobe and lingula. Upper Abdomen: Moderate size hiatal hernia. No sign of acute process in the upper abdomen. Musculoskeletal: Redemonstration of near complete loss of height at the L1 vertebral level. Osteopenia. No destructive bone process or acute abnormality. IMPRESSION: 1. Further volume loss in the right lower lobe following radiation therapy. 2. Increased fissural thickening along the minor fissure in the right chest with some nodular features is suspicious for lymphangitic spread of tumor away from site of treated disease. Area of evolving post treatment change and inflammatory changes are included in the differential. Correlation with radiation treatment planning followed by short interval follow-up or PET evaluation may be helpful. 3. Stable 5 x 4 mm pulmonary nodule in the right upper lobe. Recommend attention on follow-up. 4. Subtle ground-glass nodularity in the upper lobe and lingula could represent an infectious or  inflammatory process. Recommend attention on follow-up. Aortic Atherosclerosis (ICD10-I70.0) and Emphysema (ICD10-J43.9). Electronically Signed: By: Zetta Bills M.D. On: 05/09/2019 15:20    Lab Results:  CBC    Component Value Date/Time   WBC 6.5 05/09/2019 1332   WBC 12.7 (H) 10/14/2018 0739   RBC 4.18 05/09/2019 1332   HGB 11.7 (L) 05/09/2019 1332   HCT 37.9 05/09/2019 1332   PLT 157 05/09/2019 1332   MCV 90.7 05/09/2019 1332   MCH 28.0 05/09/2019 1332   MCHC 30.9 05/09/2019 1332   RDW 15.0 05/09/2019 1332   LYMPHSABS 1.3 05/09/2019 1332   MONOABS 0.7 05/09/2019 1332   EOSABS 0.2 05/09/2019 1332   BASOSABS 0.1 05/09/2019 1332    BMET    Component Value Date/Time   NA 141 05/09/2019 1332   K 4.5 05/09/2019 1332   CL 105 05/09/2019 1332   CO2 28 05/09/2019 1332   GLUCOSE 94 05/09/2019 1332   BUN 19 05/09/2019 1332   CREATININE 0.92 05/09/2019 1332   CALCIUM 8.6 (L) 05/09/2019 1332   GFRNONAA 58 (L) 05/09/2019 1332   GFRAA >60 05/09/2019 1332    BNP No results found for: BNP  ProBNP No results found for: PROBNP  Specialty Problems      Pulmonary Problems   COPD (chronic obstructive pulmonary disease) with emphysema (HCC)   COPD with acute exacerbation (HCC)   Right lower lobe lung mass   Stage I squamous cell carcinoma of right lung (HCC)   Chronic respiratory failure with hypoxia (HCC)  Allergies  Allergen Reactions  . Silver Sulfadiazine     REACTION: lowers wbc ; applied for burns @ [place]     Immunization History  Administered Date(s) Administered  . Fluad Quad(high Dose 65+) 04/18/2019  . Influenza, High Dose Seasonal PF 04/09/2014, 06/23/2015, 06/11/2017, 05/10/2018  . Influenza, Seasonal, Injecte, Preservative Fre 06/20/2012  . Influenza,inj,Quad PF,6+ Mos 05/22/2013  . Pneumococcal Conjugate-13 12/06/2016  . Pneumococcal Polysaccharide-23 09/26/2013, 08/13/2015  . Tdap 11/13/2012    Past Medical History:  Diagnosis Date   . Acute MI inferior subsequent episode care [facility]) 1993   PTCA RCA  . Adenomatous colon polyp   . Arthritis   . CAD (coronary artery disease)    Dr Percival Spanish  . Carotid artery occlusion   . Cervical spine fracture ([place])   . COPD (chronic obstructive pulmonary disease) ([place])   . Depression   . Diverticulosis   . Dyslipidemia   . Gastritis 09/15/1991  . GERD (gastroesophageal reflux disease) 09/15/1991   Dr Sharlett Iles  . Hiatal hernia 09/15/1991  . Hip fracture, right ([place])   . HLD (hyperlipidemia)   . HTN (hypertension)   . Hyperplastic polyps of stomach 11/2007   colonoscopy  . Hypertension   . Hypothyroidism    affecting the left eye, proptosis  . Iron deficiency anemia   . Macular degeneration of left eye   . Memory loss   . Mesenteric artery stenosis ([place])   . 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 Myocardial infarction ([place]) 1993  . Ocular myasthenia gravis ([place])    Dr Jannifer Franklin  . Orthostatic hypotension 06/05/2013  . Pneumonia    hx  . PONV (postoperative nausea and vomiting)   . Shortness of breath dyspnea    occ  . Strabismus    left eye  . Syncope 1998    Tobacco History: Social History   Tobacco Use  Smoking Status Current Some Day Smoker  . Packs/day: 1.00  . Years: 60.00  . Pack years: 60.00  . Types: Cigarettes  Smokeless Tobacco Never Used  Tobacco Comment   3-10 cigarettes a day 05/21/2019   Ready to quit: Yes Counseling given: Yes Comment: 3-10 cigarettes a day 05/21/2019  Smoking assessment and cessation counseling  Patient currently smoking: 3 to 10 cigarettes a day I have advised the patient to quit/stop smoking as soon as possible due to high risk for multiple medical problems.  It will also be very difficult for [place] to manage patient's  respiratory symptoms and status if we continue to expose her lungs to a known irritant.  We do not advise e-cigarettes as a form of stopping smoking.  Patient is not willing to quit smoking.  I have  advised the patient that we can assist and have options of nicotine replacement therapy, provided smoking cessation education today, provided smoking cessation counseling, and provided cessation resources.  Patient is willing to try nicotine replacement therapies patches and lozenges.  She typically needs a cigarette within the first 30 minutes of waking up.  Follow-up next office visit office visit for assessment of smoking cessation.    Smoking cessation counseling advised for: 15 min    Outpatient Encounter Medications as of 05/21/2019  Medication Sig  . amLODipine (NORVASC) 5 MG tablet TAKE 1 TABLET BY MOUTH EVERY DAY  . aspirin 81 MG tablet Take 81 mg by mouth daily.    . bisacodyl (DULCOLAX) 5 MG EC tablet Take 15 mg by mouth at bedtime.   Marland Kitchen  Calcium-Magnesium-Vitamin D (CALCIUM 1200+D3 PO) Take 1 tablet by mouth daily.  . clonazePAM (KLONOPIN) 0.5 MG tablet TAKE 1 TABLET BY MOUTH EVERYDAY AT BEDTIME  . clopidogrel (PLAVIX) 75 MG tablet TAKE 1 TABLET BY MOUTH EVERY DAY  . donepezil (ARICEPT) 10 MG tablet TAKE 1 TABLET BY MOUTH EVERYDAY AT BEDTIME  . DULoxetine (CYMBALTA) 60 MG capsule TAKE 1 CAPSULE BY MOUTH EVERY DAY  . gabapentin (NEURONTIN) 300 MG capsule TAKE ONE CAPSULE TWICE DAILY AND 2 AT NIGHT = FOUR TOTAL DAILY.  Marland Kitchen levothyroxine (SYNTHROID) 125 MCG tablet TAKE 1 TABLET (125 MCG TOTAL) BY MOUTH DAILY BEFORE BREAKFAST.  Marland Kitchen lisinopril (ZESTRIL) 5 MG tablet Take 1 tablet (5 mg total) by mouth daily.  . Multiple Vitamins-Minerals (PRESERVISION AREDS 2 PO) Take 1 capsule by mouth 2 (two) times daily.  Marland Kitchen oxyCODONE (OXY IR/ROXICODONE) 5 MG immediate release tablet Take 1 tablet (5 mg total) by mouth every 4 (four) hours as needed for moderate pain.  . pravastatin (PRAVACHOL) 40 MG tablet TAKE 1 TABLET BY MOUTH EVERY DAY  . PROLIA 60 MG/ML SOSY injection Inject 1 each into the skin every 6 (six) months.  . umeclidinium-vilanterol (ANORO ELLIPTA) 62.5-25 MCG/INH AEPB Inhale 1 puff into  the lungs daily.  . famotidine (PEPCID) 20 MG tablet Take 1 tablet (20 mg total) by mouth at bedtime.  . nicotine (NICODERM CQ) 21 mg/24hr patch Place 1 patch (21 mg total) onto the skin daily.  . nicotine polacrilex (COMMIT) 4 MG lozenge Take 1 lozenge (4 mg total) by mouth as needed for smoking cessation.  Marland Kitchen umeclidinium-vilanterol (ANORO ELLIPTA) 62.5-25 MCG/INH AEPB Inhale 1 puff into the lungs daily.   No facility-administered encounter medications on file as of 05/21/2019.      Review of Systems  Review of Systems  Constitutional: Positive for fatigue. Negative for activity change and fever.  HENT: Negative for sinus pressure, sinus pain and sore throat.   Respiratory: Positive for cough and wheezing. Negative for shortness of breath.   Cardiovascular: Negative for chest pain and palpitations.  Gastrointestinal: Negative for diarrhea, nausea and vomiting.  Musculoskeletal: Negative for arthralgias.  Neurological: Negative for dizziness.  Psychiatric/Behavioral: Negative for sleep disturbance. The patient is not nervous/anxious.      Physical Exam  BP 124/82 (BP Location: Right Arm, Patient Position: Sitting, Cuff Size: Normal)   Pulse 72   Temp (!) 97.1 F (36.2 C) (Temporal)   Ht 5\' 4"  (1.626 m)   Wt 126 lb (57.2 kg)   SpO2 100%   BMI 21.63 kg/m   Wt Readings from Last 5 Encounters:  05/21/19 126 lb (57.2 kg)  02/25/19 122 lb 9.6 oz (55.6 kg)  01/02/19 122 lb (55.3 kg)  11/29/18 120 lb 9.6 oz (54.7 kg)  10/14/18 117 lb (53.1 kg)    BMI Readings from Last 5 Encounters:  05/21/19 21.63 kg/m  02/25/19 21.04 kg/m  02/20/19 20.94 kg/m  01/02/19 20.94 kg/m  11/29/18 20.70 kg/m    Physical Exam Vitals signs and nursing note reviewed.  Constitutional:      General: She is not in acute distress.    Appearance: Normal appearance.     Comments: Chronically ill elderly female  HENT:     Head: Normocephalic and atraumatic.     Right Ear: Tympanic membrane, ear  canal and external ear normal. There is no impacted cerumen.     Left Ear: Tympanic membrane, ear canal and external ear normal. There is no impacted cerumen.  Nose: Nose normal. No congestion.     Mouth/Throat:     Mouth: Mucous membranes are dry.     Pharynx: Oropharynx is clear.  Eyes:     Pupils: Pupils are equal, round, and reactive to light.  Neck:     Musculoskeletal: Normal range of motion.  Cardiovascular:     Rate and Rhythm: Normal rate and regular rhythm.     Pulses: Normal pulses.     Heart sounds: Normal heart sounds. No murmur.  Pulmonary:     Effort: Pulmonary effort is normal. No respiratory distress.     Breath sounds: No decreased air movement. No decreased breath sounds, wheezing or rales.     Comments: Diminished breath sounds throughout exam Abdominal:     General: Abdomen is flat. Bowel sounds are normal.     Palpations: Abdomen is soft.  Skin:    General: Skin is warm and dry.     Capillary Refill: Capillary refill takes less than 2 seconds.  Neurological:     General: No focal deficit present.     Mental Status: She is alert and oriented to person, place, and time. Mental status is at baseline.     Gait: Gait normal.  Psychiatric:        Mood and Affect: Mood normal.        Behavior: Behavior normal.        Thought Content: Thought content normal.        Judgment: Judgment normal.       Assessment & Plan:   COPD (chronic obstructive pulmonary disease) with emphysema (HCC) Plan: Continue Anoro Ellipta Emphasized need for patient to stop smoking Walk today in office patient required 3 L via POC When at home patient should use 2 L continuous with physical exertion Patient does not require oxygen at rest Will order overnight oximetry to evaluate oxygen needs at night [facility] to help patient with medication access calendar year 2021 Follow-up in 1 week with clinical pharmacy team for smoking cessation Follow-up in  our office in 4 weeks  Right lower lobe lung mass Plan: Continue follow-up with Dr. Earlie Server with oncology Continue follow-up with Dr. Sondra Come with oncology  GERD (gastroesophageal reflux disease) Breakthrough daily acid reflux Poor diet, eats late at night, snacks, Pepsi and chips at night  Plan: Start Pepcid 20 mg at night Can use Tums for breakthrough acid reflux If acid reflux persist can start Pepcid 20 mg every 12 hours Schedule follow-up with primary care  [employer] Plan: Emphasized need for the patient to stop smoking Patient needs to set a quit date Patient is willing to use nicotine replacement therapies Prescription sent for 21 mg nicotine patch Prescription sent for 4 mg nicotine lozenge 1 week follow-up in clinic with clinical pharmacist for smoking cessation 4-week follow-up in office  Chronic respiratory failure with hypoxia ([place]) Walk in office today Patient required 3 L via POC  Plan: Order placed for POC today for patient Needs to use 2 L continuous with physical exertion or 3 L pulsed Overnight oximetry ordered today to assess oxygen needs at night Patient does not need oxygen at rest  Medication management Patient likes Anoro Ellipta Patient reports the cost of Anoro Ellipta have gone up over the last few months now it is over $100 a month Patient likely in donut hole or the coverage  Plan: [facility] to help patient with medication access Could consider additional Lama LABA medications  if there are others that are more affordable for the patient Patient likely needs to be considered for [place] for you program for calendar year 2021  Patient to schedule appointment with clinical pharmacist in our office to review inhaler technique as well as smoking cessation    Return in about 4 weeks (around 06/18/2019), or if symptoms worsen or fail to improve, for Follow up with Dr. Vaughan Browner, Follow up with Wyn Quaker FNP-C.    Lauraine Rinne, NP 05/21/2019   This appointment was 45 minutes long with over 50% of the time in direct face-to-face patient care, assessment, plan of care, and follow-up.

## 2019-05-21 ENCOUNTER — Other Ambulatory Visit: Payer: Self-pay

## 2019-05-21 ENCOUNTER — Encounter: Payer: Self-pay | Admitting: Pulmonary Disease

## 2019-05-21 ENCOUNTER — Ambulatory Visit (INDEPENDENT_AMBULATORY_CARE_PROVIDER_SITE_OTHER): Payer: PPO | Admitting: Pulmonary Disease

## 2019-05-21 ENCOUNTER — Ambulatory Visit: Payer: PPO | Admitting: Pulmonary Disease

## 2019-05-21 VITALS — BP 124/82 | HR 72 | Temp 97.1°F | Ht 64.0 in | Wt 126.0 lb

## 2019-05-21 DIAGNOSIS — J9611 Chronic respiratory failure with hypoxia: Secondary | ICD-10-CM | POA: Insufficient documentation

## 2019-05-21 DIAGNOSIS — K219 Gastro-esophageal reflux disease without esophagitis: Secondary | ICD-10-CM | POA: Diagnosis not present

## 2019-05-21 DIAGNOSIS — F17200 Nicotine dependence, unspecified, uncomplicated: Secondary | ICD-10-CM | POA: Diagnosis not present

## 2019-05-21 DIAGNOSIS — Z79899 Other long term (current) drug therapy: Secondary | ICD-10-CM

## 2019-05-21 DIAGNOSIS — J439 Emphysema, unspecified: Secondary | ICD-10-CM | POA: Diagnosis not present

## 2019-05-21 DIAGNOSIS — R918 Other nonspecific abnormal finding of lung field: Secondary | ICD-10-CM

## 2019-05-21 LAB — PULMONARY FUNCTION TEST
DL/VA % pred: 55 %
DL/VA: 2.24 ml/min/mmHg/L
DLCO unc % pred: 39 %
DLCO unc: 7.34 ml/min/mmHg
FEF 25-75 Post: 0.39 L/sec
FEF 25-75 Pre: 0.33 L/sec
FEF2575-%Change-Post: 15 %
FEF2575-%Pred-Post: 30 %
FEF2575-%Pred-Pre: 26 %
FEV1-%Change-Post: -2 %
FEV1-%Pred-Post: 51 %
FEV1-%Pred-Pre: 53 %
FEV1-Post: 0.95 L
FEV1-Pre: 0.98 L
FEV1FVC-%Change-Post: -3 %
FEV1FVC-%Pred-Pre: 74 %
FEV6-%Change-Post: 1 %
FEV6-%Pred-Post: 75 %
FEV6-%Pred-Pre: 74 %
FEV6-Post: 1.76 L
FEV6-Pre: 1.73 L
FEV6FVC-%Change-Post: 0 %
FEV6FVC-%Pred-Post: 103 %
FEV6FVC-%Pred-Pre: 102 %
FVC-%Change-Post: 0 %
FVC-%Pred-Post: 73 %
FVC-%Pred-Pre: 72 %
FVC-Post: 1.81 L
FVC-Pre: 1.8 L
Post FEV1/FVC ratio: 53 %
Post FEV6/FVC ratio: 97 %
Pre FEV1/FVC ratio: 54 %
Pre FEV6/FVC Ratio: 97 %
RV % pred: 109 %
RV: 2.68 L
TLC % pred: 91 %
TLC: 4.61 L

## 2019-05-21 MED ORDER — FAMOTIDINE 20 MG PO TABS: 20.0000 mg | ORAL_TABLET | Freq: Every day | ORAL | 2 refills | Status: DC

## 2019-05-21 MED ORDER — NICOTINE POLACRILEX 4 MG MT LOZG: 4.0000 mg | LOZENGE | OROMUCOSAL | 2 refills | Status: DC | PRN

## 2019-05-21 MED ORDER — NICOTINE 21 MG/24HR TD PT24: 21.0000 mg | MEDICATED_PATCH | Freq: Every day | TRANSDERMAL | 3 refills | Status: DC

## 2019-05-21 MED ORDER — ANORO ELLIPTA 62.5-25 MCG/INH IN AEPB: 1.0000 | INHALATION_SPRAY | Freq: Every day | RESPIRATORY_TRACT | 0 refills | Status: DC

## 2019-05-21 NOTE — Patient Instructions (Addendum)
You were seen today by Lauraine Rinne, NP  for:   1. Pulmonary emphysema, unspecified emphysema type ([place])  - AMB Referral to [facility]  Anoro Ellipta  >>> Take 1 puff daily in the morning right when you wake up >>>Rinse your mouth out after use >>>This is a daily maintenance inhaler, NOT a rescue inhaler >>>Contact our office if you are having difficulties affording or obtaining this medication >>>It is important for you to be able to take this daily and not miss any doses   Only use your albuterol as a rescue medication to be used if you can't catch your breath by resting or doing a relaxed purse lip breathing pattern.  - The less you use it, the better it will work when you need it. - Ok to use up to 2 puffs  every 4 hours if you must but call for immediate appointment if use goes up over your usual need - Don't leave home without it !!  (think of it like the spare tire for your car)   Note your daily symptoms > remember "red flags" for COPD:   >>>Increase in cough >>>increase in sputum production >>>increase in shortness of breath or activity  intolerance.   If you notice these symptoms, please call the office to be seen.   Walk today in office  Continue oxygen therapy as prescribed  >>>maintain oxygen saturations greater than 88 percent  >>>if unable to maintain oxygen saturations please contact the office  >>>do not smoke with oxygen  >>>can use nasal saline gel or nasal saline rinses to moisturize nose if oxygen causes dryness   2. Gastroesophageal reflux disease, unspecified whether esophagitis present  - famotidine (PEPCID) 20 MG tablet; Take 1 tablet (20 mg total) by mouth at bedtime.  Dispense: 30 tablet; Refill: 2  GERD management: >>>Avoid laying flat until 2 hours after meals >>>Elevate head of the bed including entire chest >>>Reduce size of meals and amount of fat, acid, spices, caffeine and sweets >>>If you are smoking, Please stop! >>>Decrease  alcohol consumption >>>Work on maintaining a healthy weight with normal BMI     3. CIGARETTE SMOKER  - nicotine (NICODERM CQ) 21 mg/24hr patch; Place 1 patch (21 mg total) onto the skin daily.  Dispense: 28 patch; Refill: 3 - nicotine polacrilex (COMMIT) 4 MG lozenge; Take 1 lozenge (4 mg total) by mouth as needed for smoking cessation.  Dispense: 100 tablet; Refill: 2  Schedule follow up appt with clinical pharmacy team within next week   We recommend that you stop smoking.  >>>You need to set a quit date >>>If you have friends or family who smoke, let them know you are trying to quit and not to smoke around you or in your living environment  Smoking Cessation Resources:  1 800 QUIT NOW  >>> Patient to call this resource and utilize it to help support her quit smoking >>> Keep up your hard work with stopping smoking  You can also contact the [facility] >>>For smoking cessation classes call [phone]  We do not recommend using e-cigarettes as a form of stopping smoking  You can sign up for smoking cessation support texts and information:  >>>https://smokefree.gov/smokefreetxt   4. Chronic Respiratory Failure   Continue oxygen therapy as prescribed  -  >>>maintain oxygen saturations greater than 88 percent  >>>if unable to maintain oxygen saturations please contact the office  >>>do not smoke with oxygen  >>>can use nasal saline gel or  nasal saline rinses to moisturize nose if oxygen causes dryness    We recommend today:  Orders Placed This Encounter  Procedures  . AMB Referral to [facility]    Referral Priority:   Routine    Referral Type:   Consultation    Referral Reason:   [facility]    Number of Visits Requested:   1   Orders Placed This Encounter  Procedures  . AMB Referral to [facility]   Meds ordered this encounter  Medications  . nicotine (NICODERM CQ) 21 mg/24hr patch    Sig: Place 1 patch (21 mg total) onto  the skin daily.    Dispense:  28 patch    Refill:  3  . nicotine polacrilex (COMMIT) 4 MG lozenge    Sig: Take 1 lozenge (4 mg total) by mouth as needed for smoking cessation.    Dispense:  100 tablet    Refill:  2  . famotidine (PEPCID) 20 MG tablet    Sig: Take 1 tablet (20 mg total) by mouth at bedtime.    Dispense:  30 tablet    Refill:  2    Follow Up:    1 week follow-up with clinical pharmacy team for smoking cessation and inhaler teaching  Return in about 4 weeks (around 06/18/2019) for Follow up with Dr. Vaughan Browner, Follow up with Wyn Quaker FNP-C.   Please do your part to reduce the spread of COVID-19:      Reduce your risk of any infection  and COVID19 by using the similar precautions used for avoiding the common cold or flu:  Marland Kitchen Wash your hands often with soap and warm water for at least 20 seconds.  If soap and water are not readily available, use an alcohol-based hand sanitizer with at least 60% alcohol.  . If coughing or sneezing, cover your mouth and nose by coughing or sneezing into the elbow areas of your shirt or coat, into a tissue or into your sleeve (not your hands). Langley Gauss A MASK when in public  . Avoid shaking hands with others and consider head nods or verbal greetings only. . Avoid touching your eyes, nose, or mouth with unwashed hands.  . Avoid close contact with people who are sick. . Avoid places or events with large numbers of people in one location, like concerts or sporting events. . If you have some symptoms but not all symptoms, continue to monitor at home and seek medical attention if your symptoms worsen. . If you are having a medical emergency, call 911.   [place] / e-Visit: [email or web address]         [facility]: [facility]: [phone]                   [facility]: [phone]      It is flu season:   >>> Best ways to protect herself from the flu: Receive the yearly flu vaccine, practice good hand hygiene washing with soap and also using hand sanitizer when available, eat a nutritious meals, get adequate rest, hydrate appropriately   Please contact the office if your symptoms worsen or you have concerns that you are not improving.   Thank you for choosing [facility] for your healthcare, and for allowing [place] to partner with you on your healthcare journey. I am thankful to be able to provide care to you today.  Wyn Quaker FNP-C   Chronic Obstructive Pulmonary Disease Chronic obstructive pulmonary disease (COPD) is a long-term (chronic) lung problem. When you have COPD, it is hard for air to get in and out of your lungs. Usually the condition gets worse over time, and your lungs will never return to normal. There are things you can do to keep yourself as healthy as possible.  Your doctor may treat your condition with: ? Medicines. ? Oxygen. ? Lung surgery.  Your doctor may also recommend: ? Rehabilitation. This includes steps to make your body work better. It may involve a team of specialists. ? Quitting smoking, if you smoke. ? Exercise and changes to your diet. ? Comfort measures (palliative care). Follow these instructions at home: Medicines  Take over-the-counter and prescription medicines only as told by your doctor.  Talk to your doctor before taking any cough or allergy medicines. You may need to avoid medicines that cause your lungs to be dry. Lifestyle  If you smoke, stop. Smoking makes the problem worse. If you need help quitting, ask your doctor.  Avoid being around things that make your breathing worse. This may include smoke, chemicals, and fumes.  Stay active, but remember to rest as well.  Learn and use tips on how to relax.  Make sure you get enough sleep. Most adults need at least 7 hours of sleep every night.  Eat healthy  foods. Eat smaller meals more often. Rest before meals. Controlled breathing Learn and use tips on how to control your breathing as told by your doctor. Try:  Breathing in (inhaling) through your nose for 1 second. Then, pucker your lips and breath out (exhale) through your lips for 2 seconds.  Putting one hand on your belly (abdomen). Breathe in slowly through your nose for 1 second. Your hand on your belly should move out. Pucker your lips and breathe out slowly through your lips. Your hand on your belly should move in as you breathe out.  Controlled coughing Learn and use controlled coughing to clear mucus from your lungs. Follow these steps: 1. Lean your head a little forward. 2. Breathe in deeply. 3. Try to hold your breath for 3 seconds. 4. Keep your mouth slightly open while coughing 2 times. 5. Spit any mucus out into a tissue. 6. Rest and do the steps again 1 or 2 times as needed. General instructions  Make sure you get all the shots (vaccines) that your doctor recommends. Ask your doctor about a flu shot and a pneumonia shot.  Use oxygen therapy and pulmonary rehabilitation if told by your doctor. If you need home oxygen therapy, ask your doctor if you should buy a tool to measure your oxygen level (oximeter).  Make a COPD action plan with your doctor. This helps you to know what to do if you feel worse than usual.  Manage any other conditions you have as told by your doctor.  Avoid going outside when it is very hot, cold, or humid.  Avoid people who have a sickness you can catch (contagious).  Keep all follow-up visits as told by your doctor. This is important. Contact a doctor if:  You cough up more mucus than usual.  There is a change in the color or thickness of the mucus.  It is harder to breathe than usual.  Your breathing is faster than usual.  You have trouble sleeping.  You need to use your medicines more often than usual.  You have trouble doing your  normal activities such as getting dressed or walking around the house. Get help right away if:  You have shortness of breath while resting.  You have shortness of breath that stops you from: ? Being able to talk. ? Doing normal activities.  Your chest hurts for longer than 5 minutes.  Your skin color is more blue than usual.  Your pulse oximeter shows that you have low oxygen for longer than 5 minutes.  You have a fever.  You feel too tired to breathe normally. Summary  Chronic obstructive pulmonary disease (COPD) is a long-term lung problem.  The way your lungs work will never return to normal. Usually the condition gets worse over time. There are things you can do to keep yourself as healthy as possible.  Take over-the-counter and prescription medicines only as told by your doctor.  If you smoke, stop. Smoking makes the problem worse. This information is not intended to replace advice given to you by your health care provider. Make sure you discuss any questions you have with your health care provider. Document Released: 12/20/2007 Document Revised: 06/15/2017 Document Reviewed: 08/07/2016 Elsevier Patient Education  2020 [place] Risks of Smoking Smoking cigarettes is very bad for your health. Tobacco smoke has over 200 known poisons in it. It contains the poisonous gases nitrogen oxide and carbon monoxide. There are over 60 chemicals in tobacco smoke that cause cancer. Smoking is difficult to quit because a chemical in tobacco, called nicotine, causes addiction or dependence. When you smoke and inhale, nicotine is absorbed rapidly into the bloodstream through your lungs. Both inhaled and non-inhaled nicotine may be addictive. What are the risks of cigarette smoke? Cigarette smokers have an increased risk of many serious medical problems, including:  Lung cancer.  Lung disease, such as pneumonia, bronchitis, and emphysema.  Chest pain (angina) and heart  attack because the heart is not getting enough oxygen.  Heart disease and peripheral blood vessel disease.  High blood pressure (hypertension).  Stroke.  Oral cancer, including cancer of the lip, mouth, or voice box.  Bladder cancer.  Pancreatic cancer.  Cervical cancer.  Pregnancy complications, including premature birth.  Stillbirths and smaller newborn babies, birth defects, and genetic damage to sperm.  Early menopause.  Lower estrogen level for women.  Infertility.  Facial wrinkles.  Blindness.  Increased risk of broken bones (fractures).  Senile dementia.  Stomach ulcers and internal bleeding.  Delayed wound healing and increased risk of complications during surgery.  Even smoking lightly shortens your life expectancy by several years. Because of secondhand smoke exposure, children of smokers have an increased risk of the following:  Sudden infant death syndrome (SIDS).  Respiratory infections.  Lung cancer.  Heart disease.  Ear infections. What are the benefits of quitting? There are many health benefits of quitting smoking. Here are some of them:  Within days of quitting smoking, your risk of having a heart attack decreases, your blood flow improves, and your lung capacity improves. Blood pressure, pulse rate, and breathing patterns start returning to normal soon after quitting.  Within months, your lungs may clear up completely.  Quitting for 10 years reduces your risk of developing lung cancer and heart disease to almost that of a nonsmoker.  People who quit may see an improvement in their overall quality of life. How do I quit smoking?     Smoking is an addiction with both physical and psychological effects, and longtime habits can be hard to change. Your  health care provider can recommend:  Programs and community resources, which may include group support, education, or talk therapy.  Prescription medicines to help reduce cravings.   Nicotine replacement products, such as patches, gum, and nasal sprays. Use these products only as directed. Do not replace cigarette smoking with electronic cigarettes, which are commonly called e-cigarettes. The safety of e-cigarettes is not known, and some may contain harmful chemicals.  A combination of two or more of these methods. Where to find more information  American Lung Association: www.lung.org  American Cancer Society: www.cancer.org Summary  Smoking cigarettes is very bad for your health. Cigarette smokers have an increased risk of many serious medical problems, including several cancers, heart disease, and stroke.  Smoking is an addiction with both physical and psychological effects, and longtime habits can be hard to change.  By stopping right away, you can greatly reduce the risk of medical problems for you and your family.  To help you quit smoking, your health care provider can recommend programs, community resources, prescription medicines, and nicotine replacement products such as patches, gum, and nasal sprays. This information is not intended to replace advice given to you by your health care provider. Make sure you discuss any questions you have with your health care provider. Document Released: 08/10/2004 Document Revised: 10/04/2017 Document Reviewed: 07/07/2016 Elsevier Patient Education  2020 [employer].    Steps to Quit Smoking Smoking tobacco is the leading cause of preventable death. It can affect almost every organ in the body. Smoking puts you and people around you at risk for many serious, long-lasting (chronic) diseases. Quitting smoking can be hard, but it is one of the best things that you can do for your health. It is never too late to quit. How do I get ready to quit? When you decide to quit smoking, make a plan to help you succeed. Before you quit:  Pick a date to quit. Set a date within the next 2 weeks to give you time to prepare.  Write down  the reasons why you are quitting. Keep this list in places where you will see it often.  Tell your family, friends, and co-workers that you are quitting. Their support is important.  Talk with your doctor about the choices that may help you quit.  Find out if your health insurance will pay for these treatments.  Know the people, places, things, and activities that make you want to smoke (triggers). Avoid them. What first steps can I take to quit smoking?  Throw away all cigarettes at home, at work, and in your car.  Throw away the things that you use when you smoke, such as ashtrays and lighters.  Clean your car. Make sure to empty the ashtray.  Clean your home, including curtains and carpets. What can I do to help me quit smoking? Talk with your doctor about taking medicines and seeing a counselor at the same time. You are more likely to succeed when you do both.  If you are pregnant or breastfeeding, talk with your doctor about counseling or other ways to quit smoking. Do not take medicine to help you quit smoking unless your doctor tells you to do so. To quit smoking: Quit right away  Quit smoking totally, instead of slowly cutting back on how much you smoke over a period of time.  Go to counseling. You are more likely to quit if you go to counseling sessions regularly. Take medicine You may take medicines to help you quit.  Some medicines need a prescription, and some you can buy over-the-counter. Some medicines may contain a drug called nicotine to replace the nicotine in cigarettes. Medicines may:  Help you to stop having the desire to smoke (cravings).  Help to stop the problems that come when you stop smoking (withdrawal symptoms). Your doctor may ask you to use:  Nicotine patches, gum, or lozenges.  Nicotine inhalers or sprays.  Non-nicotine medicine that is taken by mouth. Find resources Find resources and other ways to help you quit smoking and remain smoke-free  after you quit. These resources are most helpful when you use them often. They include:  Online chats with a Social worker.  Phone quitlines.  Printed Furniture conservator/restorer.  Support groups or group counseling.  Text messaging programs.  Mobile phone apps. Use apps on your mobile phone or tablet that can help you stick to your quit plan. There are many free apps for mobile phones and tablets as well as websites. Examples include Quit Guide from the [employer] and smokefree.gov  What things can I do to make it easier to quit?   Talk to your family and friends. Ask them to support and encourage you.  Call a phone quitline (1-800-QUIT-NOW), reach out to support groups, or work with a Social worker.  Ask people who smoke to not smoke around you.  Avoid places that make you want to smoke, such as: ? Bars. ? Parties. ? Smoke-break areas at work.  Spend time with people who do not smoke.  Lower the stress in your life. Stress can make you want to smoke. Try these things to help your stress: ? Getting regular exercise. ? Doing deep-breathing exercises. ? Doing yoga. ? Meditating. ? Doing a body scan. To do this, close your eyes, focus on one area of your body at a time from head to toe. Notice which parts of your body are tense. Try to relax the muscles in those areas. How will I feel when I quit smoking? Day 1 to 3 weeks Within the first 24 hours, you may start to have some problems that come from quitting tobacco. These problems are very bad 2-3 days after you quit, but they do not often last for more than 2-3 weeks. You may get these symptoms:  Mood swings.  Feeling restless, nervous, angry, or annoyed.  Trouble concentrating.  Dizziness.  Strong desire for high-sugar foods and nicotine.  Weight gain.  Trouble pooping (constipation).  Feeling like you may vomit (nausea).  Coughing or a sore throat.  Changes in how the medicines that you take for other issues work in your body.   Depression.  Trouble sleeping (insomnia). Week 3 and afterward After the first 2-3 weeks of quitting, you may start to notice more positive results, such as:  Better sense of smell and taste.  Less coughing and sore throat.  Slower heart rate.  Lower blood pressure.  Clearer skin.  Better breathing.  Fewer sick days. Quitting smoking can be hard. Do not give up if you fail the first time. Some people need to try a few times before they succeed. Do your best to stick to your quit plan, and talk with your doctor if you have any questions or concerns. Summary  Smoking tobacco is the leading cause of preventable death. Quitting smoking can be hard, but it is one of the best things that you can do for your health.  When you decide to quit smoking, make a plan to help you succeed.  Quit smoking right away, not slowly over a period of time.  When you start quitting, seek help from your doctor, family, or friends. This information is not intended to replace advice given to you by your health care provider. Make sure you discuss any questions you have with your health care provider. Document Released: 04/29/2009 Document Revised: 09/20/2018 Document Reviewed: 09/21/2018 Elsevier Patient Education  2020 [employer].

## 2019-05-21 NOTE — Assessment & Plan Note (Addendum)
Plan: Continue Anoro Ellipta Emphasized need for patient to stop smoking Walk today in office patient required 3 L via POC When at home patient should use 2 L continuous with physical exertion Patient does not require oxygen at rest Will order overnight oximetry to evaluate oxygen needs at night [facility] to help patient with medication access calendar year 2021 Follow-up in 1 week with clinical pharmacy team for smoking cessation Follow-up in our office in 4 weeks

## 2019-05-21 NOTE — Assessment & Plan Note (Signed)
Plan: Emphasized need for the patient to stop smoking Patient needs to set a quit date Patient is willing to use nicotine replacement therapies Prescription sent for 21 mg nicotine patch Prescription sent for 4 mg nicotine lozenge 1 week follow-up in clinic with clinical pharmacist for smoking cessation 4-week follow-up in office

## 2019-05-21 NOTE — Assessment & Plan Note (Addendum)
Patient likes Brandi Raymond Patient reports the cost of Anoro Ellipta have gone up over the last few months now it is over $100 a month Patient likely in donut hole or the coverage  Plan: [facility] to help patient with medication access Could consider additional Lama LABA medications if there are others that are more affordable for the patient Patient likely needs to be considered for [place] for you program for calendar year 2021  Patient to schedule appointment with clinical pharmacist in our office to review inhaler technique as well as smoking cessation

## 2019-05-21 NOTE — Assessment & Plan Note (Signed)
Walk in office today Patient required 3 L via POC  Plan: Order placed for POC today for patient Needs to use 2 L continuous with physical exertion or 3 L pulsed Overnight oximetry ordered today to assess oxygen needs at night Patient does not need oxygen at rest

## 2019-05-21 NOTE — Assessment & Plan Note (Signed)
Breakthrough daily acid reflux Poor diet, eats late at night, snacks, Pepsi and chips at night  Plan: Start Pepcid 20 mg at night Can use Tums for breakthrough acid reflux If acid reflux persist can start Pepcid 20 mg every 12 hours Schedule follow-up with primary care

## 2019-05-21 NOTE — Assessment & Plan Note (Signed)
Plan: Continue follow-up with Dr. Earlie Server with oncology Continue follow-up with Dr. Sondra Come with oncology

## 2019-05-21 NOTE — Progress Notes (Signed)
PFT done today. 

## 2019-05-22 ENCOUNTER — Encounter: Payer: Self-pay | Admitting: Radiation Oncology

## 2019-05-22 ENCOUNTER — Other Ambulatory Visit: Payer: Self-pay

## 2019-05-22 ENCOUNTER — Ambulatory Visit
Admission: RE | Admit: 2019-05-22 | Discharge: 2019-05-22 | Disposition: A | Discharge status: Home / Self Care | Payer: PPO | Source: Ambulatory Visit | Attending: Radiation Oncology | Admitting: Radiation Oncology

## 2019-05-22 ENCOUNTER — Encounter: Payer: Self-pay | Admitting: Internal Medicine

## 2019-05-22 ENCOUNTER — Inpatient Hospital Stay: Payer: PPO | Attending: Internal Medicine | Admitting: Internal Medicine

## 2019-05-22 VITALS — BP 135/65 | HR 75 | Temp 98.3°F | Resp 18 | Ht 60.0 in | Wt 126.5 lb

## 2019-05-22 VITALS — BP 139/60 | HR 71 | Temp 98.0°F | Resp 17 | Ht 64.0 in | Wt 126.6 lb

## 2019-05-22 DIAGNOSIS — C3431 Malignant neoplasm of lower lobe, right bronchus or lung: Secondary | ICD-10-CM | POA: Insufficient documentation

## 2019-05-22 DIAGNOSIS — Z7982 Long term (current) use of aspirin: Secondary | ICD-10-CM | POA: Diagnosis not present

## 2019-05-22 DIAGNOSIS — Z85118 Personal history of other malignant neoplasm of bronchus and lung: Secondary | ICD-10-CM | POA: Insufficient documentation

## 2019-05-22 DIAGNOSIS — K449 Diaphragmatic hernia without obstruction or gangrene: Secondary | ICD-10-CM | POA: Insufficient documentation

## 2019-05-22 DIAGNOSIS — Z9981 Dependence on supplemental oxygen: Secondary | ICD-10-CM | POA: Diagnosis not present

## 2019-05-22 DIAGNOSIS — M8580 Other specified disorders of bone density and structure, unspecified site: Secondary | ICD-10-CM | POA: Insufficient documentation

## 2019-05-22 DIAGNOSIS — J439 Emphysema, unspecified: Secondary | ICD-10-CM | POA: Insufficient documentation

## 2019-05-22 DIAGNOSIS — Z79899 Other long term (current) drug therapy: Secondary | ICD-10-CM | POA: Diagnosis not present

## 2019-05-22 DIAGNOSIS — C3490 Malignant neoplasm of unspecified part of unspecified bronchus or lung: Secondary | ICD-10-CM | POA: Diagnosis not present

## 2019-05-22 DIAGNOSIS — C3491 Malignant neoplasm of unspecified part of right bronchus or lung: Secondary | ICD-10-CM | POA: Diagnosis not present

## 2019-05-22 DIAGNOSIS — I10 Essential (primary) hypertension: Secondary | ICD-10-CM

## 2019-05-22 DIAGNOSIS — Z923 Personal history of irradiation: Secondary | ICD-10-CM | POA: Diagnosis not present

## 2019-05-22 DIAGNOSIS — R0602 Shortness of breath: Secondary | ICD-10-CM | POA: Insufficient documentation

## 2019-05-22 DIAGNOSIS — Z08 Encounter for follow-up examination after completed treatment for malignant neoplasm: Secondary | ICD-10-CM | POA: Diagnosis not present

## 2019-05-22 NOTE — Patient Instructions (Signed)
Coronavirus (COVID-19) Are you at risk?  Are you at risk for the Coronavirus (COVID-19)?  To be considered HIGH RISK for Coronavirus (COVID-19), you have to meet the following criteria:  . Traveled to [place], [place], [place], [place] or [place]; or in the [place] to [place], [place], [place], or [place]; and have fever, cough, and shortness of breath within the last 2 weeks of travel OR . Been in close contact with a person diagnosed with COVID-19 within the last 2 weeks and have fever, cough, and shortness of breath . IF YOU DO NOT MEET THESE CRITERIA, YOU ARE CONSIDERED LOW RISK FOR COVID-19.  What to do if you are HIGH RISK for COVID-19?  . If you are having a medical emergency, call 911. . Seek medical care right away. Before you go to a doctor's office, urgent care or emergency department, call ahead and tell them about your recent travel, contact with someone diagnosed with COVID-19, and your symptoms. You should receive instructions from your physician's office regarding next steps of care.  . When you arrive at healthcare provider, tell the healthcare staff immediately you have returned from visiting [place], [place], [place], [place] or [place]; or traveled in the [place] to [place], [place], [place], or [place]; in the last two weeks or you have been in close contact with a person diagnosed with COVID-19 in the last 2 weeks.   . Tell the health care staff about your symptoms: fever, cough and shortness of breath. . After you have been seen by a medical provider, you will be either: o Tested for (COVID-19) and discharged home on quarantine except to seek medical care if symptoms worsen, and asked to  - Stay home and avoid contact with others until you get your results (4-5 days)  - Avoid travel on public transportation if possible (such as bus, train, or airplane) or o Sent to the Emergency Department by EMS for evaluation, COVID-19 testing, and possible  admission depending on your condition and test results.  What to do if you are LOW RISK for COVID-19?  Reduce your risk of any infection by using the same precautions used for avoiding the common cold or flu:  . Wash your hands often with soap and warm water for at least 20 seconds.  If soap and water are not readily available, use an alcohol-based hand sanitizer with at least 60% alcohol.  . If coughing or sneezing, cover your mouth and nose by coughing or sneezing into the elbow areas of your shirt or coat, into a tissue or into your sleeve (not your hands). . Avoid shaking hands with others and consider head nods or verbal greetings only. . Avoid touching your eyes, nose, or mouth with unwashed hands.  . Avoid close contact with people who are sick. . Avoid places or events with large numbers of people in one location, like concerts or sporting events. . Carefully consider travel plans you have or are making. . If you are planning any travel outside or inside the [place], visit the CDC's Travelers' Health webpage for the latest health notices. . If you have some symptoms but not all symptoms, continue to monitor at home and seek medical attention if your symptoms worsen. . If you are having a medical emergency, call 911.   ADDITIONAL HEALTHCARE OPTIONS FOR PATIENTS  [facility] / e-Visit: [email or web address]         [facility]: [phone]  [facility]: [phone]                   [facility]: [phone]   

## 2019-05-22 NOTE — Progress Notes (Signed)
Radiation Oncology         [phone] ________________________________  Name: Brandi Raymond MRN: [id number]  Date: 05/22/2019  DOB: [birth year]  Follow-Up Visit Note  CC: Binnie Rail, MD  Binnie Rail, MD    ICD-10-CM   1. Stage I squamous cell carcinoma of right lung [facility])  C34.91     Diagnosis:   85 y.o. 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 She met with Dr. Julien Nordmann earlier this morning for follow up.  Since her last visit, she underwent repeat chest CT scan on 05/09/2019. This showed: further volume loss in the RLL; increased fissural thickening along the minor fissure in the right chest with some nodular features; stable 5 mm RUL pulmonary nodule; subtle ground-glass nodularity in the RUL and lingula; interval decrease in size of subcarinal and right hilar lymph node with stable mediastinal lymph nodes.  On review of systems, she reports occasional "dry hacking" cough and shortness of breath with exertion. She denies pain, hemoptysis, and difficulty swallowing.  She uses oxygen when sleeping at night and occasionally around the house.  She is off oxygen while in the clinic  ALLERGIES:  is allergic to silver sulfadiazine.  Meds: Current Outpatient Medications  Medication Sig Dispense Refill  . amLODipine (NORVASC) 5 MG tablet TAKE 1 TABLET BY MOUTH EVERY DAY 90 tablet 1  . aspirin 81 MG tablet Take 81 mg by mouth daily.      . bisacodyl (DULCOLAX) 5 MG EC tablet Take 15 mg by mouth at bedtime.     . Calcium-Magnesium-Vitamin D (CALCIUM 1200+D3 PO) Take 1 tablet by mouth daily.    . clonazePAM (KLONOPIN) 0.5 MG tablet TAKE 1 TABLET BY MOUTH EVERYDAY AT BEDTIME 30 tablet 0  . clopidogrel (PLAVIX) 75 MG tablet TAKE 1 TABLET BY MOUTH EVERY  DAY 90 tablet 3  . donepezil (ARICEPT) 10 MG tablet TAKE 1 TABLET BY MOUTH EVERYDAY AT BEDTIME 90 tablet 3  . DULoxetine (CYMBALTA) 60 MG capsule TAKE 1 CAPSULE BY MOUTH EVERY DAY 90 capsule 2  . famotidine (PEPCID) 20 MG tablet Take 1 tablet (20 mg total) by mouth at bedtime. 30 tablet 2  . gabapentin (NEURONTIN) 300 MG capsule TAKE ONE CAPSULE TWICE DAILY AND 2 AT NIGHT = FOUR TOTAL DAILY. 360 capsule 1  . levothyroxine (SYNTHROID) 125 MCG tablet TAKE 1 TABLET (125 MCG TOTAL) BY MOUTH DAILY BEFORE BREAKFAST. 90 tablet 0  . lisinopril (ZESTRIL) 5 MG tablet Take 1 tablet (5 mg total) by mouth daily. 90 tablet 3  . Multiple Vitamins-Minerals (PRESERVISION AREDS 2 PO) Take 1 capsule by mouth 2 (two) times daily.    . nicotine (NICODERM CQ) 21 mg/24hr patch Place 1 patch (21 mg total) onto the skin daily. 28 patch 3  . nicotine polacrilex (COMMIT) 4 MG lozenge Take 1 lozenge (4 mg total) by mouth as needed for smoking cessation. 100 tablet 2  . oxyCODONE (OXY IR/ROXICODONE) 5 MG immediate release tablet Take 1 tablet (5 mg total) by mouth every 4 (four) hours as needed for moderate pain. 30 tablet 0  . pravastatin (PRAVACHOL) 40 MG tablet TAKE 1 TABLET BY MOUTH EVERY DAY 90 tablet 2  . PROLIA 60 MG/ML SOSY injection Inject 1 each into the skin every  6 (six) months.    . umeclidinium-vilanterol (ANORO ELLIPTA) 62.5-25 MCG/INH AEPB Inhale 1 puff into the lungs daily. 2 each 0  . umeclidinium-vilanterol (ANORO ELLIPTA) 62.5-25 MCG/INH AEPB Inhale 1 puff into the lungs daily. 1 each 0   No current facility-administered medications for this encounter.     Physical Findings: The patient is in no acute distress. Patient is alert and oriented.  height is 5' (1.524 m) and weight is 126 lb 8 oz (57.4 kg). Her temporal temperature is 98.3 F (36.8 C). Her blood pressure is 135/65 and her pulse is 75. Her respiration is 18 and oxygen saturation is 93%.   Lungs are clear to auscultation bilaterally. Heart has  regular rate and rhythm. No palpable cervical, supraclavicular, or axillary adenopathy. Abdomen soft, non-tender, normal bowel sounds.    Lab Findings: Lab Results  Component Value Date   WBC 6.5 05/09/2019   HGB 11.7 (L) 05/09/2019   HCT 37.9 05/09/2019   MCV 90.7 05/09/2019   PLT 157 05/09/2019    Radiographic Findings: Ct Chest W Contrast  Addendum Date: 05/09/2019   ADDENDUM REPORT: 05/09/2019 17:17 ADDENDUM: Interval decrease in size of subcarinal and right hilar lymph node with stable mediastinal lymph nodes. Significance remains uncertain. Electronically Signed   By: Zetta Bills M.D.   On: 05/09/2019 17:17   Result Date: 05/09/2019 CLINICAL DATA:  Lung cancer, non-small cell with diagnosis in February 2020, history of radiation therapy. EXAM: CT CHEST WITH CONTRAST TECHNIQUE: Multidetector CT imaging of the chest was performed during intravenous contrast administration. CONTRAST:  60m OMNIPAQUE IOHEXOL 300 MG/ML  SOLN COMPARISON:  01/27/2019 FINDINGS: Cardiovascular: Marked calcific atherosclerotic changes throughout the thoracic aorta. Stable caliber. Noncalcified plaque also with irregularity in the thoracic aorta. Extensive coronary artery calcifications. Mitral annular and aortic valvular calcifications. No signs of pericardial effusion. Heart size stable, mildly enlarged. Central pulmonary vasculature is unremarkable. Mediastinum/Nodes: No signs of thoracic inlet lymphadenopathy. Subcarinal lymph node measuring 12 mm short axis (image 67, series 2) previously 15 mm. Small AP window and paratracheal lymph nodes are stable largest approximately 9 mm. No signs of hilar lymphadenopathy. Lungs/Pleura: Post radiation changes in the right chest with further volume loss and left signs of surrounding pneumonitis than on the prior exam. Signs of moderate to marked pulmonary emphysema at the lung apices. Increased fissural thickening along the minor fissure in the right chest. This measures  approximately 9 mm in greatest thickness (image 37, series 7, sagittal view and shows nodularity in terms of its borders distorting the fissure. (Image 39, series 5): Stable 5 x 4 mm pulmonary nodule in the right upper lobe. Subtle ground-glass nodularity in the upper lobe and lingula. Upper Abdomen: Moderate size hiatal hernia. No sign of acute process in the upper abdomen. Musculoskeletal: Redemonstration of near complete loss of height at the L1 vertebral level. Osteopenia. No destructive bone process or acute abnormality. IMPRESSION: 1. Further volume loss in the right lower lobe following radiation therapy. 2. Increased fissural thickening along the minor fissure in the right chest with some nodular features is suspicious for lymphangitic spread of tumor away from site of treated disease. Area of evolving post treatment change and inflammatory changes are included in the differential. Correlation with radiation treatment planning followed by short interval follow-up or PET evaluation may be helpful. 3. Stable 5 x 4 mm pulmonary nodule in the right upper lobe. Recommend attention on follow-up. 4. Subtle ground-glass nodularity in the upper lobe and lingula could represent an infectious  or inflammatory process. Recommend attention on follow-up. Aortic Atherosclerosis (ICD10-I70.0) and Emphysema (ICD10-J43.9). Electronically Signed: By: Zetta Bills M.D. 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     Clinically stable status post SBRT.  Recent chest CT scan showed no areas of concern.  Some radiation changes along the fissure, I doubt this is lymphatic spread as mentioned in the report.  Treated tumor no longer visible in light of radiation changes  Plan: The patient would like to continue follow-up with radiation oncology.  She will be scheduled for a repeat chest CT scan by Dr. Julien Nordmann in approximately 6 months.   We will see the patient soon after this CT scan hopefully on the same day as Dr. Julien Nordmann to coordinate care.  Daughter will let us know when her upcoming chest CT scan is scheduled.  ____________________________________  Blair Promise, PhD, MD  This document serves as a record of services personally performed by Gery Pray, MD. It was created on his behalf by Wilburn Mylar, a trained medical scribe. The creation of this record is based on the scribe's personal observations and the provider's statements to them. This document has been checked and approved by the attending provider.
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
## 2019-05-22 NOTE — Progress Notes (Signed)
Ms. Lindsley presents today for f/u with Dr. Sondra Come. Pt denies c/o pain. Pt reports occasional non-productive "dry hacking" cough. Pt denies hemoptysis. Pt denies difficulty swallowing. Pt reports SOB with exertion.   BP 135/65 (BP Location: Right Arm, Patient Position: Sitting)   Pulse 75   Temp 98.3 F (36.8 C) (Temporal)   Resp 18   Ht 5' (1.524 m)   Wt 126 lb 8 oz (57.4 kg)   SpO2 93%   BMI 24.71 kg/m   Wt Readings from Last 3 Encounters:  05/22/19 126 lb 8 oz (57.4 kg)  05/22/19 126 lb 9.6 oz (57.4 kg)  05/21/19 126 lb (57.2 kg)   Loma Sousa, RN BSN

## 2019-05-24 NOTE — Progress Notes (Addendum)
Patient ID: [name]                 DOB: 20-Aug-1933                      MRN: [id number]     Subjective:  Brandi Raymond is a 85 y.o. female referred by nurse practitioner, Wyn Quaker, for smoking cessation (05/21/2019) after prescribing patient nicotine patches and nicotine lozenges. PMH is significant for CAD, thoracic aneurysm, hx of carotid stenosis (s/p R endartectomy 2017), hypothyroidism, vitamin D deficiency, hyperlipidemia, hypertension, PVD, prediabetes, depression, COPD, stage I squamous cell carcinoma of right lung, IBS, GERD, diverticulosis, myasthenia gravis, neruopathy, and osteoporosis. Of note, bupropion is a tier 1 medication on her insurance, thus $5.00 for 30 day supply and $15 for 90 day supply. Chantix and NRT are not covered on her insurance.  Pt presents today for initial appt for smoking cessation with her daughter, Brandi Raymond (permission given). Patient and daughter have concerns since nicotine patches in the past have caused her to have nightmares. Patient thinks nightmares went away when she took patch off at night. Patient's daughter is concerned about using Chantix/bupropion and would like pt to try nicotine replacement therapy first.   Insurance coverage: Medicare ([employer] PPO)  Social history: current smoker (3-10 cigarettes daily) (60 pack year history)  Current smoking cessation agents: none - has nicotine patch at home Prior smoking cessation agents: nicotine patches (somewhat helpful) Non-pharmacologic methods: none  Age when started using tobacco on a daily basis 20's Brand: Marlboro menthol gold (using Marlboro menthol silver currently) Triggers: habit, aggravation (husband), after meals Motivation to quit: children/grandchildren/new great-grand child  Rates IMPORTANCE of quitting tobacco on 1-10 scale of 8 Rates CONFIDENCE of quitting tobacco on 1-10 scale of patient states 8 (daughter states 3)  Fagerstrom Score Question Scoring  Patient Score  How soon after waking do you smoke your first cigarette? <5 mins (3) 5-30 mins (2) 31-60 mins (1) >60 mins (0) 2  Do you find it difficult NOT to smoke in places where you shouldn't? Yes(1) No (0) 0  Which cigarette would you most hate to give up? First one in AM (1)  Any other one (0) 1     How many cigarettes do you smoke/day? 10 or less (0) 11-20 (1) 21-30 (2) >30 (3) 0  Do you smoke more during the first few hours after waking? Yes (1) No (0) 0 (after lunch and supper)  Do you smoke if you are so ill you cannot get out of bed? Yes (1) No (0) 0   Total Score 3  Score interpretation: low 1-2, low-to-moderate 3-4, moderate 5-7, high >7  Total score: 3  Objective: Current Outpatient Medications on File Prior to Visit  Medication Sig Dispense Refill  . amLODipine (NORVASC) 5 MG tablet TAKE 1 TABLET BY MOUTH EVERY DAY 90 tablet 1  . aspirin 81 MG tablet Take 81 mg by mouth daily.      . bisacodyl (DULCOLAX) 5 MG EC tablet Take 15 mg by mouth at bedtime.     . Calcium-Magnesium-Vitamin D (CALCIUM 1200+D3 PO) Take 1 tablet by mouth daily.    . clonazePAM (KLONOPIN) 0.5 MG tablet TAKE 1 TABLET BY MOUTH EVERYDAY AT BEDTIME 30 tablet 0  . clopidogrel (PLAVIX) 75 MG tablet TAKE 1 TABLET BY MOUTH EVERY DAY 90 tablet 3  . donepezil (ARICEPT) 10 MG tablet TAKE 1 TABLET BY MOUTH EVERYDAY AT BEDTIME  90 tablet 3  . DULoxetine (CYMBALTA) 60 MG capsule TAKE 1 CAPSULE BY MOUTH EVERY DAY 90 capsule 2  . famotidine (PEPCID) 20 MG tablet Take 1 tablet (20 mg total) by mouth at bedtime. 30 tablet 2  . gabapentin (NEURONTIN) 300 MG capsule TAKE ONE CAPSULE TWICE DAILY AND 2 AT NIGHT = FOUR TOTAL DAILY. 360 capsule 1  . levothyroxine (SYNTHROID) 125 MCG tablet TAKE 1 TABLET (125 MCG TOTAL) BY MOUTH DAILY BEFORE BREAKFAST. 90 tablet 0  . lisinopril (ZESTRIL) 5 MG tablet Take 1 tablet (5 mg total) by mouth daily. 90 tablet 3  . Multiple Vitamins-Minerals (PRESERVISION AREDS 2 PO) Take  1 capsule by mouth 2 (two) times daily.    . nicotine (NICODERM CQ) 21 mg/24hr patch Place 1 patch (21 mg total) onto the skin daily. 28 patch 3  . nicotine polacrilex (COMMIT) 4 MG lozenge Take 1 lozenge (4 mg total) by mouth as needed for smoking cessation. 100 tablet 2  . oxyCODONE (OXY IR/ROXICODONE) 5 MG immediate release tablet Take 1 tablet (5 mg total) by mouth every 4 (four) hours as needed for moderate pain. 30 tablet 0  . pravastatin (PRAVACHOL) 40 MG tablet TAKE 1 TABLET BY MOUTH EVERY DAY 90 tablet 2  . PROLIA 60 MG/ML SOSY injection Inject 1 each into the skin every 6 (six) months.    . umeclidinium-vilanterol (ANORO ELLIPTA) 62.5-25 MCG/INH AEPB Inhale 1 puff into the lungs daily. 2 each 0  . umeclidinium-vilanterol (ANORO ELLIPTA) 62.5-25 MCG/INH AEPB Inhale 1 puff into the lungs daily. 1 each 0   No current facility-administered medications on file prior to visit.     Assessment/Plan: 1. Smoking Cessation: -Nicotine patch: Initiated nicotine replacement tx with nicotine patch (14 mg) daily (patient smokes <10 cigarettes daily) . Patient counseled on purpose, proper use, and potential adverse effects, including    -Weeks 1 to 6: one nicotine patch (14 mg) daily. I will call and re-assess how you are doing at the end of 6 weeks to see how you are doing on 14 mg patch and if you are ready to decrease dose of patch. -Weeks 7-8: one nicotine patch (7 mg) daily Advised pt to f/u with [place] [facility] to help with cost. Patient stated she would like a prescription sent in to the pharmacy, although I informed her it did not appear to be covered by her insurance.  -Nicotine gum/lozenge: Start using nicotine lozenge (4 mg) and/or nicotine gum (4 mg) Lozenge dosing schedule Weeks 1 to 6: 1 lozenge every 1 to 2 hours (maximum: 5 lozenges every 6 hours; 20 lozenges/day); to increase chances of quitting, use at least 9 lozenges/day during the first 6 weeks  Weeks 7 to 9: 1 lozenge every 2 to 4  hours (maximum: 5 lozenges every 6 hours; 20 lozenges/day)  Weeks 10 to 12: 1 lozenge every 4 to 8 hours (maximum: 5 lozenges every 6 hours; 20 lozenges/day)  Gum dosing schedule Weeks 1 to 6: Chew 1 piece of gum every 1 to 2 hours (maximum: 24 pieces/day); to increase chances of quitting, chew at least 9 pieces/day during the first 6 weeks  Weeks 7 to 9: Chew 1 piece of gum every 2 to 4 hours (maximum: 24 pieces/day)  Weeks 10 to 12: Chew 1 piece of gum every 4 to 8 hours (maximum: 24 pieces/day) Advised pt to f/u with [place] [facility] to help with cost. Patient stated she would like a prescription sent in to the pharmacy, although I  informed her it did not appear to be covered by her insurance.  -Nonpharmacologic methods:  Patient agreed to try to use mint/hard candy after lunch, smoke a few puffs of a cigarette at a time, and/or buy a cigarette brand you do not like. Also, provided information on 1 800-QUIT NOW support program (handout provided).    -Follow up: 2 weeks (06/09/2019)  Thank you for involving pharmacy to assist in providing Ms.Baris's care.   Drexel Iha, PharmD PGY2 Ambulatory Care Pharmacy Resident

## 2019-05-24 NOTE — Patient Instructions (Addendum)
It was a pleasure seeing you in clinic today [name]!  Today the plan is... 1. Start using nicotine patch (14 mg) daily  -Start using nicotine patch (14 mg/day) for 6 weeks, followed by lower dose of nicotine patch (7 mg/day) for 2 weeks. I will call and re-assess how you are doing at the end of 6 weeks to see how you are doing on 14 mg patch and if you are ready to decrease dose of patch.  2. Start using nicotine lozenge (4 mg) and/or nicotine gum (4 mg) Lozenge dosing schedule Weeks 1 to 6: 1 lozenge every 1 to 2 hours (maximum: 5 lozenges every 6 hours; 20 lozenges/day); to increase chances of quitting, use at least 9 lozenges/day during the first 6 weeks  Weeks 7 to 9: 1 lozenge every 2 to 4 hours (maximum: 5 lozenges every 6 hours; 20 lozenges/day)  Weeks 10 to 12: 1 lozenge every 4 to 8 hours (maximum: 5 lozenges every 6 hours; 20 lozenges/day)  Gum dosing schedule Weeks 1 to 6: Chew 1 piece of gum every 1 to 2 hours (maximum: 24 pieces/day); to increase chances of quitting, chew at least 9 pieces/day during the first 6 weeks  Weeks 7 to 9: Chew 1 piece of gum every 2 to 4 hours (maximum: 24 pieces/day)  Weeks 10 to 12: Chew 1 piece of gum every 4 to 8 hours (maximum: 24 pieces/day)  3. Try calling [place] [facility] to get set up with counselor as well as to see if they can help with cost of patch/gum/lozenge  4. Remember, smoking is a habit! Try hard to use mint/hard candy after lunch, smoke a few puffs of a cigarette at a time, and/or buy a cigarette brand you do not like.   5. I will call you in two weeks to check in again 06/09/2019 Please call the PharmD clinic at [phone] if you have any questions that you would like to speak with a pharmacist about Brandi Raymond, Museum/gallery conservator).

## 2019-05-26 ENCOUNTER — Other Ambulatory Visit: Payer: Self-pay

## 2019-05-26 ENCOUNTER — Ambulatory Visit (INDEPENDENT_AMBULATORY_CARE_PROVIDER_SITE_OTHER): Payer: PPO | Admitting: Pharmacist

## 2019-05-26 DIAGNOSIS — K219 Gastro-esophageal reflux disease without esophagitis: Secondary | ICD-10-CM

## 2019-05-26 DIAGNOSIS — F17210 Nicotine dependence, cigarettes, uncomplicated: Secondary | ICD-10-CM

## 2019-05-26 DIAGNOSIS — F17200 Nicotine dependence, unspecified, uncomplicated: Secondary | ICD-10-CM

## 2019-05-26 MED ORDER — FAMOTIDINE 20 MG PO TABS: 20.0000 mg | ORAL_TABLET | Freq: Every day | ORAL | 2 refills | Status: DC

## 2019-05-26 MED ORDER — NICOTINE POLACRILEX 4 MG MT GUM: 4.0000 mg | CHEWING_GUM | OROMUCOSAL | 0 refills | Status: DC | PRN

## 2019-05-26 MED ORDER — NICOTINE 14 MG/24HR TD PT24: 14.0000 mg | MEDICATED_PATCH | Freq: Every day | TRANSDERMAL | 0 refills | Status: DC

## 2019-05-26 NOTE — Addendum Note (Signed)
Addended by: Ellwood Handler on: 05/26/2019 02:37 PM   Modules accepted: Orders

## 2019-05-30 ENCOUNTER — Other Ambulatory Visit: Payer: Self-pay | Admitting: Internal Medicine

## 2019-05-30 NOTE — Telephone Encounter (Signed)
[place] Controlled Database Checked Last filled: 05/01/19 # 30 LOV w/you: 01/02/19 Next appt w/you: 07/04/19

## 2019-06-05 ENCOUNTER — Other Ambulatory Visit: Payer: Self-pay

## 2019-06-05 DIAGNOSIS — I6523 Occlusion and stenosis of bilateral carotid arteries: Secondary | ICD-10-CM

## 2019-06-05 DIAGNOSIS — K551 Chronic vascular disorders of intestine: Secondary | ICD-10-CM

## 2019-06-06 ENCOUNTER — Telehealth (HOSPITAL_COMMUNITY): Payer: Self-pay | Admitting: *Deleted

## 2019-06-06 NOTE — Telephone Encounter (Signed)
The above patient or their representative was contacted and gave the following answers to these questions:         Do you have any of the following symptoms?    NO  Fever                    Cough                   Shortness of breath  Do  you have any of the following other symptoms?    muscle pain         vomiting,        diarrhea        rash         weakness        red eye        abdominal pain         bruising          bruising or bleeding              joint pain           severe headache    Have you been in contact with someone who was or has been sick in the past 2 weeks?  NO  Yes                 Unsure                         Unable to assess   Does the person that you were in contact with have any of the following symptoms? n  Cough         shortness of breath           muscle pain         vomiting,            diarrhea            rash            weakness           fever            red eye           abdominal pain           bruising  or  bleeding                joint pain                severe headache                 COMMENTS OR ACTION PLAN FOR THIS PATIENT:

## 2019-06-09 ENCOUNTER — Ambulatory Visit: Payer: PPO | Admitting: Family

## 2019-06-09 ENCOUNTER — Ambulatory Visit (HOSPITAL_COMMUNITY)
Admission: RE | Admit: 2019-06-09 | Discharge: 2019-06-09 | Disposition: A | Discharge status: Home / Self Care | Payer: PPO | Source: Ambulatory Visit | Attending: Family | Admitting: Family

## 2019-06-09 ENCOUNTER — Ambulatory Visit (INDEPENDENT_AMBULATORY_CARE_PROVIDER_SITE_OTHER)
Admission: RE | Admit: 2019-06-09 | Discharge: 2019-06-09 | Disposition: A | Discharge status: Home / Self Care | Payer: PPO | Source: Ambulatory Visit | Attending: Family | Admitting: Family

## 2019-06-09 ENCOUNTER — Telehealth: Payer: Self-pay | Admitting: Pharmacist

## 2019-06-09 ENCOUNTER — Encounter: Payer: Self-pay | Admitting: Family

## 2019-06-09 ENCOUNTER — Other Ambulatory Visit: Payer: Self-pay

## 2019-06-09 VITALS — BP 102/76 | HR 55 | Temp 97.2°F | Resp 16 | Ht 64.0 in | Wt 125.0 lb

## 2019-06-09 DIAGNOSIS — K551 Chronic vascular disorders of intestine: Secondary | ICD-10-CM

## 2019-06-09 DIAGNOSIS — Z9889 Other specified postprocedural states: Secondary | ICD-10-CM

## 2019-06-09 DIAGNOSIS — F17200 Nicotine dependence, unspecified, uncomplicated: Secondary | ICD-10-CM | POA: Diagnosis not present

## 2019-06-09 DIAGNOSIS — I6523 Occlusion and stenosis of bilateral carotid arteries: Secondary | ICD-10-CM | POA: Diagnosis not present

## 2019-06-09 NOTE — Patient Instructions (Signed)
Steps to Quit Smoking Smoking tobacco is the leading cause of preventable death. It can affect almost every organ in the body. Smoking puts you and people around you at risk for many serious, long-lasting (chronic) diseases. Quitting smoking can be hard, but it is one of the best things that you can do for your health. It is never too late to quit. How do I get ready to quit? When you decide to quit smoking, make a plan to help you succeed. Before you quit:  Pick a date to quit. Set a date within the next 2 weeks to give you time to prepare.  Write down the reasons why you are quitting. Keep this list in places where you will see it often.  Tell your family, friends, and co-workers that you are quitting. Their support is important.  Talk with your doctor about the choices that may help you quit.  Find out if your health insurance will pay for these treatments.  Know the people, places, things, and activities that make you want to smoke (triggers). Avoid them. What first steps can I take to quit smoking?  Throw away all cigarettes at home, at work, and in your car.  Throw away the things that you use when you smoke, such as ashtrays and lighters.  Clean your car. Make sure to empty the ashtray.  Clean your home, including curtains and carpets. What can I do to help me quit smoking? Talk with your doctor about taking medicines and seeing a counselor at the same time. You are more likely to succeed when you do both.  If you are pregnant or breastfeeding, talk with your doctor about counseling or other ways to quit smoking. Do not take medicine to help you quit smoking unless your doctor tells you to do so. To quit smoking: Quit right away  Quit smoking totally, instead of slowly cutting back on how much you smoke over a period of time.  Go to counseling. You are more likely to quit if you go to counseling sessions regularly. Take medicine You may take medicines to help you quit. Some  medicines need a prescription, and some you can buy over-the-counter. Some medicines may contain a drug called nicotine to replace the nicotine in cigarettes. Medicines may:  Help you to stop having the desire to smoke (cravings).  Help to stop the problems that come when you stop smoking (withdrawal symptoms). Your doctor may ask you to use:  Nicotine patches, gum, or lozenges.  Nicotine inhalers or sprays.  Non-nicotine medicine that is taken by mouth. Find resources Find resources and other ways to help you quit smoking and remain smoke-free after you quit. These resources are most helpful when you use them often. They include:  Online chats with a Social worker.  Phone quitlines.  Printed Furniture conservator/restorer.  Support groups or group counseling.  Text messaging programs.  Mobile phone apps. Use apps on your mobile phone or tablet that can help you stick to your quit plan. There are many free apps for mobile phones and tablets as well as websites. Examples include Quit Guide from the [employer] and smokefree.gov  What things can I do to make it easier to quit?   Talk to your family and friends. Ask them to support and encourage you.  Call a phone quitline (1-800-QUIT-NOW), reach out to support groups, or work with a Social worker.  Ask people who smoke to not smoke around you.  Avoid places that make you want to smoke,  such as: ? Bars. ? Parties. ? Smoke-break areas at work.  Spend time with people who do not smoke.  Lower the stress in your life. Stress can make you want to smoke. Try these things to help your stress: ? Getting regular exercise. ? Doing deep-breathing exercises. ? Doing yoga. ? Meditating. ? Doing a body scan. To do this, close your eyes, focus on one area of your body at a time from head to toe. Notice which parts of your body are tense. Try to relax the muscles in those areas. How will I feel when I quit smoking? Day 1 to 3 weeks Within the first 24 hours,  you may start to have some problems that come from quitting tobacco. These problems are very bad 2-3 days after you quit, but they do not often last for more than 2-3 weeks. You may get these symptoms:  Mood swings.  Feeling restless, nervous, angry, or annoyed.  Trouble concentrating.  Dizziness.  Strong desire for high-sugar foods and nicotine.  Weight gain.  Trouble pooping (constipation).  Feeling like you may vomit (nausea).  Coughing or a sore throat.  Changes in how the medicines that you take for other issues work in your body.  Depression.  Trouble sleeping (insomnia). Week 3 and afterward After the first 2-3 weeks of quitting, you may start to notice more positive results, such as:  Better sense of smell and taste.  Less coughing and sore throat.  Slower heart rate.  Lower blood pressure.  Clearer skin.  Better breathing.  Fewer sick days. Quitting smoking can be hard. Do not give up if you fail the first time. Some people need to try a few times before they succeed. Do your best to stick to your quit plan, and talk with your doctor if you have any questions or concerns. Summary  Smoking tobacco is the leading cause of preventable death. Quitting smoking can be hard, but it is one of the best things that you can do for your health.  When you decide to quit smoking, make a plan to help you succeed.  Quit smoking right away, not slowly over a period of time.  When you start quitting, seek help from your doctor, family, or friends. This information is not intended to replace advice given to you by your health care provider. Make sure you discuss any questions you have with your health care provider. Document Released: 04/29/2009 Document Revised: 09/20/2018 Document Reviewed: 09/21/2018 Elsevier Patient Education  2020 [employer].     Stroke Prevention Some medical conditions and lifestyle choices can lead to a higher risk for a stroke. You can  help to prevent a stroke by making nutrition, lifestyle, and other changes. What nutrition changes can be made?   Eat healthy foods. ? Choose foods that are high in fiber. These include:  Fresh fruits.  Fresh vegetables.  Whole grains. ? Eat at least 5 or more servings of fruits and vegetables each day. Try to fill half of your plate at each meal with fruits and vegetables. ? Choose lean protein foods. These include:  Lowfat (lean) cuts of meat.  Chicken without skin.  Fish.  Tofu.  Beans.  Nuts. ? Eat low-fat dairy products. ? Avoid foods that:  Are high in salt (sodium).  Have saturated fat.  Have trans fat.  Have cholesterol.  Are processed.  Are premade.  Follow eating guidelines as told by your doctor. These may include: ? Reducing how many calories you  eat and drink each day. ? Limiting how much salt you eat or drink each day to 1,500 milligrams (mg). ? Using only healthy fats for cooking. These include:  Olive oil.  Canola oil.  Sunflower oil. ? Counting how many carbohydrates you eat and drink each day. What lifestyle changes can be made?  Try to stay at a healthy weight. Talk to your doctor about what a good weight is for you.  Get at least 30 minutes of moderate physical activity at least 5 days a week. This can include: ? Fast walking. ? Biking. ? Swimming.  Do not use any products that have nicotine or tobacco. This includes cigarettes and e-cigarettes. If you need help quitting, ask your doctor. Avoid being around tobacco smoke in general.  Limit how much alcohol you drink to no more than 1 drink a day for nonpregnant women and 2 drinks a day for men. One drink equals 12 oz of beer, 5 oz of wine, or 1 oz of hard liquor.  Do not use drugs.  Avoid taking birth control pills. Talk to your doctor about the risks of taking birth control pills if: ? You are over 44 years old. ? You smoke. ? You get migraines. ? You have had a blood clot.  What other changes can be made?  Manage your cholesterol. ? It is important to eat a healthy diet. ? If your cholesterol cannot be managed through your diet, you may also need to take medicines. Take medicines as told by your doctor.  Manage your diabetes. ? It is important to eat a healthy diet and to exercise regularly. ? If your blood sugar cannot be managed through diet and exercise, you may need to take medicines. Take medicines as told by your doctor.  Control your high blood pressure (hypertension). ? Try to keep your blood pressure below 130/80. This can help lower your risk of stroke. ? It is important to eat a healthy diet and to exercise regularly. ? If your blood pressure cannot be managed through diet and exercise, you may need to take medicines. Take medicines as told by your doctor. ? Ask your doctor if you should check your blood pressure at home. ? Have your blood pressure checked every year. Do this even if your blood pressure is normal.  Talk to your doctor about getting checked for a sleep disorder. Signs of this can include: ? Snoring a lot. ? Feeling very tired.  Take over-the-counter and prescription medicines only as told by your doctor. These may include aspirin or blood thinners (antiplatelets or anticoagulants).  Make sure that any other medical conditions you have are managed. Where to find more information  American Stroke Association: www.strokeassociation.org  National Stroke Association: www.stroke.org Get help right away if:  You have any symptoms of stroke. "BE FAST" is an easy way to remember the main warning signs: ? B - Balance. Signs are dizziness, sudden trouble walking, or loss of balance. ? E - Eyes. Signs are trouble seeing or a sudden change in how you see. ? F - Face. Signs are sudden weakness or loss of feeling of the face, or the face or eyelid drooping on one side. ? A - Arms. Signs are weakness or loss of feeling in an arm. This  happens suddenly and usually on one side of the body. ? S - Speech. Signs are sudden trouble speaking, slurred speech, or trouble understanding what people say. ? T - Time. Time to call emergency  services. Write down what time symptoms started.  You have other signs of stroke, such as: ? A sudden, very bad headache with no known cause. ? Feeling sick to your stomach (nausea). ? Throwing up (vomiting). ? Jerky movements you cannot control (seizure). These symptoms may represent a serious problem that is an emergency. Do not wait to see if the symptoms will go away. Get medical help right away. Call your local emergency services (911 in the [place] Do not drive yourself to the hospital. Summary  You can prevent a stroke by eating healthy, exercising, not smoking, drinking less alcohol, and treating other health problems, such as diabetes, high blood pressure, or high cholesterol.  Do not use any products that contain nicotine or tobacco, such as cigarettes and e-cigarettes.  Get help right away if you have any signs or symptoms of a stroke. This information is not intended to replace advice given to you by your health care provider. Make sure you discuss any questions you have with your health care provider. Document Released: 01/02/2012 Document Revised: 08/29/2018 Document Reviewed: 10/04/2016 Elsevier Patient Education  2020 [employer] [place].     Chronic Mesenteric Ischemia  Chronic mesenteric ischemia is poor blood flow (circulation) in the vessels that supply blood to the stomach, intestines, and liver (mesenteric organs). When the blood supply is severely restricted, these organs cannot work properly. This condition is also called mesenteric angina, or intestinal angina. This condition is a long-term (chronic) condition. It happens when an artery or vein that provides blood to the mesenteric organs gradually becomes blocked or narrows over time, restricting the blood supply to these  organs. What are the causes? This condition is commonly caused by fatty deposits that build up in an artery (plaque), which can narrow the artery and restrict blood flow. Other causes include:  Weakened areas in blood vessel walls (aneurysms).  Conditions that cause twisting or inflammation of blood vessels, such as fibromuscular dysplasia or arteritis.  A disorder in which blood clots form in the veins (venous thrombosis).  Scarring and thickening (fibrosis) of blood vessels caused by radiation therapy.  A tear in the aorta, the body's main artery (aortic dissection).  Blood vessel problems after illegal drug use, such as use of cocaine.  Tumors in the nervous system (neurofibromatosis).  Certain autoimmune diseases, such as lupus. What increases the risk? The following factors may make you more likely to develop this condition:  Being female.  Being over age 34, especially if you have a history of heart problems.  Smoking.  Having congestive heart failure.  Having an irregular heartbeat (arrhythmia).  Having a history of heart attack or stroke.  Having diabetes.  Having high cholesterol.  Having high blood pressure (hypertension).  Being overweight or obese.  Having kidney disease (renal disease) that requires dialysis. What are the signs or symptoms? Symptoms of this condition include:  Pain or cramps in the abdomen that develop 15-60 minutes after a meal. This pain may last for 1-3 hours. Some people may develop a fear of eating because of this symptom.  Weight loss.  Diarrhea.  Bloody stool.  Nausea.  Vomiting.  Bloating.  Abdominal pain after stress or with exercise. How is this diagnosed? This condition is diagnosed based on:  Your medical history.  A physical exam.  Tests, such as: ? Ultrasound. ? CT scan. ? Blood tests. ? Urine tests. ? An imaging test that involves injecting a dye into your arteries to show blood flow through blood  vessels (angiogram). This can help to show if there are any blockages in the vessels that lead to the intestines. ? Passing a small probe through the mouth and into the stomach to measure the output of carbon dioxide (gastric tonometry). This can help to indicate whether there is decreased blood flow to the stomach and intestines. How is this treated? This condition may be treated with:  Dietary changes such as eating smaller, low-fat, meals more frequently.  Lifestyle changes to treat underlying conditions that contribute to the disease, such as high cholesterol and high blood pressure.  Medicines to reduce blood clotting and increase blood flow.  Surgery to remove the blockage, repair arteries or veins, and restore blood flow. This may involve: ? Angioplasty. This is surgery to widen the affected artery, reduce the blockage, and sometimes insert a small, mesh tube (stent). ? Bypass surgery. This may be done to go around (bypass) the blockage and reconnect healthy arteries or veins. ? Placing a stent in the affected area. This may be done to help keep blocked arteries open. Follow these instructions at home: Eating and drinking   Eat a heart-healthy diet. This includes fresh fruits and vegetables, whole grains, and lean proteins like chicken, fish, and beans.  Avoid foods that contain a lot of: ? Salt (sodium). ? Sugar. ? Saturated fat (such as red meat). ? Trans fat (such as in fried foods).  Stay hydrated. Drink enough fluid to keep your urine pale yellow. Lifestyle  Stay active and get regular exercise as told by your health care provider. Aim for 150 minutes of moderate activity or 75 minutes of vigorous activity a week. Ask your health care provider what activities and forms of exercise are safe for you.  Maintain a healthy weight.  Work with your health care provider to manage your cholesterol.  Manage any other health problems you have, such as high blood pressure,  diabetes, or heart rhythm problems.  Do not use any products that contain nicotine or tobacco, such as cigarettes, e-cigarettes, and chewing tobacco. If you need help quitting, ask your health care provider. General instructions  Take over-the-counter and prescription medicines only as told by your health care provider.  Keep all follow-up visits as told by your health care provider. This is important.  You may need to take actions to prevent or treat constipation, such as: ? Drink enough fluid to keep your urine pale yellow. ? Take over-the-counter or prescription medicines. ? Eat foods that are high in fiber, such as beans, whole grains, and fresh fruits and vegetables. ? Limit foods that are high in fat and processed sugars, such as fried or sweet foods. Contact a health care provider if:  Your symptoms do not improve or they return after treatment.  You have a fever.  You are constipated. Get help right away if you:  Have severe abdominal pain.  Have severe chest pain.  Have shortness of breath.  Feel weak or dizzy.  Have fast or irregular heartbeats (palpitations).  Have numbness or weakness in your face, arm, or leg.  Are confused.  Have trouble speaking or people have trouble understanding what you are saying.  Have trouble urinating.  Have blood in your stool.  Have severe nausea, vomiting, or persistent diarrhea. These symptoms may represent a serious problem that is an emergency. Do not wait to see if the symptoms will go away. Get medical help right away. Call your local emergency services (911 in the [place] Do not drive  yourself to the hospital. Summary  Mesenteric ischemia is poor circulation in the vessels that supply blood to the the stomach, intestines, and liver (mesenteric organs).  This condition happens when an artery or vein that provides blood to the mesenteric organs gradually becomes blocked or narrow, restricting the blood supply to the  organs.  This condition is commonly caused by fatty deposits that build up in an artery (plaque), which can narrow the artery and restrict blood flow.  You are more likely to develop this condition if you are over age 39 and have a history of heart problems, high blood pressure, diabetes, or high cholesterol.  This condition is usually treated with medicines, dietary and lifestyle changes, and surgery to remove the blockage, repair arteries or veins, and restore blood flow. This information is not intended to replace advice given to you by your health care provider. Make sure you discuss any questions you have with your health care provider. Document Released: 02/20/2011 Document Revised: 03/08/2018 Document Reviewed: 03/08/2018 Elsevier Patient Education  2020 [employer].
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
## 2019-06-09 NOTE — Telephone Encounter (Signed)
Called pt on 06/09/2019 at 11:56AM and left HIPAA-compliant VM with instructions to call [facility] back.  Plan to follow up regarding smoking cessation  Drexel Iha, PharmD PGY2 Ambulatory Care Pharmacy Resident

## 2019-06-11 DIAGNOSIS — J441 Chronic obstructive pulmonary disease with (acute) exacerbation: Secondary | ICD-10-CM | POA: Diagnosis not present

## 2019-06-18 ENCOUNTER — Ambulatory Visit: Payer: PPO | Admitting: Pulmonary Disease

## 2019-06-23 ENCOUNTER — Telehealth: Payer: Self-pay | Admitting: Pharmacist

## 2019-06-23 NOTE — Telephone Encounter (Signed)
Called pt on 06/23/2019 at 2:30PM.   Patient is currently smoking 2 cigarettes per day. She is not routinely using the patch/lozenge. She is unable to talk at this time and would prefer if I called back at a later date.  Plan to call pt on 06/30/2019 anytime other than 2-3PM

## 2019-06-25 ENCOUNTER — Encounter: Payer: Self-pay | Admitting: *Deleted

## 2019-06-30 ENCOUNTER — Telehealth: Payer: Self-pay | Admitting: Pharmacist

## 2019-06-30 NOTE — Telephone Encounter (Signed)
Called pt on 06/30/2019 at 4:27PM and unable to leave VM.  Will re-attempt to call patient in within the next 1-2 weeks  Drexel Iha, PharmD PGY2 Ambulatory Care Pharmacy Resident

## 2019-07-01 ENCOUNTER — Other Ambulatory Visit: Payer: Self-pay | Admitting: Internal Medicine

## 2019-07-02 ENCOUNTER — Other Ambulatory Visit: Payer: Self-pay | Admitting: Internal Medicine

## 2019-07-02 NOTE — Telephone Encounter (Signed)
[place] Controlled Database Checked Last filled: 06/01/19 # 30 LOV w/you: 01/02/19 Next appt w/you: 07/04/19

## 2019-07-03 ENCOUNTER — Other Ambulatory Visit: Payer: Self-pay | Admitting: Neurology

## 2019-07-03 ENCOUNTER — Encounter: Payer: Self-pay | Admitting: Internal Medicine

## 2019-07-03 NOTE — Patient Instructions (Addendum)
   Medications reviewed and updated.  Changes include :  pepcid twice daily  Your prescription(s) have been submitted to your pharmacy. Please take as directed and contact our office if you believe you are having problem(s) with the medication(s).     Please followup in 6 months

## 2019-07-03 NOTE — Progress Notes (Signed)
Subjective:    Patient ID: Brandi Raymond, female    DOB: Dec 03, 1933, 85 y.o.   MRN: [id number]  HPI The patient is here for follow up.  She is here with her daughter.   CAD, Hypertension: She is taking her medication daily. She is compliant with a low sodium diet.  She denies chest pain, palpitations, edemaand regular headaches.     Osteoporosis, h/o L1, 3 fx: she is taking calcium and vitamin d daily.  She is getting prolia every 6 months.    COPD on oxygen:  She has chronic DOE, cough.  She denies wheeze.  She is still smoking - she has tried to quit unsuccessfully, but will keep trying.  She takes anoro daily.    Lung cancer s/p radiation:  She completed radiation.  She has follow up with oncology next year.   Hypothyroidism:  She is taking her medication daily.  She denies any recent changes in energy or weight that are unexplained.   Prediabetes:  She is compliant with a low sugar/carbohydrate diet.  She is not exercising regularly.  Hyperlipidemia: She is taking her medication daily. She is compliant with a low fat/cholesterol diet. She denies myalgias.   Sleep d/o:  She takes clonazepam nightly.  She sleeps well with the medication.    Depression: She is taking her medication daily as prescribed. She denies any side effects from the medication. She feels her depression is controlled and she is happy with her current dose of medication.    Medications and allergies reviewed with patient and updated if appropriate.  Patient Active Problem List   Diagnosis Date Noted  . GERD (gastroesophageal reflux disease) 05/21/2019  . Chronic respiratory failure with hypoxia ([place]) 05/21/2019  . Medication management 05/21/2019  . Stage I squamous cell carcinoma of right lung ([place]) 02/06/2019  . Constipation 09/11/2018  . COPD with acute exacerbation ([place]) 09/04/2018  . Right lower lobe lung mass 09/04/2018  . Closed compression fracture of L1 lumbar vertebra, initial encounter  ([place]) 09/04/2018  . Preoperative clearance 07/19/2018  . Lower back pain 06/25/2018  . Cubital tunnel syndrome on left 05/22/2018  . Dupuytren's contracture of both hands 05/10/2018  . Primary osteoarthritis of both knees 05/10/2018  . Difficulty urinating 05/10/2018  . Osteoporosis 06/14/2017  . Prediabetes 06/23/2015  . Depression 06/23/2015  . Carotid stenosis 10/12/2014  . Sleep disorder 04/09/2014  . Mesenteric artery stenosis ([place]) 01/13/2013  . Thoracic aneurysm without mention of rupture 11/04/2012  . Chronic mesenteric ischemia ([place]) 10/08/2012  . Memory deficit 06/20/2012  . Irritable bowel syndrome 06/20/2012  . Ocular myasthenia gravis ([place]) 06/20/2012  . PVD (peripheral vascular disease) ([place]) 06/20/2012  . Hereditary and idiopathic peripheral neuropathy 05/22/2012  . Syncope 08/28/2011  . ABDOMINAL BRUIT 08/17/2009  . Coronary atherosclerosis 09/05/2008  . Hypothyroidism 05/26/2008  . VITAMIN D DEFICIENCY 05/26/2008  . CHOLELITHIASIS 12/06/2007  . DIVERTICULOSIS, COLON 12/05/2007  . HYPERLIPIDEMIA 08/19/2007  . COPD (chronic obstructive pulmonary disease) with emphysema ([place]) 08/19/2007  . CIGARETTE SMOKER 02/06/2007  . Essential hypertension 02/06/2007  . COLONIC POLYPS 11/21/2004    Current Outpatient Medications on File Prior to Visit  Medication Sig Dispense Refill  . amLODipine (NORVASC) 5 MG tablet TAKE 1 TABLET BY MOUTH EVERY DAY 90 tablet 1  . aspirin 81 MG tablet Take 81 mg by mouth daily.      . bisacodyl (DULCOLAX) 5 MG EC tablet Take 15 mg by mouth at bedtime.     Marland Kitchen  Calcium-Magnesium-Vitamin D (CALCIUM 1200+D3 PO) Take 1 tablet by mouth daily.    . clonazePAM (KLONOPIN) 0.5 MG tablet TAKE 1 TABLET BY MOUTH EVERYDAY AT BEDTIME 30 tablet 0  . clopidogrel (PLAVIX) 75 MG tablet TAKE 1 TABLET BY MOUTH EVERY DAY 90 tablet 3  . donepezil (ARICEPT) 10 MG tablet TAKE 1 TABLET BY MOUTH EVERYDAY AT BEDTIME 90 tablet 3  . DULoxetine (CYMBALTA) 60 MG capsule  TAKE 1 CAPSULE BY MOUTH EVERY DAY 90 capsule 2  . famotidine (PEPCID) 20 MG tablet Take 1 tablet (20 mg total) by mouth at bedtime. 30 tablet 2  . gabapentin (NEURONTIN) 300 MG capsule TAKE ONE CAPSULE TWICE DAILY AND 2 AT NIGHT = FOUR TOTAL DAILY. 360 capsule 1  . lisinopril (ZESTRIL) 5 MG tablet Take 1 tablet (5 mg total) by mouth daily. 90 tablet 3  . Multiple Vitamins-Minerals (PRESERVISION AREDS 2 PO) Take 1 capsule by mouth 2 (two) times daily.    . nicotine (NICODERM CQ - DOSED IN MG/24 HOURS) 14 mg/24hr patch Place 1 patch (14 mg total) onto the skin daily. 28 patch 0  . nicotine polacrilex (COMMIT) 4 MG lozenge Take 1 lozenge (4 mg total) by mouth as needed for smoking cessation. 100 tablet 2  . nicotine polacrilex (NICORETTE) 4 MG gum Take 1 each (4 mg total) by mouth as needed for smoking cessation. 100 tablet 0  . pravastatin (PRAVACHOL) 40 MG tablet TAKE 1 TABLET BY MOUTH EVERY DAY 90 tablet 2  . PROLIA 60 MG/ML SOSY injection Inject 1 each into the skin every 6 (six) months.    . umeclidinium-vilanterol (ANORO ELLIPTA) 62.5-25 MCG/INH AEPB Inhale 1 puff into the lungs daily. 1 each 0   No current facility-administered medications on file prior to visit.    Past Medical History:  Diagnosis Date  . Acute MI inferior subsequent episode care [facility]) 1993   PTCA RCA  . Adenomatous colon polyp   . Arthritis   . CAD (coronary artery disease)    Dr Percival Spanish  . Carotid artery occlusion   . Cervical spine fracture ([place])   . COPD (chronic obstructive pulmonary disease) ([place])   . Depression   . Diverticulosis   . Dyslipidemia   . Gastritis 09/15/1991  . GERD (gastroesophageal reflux disease) 09/15/1991   Dr Sharlett Iles  . Hiatal hernia 09/15/1991  . Hip fracture, right ([place])   . HLD (hyperlipidemia)   . HTN (hypertension)   . Hyperplastic polyps of stomach 11/2007   colonoscopy  . Hypertension   . Hypothyroidism    affecting the left eye, proptosis  . Iron deficiency anemia   .  Macular degeneration of left eye   . Memory loss   . Mesenteric artery stenosis ([place])   . Myasthenia gravis ([place])    With ocular features  . Myocardial infarction ([place]) 1993  . Ocular myasthenia gravis ([place])    Dr Jannifer Franklin  . Orthostatic hypotension 06/05/2013  . Pneumonia    hx  . PONV (postoperative nausea and vomiting)   . Shortness of breath dyspnea    occ  . Strabismus    left eye  . Syncope 1998    Past Surgical History:  Procedure Laterality Date  . ABDOMINAL AORTAGRAM N/A 10/15/2012   Procedure: ABDOMINAL Maxcine Ham;  Surgeon: Serafina Mitchell, MD;  Location: [facility] CATH LAB;  Service: Cardiovascular;  Laterality: N/A;  . arm surgery Left    fx  . BALLOON ANGIOPLASTY, ARTERY  1993  . CARDIAC CATHETERIZATION  1996   LAD 20/50, CFX OK, RCA 30 at prev PTCA site, EF with mild HK inferior wall  . CAROTID ANGIOGRAM N/A 10/28/2014   Procedure: CAROTID ANGIOGRAM;  Surgeon: Serafina Mitchell, MD;  Location: [facility] CATH LAB;  Service: Cardiovascular;  Laterality: N/A;  . CAROTID ENDARTERECTOMY    . CATARACT EXTRACTION     bilateral  . COLONOSCOPY W/ POLYPECTOMY  2006   Adenomatous polyps  . ENDARTERECTOMY Left 01/14/2015   Procedure: LEFT CAROTID ENDARTERECTOMY ;  Surgeon: Serafina Mitchell, MD;  Location: [place];  Service: Vascular;  Laterality: Left;  . ENDARTERECTOMY Right 08/12/2015   Procedure: ENDARTERECTOMY CAROTID WITH PATCH ANGIOPLASTY;  Surgeon: Serafina Mitchell, MD;  Location: [place];  Service: Vascular;  Laterality: Right;  . EYE MUSCLE SURGERY Left 11/04/2015  . EYE SURGERY Bilateral May 2016   Eyelids  . FOOT SURGERY Left   . LEG SURGERY Left    laceration  . MIDDLE EAR SURGERY Left 1970  . PERCUTANEOUS STENT INTERVENTION  12/03/2012   Procedure: PERCUTANEOUS STENT INTERVENTION;  Surgeon: Serafina Mitchell, MD;  Location: [facility] CATH LAB;  Service: Cardiovascular;;  sma stent x1  . STRABISMUS SURGERY Left 10/28/2015   Procedure: REPAIR STRABISMUS LEFT EYE;  Surgeon: Lamonte Sakai, MD;   Location: [place];  Service: Ophthalmology;  Laterality: Left;  . Third-degree Ree Alcalde  2003   [facility]-legs ,buttocks,arms  . TOTAL ABDOMINAL HYSTERECTOMY  1973   Dysfunctional menses  . UPPER GI ENDOSCOPY      Dr Sharlett Iles  . VISCERAL ANGIOGRAM N/A 10/15/2012   Procedure: VISCERAL ANGIOGRAM;  Surgeon: Serafina Mitchell, MD;  Location: [facility] CATH LAB;  Service: Cardiovascular;  Laterality: N/A;  . VISCERAL ANGIOGRAM N/A 12/03/2012   Procedure: VISCERAL ANGIOGRAM;  Surgeon: Serafina Mitchell, MD;  Location: [facility] CATH LAB;  Service: Cardiovascular;  Laterality: N/A;  . VISCERAL ANGIOGRAM N/A 08/05/2013   Procedure: MESENTERIC ANGIOGRAM;  Surgeon: Serafina Mitchell, MD;  Location: [facility] CATH LAB;  Service: Cardiovascular;  Laterality: N/A;  . VISCERAL ANGIOGRAM N/A 10/28/2014   Procedure: VISCERAL ANGIOGRAM;  Surgeon: Serafina Mitchell, MD;  Location: [facility] CATH LAB;  Service: Cardiovascular;  Laterality: N/A;    Social History   Socioeconomic History  . Marital status: Married    Spouse name: Not on file  . Number of children: 3  . Years of education: 9th  . Highest education level: Not on file  Occupational History  . Occupation: Retired  Tobacco Use  . Smoking status: Current Some Day Smoker    Packs/day: 1.00    Years: 60.00    Pack years: 60.00    Types: Cigarettes  . Smokeless tobacco: Never Used  . Tobacco comment: 3-10 cigarettes a day 05/21/2019  Substance and Sexual Activity  . Alcohol use: No    Alcohol/week: 0.0 standard drinks  . Drug use: No  . Sexual activity: Never  Other Topics Concern  . Not on file  Social History Narrative   Patient is right handed.   Patient drinks 3-4 cups of caffeine daily.   Social Determinants of Health   Financial Resource Strain:   . Difficulty of Paying Living Expenses: Not on file  Food Insecurity:   . Worried About Charity fundraiser in the Last Year: Not on file  . Ran Out of Food in the Last Year: Not on file  Transportation Needs:   .  Lack of Transportation (Medical): Not on file  . Lack of Transportation (  Non-Medical): Not on file  Physical Activity:   . Days of Exercise per Week: Not on file  . Minutes of Exercise per Session: Not on file  Stress:   . Feeling of Stress : Not on file  Social Connections:   . Frequency of Communication with Friends and Family: Not on file  . Frequency of Social Gatherings with Friends and Family: Not on file  . Attends Religious Services: Not on file  . Active Member of Clubs or Organizations: Not on file  . Attends Archivist Meetings: Not on file  . Marital Status: Not on file    Family History  Problem Relation Age of Onset  . Throat cancer Mother        ? thyroid cancer  . Cancer Mother   . Emphysema Father   . Diabetes Father   . Heart attack Father 56  . Colon cancer Brother   . Cerebral aneurysm Brother   . Hypothyroidism Sister        X68  . Cancer Brother        Ear  . Diabetes Paternal Grandmother   . Diabetes Paternal Grandfather   . Diabetes Maternal Aunt     Review of Systems  Constitutional: Negative for chills and fever.  Respiratory: Positive for cough (rare) and shortness of breath. Negative for wheezing.   Cardiovascular: Negative for chest pain, palpitations and leg swelling.  Gastrointestinal: Positive for abdominal pain (worse with eating).  Neurological: Positive for headaches (occ). Negative for light-headedness.       Objective:   Vitals:   07/04/19 1109  BP: (!) 148/72  Pulse: (!) 46  Temp: 98.8 F (37.1 C)  SpO2: (!) 87%   BP Readings from Last 3 Encounters:  07/04/19 (!) 148/72  06/09/19 102/76  05/22/19 135/65   Wt Readings from Last 3 Encounters:  07/04/19 129 lb (58.5 kg)  06/09/19 125 lb (56.7 kg)  05/22/19 126 lb 8 oz (57.4 kg)   Body mass index is 22.14 kg/m.   Physical Exam    Constitutional: Appears well-developed and well-nourished. No distress.  HENT:  Head: Normocephalic and atraumatic.  Neck:  Neck supple. No tracheal deviation present. No thyromegaly present.  No cervical lymphadenopathy Cardiovascular: Normal rate, regular rhythm and normal heart sounds.   No murmur heard. No carotid bruit .  No edema Pulmonary/Chest: Effort normal. No respiratory distress. No has no wheezes. Mild bibasilar dry rales.  Skin: Skin is warm and dry. Not diaphoretic.  Psychiatric: Normal mood and affect. Behavior is normal.      Assessment & Plan:    See Problem List for Assessment and Plan of chronic medical problems.     This visit occurred during the SARS-CoV-2 public health emergency.  Safety protocols were in place, including screening questions prior to the visit, additional usage of staff PPE, and extensive cleaning of exam room while observing appropriate contact time as indicated for disinfecting solutions.

## 2019-07-04 ENCOUNTER — Other Ambulatory Visit: Payer: Self-pay | Admitting: Internal Medicine

## 2019-07-04 ENCOUNTER — Encounter: Payer: Self-pay | Admitting: Internal Medicine

## 2019-07-04 ENCOUNTER — Ambulatory Visit (INDEPENDENT_AMBULATORY_CARE_PROVIDER_SITE_OTHER): Payer: PPO | Admitting: Internal Medicine

## 2019-07-04 ENCOUNTER — Other Ambulatory Visit: Payer: Self-pay

## 2019-07-04 VITALS — BP 148/72 | HR 46 | Temp 98.8°F | Ht 64.0 in | Wt 129.0 lb

## 2019-07-04 DIAGNOSIS — K219 Gastro-esophageal reflux disease without esophagitis: Secondary | ICD-10-CM | POA: Diagnosis not present

## 2019-07-04 DIAGNOSIS — G479 Sleep disorder, unspecified: Secondary | ICD-10-CM

## 2019-07-04 DIAGNOSIS — I10 Essential (primary) hypertension: Secondary | ICD-10-CM

## 2019-07-04 DIAGNOSIS — K551 Chronic vascular disorders of intestine: Secondary | ICD-10-CM

## 2019-07-04 DIAGNOSIS — E039 Hypothyroidism, unspecified: Secondary | ICD-10-CM

## 2019-07-04 DIAGNOSIS — F32A Depression, unspecified: Secondary | ICD-10-CM

## 2019-07-04 DIAGNOSIS — M810 Age-related osteoporosis without current pathological fracture: Secondary | ICD-10-CM | POA: Diagnosis not present

## 2019-07-04 DIAGNOSIS — F329 Major depressive disorder, single episode, unspecified: Secondary | ICD-10-CM | POA: Diagnosis not present

## 2019-07-04 DIAGNOSIS — R7303 Prediabetes: Secondary | ICD-10-CM | POA: Diagnosis not present

## 2019-07-04 DIAGNOSIS — E782 Mixed hyperlipidemia: Secondary | ICD-10-CM

## 2019-07-04 MED ORDER — FAMOTIDINE 20 MG PO TABS: 20.0000 mg | ORAL_TABLET | Freq: Two times a day (BID) | ORAL | 1 refills | Status: DC

## 2019-07-04 NOTE — Assessment & Plan Note (Signed)
Controlled, stable Continue current dose of medication  - clonazepam nightly

## 2019-07-04 NOTE — Assessment & Plan Note (Signed)
She has been stable on her current dose Will defer blood work today - will need it at her next ivsit Continue current dose

## 2019-07-04 NOTE — Assessment & Plan Note (Signed)
On prolia Q 6 months - due April 2021 Continue calcium and vitamin d Will work on smoking cessation

## 2019-07-04 NOTE — Assessment & Plan Note (Signed)
Will defer blood work since a1c has been stable.  Continue low sugar/carb diet

## 2019-07-04 NOTE — Assessment & Plan Note (Signed)
bp ok for her Continue current medications at current doses

## 2019-07-04 NOTE — Assessment & Plan Note (Signed)
Not ideally controlled Increase pepcid to 20 mg BID

## 2019-07-04 NOTE — Assessment & Plan Note (Signed)
Controlled, stable Continue current dose of medication  

## 2019-07-04 NOTE — Assessment & Plan Note (Signed)
Continue statin. 

## 2019-07-04 NOTE — Assessment & Plan Note (Signed)
Having increased abdominal pain - worse with eating Has follow u with Dr Trula Slade - recent imaging showed probable stenosis of the stent

## 2019-07-07 ENCOUNTER — Telehealth: Payer: Self-pay | Admitting: Pharmacist

## 2019-07-07 ENCOUNTER — Ambulatory Visit (INDEPENDENT_AMBULATORY_CARE_PROVIDER_SITE_OTHER): Payer: PPO | Admitting: Surgery

## 2019-07-07 ENCOUNTER — Other Ambulatory Visit: Payer: Self-pay | Admitting: *Deleted

## 2019-07-07 ENCOUNTER — Encounter: Payer: Self-pay | Admitting: Surgery

## 2019-07-07 ENCOUNTER — Other Ambulatory Visit: Payer: Self-pay

## 2019-07-07 ENCOUNTER — Encounter: Payer: Self-pay | Admitting: *Deleted

## 2019-07-07 VITALS — BP 168/68 | HR 56 | Temp 97.9°F | Resp 20 | Ht 64.0 in | Wt 129.0 lb

## 2019-07-07 DIAGNOSIS — K551 Chronic vascular disorders of intestine: Secondary | ICD-10-CM

## 2019-07-07 DIAGNOSIS — I6523 Occlusion and stenosis of bilateral carotid arteries: Secondary | ICD-10-CM | POA: Diagnosis not present

## 2019-07-07 NOTE — Telephone Encounter (Signed)
Called patient on 07/07/2019 at 11:10 AM   Spoke with patient's husband. Patient is currently napping. Will call back at at later time.   Drexel Iha, PharmD PGY2 Ambulatory Care Pharmacy Resident
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
## 2019-07-11 DIAGNOSIS — J441 Chronic obstructive pulmonary disease with (acute) exacerbation: Secondary | ICD-10-CM | POA: Diagnosis not present

## 2019-07-15 ENCOUNTER — Other Ambulatory Visit: Payer: Self-pay | Admitting: Internal Medicine

## 2019-07-15 ENCOUNTER — Encounter: Payer: Self-pay | Admitting: Internal Medicine

## 2019-07-15 DIAGNOSIS — K2100 Gastro-esophageal reflux disease with esophagitis, without bleeding: Secondary | ICD-10-CM

## 2019-07-15 MED ORDER — PANTOPRAZOLE SODIUM 40 MG PO TBEC: 40.0000 mg | DELAYED_RELEASE_TABLET | Freq: Every day | ORAL | 1 refills | Status: DC

## 2019-07-19 ENCOUNTER — Other Ambulatory Visit (HOSPITAL_COMMUNITY)
Admission: RE | Admit: 2019-07-19 | Discharge: 2019-07-19 | Disposition: A | Discharge status: Home / Self Care | Payer: PPO | Source: Ambulatory Visit | Attending: Surgery | Admitting: Surgery

## 2019-07-19 DIAGNOSIS — Z20822 Contact with and (suspected) exposure to covid-19: Secondary | ICD-10-CM | POA: Diagnosis not present

## 2019-07-19 DIAGNOSIS — Z01812 Encounter for preprocedural laboratory examination: Secondary | ICD-10-CM | POA: Insufficient documentation

## 2019-07-19 LAB — SARS CORONAVIRUS 2 (TAT 6-24 HRS): SARS Coronavirus 2: NEGATIVE

## 2019-07-22 ENCOUNTER — Encounter (HOSPITAL_COMMUNITY)
Admission: RE | Disposition: A | Discharge status: Home / Self Care | Payer: Self-pay | Source: Home / Self Care | Attending: Surgery

## 2019-07-22 ENCOUNTER — Ambulatory Visit (HOSPITAL_COMMUNITY)
Admission: RE | Admit: 2019-07-22 | Discharge: 2019-07-22 | Disposition: A | Discharge status: Home / Self Care | Payer: PPO | Attending: Surgery | Admitting: Surgery

## 2019-07-22 ENCOUNTER — Other Ambulatory Visit: Payer: Self-pay

## 2019-07-22 DIAGNOSIS — Z79899 Other long term (current) drug therapy: Secondary | ICD-10-CM | POA: Insufficient documentation

## 2019-07-22 DIAGNOSIS — I10 Essential (primary) hypertension: Secondary | ICD-10-CM | POA: Diagnosis not present

## 2019-07-22 DIAGNOSIS — K551 Chronic vascular disorders of intestine: Secondary | ICD-10-CM | POA: Insufficient documentation

## 2019-07-22 DIAGNOSIS — I252 Old myocardial infarction: Secondary | ICD-10-CM | POA: Insufficient documentation

## 2019-07-22 DIAGNOSIS — M1990 Unspecified osteoarthritis, unspecified site: Secondary | ICD-10-CM | POA: Insufficient documentation

## 2019-07-22 DIAGNOSIS — E7800 Pure hypercholesterolemia, unspecified: Secondary | ICD-10-CM | POA: Diagnosis not present

## 2019-07-22 DIAGNOSIS — Z7902 Long term (current) use of antithrombotics/antiplatelets: Secondary | ICD-10-CM | POA: Insufficient documentation

## 2019-07-22 DIAGNOSIS — E039 Hypothyroidism, unspecified: Secondary | ICD-10-CM | POA: Diagnosis not present

## 2019-07-22 DIAGNOSIS — F17210 Nicotine dependence, cigarettes, uncomplicated: Secondary | ICD-10-CM | POA: Insufficient documentation

## 2019-07-22 DIAGNOSIS — Z79890 Hormone replacement therapy (postmenopausal): Secondary | ICD-10-CM | POA: Diagnosis not present

## 2019-07-22 DIAGNOSIS — K219 Gastro-esophageal reflux disease without esophagitis: Secondary | ICD-10-CM | POA: Insufficient documentation

## 2019-07-22 DIAGNOSIS — I2510 Atherosclerotic heart disease of native coronary artery without angina pectoris: Secondary | ICD-10-CM | POA: Diagnosis not present

## 2019-07-22 DIAGNOSIS — F329 Major depressive disorder, single episode, unspecified: Secondary | ICD-10-CM | POA: Diagnosis not present

## 2019-07-22 DIAGNOSIS — E785 Hyperlipidemia, unspecified: Secondary | ICD-10-CM | POA: Insufficient documentation

## 2019-07-22 DIAGNOSIS — Z7982 Long term (current) use of aspirin: Secondary | ICD-10-CM | POA: Insufficient documentation

## 2019-07-22 DIAGNOSIS — T82856A Stenosis of peripheral vascular stent, initial encounter: Secondary | ICD-10-CM | POA: Diagnosis not present

## 2019-07-22 DIAGNOSIS — J449 Chronic obstructive pulmonary disease, unspecified: Secondary | ICD-10-CM | POA: Diagnosis not present

## 2019-07-22 DIAGNOSIS — G7000 Myasthenia gravis without (acute) exacerbation: Secondary | ICD-10-CM | POA: Insufficient documentation

## 2019-07-22 HISTORY — PX: PERIPHERAL VASCULAR BALLOON ANGIOPLASTY: CATH118281

## 2019-07-22 HISTORY — PX: VISCERAL ANGIOGRAPHY: CATH118276

## 2019-07-22 LAB — POCT I-STAT, CHEM 8
BUN: 16 mg/dL (ref 8–23)
Calcium, Ion: 1.05 mmol/L — ABNORMAL LOW (ref 1.15–1.40)
Chloride: 104 mmol/L (ref 98–111)
Creatinine, Ser: 0.9 mg/dL (ref 0.44–1.00)
Glucose, Bld: 93 mg/dL (ref 70–99)
HCT: 35 % — ABNORMAL LOW (ref 36.0–46.0)
Hemoglobin: 11.9 g/dL — ABNORMAL LOW (ref 12.0–15.0)
Potassium: 4.3 mmol/L (ref 3.5–5.1)
Sodium: 141 mmol/L (ref 135–145)
TCO2: 31 mmol/L (ref 22–32)

## 2019-07-22 SURGERY — VISCERAL ANGIOGRAPHY
Anesthesia: LOCAL

## 2019-07-22 MED ORDER — SODIUM CHLORIDE 0.9% FLUSH: 3.0000 mL | INTRAVENOUS | Status: DC | PRN

## 2019-07-22 MED ORDER — HEPARIN (PORCINE) IN NACL 1000-0.9 UT/500ML-% IV SOLN
INTRAVENOUS | Status: DC | PRN
  Administered 2019-07-22 (×2): 500 mL

## 2019-07-22 MED ORDER — FENTANYL CITRATE (PF) 100 MCG/2ML IJ SOLN
INTRAMUSCULAR | Status: AC
  Filled 2019-07-22: qty 2

## 2019-07-22 MED ORDER — OXYCODONE HCL 5 MG PO TABS: 5.0000 mg | ORAL_TABLET | ORAL | Status: DC | PRN

## 2019-07-22 MED ORDER — ONDANSETRON HCL 4 MG/2ML IJ SOLN: 4.0000 mg | Freq: Four times a day (QID) | INTRAMUSCULAR | Status: DC | PRN

## 2019-07-22 MED ORDER — ACETAMINOPHEN 325 MG PO TABS: 650.0000 mg | ORAL_TABLET | ORAL | Status: DC | PRN

## 2019-07-22 MED ORDER — SODIUM CHLORIDE 0.9% FLUSH: 3.0000 mL | Freq: Two times a day (BID) | INTRAVENOUS | Status: DC

## 2019-07-22 MED ORDER — SODIUM CHLORIDE 0.9 % IV SOLN: INTRAVENOUS | Status: DC

## 2019-07-22 MED ORDER — IODIXANOL 320 MG/ML IV SOLN
INTRAVENOUS | Status: DC | PRN
  Administered 2019-07-22: 70 mL via INTRA_ARTERIAL

## 2019-07-22 MED ORDER — MORPHINE SULFATE (PF) 10 MG/ML IV SOLN: 2.0000 mg | INTRAVENOUS | Status: DC | PRN

## 2019-07-22 MED ORDER — HEPARIN SODIUM (PORCINE) 1000 UNIT/ML IJ SOLN
INTRAMUSCULAR | Status: DC | PRN
  Administered 2019-07-22: 6000 [IU] via INTRAVENOUS

## 2019-07-22 MED ORDER — SODIUM CHLORIDE 0.9 % IV SOLN: 250.0000 mL | INTRAVENOUS | Status: DC | PRN

## 2019-07-22 MED ORDER — FENTANYL CITRATE (PF) 100 MCG/2ML IJ SOLN
INTRAMUSCULAR | Status: DC | PRN
  Administered 2019-07-22: 25 ug via INTRAVENOUS

## 2019-07-22 MED ORDER — HYDRALAZINE HCL 20 MG/ML IJ SOLN: 5.0000 mg | INTRAMUSCULAR | Status: DC | PRN

## 2019-07-22 MED ORDER — MIDAZOLAM HCL 2 MG/2ML IJ SOLN
INTRAMUSCULAR | Status: DC | PRN
  Administered 2019-07-22: 1 mg via INTRAVENOUS

## 2019-07-22 MED ORDER — LABETALOL HCL 5 MG/ML IV SOLN: 10.0000 mg | INTRAVENOUS | Status: DC | PRN

## 2019-07-22 MED ORDER — HEPARIN SODIUM (PORCINE) 1000 UNIT/ML IJ SOLN
INTRAMUSCULAR | Status: AC
  Filled 2019-07-22: qty 1

## 2019-07-22 MED ORDER — LIDOCAINE HCL (PF) 1 % IJ SOLN
INTRAMUSCULAR | Status: AC
  Filled 2019-07-22: qty 30

## 2019-07-22 MED ORDER — LIDOCAINE HCL (PF) 1 % IJ SOLN
INTRAMUSCULAR | Status: DC | PRN
  Administered 2019-07-22: 15 mL via INTRADERMAL

## 2019-07-22 MED ORDER — MIDAZOLAM HCL 2 MG/2ML IJ SOLN
INTRAMUSCULAR | Status: AC
  Filled 2019-07-22: qty 2

## 2019-07-22 MED ORDER — HEPARIN (PORCINE) IN NACL 1000-0.9 UT/500ML-% IV SOLN
INTRAVENOUS | Status: AC
  Filled 2019-07-22: qty 1000

## 2019-07-22 MED ORDER — SODIUM CHLORIDE 0.9 % WEIGHT BASED INFUSION: 1.0000 mL/kg/h | INTRAVENOUS | Status: DC

## 2019-07-22 SURGICAL SUPPLY — 22 items
BALLN IN.PACT DCB 5X40 (BALLOONS) ×3
CATH ANGIO 5F BER2 65CM (CATHETERS) ×3 IMPLANT
CATH CROSS OVER TEMPO 5F (CATHETERS) ×3 IMPLANT
CATH OMNI FLUSH 5F 65CM (CATHETERS) ×3 IMPLANT
CATH SOS OMNI O 5F 80CM (CATHETERS) ×3 IMPLANT
CLOSURE MYNX CONTROL 6F/7F (Vascular Products) ×3 IMPLANT
DCB IN.PACT 5X40 (BALLOONS) ×2 IMPLANT
DCB RANGER 5.0X40 135 (BALLOONS) ×2 IMPLANT
KIT ENCORE 26 ADVANTAGE (KITS) ×3 IMPLANT
KIT MICROPUNCTURE NIT STIFF (SHEATH) ×3 IMPLANT
KIT PV (KITS) ×3 IMPLANT
RANGER DCB 5.0X40 135 (BALLOONS) ×3
SHEATH FLEX ANSEL ANG 6F 45CM (SHEATH) ×3 IMPLANT
SHEATH PINNACLE 5F 10CM (SHEATH) ×3 IMPLANT
SHEATH PINNACLE 6F 10CM (SHEATH) ×3 IMPLANT
SHEATH PROBE COVER 6X72 (BAG) ×3 IMPLANT
SYR MEDRAD MARK V 150ML (SYRINGE) ×3 IMPLANT
TRANSDUCER W/STOPCOCK (MISCELLANEOUS) ×3 IMPLANT
TRAY PV CATH (CUSTOM PROCEDURE TRAY) ×3 IMPLANT
WIRE BENTSON .035X145CM (WIRE) ×6 IMPLANT
WIRE ROSEN-J .035X260CM (WIRE) ×3 IMPLANT
WIRE SPARTACORE .014X300CM (WIRE) ×3 IMPLANT

## 2019-07-22 NOTE — Progress Notes (Signed)
Ambulated in hallway and to bathroom to void tol well. No bleeding noted before of after ambulation.

## 2019-07-22 NOTE — Interval H&P Note (Signed)
History and Physical Interval Note:  07/22/2019 9:53 AM  Brandi Raymond  has presented today for surgery, with the diagnosis of mesenteric insufficeny.  The various methods of treatment have been discussed with the patient and family. After consideration of risks, benefits and other options for treatment, the patient has consented to  Procedure(s): MESENTERIC ANGIOGRAPHY (N/A) as a surgical intervention.  The patient's history has been reviewed, patient examined, no change in status, stable for surgery.  I have reviewed the patient's chart and labs.  Questions were answered to the patient's satisfaction.     Annamarie Major

## 2019-07-22 NOTE — Discharge Instructions (Signed)
   [facility] of [facility]  Discharge Instructions  Angioplasty/Stenting  Please refer to the following instructions for your post-procedure care. Your surgeon or physician assistant will discuss any changes with you.  Activity  Avoid lifting more than 8 pounds (1 gallons of milk) for 72 hours (3 days) after your procedure. You may walk as much as you can tolerate. It's OK to drive after 72 hours.  Bathing/Showering  You may shower the day after your procedure. If you have a bandage, you may remove it at 24- 48 hours. Clean your incision site with mild soap and water. Pat the area dry with a clean towel.  Diet  Resume your pre-procedure diet. There are no special food restrictions following this procedure. All patients with peripheral vascular disease should follow a low fat/low cholesterol diet. In order to heal from your surgery, it is CRITICAL to get adequate nutrition. Your body requires vitamins, minerals, and protein. Vegetables are the best source of vitamins and minerals. Vegetables also provide the perfect balance of protein. Processed food has little nutritional value, so try to avoid this.  Medications  Resume taking all of your medications unless your doctor tells you not to. If your incision is causing pain, you may take over-the-counter pain relievers such as acetaminophen (Tylenol)  Follow Up  Follow up will be arranged at the time of your procedure. You may have an office visit scheduled or may be scheduled for surgery. Ask your surgeon if you have any questions.  Please call us immediately for any of the following conditions: .Severe or worsening pain your legs or feet at rest or with walking. .Increased pain, redness, drainage at your groin puncture site. .Fever of 101 degrees or higher. .If you have any mild or slow bleeding from your puncture site: lie down, apply firm constant pressure over the area with a piece of gauze or a clean wash cloth  for 30 minutes- no peeking!, call 911 right away if you are still bleeding after 30 minutes, or if the bleeding is heavy and unmanageable.  Reduce your risk factors of vascular disease:  Stop smoking. If you would like help call [facility] at 1-800-QUIT-NOW 5313146725) or [place] at [phone]. Manage your cholesterol Maintain a desired weight Control your diabetes Keep your blood pressure down  If you have any questions, please call the office at [phone]

## 2019-07-22 NOTE — Op Note (Signed)
    Patient name: Brandi Raymond MRN: [id number] DOB: [birth year] Sex: female  07/22/2019 Pre-operative Diagnosis: SMA in-stent stenosis Post-operative diagnosis:  Same Surgeon:  Annamarie Major Procedure Performed:  1.  Ultrasound-guided access, right femoral artery  2.  Abdominal aortogram  3.  First order catheterization  4.  Drug-coated balloon angioplasty, supra mesenteric artery  5.  Conscious sedation 72 minutes  6.  Closure device, Mynx   Indications: The patient has had multiple vascular interventions on her supra mesenteric artery.  We have been following progressive velocity elevations on ultrasound and she is starting to have recurrent abdominal pain.  Therefore she comes in today for further evaluation and possible invention.  Procedure:  The patient was identified in the holding area and taken to room 8.  The patient was then placed supine on the table and prepped and draped in the usual sterile fashion.  A time out was called.  Conscious sedation was administered with the use of IV fentanyl and Versed under continuous physician and nurse monitoring.  Heart rate, blood pressure, and oxygen saturation were continuously monitored.  Total sedation time was 72 minutes.  Ultrasound was used to evaluate the right common femoral artery.  It was patent .  A digital ultrasound image was acquired.  A micropuncture needle was used to access the right common femoral artery under ultrasound guidance.  An 018 wire was advanced without resistance and a micropuncture sheath was placed.  The 018 wire was removed and a benson wire was placed.  The micropuncture sheath was exchanged for a 5 french sheath.  An omniflush catheter was advanced over the wire to the level of T12 and an abdominal aortogram was performed in the lateral projection.  Next using a SOS catheter, the superior mesenteric artery stent was cannulated and contrast injections were performed Findings:   Aortogram: The infrarenal abdominal  aorta is heavily calcified with small aneurysmal degeneration.  The celiac artery appears to have occluded.  The stent within the superior mesenteric artery is patent.  Superior mesenteric artery: There is a stent at the origin of the superior mesenteric artery.  Through the supra mesenteric artery, collaterals fill the splenic artery and celiac axis.  The origin to the celiac artery is not visualized.  I have difficult time imaging the ostium of the superior mesenteric artery however there was a 40 mm pressure gradient   Intervention: Based off of the 40 mm pressure gradient at the origin of the superior mesenteric artery, I elected to proceed with intervention.  A 6 French 45 cm Ansell 1 sheath was placed and the patient was fully heparinized.  The supra mesenteric artery was then selected with a sauce catheter.  I then placed a Rosen wire.  Drug-coated balloon angioplasty was then performed using a 5 x 40 impact Admiral balloon for 2 minutes at burst pressure.  It was difficult getting good angiographic images of the origin of the stent.  Distally it remains widely patent.  There did appear to be a drop in the pressure gradient now approximately 20 mm.  Closure was performed with Mynx.  There were no complications  Impression:  #1  Celiac artery occlusion  #2  40 mm pressure gradient at the origin of the superior mesenteric artery treated with drug-coated balloon angioplasty.  No residual stenosis was identified and pressure gradient had decreased down to 15-20.   Theotis Burrow, M.D., [facility] [place] Office: [phone] Pager:  [phone]

## 2019-07-22 NOTE — Progress Notes (Signed)
Discharge instructions reviewed with pt and her daughter (via telephone) both voice understanding.  

## 2019-07-22 NOTE — Telephone Encounter (Signed)
Error

## 2019-07-23 ENCOUNTER — Other Ambulatory Visit: Payer: Self-pay | Admitting: *Deleted

## 2019-07-23 DIAGNOSIS — I6523 Occlusion and stenosis of bilateral carotid arteries: Secondary | ICD-10-CM

## 2019-07-23 DIAGNOSIS — K551 Chronic vascular disorders of intestine: Secondary | ICD-10-CM

## 2019-07-25 ENCOUNTER — Telehealth: Payer: Self-pay | Admitting: Pharmacist

## 2019-07-25 NOTE — Telephone Encounter (Signed)
Called patient on 07/25/2019 at 12:12 PM   Patient's husband answer stating that she is taking a nap. Left message to call clinic back to discuss smoking cessation. Patient's husband states he doesn't think she has smoked any cigarettes recently.   Will plan to call to confirm patient's smoking status in 1-2 weeks.  Drexel Iha, PharmD PGY2 Ambulatory Care Pharmacy Resident

## 2019-07-28 ENCOUNTER — Other Ambulatory Visit: Payer: Self-pay | Admitting: Pulmonary Disease

## 2019-08-01 ENCOUNTER — Other Ambulatory Visit: Payer: Self-pay | Admitting: Internal Medicine

## 2019-08-01 NOTE — Telephone Encounter (Signed)
Check [place] registry last filled 07/02/2019.Marland KitchenJohny Chess

## 2019-08-05 ENCOUNTER — Other Ambulatory Visit: Payer: Self-pay

## 2019-08-05 ENCOUNTER — Telehealth: Payer: Self-pay | Admitting: Internal Medicine

## 2019-08-05 ENCOUNTER — Encounter: Payer: Self-pay | Admitting: Internal Medicine

## 2019-08-05 ENCOUNTER — Ambulatory Visit (INDEPENDENT_AMBULATORY_CARE_PROVIDER_SITE_OTHER): Payer: PPO | Admitting: Internal Medicine

## 2019-08-05 ENCOUNTER — Ambulatory Visit (INDEPENDENT_AMBULATORY_CARE_PROVIDER_SITE_OTHER): Payer: PPO

## 2019-08-05 VITALS — BP 96/84 | HR 43 | Wt 129.0 lb

## 2019-08-05 DIAGNOSIS — R0602 Shortness of breath: Secondary | ICD-10-CM

## 2019-08-05 DIAGNOSIS — R0789 Other chest pain: Secondary | ICD-10-CM | POA: Diagnosis not present

## 2019-08-05 MED ORDER — DOXYCYCLINE HYCLATE 100 MG PO TABS: 100.0000 mg | ORAL_TABLET | Freq: Two times a day (BID) | ORAL | 0 refills | Status: DC

## 2019-08-05 MED ORDER — LISINOPRIL 5 MG PO TABS: 10.0000 mg | ORAL_TABLET | Freq: Every day | ORAL | 3 refills | Status: DC

## 2019-08-05 NOTE — Assessment & Plan Note (Signed)
Left sided to left scapula ? Related to heart vs lungs CXR HR low and recently has been low Stop amlodipine EKG today  - sinus brady at 38 bpm, PVCs, intraventricular block.  Sinus brady is new compared to prior EKG 08/2018, no other significant change Needs to see Dr Percival Spanish Monitor BP, HR, oxygen level

## 2019-08-05 NOTE — Progress Notes (Signed)
Subjective:    Patient ID: Brandi Raymond, female    DOB: Jul 20, 1933, 86 y.o.   MRN: [id number]  HPI The patient is here for an acute visit for difficulty taking a deep breathe and chest pain.  She is here with her greatgranddaughter.   At least one month she has had a feeling like she is not breathing well.  She is on continuous oxygen 1 L per minute..  She has not been smoking.  She feels like she is not getting enough oxygen.  When she does something physical she gives out and can not breath well.  She feels SOB at rest at times.  She has had some chest tightness in the chest that radiates back the left shoulder blade.  She a cough once in a while, but no wheeze.  She denies fever, cold symptoms.  She has occ headaches nad lightheadedness.   Her oxygen at home has been 92-96%.  She does not check her BP.     Medications and allergies reviewed with patient and updated if appropriate.  Patient Active Problem List   Diagnosis Date Noted  . GERD (gastroesophageal reflux disease) 05/21/2019  . Chronic respiratory failure with hypoxia ([place]) 05/21/2019  . Medication management 05/21/2019  . Stage I squamous cell carcinoma of right lung ([place]) 02/06/2019  . Constipation 09/11/2018  . COPD with acute exacerbation ([place]) 09/04/2018  . Right lower lobe lung mass 09/04/2018  . Closed compression fracture of L1 lumbar vertebra, initial encounter ([place]) 09/04/2018  . Preoperative clearance 07/19/2018  . Lower back pain 06/25/2018  . Cubital tunnel syndrome on left 05/22/2018  . Dupuytren's contracture of both hands 05/10/2018  . Primary osteoarthritis of both knees 05/10/2018  . Difficulty urinating 05/10/2018  . Osteoporosis 06/14/2017  . Prediabetes 06/23/2015  . Depression 06/23/2015  . Carotid stenosis 10/12/2014  . Sleep disorder 04/09/2014  . Mesenteric artery stenosis ([place]) 01/13/2013  . Thoracic aneurysm without mention of rupture 11/04/2012  . Chronic mesenteric ischemia ([place])  10/08/2012  . Memory deficit 06/20/2012  . Irritable bowel syndrome 06/20/2012  . Ocular myasthenia gravis ([place]) 06/20/2012  . PVD (peripheral vascular disease) ([place]) 06/20/2012  . Hereditary and idiopathic peripheral neuropathy 05/22/2012  . Syncope 08/28/2011  . ABDOMINAL BRUIT 08/17/2009  . Coronary atherosclerosis 09/05/2008  . Hypothyroidism 05/26/2008  . VITAMIN D DEFICIENCY 05/26/2008  . CHOLELITHIASIS 12/06/2007  . DIVERTICULOSIS, COLON 12/05/2007  . HYPERLIPIDEMIA 08/19/2007  . COPD (chronic obstructive pulmonary disease) with emphysema ([place]) 08/19/2007  . CIGARETTE SMOKER 02/06/2007  . Essential hypertension 02/06/2007  . COLONIC POLYPS 11/21/2004    Current Outpatient Medications on File Prior to Visit  Medication Sig Dispense Refill  . ANORO ELLIPTA 62.5-25 MCG/INH AEPB TAKE 1 PUFF BY MOUTH EVERY DAY 60 each 1  . aspirin 81 MG tablet Take 81 mg by mouth daily.      . Calcium-Magnesium-Vitamin D (CALCIUM 1200+D3 PO) Take 1 tablet by mouth daily.    . clonazePAM (KLONOPIN) 0.5 MG tablet TAKE 1 TABLET BY MOUTH EVERYDAY AT BEDTIME 30 tablet 0  . clopidogrel (PLAVIX) 75 MG tablet TAKE 1 TABLET BY MOUTH EVERY DAY (Patient taking differently: Take 75 mg by mouth daily. ) 90 tablet 3  . docusate sodium (COLACE) 100 MG capsule Take 100 mg by mouth 2 (two) times daily.    Marland Kitchen donepezil (ARICEPT) 10 MG tablet TAKE 1 TABLET BY MOUTH EVERYDAY AT BEDTIME (Patient taking differently: Take 10 mg by mouth daily with supper. ) 90  tablet 3  . DULoxetine (CYMBALTA) 60 MG capsule TAKE 1 CAPSULE BY MOUTH EVERY DAY (Patient taking differently: Take 60 mg by mouth daily. ) 90 capsule 2  . famotidine (PEPCID) 20 MG tablet Take 1 tablet (20 mg total) by mouth 2 (two) times daily. 180 tablet 1  . gabapentin (NEURONTIN) 300 MG capsule TAKE ONE CAPSULE TWICE DAILY AND 2 AT NIGHT = FOUR TOTAL DAILY. (Patient taking differently: Take 300-600 mg by mouth See admin instructions. Take 1 capsule (300 mg) by  mouth in the morning, take 1 capsule (300 mg) by mouth in the afternoon, & take 2 capsules (600 mg) at bedtime.) 360 capsule 1  . levothyroxine (SYNTHROID) 125 MCG tablet TAKE 1 TABLET (125 MCG TOTAL) BY MOUTH DAILY BEFORE BREAKFAST. 90 tablet 1  . Multiple Vitamins-Minerals (PRESERVISION AREDS 2 PO) Take 1 capsule by mouth 2 (two) times daily.    . nicotine (NICODERM CQ - DOSED IN MG/24 HOURS) 14 mg/24hr patch Place 1 patch (14 mg total) onto the skin daily. 28 patch 0  . nicotine polacrilex (COMMIT) 4 MG lozenge Take 1 lozenge (4 mg total) by mouth as needed for smoking cessation. 100 tablet 2  . nicotine polacrilex (NICORETTE) 4 MG gum Take 1 each (4 mg total) by mouth as needed for smoking cessation. 100 tablet 0  . pantoprazole (PROTONIX) 40 MG tablet Take 1 tablet (40 mg total) by mouth daily. 90 tablet 1  . pravastatin (PRAVACHOL) 40 MG tablet TAKE 1 TABLET BY MOUTH EVERY DAY (Patient taking differently: Take 40 mg by mouth daily with supper. ) 90 tablet 2  . PROLIA 60 MG/ML SOSY injection Inject 1 each into the skin every 6 (six) months.     No current facility-administered medications on file prior to visit.    Past Medical History:  Diagnosis Date  . Acute MI inferior subsequent episode care [facility]) 1993   PTCA RCA  . Adenomatous colon polyp   . Arthritis   . CAD (coronary artery disease)    Dr Percival Spanish  . Carotid artery occlusion   . Cervical spine fracture ([place])   . COPD (chronic obstructive pulmonary disease) ([place])   . Depression   . Diverticulosis   . Dyslipidemia   . Gastritis 09/15/1991  . GERD (gastroesophageal reflux disease) 09/15/1991   Dr Sharlett Iles  . Hiatal hernia 09/15/1991  . Hip fracture, right ([place])   . HLD (hyperlipidemia)   . HTN (hypertension)   . Hyperplastic polyps of stomach 11/2007   colonoscopy  . Hypertension   . Hypothyroidism    affecting the left eye, proptosis  . Iron deficiency anemia   . Macular degeneration of left eye   . Memory loss   .  Mesenteric artery stenosis ([place])   . Myasthenia gravis ([place])    With ocular features  . Myocardial infarction ([place]) 1993  . Ocular myasthenia gravis ([place])    Dr Jannifer Franklin  . Orthostatic hypotension 06/05/2013  . Pneumonia    hx  . PONV (postoperative nausea and vomiting)   . Shortness of breath dyspnea    occ  . Strabismus    left eye  . Syncope 1998    Past Surgical History:  Procedure Laterality Date  . ABDOMINAL AORTAGRAM N/A 10/15/2012   Procedure: ABDOMINAL Maxcine Ham;  Surgeon: Serafina Mitchell, MD;  Location: [facility] CATH LAB;  Service: Cardiovascular;  Laterality: N/A;  . arm surgery Left    fx  . BALLOON ANGIOPLASTY, ARTERY  1993  . CARDIAC  CATHETERIZATION  1996   LAD 20/50, CFX OK, RCA 30 at prev PTCA site, EF with mild HK inferior wall  . CAROTID ANGIOGRAM N/A 10/28/2014   Procedure: CAROTID ANGIOGRAM;  Surgeon: Serafina Mitchell, MD;  Location[facility] CATH LAB;  Service: Cardiovascular;  Laterality: N/A;  . CAROTID ENDARTERECTOMY    . CATARACT EXTRACTION     bilateral  . COLONOSCOPY W/ POLYPECTOMY  2006   Adenomatous polyps  . ENDARTERECTOMY Left 01/14/2015   Procedure: LEFT CAROTID ENDARTERECTOMY ;  Surgeon: Serafina Mitchell, MD;  Location: [place];  Service: Vascular;  Laterality: Left;  . ENDARTERECTOMY Right 08/12/2015   Procedure: ENDARTERECTOMY CAROTID WITH PATCH ANGIOPLASTY;  Surgeon: Serafina Mitchell, MD;  Location: [place];  Service: Vascular;  Laterality: Right;  . EYE MUSCLE SURGERY Left 11/04/2015  . EYE SURGERY Bilateral May 2016   Eyelids  . FOOT SURGERY Left   . LEG SURGERY Left    laceration  . MIDDLE EAR SURGERY Left 1970  . PERCUTANEOUS STENT INTERVENTION  12/03/2012   Procedure: PERCUTANEOUS STENT INTERVENTION;  Surgeon: Serafina Mitchell, MD;  Location: [facility] CATH LAB;  Service: Cardiovascular;;  sma stent x1  . PERIPHERAL VASCULAR BALLOON ANGIOPLASTY  07/22/2019   Procedure: PERIPHERAL VASCULAR BALLOON ANGIOPLASTY;  Surgeon: Serafina Mitchell, MD;  Location: [place] CV  LAB;  Service: Cardiovascular;;  Superior mesenteric  . STRABISMUS SURGERY Left 10/28/2015   Procedure: REPAIR STRABISMUS LEFT EYE;  Surgeon: Lamonte Sakai, MD;  Location: [place];  Service: Ophthalmology;  Laterality: Left;  . Third-degree Zoee Heeney  2003   [facility]-legs ,buttocks,arms  . TOTAL ABDOMINAL HYSTERECTOMY  1973   Dysfunctional menses  . UPPER GI ENDOSCOPY      Dr Sharlett Iles  . VISCERAL ANGIOGRAM N/A 10/15/2012   Procedure: VISCERAL ANGIOGRAM;  Surgeon: Serafina Mitchell, MD;  Location: [facility] CATH LAB;  Service: Cardiovascular;  Laterality: N/A;  . VISCERAL ANGIOGRAM N/A 12/03/2012   Procedure: VISCERAL ANGIOGRAM;  Surgeon: Serafina Mitchell, MD;  Location: [facility] CATH LAB;  Service: Cardiovascular;  Laterality: N/A;  . VISCERAL ANGIOGRAM N/A 08/05/2013   Procedure: MESENTERIC ANGIOGRAM;  Surgeon: Serafina Mitchell, MD;  Location: [facility] CATH LAB;  Service: Cardiovascular;  Laterality: N/A;  . VISCERAL ANGIOGRAM N/A 10/28/2014   Procedure: VISCERAL ANGIOGRAM;  Surgeon: Serafina Mitchell, MD;  Location: [facility] CATH LAB;  Service: Cardiovascular;  Laterality: N/A;  . VISCERAL ANGIOGRAPHY N/A 07/22/2019   Procedure: MESENTERIC ANGIOGRAPHY;  Surgeon: Serafina Mitchell, MD;  Location: [place] CV LAB;  Service: Cardiovascular;  Laterality: N/A;    Social History   Socioeconomic History  . Marital status: Married    Spouse name: Not on file  . Number of children: 3  . Years of education: 9th  . Highest education level: Not on file  Occupational History  . Occupation: Retired  Tobacco Use  . Smoking status: Current Some Day Smoker    Packs/day: 1.00    Years: 60.00    Pack years: 60.00    Types: Cigarettes  . Smokeless tobacco: Never Used  . Tobacco comment: 3-10 cigarettes a day 05/21/2019  Substance and Sexual Activity  . Alcohol use: No    Alcohol/week: 0.0 standard drinks  . Drug use: No  . Sexual activity: Never  Other Topics Concern  . Not on file  Social History Narrative   Patient is  right handed.   Patient drinks 3-4 cups of caffeine daily.   Social Determinants of Health  Financial Resource Strain:   . Difficulty of Paying Living Expenses: Not on file  Food Insecurity:   . Worried About Charity fundraiser in the Last Year: Not on file  . Ran Out of Food in the Last Year: Not on file  Transportation Needs:   . Lack of Transportation (Medical): Not on file  . Lack of Transportation (Non-Medical): Not on file  Physical Activity:   . Days of Exercise per Week: Not on file  . Minutes of Exercise per Session: Not on file  Stress:   . Feeling of Stress : Not on file  Social Connections:   . Frequency of Communication with Friends and Family: Not on file  . Frequency of Social Gatherings with Friends and Family: Not on file  . Attends Religious Services: Not on file  . Active Member of Clubs or Organizations: Not on file  . Attends Archivist Meetings: Not on file  . Marital Status: Not on file    Family History  Problem Relation Age of Onset  . Throat cancer Mother        ? thyroid cancer  . Cancer Mother   . Emphysema Father   . Diabetes Father   . Heart attack Father 14  . Colon cancer Brother   . Cerebral aneurysm Brother   . Hypothyroidism Sister        X56  . Cancer Brother        Ear  . Diabetes Paternal Grandmother   . Diabetes Paternal Grandfather   . Diabetes Maternal Aunt     Review of Systems  Constitutional: Negative for appetite change, chills and fever.  HENT: Negative for congestion, ear pain, sinus pressure and sore throat.   Respiratory: Positive for cough (once in a while), chest tightness (through to under left shoulder blade) and shortness of breath. Negative for wheezing.   Cardiovascular: Negative for chest pain (uncomfortable on left side), palpitations and leg swelling.  Neurological: Positive for light-headedness (occ) and headaches (occ).       Objective:   Vitals:   08/05/19 1459  BP: 96/84  Pulse: (!)  43  SpO2: 96%   BP Readings from Last 3 Encounters:  08/05/19 96/84  07/22/19 (!) 148/87  07/07/19 (!) 168/68   Wt Readings from Last 3 Encounters:  08/05/19 129 lb (58.5 kg)  07/22/19 127 lb (57.6 kg)  07/07/19 129 lb (58.5 kg)   Body mass index is 21.47 kg/m.  ** BP was taken on left arm with is falsely low due to vascular disease.   Physical Exam    Constitutional: chronically ill appearing. No distress. Wearing oxygen speaking in full sentences Head: Normocephalic and atraumatic.  Neck: Neck supple. No tracheal deviation present. No thyromegaly present.  No cervical lymphadenopathy Cardiovascular: Normal rate, regular rhythm and normal heart sounds.  2/6 systolic murmur heard.   No edema Pulmonary/Chest: Effort normal. No respiratory distress. Decreased BS diffusely. No has no wheezes. No rales.  Skin: Skin is warm and dry. Not diaphoretic.  Psychiatric: Normal mood and affect. Behavior is normal.       Assessment & Plan:    See Problem List for Assessment and Plan of chronic medical problems.    This visit occurred during the SARS-CoV-2 public health emergency.  Safety protocols were in place, including screening questions prior to the visit, additional usage of staff PPE, and extensive cleaning of exam room while observing appropriate contact time as indicated for disinfecting solutions.

## 2019-08-05 NOTE — Patient Instructions (Addendum)
Have a chest x-ray today.   Your EKG showed that your heart is low.     Stop amlodipine.  Continue lisinopril 10 mg daily.     Monitor your BP, heart rate and oxygen and keep a log of these.

## 2019-08-05 NOTE — Assessment & Plan Note (Signed)
SOB with exertion and rest, associated with some chest tightness Oxygen level is good CXR today - r/o PNA May need to see pulm given history

## 2019-08-05 NOTE — Telephone Encounter (Signed)
doxycycline   DG Chest 2 View CLINICAL DATA:  Shortness of breath and left-sided chest discomfort.  EXAM: CHEST - 2 VIEW  COMPARISON:  October 14, 2018  FINDINGS: The lungs are hyperinflated.  Moderate severity chronic appearing increased interstitial lung markings are noted.  Mild infiltrate is seen overlying the mid right lung with mild areas of atelectasis and/or infiltrate noted within the bilateral lung bases.  An ill-defined approximately 1.2 cm opacity is seen overlying the lateral aspect of the left apex. This represents a new finding when compared to the prior study.  There is no evidence of a pleural effusion or pneumothorax.  The heart size and mediastinal contours are within normal limits. There is marked severity calcification of the thoracic aorta.  Moderate to marked severity multilevel degenerative changes seen throughout the thoracic spine.  IMPRESSION: 1. Mild mid right lung infiltrate with mild bibasilar atelectasis and/or infiltrate. 2. Small, ill-defined opacity along the lateral aspect of the left apex. Interval development of a left apical lung nodule cannot be excluded. Correlation with follow-up chest CT is recommended. 3. Findings consistent with COPD.  Electronically Signed   By: Virgina Norfolk M.D.   On: 08/05/2019 16:18

## 2019-08-06 ENCOUNTER — Telehealth: Payer: Self-pay | Admitting: Internal Medicine

## 2019-08-06 DIAGNOSIS — R911 Solitary pulmonary nodule: Secondary | ICD-10-CM

## 2019-08-06 NOTE — Telephone Encounter (Signed)
Please call her to see how she is feeling.  We will fax over her EKG to cardiology.  Please make sure she is monitoring her blood pressure, heart rate and oxygen level.  The chest x-ray as we discussed last night does show possible pneumonia and hopefully the antibiotic will help with that.  There was a small area in the top of her left lung that may be a new nodule.  A CT scan is recommended to further evaluate.  I know she is scheduled to have a CT scan in May, but we should consider doing one sooner.  I can order this if okay with her to be done in the next couple of weeks.  Let me know.

## 2019-08-06 NOTE — Telephone Encounter (Signed)
They would like to proceed with CT order. Daughter will check vitals this morning and send [place] the results through mychart. Daughter said her HR last night was around 44. Her systolic reading was 525 yesterday but she does not remember the diastolic number.

## 2019-08-06 NOTE — Telephone Encounter (Signed)
CT ordered. 

## 2019-08-08 ENCOUNTER — Ambulatory Visit (INDEPENDENT_AMBULATORY_CARE_PROVIDER_SITE_OTHER)
Admission: RE | Admit: 2019-08-08 | Discharge: 2019-08-08 | Disposition: A | Discharge status: Home / Self Care | Payer: PPO | Source: Ambulatory Visit | Attending: Internal Medicine | Admitting: Internal Medicine

## 2019-08-08 ENCOUNTER — Other Ambulatory Visit: Payer: Self-pay

## 2019-08-08 ENCOUNTER — Encounter: Payer: Self-pay | Admitting: Cardiovascular Disease

## 2019-08-08 ENCOUNTER — Ambulatory Visit (INDEPENDENT_AMBULATORY_CARE_PROVIDER_SITE_OTHER): Payer: PPO | Admitting: Cardiovascular Disease

## 2019-08-08 VITALS — BP 152/62 | HR 45 | Temp 96.4°F | Ht 65.0 in | Wt 129.6 lb

## 2019-08-08 DIAGNOSIS — F17200 Nicotine dependence, unspecified, uncomplicated: Secondary | ICD-10-CM | POA: Diagnosis not present

## 2019-08-08 DIAGNOSIS — R911 Solitary pulmonary nodule: Secondary | ICD-10-CM

## 2019-08-08 DIAGNOSIS — I441 Atrioventricular block, second degree: Secondary | ICD-10-CM | POA: Diagnosis not present

## 2019-08-08 DIAGNOSIS — I2510 Atherosclerotic heart disease of native coronary artery without angina pectoris: Secondary | ICD-10-CM

## 2019-08-08 DIAGNOSIS — E782 Mixed hyperlipidemia: Secondary | ICD-10-CM

## 2019-08-08 DIAGNOSIS — R072 Precordial pain: Secondary | ICD-10-CM | POA: Diagnosis not present

## 2019-08-08 MED ORDER — IOHEXOL 300 MG/ML  SOLN
75.0000 mL | Freq: Once | INTRAMUSCULAR | Status: AC | PRN
  Administered 2019-08-08: 16:00:00 75 mL via INTRAVENOUS

## 2019-08-08 NOTE — Assessment & Plan Note (Signed)
History of essential hypertension blood pressure measured today 152/62.  She is on lisinopril.

## 2019-08-08 NOTE — Assessment & Plan Note (Signed)
EKG shows a ventricular spots of 44 with bundle branch block and 2-1 heart block.  Her her vital signs otherwise are unremarkable.  She is complaining of increasing shortness of breath.  I am to get a 2D echo and a Lexiscan Myoview stress test and will refer her to EP next week for consideration of permanent transvenous pacemaker.  She is on no negative chronotropic medications.

## 2019-08-08 NOTE — Assessment & Plan Note (Signed)
History of hyperlipidemia on statin therapy with lipid profile performed 12/13/2017 revealing a total cholesterol 200, LDL of 83 and HDL of 92.
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
## 2019-08-08 NOTE — Patient Instructions (Addendum)
Medication Instructions:  Your physician recommends that you continue on your current medications as directed. Please refer to the Current Medication list given to you today.  If you need a refill on your cardiac medications before your next appointment, please call your pharmacy.   Lab work: NONE  Testing/Procedures: NEXT WEEK Your physician has requested that you have an echocardiogram. Echocardiography is a painless test that uses sound waves to create images of your heart. It provides your doctor with information about the size and shape of your heart and how well your heart's chambers and valves are working. This procedure takes approximately one hour. There are no restrictions for this procedure. [place] has requested that you have a lexiscan myoview. For further information please visit [email or web address][email or web address]. Please follow instruction sheet, as given.  Follow-Up: At [facility], you and your health needs are our priority.  As part of our continuing mission to provide you with exceptional heart care, we have created designated Provider Care Teams.  These Care Teams include your primary Cardiologist (physician) and Advanced Practice Providers (APPs -  Physician Assistants and Nurse Practitioners) who all work together to provide you with the care you need, when you need it. You may see Dr. Gwenlyn Found or one of the following Advanced Practice Providers on your designated Care Team:    Kerin Ransom, PA-C  [place], [place]  Coletta Memos, [place]  Your physician wants you to follow-up in: 1 month with Dr. Gwenlyn Found  Any Other Special Instructions Will Be Listed Below (If Applicable)  You have been referred to Cardiac Electrophysiology

## 2019-08-08 NOTE — Assessment & Plan Note (Signed)
History of mesenteric artery stenting and reintervention for restenosis by Dr. Trula Slade this month.

## 2019-08-08 NOTE — Assessment & Plan Note (Signed)
History of CAD status post inferior wall myocardial infarction in 1993.  She had a Myoview stress test performed 12/10/2014 that showed bundle branch block artifact.  She denies chest pain but has had increasing dyspnea on exertion recently.

## 2019-08-08 NOTE — Assessment & Plan Note (Signed)
Recently discontinued tobacco abuse 5 months ago.  She does have a history of COPD and has been on home O2 for the last 6 to 8 months.

## 2019-08-09 ENCOUNTER — Encounter: Payer: Self-pay | Admitting: Internal Medicine

## 2019-08-11 DIAGNOSIS — J441 Chronic obstructive pulmonary disease with (acute) exacerbation: Secondary | ICD-10-CM | POA: Diagnosis not present

## 2019-08-12 ENCOUNTER — Telehealth: Payer: Self-pay | Admitting: Medical Oncology

## 2019-08-12 ENCOUNTER — Telehealth (HOSPITAL_COMMUNITY): Payer: Self-pay

## 2019-08-12 NOTE — Telephone Encounter (Signed)
08/08/19 CT  scan by PCP. Dtr called and wants Brandi Raymond to review scan  IMPRESSION: 1. Persistent mild to moderate severity right lower lobe scarring and/or atelectasis, unchanged in appearance when compared to the prior study dated May 09, 2019. 2. Interval development of a small area of patchy left upper lobe atelectasis and/or infiltrate since the prior exam. 3. Small bilateral pleural effusions which are new since the prior study. 4. Stable 5 mm noncalcified right upper lobe lung nodule. 5. Large gastric hernia.  Next scan is expected may 3 and f/u.

## 2019-08-12 NOTE — Telephone Encounter (Signed)
Nothing to be done at this point.  We will see her back in May with repeat scan.  She can call if she has any concerning symptoms in the interval.

## 2019-08-12 NOTE — Telephone Encounter (Signed)
Encounter complete. 

## 2019-08-12 NOTE — Telephone Encounter (Signed)
Pt.notified

## 2019-08-13 ENCOUNTER — Telehealth: Payer: Self-pay

## 2019-08-14 ENCOUNTER — Encounter (HOSPITAL_COMMUNITY): Payer: Self-pay | Admitting: *Deleted

## 2019-08-14 ENCOUNTER — Telehealth: Payer: PPO | Admitting: Internal Medicine

## 2019-08-14 ENCOUNTER — Ambulatory Visit (HOSPITAL_COMMUNITY)
Admission: RE | Admit: 2019-08-14 | Discharge: 2019-08-14 | Disposition: A | Discharge status: Home / Self Care | Payer: PPO | Source: Ambulatory Visit | Attending: Internal Medicine | Admitting: Internal Medicine

## 2019-08-14 ENCOUNTER — Other Ambulatory Visit: Payer: Self-pay

## 2019-08-14 DIAGNOSIS — R072 Precordial pain: Secondary | ICD-10-CM | POA: Diagnosis not present

## 2019-08-14 MED ORDER — TECHNETIUM TC 99M TETROFOSMIN IV KIT
9.8000 | PACK | Freq: Once | INTRAVENOUS | Status: AC | PRN
  Administered 2019-08-14: 9.8 via INTRAVENOUS
  Filled 2019-08-14: qty 10

## 2019-08-14 NOTE — Progress Notes (Signed)
Pt here for [employer] study. Pt in 2nd degree AV block with heart rate of 35 and 37 on EKG's. Dr Stanford Breed reviewed EKG's and Lexiscan stress study was canceled per him. Pt has a consult with Dr Rayann Heman on 02.01.21 to evaluate the 2nd degree block.

## 2019-08-15 ENCOUNTER — Inpatient Hospital Stay: Admission: RE | Admit: 2019-08-15 | Payer: PPO | Source: Ambulatory Visit

## 2019-08-18 ENCOUNTER — Encounter: Payer: Self-pay | Admitting: Internal Medicine

## 2019-08-18 ENCOUNTER — Other Ambulatory Visit (HOSPITAL_COMMUNITY): Payer: PPO

## 2019-08-18 ENCOUNTER — Telehealth (INDEPENDENT_AMBULATORY_CARE_PROVIDER_SITE_OTHER): Payer: PPO | Admitting: Internal Medicine

## 2019-08-18 ENCOUNTER — Telehealth: Payer: Self-pay | Admitting: Pharmacist

## 2019-08-18 ENCOUNTER — Other Ambulatory Visit: Payer: Self-pay | Admitting: Pulmonary Disease

## 2019-08-18 ENCOUNTER — Telehealth: Payer: Self-pay

## 2019-08-18 ENCOUNTER — Encounter: Payer: Self-pay | Admitting: Pharmacist

## 2019-08-18 VITALS — Ht 65.0 in | Wt 129.0 lb

## 2019-08-18 DIAGNOSIS — I441 Atrioventricular block, second degree: Secondary | ICD-10-CM

## 2019-08-18 DIAGNOSIS — I10 Essential (primary) hypertension: Secondary | ICD-10-CM | POA: Diagnosis not present

## 2019-08-18 DIAGNOSIS — Z950 Presence of cardiac pacemaker: Secondary | ICD-10-CM

## 2019-08-18 HISTORY — DX: Presence of cardiac pacemaker: Z95.0

## 2019-08-18 MED ORDER — TECHNETIUM TC 99M TETROFOSMIN IV KIT
9.8000 | PACK | Freq: Once | INTRAVENOUS | Status: AC | PRN
  Administered 2019-08-14: 9.8 via INTRAVENOUS

## 2019-08-18 NOTE — Telephone Encounter (Signed)
Spoke with pt's daughter, DPR and advised appointment for pacemaker implant has been scheduled per Dr Rayann Heman for 08/21/2019.  See instruction letter.  Pt's daughter verbalizes understanding of all instructions and appointments given and states she will pick up instruction letter and surgical scrub on 08/20/2019.  Pt's daughter agrees with current plan.
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
## 2019-08-18 NOTE — Telephone Encounter (Signed)
-----   Message from [name], MD sent at 08/18/2019  9:00 AM EST ----- Please schedule pacemaker imiplant for this Thursday.  She has echo scheduled on wedndesday already.  Maybe she could get labs and covid tested on wed and then have procedure Thursday.
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
## 2019-08-18 NOTE — Telephone Encounter (Signed)
Called patient on 08/18/2019 at 1:27 PM   Patient states she was able to quit smoking successfully by quitting "cold [place]". Congratulated patient for her efforts!!! Advised patient to follow up with pharmacy team if she begins to have urges to smoke again. Patient verbalized understanding.   Thank you for involving pharmacy to assist in providing this patient's care.   Drexel Iha, PharmD PGY2 Ambulatory Care Pharmacy Resident

## 2019-08-18 NOTE — Addendum Note (Signed)
Addended by: Thora Lance on: 08/18/2019 10:29 AM   Modules accepted: Orders

## 2019-08-18 NOTE — Telephone Encounter (Signed)
Brandi Raymond! Great news! Aaron Edelman

## 2019-08-20 ENCOUNTER — Other Ambulatory Visit: Payer: Self-pay

## 2019-08-20 ENCOUNTER — Other Ambulatory Visit: Payer: PPO | Admitting: *Deleted

## 2019-08-20 ENCOUNTER — Other Ambulatory Visit (HOSPITAL_COMMUNITY): Payer: PPO

## 2019-08-20 ENCOUNTER — Other Ambulatory Visit (HOSPITAL_COMMUNITY)
Admission: RE | Admit: 2019-08-20 | Discharge: 2019-08-20 | Disposition: A | Discharge status: Home / Self Care | Payer: PPO | Source: Ambulatory Visit | Attending: Internal Medicine | Admitting: Internal Medicine

## 2019-08-20 ENCOUNTER — Ambulatory Visit (HOSPITAL_BASED_OUTPATIENT_CLINIC_OR_DEPARTMENT_OTHER): Payer: PPO

## 2019-08-20 DIAGNOSIS — I441 Atrioventricular block, second degree: Secondary | ICD-10-CM | POA: Diagnosis not present

## 2019-08-20 DIAGNOSIS — I2510 Atherosclerotic heart disease of native coronary artery without angina pectoris: Secondary | ICD-10-CM | POA: Diagnosis not present

## 2019-08-20 DIAGNOSIS — I252 Old myocardial infarction: Secondary | ICD-10-CM | POA: Diagnosis not present

## 2019-08-20 DIAGNOSIS — I081 Rheumatic disorders of both mitral and tricuspid valves: Secondary | ICD-10-CM | POA: Insufficient documentation

## 2019-08-20 DIAGNOSIS — Z85118 Personal history of other malignant neoplasm of bronchus and lung: Secondary | ICD-10-CM | POA: Diagnosis not present

## 2019-08-20 DIAGNOSIS — J449 Chronic obstructive pulmonary disease, unspecified: Secondary | ICD-10-CM | POA: Insufficient documentation

## 2019-08-20 DIAGNOSIS — I10 Essential (primary) hypertension: Secondary | ICD-10-CM | POA: Diagnosis not present

## 2019-08-20 DIAGNOSIS — F17200 Nicotine dependence, unspecified, uncomplicated: Secondary | ICD-10-CM | POA: Insufficient documentation

## 2019-08-20 DIAGNOSIS — Z01812 Encounter for preprocedural laboratory examination: Secondary | ICD-10-CM | POA: Diagnosis not present

## 2019-08-20 DIAGNOSIS — Z20822 Contact with and (suspected) exposure to covid-19: Secondary | ICD-10-CM | POA: Diagnosis not present

## 2019-08-20 DIAGNOSIS — E782 Mixed hyperlipidemia: Secondary | ICD-10-CM | POA: Insufficient documentation

## 2019-08-20 DIAGNOSIS — R072 Precordial pain: Secondary | ICD-10-CM | POA: Diagnosis not present

## 2019-08-20 LAB — BASIC METABOLIC PANEL
BUN/Creatinine Ratio: 20 (ref 12–28)
BUN: 19 mg/dL (ref 8–27)
CO2: 25 mmol/L (ref 20–29)
Calcium: 7.8 mg/dL — ABNORMAL LOW (ref 8.7–10.3)
Chloride: 107 mmol/L — ABNORMAL HIGH (ref 96–106)
Creatinine, Ser: 0.94 mg/dL (ref 0.57–1.00)
GFR calc Af Amer: 64 mL/min/{1.73_m2} (ref >59)
GFR calc non Af Amer: 56 mL/min/{1.73_m2} — ABNORMAL LOW (ref >59)
Glucose: 73 mg/dL (ref 65–99)
Potassium: 4.4 mmol/L (ref 3.5–5.2)
Sodium: 142 mmol/L (ref 134–144)

## 2019-08-20 LAB — CBC WITH DIFFERENTIAL/PLATELET
Basophils Absolute: 0.1 10*3/uL (ref 0.0–0.2)
Basos: 1 %
EOS (ABSOLUTE): 0.3 10*3/uL (ref 0.0–0.4)
Eos: 4 %
Hematocrit: 35.6 % (ref 34.0–46.6)
Hemoglobin: 11.7 g/dL (ref 11.1–15.9)
Immature Grans (Abs): 0 10*3/uL (ref 0.0–0.1)
Immature Granulocytes: 0 %
Lymphocytes Absolute: 1.6 10*3/uL (ref 0.7–3.1)
Lymphs: 23 %
MCH: 28.2 pg (ref 26.6–33.0)
MCHC: 32.9 g/dL (ref 31.5–35.7)
MCV: 86 fL (ref 79–97)
Monocytes Absolute: 0.9 10*3/uL (ref 0.1–0.9)
Monocytes: 13 %
Neutrophils Absolute: 4.2 10*3/uL (ref 1.4–7.0)
Neutrophils: 59 %
Platelets: 148 10*3/uL — ABNORMAL LOW (ref 150–450)
RBC: 4.15 x10E6/uL (ref 3.77–5.28)
RDW: 12.7 % (ref 11.7–15.4)
WBC: 7 10*3/uL (ref 3.4–10.8)

## 2019-08-20 LAB — SARS CORONAVIRUS 2 (TAT 6-24 HRS): SARS Coronavirus 2: NEGATIVE

## 2019-08-21 ENCOUNTER — Telehealth: Payer: Self-pay | Admitting: Medical Oncology

## 2019-08-21 ENCOUNTER — Other Ambulatory Visit: Payer: Self-pay

## 2019-08-21 ENCOUNTER — Ambulatory Visit (HOSPITAL_COMMUNITY)
Admission: RE | Disposition: A | Discharge status: Home / Self Care | Payer: Self-pay | Source: Home / Self Care | Attending: Internal Medicine

## 2019-08-21 ENCOUNTER — Ambulatory Visit (HOSPITAL_COMMUNITY)
Admission: RE | Admit: 2019-08-21 | Discharge: 2019-08-22 | Disposition: A | Discharge status: Home / Self Care | Payer: PPO | Attending: Internal Medicine | Admitting: Internal Medicine

## 2019-08-21 DIAGNOSIS — Z79890 Hormone replacement therapy (postmenopausal): Secondary | ICD-10-CM | POA: Insufficient documentation

## 2019-08-21 DIAGNOSIS — I441 Atrioventricular block, second degree: Secondary | ICD-10-CM | POA: Diagnosis not present

## 2019-08-21 DIAGNOSIS — K219 Gastro-esophageal reflux disease without esophagitis: Secondary | ICD-10-CM | POA: Diagnosis not present

## 2019-08-21 DIAGNOSIS — R001 Bradycardia, unspecified: Secondary | ICD-10-CM | POA: Insufficient documentation

## 2019-08-21 DIAGNOSIS — Z79899 Other long term (current) drug therapy: Secondary | ICD-10-CM | POA: Insufficient documentation

## 2019-08-21 DIAGNOSIS — E039 Hypothyroidism, unspecified: Secondary | ICD-10-CM | POA: Diagnosis not present

## 2019-08-21 DIAGNOSIS — F329 Major depressive disorder, single episode, unspecified: Secondary | ICD-10-CM | POA: Insufficient documentation

## 2019-08-21 DIAGNOSIS — G7000 Myasthenia gravis without (acute) exacerbation: Secondary | ICD-10-CM | POA: Diagnosis not present

## 2019-08-21 DIAGNOSIS — Z7902 Long term (current) use of antithrombotics/antiplatelets: Secondary | ICD-10-CM | POA: Diagnosis not present

## 2019-08-21 DIAGNOSIS — E785 Hyperlipidemia, unspecified: Secondary | ICD-10-CM | POA: Diagnosis not present

## 2019-08-21 DIAGNOSIS — Z85118 Personal history of other malignant neoplasm of bronchus and lung: Secondary | ICD-10-CM | POA: Diagnosis not present

## 2019-08-21 DIAGNOSIS — Z7982 Long term (current) use of aspirin: Secondary | ICD-10-CM | POA: Insufficient documentation

## 2019-08-21 DIAGNOSIS — F17210 Nicotine dependence, cigarettes, uncomplicated: Secondary | ICD-10-CM | POA: Diagnosis not present

## 2019-08-21 DIAGNOSIS — J449 Chronic obstructive pulmonary disease, unspecified: Secondary | ICD-10-CM | POA: Diagnosis not present

## 2019-08-21 DIAGNOSIS — I2510 Atherosclerotic heart disease of native coronary artery without angina pectoris: Secondary | ICD-10-CM | POA: Insufficient documentation

## 2019-08-21 DIAGNOSIS — Z959 Presence of cardiac and vascular implant and graft, unspecified: Secondary | ICD-10-CM

## 2019-08-21 DIAGNOSIS — I252 Old myocardial infarction: Secondary | ICD-10-CM | POA: Diagnosis not present

## 2019-08-21 DIAGNOSIS — I10 Essential (primary) hypertension: Secondary | ICD-10-CM | POA: Diagnosis not present

## 2019-08-21 HISTORY — PX: PACEMAKER IMPLANT: EP1218

## 2019-08-21 SURGERY — PACEMAKER IMPLANT

## 2019-08-21 MED ORDER — SODIUM CHLORIDE 0.9 % IV SOLN: INTRAVENOUS | Status: DC

## 2019-08-21 MED ORDER — ONDANSETRON HCL 4 MG/2ML IJ SOLN: 4.0000 mg | Freq: Four times a day (QID) | INTRAMUSCULAR | Status: DC | PRN

## 2019-08-21 MED ORDER — SODIUM CHLORIDE 0.9 % IV SOLN: 250.0000 mL | INTRAVENOUS | Status: DC | PRN

## 2019-08-21 MED ORDER — SODIUM CHLORIDE 0.9 % IV SOLN
INTRAVENOUS | Status: AC
  Filled 2019-08-21: qty 2

## 2019-08-21 MED ORDER — SODIUM CHLORIDE 0.9% FLUSH
3.0000 mL | Freq: Two times a day (BID) | INTRAVENOUS | Status: DC
  Administered 2019-08-22: 09:00:00 3 mL via INTRAVENOUS

## 2019-08-21 MED ORDER — LIDOCAINE HCL (PF) 1 % IJ SOLN
INTRAMUSCULAR | Status: DC | PRN
  Administered 2019-08-21: 60 mL

## 2019-08-21 MED ORDER — MIDAZOLAM HCL 5 MG/5ML IJ SOLN
INTRAMUSCULAR | Status: AC
  Filled 2019-08-21: qty 5

## 2019-08-21 MED ORDER — ACETAMINOPHEN 325 MG PO TABS
325.0000 mg | ORAL_TABLET | ORAL | Status: DC | PRN
  Administered 2019-08-21: 20:00:00 650 mg via ORAL
  Filled 2019-08-21: qty 2

## 2019-08-21 MED ORDER — DONEPEZIL HCL 10 MG PO TABS
10.0000 mg | ORAL_TABLET | Freq: Every day | ORAL | Status: DC
  Administered 2019-08-21: 10 mg via ORAL
  Filled 2019-08-21: qty 1

## 2019-08-21 MED ORDER — LIDOCAINE HCL 1 % IJ SOLN
INTRAMUSCULAR | Status: AC
  Filled 2019-08-21: qty 60

## 2019-08-21 MED ORDER — DULOXETINE HCL 60 MG PO CPEP
60.0000 mg | ORAL_CAPSULE | Freq: Every day | ORAL | Status: DC
  Administered 2019-08-21 – 2019-08-22 (×2): 60 mg via ORAL
  Filled 2019-08-21 (×2): qty 1

## 2019-08-21 MED ORDER — FENTANYL CITRATE (PF) 100 MCG/2ML IJ SOLN
INTRAMUSCULAR | Status: AC
  Filled 2019-08-21: qty 2

## 2019-08-21 MED ORDER — LISINOPRIL 10 MG PO TABS
10.0000 mg | ORAL_TABLET | Freq: Every day | ORAL | Status: DC
  Administered 2019-08-22: 09:00:00 10 mg via ORAL
  Filled 2019-08-21: qty 1

## 2019-08-21 MED ORDER — UMECLIDINIUM-VILANTEROL 62.5-25 MCG/INH IN AEPB
1.0000 | INHALATION_SPRAY | Freq: Every day | RESPIRATORY_TRACT | Status: DC
  Administered 2019-08-21: 20:00:00 1 via RESPIRATORY_TRACT
  Filled 2019-08-21: qty 14

## 2019-08-21 MED ORDER — HEPARIN (PORCINE) IN NACL 1000-0.9 UT/500ML-% IV SOLN
INTRAVENOUS | Status: AC
  Filled 2019-08-21: qty 500

## 2019-08-21 MED ORDER — CEFAZOLIN SODIUM-DEXTROSE 1-4 GM/50ML-% IV SOLN
1.0000 g | Freq: Four times a day (QID) | INTRAVENOUS | Status: AC
  Administered 2019-08-21 – 2019-08-22 (×3): 1 g via INTRAVENOUS
  Filled 2019-08-21 (×3): qty 50

## 2019-08-21 MED ORDER — ONDANSETRON HCL 4 MG/2ML IJ SOLN
INTRAMUSCULAR | Status: AC
  Filled 2019-08-21: qty 2

## 2019-08-21 MED ORDER — FAMOTIDINE 20 MG PO TABS
20.0000 mg | ORAL_TABLET | Freq: Every day | ORAL | Status: DC
  Administered 2019-08-21: 22:00:00 20 mg via ORAL
  Filled 2019-08-21: qty 1

## 2019-08-21 MED ORDER — GABAPENTIN 300 MG PO CAPS
300.0000 mg | ORAL_CAPSULE | Freq: Two times a day (BID) | ORAL | Status: DC
  Administered 2019-08-21 – 2019-08-22 (×2): 300 mg via ORAL
  Filled 2019-08-21 (×2): qty 1

## 2019-08-21 MED ORDER — CEFAZOLIN SODIUM-DEXTROSE 2-4 GM/100ML-% IV SOLN
INTRAVENOUS | Status: AC
  Filled 2019-08-21: qty 100

## 2019-08-21 MED ORDER — SODIUM CHLORIDE 0.9 % IV SOLN
80.0000 mg | INTRAVENOUS | Status: AC
  Administered 2019-08-21: 14:00:00 80 mg

## 2019-08-21 MED ORDER — CEFAZOLIN SODIUM-DEXTROSE 2-4 GM/100ML-% IV SOLN
2.0000 g | INTRAVENOUS | Status: AC
  Administered 2019-08-21: 13:00:00 2 g via INTRAVENOUS

## 2019-08-21 MED ORDER — PANTOPRAZOLE SODIUM 40 MG PO TBEC
40.0000 mg | DELAYED_RELEASE_TABLET | Freq: Every day | ORAL | Status: DC
  Administered 2019-08-21 – 2019-08-22 (×2): 40 mg via ORAL
  Filled 2019-08-21 (×2): qty 1

## 2019-08-21 MED ORDER — LEVOTHYROXINE SODIUM 25 MCG PO TABS
125.0000 ug | ORAL_TABLET | Freq: Every day | ORAL | Status: DC
  Administered 2019-08-22: 06:00:00 125 ug via ORAL
  Filled 2019-08-21: qty 1

## 2019-08-21 MED ORDER — SODIUM CHLORIDE 0.9% FLUSH: 3.0000 mL | INTRAVENOUS | Status: DC | PRN

## 2019-08-21 MED ORDER — HYDROCODONE-ACETAMINOPHEN 5-325 MG PO TABS: 1.0000 | ORAL_TABLET | ORAL | Status: DC | PRN

## 2019-08-21 MED ORDER — GABAPENTIN 300 MG PO CAPS
600.0000 mg | ORAL_CAPSULE | Freq: Every day | ORAL | Status: DC
  Administered 2019-08-21: 22:00:00 600 mg via ORAL
  Filled 2019-08-21: qty 2

## 2019-08-21 MED ORDER — CHLORHEXIDINE GLUCONATE 4 % EX LIQD
4.0000 "application " | Freq: Once | CUTANEOUS | Status: DC
  Filled 2019-08-21: qty 60

## 2019-08-21 MED ORDER — IOHEXOL 350 MG/ML SOLN
INTRAVENOUS | Status: DC | PRN
  Administered 2019-08-21: 15 mL via INTRAVENOUS

## 2019-08-21 MED ORDER — CLONAZEPAM 0.5 MG PO TABS
0.5000 mg | ORAL_TABLET | Freq: Every day | ORAL | Status: DC
  Administered 2019-08-21: 22:00:00 0.5 mg via ORAL
  Filled 2019-08-21: qty 1

## 2019-08-21 SURGICAL SUPPLY — 9 items
CABLE SURGICAL S-101-97-12 (CABLE) ×2 IMPLANT
GUIDEWIRE ANGLED .035X150CM (WIRE) ×2 IMPLANT
LEAD TENDRIL MRI 46CM LPA1200M (Lead) ×2 IMPLANT
LEAD TENDRIL MRI 58CM LPA1200M (Lead) ×2 IMPLANT
PACEMAKER ASSURITY DR-RF (Pacemaker) ×2 IMPLANT
PAD PRO RADIOLUCENT 2001M-C (PAD) ×2 IMPLANT
SHEATH 8FR PRELUDE SNAP 13 (SHEATH) ×4 IMPLANT
SHEATH PINNACLE 6F 10CM (SHEATH) ×2 IMPLANT
TRAY PACEMAKER INSERTION (PACKS) ×2 IMPLANT

## 2019-08-21 NOTE — Telephone Encounter (Signed)
I did not order the scan mentioned above.  She should get the results from the ordering physician but on reviewing the scan I do not see anything concerning for cancer recurrence.  Thank you.

## 2019-08-21 NOTE — Telephone Encounter (Signed)
F/u Ct scan-Dtr has concerns about recent  CT results and today pt  coughed up blood on the way to the hospital to get a pacemaker .  She said Dr Quay Burow did not call them about results of scan ,dtr instead called with [facility] to review scan. Pt was called per Dr Julien Nordmann 's review .    Dtr upset that pt was called about this instead of calling  [name]. "You know my mom does not do well with her information." I gave her the information that I told pt . I  told her I was unaware that we should not call her mother.     Dr Quay Burow sent pt message on 1/23 re CT results.

## 2019-08-21 NOTE — Interval H&P Note (Signed)
History and Physical Interval Note:  08/21/2019 12:47 PM  Brandi Raymond  has presented today for surgery, with the diagnosis of av block.  The various methods of treatment have been discussed with the patient and family. After consideration of risks, benefits and other options for treatment, the patient has consented to  Procedure(s): PACEMAKER IMPLANT (N/A) as a surgical intervention.  The patient's history has been reviewed, patient examined, no change in status, stable for surgery.  I have reviewed the patient's chart and labs.  Questions were answered to the patient's satisfaction.     Brandi Raymond

## 2019-08-22 ENCOUNTER — Ambulatory Visit (HOSPITAL_COMMUNITY): Payer: PPO

## 2019-08-22 DIAGNOSIS — I10 Essential (primary) hypertension: Secondary | ICD-10-CM | POA: Diagnosis not present

## 2019-08-22 DIAGNOSIS — J90 Pleural effusion, not elsewhere classified: Secondary | ICD-10-CM | POA: Diagnosis not present

## 2019-08-22 DIAGNOSIS — G7000 Myasthenia gravis without (acute) exacerbation: Secondary | ICD-10-CM | POA: Diagnosis not present

## 2019-08-22 DIAGNOSIS — K219 Gastro-esophageal reflux disease without esophagitis: Secondary | ICD-10-CM | POA: Diagnosis not present

## 2019-08-22 DIAGNOSIS — J449 Chronic obstructive pulmonary disease, unspecified: Secondary | ICD-10-CM | POA: Diagnosis not present

## 2019-08-22 DIAGNOSIS — E785 Hyperlipidemia, unspecified: Secondary | ICD-10-CM | POA: Diagnosis not present

## 2019-08-22 DIAGNOSIS — I2510 Atherosclerotic heart disease of native coronary artery without angina pectoris: Secondary | ICD-10-CM | POA: Diagnosis not present

## 2019-08-22 DIAGNOSIS — E039 Hypothyroidism, unspecified: Secondary | ICD-10-CM | POA: Diagnosis not present

## 2019-08-22 DIAGNOSIS — Z85118 Personal history of other malignant neoplasm of bronchus and lung: Secondary | ICD-10-CM | POA: Diagnosis not present

## 2019-08-22 DIAGNOSIS — I441 Atrioventricular block, second degree: Secondary | ICD-10-CM

## 2019-08-22 DIAGNOSIS — I252 Old myocardial infarction: Secondary | ICD-10-CM | POA: Diagnosis not present

## 2019-08-22 DIAGNOSIS — F329 Major depressive disorder, single episode, unspecified: Secondary | ICD-10-CM | POA: Diagnosis not present

## 2019-08-22 DIAGNOSIS — R001 Bradycardia, unspecified: Secondary | ICD-10-CM | POA: Diagnosis not present

## 2019-08-22 LAB — BASIC METABOLIC PANEL
Anion gap: 9 (ref 5–15)
BUN: 20 mg/dL (ref 8–23)
CO2: 25 mmol/L (ref 22–32)
Calcium: 7.7 mg/dL — ABNORMAL LOW (ref 8.9–10.3)
Chloride: 107 mmol/L (ref 98–111)
Creatinine, Ser: 0.97 mg/dL (ref 0.44–1.00)
GFR calc Af Amer: >60 mL/min (ref >60)
GFR calc non Af Amer: 54 mL/min — ABNORMAL LOW (ref >60)
Glucose, Bld: 106 mg/dL — ABNORMAL HIGH (ref 70–99)
Potassium: 4.5 mmol/L (ref 3.5–5.1)
Sodium: 141 mmol/L (ref 135–145)

## 2019-08-22 MED FILL — Lidocaine HCl Local Inj 1%: INTRAMUSCULAR | Qty: 40 | Status: AC

## 2019-08-22 MED FILL — Midazolam HCl Inj 5 MG/5ML (Base Equivalent): INTRAMUSCULAR | Qty: 5 | Status: AC

## 2019-08-22 MED FILL — Ondansetron HCl Inj 4 MG/2ML (2 MG/ML): INTRAMUSCULAR | Qty: 2 | Status: AC

## 2019-08-22 MED FILL — Fentanyl Citrate Preservative Free (PF) Inj 100 MCG/2ML: INTRAMUSCULAR | Qty: 2 | Status: AC

## 2019-08-22 MED FILL — Heparin Sod (Porcine)-NaCl IV Soln 1000 Unit/500ML-0.9%: INTRAVENOUS | Qty: 500 | Status: AC
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
## 2019-08-22 NOTE — Discharge Instructions (Signed)
    Supplemental Discharge Instructions for  Pacemaker Patients  Activity No heavy lifting or vigorous activity with your left arm for 6 to 8 weeks.  Do not raise your left arm above your head for one week.  Gradually raise your affected arm as drawn below.             08/25/2019                    08/26/2019                 08/27/2019              08/28/2019 __  NO DRIVING the patient does not drive.  WOUND CARE - Keep the wound area clean and dry.  Do not get this area we, no showers until cleared to at your wound check appointment - The tape/steri-strips on your wound will fall off; do not pull them off.  No bandage is needed on the site.  DO  NOT apply any creams, oils, or ointments to the wound area. - If you notice any drainage or discharge from the wound, any swelling or bruising at the site, or you develop a fever > 101? F after you are discharged home, call the office at once.  Special Instructions - You are still able to use cellular telephones; use the ear opposite the side where you have your pacemaker/defibrillator.  Avoid carrying your cellular phone near your device. - When traveling through airports, show security personnel your identification card to avoid being screened in the metal detectors.  Ask the security personnel to use the hand wand. - Avoid arc welding equipment, MRI testing (magnetic resonance imaging), TENS units (transcutaneous nerve stimulators).  Call the office for questions about other devices. - Avoid electrical appliances that are in poor condition or are not properly grounded. - Microwave ovens are safe to be near or to operate.

## 2019-08-25 ENCOUNTER — Telehealth: Payer: Self-pay | Admitting: Medical Oncology

## 2019-08-25 NOTE — Telephone Encounter (Signed)
Unable to reach dtr.

## 2019-08-26 ENCOUNTER — Telehealth: Payer: Self-pay

## 2019-08-26 ENCOUNTER — Other Ambulatory Visit: Payer: Self-pay | Admitting: Neurology

## 2019-08-26 NOTE — Telephone Encounter (Signed)
Transmission received for remote activation s/p new PM implant 2/4.  Noted new onset of AF with 21% burden, longest episode of 10 hours.  No documented history of PAF, pt is on ASA 81mg  and Plavix 75mg  daily, no OAC.    Spoke with pt daughter [name] who is on [place].  She denies that pt has ever been diagnosed with AF.  Reports pt has not had any new symptoms since PM implant.  She is reporting tenderness at implant site and dizziness upon standing however daughter indicates that this was pt baseline before implantation.    Advised would forward to MD for review and recommendations.  Wound check/ device clinic appt changed to avail appointment with EP APP on 2/18.

## 2019-08-27 NOTE — Telephone Encounter (Signed)
Spoke with pt daughter [name].  Advised of appt change on 2/18 to see Oda Kilts, PA for device interrogation/ wound check.

## 2019-08-27 NOTE — Telephone Encounter (Signed)
Follow Up   Patient's daughter is following up. Please give a call back.

## 2019-08-28 ENCOUNTER — Ambulatory Visit: Payer: PPO | Admitting: Adult Health

## 2019-08-29 ENCOUNTER — Encounter: Payer: Self-pay | Admitting: Internal Medicine

## 2019-08-31 IMAGING — CT NUCLEAR MEDICINE PET IMAGE INITIAL (PI) SKULL BASE TO THIGH
1 series · 4 of 4 positions shown · non-contrast
Comparison: Chest CT 09/03/2018

CLINICAL DATA: Initial treatment strategy for pulmonary nodule.

EXAM:
NUCLEAR MEDICINE PET SKULL BASE TO THIGH
TECHNIQUE: 6.9 mCi F-18 FDG was injected intravenously. Full-ring PET imaging
was performed from the skull base to thigh after the radiotracer. CT
data was obtained and used for attenuation correction and anatomic
localization.
Fasting blood glucose: 91 mg/dl

[Series 1080: results mm oncology reading · 4.0mm · 1.00mm/px · 4 of 4 slices shown]
[im 1/4]
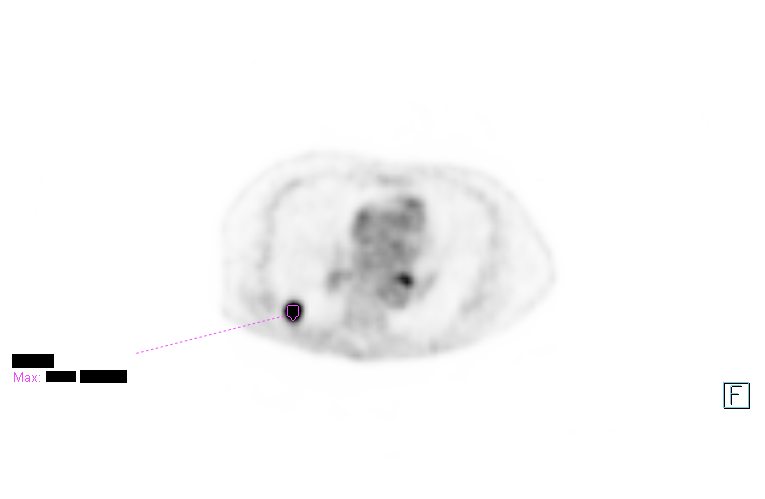
[im 2/4]
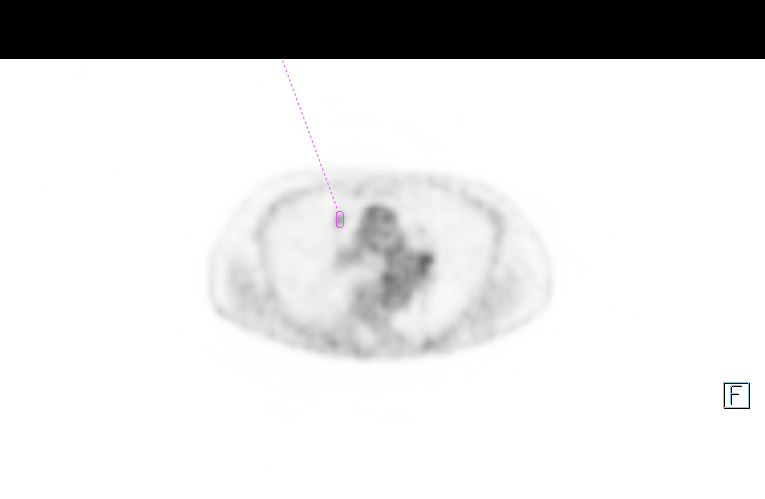
[im 3/4]
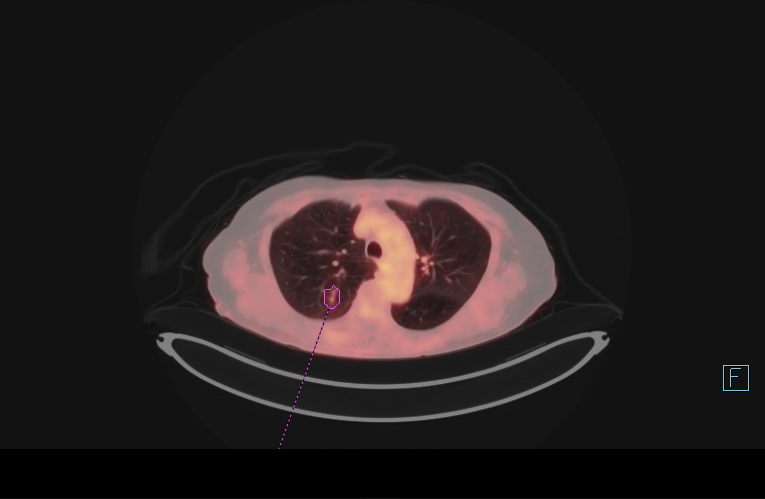
[im 4/4]
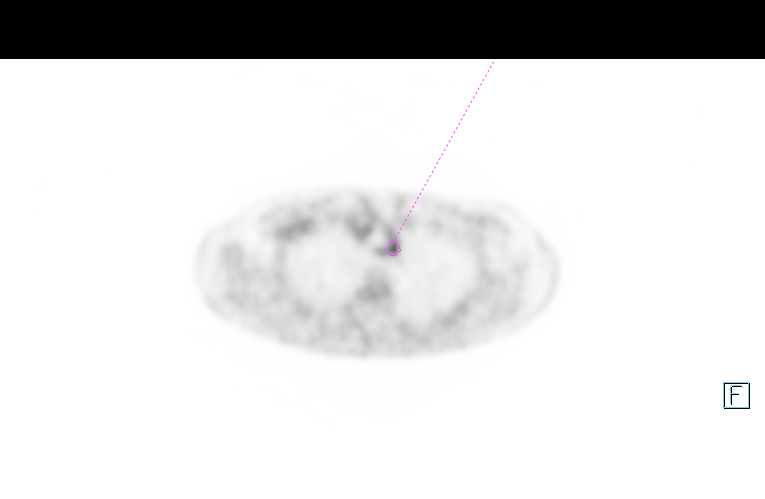

[4 of 4 positions shown; findings below may reference images not displayed]

FINDINGS: Mediastinal blood pool activity: SUV max

NECK: No hypermetabolic lymph nodes in the neck.

Incidental CT findings: none

CHEST: Posterior right lower lobe nodule measures 2.0 cm on today's
exam and is markedly hypermetabolic with SUV max = 12.

7 mm nodule in the medial right upper lobe (68/4) shows FDG
accumulation with SUV max =

Ill-defined opacity posterior right upper lobe measuring about 8 mm
(63/4) demonstrates SUV max = 1.3.

Ill-defined 7 mm nodular opacity in the medial left upper lobe
(54/4) demonstrates SUV max = 3.6.

No evidence for hypermetabolic mediastinal or hilar lymphadenopathy.

Diffuse FDG uptake in the mid and distal esophagus evident without
esophageal wall thickening by CT.

Incidental CT findings: Coronary artery calcification is evident.
Atherosclerotic calcification is noted in the wall of the thoracic
aorta. Centrilobular emphysema noted. Moderate hiatal hernia.

ABDOMEN/PELVIS: No abnormal hypermetabolic activity within the
liver, pancreas, adrenal glands, or spleen. No hypermetabolic lymph
nodes in the abdomen or pelvis.

Incidental CT findings: Tiny calcified gallstones evident. There is
abdominal aortic atherosclerosis without aneurysm. Left colonic
diverticulosis without diverticulitis.

SKELETON: No focal hypermetabolic activity to suggest skeletal
metastasis.

Incidental CT findings: The compression fracture seen previously at
L1 has been progressive in the interval in shows FDG uptake, likely
reactive although metastatic disease not excluded. Mild compression
deformity at L3 appears stable without hypermetabolism.
IMPRESSION: 1. Dominant 2 cm right lower lobe pulmonary nodule is markedly
hypermetabolic consistent with primary bronchogenic neoplasm.
2. Scattered bilateral 7-8 mm ill-defined lung nodules show low
level FDG uptake. Given discernible FDG uptake in nodules of this
size, concern for metastatic disease and/or synchronous primary is
raised.
3. Progression of L1 compression deformity with associated
hypermetabolism. Pathologic fracture not excluded. MRI of the lumbar
spine without with contrast may prove helpful to further evaluate.
4.  Emphysema. (LU8TC-DR4.D)
5.  Aortic Atherosclerois (LU8TC-170.0)
6. Cholelithiasis
7. Colonic diverticulosis.

## 2019-09-01 ENCOUNTER — Other Ambulatory Visit: Payer: Self-pay | Admitting: Internal Medicine

## 2019-09-01 ENCOUNTER — Other Ambulatory Visit: Payer: Self-pay | Admitting: Neurology

## 2019-09-01 NOTE — Telephone Encounter (Signed)
Last OV 07/04/19 Next OV 01/02/20 Last RF 08/01/19

## 2019-09-02 ENCOUNTER — Inpatient Hospital Stay (HOSPITAL_COMMUNITY)
Admission: EM | Admit: 2019-09-02 | Discharge: 2019-09-05 | DRG: 291 | Disposition: A | Discharge status: Home Health | Payer: PPO | Attending: Internal Medicine | Admitting: Internal Medicine

## 2019-09-02 ENCOUNTER — Other Ambulatory Visit: Payer: Self-pay

## 2019-09-02 ENCOUNTER — Emergency Department (HOSPITAL_COMMUNITY): Payer: PPO

## 2019-09-02 ENCOUNTER — Encounter: Payer: PPO | Admitting: Student

## 2019-09-02 ENCOUNTER — Encounter (HOSPITAL_COMMUNITY): Payer: Self-pay

## 2019-09-02 DIAGNOSIS — H3530 Unspecified macular degeneration: Secondary | ICD-10-CM | POA: Diagnosis present

## 2019-09-02 DIAGNOSIS — G7000 Myasthenia gravis without (acute) exacerbation: Secondary | ICD-10-CM | POA: Diagnosis not present

## 2019-09-02 DIAGNOSIS — Z9981 Dependence on supplemental oxygen: Secondary | ICD-10-CM | POA: Diagnosis not present

## 2019-09-02 DIAGNOSIS — Z882 Allergy status to sulfonamides status: Secondary | ICD-10-CM

## 2019-09-02 DIAGNOSIS — E8729 Other acidosis: Secondary | ICD-10-CM

## 2019-09-02 DIAGNOSIS — J189 Pneumonia, unspecified organism: Secondary | ICD-10-CM | POA: Diagnosis not present

## 2019-09-02 DIAGNOSIS — K449 Diaphragmatic hernia without obstruction or gangrene: Secondary | ICD-10-CM | POA: Diagnosis not present

## 2019-09-02 DIAGNOSIS — I480 Paroxysmal atrial fibrillation: Secondary | ICD-10-CM | POA: Insufficient documentation

## 2019-09-02 DIAGNOSIS — Z7982 Long term (current) use of aspirin: Secondary | ICD-10-CM

## 2019-09-02 DIAGNOSIS — J9611 Chronic respiratory failure with hypoxia: Secondary | ICD-10-CM | POA: Diagnosis not present

## 2019-09-02 DIAGNOSIS — R32 Unspecified urinary incontinence: Secondary | ICD-10-CM | POA: Diagnosis not present

## 2019-09-02 DIAGNOSIS — I441 Atrioventricular block, second degree: Secondary | ICD-10-CM | POA: Diagnosis not present

## 2019-09-02 DIAGNOSIS — I447 Left bundle-branch block, unspecified: Secondary | ICD-10-CM | POA: Diagnosis not present

## 2019-09-02 DIAGNOSIS — I708 Atherosclerosis of other arteries: Secondary | ICD-10-CM | POA: Diagnosis present

## 2019-09-02 DIAGNOSIS — Z8349 Family history of other endocrine, nutritional and metabolic diseases: Secondary | ICD-10-CM

## 2019-09-02 DIAGNOSIS — F329 Major depressive disorder, single episode, unspecified: Secondary | ICD-10-CM | POA: Diagnosis present

## 2019-09-02 DIAGNOSIS — E039 Hypothyroidism, unspecified: Secondary | ICD-10-CM | POA: Diagnosis present

## 2019-09-02 DIAGNOSIS — Z923 Personal history of irradiation: Secondary | ICD-10-CM

## 2019-09-02 DIAGNOSIS — I110 Hypertensive heart disease with heart failure: Secondary | ICD-10-CM | POA: Diagnosis not present

## 2019-09-02 DIAGNOSIS — J9601 Acute respiratory failure with hypoxia: Secondary | ICD-10-CM

## 2019-09-02 DIAGNOSIS — I5033 Acute on chronic diastolic (congestive) heart failure: Secondary | ICD-10-CM | POA: Diagnosis present

## 2019-09-02 DIAGNOSIS — Z8249 Family history of ischemic heart disease and other diseases of the circulatory system: Secondary | ICD-10-CM

## 2019-09-02 DIAGNOSIS — J9602 Acute respiratory failure with hypercapnia: Secondary | ICD-10-CM | POA: Diagnosis present

## 2019-09-02 DIAGNOSIS — E785 Hyperlipidemia, unspecified: Secondary | ICD-10-CM | POA: Diagnosis present

## 2019-09-02 DIAGNOSIS — K551 Chronic vascular disorders of intestine: Secondary | ICD-10-CM | POA: Diagnosis present

## 2019-09-02 DIAGNOSIS — I5031 Acute diastolic (congestive) heart failure: Secondary | ICD-10-CM | POA: Diagnosis present

## 2019-09-02 DIAGNOSIS — J9600 Acute respiratory failure, unspecified whether with hypoxia or hypercapnia: Secondary | ICD-10-CM | POA: Diagnosis present

## 2019-09-02 DIAGNOSIS — J80 Acute respiratory distress syndrome: Secondary | ICD-10-CM | POA: Diagnosis not present

## 2019-09-02 DIAGNOSIS — K219 Gastro-esophageal reflux disease without esophagitis: Secondary | ICD-10-CM | POA: Diagnosis not present

## 2019-09-02 DIAGNOSIS — Z950 Presence of cardiac pacemaker: Secondary | ICD-10-CM

## 2019-09-02 DIAGNOSIS — R0789 Other chest pain: Secondary | ICD-10-CM | POA: Diagnosis not present

## 2019-09-02 DIAGNOSIS — J441 Chronic obstructive pulmonary disease with (acute) exacerbation: Secondary | ICD-10-CM | POA: Diagnosis present

## 2019-09-02 DIAGNOSIS — J810 Acute pulmonary edema: Secondary | ICD-10-CM | POA: Diagnosis not present

## 2019-09-02 DIAGNOSIS — Z7902 Long term (current) use of antithrombotics/antiplatelets: Secondary | ICD-10-CM

## 2019-09-02 DIAGNOSIS — F0390 Unspecified dementia without behavioral disturbance: Secondary | ICD-10-CM | POA: Diagnosis present

## 2019-09-02 DIAGNOSIS — I2510 Atherosclerotic heart disease of native coronary artery without angina pectoris: Secondary | ICD-10-CM | POA: Diagnosis present

## 2019-09-02 DIAGNOSIS — Z20822 Contact with and (suspected) exposure to covid-19: Secondary | ICD-10-CM | POA: Diagnosis not present

## 2019-09-02 DIAGNOSIS — E872 Acidosis: Secondary | ICD-10-CM | POA: Diagnosis not present

## 2019-09-02 DIAGNOSIS — Z79890 Hormone replacement therapy (postmenopausal): Secondary | ICD-10-CM

## 2019-09-02 DIAGNOSIS — Z808 Family history of malignant neoplasm of other organs or systems: Secondary | ICD-10-CM

## 2019-09-02 DIAGNOSIS — N179 Acute kidney failure, unspecified: Secondary | ICD-10-CM | POA: Diagnosis present

## 2019-09-02 DIAGNOSIS — K2100 Gastro-esophageal reflux disease with esophagitis, without bleeding: Secondary | ICD-10-CM | POA: Diagnosis not present

## 2019-09-02 DIAGNOSIS — Z87891 Personal history of nicotine dependence: Secondary | ICD-10-CM

## 2019-09-02 DIAGNOSIS — Z85118 Personal history of other malignant neoplasm of bronchus and lung: Secondary | ICD-10-CM

## 2019-09-02 DIAGNOSIS — I252 Old myocardial infarction: Secondary | ICD-10-CM

## 2019-09-02 DIAGNOSIS — R079 Chest pain, unspecified: Secondary | ICD-10-CM | POA: Diagnosis not present

## 2019-09-02 DIAGNOSIS — E782 Mixed hyperlipidemia: Secondary | ICD-10-CM | POA: Diagnosis present

## 2019-09-02 DIAGNOSIS — Z86010 Personal history of colonic polyps: Secondary | ICD-10-CM

## 2019-09-02 DIAGNOSIS — Z833 Family history of diabetes mellitus: Secondary | ICD-10-CM

## 2019-09-02 DIAGNOSIS — Z79899 Other long term (current) drug therapy: Secondary | ICD-10-CM

## 2019-09-02 DIAGNOSIS — R0602 Shortness of breath: Secondary | ICD-10-CM | POA: Diagnosis not present

## 2019-09-02 DIAGNOSIS — Z9181 History of falling: Secondary | ICD-10-CM

## 2019-09-02 DIAGNOSIS — R05 Cough: Secondary | ICD-10-CM | POA: Diagnosis not present

## 2019-09-02 DIAGNOSIS — Z825 Family history of asthma and other chronic lower respiratory diseases: Secondary | ICD-10-CM

## 2019-09-02 DIAGNOSIS — Z90710 Acquired absence of both cervix and uterus: Secondary | ICD-10-CM

## 2019-09-02 LAB — POCT I-STAT 7, (LYTES, BLD GAS, ICA,H+H)
Acid-base deficit: 4 mmol/L — ABNORMAL HIGH (ref 0.0–2.0)
Acid-base deficit: 5 mmol/L — ABNORMAL HIGH (ref 0.0–2.0)
Acid-base deficit: 2 mmol/L (ref 0.0–2.0)
Bicarbonate: 26 mmol/L (ref 20.0–28.0)
Bicarbonate: 23.6 mmol/L (ref 20.0–28.0)
Bicarbonate: 25.3 mmol/L (ref 20.0–28.0)
Calcium, Ion: 1.22 mmol/L (ref 1.15–1.40)
Calcium, Ion: 1.2 mmol/L (ref 1.15–1.40)
Calcium, Ion: 1.16 mmol/L (ref 1.15–1.40)
HCT: 37 % (ref 36.0–46.0)
HCT: 35 % — ABNORMAL LOW (ref 36.0–46.0)
HCT: 35 % — ABNORMAL LOW (ref 36.0–46.0)
Hemoglobin: 12.6 g/dL (ref 12.0–15.0)
Hemoglobin: 11.9 g/dL — ABNORMAL LOW (ref 12.0–15.0)
Hemoglobin: 11.9 g/dL — ABNORMAL LOW (ref 12.0–15.0)
O2 Saturation: 89 %
O2 Saturation: 92 %
O2 Saturation: 94 %
Patient temperature: 97.9
Patient temperature: 98.8
Patient temperature: 97.9
Potassium: 4.4 mmol/L (ref 3.5–5.1)
Potassium: 3.7 mmol/L (ref 3.5–5.1)
Potassium: 3.9 mmol/L (ref 3.5–5.1)
Sodium: 140 mmol/L (ref 135–145)
Sodium: 143 mmol/L (ref 135–145)
Sodium: 143 mmol/L (ref 135–145)
TCO2: 28 mmol/L (ref 22–32)
TCO2: 25 mmol/L (ref 22–32)
TCO2: 27 mmol/L (ref 22–32)
pCO2 arterial: 70.1 mmHg (ref 32.0–48.0)
pCO2 arterial: 59 mmHg — ABNORMAL HIGH (ref 32.0–48.0)
pCO2 arterial: 49.9 mmHg — ABNORMAL HIGH (ref 32.0–48.0)
pH, Arterial: 7.175 — CL (ref 7.350–7.450)
pH, Arterial: 7.212 — ABNORMAL LOW (ref 7.350–7.450)
pH, Arterial: 7.311 — ABNORMAL LOW (ref 7.350–7.450)
pO2, Arterial: 70 mmHg — ABNORMAL LOW (ref 83.0–108.0)
pO2, Arterial: 80 mmHg — ABNORMAL LOW (ref 83.0–108.0)
pO2, Arterial: 76 mmHg — ABNORMAL LOW (ref 83.0–108.0)

## 2019-09-02 LAB — BASIC METABOLIC PANEL
Anion gap: 12 (ref 5–15)
BUN: 18 mg/dL (ref 8–23)
CO2: 26 mmol/L (ref 22–32)
Calcium: 8.3 mg/dL — ABNORMAL LOW (ref 8.9–10.3)
Chloride: 104 mmol/L (ref 98–111)
Creatinine, Ser: 1.41 mg/dL — ABNORMAL HIGH (ref 0.44–1.00)
GFR calc Af Amer: 40 mL/min — ABNORMAL LOW (ref >60)
GFR calc non Af Amer: 34 mL/min — ABNORMAL LOW (ref >60)
Glucose, Bld: 221 mg/dL — ABNORMAL HIGH (ref 70–99)
Potassium: 4.1 mmol/L (ref 3.5–5.1)
Sodium: 142 mmol/L (ref 135–145)

## 2019-09-02 LAB — CBC WITH DIFFERENTIAL/PLATELET
Abs Immature Granulocytes: 0.14 10*3/uL — ABNORMAL HIGH (ref 0.00–0.07)
Basophils Absolute: 0.1 10*3/uL (ref 0.0–0.1)
Basophils Relative: 1 %
Eosinophils Absolute: 0.2 10*3/uL (ref 0.0–0.5)
Eosinophils Relative: 2 %
HCT: 38.8 % (ref 36.0–46.0)
Hemoglobin: 11.3 g/dL — ABNORMAL LOW (ref 12.0–15.0)
Immature Granulocytes: 1 %
Lymphocytes Relative: 23 %
Lymphs Abs: 2.9 10*3/uL (ref 0.7–4.0)
MCH: 27.6 pg (ref 26.0–34.0)
MCHC: 29.1 g/dL — ABNORMAL LOW (ref 30.0–36.0)
MCV: 94.6 fL (ref 80.0–100.0)
Monocytes Absolute: 0.8 10*3/uL (ref 0.1–1.0)
Monocytes Relative: 7 %
Neutro Abs: 8.1 10*3/uL — ABNORMAL HIGH (ref 1.7–7.7)
Neutrophils Relative %: 66 %
Platelets: 316 10*3/uL (ref 150–400)
RBC: 4.1 MIL/uL (ref 3.87–5.11)
RDW: 15.5 % (ref 11.5–15.5)
WBC: 12.2 10*3/uL — ABNORMAL HIGH (ref 4.0–10.5)
nRBC: 0 % (ref 0.0–0.2)

## 2019-09-02 LAB — TROPONIN I (HIGH SENSITIVITY)
Troponin I (High Sensitivity): 28 ng/L — ABNORMAL HIGH (ref <18)
Troponin I (High Sensitivity): 42 ng/L — ABNORMAL HIGH (ref <18)
Troponin I (High Sensitivity): 56 ng/L — ABNORMAL HIGH (ref <18)

## 2019-09-02 LAB — I-STAT CHEM 8, ED
BUN: 24 mg/dL — ABNORMAL HIGH (ref 8–23)
Calcium, Ion: 1.12 mmol/L — ABNORMAL LOW (ref 1.15–1.40)
Chloride: 106 mmol/L (ref 98–111)
Creatinine, Ser: 1.3 mg/dL — ABNORMAL HIGH (ref 0.44–1.00)
Glucose, Bld: 212 mg/dL — ABNORMAL HIGH (ref 70–99)
HCT: 33 % — ABNORMAL LOW (ref 36.0–46.0)
Hemoglobin: 11.2 g/dL — ABNORMAL LOW (ref 12.0–15.0)
Potassium: 4 mmol/L (ref 3.5–5.1)
Sodium: 142 mmol/L (ref 135–145)
TCO2: 28 mmol/L (ref 22–32)

## 2019-09-02 LAB — RESPIRATORY PANEL BY RT PCR (FLU A&B, COVID)
Influenza A by PCR: NEGATIVE
Influenza B by PCR: NEGATIVE
SARS Coronavirus 2 by RT PCR: NEGATIVE

## 2019-09-02 LAB — POC SARS CORONAVIRUS 2 AG -  ED: SARS Coronavirus 2 Ag: NEGATIVE

## 2019-09-02 LAB — BRAIN NATRIURETIC PEPTIDE: B Natriuretic Peptide: 885.2 pg/mL — ABNORMAL HIGH (ref 0.0–100.0)

## 2019-09-02 MED ORDER — ALBUTEROL (5 MG/ML) CONTINUOUS INHALATION SOLN
10.0000 mg/h | INHALATION_SOLUTION | Freq: Once | RESPIRATORY_TRACT | Status: DC
  Filled 2019-09-02: qty 20

## 2019-09-02 MED ORDER — ARFORMOTEROL TARTRATE 15 MCG/2ML IN NEBU
15.0000 ug | INHALATION_SOLUTION | Freq: Two times a day (BID) | RESPIRATORY_TRACT | Status: DC
  Administered 2019-09-02 – 2019-09-05 (×6): 15 ug via RESPIRATORY_TRACT
  Filled 2019-09-02 (×8): qty 2

## 2019-09-02 MED ORDER — DULOXETINE HCL 60 MG PO CPEP
60.0000 mg | ORAL_CAPSULE | Freq: Every day | ORAL | Status: DC
  Administered 2019-09-02 – 2019-09-05 (×4): 60 mg via ORAL
  Filled 2019-09-02 (×4): qty 1

## 2019-09-02 MED ORDER — GABAPENTIN 300 MG PO CAPS
600.0000 mg | ORAL_CAPSULE | Freq: Every day | ORAL | Status: DC
  Administered 2019-09-02 – 2019-09-04 (×3): 600 mg via ORAL
  Filled 2019-09-02 (×3): qty 2

## 2019-09-02 MED ORDER — ACETAMINOPHEN 325 MG PO TABS
650.0000 mg | ORAL_TABLET | ORAL | Status: DC | PRN
  Administered 2019-09-03 – 2019-09-05 (×3): 650 mg via ORAL
  Filled 2019-09-02 (×3): qty 2

## 2019-09-02 MED ORDER — CLOPIDOGREL BISULFATE 75 MG PO TABS
75.0000 mg | ORAL_TABLET | Freq: Every day | ORAL | Status: DC
  Administered 2019-09-02 – 2019-09-05 (×4): 75 mg via ORAL
  Filled 2019-09-02 (×4): qty 1

## 2019-09-02 MED ORDER — FUROSEMIDE 10 MG/ML IJ SOLN
40.0000 mg | Freq: Once | INTRAMUSCULAR | Status: AC
  Administered 2019-09-02: 40 mg via INTRAVENOUS
  Filled 2019-09-02: qty 4

## 2019-09-02 MED ORDER — LEVOTHYROXINE SODIUM 25 MCG PO TABS: 125.0000 ug | ORAL_TABLET | Freq: Every day | ORAL | Status: DC

## 2019-09-02 MED ORDER — HYDRALAZINE HCL 20 MG/ML IJ SOLN: 10.0000 mg | Freq: Four times a day (QID) | INTRAMUSCULAR | Status: DC | PRN

## 2019-09-02 MED ORDER — METHYLPREDNISOLONE SODIUM SUCC 125 MG IJ SOLR: 60.0000 mg | Freq: Four times a day (QID) | INTRAMUSCULAR | Status: DC

## 2019-09-02 MED ORDER — LEVOTHYROXINE SODIUM 25 MCG PO TABS
125.0000 ug | ORAL_TABLET | Freq: Every day | ORAL | Status: DC
  Administered 2019-09-03 – 2019-09-05 (×3): 125 ug via ORAL
  Filled 2019-09-02 (×3): qty 1

## 2019-09-02 MED ORDER — IPRATROPIUM-ALBUTEROL 0.5-2.5 (3) MG/3ML IN SOLN
3.0000 mL | Freq: Four times a day (QID) | RESPIRATORY_TRACT | Status: DC
  Administered 2019-09-02 – 2019-09-03 (×4): 3 mL via RESPIRATORY_TRACT
  Filled 2019-09-02 (×4): qty 3

## 2019-09-02 MED ORDER — SODIUM CHLORIDE 0.9% FLUSH
3.0000 mL | Freq: Two times a day (BID) | INTRAVENOUS | Status: DC
  Administered 2019-09-02 – 2019-09-05 (×6): 3 mL via INTRAVENOUS

## 2019-09-02 MED ORDER — ALBUTEROL SULFATE (2.5 MG/3ML) 0.083% IN NEBU
2.5000 mg | INHALATION_SOLUTION | RESPIRATORY_TRACT | Status: DC | PRN
  Filled 2019-09-02: qty 3

## 2019-09-02 MED ORDER — PANTOPRAZOLE SODIUM 40 MG PO TBEC
40.0000 mg | DELAYED_RELEASE_TABLET | Freq: Every day | ORAL | Status: DC
  Administered 2019-09-02 – 2019-09-05 (×4): 40 mg via ORAL
  Filled 2019-09-02 (×4): qty 1

## 2019-09-02 MED ORDER — ASPIRIN 81 MG PO CHEW
81.0000 mg | CHEWABLE_TABLET | Freq: Every day | ORAL | Status: DC
  Administered 2019-09-02 – 2019-09-05 (×4): 81 mg via ORAL
  Filled 2019-09-02 (×4): qty 1

## 2019-09-02 MED ORDER — APIXABAN 2.5 MG PO TABS
2.5000 mg | ORAL_TABLET | Freq: Two times a day (BID) | ORAL | Status: DC
  Administered 2019-09-02 – 2019-09-05 (×7): 2.5 mg via ORAL
  Filled 2019-09-02 (×8): qty 1

## 2019-09-02 MED ORDER — CLONAZEPAM 0.5 MG PO TABS
0.5000 mg | ORAL_TABLET | Freq: Every day | ORAL | Status: DC
  Administered 2019-09-02 – 2019-09-04 (×3): 0.5 mg via ORAL
  Filled 2019-09-02 (×3): qty 1

## 2019-09-02 MED ORDER — ONDANSETRON HCL 4 MG/2ML IJ SOLN
4.0000 mg | Freq: Four times a day (QID) | INTRAMUSCULAR | Status: DC | PRN
  Administered 2019-09-05: 4 mg via INTRAVENOUS
  Filled 2019-09-02: qty 2

## 2019-09-02 MED ORDER — FUROSEMIDE 10 MG/ML IJ SOLN
40.0000 mg | Freq: Two times a day (BID) | INTRAMUSCULAR | Status: DC
  Administered 2019-09-02 (×2): 40 mg via INTRAVENOUS
  Filled 2019-09-02 (×3): qty 4

## 2019-09-02 MED ORDER — IPRATROPIUM BROMIDE 0.02 % IN SOLN
1.0000 mg | Freq: Once | RESPIRATORY_TRACT | Status: AC
  Administered 2019-09-02: 1 mg via RESPIRATORY_TRACT
  Filled 2019-09-02: qty 5

## 2019-09-02 MED ORDER — DOXYCYCLINE HYCLATE 100 MG PO TABS
100.0000 mg | ORAL_TABLET | Freq: Two times a day (BID) | ORAL | Status: DC
  Administered 2019-09-02 – 2019-09-05 (×7): 100 mg via ORAL
  Filled 2019-09-02 (×7): qty 1

## 2019-09-02 MED ORDER — GABAPENTIN 300 MG PO CAPS: 300.0000 mg | ORAL_CAPSULE | ORAL | Status: DC

## 2019-09-02 MED ORDER — GABAPENTIN 600 MG PO TABS
300.0000 mg | ORAL_TABLET | Freq: Two times a day (BID) | ORAL | Status: DC
  Administered 2019-09-03 – 2019-09-05 (×6): 300 mg via ORAL
  Filled 2019-09-02 (×6): qty 1

## 2019-09-02 MED ORDER — PRAVASTATIN SODIUM 40 MG PO TABS
40.0000 mg | ORAL_TABLET | Freq: Every day | ORAL | Status: DC
  Administered 2019-09-02 – 2019-09-05 (×4): 40 mg via ORAL
  Filled 2019-09-02 (×4): qty 1

## 2019-09-02 MED ORDER — METHYLPREDNISOLONE SODIUM SUCC 125 MG IJ SOLR
60.0000 mg | Freq: Three times a day (TID) | INTRAMUSCULAR | Status: DC
  Administered 2019-09-02 – 2019-09-03 (×3): 60 mg via INTRAVENOUS
  Filled 2019-09-02 (×4): qty 2

## 2019-09-02 MED ORDER — ALBUTEROL SULFATE (2.5 MG/3ML) 0.083% IN NEBU
5.0000 mg | INHALATION_SOLUTION | Freq: Once | RESPIRATORY_TRACT | Status: AC
  Administered 2019-09-02: 05:00:00 5 mg via RESPIRATORY_TRACT
  Filled 2019-09-02: qty 6

## 2019-09-02 MED ORDER — SODIUM CHLORIDE 0.9% FLUSH: 3.0000 mL | INTRAVENOUS | Status: DC | PRN

## 2019-09-02 MED ORDER — DONEPEZIL HCL 10 MG PO TABS
10.0000 mg | ORAL_TABLET | Freq: Every day | ORAL | Status: DC
  Administered 2019-09-02 – 2019-09-04 (×3): 10 mg via ORAL
  Filled 2019-09-02 (×3): qty 1

## 2019-09-02 MED ORDER — BUDESONIDE 0.25 MG/2ML IN SUSP
0.2500 mg | Freq: Two times a day (BID) | RESPIRATORY_TRACT | Status: DC
  Administered 2019-09-02 – 2019-09-05 (×6): 0.25 mg via RESPIRATORY_TRACT
  Filled 2019-09-02 (×8): qty 2

## 2019-09-02 MED ORDER — SODIUM CHLORIDE 0.9 % IV SOLN: 250.0000 mL | INTRAVENOUS | Status: DC | PRN

## 2019-09-02 MED ORDER — ENOXAPARIN SODIUM 30 MG/0.3ML ~~LOC~~ SOLN
30.0000 mg | SUBCUTANEOUS | Status: DC
  Administered 2019-09-02: 11:00:00 30 mg via SUBCUTANEOUS
  Filled 2019-09-02: qty 0.3

## 2019-09-02 NOTE — ED Notes (Signed)
Pt daughter Loise Esguerra [phone] would like a update on pt care
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
## 2019-09-02 NOTE — Progress Notes (Signed)
RT NOTES: Patient removed herself from bipap according to RN and refuses to put it back on. Patient now on 4lpm nasal cannula and tolerating well. Sats 98%, RR 21 with unlabored breathing. Will continue to monitor.

## 2019-09-02 NOTE — ED Triage Notes (Signed)
Patient arrived from home with a chief complaint of shortness of breath that started at 1800. The patient stated she tried to go to bed and it got progressively worse, so she could not sleep. EMS gave albuterol, 2g mag sulfate, and 125 solu-medrol with minimal improvement. Pt had a pacemaker inserted recently and had pneumonia 3 weeks ago. EMS VSS

## 2019-09-02 NOTE — ED Notes (Signed)
Daughter notified regarding patient's condition as requested
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
## 2019-09-02 NOTE — ED Notes (Signed)
Lunch Tray Ordered @ 1056. 

## 2019-09-03 DIAGNOSIS — I480 Paroxysmal atrial fibrillation: Secondary | ICD-10-CM

## 2019-09-03 DIAGNOSIS — J9601 Acute respiratory failure with hypoxia: Secondary | ICD-10-CM

## 2019-09-03 DIAGNOSIS — I5031 Acute diastolic (congestive) heart failure: Secondary | ICD-10-CM

## 2019-09-03 DIAGNOSIS — I441 Atrioventricular block, second degree: Secondary | ICD-10-CM

## 2019-09-03 LAB — BASIC METABOLIC PANEL
Anion gap: 14 (ref 5–15)
BUN: 19 mg/dL (ref 8–23)
CO2: 26 mmol/L (ref 22–32)
Calcium: 7.8 mg/dL — ABNORMAL LOW (ref 8.9–10.3)
Chloride: 100 mmol/L (ref 98–111)
Creatinine, Ser: 1.12 mg/dL — ABNORMAL HIGH (ref 0.44–1.00)
GFR calc Af Amer: 52 mL/min — ABNORMAL LOW (ref >60)
GFR calc non Af Amer: 45 mL/min — ABNORMAL LOW (ref >60)
Glucose, Bld: 123 mg/dL — ABNORMAL HIGH (ref 70–99)
Potassium: 4 mmol/L (ref 3.5–5.1)
Sodium: 140 mmol/L (ref 135–145)

## 2019-09-03 LAB — TSH: TSH: 0.677 u[IU]/mL (ref 0.350–4.500)

## 2019-09-03 MED ORDER — POTASSIUM CHLORIDE CRYS ER 10 MEQ PO TBCR
10.0000 meq | EXTENDED_RELEASE_TABLET | Freq: Every day | ORAL | Status: DC
  Administered 2019-09-03 – 2019-09-05 (×3): 10 meq via ORAL
  Filled 2019-09-03 (×3): qty 1

## 2019-09-03 MED ORDER — METHYLPREDNISOLONE SODIUM SUCC 125 MG IJ SOLR
60.0000 mg | Freq: Every day | INTRAMUSCULAR | Status: DC
  Administered 2019-09-04 – 2019-09-05 (×2): 60 mg via INTRAVENOUS
  Filled 2019-09-03 (×2): qty 2

## 2019-09-03 MED ORDER — FUROSEMIDE 10 MG/ML IJ SOLN: 40.0000 mg | Freq: Every day | INTRAMUSCULAR | Status: DC

## 2019-09-03 NOTE — Progress Notes (Signed)
Transitions of Care Pharmacist Note  Brandi Raymond is a 86 y.o. female that has been diagnosed with A Fib and will be prescribed Eliquis (apixaban) at discharge.   Patient Education: I provided the following education on 09/03/2019 to the patient: How to take the medication Described what the medication is Signs of bleeding Signs/symptoms of VTE and stroke  Answered their questions  Discharge Medications Plan: The patient wants to have their discharge medications filled by the Transitions of Care pharmacy rather than their usual pharmacy.  The discharge orders pharmacy has been changed to the Transitions of Care pharmacy, the patient will receive a phone call regarding co-pay, and their medications will be delivered by the Transitions of Care pharmacy.    Thank you,   Kennon Holter, PharmD PGY1 Ambulatory Care Pharmacy Resident September 03, 2019

## 2019-09-03 NOTE — Evaluation (Signed)
Physical Therapy Evaluation Patient Details Name: Brandi Raymond MRN: [id number] DOB: 02/28/1934 Today's Date: 09/03/2019   History of Present Illness  Pt is an 86 year old woman admitted with SOB requiring bipap in the ED. PMH: COPD on 2L home 02, CAD, HTN, hypothyroid, s/p pacemaker on 08/21/19 due to heart block.  Clinical Impression  Prior to admission, pt uses a Rollator for mobility intermittently and is independent with ADL's. Endorses history of recent falls. Pt currently presents with decreased endurance, balance impairments, and cognitive deficits (likely baseline). Ambulating 200 feet with a walker at a min guard assist level. HR stable at 105 bpm, SpO2 > 90% on 4L O2. Pt presents as high fall risk based on history of falls, decreased safety awareness and decreased gait speed. Would benefit from HHPT follow up.     Follow Up Recommendations Home health PT;Supervision/Assistance - 24 hour    Equipment Recommendations  None recommended by PT    Recommendations for Other Services       Precautions / Restrictions Precautions Precautions: Fall Precaution Comments: daughter reports she has had falls, forgets to use her rollator Restrictions Weight Bearing Restrictions: No      Mobility  Bed Mobility Overal bed mobility: Modified Independent             General bed mobility comments: Sitting up on EOB upon arrival  Transfers Overall transfer level: Needs assistance Equipment used: Rolling walker (2 wheeled) Transfers: Sit to/from [facility] Sit to Stand: Supervision Stand pivot transfers: Min guard       General transfer comment: Supervision as pt slightly impulsive  Ambulation/Gait Ambulation/Gait assistance: Min guard Gait Distance (Feet): 200 Feet Assistive device: Rolling walker (2 wheeled) Gait Pattern/deviations: Step-through pattern;Decreased stride length Gait velocity: decreased   General Gait Details: No overt LOB, cues for  environmental negotiation  Stairs            Wheelchair Mobility    Modified Rankin (Stroke Patients Only)       Balance Overall balance assessment: Needs assistance   Sitting balance-Leahy Scale: Good       Standing balance-Leahy Scale: Fair Standing balance comment: statically                             Pertinent Vitals/Pain Pain Assessment: No/denies pain    Home Living Family/patient expects to be discharged to:: Private residence Living Arrangements: Spouse/significant other Available Help at Discharge: Family;Available 24 hours/day Type of Home: House Home Access: Stairs to enter Entrance Stairs-Rails: Right Entrance Stairs-Number of Steps: 3 Home Layout: One level Home Equipment: Walker - 4 wheels;Bedside commode Additional Comments: uses 3 in 1 as shower seat    Prior Function Level of Independence: Independent with assistive device(s)         Comments: 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     Hand Dominance   Dominant Hand: Right    Extremity/Trunk Assessment   Upper Extremity Assessment Upper Extremity Assessment: Overall WFL for tasks assessed    Lower Extremity Assessment Lower Extremity Assessment: Overall WFL for tasks assessed    Cervical / Trunk Assessment Cervical / Trunk Assessment: Kyphotic  Communication   Communication: No difficulties  Cognition Arousal/Alertness: Awake/alert Behavior During Therapy: WFL for tasks assessed/performed Overall Cognitive Status: Impaired/Different from baseline Area of Impairment: Memory;Safety/judgement                     Memory:  Decreased short-term memory   Safety/Judgement: Decreased awareness of deficits            General Comments      Exercises     Assessment/Plan    PT Assessment Patient needs continued PT services  PT Problem List Decreased strength;Decreased activity tolerance;Decreased balance;Decreased mobility;Decreased  cognition;Decreased safety awareness       PT Treatment Interventions DME instruction;Gait training;Functional mobility training;Therapeutic activities;Therapeutic exercise;Stair training;Balance training;Patient/family education    PT Goals (Current goals can be found in the Care Plan section)  Acute Rehab PT Goals Patient Stated Goal: to go home PT Goal Formulation: With patient/family Time For Goal Achievement: 09/17/19 Potential to Achieve Goals: Good    Frequency Min 3X/week   Barriers to discharge        Co-evaluation               AM-PAC PT "6 Clicks" Mobility  Outcome Measure Help needed turning from your back to your side while in a flat bed without using bedrails?: None Help needed moving from lying on your back to sitting on the side of a flat bed without using bedrails?: None Help needed moving to and from a bed to a chair (including a wheelchair)?: None Help needed standing up from a chair using your arms (e.g., wheelchair or bedside chair)?: None Help needed to walk in hospital room?: A Little Help needed climbing 3-5 steps with a railing? : A Little 6 Click Score: 22    End of Session Equipment Utilized During Treatment: Gait belt;Oxygen Activity Tolerance: Patient tolerated treatment well Patient left: in bed;with call bell/phone within reach;with family/visitor present Nurse Communication: Mobility status PT Visit Diagnosis: Unsteadiness on feet (R26.81);History of falling (Z91.81);Difficulty in walking, not elsewhere classified (R26.2)    Time: 6195-0932 PT Time Calculation (min) (ACUTE ONLY): 25 min   Charges:   PT Evaluation $PT Eval Moderate Complexity: 1 Mod PT Treatments $Therapeutic Activity: 8-22 mins          Wyona Almas, PT, DPT Acute Rehabilitation Services Pager [phone] Office [phone]   Deno Etienne 09/03/2019, 5:23 PM

## 2019-09-03 NOTE — Progress Notes (Signed)
Marland Kitchen  PROGRESS NOTE    Brandi Raymond  NTI:[id number] DOB: 12-25-1933 DOA: 09/02/2019 PCP: Binnie Rail, MD   Brief Narrative:   Patient is a 86 year old female with history of CAD, COPD, GERD, hypertension, hyperlipidemia, hypothyroidism, second-degree AV block with symptomatic bradycardia status post pacemaker placement on 08/21/2019 (Dr. Rayann Heman) presented to ED with acute shortness of breath.  Per chart review, pacemaker check on 2/9 had shown new onset atrial fibrillation, patient not on anticoagulation.  She has an appointment on 2/18 with cardiology for device interrogation.  Patient presented to ED with acute shortness of breath that started overnight.  At the time of my encounter, patient is on BiPAP and could not provide any history.  History was obtained from patient's daughter, [name] on the phone.   Patient's daughter reported that yesterday patient was in her baseline state of health, she received a call from her father earlier this morning that patient became acutely short of breath overnight.  She also stated that patient was having shortness of breath in the last 2 months, she was found to have bradycardia with heart block.  Her symptoms of shortness of breath were thought to be from symptomatic bradycardia hence pacemaker was placed on 08/21/2019.  However, her symptoms still did not improve and they were supposed to follow up with cardiology on 2/18. No fevers or chills however patient was having congestion with productive cough, shortness of breath and wheezing.  She had pneumonia 3 weeks ago. Patient received albuterol, 2 g mag sulfate and 125 mg of Solu-Medrol with minimal improvement.  Patient was placed on BiPAP in ED.  09/03/19: Breathing status is improved, but not back to baseline. Baseline O2 use is 2L at home. She's on 4L here. Continue to wean. Lasix decreased to 40mg  daily. Continue IS and breathing Tx. Spoke with dtr by phone for update.   Assessment & Plan:   Principal  Problem:   Acute respiratory failure (HCC) Active Problems:   Hypothyroidism   HYPERLIPIDEMIA   COPD with acute exacerbation (HCC)   GERD (gastroesophageal reflux disease)   Chronic respiratory failure with hypoxia (HCC)   Second degree Mobitz II AV block   Acute diastolic CHF (congestive heart failure) (HCC)  Acute hypercarbic respiratory failure (HCC) likely secondary to COPD exacerbation and acute diastolic CHF Acute diastolic CHF Acute COPD     - Chest x-ray showed pulmonary edema, new from prior exams, increased pleural effusions, moderate in size. Previous right perihilar opacities obscured by pulmonary edema.      - Elevated BNP.  Echo on 08/20/2019 had shown EF of 50 to 55%, low normal left ventricular function     - Given BID IV lasix and has improved (3.3L down; O2 down to 4L [place])     - cards has eval'd; appreciate assistance, decreased lasix, have s/o'd     - let's decrease steroids from TID to daily; continue breathing Tx, doxy and IS  Acute kidney injury     - likely d/t decreased renal perfusion     - baseline creatinine 0.9.  Presented with creatinine of 1.4     - SCr is 1.12 today; monitor; continue holding ACEi  Hypothyroidism     - synthroid     - TSH is 0.677  Hyperlipidemia     - continue pravastatin  GERD (gastroesophageal reflux disease)     - continue protonix  Second degree Mobitz II AV block Paroxysmal afib CAD     - Recent pacemaker  placement, cardiology has been consulted for follow-up     - pacer interogated; found 21% a fib burden, started on eliquis     - continue ASA, statin   History of falls     - Per daughter, patient ambulates with a walker at baseline however frequently forgets to use it, has falls      - PT/OT consulted  Dementia      - continue Cymbalta, donepezil  DVT prophylaxis: eliquis Code Status: FULL Family Communication: With dtr by phone   Disposition Plan: Needs PT/OT eval. Needs to get back to O2 use baseline.    Consultants:   Cardiology  Antimicrobials:  . Doxy   ROS:  Denies CP, N, V. Reports improved dyspnea . Remainder 10-pt ROS is negative for all not previously mentioned.  Subjective: "They have me in the dungeon!"  Objective: Vitals:   09/03/19 0400 09/03/19 0500 09/03/19 0805 09/03/19 0829  BP: 123/73  107/82   Pulse: 96  83   Resp: 15     Temp: 98.4 F (36.9 C)  97.7 F (36.5 C)   TempSrc: Oral     SpO2: 99%  99% 100%  Weight:  57.6 kg    Height:        Intake/Output Summary (Last 24 hours) at 09/03/2019 1414 Last data filed at 09/03/2019 1127 Gross per 24 hour  Intake --  Output 3550 ml  Net -3550 ml   Filed Weights   09/02/19 0424 09/03/19 0500  Weight: 57 kg 57.6 kg    Examination:  General: 86 y.o. female resting in bed in NAD Cardiovascular: RRR, +S1, S2, no m/g/r, equal pulses throughout Respiratory: decreased at bases, slight exp wheeze, normal WOB, on 4L [place] GI: BS+, NDNT, no masses noted, no organomegaly noted MSK: No e/c/c Skin: No rashes, bruises, ulcerations noted Neuro: alert to name, follows commands   Data Reviewed: I have personally reviewed following labs and imaging studies.  CBC: Recent Labs  Lab 09/02/19 0517 09/02/19 0520 09/02/19 0641 09/02/19 0745 09/02/19 0949  WBC  --  12.2*  --   --   --   NEUTROABS  --  8.1*  --   --   --   HGB 12.6 11.3* 11.2* 11.9* 11.9*  HCT 37.0 38.8 33.0* 35.0* 35.0*  MCV  --  94.6  --   --   --   PLT  --  316  --   --   --    Basic Metabolic Panel: Recent Labs  Lab 09/02/19 0630 09/02/19 0641 09/02/19 0745 09/02/19 0949 09/03/19 0326  NA 142 142 143 143 140  K 4.1 4.0 3.7 3.9 4.0  CL 104 106  --   --  100  CO2 26  --   --   --  26  GLUCOSE 221* 212*  --   --  123*  BUN 18 24*  --   --  19  CREATININE 1.41* 1.30*  --   --  1.12*  CALCIUM 8.3*  --   --   --  7.8*   GFR: Estimated Creatinine Clearance: 32.3 mL/min (A) (by C-G formula based on SCr of 1.12 mg/dL (H)). Liver Function  Tests: No results for input(s): AST, ALT, ALKPHOS, BILITOT, PROT, ALBUMIN in the last 168 hours. No results for input(s): LIPASE, AMYLASE in the last 168 hours. No results for input(s): AMMONIA in the last 168 hours. Coagulation Profile: No results for input(s): INR, PROTIME in the last 168 hours. Cardiac  Enzymes: No results for input(s): CKTOTAL, CKMB, CKMBINDEX, TROPONINI in the last 168 hours. BNP (last 3 results) No results for input(s): PROBNP in the last 8760 hours. HbA1C: No results for input(s): HGBA1C in the last 72 hours. CBG: No results for input(s): GLUCAP in the last 168 hours. Lipid Profile: No results for input(s): CHOL, HDL, LDLCALC, TRIG, CHOLHDL, LDLDIRECT in the last 72 hours. Thyroid Function Tests: Recent Labs    09/03/19 0326  TSH 0.677   Anemia Panel: No results for input(s): VITAMINB12, FOLATE, FERRITIN, TIBC, IRON, RETICCTPCT in the last 72 hours. Sepsis Labs: No results for input(s): PROCALCITON, LATICACIDVEN in the last 168 hours.  Recent Results (from the past 240 hour(s))  Respiratory Panel by RT PCR (Flu A&B, Covid) - Nasopharyngeal Swab     Status: None   Collection Time: 09/02/19  5:53 AM   Specimen: Nasopharyngeal Swab  Result Value Ref Range Status   SARS Coronavirus 2 by RT PCR NEGATIVE NEGATIVE Final    Comment: (NOTE) SARS-CoV-2 target nucleic acids are NOT DETECTED. The SARS-CoV-2 RNA is generally detectable in upper respiratoy specimens during the acute phase of infection. The lowest concentration of SARS-CoV-2 viral copies this assay can detect is 131 copies/mL. A negative result does not preclude SARS-Cov-2 infection and should not be used as the sole basis for treatment or other patient management decisions. A negative result may occur with  improper specimen collection/handling, submission of specimen other than nasopharyngeal swab, presence of viral mutation(s) within the areas targeted by this assay, and inadequate number of  viral copies (<131 copies/mL). A negative result must be combined with clinical observations, patient history, and epidemiological information. The expected result is Negative. Fact Sheet for Patients:  PinkCheek.be Fact Sheet for Healthcare Providers:  GravelBags.it This test is not yet ap proved or cleared by the [place] FDA and  has been authorized for detection and/or diagnosis of SARS-CoV-2 by FDA under an Emergency Use Authorization (EUA). This EUA will remain  in effect (meaning this test can be used) for the duration of the COVID-19 declaration under Section 564(b)(1) of the Act, 21 U.S.C. section 360bbb-3(b)(1), unless the authorization is terminated or revoked sooner.    Influenza A by PCR NEGATIVE NEGATIVE Final   Influenza B by PCR NEGATIVE NEGATIVE Final    Comment: (NOTE) The Xpert Xpress SARS-CoV-2/FLU/RSV assay is intended as an aid in  the diagnosis of influenza from Nasopharyngeal swab specimens and  should not be used as a sole basis for treatment. Nasal washings and  aspirates are unacceptable for Xpert Xpress SARS-CoV-2/FLU/RSV  testing. Fact Sheet for Patients: PinkCheek.be Fact Sheet for Healthcare Providers: GravelBags.it This test is not yet approved or cleared by the [place] FDA and  has been authorized for detection and/or diagnosis of SARS-CoV-2 by  FDA under an Emergency Use Authorization (EUA). This EUA will remain  in effect (meaning this test can be used) for the duration of the  Covid-19 declaration under Section 564(b)(1) of the Act, 21  U.S.C. section 360bbb-3(b)(1), unless the authorization is  terminated or revoked. Performed at [facility], [place] [address], [place], [place] [address]       Radiology Studies: DG Chest Portable 1 View  Result Date: 09/02/2019 CLINICAL DATA:  Shortness of breath. Cough. EXAM:  PORTABLE CHEST 1 VIEW COMPARISON:  Most recent radiograph 08/22/2019. Most recent CT 08/08/2019 FINDINGS: Two lead left-sided pacemaker in place. Unchanged heart size and mediastinal contours. Aortic atherosclerosis. Interstitial opacities with septal thickening  and Kerley B-lines consistent with pulmonary edema. There are small to moderate bilateral pleural effusions. Previous hiatal hernia not well seen. Previous right perihilar opacity is currently obscured by pulmonary edema. No pneumothorax. IMPRESSION: Pulmonary edema, new from prior exam. Increased pleural effusions, moderate in size. Previous right perihilar opacity is obscured by pulmonary edema. Aortic Atherosclerosis (ICD10-I70.0). Electronically Signed   By: Keith Rake M.D.   On: 09/02/2019 05:21     Scheduled Meds: . albuterol  10 mg/hr Nebulization Once  . apixaban  2.5 mg Oral BID  . arformoterol  15 mcg Nebulization BID  . aspirin  81 mg Oral Daily  . budesonide (PULMICORT) nebulizer solution  0.25 mg Nebulization BID  . clonazePAM  0.5 mg Oral QHS  . clopidogrel  75 mg Oral Daily  . donepezil  10 mg Oral Q supper  . doxycycline  100 mg Oral Q12H  . DULoxetine  60 mg Oral Daily  . [START ON 09/04/2019] furosemide  40 mg Intravenous Daily  . gabapentin  600 mg Oral QHS  . gabapentin  300 mg Oral BID  . levothyroxine  125 mcg Oral QAC breakfast  . methylPREDNISolone (SOLU-MEDROL) injection  60 mg Intravenous Q8H  . pantoprazole  40 mg Oral Daily  . potassium chloride  10 mEq Oral Daily  . pravastatin  40 mg Oral Daily  . sodium chloride flush  3 mL Intravenous Q12H   Continuous Infusions: . sodium chloride       LOS: 1 day    Time spent: 25 minutes spent in the coordination of care today.    Jonnie Finner, DO [facility]  If 7PM-7AM, please contact night-coverage [email or web address] 09/03/2019, 2:14 PM

## 2019-09-03 NOTE — Evaluation (Signed)
Occupational Therapy Evaluation Patient Details Name: Brandi Raymond MRN: [id number] DOB: 06/14/1934 Today's Date: 09/03/2019    History of Present Illness Pt is an 86 year old woman admitted with SOB requiring bipap in the ED. PMH: COPD on 2L home 02, CAD, HTN, hypothyroid, s/p pacemaker on 08/21/19 due to heart block.   Clinical Impression   Pt was intermittently using a rollator for mobility and independent in self care. She and her husband worked together on IADL. Pt presents with decreased activity tolerance, impaired standing balance and impaired cognition (likely baseline). Pt requires up to min guard assist for ADL. Currently requiring 4L 02 to maintain Sp02 in upper 90s. Will follow acutely.    Follow Up Recommendations  Home health OT    Equipment Recommendations       Recommendations for Other Services       Precautions / Restrictions Precautions Precautions: Fall Precaution Comments: daughter reports she has had falls, forgets to use her rollator Restrictions Weight Bearing Restrictions: No      Mobility Bed Mobility Overal bed mobility: Modified Independent             General bed mobility comments: HOB up slightly, use of rail, no physical assist  Transfers Overall transfer level: Needs assistance Equipment used: 1 person hand held assist Transfers: Sit to/from Stand;Stand Pivot Transfers Sit to Stand: Min guard Stand pivot transfers: Min guard       General transfer comment: pt reaching for arms of BSC    Balance Overall balance assessment: Needs assistance   Sitting balance-Leahy Scale: Good       Standing balance-Leahy Scale: Fair Standing balance comment: statically                           ADL either performed or assessed with clinical judgement   ADL Overall ADL's : Needs assistance/impaired Eating/Feeding: Independent   Grooming: Wash/dry hands;Sitting;Set up   Upper Body Bathing: Set up;Sitting   Lower Body  Bathing: Min guard;Sit to/from stand   Upper Body Dressing : Set up;Sitting   Lower Body Dressing: Min guard;Sit to/from stand   Toilet Transfer: Min guard;Stand-pivot;BSC   Toileting- Water quality scientist and Hygiene: Min guard;Sit to/from stand               Vision Baseline Vision/History: Wears glasses Patient Visual Report: No change from baseline       Perception     Praxis      Pertinent Vitals/Pain Pain Assessment: No/denies pain     Hand Dominance Right   Extremity/Trunk Assessment Upper Extremity Assessment Upper Extremity Assessment: Overall WFL for tasks assessed   Lower Extremity Assessment Lower Extremity Assessment: Defer to PT evaluation       Communication Communication Communication: No difficulties   Cognition Arousal/Alertness: Awake/alert Behavior During Therapy: WFL for tasks assessed/performed Overall Cognitive Status: Impaired/Different from baseline Area of Impairment: Memory;Safety/judgement                     Memory: Decreased short-term memory   Safety/Judgement: Decreased awareness of deficits         General Comments       Exercises     Shoulder Instructions      Home Living Family/patient expects to be discharged to:: Private residence Living Arrangements: Spouse/significant other Available Help at Discharge: Family;Available 24 hours/day Type of Home: House Home Access: Stairs to enter [employer] of Steps: 3 Entrance Stairs-Rails: Right Home  Layout: One level     Bathroom Shower/Tub: Occupational psychologist: Standard     Home Equipment: Environmental consultant - 4 wheels;Bedside commode   Additional Comments: uses 3 in 1 as shower seat      Prior Functioning/Environment Level of Independence: Independent with assistive device(s)        Comments: 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        OT Problem List: Decreased safety awareness;Decreased  cognition;Decreased knowledge of use of DME or AE;Cardiopulmonary status limiting activity;Impaired balance (sitting and/or standing);Decreased activity tolerance      OT Treatment/Interventions: Self-care/ADL training;DME and/or AE instruction;Cognitive remediation/compensation;Therapeutic activities;Patient/family education;Balance training    OT Goals(Current goals can be found in the care plan section) Acute Rehab OT Goals Patient Stated Goal: to go home OT Goal Formulation: With patient Time For Goal Achievement: 09/17/19 Potential to Achieve Goals: Good ADL Goals Pt Will Perform Grooming: with supervision;standing(3 activities) Pt Will Perform Lower Body Bathing: with supervision;sit to/from stand Pt Will Perform Lower Body Dressing: with supervision;sit to/from stand Pt Will Transfer to Toilet: with supervision;bedside commode;ambulating Pt Will Perform Toileting - Clothing Manipulation and hygiene: with supervision;sit to/from stand Pt Will Perform Tub/Shower Transfer: with supervision;ambulating;rolling walker Additional ADL Goal #1: Pt will utilize energy conservation and breathing techniques during ADL and mobility independently.  OT Frequency: Min 2X/week   Barriers to D/C:            Co-evaluation              AM-PAC OT "6 Clicks" Daily Activity     Outcome Measure Help from another person eating meals?: None Help from another person taking care of personal grooming?: A Little Help from another person toileting, which includes using toliet, bedpan, or urinal?: A Little Help from another person bathing (including washing, rinsing, drying)?: A Little Help from another person to put on and taking off regular upper body clothing?: None Help from another person to put on and taking off regular lower body clothing?: A Little 6 Click Score: 20   End of Session Equipment Utilized During Treatment: Gait belt;Oxygen(4L)  Activity Tolerance: Patient tolerated treatment  well Patient left: in bed;with call bell/phone within reach;with family/visitor present  OT Visit Diagnosis: Unsteadiness on feet (R26.81);Other abnormalities of gait and mobility (R26.89);Other symptoms and signs involving cognitive function                Time: 1191-4782 OT Time Calculation (min): 18 min Charges:  OT General Charges $OT Visit: 1 Visit OT Evaluation $OT Eval Moderate Complexity: 1 Mod  Brandi Raymond, OTR/L Acute Rehabilitation Services Pager: [phone] Office: [phone]  Malka So 09/03/2019, 3:43 PM

## 2019-09-03 NOTE — Progress Notes (Signed)
Progress Note  Patient Name: Brandi Raymond Date of Encounter: 09/03/2019  Primary Cardiologist: Quay Burow, MD   Subjective   86 year old female with a history of hyperlipidemia, hypertension, COPD with a 69-year smoking history, coronary artery disease, secondary degree AV block with pacemaker placed August 21, 2019.  She was seen for further evaluation of progressive congestive heart failure.  She has received IV Lasix yesterday.  She is put out 3.3 L since admission.  Feels much better She had been eating lots of salty foods at home    Inpatient Medications    Scheduled Meds:  albuterol  10 mg/hr Nebulization Once   apixaban  2.5 mg Oral BID   arformoterol  15 mcg Nebulization BID   aspirin  81 mg Oral Daily   budesonide (PULMICORT) nebulizer solution  0.25 mg Nebulization BID   clonazePAM  0.5 mg Oral QHS   clopidogrel  75 mg Oral Daily   donepezil  10 mg Oral Q supper   doxycycline  100 mg Oral Q12H   DULoxetine  60 mg Oral Daily   furosemide  40 mg Intravenous Q12H   gabapentin  600 mg Oral QHS   gabapentin  300 mg Oral BID   levothyroxine  125 mcg Oral QAC breakfast   methylPREDNISolone (SOLU-MEDROL) injection  60 mg Intravenous Q8H   pantoprazole  40 mg Oral Daily   pravastatin  40 mg Oral Daily   sodium chloride flush  3 mL Intravenous Q12H   Continuous Infusions:  sodium chloride     PRN Meds: sodium chloride, acetaminophen, albuterol, hydrALAZINE, ondansetron (ZOFRAN) IV, sodium chloride flush   Vital Signs    Vitals:   09/03/19 0400 09/03/19 0500 09/03/19 0805 09/03/19 0829  BP: 123/73  107/82   Pulse: 96  83   Resp: 15     Temp: 98.4 F (36.9 C)  97.7 F (36.5 C)   TempSrc: Oral     SpO2: 99%  99% 100%  Weight:  57.6 kg    Height:        Intake/Output Summary (Last 24 hours) at 09/03/2019 0920 Last data filed at 09/03/2019 0500 Gross per 24 hour  Intake --  Output 3300 ml  Net -3300 ml   Last 3 Weights  09/03/2019 09/02/2019 08/22/2019  Weight (lbs) 126 lb 15.8 oz 125 lb 10.6 oz 125 lb 10.6 oz  Weight (kg) 57.6 kg 57 kg 57 kg      Telemetry    NSR  - Personally Reviewed  ECG     - Personally Reviewed  Physical Exam   GEN: elderly female,  NAD  Cardiac: RRR, no murmurs, rubs, or gallops.  Respiratory: Clear to auscultation bilaterally. GI: Soft, nontender, non-distended  MS: No edema; No deformity. Neuro:  Nonfocal  Psych: Normal affect   Labs    High Sensitivity Troponin:   Recent Labs  Lab 09/02/19 0520 09/02/19 0950 09/02/19 1648  TROPONINIHS 28* 42* 56*      Chemistry Recent Labs  Lab 09/02/19 0630 09/02/19 0630 09/02/19 0641 09/02/19 0641 09/02/19 0745 09/02/19 0949 09/03/19 0326  NA 142   < > 142   < > 143 143 140  K 4.1   < > 4.0   < > 3.7 3.9 4.0  CL 104  --  106  --   --   --  100  CO2 26  --   --   --   --   --  26  GLUCOSE 221*  --  212*  --   --   --  123*  BUN 18  --  24*  --   --   --  19  CREATININE 1.41*  --  1.30*  --   --   --  1.12*  CALCIUM 8.3*  --   --   --   --   --  7.8*  GFRNONAA 34*  --   --   --   --   --  45*  GFRAA 40*  --   --   --   --   --  52*  ANIONGAP 12  --   --   --   --   --  14   < > = values in this interval not displayed.     Hematology Recent Labs  Lab 09/02/19 0520 09/02/19 0520 09/02/19 0641 09/02/19 0745 09/02/19 0949  WBC 12.2*  --   --   --   --   RBC 4.10  --   --   --   --   HGB 11.3*   < > 11.2* 11.9* 11.9*  HCT 38.8   < > 33.0* 35.0* 35.0*  MCV 94.6  --   --   --   --   MCH 27.6  --   --   --   --   MCHC 29.1*  --   --   --   --   RDW 15.5  --   --   --   --   PLT 316  --   --   --   --    < > = values in this interval not displayed.    BNP Recent Labs  Lab 09/02/19 0520  BNP 885.2*     DDimer No results for input(s): DDIMER in the last 168 hours.   Radiology    DG Chest Portable 1 View  Result Date: 09/02/2019 CLINICAL DATA:  Shortness of breath. Cough. EXAM: PORTABLE CHEST 1  VIEW COMPARISON:  Most recent radiograph 08/22/2019. Most recent CT 08/08/2019 FINDINGS: Two lead left-sided pacemaker in place. Unchanged heart size and mediastinal contours. Aortic atherosclerosis. Interstitial opacities with septal thickening and Kerley B-lines consistent with pulmonary edema. There are small to moderate bilateral pleural effusions. Previous hiatal hernia not well seen. Previous right perihilar opacity is currently obscured by pulmonary edema. No pneumothorax. IMPRESSION: Pulmonary edema, new from prior exam. Increased pleural effusions, moderate in size. Previous right perihilar opacity is obscured by pulmonary edema. Aortic Atherosclerosis (ICD10-I70.0). Electronically Signed   By: Keith Rake M.D.   On: 09/02/2019 05:21    Cardiac Studies     Patient Profile     86 y.o. female with CAD , COPD , PAF, pacer , acute on chronic diastolic CHF  Assessment & Plan    1.  Acute on chronic diastolic congestive heart failure: She has diuresed 3.3 L since admission.  I reduce the Lasix to 40 mg once daily. She is not on a diuretic at home.  I would suggest that we discharge her on Lasix 40 mg a day and potassium chloride 10 mEq a day.  She is very close to her baseline .   She does not need a repeat echo - she had an echo on Feb. 3, 2021.  2.  PAF:   Pacer interrogation has revealed PAF.   She has been started on Eliquis 2.5 BID .   Currently A-pacing . No additional meds needed.   3. COPD:  Plans per IM  4.  CAD :  No angnia   5.  HTN:   BP is better .Marland Kitchen Lisinopril is on hold due to renal concerns   [facility] will sign off.   Medication Recommendations:  Cont current meds  Other recommendations (labs, testing, etc):   Follow up as an outpatient:  With Dr. Gwenlyn Found and Dr. Rayann Heman  For questions or updates, please contact [facility] Please consult [email or web address] for contact info under        Signed, Mertie Moores, MD  09/03/2019, 9:20 AM

## 2019-09-03 NOTE — Discharge Instructions (Signed)

## 2019-09-04 ENCOUNTER — Encounter: Payer: PPO | Admitting: Student

## 2019-09-04 ENCOUNTER — Ambulatory Visit: Payer: PPO

## 2019-09-04 LAB — COMPREHENSIVE METABOLIC PANEL
ALT: 12 U/L (ref 0–44)
AST: 26 U/L (ref 15–41)
Albumin: 2.9 g/dL — ABNORMAL LOW (ref 3.5–5.0)
Alkaline Phosphatase: 48 U/L (ref 38–126)
Anion gap: 11 (ref 5–15)
BUN: 35 mg/dL — ABNORMAL HIGH (ref 8–23)
CO2: 26 mmol/L (ref 22–32)
Calcium: 7.9 mg/dL — ABNORMAL LOW (ref 8.9–10.3)
Chloride: 100 mmol/L (ref 98–111)
Creatinine, Ser: 1.34 mg/dL — ABNORMAL HIGH (ref 0.44–1.00)
GFR calc Af Amer: 42 mL/min — ABNORMAL LOW (ref >60)
GFR calc non Af Amer: 36 mL/min — ABNORMAL LOW (ref >60)
Glucose, Bld: 103 mg/dL — ABNORMAL HIGH (ref 70–99)
Potassium: 4.2 mmol/L (ref 3.5–5.1)
Sodium: 137 mmol/L (ref 135–145)
Total Bilirubin: 0.9 mg/dL (ref 0.3–1.2)
Total Protein: 5.5 g/dL — ABNORMAL LOW (ref 6.5–8.1)

## 2019-09-04 LAB — CBC WITH DIFFERENTIAL/PLATELET
Abs Immature Granulocytes: 0.1 10*3/uL — ABNORMAL HIGH (ref 0.00–0.07)
Basophils Absolute: 0 10*3/uL (ref 0.0–0.1)
Basophils Relative: 0 %
Eosinophils Absolute: 0 10*3/uL (ref 0.0–0.5)
Eosinophils Relative: 0 %
HCT: 32.4 % — ABNORMAL LOW (ref 36.0–46.0)
Hemoglobin: 9.9 g/dL — ABNORMAL LOW (ref 12.0–15.0)
Immature Granulocytes: 1 %
Lymphocytes Relative: 13 %
Lymphs Abs: 2 10*3/uL (ref 0.7–4.0)
MCH: 27.3 pg (ref 26.0–34.0)
MCHC: 30.6 g/dL (ref 30.0–36.0)
MCV: 89.3 fL (ref 80.0–100.0)
Monocytes Absolute: 1.2 10*3/uL — ABNORMAL HIGH (ref 0.1–1.0)
Monocytes Relative: 8 %
Neutro Abs: 12.8 10*3/uL — ABNORMAL HIGH (ref 1.7–7.7)
Neutrophils Relative %: 78 %
Platelets: 204 10*3/uL (ref 150–400)
RBC: 3.63 MIL/uL — ABNORMAL LOW (ref 3.87–5.11)
RDW: 15.1 % (ref 11.5–15.5)
WBC: 16.1 10*3/uL — ABNORMAL HIGH (ref 4.0–10.5)
nRBC: 0 % (ref 0.0–0.2)

## 2019-09-04 LAB — MAGNESIUM: Magnesium: 2.1 mg/dL (ref 1.7–2.4)

## 2019-09-04 MED ORDER — POLYETHYLENE GLYCOL 3350 17 G PO PACK: 17.0000 g | PACK | Freq: Every day | ORAL | Status: DC | PRN

## 2019-09-04 MED ORDER — FUROSEMIDE 40 MG PO TABS
40.0000 mg | ORAL_TABLET | Freq: Every day | ORAL | Status: DC
  Administered 2019-09-04 – 2019-09-05 (×2): 40 mg via ORAL
  Filled 2019-09-04 (×2): qty 1

## 2019-09-04 MED ORDER — GUAIFENESIN ER 600 MG PO TB12
600.0000 mg | ORAL_TABLET | Freq: Two times a day (BID) | ORAL | Status: DC
  Administered 2019-09-04 – 2019-09-05 (×3): 600 mg via ORAL
  Filled 2019-09-04 (×3): qty 1

## 2019-09-04 MED ORDER — DOCUSATE SODIUM 100 MG PO CAPS
100.0000 mg | ORAL_CAPSULE | Freq: Two times a day (BID) | ORAL | Status: DC
  Administered 2019-09-04 – 2019-09-05 (×3): 100 mg via ORAL
  Filled 2019-09-04 (×3): qty 1

## 2019-09-04 NOTE — TOC Initial Note (Addendum)
Transition of Care [facility]) - Initial/Assessment Note    Patient Details  Name: Brandi Raymond MRN: [id number] Date of Birth: 12/18/1933  Transition of Care [facility]) CM/SW Contact:    Maryclare Labrador, RN Phone Number: 09/04/2019, 4:18 PM  Clinical Narrative:  CM spoke with pt via phone.  Pt informed CM that she is from home with her husband - her daughter is also with her most of the time.  Pt confirms she will have 24 hour supervision at discharge.  Pt educated on importance of daily weights.  Pt already has walker in the home and on 2 liters home oxygen supplied by [facility].  Pt deferred discharge discussions and choice of [facility] agency to her daughter.  CM reached daughter and provided medicare.gov HH list - [facility] chosen however was unable to accept.  Daughter did not have a second preference.  [facility] can accept pt and daughter in agreement. CM request [place] orders and face to face.   TOC will continue to follw     [facility] verbal consult given                    Barriers to Discharge: Continued Medical Work up   Patient Goals and CMS Choice   CMS Medicare.gov Compare Post Acute Care list provided to:: Patient Choice offered to / list presented to : Patient, Adult Children  Expected Discharge Plan and Services         Living arrangements for the past 2 months: Single Family Home                           HH Arranged: OT, PT HH Agency: [place] Date [place]: 09/04/19 Time [place]: 1618 Representative spoke with at [place]: [place] Arrangements/Services Living arrangements for the past 2 months: [place] with:: Spouse Patient language and need for interpreter reviewed:: Yes Do you feel safe going back to the place where you live?: Yes      Need for Family Participation in Patient Care: Yes (Comment) Care giver support system in place?: Yes (comment)   Criminal Activity/Legal Involvement Pertinent to  Current Situation/Hospitalization: No - Comment as needed  Activities of Daily Living Home Assistive Devices/Equipment: Environmental consultant (specify type)(4 Wheel walker ) ADL Screening (condition at time of admission) Patient's cognitive ability adequate to safely complete daily activities?: Yes Is the patient deaf or have difficulty hearing?: No Does the patient have difficulty seeing, even when wearing glasses/contacts?: Yes Does the patient have difficulty concentrating, remembering, or making decisions?: Yes Patient able to express need for assistance with ADLs?: Yes Does the patient have difficulty dressing or bathing?: No Independently performs ADLs?: Yes (appropriate for developmental age) Does the patient have difficulty walking or climbing stairs?: No Weakness of Legs: None Weakness of Arms/Hands: None  Permission Sought/Granted   Permission granted to share information with : Yes, Verbal Permission Granted              Emotional Assessment Appearance:: Other (Comment Required Attitude/Demeanor/Rapport: Gracious, Charismatic, Self-Confident, Engaged Affect (typically observed): Adaptable Orientation: : Oriented to Self, Oriented to Place, Oriented to  Time, Oriented to Situation      Admission diagnosis:  Acute respiratory failure (HCC) [J96.00] Respiratory acidosis [E87.2] Acute pulmonary edema (HCC) [J81.0] COPD exacerbation (HCC) [J44.1] Acute respiratory failure with hypoxia and hypercapnia (HCC) [J96.01, J96.02] Patient Active Problem List   Diagnosis Date Noted  .  Acute respiratory failure ([place]) 09/02/2019  . Acute diastolic CHF (congestive heart failure) ([place]) 09/02/2019  . Paroxysmal atrial fibrillation (HCC)   . Second degree Mobitz II AV block 08/21/2019  . Second degree AV block 08/08/2019  . SOB (shortness of breath) 08/05/2019  . Chest tightness 08/05/2019  . GERD (gastroesophageal reflux disease) 05/21/2019  . Chronic respiratory failure with hypoxia ([place])  05/21/2019  . Medication management 05/21/2019  . Stage I squamous cell carcinoma of right lung ([place]) 02/06/2019  . Constipation 09/11/2018  . COPD with acute exacerbation ([place]) 09/04/2018  . Right lower lobe lung mass 09/04/2018  . Closed compression fracture of L1 lumbar vertebra, initial encounter ([place]) 09/04/2018  . Preoperative clearance 07/19/2018  . Lower back pain 06/25/2018  . Cubital tunnel syndrome on left 05/22/2018  . Dupuytren's contracture of both hands 05/10/2018  . Primary osteoarthritis of both knees 05/10/2018  . Difficulty urinating 05/10/2018  . Osteoporosis 06/14/2017  . Prediabetes 06/23/2015  . Depression 06/23/2015  . Carotid stenosis 10/12/2014  . Sleep disorder 04/09/2014  . Mesenteric artery stenosis ([place]) 01/13/2013  . Thoracic aneurysm without mention of rupture 11/04/2012  . Chronic mesenteric ischemia ([place]) 10/08/2012  . Memory deficit 06/20/2012  . Irritable bowel syndrome 06/20/2012  . Ocular myasthenia gravis ([place]) 06/20/2012  . PVD (peripheral vascular disease) ([place]) 06/20/2012  . Hereditary and idiopathic peripheral neuropathy 05/22/2012  . Syncope 08/28/2011  . ABDOMINAL BRUIT 08/17/2009  . Coronary atherosclerosis 09/05/2008  . Hypothyroidism 05/26/2008  . VITAMIN D DEFICIENCY 05/26/2008  . CHOLELITHIASIS 12/06/2007  . DIVERTICULOSIS, COLON 12/05/2007  . HYPERLIPIDEMIA 08/19/2007  . COPD (chronic obstructive pulmonary disease) with emphysema ([place]) 08/19/2007  . CIGARETTE SMOKER 02/06/2007  . Essential hypertension 02/06/2007  . COLONIC POLYPS 11/21/2004   PCP:  Binnie Rail, MD Pharmacy:   [id number] - [place], [place] [address] [place] [address] Phone: [phone] Fax: [phone]  [facility] [place] [place] - [address] [place] [address] Phone: [phone] Fax: [phone]     Social Determinants of Health (SDOH)  Interventions    Readmission Risk Interventions No flowsheet data found.

## 2019-09-04 NOTE — Progress Notes (Signed)
Physical Therapy Treatment Patient Details Name: Brandi Raymond MRN: [id number] DOB: 1934/05/24 Today's Date: 09/04/2019    History of Present Illness Pt is an 86 year old woman admitted with SOB requiring bipap in the ED. PMH: COPD on 2L home 02, CAD, HTN, hypothyroid, s/p pacemaker on 08/21/19 due to heart block.    PT Comments    Pt maintaining level of functional mobility. Ambulating 200 feet with a Rollator at a min guard assist level, SpO2 92-96% on 2L O2. Continues with decreased endurance, balance impairments, and decreased safety awareness. D/c plan remains appropriate.     Follow Up Recommendations  Home health PT;Supervision/Assistance - 24 hour     Equipment Recommendations  None recommended by PT    Recommendations for Other Services       Precautions / Restrictions Precautions Precautions: Fall Precaution Comments: daughter reports she has had falls, forgets to use her rollator Restrictions Weight Bearing Restrictions: No    Mobility  Bed Mobility Overal bed mobility: Modified Independent                Transfers Overall transfer level: Needs assistance Equipment used: 4-wheeled walker Transfers: Sit to/from Stand;Stand Pivot Transfers Sit to Stand: Supervision;Min guard         General transfer comment: Supervision from edge of bed, min guard from low surface of toilet. Cues for locking Rollator brakes prior to transitions.  Ambulation/Gait Ambulation/Gait assistance: Min guard Gait Distance (Feet): 200 Feet Assistive device: 4-wheeled walker Gait Pattern/deviations: Step-through pattern;Decreased stride length Gait velocity: decreased   General Gait Details: No overt LOB, cues for environmental negotiation.   Stairs             Wheelchair Mobility    Modified Rankin (Stroke Patients Only)       Balance Overall balance assessment: Needs assistance   Sitting balance-Leahy Scale: Good       Standing balance-Leahy Scale:  Fair Standing balance comment: statically                            Cognition Arousal/Alertness: Awake/alert Behavior During Therapy: WFL for tasks assessed/performed Overall Cognitive Status: Impaired/Different from baseline Area of Impairment: Memory;Safety/judgement                     Memory: Decreased short-term memory   Safety/Judgement: Decreased awareness of deficits            Exercises      General Comments        Pertinent Vitals/Pain Pain Assessment: Faces Faces Pain Scale: No hurt    Home Living                      Prior Function            PT Goals (current goals can now be found in the care plan section) Acute Rehab PT Goals Patient Stated Goal: to go home PT Goal Formulation: With patient/family Time For Goal Achievement: 09/17/19 Potential to Achieve Goals: Good Progress towards PT goals: Progressing toward goals    Frequency    Min 3X/week      PT Plan Current plan remains appropriate    Co-evaluation              AM-PAC PT "6 Clicks" Mobility   Outcome Measure  Help needed turning from your back to your side while in a flat bed without using bedrails?: None Help needed  moving from lying on your back to sitting on the side of a flat bed without using bedrails?: None Help needed moving to and from a bed to a chair (including a wheelchair)?: None Help needed standing up from a chair using your arms (e.g., wheelchair or bedside chair)?: None Help needed to walk in hospital room?: A Little Help needed climbing 3-5 steps with a railing? : A Little 6 Click Score: 22    End of Session Equipment Utilized During Treatment: Gait belt;Oxygen Activity Tolerance: Patient tolerated treatment well Patient left: with call bell/phone within reach;with family/visitor present;in chair Nurse Communication: Mobility status PT Visit Diagnosis: Unsteadiness on feet (R26.81);History of falling (Z91.81);Difficulty in  walking, not elsewhere classified (R26.2)     Time: 8756-4332 PT Time Calculation (min) (ACUTE ONLY): 18 min  Charges:  $Therapeutic Activity: 8-22 mins                       Wyona Almas, PT, DPT Acute Rehabilitation Services Pager [phone] Office [phone]    Deno Etienne 09/04/2019, 5:32 PM

## 2019-09-04 NOTE — Progress Notes (Signed)
[facility]  PROGRESS NOTE    Brandi Raymond  XAJ:[id number] DOB: [birth year] DOA: 09/02/2019 PCP: Binnie Rail, MD   Brief Narrative:   Patient is a 86 year old female with history of CAD, COPD, GERD, hypertension, hyperlipidemia, hypothyroidism, second-degree AV block with symptomatic bradycardia status post pacemaker placement on 08/21/2019(Dr. Allred)presented to ED with acute shortness of breath. Per chart review, pacemaker check on 2/9 had shown new onset atrial fibrillation, patient not on anticoagulation. She has an appointment on 2/18 with cardiology for device interrogation.  Patient presented to ED with acute shortness of breath that started overnight. At the time of my encounter, patient is on BiPAP and could not provide any history. History was obtained from patient's daughter, Allene Pyo the phone.  Patient's daughter reported that yesterday patient was in her baseline state of health, she received a call from her father earlier this morning thatpatient became acutely short of breath overnight. She also stated that patient washaving shortness of breath in the last 36months, she was found to have bradycardia with heart block. Her symptoms of shortness of breath were thought to be from symptomatic bradycardia hence pacemaker was placed on 08/21/2019. However, her symptoms still did not improve and they were supposed to follow up with cardiology on 2/18. No fevers or chills however patient was having congestion with productive cough, shortness of breathandwheezing. She had pneumonia 3 weeks ago. Patient received albuterol, 2 g mag sulfate and 125 mg of Solu-Medrol with minimal improvement. Patient was placed on BiPAP in ED.  09/04/19: Transition to PO lasix from IV. She is doing better. Taking a bump in her Scr/BUN. Monitor. Afebrile ON. Vitals are ok. Baseline O2 use is 2L [place]. Continue to wean. Will likely go home with steroid taper as well.    Assessment & Plan:   Principal Problem:    Acute respiratory failure (HCC) Active Problems:   Hypothyroidism   HYPERLIPIDEMIA   COPD with acute exacerbation (HCC)   GERD (gastroesophageal reflux disease)   Chronic respiratory failure with hypoxia (HCC)   Second degree Mobitz II AV block   Acute diastolic CHF (congestive heart failure) (HCC)  Acute hypercarbic respiratory failure (HCC) likely secondary to COPD exacerbation and acute diastolic CHF Acute diastolic CHF Acute COPD     - Chest x-ray showed pulmonary edema, new from prior exams, increased pleural effusions, moderate in size. Previous right perihilar opacities obscured by pulmonary edema.           - Elevated BNP.  Echo on 08/20/2019 had shown EF of 50 to 55%, low normal left ventricular function     - Given BID IV lasix and has improved (3.3L down; O2 down to 4L [place])     - cards has eval'd; appreciate assistance, decreased lasix, have s/o'd     - let's decrease steroids from TID to daily; continue breathing Tx, doxy and IS     - 09/04/19: Decrease lasix IV to PO 40mg . Wean O2 as able.      - getting IV steroids today, but will likely be able to transition to a steroid taper by the morning/or at discharge if she goes this evening     - doxy Day 3 of 7  Acute kidney injury     - likely d/t decreased renal perfusion     - baseline creatinine 0.9.  Presented with creatinine of 1.4     - 09/04/19: Scr took a bump today; continue holding ACEi, decreasing lasix to 40mg  PO, monitor  Hypothyroidism     -  synthroid     - TSH is 0.677  Hyperlipidemia     - continue pravastatin  GERD (gastroesophageal reflux disease)     - continue protonix  Second degree Mobitz II AV block Paroxysmal afib CAD     - Recent pacemaker placement, cardiology has been consulted for follow-up     - pacer interogated; found 21% a fib burden, started on eliquis     - continue ASA, statin   History of falls     - Per daughter, patient ambulates with a walker at baseline however frequently  forgets to use it, has falls      - PT/OT consulted     - 09/04/19: [place] rec'd; will arrange  Dementia      - continue Cymbalta, donepezil  DVT prophylaxis: eliquis Code Status: FULL Family Communication: Spoke with dtr by phone   Disposition Plan: To home with [facility] when O2 stabilized  Consultants:   Cardiology  Antimicrobials:  . doxy   ROS:  Reports cough. Denies CP, N, V . Remainder 10-pt ROS is negative for all not previously mentioned.  Subjective: "I think I'm getting all this stuff out of me."  Objective: Vitals:   09/04/19 0410 09/04/19 0500 09/04/19 0730 09/04/19 0736  BP: 110/65   132/72  Pulse:   79   Resp:   14 18  Temp: 98.1 F (36.7 C)   97.9 F (36.6 C)  TempSrc: Oral   Oral  SpO2: 99%  100% 100%  Weight:  57.1 kg    Height:        Intake/Output Summary (Last 24 hours) at 09/04/2019 1055 Last data filed at 09/04/2019 0740 Gross per 24 hour  Intake 756 ml  Output 1525 ml  Net -769 ml   Filed Weights   09/02/19 0424 09/03/19 0500 09/04/19 0500  Weight: 57 kg 57.6 kg 57.1 kg    Examination:  General: 86 y.o. female resting in bed in NAD Cardiovascular: RRR, +S1, S2, no m/g/r, equal pulses throughout Respiratory: UAT, exp wheeze improved, normal WOB, on 3L [place] GI: BS+, NDNT, no masses noted, no organomegaly noted MSK: No e/c/c Neuro: Alert to name, follows commands Psyc: Appropriate interaction and affect, calm/cooperative   Data Reviewed: I have personally reviewed following labs and imaging studies.  CBC: Recent Labs  Lab 09/02/19 0520 09/02/19 0641 09/02/19 0745 09/02/19 0949 09/04/19 0230  WBC 12.2*  --   --   --  16.1*  NEUTROABS 8.1*  --   --   --  12.8*  HGB 11.3* 11.2* 11.9* 11.9* 9.9*  HCT 38.8 33.0* 35.0* 35.0* 32.4*  MCV 94.6  --   --   --  89.3  PLT 316  --   --   --  947   Basic Metabolic Panel: Recent Labs  Lab 09/02/19 0630 09/02/19 0630 09/02/19 0641 09/02/19 0745 09/02/19 0949 09/03/19 0326 09/04/19 0230   NA 142   < > 142 143 143 140 137  K 4.1   < > 4.0 3.7 3.9 4.0 4.2  CL 104  --  106  --   --  100 100  CO2 26  --   --   --   --  26 26  GLUCOSE 221*  --  212*  --   --  123* 103*  BUN 18  --  24*  --   --  19 35*  CREATININE 1.41*  --  1.30*  --   --  1.12* 1.34*  CALCIUM 8.3*  --   --   --   --  7.8* 7.9*  MG  --   --   --   --   --   --  2.1   < > = values in this interval not displayed.   GFR: Estimated Creatinine Clearance: 27 mL/min (A) (by C-G formula based on SCr of 1.34 mg/dL (H)). Liver Function Tests: Recent Labs  Lab 09/04/19 0230  AST 26  ALT 12  ALKPHOS 48  BILITOT 0.9  PROT 5.5*  ALBUMIN 2.9*   No results for input(s): LIPASE, AMYLASE in the last 168 hours. No results for input(s): AMMONIA in the last 168 hours. Coagulation Profile: No results for input(s): INR, PROTIME in the last 168 hours. Cardiac Enzymes: No results for input(s): CKTOTAL, CKMB, CKMBINDEX, TROPONINI in the last 168 hours. BNP (last 3 results) No results for input(s): PROBNP in the last 8760 hours. HbA1C: No results for input(s): HGBA1C in the last 72 hours. CBG: No results for input(s): GLUCAP in the last 168 hours. Lipid Profile: No results for input(s): CHOL, HDL, LDLCALC, TRIG, CHOLHDL, LDLDIRECT in the last 72 hours. Thyroid Function Tests: Recent Labs    09/03/19 0326  TSH 0.677   Anemia Panel: No results for input(s): VITAMINB12, FOLATE, FERRITIN, TIBC, IRON, RETICCTPCT in the last 72 hours. Sepsis Labs: No results for input(s): PROCALCITON, LATICACIDVEN in the last 168 hours.  Recent Results (from the past 240 hour(s))  Respiratory Panel by RT PCR (Flu A&B, Covid) - Nasopharyngeal Swab     Status: None   Collection Time: 09/02/19  5:53 AM   Specimen: Nasopharyngeal Swab  Result Value Ref Range Status   SARS Coronavirus 2 by RT PCR NEGATIVE NEGATIVE Final    Comment: (NOTE) SARS-CoV-2 target nucleic acids are NOT DETECTED. The SARS-CoV-2 RNA is generally detectable in  upper respiratoy specimens during the acute phase of infection. The lowest concentration of SARS-CoV-2 viral copies this assay can detect is 131 copies/mL. A negative result does not preclude SARS-Cov-2 infection and should not be used as the sole basis for treatment or other patient management decisions. A negative result may occur with  improper specimen collection/handling, submission of specimen other than nasopharyngeal swab, presence of viral mutation(s) within the areas targeted by this assay, and inadequate number of viral copies (<131 copies/mL). A negative result must be combined with clinical observations, patient history, and epidemiological information. The expected result is Negative. Fact Sheet for Patients:  PinkCheek.be Fact Sheet for Healthcare Providers:  GravelBags.it This test is not yet ap proved or cleared by the [place] FDA and  has been authorized for detection and/or diagnosis of SARS-CoV-2 by FDA under an Emergency Use Authorization (EUA). This EUA will remain  in effect (meaning this test can be used) for the duration of the COVID-19 declaration under Section 564(b)(1) of the Act, 21 U.S.C. section 360bbb-3(b)(1), unless the authorization is terminated or revoked sooner.    Influenza A by PCR NEGATIVE NEGATIVE Final   Influenza B by PCR NEGATIVE NEGATIVE Final    Comment: (NOTE) The Xpert Xpress SARS-CoV-2/FLU/RSV assay is intended as an aid in  the diagnosis of influenza from Nasopharyngeal swab specimens and  should not be used as a sole basis for treatment. Nasal washings and  aspirates are unacceptable for Xpert Xpress SARS-CoV-2/FLU/RSV  testing. Fact Sheet for Patients: PinkCheek.be Fact Sheet for Healthcare Providers: GravelBags.it This test is not yet approved or cleared by the [place] and  has been authorized for  detection and/or diagnosis of SARS-CoV-2 by  FDA under an Emergency Use Authorization (EUA). This EUA will remain  in effect (meaning this test can be used) for the duration of the  Covid-19 declaration under Section 564(b)(1) of the Act, 21  U.S.C. section 360bbb-3(b)(1), unless the authorization is  terminated or revoked. Performed at [facility], [place] [address], [place], [place] [address]       Radiology Studies: No results found.   Scheduled Meds: . albuterol  10 mg/hr Nebulization Once  . apixaban  2.5 mg Oral BID  . arformoterol  15 mcg Nebulization BID  . aspirin  81 mg Oral Daily  . budesonide (PULMICORT) nebulizer solution  0.25 mg Nebulization BID  . clonazePAM  0.5 mg Oral QHS  . clopidogrel  75 mg Oral Daily  . donepezil  10 mg Oral Q supper  . doxycycline  100 mg Oral Q12H  . DULoxetine  60 mg Oral Daily  . furosemide  40 mg Oral Daily  . gabapentin  600 mg Oral QHS  . gabapentin  300 mg Oral BID  . levothyroxine  125 mcg Oral QAC breakfast  . methylPREDNISolone (SOLU-MEDROL) injection  60 mg Intravenous Daily  . pantoprazole  40 mg Oral Daily  . potassium chloride  10 mEq Oral Daily  . pravastatin  40 mg Oral Daily  . sodium chloride flush  3 mL Intravenous Q12H   Continuous Infusions: . sodium chloride       LOS: 2 days    Time spent: 30 minutes spent in the coordination of care today.    Jonnie Finner, DO [facility]  If 7PM-7AM, please contact night-coverage [email or web address] 09/04/2019, 10:55 AM

## 2019-09-04 NOTE — Plan of Care (Signed)

## 2019-09-05 ENCOUNTER — Telehealth: Payer: Self-pay | Admitting: Internal Medicine

## 2019-09-05 DIAGNOSIS — J441 Chronic obstructive pulmonary disease with (acute) exacerbation: Secondary | ICD-10-CM

## 2019-09-05 DIAGNOSIS — E039 Hypothyroidism, unspecified: Secondary | ICD-10-CM

## 2019-09-05 DIAGNOSIS — K2100 Gastro-esophageal reflux disease with esophagitis, without bleeding: Secondary | ICD-10-CM

## 2019-09-05 LAB — BASIC METABOLIC PANEL
Anion gap: 8 (ref 5–15)
BUN: 31 mg/dL — ABNORMAL HIGH (ref 8–23)
CO2: 30 mmol/L (ref 22–32)
Calcium: 7.6 mg/dL — ABNORMAL LOW (ref 8.9–10.3)
Chloride: 101 mmol/L (ref 98–111)
Creatinine, Ser: 1.14 mg/dL — ABNORMAL HIGH (ref 0.44–1.00)
GFR calc Af Amer: 51 mL/min — ABNORMAL LOW (ref >60)
GFR calc non Af Amer: 44 mL/min — ABNORMAL LOW (ref >60)
Glucose, Bld: 101 mg/dL — ABNORMAL HIGH (ref 70–99)
Potassium: 4.2 mmol/L (ref 3.5–5.1)
Sodium: 139 mmol/L (ref 135–145)

## 2019-09-05 LAB — MAGNESIUM: Magnesium: 1.9 mg/dL (ref 1.7–2.4)

## 2019-09-05 MED ORDER — DOXYCYCLINE HYCLATE 100 MG PO TABS: 100.0000 mg | ORAL_TABLET | Freq: Two times a day (BID) | ORAL | 0 refills | Status: AC

## 2019-09-05 MED ORDER — APIXABAN 2.5 MG PO TABS: 2.5000 mg | ORAL_TABLET | Freq: Two times a day (BID) | ORAL | 1 refills | Status: DC

## 2019-09-05 MED ORDER — PREDNISONE 10 MG PO TABS: ORAL_TABLET | ORAL | 0 refills | Status: AC

## 2019-09-05 MED ORDER — POTASSIUM CHLORIDE CRYS ER 10 MEQ PO TBCR: 10.0000 meq | EXTENDED_RELEASE_TABLET | Freq: Every day | ORAL | 1 refills | Status: DC

## 2019-09-05 MED ORDER — FUROSEMIDE 40 MG PO TABS: 40.0000 mg | ORAL_TABLET | Freq: Every day | ORAL | 1 refills | Status: DC

## 2019-09-05 MED FILL — FUROSEMIDE 40 MG TABLET: 40 | 30 days supply | Qty: 30 | Fill #0

## 2019-09-05 MED FILL — POTASSIUM CHL ER M10 TABLET: 10 | 30 days supply | Qty: 30 | Fill #0

## 2019-09-05 MED FILL — predniSONE 10 MG TABS: 10 | 4 days supply | Qty: 10 | Fill #0

## 2019-09-05 MED FILL — ELIQUIS 2.5 MG TABLET: 2.5 | 30 days supply | Qty: 60 | Fill #0

## 2019-09-05 MED FILL — DOXYCYCLINE HYCLATE 100 MG: 100 | 4 days supply | Qty: 7 | Fill #0

## 2019-09-05 NOTE — TOC Transition Note (Signed)
Transition of Care [facility]) - CM/SW Discharge Note   Patient Details  Name: Brandi Raymond MRN: [id number] Date of Birth: [birth year]  [facility] CM/SW Contact:  Maryclare Labrador, RN Phone Number: 09/05/2019, 1:44 PM   Clinical Narrative:   Pt deemed appropriate for discharge home today.  CM spoke with pts daughter and informed of approximate cost of Eliquis ongoing $50 per [facility] pharmacy.  CM educated on  importance of daily weights and low salt diet.  [facility] aware of discharge.  NO other CM needs determined - CM signing off   Final next level of care: Home/Self Care Barriers to Discharge: Barriers Resolved   Patient Goals and CMS Choice   CMS Medicare.gov Compare Post Acute Care list provided to:: Patient Choice offered to / list presented to : Patient, Adult Children  Discharge Placement                       Discharge Plan and Services                          HH Arranged: OT, PT [facility] Agency: [place] Date [place]: 09/04/19 Time [place]: 1618 Representative spoke with at [place]: [place] ([place]) Interventions     Readmission Risk Interventions No flowsheet data found.

## 2019-09-05 NOTE — Discharge Summary (Addendum)
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 diagnosis of SARS-CoV-2 by  FDA under an Emergency Use Authorization (EUA). This EUA will remain  in effect (meaning this test can be used) for the duration of the  Covid-19 declaration under Section 564(b)(1) of the Act, 21  U.S.C. section 360bbb-3(b)(1), unless the authorization is  terminated or revoked. Performed at [facility], [place] [address] [place],  [address]      Labs: BNP (last 3 results) Recent Labs    09/02/19 0520  BNP 694.8*   Basic Metabolic Panel: Recent Labs  Lab 09/02/19 0630 09/02/19 0630 09/02/19 0641 09/02/19 0641 09/02/19 0745 09/02/19 0949 09/03/19 0326 09/04/19 0230 09/05/19 0611  NA 142   < > 142   < > 143 143 140 137 139  K 4.1   < > 4.0   < > 3.7  3.9 4.0 4.2 4.2  CL 104  --  106  --   --   --  100 100 101  CO2 26  --   --   --   --   --  26 26 30   GLUCOSE 221*  --  212*  --   --   --  123* 103* 101*  BUN 18  --  24*  --   --   --  19 35* 31*  CREATININE 1.41*  --  1.30*  --   --   --  1.12* 1.34* 1.14*  CALCIUM 8.3*  --   --   --   --   --  7.8* 7.9* 7.6*  MG  --   --   --   --   --   --   --  2.1 1.9   < > = values in this interval not displayed.   Liver Function Tests: Recent Labs  Lab 09/04/19 0230  AST 26  ALT 12  ALKPHOS 48  BILITOT 0.9  PROT 5.5*  ALBUMIN 2.9*   No results for input(s): LIPASE, AMYLASE in the last 168 hours. No results for input(s): AMMONIA in the last 168 hours. CBC: Recent Labs  Lab 09/02/19 0520 09/02/19 0641 09/02/19 0745 09/02/19 0949 09/04/19 0230  WBC 12.2*  --   --   --  16.1*  NEUTROABS 8.1*  --   --   --  12.8*  HGB 11.3* 11.2* 11.9* 11.9* 9.9*  HCT 38.8 33.0* 35.0* 35.0* 32.4*  MCV 94.6  --   --   --  89.3  PLT 316  --   --   --  204   Cardiac Enzymes: No results for input(s): CKTOTAL, CKMB, CKMBINDEX, TROPONINI in the last 168 hours. BNP: Invalid input(s): POCBNP CBG: No results for input(s): GLUCAP in the last 168 hours. D-Dimer No results for input(s): DDIMER in the last 72 hours. Hgb A1c No results for input(s): HGBA1C in the last 72 hours. Lipid Profile No results for input(s): CHOL, HDL, LDLCALC, TRIG, CHOLHDL, LDLDIRECT in the last 72 hours. Thyroid function studies Recent Labs    09/03/19 0326  TSH 0.677   Anemia work up No results for input(s): VITAMINB12, FOLATE, FERRITIN, TIBC, IRON, RETICCTPCT in the last 72 hours. Urinalysis    Component Value Date/Time   COLORURINE YELLOW 05/30/2018 [place] 05/30/2018 1139   LABSPEC 1.010 05/30/2018 1139   PHURINE 6.0 05/30/2018 1139   GLUCOSEU NEGATIVE 05/30/2018 1139   HGBUR NEGATIVE 05/30/2018 1139   HGBUR negative 07/22/2010 [place] 05/30/2018 1139   BILIRUBINUR Neg  04/03/2012 1431   KETONESUR NEGATIVE 05/30/2018 1139   PROTEINUR NEGATIVE 08/10/2015 0957   UROBILINOGEN 0.2 05/30/2018 1139   NITRITE NEGATIVE 05/30/2018 1139   LEUKOCYTESUR MODERATE (A) 05/30/2018 1139   Sepsis Labs Invalid input(s): PROCALCITONIN,  WBC,  LACTICIDVEN Microbiology Recent Results (from the past 240 hour(s))  Respiratory Panel by RT PCR (Flu A&B, Covid) - Nasopharyngeal Swab     Status: None   Collection Time: 09/02/19  5:53 AM   Specimen: Nasopharyngeal Swab  Result Value Ref Range Status   SARS Coronavirus 2 by RT PCR NEGATIVE NEGATIVE Final    Comment: (NOTE) SARS-CoV-2 target nucleic acids are NOT DETECTED. The SARS-CoV-2 RNA is generally detectable in upper respiratoy specimens during the acute phase of infection. The lowest concentration of SARS-CoV-2 viral copies this assay can detect is 131 copies/mL. A negative result does not preclude SARS-Cov-2 infection and should not be used as the sole basis for treatment or other patient management decisions. A negative result may occur with  improper specimen collection/handling, submission of specimen other than nasopharyngeal swab, presence of viral mutation(s) within the areas targeted by this assay,  and inadequate number of viral copies (<131 copies/mL). A negative result must be combined with clinical observations, patient history, and epidemiological information. The expected result is Negative. Fact Sheet for Patients:  PinkCheek.be Fact Sheet for Healthcare Providers:  GravelBags.it This test is not yet ap proved or cleared by the [place] FDA and  has been authorized for detection and/or diagnosis of SARS-CoV-2 by FDA under an Emergency Use Authorization (EUA). This EUA will remain  in effect (meaning this test can be used) for the duration of the COVID-19 declaration under Section 564(b)(1) of the Act, 21 U.S.C. section 360bbb-3(b)(1), unless  the authorization is terminated or revoked sooner.    Influenza A by PCR NEGATIVE NEGATIVE Final   Influenza B by PCR NEGATIVE NEGATIVE Final    Comment: (NOTE) The Xpert Xpress SARS-CoV-2/FLU/RSV assay is intended as an aid in  the diagnosis of influenza from Nasopharyngeal swab specimens and  should not be used as a sole basis for treatment. Nasal washings and  aspirates are unacceptable for Xpert Xpress SARS-CoV-2/FLU/RSV  testing. Fact Sheet for Patients: PinkCheek.be Fact Sheet for Healthcare Providers: GravelBags.it This test is not yet approved or cleared by the [place] FDA and  has been authorized for detection and/or diagnosis of SARS-CoV-2 by  FDA under an Emergency Use Authorization (EUA). This EUA will remain  in effect (meaning this test can be used) for the duration of the  Covid-19 declaration under Section 564(b)(1) of the Act, 21  U.S.C. section 360bbb-3(b)(1), unless the authorization is  terminated or revoked. Performed at [facility], [place] [address] [place], [place] [address]      Time coordinating discharge: 35 minutes  SIGNED:   Jonnie Finner, DO  [facility] 09/05/2019, 12:45 PM   If 7PM-7AM, please contact night-coverage [email or web address]

## 2019-09-05 NOTE — Telephone Encounter (Signed)
    Pt c/o medication issue:  1. Name of Medication: Lisinopril  2. How are you currently taking this medication (dosage and times per day)? n/a  3. Are you having a reaction (difficulty breathing--STAT)? no  4. What is your medication issue? Daughter states the hospital advised her mother to stop taking Lisinopril. She wants to discuss if that was the right decision.  Also wants to know if patient should get the covid vaccine

## 2019-09-05 NOTE — Progress Notes (Signed)
New order to discharge patient.  PIV discontinued.  Cardiac monitoring discontinued.  AVS completed, printed, reviewed with patient and daughter, and given to daughter.  All questions answered.  Daughter states [facility] called her and informed her that they would bring up the patient's medications.  Patient to be transported to main entrance via wheelchair where daughter is waiting to take patient home.

## 2019-09-05 NOTE — Telephone Encounter (Signed)
Mychart message sent.

## 2019-09-05 NOTE — Progress Notes (Signed)
Occupational Therapy Treatment Patient Details Name: Brandi Raymond MRN: [id number] DOB: [birth year] Today's Date: 09/05/2019    History of present illness Pt is an 86 year old woman admitted with SOB requiring bipap in the ED. PMH: COPD on 2L home 02, CAD, HTN, hypothyroid, s/p pacemaker on 08/21/19 due to heart block.   OT comments  Pt toileting, grooming at sink and ambulated in hall on 2L 02 with min guard to supervision. Set up for dressing. Pt is eager to go home. Continues to have decreased safety awareness, but likely baseline  Follow Up Recommendations  Home health OT    Equipment Recommendations  None recommended by OT    Recommendations for Other Services      Precautions / Restrictions Precautions Precautions: Fall       Mobility Bed Mobility Overal bed mobility: Modified Independent                Transfers Overall transfer level: Needs assistance Equipment used: Rolling walker (2 wheeled) Transfers: Sit to/from [facility] Sit to Stand: Supervision Stand pivot transfers: Supervision       General transfer comment: supervision for safety and lines, cues for hand placement    Balance     Sitting balance-Leahy Scale: Good       Standing balance-Leahy Scale: Fair                             ADL either performed or assessed with clinical judgement   ADL Overall ADL's : Needs assistance/impaired     Grooming: Wash/dry hands;Brushing hair;Standing;Supervision/safety           Upper Body Dressing : Set up;Sitting   Lower Body Dressing: Set up;Sitting/lateral leans   Toilet Transfer: Supervision/safety;Stand-pivot;BSC   Toileting- Clothing Manipulation and Hygiene: Supervision/safety;Sit to/from stand       Functional mobility during ADLs: Passenger transport manager     Praxis      Cognition Arousal/Alertness: Awake/alert Behavior During Therapy: WFL for tasks  assessed/performed Overall Cognitive Status: Impaired/Different from baseline Area of Impairment: Memory;Safety/judgement                     Memory: Decreased short-term memory   Safety/Judgement: Decreased awareness of deficits;Decreased awareness of safety              Exercises     Shoulder Instructions       General Comments      Pertinent Vitals/ Pain       Pain Assessment: No/denies pain  Home Living                                          Prior Functioning/Environment              Frequency  Min 2X/week        Progress Toward Goals  OT Goals(current goals can now be found in the care plan section)  Progress towards OT goals: Progressing toward goals  Acute Rehab OT Goals Patient Stated Goal: to go home OT Goal Formulation: With patient Time For Goal Achievement: 09/17/19 Potential to Achieve Goals: Good  Plan Discharge plan remains appropriate    Co-evaluation  AM-PAC OT "6 Clicks" Daily Activity     Outcome Measure   Help from another person eating meals?: None Help from another person taking care of personal grooming?: A Little Help from another person toileting, which includes using toliet, bedpan, or urinal?: A Little Help from another person bathing (including washing, rinsing, drying)?: A Little Help from another person to put on and taking off regular upper body clothing?: None Help from another person to put on and taking off regular lower body clothing?: A Little 6 Click Score: 20    End of Session Equipment Utilized During Treatment: Oxygen;Gait belt;Rolling walker(2L)  OT Visit Diagnosis: Unsteadiness on feet (R26.81);Other abnormalities of gait and mobility (R26.89);Other symptoms and signs involving cognitive function   Activity Tolerance Patient tolerated treatment well   Patient Left in chair;with call bell/phone within reach   Nurse Communication          Time:  6767-2094 OT Time Calculation (min): 24 min  Charges: OT General Charges $OT Visit: 1 Visit OT Treatments $Self Care/Home Management : 23-37 mins  Nestor Lewandowsky, OTR/L Acute Rehabilitation Services Pager: [phone] Office: [phone]   Malka So 09/05/2019, 12:38 PM

## 2019-09-05 NOTE — Telephone Encounter (Signed)
Lisinopril stopped due to decreased kidney function.  If her kidney function improves when it is rechecked which it most likely will we can restart the medication.   Yes, she should take the covid vaccine.

## 2019-09-08 ENCOUNTER — Ambulatory Visit: Payer: PPO

## 2019-09-09 ENCOUNTER — Other Ambulatory Visit: Payer: Self-pay

## 2019-09-09 ENCOUNTER — Encounter: Payer: Self-pay | Admitting: Cardiovascular Disease

## 2019-09-09 ENCOUNTER — Ambulatory Visit: Payer: PPO | Admitting: Cardiovascular Disease

## 2019-09-09 ENCOUNTER — Telehealth: Payer: Self-pay | Admitting: Internal Medicine

## 2019-09-09 VITALS — BP 131/77 | HR 69 | Temp 98.0°F | Ht 64.0 in | Wt 117.0 lb

## 2019-09-09 DIAGNOSIS — I2510 Atherosclerotic heart disease of native coronary artery without angina pectoris: Secondary | ICD-10-CM

## 2019-09-09 DIAGNOSIS — I5031 Acute diastolic (congestive) heart failure: Secondary | ICD-10-CM | POA: Diagnosis not present

## 2019-09-09 DIAGNOSIS — I480 Paroxysmal atrial fibrillation: Secondary | ICD-10-CM

## 2019-09-09 DIAGNOSIS — I10 Essential (primary) hypertension: Secondary | ICD-10-CM

## 2019-09-09 DIAGNOSIS — I6523 Occlusion and stenosis of bilateral carotid arteries: Secondary | ICD-10-CM | POA: Diagnosis not present

## 2019-09-09 DIAGNOSIS — F17200 Nicotine dependence, unspecified, uncomplicated: Secondary | ICD-10-CM | POA: Diagnosis not present

## 2019-09-09 NOTE — Patient Instructions (Addendum)
Medication Instructions:  Your physician recommends that you continue on your current medications as directed. Please refer to the Current Medication list given to you today.  If you need a refill on your cardiac medications before your next appointment, please call your pharmacy.   Lab work: NONE  Testing/Procedures: Your physician has requested that you have a carotid duplex in November 2021. This test is an ultrasound of the carotid arteries in your neck. It looks at blood flow through these arteries that supply the brain with blood. Allow one hour for this exam. There are no restrictions or special instructions.   Follow-Up: At [facility], you and your health needs are our priority.  As part of our continuing mission to provide you with exceptional heart care, we have created designated Provider Care Teams.  These Care Teams include your primary Cardiologist (physician) and Advanced Practice Providers (APPs -  Physician Assistants and Nurse Practitioners) who all work together to provide you with the care you need, when you need it. You may see Quay Burow, MD or one of the following Advanced Practice Providers on your designated Care Team:    Kerin Ransom, PA-C  [place], [place]  Coletta Memos, [place]  Your physician wants you to follow-up in: 6 months with a Physicians Assistant and 1 years with Dr, Gwenlyn Found.You will receive a reminder letter in the mail two months in advance. If you don't receive a letter, please call our office to schedule the follow-up appointment.
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
## 2019-09-09 NOTE — Assessment & Plan Note (Signed)
History of myocardial infarction back in 1993 with a negative Myoview 12/10/2014.  She does have coronary calcification on CT.  She denies chest pain.

## 2019-09-09 NOTE — Telephone Encounter (Signed)
I spoke to the patient's daughter [name]) who called because the patient has been having "blackout" episodes, 3 yesterday and one just now.  I advised her to call EMS and go to the ED for further evaluation.  She verbalized understanding.

## 2019-09-09 NOTE — Assessment & Plan Note (Signed)
Recent 2D echo revealed normal LV systolic function 75/1/02.  She is on oral diuretic.  She does not appear wet today.

## 2019-09-09 NOTE — Assessment & Plan Note (Signed)
History of 2-1 heart block when I saw her in the office 08/08/2019 status post dual-chamber pacemaker insertion by Dr. Rayann Heman approximately 2 weeks later on 08/21/2019.  Her heart rate today 69

## 2019-09-09 NOTE — Assessment & Plan Note (Signed)
Over 60-pack-year tobacco abuse having recently discontinued in January of this year.  She does have COPD/emphysema on continuous supplemental oxygen.

## 2019-09-09 NOTE — Telephone Encounter (Signed)
New Message     Pts daughter is calling stating a weird episode is happening with the pt.  She says around 330pm the pt was standing up, walking from her room to the living room. Pt  stopped and looked blank, like she blacked out. Pts daughter said she had to basically drag her to go sit down. She said it lasted about 5 minutes before she came too and does not remember this happening.   Pts daughter said this Happened 3 times yesterday, the pt does not remember any of the episodes happening, and one time she was by herself and had fallen.  She is concerned she is having a stroke  And would like for her monitor to be checked     Please call

## 2019-09-09 NOTE — Assessment & Plan Note (Signed)
History of right carotid endarterectomy in January 2017 with carotid Dopplers performed 06/09/2019 revealing moderate right ICA stenosis and moderate left ICA stenosis which will be followed on an annual basis.  We will recheck carotid Dopplers in November.

## 2019-09-09 NOTE — Assessment & Plan Note (Signed)
History of essential hypertension blood pressure measured today at 131/77.  She is not on antihypertensive medications.

## 2019-09-09 NOTE — Assessment & Plan Note (Signed)
History of PAF recently demonstrated from pacemaker interrogation placed on Eliquis oral anticoagulation.  She is scheduled to see the [facility]. fib clinic tomorrow.  She is high fall risk and apparently has fallen several times at home for unclear reasons.  Determination will need to be made tomorrow about risk benefit ratio of anticoagulation.

## 2019-09-10 ENCOUNTER — Inpatient Hospital Stay: Admission: AD | Admit: 2019-09-10 | Payer: PPO | Source: Ambulatory Visit | Admitting: Cardiovascular Disease

## 2019-09-10 ENCOUNTER — Observation Stay (HOSPITAL_COMMUNITY)
Admission: EM | Admit: 2019-09-10 | Discharge: 2019-09-11 | Disposition: A | Discharge status: Home / Self Care | Payer: PPO | Attending: Internal Medicine | Admitting: Internal Medicine

## 2019-09-10 ENCOUNTER — Other Ambulatory Visit: Payer: Self-pay

## 2019-09-10 ENCOUNTER — Encounter (HOSPITAL_COMMUNITY): Payer: Self-pay

## 2019-09-10 ENCOUNTER — Ambulatory Visit: Payer: PPO | Admitting: Physician Assistant

## 2019-09-10 ENCOUNTER — Emergency Department (HOSPITAL_COMMUNITY): Payer: PPO

## 2019-09-10 VITALS — BP 124/62 | HR 67 | Ht 64.0 in | Wt 117.0 lb

## 2019-09-10 DIAGNOSIS — E039 Hypothyroidism, unspecified: Secondary | ICD-10-CM | POA: Insufficient documentation

## 2019-09-10 DIAGNOSIS — I5030 Unspecified diastolic (congestive) heart failure: Secondary | ICD-10-CM | POA: Diagnosis not present

## 2019-09-10 DIAGNOSIS — I2510 Atherosclerotic heart disease of native coronary artery without angina pectoris: Secondary | ICD-10-CM | POA: Insufficient documentation

## 2019-09-10 DIAGNOSIS — Z85118 Personal history of other malignant neoplasm of bronchus and lung: Secondary | ICD-10-CM | POA: Insufficient documentation

## 2019-09-10 DIAGNOSIS — E785 Hyperlipidemia, unspecified: Secondary | ICD-10-CM | POA: Insufficient documentation

## 2019-09-10 DIAGNOSIS — Z950 Presence of cardiac pacemaker: Secondary | ICD-10-CM | POA: Diagnosis not present

## 2019-09-10 DIAGNOSIS — J449 Chronic obstructive pulmonary disease, unspecified: Secondary | ICD-10-CM | POA: Diagnosis not present

## 2019-09-10 DIAGNOSIS — Z9861 Coronary angioplasty status: Secondary | ICD-10-CM | POA: Diagnosis not present

## 2019-09-10 DIAGNOSIS — R55 Syncope and collapse: Secondary | ICD-10-CM | POA: Diagnosis not present

## 2019-09-10 DIAGNOSIS — I4892 Unspecified atrial flutter: Secondary | ICD-10-CM | POA: Diagnosis not present

## 2019-09-10 DIAGNOSIS — E860 Dehydration: Secondary | ICD-10-CM | POA: Diagnosis not present

## 2019-09-10 DIAGNOSIS — Z20822 Contact with and (suspected) exposure to covid-19: Secondary | ICD-10-CM | POA: Insufficient documentation

## 2019-09-10 DIAGNOSIS — I110 Hypertensive heart disease with heart failure: Secondary | ICD-10-CM | POA: Diagnosis not present

## 2019-09-10 DIAGNOSIS — Z87891 Personal history of nicotine dependence: Secondary | ICD-10-CM | POA: Insufficient documentation

## 2019-09-10 DIAGNOSIS — R079 Chest pain, unspecified: Secondary | ICD-10-CM

## 2019-09-10 DIAGNOSIS — I480 Paroxysmal atrial fibrillation: Secondary | ICD-10-CM | POA: Diagnosis not present

## 2019-09-10 LAB — BASIC METABOLIC PANEL
Anion gap: 15 (ref 5–15)
BUN: 49 mg/dL — ABNORMAL HIGH (ref 8–23)
CO2: 28 mmol/L (ref 22–32)
Calcium: 9.7 mg/dL (ref 8.9–10.3)
Chloride: 91 mmol/L — ABNORMAL LOW (ref 98–111)
Creatinine, Ser: 1.9 mg/dL — ABNORMAL HIGH (ref 0.44–1.00)
GFR calc Af Amer: 28 mL/min — ABNORMAL LOW (ref >60)
GFR calc non Af Amer: 24 mL/min — ABNORMAL LOW (ref >60)
Glucose, Bld: 163 mg/dL — ABNORMAL HIGH (ref 70–99)
Potassium: 4.1 mmol/L (ref 3.5–5.1)
Sodium: 134 mmol/L — ABNORMAL LOW (ref 135–145)

## 2019-09-10 LAB — CBG MONITORING, ED: Glucose-Capillary: 120 mg/dL — ABNORMAL HIGH (ref 70–99)

## 2019-09-10 LAB — CBC
HCT: 44.2 % (ref 36.0–46.0)
Hemoglobin: 13.5 g/dL (ref 12.0–15.0)
MCH: 27.1 pg (ref 26.0–34.0)
MCHC: 30.5 g/dL (ref 30.0–36.0)
MCV: 88.8 fL (ref 80.0–100.0)
Platelets: 367 10*3/uL (ref 150–400)
RBC: 4.98 MIL/uL (ref 3.87–5.11)
RDW: 14.8 % (ref 11.5–15.5)
WBC: 13.2 10*3/uL — ABNORMAL HIGH (ref 4.0–10.5)
nRBC: 0 % (ref 0.0–0.2)

## 2019-09-10 LAB — TROPONIN I (HIGH SENSITIVITY)
Troponin I (High Sensitivity): 26 ng/L — ABNORMAL HIGH (ref <18)
Troponin I (High Sensitivity): 36 ng/L — ABNORMAL HIGH (ref <18)

## 2019-09-10 LAB — SARS CORONAVIRUS 2 (TAT 6-24 HRS): SARS Coronavirus 2: NEGATIVE

## 2019-09-10 MED ORDER — DENOSUMAB 60 MG/ML ~~LOC~~ SOSY: 60.0000 mg | PREFILLED_SYRINGE | SUBCUTANEOUS | Status: DC

## 2019-09-10 MED ORDER — GABAPENTIN 300 MG PO CAPS
300.0000 mg | ORAL_CAPSULE | Freq: Two times a day (BID) | ORAL | Status: DC
  Administered 2019-09-11: 300 mg via ORAL
  Filled 2019-09-10: qty 1

## 2019-09-10 MED ORDER — GABAPENTIN 300 MG PO CAPS: 300.0000 mg | ORAL_CAPSULE | ORAL | Status: DC

## 2019-09-10 MED ORDER — SODIUM CHLORIDE 0.9 % IV BOLUS
500.0000 mL | Freq: Once | INTRAVENOUS | Status: AC
  Administered 2019-09-10: 500 mL via INTRAVENOUS

## 2019-09-10 MED ORDER — UMECLIDINIUM-VILANTEROL 62.5-25 MCG/INH IN AEPB
1.0000 | INHALATION_SPRAY | Freq: Every day | RESPIRATORY_TRACT | Status: DC
  Administered 2019-09-11: 1 via RESPIRATORY_TRACT
  Filled 2019-09-10: qty 14

## 2019-09-10 MED ORDER — ONDANSETRON HCL 4 MG/2ML IJ SOLN: 4.0000 mg | Freq: Four times a day (QID) | INTRAMUSCULAR | Status: DC | PRN

## 2019-09-10 MED ORDER — SODIUM CHLORIDE 0.9% FLUSH: 3.0000 mL | INTRAVENOUS | Status: DC | PRN

## 2019-09-10 MED ORDER — SODIUM CHLORIDE 0.9% FLUSH
3.0000 mL | Freq: Two times a day (BID) | INTRAVENOUS | Status: DC
  Administered 2019-09-11: 3 mL via INTRAVENOUS

## 2019-09-10 MED ORDER — CLONAZEPAM 0.5 MG PO TABS
0.5000 mg | ORAL_TABLET | Freq: Every day | ORAL | Status: DC
  Administered 2019-09-10: 0.5 mg via ORAL
  Filled 2019-09-10: qty 1

## 2019-09-10 MED ORDER — PANTOPRAZOLE SODIUM 40 MG PO TBEC
40.0000 mg | DELAYED_RELEASE_TABLET | Freq: Every day | ORAL | Status: DC
  Administered 2019-09-11: 40 mg via ORAL
  Filled 2019-09-10: qty 1

## 2019-09-10 MED ORDER — ASPIRIN EC 81 MG PO TBEC
81.0000 mg | DELAYED_RELEASE_TABLET | Freq: Every day | ORAL | Status: DC
  Administered 2019-09-11: 81 mg via ORAL
  Filled 2019-09-10: qty 1

## 2019-09-10 MED ORDER — DULOXETINE HCL 60 MG PO CPEP
60.0000 mg | ORAL_CAPSULE | Freq: Every day | ORAL | Status: DC
  Administered 2019-09-11: 60 mg via ORAL
  Filled 2019-09-10: qty 1

## 2019-09-10 MED ORDER — DOCUSATE SODIUM 100 MG PO CAPS
100.0000 mg | ORAL_CAPSULE | Freq: Two times a day (BID) | ORAL | Status: DC
  Administered 2019-09-10 – 2019-09-11 (×2): 100 mg via ORAL
  Filled 2019-09-10 (×2): qty 1

## 2019-09-10 MED ORDER — DONEPEZIL HCL 10 MG PO TABS
10.0000 mg | ORAL_TABLET | Freq: Every day | ORAL | Status: DC
  Administered 2019-09-10: 10 mg via ORAL
  Filled 2019-09-10: qty 1

## 2019-09-10 MED ORDER — LEVOTHYROXINE SODIUM 25 MCG PO TABS
125.0000 ug | ORAL_TABLET | Freq: Every day | ORAL | Status: DC
  Administered 2019-09-11: 125 ug via ORAL
  Filled 2019-09-10: qty 1

## 2019-09-10 MED ORDER — APIXABAN 2.5 MG PO TABS
2.5000 mg | ORAL_TABLET | Freq: Two times a day (BID) | ORAL | Status: DC
  Administered 2019-09-10 – 2019-09-11 (×2): 2.5 mg via ORAL
  Filled 2019-09-10 (×2): qty 1

## 2019-09-10 MED ORDER — ACETAMINOPHEN 325 MG PO TABS: 650.0000 mg | ORAL_TABLET | ORAL | Status: DC | PRN

## 2019-09-10 MED ORDER — IBUPROFEN 600 MG PO TABS: 600.0000 mg | ORAL_TABLET | Freq: Four times a day (QID) | ORAL | Status: DC | PRN

## 2019-09-10 MED ORDER — PRAVASTATIN SODIUM 40 MG PO TABS
40.0000 mg | ORAL_TABLET | Freq: Every day | ORAL | Status: DC
  Administered 2019-09-10 – 2019-09-11 (×2): 40 mg via ORAL
  Filled 2019-09-10 (×2): qty 1

## 2019-09-10 MED ORDER — SODIUM CHLORIDE 0.9 % IV SOLN: INTRAVENOUS | Status: AC

## 2019-09-10 MED ORDER — GABAPENTIN 300 MG PO CAPS
600.0000 mg | ORAL_CAPSULE | Freq: Every day | ORAL | Status: DC
  Administered 2019-09-10: 600 mg via ORAL
  Filled 2019-09-10: qty 2

## 2019-09-10 MED ORDER — SODIUM CHLORIDE 0.9 % IV SOLN: 250.0000 mL | INTRAVENOUS | Status: DC | PRN
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
## 2019-09-10 NOTE — ED Triage Notes (Signed)
Pt arrives POV for eval of multiple syncopal episodes since Monday. Daughter reports that she is having syncopal episodes on standing multiple times a day. Pt was here last week and had over 3L removed from her lungs per daughter, cardiologist sent her over here today from office appt because of repeated syncopal episodes. Denies CP/SOB/N/V

## 2019-09-10 NOTE — ED Notes (Signed)
Report called to 6e  rn t

## 2019-09-10 NOTE — ED Notes (Signed)
I attempted to call report unsuccessful  Call back in 10 minutes
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
## 2019-09-11 ENCOUNTER — Inpatient Hospital Stay: Payer: PPO | Admitting: Internal Medicine

## 2019-09-11 DIAGNOSIS — E860 Dehydration: Secondary | ICD-10-CM | POA: Diagnosis not present

## 2019-09-11 DIAGNOSIS — R55 Syncope and collapse: Secondary | ICD-10-CM | POA: Diagnosis not present

## 2019-09-11 DIAGNOSIS — J441 Chronic obstructive pulmonary disease with (acute) exacerbation: Secondary | ICD-10-CM | POA: Diagnosis not present

## 2019-09-11 LAB — BASIC METABOLIC PANEL
Anion gap: 10 (ref 5–15)
BUN: 40 mg/dL — ABNORMAL HIGH (ref 8–23)
CO2: 30 mmol/L (ref 22–32)
Calcium: 8.5 mg/dL — ABNORMAL LOW (ref 8.9–10.3)
Chloride: 99 mmol/L (ref 98–111)
Creatinine, Ser: 1.77 mg/dL — ABNORMAL HIGH (ref 0.44–1.00)
GFR calc Af Amer: 30 mL/min — ABNORMAL LOW (ref >60)
GFR calc non Af Amer: 26 mL/min — ABNORMAL LOW (ref >60)
Glucose, Bld: 103 mg/dL — ABNORMAL HIGH (ref 70–99)
Potassium: 3.6 mmol/L (ref 3.5–5.1)
Sodium: 139 mmol/L (ref 135–145)

## 2019-09-11 LAB — URINALYSIS, ROUTINE W REFLEX MICROSCOPIC
Bilirubin Urine: NEGATIVE
Glucose, UA: NEGATIVE mg/dL
Hgb urine dipstick: NEGATIVE
Ketones, ur: NEGATIVE mg/dL
Nitrite: NEGATIVE
Protein, ur: NEGATIVE mg/dL
Specific Gravity, Urine: 1.011 (ref 1.005–1.030)
pH: 6 (ref 5.0–8.0)

## 2019-09-11 MED ORDER — METOPROLOL TARTRATE 25 MG PO TABS
25.0000 mg | ORAL_TABLET | Freq: Every day | ORAL | Status: DC
  Administered 2019-09-11: 25 mg via ORAL
  Filled 2019-09-11: qty 1

## 2019-09-11 MED ORDER — METOPROLOL TARTRATE 25 MG PO TABS: 25.0000 mg | ORAL_TABLET | Freq: Every day | ORAL | 6 refills | Status: DC

## 2019-09-11 MED ORDER — FUROSEMIDE 40 MG PO TABS: ORAL_TABLET | ORAL | 1 refills | Status: DC

## 2019-09-11 NOTE — Care Management (Addendum)
09-11-19 1026 Case Manager received consult to for [facility] referral- prior to arrival patient was from home alone with the support of daughter. On last admission- patient was supposed to be set up with [facility] was expensive for services and the agency was declined. Case Manger did call [facility] and the Liaison did call [facility] for services. [facility] will contact the patient with a visit time for services. Portable oxygen tank will be delivered to the room along with the 3n1. No further needs from this Case Manager at this time. Bethena Roys, RN,BSN    09-11-19 142 3n1 to be delivered to the home. Bethena Roys

## 2019-09-11 NOTE — Discharge Instructions (Signed)
Do NOT take any lasix until 3/1, Monday.    Weight patient every morning after voiding and before eating breakfast.  Tomorrow mornings weight (09/12/2019) should be her baseline weight.    We will adjust her diuretics further as needed based on her weights when we follow up with her next week.   If patient becomes lightheaded again with weight loss, hold lasix and call.
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
## 2019-09-11 NOTE — Plan of Care (Signed)

## 2019-09-12 ENCOUNTER — Telehealth: Payer: Self-pay | Admitting: *Deleted

## 2019-09-12 NOTE — Telephone Encounter (Signed)
Pt was on TCM report admitted 09/10/19 with syncope and + orthostasis. Found to be dehydrated with AKI. Cr 1.1 -> 1.9. After 500 ml bolus and 50 ml/hr over night, pt able to move around the room and toilet/transfer without any additional symptoms.  They were monitored on telemetry overnight which demonstrated V pacing with paroxysmal atrial fibrillation. Started on Lopressor 25 mg daily. The patient was examined by Dr. Rayann Heman and considered to be stable for discharge  09/11/19, and p will follow=up w/Dr. Chalmers Cater  for  Shirley Friar, PA-C Follow up on 09/17/2019.   Specialty: Physician Assistant

## 2019-09-12 NOTE — Addendum Note (Signed)
Addended by: Claude Manges on: 09/12/2019 04:17 PM   Modules accepted: Orders

## 2019-09-15 ENCOUNTER — Telehealth: Payer: Self-pay | Admitting: Pulmonary Disease

## 2019-09-15 NOTE — Telephone Encounter (Signed)
The sats in the problem list should qualify pt  LMTCB for Beverlee Nims at the number given

## 2019-09-16 DIAGNOSIS — Z9181 History of falling: Secondary | ICD-10-CM | POA: Diagnosis not present

## 2019-09-16 DIAGNOSIS — I110 Hypertensive heart disease with heart failure: Secondary | ICD-10-CM | POA: Diagnosis not present

## 2019-09-16 DIAGNOSIS — I441 Atrioventricular block, second degree: Secondary | ICD-10-CM | POA: Diagnosis not present

## 2019-09-16 DIAGNOSIS — F0390 Unspecified dementia without behavioral disturbance: Secondary | ICD-10-CM | POA: Diagnosis not present

## 2019-09-16 DIAGNOSIS — Z950 Presence of cardiac pacemaker: Secondary | ICD-10-CM | POA: Diagnosis not present

## 2019-09-16 DIAGNOSIS — Z87891 Personal history of nicotine dependence: Secondary | ICD-10-CM | POA: Diagnosis not present

## 2019-09-16 DIAGNOSIS — E039 Hypothyroidism, unspecified: Secondary | ICD-10-CM | POA: Diagnosis not present

## 2019-09-16 DIAGNOSIS — J9611 Chronic respiratory failure with hypoxia: Secondary | ICD-10-CM | POA: Diagnosis not present

## 2019-09-16 DIAGNOSIS — K219 Gastro-esophageal reflux disease without esophagitis: Secondary | ICD-10-CM | POA: Diagnosis not present

## 2019-09-16 DIAGNOSIS — I4891 Unspecified atrial fibrillation: Secondary | ICD-10-CM | POA: Diagnosis not present

## 2019-09-16 DIAGNOSIS — K551 Chronic vascular disorders of intestine: Secondary | ICD-10-CM | POA: Diagnosis not present

## 2019-09-16 DIAGNOSIS — Z9981 Dependence on supplemental oxygen: Secondary | ICD-10-CM | POA: Diagnosis not present

## 2019-09-16 DIAGNOSIS — Z7901 Long term (current) use of anticoagulants: Secondary | ICD-10-CM | POA: Diagnosis not present

## 2019-09-16 DIAGNOSIS — I5021 Acute systolic (congestive) heart failure: Secondary | ICD-10-CM | POA: Diagnosis not present

## 2019-09-16 DIAGNOSIS — I2510 Atherosclerotic heart disease of native coronary artery without angina pectoris: Secondary | ICD-10-CM | POA: Diagnosis not present

## 2019-09-16 DIAGNOSIS — J441 Chronic obstructive pulmonary disease with (acute) exacerbation: Secondary | ICD-10-CM | POA: Diagnosis not present

## 2019-09-16 DIAGNOSIS — E785 Hyperlipidemia, unspecified: Secondary | ICD-10-CM | POA: Diagnosis not present

## 2019-09-16 DIAGNOSIS — I6522 Occlusion and stenosis of left carotid artery: Secondary | ICD-10-CM | POA: Diagnosis not present

## 2019-09-16 NOTE — Telephone Encounter (Signed)
ATC Diana with [employer] and waited on hold for several minutes and kept getting disconnected after being on hold. I left a voicemail to have someone call back. I have printed the documentation and the office note from 05/21/2019. I will re-fax this information once I get a valid fax number that will go to billing.
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
## 2019-09-17 ENCOUNTER — Other Ambulatory Visit: Payer: Self-pay

## 2019-09-17 ENCOUNTER — Encounter: Payer: Self-pay | Admitting: Student

## 2019-09-17 ENCOUNTER — Ambulatory Visit: Payer: PPO | Admitting: Student

## 2019-09-17 VITALS — BP 118/78 | HR 60 | Ht 64.0 in | Wt 124.0 lb

## 2019-09-17 DIAGNOSIS — I480 Paroxysmal atrial fibrillation: Secondary | ICD-10-CM

## 2019-09-17 DIAGNOSIS — R55 Syncope and collapse: Secondary | ICD-10-CM | POA: Diagnosis not present

## 2019-09-17 DIAGNOSIS — I5030 Unspecified diastolic (congestive) heart failure: Secondary | ICD-10-CM | POA: Diagnosis not present

## 2019-09-17 DIAGNOSIS — Z79899 Other long term (current) drug therapy: Secondary | ICD-10-CM | POA: Diagnosis not present

## 2019-09-17 DIAGNOSIS — I10 Essential (primary) hypertension: Secondary | ICD-10-CM

## 2019-09-17 LAB — BASIC METABOLIC PANEL
BUN/Creatinine Ratio: 15 (ref 12–28)
BUN: 13 mg/dL (ref 8–27)
CO2: 27 mmol/L (ref 20–29)
Calcium: 7.8 mg/dL — ABNORMAL LOW (ref 8.7–10.3)
Chloride: 102 mmol/L (ref 96–106)
Creatinine, Ser: 0.89 mg/dL (ref 0.57–1.00)
GFR calc Af Amer: 69 mL/min/{1.73_m2} (ref >59)
GFR calc non Af Amer: 60 mL/min/{1.73_m2} (ref >59)
Glucose: 93 mg/dL (ref 65–99)
Potassium: 4.3 mmol/L (ref 3.5–5.2)
Sodium: 141 mmol/L (ref 134–144)

## 2019-09-17 NOTE — Patient Instructions (Addendum)
Medication Instructions:  none *If you need a refill on your cardiac medications before your next appointment, please call your pharmacy*   Lab Work:  TODAY  BMET If you have labs (blood work) drawn today and your tests are completely normal, you will receive your results only by: Marland Kitchen MyChart Message (if you have MyChart) OR . A paper copy in the mail If you have any lab test that is abnormal or we need to change your treatment, we will call you to review the results.   Testing/Procedures: none   Follow-Up: At [facility], you and your health needs are our priority.  As part of our continuing mission to provide you with exceptional heart care, we have created designated Provider Care Teams.  These Care Teams include your primary Cardiologist (physician) and Advanced Practice Providers (APPs -  Physician Assistants and Nurse Practitioners) who all work together to provide you with the care you need, when you need it.    Your next appointment:   2 WEEKS  The format for your next appointment:   In person  Provider:   Oda Kilts, PA   Other Instructions  If weight is less than 120#, do not take Lasix.Marland KitchenMarland Kitchen  If weight is 125 or greater take the Lasix 40 mg

## 2019-09-17 NOTE — Telephone Encounter (Signed)
Called [employer] and spoke with Adriane. She states she cannot locate where the request was needed but will forward this request to Dimas Chyle and see if anything further is needed. Adriane is aware of the most recent walk from 05/21/19 and her sats. Will keep message open to assure nothing further is needed.

## 2019-09-18 ENCOUNTER — Ambulatory Visit: Payer: PPO | Admitting: Physician Assistant

## 2019-09-24 ENCOUNTER — Other Ambulatory Visit: Payer: Self-pay | Admitting: Pulmonary Disease

## 2019-09-25 DIAGNOSIS — J9611 Chronic respiratory failure with hypoxia: Secondary | ICD-10-CM | POA: Diagnosis not present

## 2019-09-25 DIAGNOSIS — Z950 Presence of cardiac pacemaker: Secondary | ICD-10-CM | POA: Diagnosis not present

## 2019-09-25 DIAGNOSIS — Z9981 Dependence on supplemental oxygen: Secondary | ICD-10-CM | POA: Diagnosis not present

## 2019-09-25 DIAGNOSIS — Z87891 Personal history of nicotine dependence: Secondary | ICD-10-CM | POA: Diagnosis not present

## 2019-09-25 DIAGNOSIS — K219 Gastro-esophageal reflux disease without esophagitis: Secondary | ICD-10-CM | POA: Diagnosis not present

## 2019-09-25 DIAGNOSIS — I441 Atrioventricular block, second degree: Secondary | ICD-10-CM | POA: Diagnosis not present

## 2019-09-25 DIAGNOSIS — Z7901 Long term (current) use of anticoagulants: Secondary | ICD-10-CM | POA: Diagnosis not present

## 2019-09-25 DIAGNOSIS — I4891 Unspecified atrial fibrillation: Secondary | ICD-10-CM | POA: Diagnosis not present

## 2019-09-25 DIAGNOSIS — I2510 Atherosclerotic heart disease of native coronary artery without angina pectoris: Secondary | ICD-10-CM | POA: Diagnosis not present

## 2019-09-25 DIAGNOSIS — E039 Hypothyroidism, unspecified: Secondary | ICD-10-CM | POA: Diagnosis not present

## 2019-09-25 DIAGNOSIS — Z9181 History of falling: Secondary | ICD-10-CM | POA: Diagnosis not present

## 2019-09-25 DIAGNOSIS — J441 Chronic obstructive pulmonary disease with (acute) exacerbation: Secondary | ICD-10-CM | POA: Diagnosis not present

## 2019-09-25 DIAGNOSIS — K551 Chronic vascular disorders of intestine: Secondary | ICD-10-CM | POA: Diagnosis not present

## 2019-09-25 DIAGNOSIS — I6522 Occlusion and stenosis of left carotid artery: Secondary | ICD-10-CM | POA: Diagnosis not present

## 2019-09-25 DIAGNOSIS — F0390 Unspecified dementia without behavioral disturbance: Secondary | ICD-10-CM | POA: Diagnosis not present

## 2019-09-25 DIAGNOSIS — I110 Hypertensive heart disease with heart failure: Secondary | ICD-10-CM | POA: Diagnosis not present

## 2019-09-25 DIAGNOSIS — E785 Hyperlipidemia, unspecified: Secondary | ICD-10-CM | POA: Diagnosis not present

## 2019-09-25 DIAGNOSIS — I5021 Acute systolic (congestive) heart failure: Secondary | ICD-10-CM | POA: Diagnosis not present

## 2019-09-26 ENCOUNTER — Other Ambulatory Visit: Payer: Self-pay

## 2019-09-26 ENCOUNTER — Encounter: Payer: Self-pay | Admitting: Adult Health

## 2019-09-26 ENCOUNTER — Telehealth: Payer: PPO | Admitting: Adult Health

## 2019-09-26 ENCOUNTER — Ambulatory Visit: Payer: PPO | Admitting: Adult Health

## 2019-09-26 VITALS — BP 110/66 | HR 84 | Temp 97.0°F | Ht 64.0 in | Wt 124.8 lb

## 2019-09-26 DIAGNOSIS — J439 Emphysema, unspecified: Secondary | ICD-10-CM

## 2019-09-26 DIAGNOSIS — C3491 Malignant neoplasm of unspecified part of right bronchus or lung: Secondary | ICD-10-CM | POA: Diagnosis not present

## 2019-09-26 DIAGNOSIS — J9611 Chronic respiratory failure with hypoxia: Secondary | ICD-10-CM

## 2019-09-26 MED ORDER — ANORO ELLIPTA 62.5-25 MCG/INH IN AEPB: INHALATION_SPRAY | RESPIRATORY_TRACT | 5 refills | Status: DC

## 2019-09-26 NOTE — Progress Notes (Signed)
@Patient  ID: Brandi Raymond, female    DOB: 09-19-1933, 86 y.o.   MRN: [id number]  Chief Complaint  Patient presents with  . Follow-up    COPD     Referring provider: Binnie Rail, MD  HPI: 86 year old female former smoker followed for COPD, emphysema and chronic hypoxic respiratory failure on oxygen at home Medical history significant for stage Ia squamous cell carcinoma status post SBRT diagnosed in March 2020.,  Coronary artery disease, peripheral vascular disease bradycardia status post pacemaker, A Fib  TEST/EVENTS :  CT chest 09/03/2018- lobulated mass in the superior segment of the right lower lobe measuring 2 cm.  Atherosclerosis, emphysema.  PET scan 09/25/18-2 cm right lower lobe pulmonary nodule is hypermetabolic.  Scattered bilateral soft 8 mm ill-defined lung nodules with low uptake. I have reviewed the images personally.  Labs: CT-guided biopsy 10/14/2018-Poorly differentiated squamous cell cancer  CBC 09/17/2018-WBC 11.3, eos 1%, absolute eosinophil count 113  09/26/2019 Follow up : COPD , O2 RF  Patient presents for a follow-up visit.  Last seen May 2020.  Patient has underlying severe COPD and ox dependent respiratory failure.  She is had multiple hospitalizations over the last 6 weeks for dehydration, CHF exacerbation, heart block requiring pacemaker implantation.  Says she is starting to feel better but weak. Starting PT at home .  She has Moderate COPD on ANORO . Denies flare of cough . Gets winded with activity . Has low activity tolerance.  PFTs May 21, 2019 showed moderate COPD with FEV1 at 53%, ratio 54, FVC 72%, no significant bronchodilator response.  Positive mid flow reversibility and obstruction, DLCO 39% She is on Oxygen 2l/m . Would like POC to help be more mobile. Hard to do anything with oxygen tanks.   Has know lung cancer s/p SBRT. Has upcoming CT chest in couple of months.  No hemoptysis or chest pain .    Allergies  Allergen Reactions   . Silver Sulfadiazine     lowers white blood count  ; applied for burns @ [place]     Immunization History  Administered Date(s) Administered  . Fluad Quad(high Dose 65+) 04/18/2019  . Influenza, High Dose Seasonal PF 04/09/2014, 06/23/2015, 06/11/2017, 05/10/2018  . Influenza, Seasonal, Injecte, Preservative Fre 06/20/2012  . Influenza,inj,Quad PF,6+ Mos 05/22/2013  . Pneumococcal Conjugate-13 12/06/2016  . Pneumococcal Polysaccharide-23 09/26/2013, 08/13/2015  . Tdap 11/13/2012    Past Medical History:  Diagnosis Date  . Acute MI inferior subsequent episode care [facility]) 1993   PTCA RCA  . Adenomatous colon polyp   . Arthritis   . CAD (coronary artery disease)    Dr Percival Spanish  . Carotid artery occlusion   . Cervical spine fracture ([place])   . COPD (chronic obstructive pulmonary disease) ([place])   . Depression   . Diverticulosis   . Dyslipidemia   . Gastritis 09/15/1991  . GERD (gastroesophageal reflux disease) 09/15/1991   Dr Sharlett Iles  . Hiatal hernia 09/15/1991  . Hip fracture, right ([place])   . HLD (hyperlipidemia)   . HTN (hypertension)   . Hyperplastic polyps of stomach 11/2007   colonoscopy  . Hypertension   . Hypothyroidism    affecting the left eye, proptosis  . Iron deficiency anemia   . Lung cancer ([place])    s/p XRT  . Macular degeneration of left eye   . Memory loss   . Mesenteric artery stenosis ([place])   . Myasthenia gravis ([place])    With ocular features  .  Myocardial infarction ([place]) 1993  . Ocular myasthenia gravis ([place])    Dr Jannifer Franklin  . Orthostatic hypotension 06/05/2013  . PONV (postoperative nausea and vomiting)   . Strabismus    left eye  . Syncope 1998    Tobacco History: Social History   Tobacco Use  Smoking Status Former Smoker  . Packs/day: 1.00  . Years: 60.00  . Pack years: 60.00  . Types: Cigarettes  . Quit date: 07/2019  . Years since quitting: 0.1  Smokeless Tobacco Never Used  Tobacco Comment   Successfully quit   Counseling  given: Not Answered Comment: Successfully quit   Outpatient Medications Prior to Visit  Medication Sig Dispense Refill  . acetaminophen (TYLENOL) 500 MG tablet Take 500 mg by mouth every 6 (six) hours as needed for mild pain.    Marland Kitchen apixaban (ELIQUIS) 2.5 MG TABS tablet Take 1 tablet (2.5 mg total) by mouth 2 (two) times daily. 60 tablet 1  . Calcium-Magnesium-Vitamin D (CALCIUM 1200+D3 PO) Take 1 tablet by mouth daily.    . clonazePAM (KLONOPIN) 0.5 MG tablet TAKE 1 TABLET BY MOUTH EVERYDAY AT BEDTIME (Patient taking differently: Take 0.5 mg by mouth at bedtime. ) 30 tablet 0  . docusate sodium (COLACE) 100 MG capsule Take 100 mg by mouth 2 (two) times daily.    Marland Kitchen donepezil (ARICEPT) 10 MG tablet TAKE 1 TABLET BY MOUTH EVERYDAY AT BEDTIME (Patient taking differently: Take 10 mg by mouth daily with supper. ) 90 tablet 3  . DULoxetine (CYMBALTA) 60 MG capsule TAKE 1 CAPSULE BY MOUTH EVERY DAY (Patient taking differently: Take 60 mg by mouth daily. ) 90 capsule 2  . furosemide (LASIX) 40 MG tablet Take 1 tablet (40 mg) by mouth on Mondays and Fridays starting 3/1. Hold for lightheadedness and call. 30 tablet 1  . gabapentin (NEURONTIN) 300 MG capsule TAKE ONE CAPSULE TWICE DAILY AND 2 AT NIGHT = FOUR TOTAL DAILY. (Patient taking differently: Take 300-600 mg by mouth See admin instructions. TAKE ONE CAPSULE TWICE DAILY AND 2 AT NIGHT.) 360 capsule 1  . ibuprofen (ADVIL) 200 MG tablet Take 600 mg by mouth every 6 (six) hours as needed for headache or moderate pain.    Marland Kitchen levothyroxine (SYNTHROID) 125 MCG tablet TAKE 1 TABLET (125 MCG TOTAL) BY MOUTH DAILY BEFORE BREAKFAST. (Patient taking differently: Take 125 mcg by mouth daily. ) 90 tablet 1  . metoprolol tartrate (LOPRESSOR) 25 MG tablet Take 1 tablet (25 mg total) by mouth daily. 30 tablet 6  . Multiple Vitamins-Minerals (PRESERVISION AREDS 2 PO) Take 1 capsule by mouth 2 (two) times daily.    . pantoprazole (PROTONIX) 40 MG tablet Take 1 tablet (40  mg total) by mouth daily. 90 tablet 1  . potassium chloride (KLOR-CON) 10 MEQ tablet Take 1 tablet (10 mEq total) by mouth daily. 30 tablet 1  . pravastatin (PRAVACHOL) 40 MG tablet TAKE 1 TABLET BY MOUTH EVERY DAY (Patient taking differently: Take 40 mg by mouth every evening. ) 90 tablet 2  . PROLIA 60 MG/ML SOSY injection Inject 60 mg into the skin every 6 (six) months.     Jearl Klinefelter ELLIPTA 62.5-25 MCG/INH AEPB INHALE 1 PUFF BY MOUTH EVERY DAY 60 each 0   No facility-administered medications prior to visit.     Review of Systems:   Constitutional:   No  weight loss, night sweats,  Fevers, chills +, fatigue, or  lassitude.  HEENT:   No headaches,  Difficulty swallowing,  Tooth/dental  problems, or  Sore throat,                No sneezing, itching, ear ache, nasal congestion, post nasal drip,   CV:  No chest pain,  Orthopnea, PND, swelling in lower extremities, anasarca, dizziness, palpitations, syncope.   GI  No heartburn, indigestion, abdominal pain, nausea, vomiting, diarrhea, change in bowel habits, loss of appetite, bloody stools.   Resp: .  No chest wall deformity  Skin: no rash or lesions.  GU: no dysuria, change in color of urine, no urgency or frequency.  No flank pain, no hematuria   MS:  No joint pain or swelling.  No decreased range of motion.  No back pain.    Physical Exam  BP 110/66 (BP Location: Right Arm, Cuff Size: Normal)   Pulse 84   Temp (!) 97 F (36.1 C) (Temporal)   Ht 5\' 4"  (1.626 m)   Wt 124 lb 12.8 oz (56.6 kg)   SpO2 93% Comment: 2L  BMI 21.42 kg/m   GEN: A/Ox3; pleasant , NAD   HEENT:  [place]/AT,  EACs-clear, TMs-wnl, NOSE-clear, THROAT-clear, no lesions, no postnasal drip or exudate noted.   NECK:  Supple w/ fair ROM; no JVD; normal carotid impulses w/o bruits; no thyromegaly or nodules palpated; no lymphadenopathy.    RESP  Decreased BS in bases  no accessory muscle use, no dullness to percussion  CARD:  RRR, no m/r/g, no peripheral edema,  pulses intact, no cyanosis or clubbing.  GI:   Soft & nt; nml bowel sounds; no organomegaly or masses detected.   Musco: Warm bil, no deformities or joint swelling noted.   Neuro: alert, no focal deficits noted.    Skin: Warm, no lesions or rashes    Lab Results:  CBC  BMET    Component Value Date/Time   NA 141 09/17/2019 0940   K 4.3 09/17/2019 0940   CL 102 09/17/2019 0940   CO2 27 09/17/2019 0940   GLUCOSE 93 09/17/2019 0940   GLUCOSE 103 (H) 09/11/2019 0404   BUN 13 09/17/2019 0940   CREATININE 0.89 09/17/2019 0940   CREATININE 0.92 05/09/2019 1332   CALCIUM 7.8 (L) 09/17/2019 0940   GFRNONAA 60 09/17/2019 0940   GFRNONAA 58 (L) 05/09/2019 1332   GFRAA 69 09/17/2019 0940   GFRAA >60 05/09/2019 1332    BNP    Component Value Date/Time   BNP 885.2 (H) 09/02/2019 0520    ProBNP No results found for: PROBNP  Imaging: DG Chest Port 1 View  Result Date: 09/10/2019 CLINICAL DATA:  Multiple syncopal episodes since Monday on standing; history of orthostatic hypotension, coronary artery disease post MI, COPD, hypertension, lung cancer, pleural effusion EXAM: PORTABLE CHEST 1 VIEW COMPARISON:  Portable exam 1524 hours compared to 09/02/2019 FINDINGS: LEFT subclavian sequential transvenous pacemaker leads project at RIGHT atrium and RIGHT ventricle. Normal heart size, mediastinal contours, and pulmonary vascularity. Atherosclerotic calcification aorta. Emphysematous and bronchitic changes consistent with COPD. No acute infiltrate, pleural effusion, or pneumothorax. Bones demineralized. IMPRESSION: COPD changes. No acute abnormalities. Resolution of pulmonary edema seen on previous exam. Electronically Signed   By: Lavonia Dana M.D.   On: 09/10/2019 16:01   DG Chest Portable 1 View  Result Date: 09/02/2019 CLINICAL DATA:  Shortness of breath. Cough. EXAM: PORTABLE CHEST 1 VIEW COMPARISON:  Most recent radiograph 08/22/2019. Most recent CT 08/08/2019 FINDINGS: Two lead  left-sided pacemaker in place. Unchanged heart size and mediastinal contours. Aortic atherosclerosis. Interstitial opacities with septal  thickening and Kerley B-lines consistent with pulmonary edema. There are small to moderate bilateral pleural effusions. Previous hiatal hernia not well seen. Previous right perihilar opacity is currently obscured by pulmonary edema. No pneumothorax. IMPRESSION: Pulmonary edema, new from prior exam. Increased pleural effusions, moderate in size. Previous right perihilar opacity is obscured by pulmonary edema. Aortic Atherosclerosis (ICD10-I70.0). Electronically Signed   By: Keith Rake M.D.   On: 09/02/2019 05:21      PFT Results Latest Ref Rng & Units 05/21/2019  FVC-Pre L 1.80  FVC-Predicted Pre % 72  FVC-Post L 1.81  FVC-Predicted Post % 73  Pre FEV1/FVC % % 54  Post FEV1/FCV % % 53  FEV1-Pre L 0.98  FEV1-Predicted Pre % 53  FEV1-Post L 0.95  DLCO UNC% % 39  DLCO COR %Predicted % 55  TLC L 4.61  TLC % Predicted % 91  RV % Predicted % 109    No results found for: NITRICOXIDE      Assessment & Plan:   COPD (chronic obstructive pulmonary disease) with emphysema (HCC) Moderate COPD-has increased symptom burden with associated deconditioning.  She also has had several hospitalizations and decompensated CHF.  She seems to be slowly improving.  Highly recommend physical therapy and increase in her activity as tolerated.  Plan  Patient Instructions  Continue on ANORO 1 puff daily  Follow up for CT chest as planned.  Continue on Oxygen 2l/m .  Order for POC  Covid vaccine as able.  Follow up with Cardiology as planned  Follow up in 3 month with Dr. Vaughan Browner and As needed         Stage I squamous cell carcinoma of right lung ([place]) Status post SBRT continue with planned follow-up with oncology and serial CT chest  Chronic respiratory failure with hypoxia ([place]) Compensated on oxygen.  Continue on O2 at 2 L to keep O2 saturations greater  than 88 to 90%.  Highly recommend a portable oxygen concentrator to help her to be more mobile and help with her reconditioning.     Rexene Edison, NP 09/26/2019

## 2019-09-26 NOTE — Patient Instructions (Signed)
Continue on ANORO 1 puff daily  Follow up for CT chest as planned.  Continue on Oxygen 2l/m .  Order for POC  Covid vaccine as able.  Follow up with Cardiology as planned  Follow up in 3 month with Dr. Vaughan Browner and As needed

## 2019-09-26 NOTE — Assessment & Plan Note (Signed)
Moderate COPD-has increased symptom burden with associated deconditioning.  She also has had several hospitalizations and decompensated CHF.  She seems to be slowly improving.  Highly recommend physical therapy and increase in her activity as tolerated.  Plan  Patient Instructions  Continue on ANORO 1 puff daily  Follow up for CT chest as planned.  Continue on Oxygen 2l/m .  Order for POC  Covid vaccine as able.  Follow up with Cardiology as planned  Follow up in 3 month with Dr. Vaughan Browner and As needed

## 2019-09-26 NOTE — Assessment & Plan Note (Signed)
Status post SBRT continue with planned follow-up with oncology and serial CT chest

## 2019-09-26 NOTE — Assessment & Plan Note (Signed)
Compensated on oxygen.  Continue on O2 at 2 L to keep O2 saturations greater than 88 to 90%.  Highly recommend a portable oxygen concentrator to help her to be more mobile and help with her reconditioning.

## 2019-09-30 ENCOUNTER — Telehealth: Payer: Self-pay | Admitting: Adult Health

## 2019-09-30 NOTE — Telephone Encounter (Signed)
Attempted to call [employer] but received after hours answering service.  wcb 10/01/2019.

## 2019-10-01 ENCOUNTER — Ambulatory Visit: Payer: PPO | Admitting: Student

## 2019-10-01 ENCOUNTER — Other Ambulatory Visit: Payer: Self-pay

## 2019-10-01 ENCOUNTER — Encounter: Payer: Self-pay | Admitting: Student

## 2019-10-01 VITALS — BP 126/76 | HR 65 | Ht 64.0 in | Wt 126.5 lb

## 2019-10-01 DIAGNOSIS — I480 Paroxysmal atrial fibrillation: Secondary | ICD-10-CM

## 2019-10-01 DIAGNOSIS — I10 Essential (primary) hypertension: Secondary | ICD-10-CM | POA: Diagnosis not present

## 2019-10-01 DIAGNOSIS — I5031 Acute diastolic (congestive) heart failure: Secondary | ICD-10-CM

## 2019-10-01 DIAGNOSIS — Z79899 Other long term (current) drug therapy: Secondary | ICD-10-CM

## 2019-10-01 LAB — BASIC METABOLIC PANEL
BUN/Creatinine Ratio: 18 (ref 12–28)
BUN: 18 mg/dL (ref 8–27)
CO2: 28 mmol/L (ref 20–29)
Calcium: 8.8 mg/dL (ref 8.7–10.3)
Chloride: 99 mmol/L (ref 96–106)
Creatinine, Ser: 1.02 mg/dL — ABNORMAL HIGH (ref 0.57–1.00)
GFR calc Af Amer: 58 mL/min/{1.73_m2} — ABNORMAL LOW (ref >59)
GFR calc non Af Amer: 51 mL/min/{1.73_m2} — ABNORMAL LOW (ref >59)
Glucose: 93 mg/dL (ref 65–99)
Potassium: 4.8 mmol/L (ref 3.5–5.2)
Sodium: 141 mmol/L (ref 134–144)

## 2019-10-01 MED ORDER — FUROSEMIDE 40 MG PO TABS: 40.0000 mg | ORAL_TABLET | ORAL | 3 refills | Status: DC
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
## 2019-10-01 NOTE — Telephone Encounter (Addendum)
[facility] [employer] back at the number provided in the message.  On hold x5 minutes.  Patient was just seen on 3.12.21 by Tammy P and O2 recert was done at that time.  Called the local help desk number for [employer] and spoke with Corene Cornea who reported that it appears that they have everything they need and shouldn't need anything further.    Will sign off.

## 2019-10-01 NOTE — Patient Instructions (Addendum)
Medication Instructions:  TAKE LASIX 40 mg EVERY OTHER DAY *If you need a refill on your cardiac medications before your next appointment, please call your pharmacy*   Lab Work:  TODAY BMET If you have labs (blood work) drawn today and your tests are completely normal, you will receive your results only by: Marland Kitchen MyChart Message (if you have MyChart) OR . A paper copy in the mail If you have any lab test that is abnormal or we need to change your treatment, we will call you to review the results.   Testing/Procedures: NONE   Follow-Up: KEEP APPOINTMENT WITH Dr Rayann Heman  11/24/19 At [facility], you and your health needs are our priority.  As part of our continuing mission to provide you with exceptional heart care, we have created designated Provider Care Teams.  These Care Teams include your primary Cardiologist (physician) and Advanced Practice Providers (APPs -  Physician Assistants and Nurse Practitioners) who all work together to provide you with the care you need, when you need it.     Other Instructions  Remote monitoring is used to monitor your Pacemaker from home. This monitoring reduces the number of office visits required to check your device to one time per year. It allows [place] to keep an eye on the functioning of your device to ensure it is working properly. You are scheduled for a device check from home on 11/24/19. You may send your transmission at any time that day. If you have a wireless device, the transmission will be sent automatically. After your physician reviews your transmission, you will receive a postcard with your next transmission date.

## 2019-10-02 ENCOUNTER — Other Ambulatory Visit: Payer: Self-pay | Admitting: Internal Medicine

## 2019-10-02 ENCOUNTER — Telehealth: Payer: Self-pay

## 2019-10-02 DIAGNOSIS — I5021 Acute systolic (congestive) heart failure: Secondary | ICD-10-CM | POA: Diagnosis not present

## 2019-10-02 DIAGNOSIS — Z950 Presence of cardiac pacemaker: Secondary | ICD-10-CM | POA: Diagnosis not present

## 2019-10-02 DIAGNOSIS — Z7901 Long term (current) use of anticoagulants: Secondary | ICD-10-CM | POA: Diagnosis not present

## 2019-10-02 DIAGNOSIS — I2510 Atherosclerotic heart disease of native coronary artery without angina pectoris: Secondary | ICD-10-CM | POA: Diagnosis not present

## 2019-10-02 DIAGNOSIS — J441 Chronic obstructive pulmonary disease with (acute) exacerbation: Secondary | ICD-10-CM | POA: Diagnosis not present

## 2019-10-02 DIAGNOSIS — I6522 Occlusion and stenosis of left carotid artery: Secondary | ICD-10-CM | POA: Diagnosis not present

## 2019-10-02 DIAGNOSIS — Z9981 Dependence on supplemental oxygen: Secondary | ICD-10-CM | POA: Diagnosis not present

## 2019-10-02 DIAGNOSIS — K219 Gastro-esophageal reflux disease without esophagitis: Secondary | ICD-10-CM | POA: Diagnosis not present

## 2019-10-02 DIAGNOSIS — Z87891 Personal history of nicotine dependence: Secondary | ICD-10-CM | POA: Diagnosis not present

## 2019-10-02 DIAGNOSIS — E039 Hypothyroidism, unspecified: Secondary | ICD-10-CM | POA: Diagnosis not present

## 2019-10-02 DIAGNOSIS — I4891 Unspecified atrial fibrillation: Secondary | ICD-10-CM | POA: Diagnosis not present

## 2019-10-02 DIAGNOSIS — E785 Hyperlipidemia, unspecified: Secondary | ICD-10-CM | POA: Diagnosis not present

## 2019-10-02 DIAGNOSIS — F0390 Unspecified dementia without behavioral disturbance: Secondary | ICD-10-CM | POA: Diagnosis not present

## 2019-10-02 DIAGNOSIS — I110 Hypertensive heart disease with heart failure: Secondary | ICD-10-CM | POA: Diagnosis not present

## 2019-10-02 DIAGNOSIS — Z9181 History of falling: Secondary | ICD-10-CM | POA: Diagnosis not present

## 2019-10-02 DIAGNOSIS — K551 Chronic vascular disorders of intestine: Secondary | ICD-10-CM | POA: Diagnosis not present

## 2019-10-02 DIAGNOSIS — J9611 Chronic respiratory failure with hypoxia: Secondary | ICD-10-CM | POA: Diagnosis not present

## 2019-10-02 DIAGNOSIS — I441 Atrioventricular block, second degree: Secondary | ICD-10-CM | POA: Diagnosis not present

## 2019-10-02 MED ORDER — POTASSIUM CHLORIDE ER 10 MEQ PO TBCR: EXTENDED_RELEASE_TABLET | ORAL | 3 refills | Status: DC

## 2019-10-02 NOTE — Telephone Encounter (Signed)
-----   Message from Shirley Friar, PA-C sent at 10/02/2019  8:13 AM EDT ----- Stable labs on lasix every other day.  Her potassium is stable, and on the upper end of normal, so it is OK for her to only take potassium on days that she takes lasix.   Legrand Como [address]" [place], PA-C 10/02/2019 8:11 AM

## 2019-10-02 NOTE — Telephone Encounter (Signed)
lp's daughter [name]) message with results/recommendations 3/18.  The patient is to take Potassium only with Lasix (every other day).

## 2019-10-03 ENCOUNTER — Ambulatory Visit: Payer: PPO

## 2019-10-06 ENCOUNTER — Other Ambulatory Visit: Payer: Self-pay | Admitting: *Deleted

## 2019-10-06 MED ORDER — APIXABAN 2.5 MG PO TABS: 2.5000 mg | ORAL_TABLET | Freq: Two times a day (BID) | ORAL | 5 refills | Status: DC

## 2019-10-06 MED ORDER — POTASSIUM CHLORIDE ER 10 MEQ PO TBCR: EXTENDED_RELEASE_TABLET | ORAL | 3 refills | Status: DC

## 2019-10-06 MED ORDER — FUROSEMIDE 40 MG PO TABS: 40.0000 mg | ORAL_TABLET | ORAL | 3 refills | Status: DC

## 2019-10-06 NOTE — Telephone Encounter (Signed)
Prescription refill request for Eliquis received.  Last office visit: [place] 10/01/2019 Scr: 1.02, 10/01/2019 Age:  86 y.o. Weight: 57.4 kg   Prescription refill sent.

## 2019-10-07 DIAGNOSIS — I6522 Occlusion and stenosis of left carotid artery: Secondary | ICD-10-CM | POA: Diagnosis not present

## 2019-10-07 DIAGNOSIS — J441 Chronic obstructive pulmonary disease with (acute) exacerbation: Secondary | ICD-10-CM | POA: Diagnosis not present

## 2019-10-07 DIAGNOSIS — I5021 Acute systolic (congestive) heart failure: Secondary | ICD-10-CM | POA: Diagnosis not present

## 2019-10-07 DIAGNOSIS — K551 Chronic vascular disorders of intestine: Secondary | ICD-10-CM | POA: Diagnosis not present

## 2019-10-07 DIAGNOSIS — Z7901 Long term (current) use of anticoagulants: Secondary | ICD-10-CM | POA: Diagnosis not present

## 2019-10-07 DIAGNOSIS — J9611 Chronic respiratory failure with hypoxia: Secondary | ICD-10-CM | POA: Diagnosis not present

## 2019-10-07 DIAGNOSIS — I4891 Unspecified atrial fibrillation: Secondary | ICD-10-CM | POA: Diagnosis not present

## 2019-10-07 DIAGNOSIS — I2510 Atherosclerotic heart disease of native coronary artery without angina pectoris: Secondary | ICD-10-CM | POA: Diagnosis not present

## 2019-10-07 DIAGNOSIS — I110 Hypertensive heart disease with heart failure: Secondary | ICD-10-CM | POA: Diagnosis not present

## 2019-10-07 DIAGNOSIS — I441 Atrioventricular block, second degree: Secondary | ICD-10-CM | POA: Diagnosis not present

## 2019-10-07 DIAGNOSIS — E039 Hypothyroidism, unspecified: Secondary | ICD-10-CM | POA: Diagnosis not present

## 2019-10-07 DIAGNOSIS — Z950 Presence of cardiac pacemaker: Secondary | ICD-10-CM | POA: Diagnosis not present

## 2019-10-07 DIAGNOSIS — Z9981 Dependence on supplemental oxygen: Secondary | ICD-10-CM | POA: Diagnosis not present

## 2019-10-07 DIAGNOSIS — K219 Gastro-esophageal reflux disease without esophagitis: Secondary | ICD-10-CM | POA: Diagnosis not present

## 2019-10-07 DIAGNOSIS — F0390 Unspecified dementia without behavioral disturbance: Secondary | ICD-10-CM | POA: Diagnosis not present

## 2019-10-07 DIAGNOSIS — E785 Hyperlipidemia, unspecified: Secondary | ICD-10-CM | POA: Diagnosis not present

## 2019-10-07 DIAGNOSIS — Z87891 Personal history of nicotine dependence: Secondary | ICD-10-CM | POA: Diagnosis not present

## 2019-10-07 DIAGNOSIS — Z9181 History of falling: Secondary | ICD-10-CM | POA: Diagnosis not present

## 2019-10-08 DIAGNOSIS — I4891 Unspecified atrial fibrillation: Secondary | ICD-10-CM | POA: Diagnosis not present

## 2019-10-08 DIAGNOSIS — K219 Gastro-esophageal reflux disease without esophagitis: Secondary | ICD-10-CM | POA: Diagnosis not present

## 2019-10-08 DIAGNOSIS — E785 Hyperlipidemia, unspecified: Secondary | ICD-10-CM | POA: Diagnosis not present

## 2019-10-08 DIAGNOSIS — I2510 Atherosclerotic heart disease of native coronary artery without angina pectoris: Secondary | ICD-10-CM | POA: Diagnosis not present

## 2019-10-08 DIAGNOSIS — K551 Chronic vascular disorders of intestine: Secondary | ICD-10-CM | POA: Diagnosis not present

## 2019-10-08 DIAGNOSIS — Z9981 Dependence on supplemental oxygen: Secondary | ICD-10-CM | POA: Diagnosis not present

## 2019-10-08 DIAGNOSIS — Z87891 Personal history of nicotine dependence: Secondary | ICD-10-CM | POA: Diagnosis not present

## 2019-10-08 DIAGNOSIS — Z9181 History of falling: Secondary | ICD-10-CM | POA: Diagnosis not present

## 2019-10-08 DIAGNOSIS — J441 Chronic obstructive pulmonary disease with (acute) exacerbation: Secondary | ICD-10-CM | POA: Diagnosis not present

## 2019-10-08 DIAGNOSIS — E039 Hypothyroidism, unspecified: Secondary | ICD-10-CM | POA: Diagnosis not present

## 2019-10-08 DIAGNOSIS — F0390 Unspecified dementia without behavioral disturbance: Secondary | ICD-10-CM | POA: Diagnosis not present

## 2019-10-08 DIAGNOSIS — I110 Hypertensive heart disease with heart failure: Secondary | ICD-10-CM | POA: Diagnosis not present

## 2019-10-08 DIAGNOSIS — I441 Atrioventricular block, second degree: Secondary | ICD-10-CM | POA: Diagnosis not present

## 2019-10-08 DIAGNOSIS — Z950 Presence of cardiac pacemaker: Secondary | ICD-10-CM | POA: Diagnosis not present

## 2019-10-08 DIAGNOSIS — Z7901 Long term (current) use of anticoagulants: Secondary | ICD-10-CM | POA: Diagnosis not present

## 2019-10-08 DIAGNOSIS — J9611 Chronic respiratory failure with hypoxia: Secondary | ICD-10-CM | POA: Diagnosis not present

## 2019-10-08 DIAGNOSIS — I6522 Occlusion and stenosis of left carotid artery: Secondary | ICD-10-CM | POA: Diagnosis not present

## 2019-10-08 DIAGNOSIS — I5021 Acute systolic (congestive) heart failure: Secondary | ICD-10-CM | POA: Diagnosis not present

## 2019-10-09 ENCOUNTER — Other Ambulatory Visit: Payer: Self-pay

## 2019-10-09 ENCOUNTER — Ambulatory Visit: Payer: PPO | Admitting: Adult Health

## 2019-10-09 ENCOUNTER — Ambulatory Visit (INDEPENDENT_AMBULATORY_CARE_PROVIDER_SITE_OTHER): Payer: PPO | Admitting: Adult Health

## 2019-10-09 ENCOUNTER — Encounter: Payer: Self-pay | Admitting: Adult Health

## 2019-10-09 VITALS — BP 114/74 | HR 74 | Temp 97.3°F | Ht 64.0 in | Wt 125.0 lb

## 2019-10-09 DIAGNOSIS — G7000 Myasthenia gravis without (acute) exacerbation: Secondary | ICD-10-CM | POA: Diagnosis not present

## 2019-10-09 DIAGNOSIS — G609 Hereditary and idiopathic neuropathy, unspecified: Secondary | ICD-10-CM | POA: Diagnosis not present

## 2019-10-09 DIAGNOSIS — R413 Other amnesia: Secondary | ICD-10-CM

## 2019-10-09 NOTE — Progress Notes (Signed)
I have read the note, and I agree with the clinical assessment and plan.  Charles K Willis   

## 2019-10-09 NOTE — Patient Instructions (Signed)
Your Plan:  Continue Aricept  Continue gabapentin and Cymbalta  If your symptoms worsen or you develop new symptoms please let us know.   Thank you for coming to see [place] at [facility]. I hope we have been able to provide you high quality care today.  You may receive a patient satisfaction survey over the next few weeks. We would appreciate your feedback and comments so that we may continue to improve ourselves and the health of our patients.
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
## 2019-10-13 ENCOUNTER — Ambulatory Visit: Payer: PPO | Admitting: Adult Health

## 2019-10-14 ENCOUNTER — Ambulatory Visit: Payer: PPO | Admitting: Adult Health

## 2019-10-14 DIAGNOSIS — I4891 Unspecified atrial fibrillation: Secondary | ICD-10-CM | POA: Diagnosis not present

## 2019-10-14 DIAGNOSIS — Z9181 History of falling: Secondary | ICD-10-CM | POA: Diagnosis not present

## 2019-10-14 DIAGNOSIS — I5021 Acute systolic (congestive) heart failure: Secondary | ICD-10-CM | POA: Diagnosis not present

## 2019-10-14 DIAGNOSIS — Z950 Presence of cardiac pacemaker: Secondary | ICD-10-CM | POA: Diagnosis not present

## 2019-10-14 DIAGNOSIS — Z87891 Personal history of nicotine dependence: Secondary | ICD-10-CM | POA: Diagnosis not present

## 2019-10-14 DIAGNOSIS — F0390 Unspecified dementia without behavioral disturbance: Secondary | ICD-10-CM | POA: Diagnosis not present

## 2019-10-14 DIAGNOSIS — J9611 Chronic respiratory failure with hypoxia: Secondary | ICD-10-CM | POA: Diagnosis not present

## 2019-10-14 DIAGNOSIS — I2510 Atherosclerotic heart disease of native coronary artery without angina pectoris: Secondary | ICD-10-CM | POA: Diagnosis not present

## 2019-10-14 DIAGNOSIS — E039 Hypothyroidism, unspecified: Secondary | ICD-10-CM | POA: Diagnosis not present

## 2019-10-14 DIAGNOSIS — Z7901 Long term (current) use of anticoagulants: Secondary | ICD-10-CM | POA: Diagnosis not present

## 2019-10-14 DIAGNOSIS — I110 Hypertensive heart disease with heart failure: Secondary | ICD-10-CM | POA: Diagnosis not present

## 2019-10-14 DIAGNOSIS — I6522 Occlusion and stenosis of left carotid artery: Secondary | ICD-10-CM | POA: Diagnosis not present

## 2019-10-14 DIAGNOSIS — K219 Gastro-esophageal reflux disease without esophagitis: Secondary | ICD-10-CM | POA: Diagnosis not present

## 2019-10-14 DIAGNOSIS — Z9981 Dependence on supplemental oxygen: Secondary | ICD-10-CM | POA: Diagnosis not present

## 2019-10-14 DIAGNOSIS — I441 Atrioventricular block, second degree: Secondary | ICD-10-CM | POA: Diagnosis not present

## 2019-10-14 DIAGNOSIS — E785 Hyperlipidemia, unspecified: Secondary | ICD-10-CM | POA: Diagnosis not present

## 2019-10-14 DIAGNOSIS — J441 Chronic obstructive pulmonary disease with (acute) exacerbation: Secondary | ICD-10-CM | POA: Diagnosis not present

## 2019-10-14 DIAGNOSIS — K551 Chronic vascular disorders of intestine: Secondary | ICD-10-CM | POA: Diagnosis not present

## 2019-10-21 DIAGNOSIS — K219 Gastro-esophageal reflux disease without esophagitis: Secondary | ICD-10-CM | POA: Diagnosis not present

## 2019-10-21 DIAGNOSIS — I4891 Unspecified atrial fibrillation: Secondary | ICD-10-CM | POA: Diagnosis not present

## 2019-10-21 DIAGNOSIS — I2510 Atherosclerotic heart disease of native coronary artery without angina pectoris: Secondary | ICD-10-CM | POA: Diagnosis not present

## 2019-10-21 DIAGNOSIS — Z9981 Dependence on supplemental oxygen: Secondary | ICD-10-CM | POA: Diagnosis not present

## 2019-10-21 DIAGNOSIS — I441 Atrioventricular block, second degree: Secondary | ICD-10-CM | POA: Diagnosis not present

## 2019-10-21 DIAGNOSIS — F0390 Unspecified dementia without behavioral disturbance: Secondary | ICD-10-CM | POA: Diagnosis not present

## 2019-10-21 DIAGNOSIS — I110 Hypertensive heart disease with heart failure: Secondary | ICD-10-CM | POA: Diagnosis not present

## 2019-10-21 DIAGNOSIS — I5021 Acute systolic (congestive) heart failure: Secondary | ICD-10-CM | POA: Diagnosis not present

## 2019-10-21 DIAGNOSIS — J9611 Chronic respiratory failure with hypoxia: Secondary | ICD-10-CM | POA: Diagnosis not present

## 2019-10-21 DIAGNOSIS — J441 Chronic obstructive pulmonary disease with (acute) exacerbation: Secondary | ICD-10-CM | POA: Diagnosis not present

## 2019-10-21 DIAGNOSIS — E039 Hypothyroidism, unspecified: Secondary | ICD-10-CM | POA: Diagnosis not present

## 2019-10-21 DIAGNOSIS — Z7901 Long term (current) use of anticoagulants: Secondary | ICD-10-CM | POA: Diagnosis not present

## 2019-10-21 DIAGNOSIS — E785 Hyperlipidemia, unspecified: Secondary | ICD-10-CM | POA: Diagnosis not present

## 2019-10-21 DIAGNOSIS — Z87891 Personal history of nicotine dependence: Secondary | ICD-10-CM | POA: Diagnosis not present

## 2019-10-21 DIAGNOSIS — Z9181 History of falling: Secondary | ICD-10-CM | POA: Diagnosis not present

## 2019-10-21 DIAGNOSIS — I6522 Occlusion and stenosis of left carotid artery: Secondary | ICD-10-CM | POA: Diagnosis not present

## 2019-10-21 DIAGNOSIS — K551 Chronic vascular disorders of intestine: Secondary | ICD-10-CM | POA: Diagnosis not present

## 2019-10-21 DIAGNOSIS — Z950 Presence of cardiac pacemaker: Secondary | ICD-10-CM | POA: Diagnosis not present

## 2019-10-23 ENCOUNTER — Ambulatory Visit: Payer: PPO

## 2019-10-23 ENCOUNTER — Ambulatory Visit (INDEPENDENT_AMBULATORY_CARE_PROVIDER_SITE_OTHER): Payer: PPO | Admitting: *Deleted

## 2019-10-23 ENCOUNTER — Other Ambulatory Visit: Payer: Self-pay

## 2019-10-23 DIAGNOSIS — M810 Age-related osteoporosis without current pathological fracture: Secondary | ICD-10-CM

## 2019-10-23 MED ORDER — DENOSUMAB 60 MG/ML ~~LOC~~ SOSY
60.0000 mg | PREFILLED_SYRINGE | Freq: Once | SUBCUTANEOUS | Status: AC
  Administered 2019-10-23: 60 mg via SUBCUTANEOUS

## 2019-10-23 NOTE — Progress Notes (Signed)
Pls cosign for prolia inj since PCP is out of the office this afternoon.Marland KitchenJohny Chess

## 2019-10-24 ENCOUNTER — Telehealth: Payer: Self-pay | Admitting: Internal Medicine

## 2019-10-24 NOTE — Telephone Encounter (Signed)
[facility] - informed orders have been faxed.   Kerry Dory stated that there was one more order to sign for pt. Once Kerry Dory is able to send to [place] - he will fax it.

## 2019-10-24 NOTE — Telephone Encounter (Signed)
New message:   Brandi Raymond is calling from [facility] inquiring about some orders that required a signature and they have not received anything back as of yet. Please advise.

## 2019-10-27 NOTE — Telephone Encounter (Signed)
Faxed

## 2019-11-01 ENCOUNTER — Other Ambulatory Visit: Payer: Self-pay | Admitting: Internal Medicine

## 2019-11-04 NOTE — Telephone Encounter (Signed)
Last OV 07/04/19 Next OV 01/05/20 Last RF 10/02/19

## 2019-11-10 ENCOUNTER — Encounter: Payer: Self-pay | Admitting: Internal Medicine

## 2019-11-10 DIAGNOSIS — M25561 Pain in right knee: Secondary | ICD-10-CM

## 2019-11-10 DIAGNOSIS — G8929 Other chronic pain: Secondary | ICD-10-CM

## 2019-11-11 ENCOUNTER — Telehealth: Payer: Self-pay

## 2019-11-11 NOTE — Telephone Encounter (Signed)
I spoke to the patient's daughter [name]) and informed her of Andy's advisement pertaining to O2 renewal and that they will need to reach out to PCP or to whoever prescribed initial start.  She verbalized understanding.

## 2019-11-13 ENCOUNTER — Telehealth: Payer: Self-pay | Admitting: Adult Health

## 2019-11-13 NOTE — Telephone Encounter (Signed)
Called [employer] to find out what was needed as there was a qualifying walk done @ 3/12 office visit. JJ spoke with Corene Cornea from [place] [facility] who stated they appeared to have everything needed.  Rinaldo Ratel, CMA     2:15 PM Note   ATC [employer] back at the number provided in the message.  On hold x5 minutes.  Patient was just seen on 3.12.21 by Tammy P and O2 recert was done at that time.  Called the local help desk number for [employer] and spoke with Corene Cornea who reported that it appears that they have everything they need and shouldn't need anything further.    Will sign off.          Let a message for [facility] with [employer] to find out what further documentation is needed. Also contacted patient's daughter and made aware we would call her back with what's needed.

## 2019-11-14 NOTE — Telephone Encounter (Signed)
Called [employer] this am.  Rep states they can't see the 09/26/19 ov note the last note they see is in 11/20. Faxed ov note, demographics and walk information. Also sent message to [facility] to let us know if anything further needed.

## 2019-11-17 ENCOUNTER — Telehealth: Payer: Self-pay | Admitting: Adult Health

## 2019-11-17 NOTE — Telephone Encounter (Signed)
Relayed info to patient's daughter [name]. Informed that last ov note and walk information faxed to [employer] on last Friday. Also told her if she receives another call to have [employer] call us to see what else may be needed. She said she was already aware of her mother failing walk test for POC. Nothing further needed at this time.

## 2019-11-17 NOTE — Telephone Encounter (Signed)
LM for patient's daughter to return call:   Returned Marlowe's call pt failed walk evaluation in the home therefore does not qualify for a POC. She did pass in the office but she also had to pass with [employer]. There was no mention of needing to be re-certified only that she failed POC

## 2019-11-17 NOTE — Telephone Encounter (Signed)
Daughter called back, please return call anytime today or tomorrow.

## 2019-11-18 ENCOUNTER — Ambulatory Visit (HOSPITAL_COMMUNITY)
Admission: RE | Admit: 2019-11-18 | Discharge: 2019-11-18 | Disposition: A | Discharge status: Home / Self Care | Payer: PPO | Source: Ambulatory Visit | Attending: Internal Medicine | Admitting: Internal Medicine

## 2019-11-18 ENCOUNTER — Other Ambulatory Visit: Payer: Self-pay

## 2019-11-18 ENCOUNTER — Inpatient Hospital Stay: Payer: PPO | Attending: Internal Medicine

## 2019-11-18 ENCOUNTER — Ambulatory Visit: Payer: PPO | Admitting: Orthopaedic Surgery

## 2019-11-18 ENCOUNTER — Ambulatory Visit: Payer: PPO | Admitting: Internal Medicine

## 2019-11-18 DIAGNOSIS — I509 Heart failure, unspecified: Secondary | ICD-10-CM | POA: Insufficient documentation

## 2019-11-18 DIAGNOSIS — Z950 Presence of cardiac pacemaker: Secondary | ICD-10-CM | POA: Diagnosis not present

## 2019-11-18 DIAGNOSIS — C3491 Malignant neoplasm of unspecified part of right bronchus or lung: Secondary | ICD-10-CM | POA: Diagnosis not present

## 2019-11-18 DIAGNOSIS — C3431 Malignant neoplasm of lower lobe, right bronchus or lung: Secondary | ICD-10-CM | POA: Insufficient documentation

## 2019-11-18 DIAGNOSIS — R911 Solitary pulmonary nodule: Secondary | ICD-10-CM | POA: Insufficient documentation

## 2019-11-18 DIAGNOSIS — R0602 Shortness of breath: Secondary | ICD-10-CM | POA: Diagnosis not present

## 2019-11-18 DIAGNOSIS — Z9981 Dependence on supplemental oxygen: Secondary | ICD-10-CM | POA: Insufficient documentation

## 2019-11-18 DIAGNOSIS — C3490 Malignant neoplasm of unspecified part of unspecified bronchus or lung: Secondary | ICD-10-CM | POA: Diagnosis not present

## 2019-11-18 LAB — CBC WITH DIFFERENTIAL (CANCER CENTER ONLY)
Abs Immature Granulocytes: 0.03 K/uL (ref 0.00–0.07)
Basophils Absolute: 0.1 K/uL (ref 0.0–0.1)
Basophils Relative: 1 %
Eosinophils Absolute: 0.5 K/uL (ref 0.0–0.5)
Eosinophils Relative: 9 %
HCT: 37.9 % (ref 36.0–46.0)
Hemoglobin: 11.2 g/dL — ABNORMAL LOW (ref 12.0–15.0)
Immature Granulocytes: 1 %
Lymphocytes Relative: 22 %
Lymphs Abs: 1.3 K/uL (ref 0.7–4.0)
MCH: 25.5 pg — ABNORMAL LOW (ref 26.0–34.0)
MCHC: 29.6 g/dL — ABNORMAL LOW (ref 30.0–36.0)
MCV: 86.3 fL (ref 80.0–100.0)
Monocytes Absolute: 0.8 K/uL (ref 0.1–1.0)
Monocytes Relative: 14 %
Neutro Abs: 3 K/uL (ref 1.7–7.7)
Neutrophils Relative %: 53 %
Platelet Count: 151 K/uL (ref 150–400)
RBC: 4.39 MIL/uL (ref 3.87–5.11)
RDW: 14 % (ref 11.5–15.5)
WBC Count: 5.7 K/uL (ref 4.0–10.5)
nRBC: 0 % (ref 0.0–0.2)

## 2019-11-18 LAB — CMP (CANCER CENTER ONLY)
ALT: 13 U/L (ref 0–44)
AST: 24 U/L (ref 15–41)
Albumin: 3.2 g/dL — ABNORMAL LOW (ref 3.5–5.0)
Alkaline Phosphatase: 77 U/L (ref 38–126)
Anion gap: 9 (ref 5–15)
BUN: 18 mg/dL (ref 8–23)
CO2: 31 mmol/L (ref 22–32)
Calcium: 8.2 mg/dL — ABNORMAL LOW (ref 8.9–10.3)
Chloride: 104 mmol/L (ref 98–111)
Creatinine: 0.94 mg/dL (ref 0.44–1.00)
GFR, Est AFR Am: >60 mL/min (ref >60)
GFR, Estimated: 56 mL/min — ABNORMAL LOW (ref >60)
Glucose, Bld: 104 mg/dL — ABNORMAL HIGH (ref 70–99)
Potassium: 4.4 mmol/L (ref 3.5–5.1)
Sodium: 144 mmol/L (ref 135–145)
Total Bilirubin: 0.4 mg/dL (ref 0.3–1.2)
Total Protein: 6.3 g/dL — ABNORMAL LOW (ref 6.5–8.1)

## 2019-11-18 MED ORDER — SODIUM CHLORIDE (PF) 0.9 % IJ SOLN
INTRAMUSCULAR | Status: AC
  Filled 2019-11-18: qty 50

## 2019-11-18 MED ORDER — IOHEXOL 300 MG/ML  SOLN
75.0000 mL | Freq: Once | INTRAMUSCULAR | Status: AC | PRN
  Administered 2019-11-18: 75 mL via INTRAVENOUS

## 2019-11-20 ENCOUNTER — Other Ambulatory Visit: Payer: Self-pay

## 2019-11-20 ENCOUNTER — Encounter: Payer: Self-pay | Admitting: Internal Medicine

## 2019-11-20 ENCOUNTER — Inpatient Hospital Stay: Payer: PPO | Admitting: Internal Medicine

## 2019-11-20 VITALS — BP 149/67 | HR 60 | Temp 98.2°F | Resp 17 | Ht 64.0 in | Wt 133.4 lb

## 2019-11-20 DIAGNOSIS — C3491 Malignant neoplasm of unspecified part of right bronchus or lung: Secondary | ICD-10-CM | POA: Diagnosis not present

## 2019-11-20 DIAGNOSIS — C3490 Malignant neoplasm of unspecified part of unspecified bronchus or lung: Secondary | ICD-10-CM | POA: Diagnosis not present

## 2019-11-20 DIAGNOSIS — C3431 Malignant neoplasm of lower lobe, right bronchus or lung: Secondary | ICD-10-CM | POA: Diagnosis not present
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
## 2019-11-21 ENCOUNTER — Ambulatory Visit: Payer: PPO | Admitting: Orthopaedic Surgery

## 2019-11-21 ENCOUNTER — Ambulatory Visit (INDEPENDENT_AMBULATORY_CARE_PROVIDER_SITE_OTHER): Payer: PPO

## 2019-11-21 ENCOUNTER — Encounter: Payer: Self-pay | Admitting: Orthopaedic Surgery

## 2019-11-21 VITALS — Ht 64.0 in | Wt 133.0 lb

## 2019-11-21 DIAGNOSIS — M1711 Unilateral primary osteoarthritis, right knee: Secondary | ICD-10-CM

## 2019-11-21 DIAGNOSIS — M25561 Pain in right knee: Secondary | ICD-10-CM

## 2019-11-21 DIAGNOSIS — G8929 Other chronic pain: Secondary | ICD-10-CM | POA: Diagnosis not present

## 2019-11-21 MED ORDER — BUPIVACAINE HCL 0.5 % IJ SOLN
2.0000 mL | INTRAMUSCULAR | Status: AC | PRN
  Administered 2019-11-21: 2 mL via INTRA_ARTICULAR

## 2019-11-21 MED ORDER — LIDOCAINE HCL 1 % IJ SOLN
2.0000 mL | INTRAMUSCULAR | Status: AC | PRN
  Administered 2019-11-21: 2 mL

## 2019-11-21 MED ORDER — METHYLPREDNISOLONE ACETATE 40 MG/ML IJ SUSP
40.0000 mg | INTRAMUSCULAR | Status: AC | PRN
  Administered 2019-11-21: 40 mg via INTRA_ARTICULAR
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
## 2019-11-24 ENCOUNTER — Ambulatory Visit (INDEPENDENT_AMBULATORY_CARE_PROVIDER_SITE_OTHER): Payer: PPO | Admitting: *Deleted

## 2019-11-24 ENCOUNTER — Telehealth (INDEPENDENT_AMBULATORY_CARE_PROVIDER_SITE_OTHER): Payer: PPO | Admitting: Internal Medicine

## 2019-11-24 ENCOUNTER — Other Ambulatory Visit: Payer: Self-pay

## 2019-11-24 ENCOUNTER — Encounter: Payer: Self-pay | Admitting: Internal Medicine

## 2019-11-24 VITALS — BP 145/84

## 2019-11-24 DIAGNOSIS — I10 Essential (primary) hypertension: Secondary | ICD-10-CM

## 2019-11-24 DIAGNOSIS — I441 Atrioventricular block, second degree: Secondary | ICD-10-CM | POA: Diagnosis not present

## 2019-11-24 DIAGNOSIS — I442 Atrioventricular block, complete: Secondary | ICD-10-CM | POA: Diagnosis not present

## 2019-11-24 DIAGNOSIS — I5032 Chronic diastolic (congestive) heart failure: Secondary | ICD-10-CM | POA: Diagnosis not present

## 2019-11-24 DIAGNOSIS — I480 Paroxysmal atrial fibrillation: Secondary | ICD-10-CM | POA: Diagnosis not present

## 2019-11-24 LAB — CUP PACEART REMOTE DEVICE CHECK
Battery Remaining Longevity: 123 mo
Battery Remaining Percentage: 95.5 %
Battery Voltage: 3.04 V
Brady Statistic AP VP Percent: 15 %
Brady Statistic AP VS Percent: 11 %
Brady Statistic AS VP Percent: 47 %
Brady Statistic AS VS Percent: 26 %
Brady Statistic RA Percent Paced: 23 %
Brady Statistic RV Percent Paced: 61 %
Date Time Interrogation Session: 20210510020022
Implantable Lead Implant Date: 20210204
Implantable Lead Implant Date: 20210204
Implantable Lead Location: 753859
Implantable Lead Location: 753860
Implantable Pulse Generator Implant Date: 20210204
Lead Channel Impedance Value: 430 Ohm
Lead Channel Impedance Value: 590 Ohm
Lead Channel Pacing Threshold Amplitude: 0.625 V
Lead Channel Pacing Threshold Amplitude: 0.875 V
Lead Channel Pacing Threshold Pulse Width: 0.4 ms
Lead Channel Pacing Threshold Pulse Width: 0.5 ms
Lead Channel Sensing Intrinsic Amplitude: 12 mV
Lead Channel Sensing Intrinsic Amplitude: 2.8 mV
Lead Channel Setting Pacing Amplitude: 1.125
Lead Channel Setting Pacing Amplitude: 1.625
Lead Channel Setting Pacing Pulse Width: 0.4 ms
Lead Channel Setting Sensing Sensitivity: 2.5 mV
Pulse Gen Model: 2272
Pulse Gen Serial Number: 9196859
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
## 2019-11-25 ENCOUNTER — Telehealth: Payer: Self-pay | Admitting: Internal Medicine

## 2019-11-25 NOTE — Telephone Encounter (Signed)
Scheduled per los. Called and left msg. Mailed printout  °

## 2019-11-25 NOTE — Progress Notes (Signed)
Remote pacemaker transmission.   

## 2019-12-01 ENCOUNTER — Other Ambulatory Visit: Payer: Self-pay | Admitting: Internal Medicine

## 2019-12-02 NOTE — Telephone Encounter (Signed)
Last RF was 11/04/19 Last OV 07/04/19 Next OV 01/07/20

## 2019-12-09 ENCOUNTER — Encounter: Payer: Self-pay | Admitting: Internal Medicine

## 2019-12-09 ENCOUNTER — Encounter: Payer: Self-pay | Admitting: Orthopaedic Surgery

## 2019-12-09 DIAGNOSIS — J441 Chronic obstructive pulmonary disease with (acute) exacerbation: Secondary | ICD-10-CM | POA: Diagnosis not present

## 2019-12-10 NOTE — Telephone Encounter (Signed)
Yes lets get approval for gel injection

## 2019-12-24 DIAGNOSIS — H02534 Eyelid retraction left upper eyelid: Secondary | ICD-10-CM | POA: Diagnosis not present

## 2019-12-24 DIAGNOSIS — H18513 Endothelial corneal dystrophy, bilateral: Secondary | ICD-10-CM | POA: Diagnosis not present

## 2019-12-24 DIAGNOSIS — H532 Diplopia: Secondary | ICD-10-CM | POA: Diagnosis not present

## 2019-12-24 DIAGNOSIS — H0102B Squamous blepharitis left eye, upper and lower eyelids: Secondary | ICD-10-CM | POA: Diagnosis not present

## 2019-12-24 DIAGNOSIS — H0102A Squamous blepharitis right eye, upper and lower eyelids: Secondary | ICD-10-CM | POA: Diagnosis not present

## 2019-12-24 DIAGNOSIS — H353131 Nonexudative age-related macular degeneration, bilateral, early dry stage: Secondary | ICD-10-CM | POA: Diagnosis not present

## 2019-12-24 DIAGNOSIS — H02834 Dermatochalasis of left upper eyelid: Secondary | ICD-10-CM | POA: Diagnosis not present

## 2019-12-24 DIAGNOSIS — Z961 Presence of intraocular lens: Secondary | ICD-10-CM | POA: Diagnosis not present

## 2019-12-24 DIAGNOSIS — H02831 Dermatochalasis of right upper eyelid: Secondary | ICD-10-CM | POA: Diagnosis not present

## 2019-12-25 NOTE — Telephone Encounter (Signed)
Will submit.

## 2019-12-27 ENCOUNTER — Encounter: Payer: Self-pay | Admitting: Orthopaedic Surgery

## 2019-12-28 ENCOUNTER — Other Ambulatory Visit: Payer: Self-pay | Admitting: Internal Medicine

## 2019-12-29 ENCOUNTER — Encounter: Payer: Self-pay | Admitting: Adult Health

## 2019-12-29 ENCOUNTER — Ambulatory Visit: Payer: PPO | Admitting: Adult Health

## 2019-12-29 ENCOUNTER — Other Ambulatory Visit: Payer: Self-pay

## 2019-12-29 DIAGNOSIS — C3491 Malignant neoplasm of unspecified part of right bronchus or lung: Secondary | ICD-10-CM | POA: Diagnosis not present

## 2019-12-29 DIAGNOSIS — J439 Emphysema, unspecified: Secondary | ICD-10-CM | POA: Diagnosis not present

## 2019-12-29 DIAGNOSIS — J9611 Chronic respiratory failure with hypoxia: Secondary | ICD-10-CM | POA: Diagnosis not present

## 2019-12-29 NOTE — Assessment & Plan Note (Signed)
Continue on continuous flow oxygen   Plan  Patient Instructions  Continue on ANORO 1 puff daily  Follow up for CT chest as planned later this year per Oncology .  Continue on Oxygen 2l/m .  Follow up in 4  month with Dr. Vaughan Browner and As needed

## 2019-12-29 NOTE — Assessment & Plan Note (Signed)
Compensated on present regimen   Plan  Patient Instructions  Continue on ANORO 1 puff daily  Follow up for CT chest as planned later this year per Oncology .  Continue on Oxygen 2l/m .  Follow up in 4  month with Dr. Vaughan Browner and As needed
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
## 2019-12-29 NOTE — Assessment & Plan Note (Signed)
S/p SBRT . Continue follow up with Oncology  CT chest last month stable

## 2019-12-29 NOTE — Patient Instructions (Addendum)
Continue on ANORO 1 puff daily  Follow up for CT chest as planned later this year per Oncology .  Continue on Oxygen 2l/m .  Follow up in 4  month with Dr. Vaughan Browner and As needed

## 2019-12-30 ENCOUNTER — Telehealth: Payer: Self-pay

## 2019-12-30 ENCOUNTER — Other Ambulatory Visit: Payer: Self-pay | Admitting: Vascular Surgery

## 2019-12-30 ENCOUNTER — Other Ambulatory Visit: Payer: Self-pay | Admitting: Student

## 2019-12-30 DIAGNOSIS — K551 Chronic vascular disorders of intestine: Secondary | ICD-10-CM

## 2019-12-30 NOTE — Telephone Encounter (Signed)
Submitted VOB for Monovisc, right knee. 

## 2020-01-01 ENCOUNTER — Telehealth: Payer: Self-pay

## 2020-01-01 ENCOUNTER — Other Ambulatory Visit: Payer: Self-pay | Admitting: Internal Medicine

## 2020-01-01 NOTE — Telephone Encounter (Signed)
Check [place] registry last filled 12/02/2019. MD is out of the office until Tues 01/06/20. Pls advise on refill.Marland KitchenJohny Chess

## 2020-01-01 NOTE — Telephone Encounter (Signed)
Talked with patient's daughter and advised her that patient is approved for gel injection.  Approved, Monovisc, right knee. [place] Patient will be responsible for 20% OOP and 20% for administration, which could be an estimate of $350.00-$400.00 per Wendy May. Co-pay of $30.00 No PA required

## 2020-01-01 NOTE — Telephone Encounter (Signed)
Done erx 

## 2020-01-02 ENCOUNTER — Ambulatory Visit: Payer: PPO | Admitting: Internal Medicine

## 2020-01-05 ENCOUNTER — Ambulatory Visit: Payer: PPO | Admitting: Internal Medicine

## 2020-01-06 NOTE — Assessment & Plan Note (Addendum)
Chronic On prolia - last injection 10/2019 Taking cal/vitamin d Continue Prolia every 6 months

## 2020-01-06 NOTE — Patient Instructions (Addendum)
  Medications reviewed and updated.  Changes include :   none    Please followup in 6 months   

## 2020-01-06 NOTE — Progress Notes (Signed)
Subjective:    Patient ID: Brandi Raymond, female    DOB: [birth year], 86 y.o.   MRN: [id number]  HPI The patient is here for follow up of their chronic medical problems, including CAD, htn, OP on prolia, COPD on oxygen, Lung cancer s/p radiation, hypothyroidism, prediabetes, hyperlipidemia, sleep d/o, depression and memory issues  She is taking all of her medications as prescribed.     Has had some nosebleeds.  She wears her oxygen 24/7.  She does not have a humidifier on her oxygen at night.  The bleeding stops quickly and she is not too concerned about it.  She will have a gel injection in her knee soon.  That does cause her difficulty with ambulation, pain and difficulty with her balance.  She does walk around her house without too much difficulty.  She has a walker but does not always use it.  She also has chronic back pain.     Medications and allergies reviewed with patient and updated if appropriate.  Patient Active Problem List   Diagnosis Date Noted  . Acute respiratory failure ([place]) 09/02/2019  . Acute diastolic CHF (congestive heart failure) ([place]) 09/02/2019  . Paroxysmal atrial fibrillation ([facility])   . Second degree Mobitz II AV block 08/21/2019  . Second degree AV block 08/08/2019  . SOB (shortness of breath) 08/05/2019  . Chest tightness 08/05/2019  . GERD (gastroesophageal reflux disease) 05/21/2019  . Chronic respiratory failure with hypoxia ([place]) 05/21/2019  . Medication management 05/21/2019  . Stage I squamous cell carcinoma of right lung ([place]) 02/06/2019  . Constipation 09/11/2018  . COPD with acute exacerbation ([place]) 09/04/2018  . Right lower lobe lung mass 09/04/2018  . Closed compression fracture of L1 lumbar vertebra, initial encounter ([place]) 09/04/2018  . Preoperative clearance 07/19/2018  . Lower back pain 06/25/2018  . Cubital tunnel syndrome on left 05/22/2018  . Dupuytren's contracture of both hands 05/10/2018  . Primary osteoarthritis of both  knees 05/10/2018  . Difficulty urinating 05/10/2018  . Osteoporosis 06/14/2017  . Prediabetes 06/23/2015  . Depression 06/23/2015  . Carotid stenosis 10/12/2014  . Sleep disorder 04/09/2014  . Mesenteric artery stenosis ([place]) 01/13/2013  . Thoracic aneurysm without mention of rupture 11/04/2012  . Chronic mesenteric ischemia ([place]) 10/08/2012  . Memory deficit 06/20/2012  . Irritable bowel syndrome 06/20/2012  . Ocular myasthenia gravis ([place]) 06/20/2012  . PVD (peripheral vascular disease) ([place]) 06/20/2012  . Hereditary and idiopathic peripheral neuropathy 05/22/2012  . Syncope 08/28/2011  . ABDOMINAL BRUIT 08/17/2009  . Coronary atherosclerosis 09/05/2008  . Hypothyroidism 05/26/2008  . VITAMIN D DEFICIENCY 05/26/2008  . CHOLELITHIASIS 12/06/2007  . DIVERTICULOSIS, COLON 12/05/2007  . HYPERLIPIDEMIA 08/19/2007  . COPD (chronic obstructive pulmonary disease) with emphysema ([place]) 08/19/2007  . CIGARETTE SMOKER 02/06/2007  . Essential hypertension 02/06/2007  . COLONIC POLYPS 11/21/2004    Current Outpatient Medications on File Prior to Visit  Medication Sig Dispense Refill  . acetaminophen (TYLENOL) 500 MG tablet Take 500 mg by mouth every 6 (six) hours as needed for mild pain.    . Calcium-Magnesium-Vitamin D (CALCIUM 1200+D3 PO) Take 1 tablet by mouth daily.    . clonazePAM (KLONOPIN) 0.5 MG tablet TAKE 1 TABLET BY MOUTH EVERYDAY AT BEDTIME 30 tablet 0  . docusate sodium (COLACE) 100 MG capsule Take 100 mg by mouth 2 (two) times daily.    Marland Kitchen donepezil (ARICEPT) 10 MG tablet TAKE 1 TABLET BY MOUTH EVERYDAY AT BEDTIME 90 tablet 3  . DULoxetine (CYMBALTA)  60 MG capsule TAKE 1 CAPSULE BY MOUTH EVERY DAY 90 capsule 2  . famotidine (PEPCID) 20 MG tablet Take 20 mg by mouth 2 (two) times daily.    . furosemide (LASIX) 40 MG tablet Take 1 tablet (40 mg total) by mouth every other day. 45 tablet 3  . gabapentin (NEURONTIN) 300 MG capsule TAKE ONE CAPSULE TWICE DAILY AND 2 AT NIGHT = FOUR  TOTAL DAILY. 360 capsule 1  . ibuprofen (ADVIL) 200 MG tablet Take 600 mg by mouth every 6 (six) hours as needed for headache or moderate pain.    Marland Kitchen levothyroxine (SYNTHROID) 125 MCG tablet TAKE 1 TABLET (125 MCG TOTAL) BY MOUTH DAILY BEFORE BREAKFAST. 90 tablet 1  . metoprolol tartrate (LOPRESSOR) 25 MG tablet Take 1 tablet (25 mg total) by mouth daily. 30 tablet 6  . Multiple Vitamins-Minerals (PRESERVISION AREDS 2 PO) Take 1 capsule by mouth 2 (two) times daily.    . pantoprazole (PROTONIX) 40 MG tablet Take 1 tablet (40 mg total) by mouth daily. 90 tablet 1  . potassium chloride (KLOR-CON) 10 MEQ tablet Take 1 tablet 10 meq every other day with Lasix. 45 tablet 3  . pravastatin (PRAVACHOL) 40 MG tablet Take 1 tablet (40 mg total) by mouth daily. Follow-up appt w/labs are due must see provider for future refills 30 tablet 0  . PROLIA 60 MG/ML SOSY injection Inject 60 mg into the skin every 6 (six) months.     . umeclidinium-vilanterol (ANORO ELLIPTA) 62.5-25 MCG/INH AEPB INHALE 1 PUFF BY MOUTH EVERY DAY 60 each 5  . apixaban (ELIQUIS) 2.5 MG TABS tablet Take 1 tablet (2.5 mg total) by mouth 2 (two) times daily. 60 tablet 5   No current facility-administered medications on file prior to visit.    Past Medical History:  Diagnosis Date  . Adenomatous colon polyp   . Arthritis   . CAD (coronary artery disease)    PTCA of RCA   . Carotid artery occlusion   . Cervical spine fracture ([place])   . COPD (chronic obstructive pulmonary disease) ([place])   . Depression   . Diverticulosis   . Gastritis 09/15/1991  . GERD (gastroesophageal reflux disease) 09/15/1991   Dr Sharlett Iles  . Hiatal hernia 09/15/1991  . Hip fracture, right ([place])   . HLD (hyperlipidemia)   . HTN (hypertension)   . Hyperplastic polyps of stomach 11/2007   colonoscopy  . Hypertension   . Hypothyroidism    affecting the left eye, proptosis  . Iron deficiency anemia   . Lung cancer ([place])    s/p XRT  . Macular degeneration of left  eye   . Memory loss   . Mesenteric artery stenosis ([place])   . Myocardial infarction ([place]) 1993  . Ocular myasthenia gravis ([place])    Dr Jannifer Franklin  . Orthostatic hypotension 06/05/2013  . PONV (postoperative nausea and vomiting)   . Strabismus    left eye  . Syncope 1998    Past Surgical History:  Procedure Laterality Date  . ABDOMINAL AORTAGRAM N/A 10/15/2012   Procedure: ABDOMINAL Maxcine Ham;  Surgeon: Serafina Mitchell, MD;  Location: [facility] CATH LAB;  Service: Cardiovascular;  Laterality: N/A;  . arm surgery Left    fx  . BALLOON ANGIOPLASTY, ARTERY  1993  . CARDIAC CATHETERIZATION  1996   LAD 20/50, CFX OK, RCA 30 at prev PTCA site, EF with mild HK inferior wall  . CAROTID ANGIOGRAM N/A 10/28/2014   Procedure: CAROTID ANGIOGRAM;  Surgeon: Serafina Mitchell,  MD;  Location: [facility] CATH LAB;  Service: Cardiovascular;  Laterality: N/A;  . CAROTID ENDARTERECTOMY    . CATARACT EXTRACTION     bilateral  . COLONOSCOPY W/ POLYPECTOMY  2006   Adenomatous polyps  . ENDARTERECTOMY Left 01/14/2015   Procedure: LEFT CAROTID ENDARTERECTOMY ;  Surgeon: Serafina Mitchell, MD;  Location: [facility];  Service: Vascular;  Laterality: Left;  . ENDARTERECTOMY Right 08/12/2015   Procedure: ENDARTERECTOMY CAROTID WITH PATCH ANGIOPLASTY;  Surgeon: Serafina Mitchell, MD;  Location: [facility];  Service: Vascular;  Laterality: Right;  . EYE MUSCLE SURGERY Left 11/04/2015  . EYE SURGERY Bilateral May 2016   Eyelids  . FOOT SURGERY Left   . LEG SURGERY Left    laceration  . MIDDLE EAR SURGERY Left 1970  . PACEMAKER IMPLANT N/A 08/21/2019   Procedure: PACEMAKER IMPLANT;  Surgeon: [name], MD;  Location: [facility] CV LAB;  Service: Cardiovascular;  Laterality: N/A;  . PERCUTANEOUS STENT INTERVENTION  12/03/2012   Procedure: PERCUTANEOUS STENT INTERVENTION;  Surgeon: Serafina Mitchell, MD;  Location: [facility] CATH LAB;  Service: Cardiovascular;;  sma stent x1  . PERIPHERAL VASCULAR BALLOON ANGIOPLASTY  07/22/2019   Procedure: PERIPHERAL  VASCULAR BALLOON ANGIOPLASTY;  Surgeon: Serafina Mitchell, MD;  Location: [facility] CV LAB;  Service: Cardiovascular;;  Superior mesenteric  . STRABISMUS SURGERY Left 10/28/2015   Procedure: REPAIR STRABISMUS LEFT EYE;  Surgeon: Lamonte Sakai, MD;  Location: [facility];  Service: Ophthalmology;  Laterality: Left;  . Third-degree Denika Krone  2003   [facility]-legs ,buttocks,arms  . TOTAL ABDOMINAL HYSTERECTOMY  1973   Dysfunctional menses  . UPPER GI ENDOSCOPY      Dr Sharlett Iles  . VISCERAL ANGIOGRAM N/A 10/15/2012   Procedure: VISCERAL ANGIOGRAM;  Surgeon: Serafina Mitchell, MD;  Location: [facility] CATH LAB;  Service: Cardiovascular;  Laterality: N/A;  . VISCERAL ANGIOGRAM N/A 12/03/2012   Procedure: VISCERAL ANGIOGRAM;  Surgeon: Serafina Mitchell, MD;  Location: [facility] CATH LAB;  Service: Cardiovascular;  Laterality: N/A;  . VISCERAL ANGIOGRAM N/A 08/05/2013   Procedure: MESENTERIC ANGIOGRAM;  Surgeon: Serafina Mitchell, MD;  Location[facility] LAB;  Service: Cardiovascular;  Laterality: N/A;  . VISCERAL ANGIOGRAM N/A 10/28/2014   Procedure: VISCERAL ANGIOGRAM;  Surgeon: Serafina Mitchell, MD;  Location: [facility] CATH LAB;  Service: Cardiovascular;  Laterality: N/A;  . VISCERAL ANGIOGRAPHY N/A 07/22/2019   Procedure: MESENTERIC ANGIOGRAPHY;  Surgeon: Serafina Mitchell, MD;  Location: [facility] CV LAB;  Service: Cardiovascular;  Laterality: N/A;    Social History   Socioeconomic History  . Marital status: Married    Spouse name: Not on file  . Number of children: 3  . Years of education: 9th  . Highest education level: Not on file  Occupational History  . Occupation: Retired  Tobacco Use  . Smoking status: Former Smoker    Packs/day: 1.00    Years: 60.00    Pack years: 60.00    Types: Cigarettes    Quit date: 07/2019    Years since quitting: 0.4  . Smokeless tobacco: Never Used  . Tobacco comment: Successfully quit  Vaping Use  . Vaping Use: Never used  Substance and Sexual Activity  . Alcohol use: No     Alcohol/week: 0.0 standard drinks  . Drug use: No  . Sexual activity: Never  Other Topics Concern  . Not on file  Social History Narrative   Patient is right handed.  Lives in [name] with husband.   Patient  drinks 3-4 cups of caffeine daily.   Social Determinants of Health   Financial Resource Strain:   . Difficulty of Paying Living Expenses:   Food Insecurity:   . Worried About Charity fundraiser in the Last Year:   . Arboriculturist in the Last Year:   Transportation Needs:   . Film/video editor (Medical):   Marland Kitchen Lack of Transportation (Non-Medical):   Physical Activity:   . Days of Exercise per Week:   . Minutes of Exercise per Session:   Stress:   . Feeling of Stress :   Social Connections:   . Frequency of Communication with Friends and Family:   . Frequency of Social Gatherings with Friends and Family:   . Attends Religious Services:   . Active Member of Clubs or Organizations:   . Attends Archivist Meetings:   Marland Kitchen Marital Status:     Family History  Problem Relation Age of Onset  . Throat cancer Mother        ? thyroid cancer  . Cancer Mother   . Emphysema Father   . Diabetes Father   . Heart attack Father 54  . Colon cancer Brother   . Cerebral aneurysm Brother   . Hypothyroidism Sister        X52  . Cancer Brother        Ear  . Diabetes Paternal Grandmother   . Diabetes Paternal Grandfather   . Diabetes Maternal Aunt     Review of Systems  Constitutional: Negative for appetite change, chills and fever.  Respiratory: Positive for shortness of breath. Negative for cough and wheezing.   Cardiovascular: Positive for chest pain (once in a while - transient). Negative for palpitations and leg swelling.  Neurological: Positive for light-headedness (with standing) and headaches (occ).  Psychiatric/Behavioral: Positive for sleep disturbance (sleeps ok woth clonazepam).       Objective:   Vitals:   01/07/20 1111  BP: 122/80  Pulse: 78    Temp: 98 F (36.7 C)  SpO2: 95%   BP Readings from Last 3 Encounters:  01/07/20 122/80  12/29/19 114/60  11/24/19 (!) 145/84   Wt Readings from Last 3 Encounters:  01/07/20 133 lb 9.6 oz (60.6 kg)  12/29/19 131 lb 6.4 oz (59.6 kg)  11/21/19 133 lb (60.3 kg)   Body mass index is 22.93 kg/m.   Physical Exam    Constitutional: Appears well-developed and well-nourished. No distress.  HENT:  Head: Normocephalic and atraumatic.  Neck: Neck supple. No tracheal deviation present. No thyromegaly present.  No cervical lymphadenopathy Cardiovascular: Normal rate, regular rhythm and normal heart sounds.   No murmur heard. No carotid bruit .  No edema Pulmonary/Chest: Effort normal.  Decreased breath sounds diffusely.  No respiratory distress. No has no wheezes.  Areas of dry crackles. Skin: Skin is warm and dry. Not diaphoretic.  Psychiatric: Normal mood and affect. Behavior is normal.      Assessment & Plan:    See Problem List for Assessment and Plan of chronic medical problems.    This visit occurred during the SARS-CoV-2 public health emergency.  Safety protocols were in place, including screening questions prior to the visit, additional usage of staff PPE, and extensive cleaning of exam room while observing appropriate contact time as indicated for disinfecting solutions.

## 2020-01-07 ENCOUNTER — Other Ambulatory Visit: Payer: Self-pay

## 2020-01-07 ENCOUNTER — Encounter: Payer: Self-pay | Admitting: Internal Medicine

## 2020-01-07 ENCOUNTER — Ambulatory Visit (INDEPENDENT_AMBULATORY_CARE_PROVIDER_SITE_OTHER): Payer: PPO | Admitting: Internal Medicine

## 2020-01-07 VITALS — BP 122/80 | HR 78 | Temp 98.0°F | Ht 64.0 in | Wt 133.6 lb

## 2020-01-07 DIAGNOSIS — I10 Essential (primary) hypertension: Secondary | ICD-10-CM | POA: Diagnosis not present

## 2020-01-07 DIAGNOSIS — E782 Mixed hyperlipidemia: Secondary | ICD-10-CM

## 2020-01-07 DIAGNOSIS — F329 Major depressive disorder, single episode, unspecified: Secondary | ICD-10-CM | POA: Diagnosis not present

## 2020-01-07 DIAGNOSIS — E039 Hypothyroidism, unspecified: Secondary | ICD-10-CM

## 2020-01-07 DIAGNOSIS — G479 Sleep disorder, unspecified: Secondary | ICD-10-CM

## 2020-01-07 DIAGNOSIS — R7303 Prediabetes: Secondary | ICD-10-CM | POA: Diagnosis not present

## 2020-01-07 DIAGNOSIS — R413 Other amnesia: Secondary | ICD-10-CM

## 2020-01-07 DIAGNOSIS — M810 Age-related osteoporosis without current pathological fracture: Secondary | ICD-10-CM

## 2020-01-07 DIAGNOSIS — F32A Depression, unspecified: Secondary | ICD-10-CM

## 2020-01-07 NOTE — Assessment & Plan Note (Signed)
Chronic Controlled with clonazepam nightly Continue

## 2020-01-07 NOTE — Assessment & Plan Note (Signed)
History of hypertension Blood pressure controlled On metoprolol, not so much for blood pressure, but for atrial fibrillation, CHF Does occasionally have some lightheadedness, which could be some mild orthostasis-if this gets worse should discuss with cardiology

## 2020-01-07 NOTE — Assessment & Plan Note (Signed)
Chronic Following with neurology Currently taking Aricept Continue

## 2020-01-07 NOTE — Assessment & Plan Note (Signed)
Lab Results  Component Value Date   HGBA1C 6.0 12/13/2017   Sugars have been stable in the prediabetic range Will recheck A1c at her next visit

## 2020-01-07 NOTE — Assessment & Plan Note (Signed)
Chronic Last TSH in normal range Continue current dose of levothyroxine

## 2020-01-07 NOTE — Assessment & Plan Note (Signed)
Chronic Controlled, stable Continue current dose of medication Cymbalta 60 mg daily

## 2020-01-07 NOTE — Assessment & Plan Note (Signed)
Chronic Lipids have been well controlled Continue pravastatin at current dose

## 2020-01-08 ENCOUNTER — Ambulatory Visit: Payer: PPO | Admitting: Orthopaedic Surgery

## 2020-01-08 DIAGNOSIS — M1711 Unilateral primary osteoarthritis, right knee: Secondary | ICD-10-CM | POA: Diagnosis not present

## 2020-01-08 MED ORDER — HYALURONAN 88 MG/4ML IX SOSY
88.0000 mg | PREFILLED_SYRINGE | INTRA_ARTICULAR | Status: AC | PRN
  Administered 2020-01-08: 88 mg via INTRA_ARTICULAR

## 2020-01-08 MED ORDER — LIDOCAINE HCL 1 % IJ SOLN
5.0000 mL | INTRAMUSCULAR | Status: AC | PRN
  Administered 2020-01-08: 5 mL

## 2020-01-08 NOTE — Progress Notes (Signed)
   Procedure Note  Patient: Brandi Raymond             Date of Birth: September 09, 1933           MRN: [id number]             Visit Date: 01/08/2020  Procedures: Visit Diagnoses:  1. Primary osteoarthritis of right knee     Large Joint Inj: R knee on 01/08/2020 10:35 AM Indications: pain Details: 22 G needle  Arthrogram: No  Medications: 88 mg Hyaluronan 88 MG/4ML; 5 mL lidocaine 1 % Outcome: tolerated well, no immediate complications Patient was prepped and draped in the usual sterile fashion.

## 2020-01-09 DIAGNOSIS — J441 Chronic obstructive pulmonary disease with (acute) exacerbation: Secondary | ICD-10-CM | POA: Diagnosis not present

## 2020-01-10 ENCOUNTER — Other Ambulatory Visit: Payer: Self-pay | Admitting: Internal Medicine

## 2020-01-11 ENCOUNTER — Encounter (HOSPITAL_COMMUNITY): Payer: Self-pay | Admitting: Emergency Medicine

## 2020-01-11 ENCOUNTER — Inpatient Hospital Stay (HOSPITAL_COMMUNITY)
Admission: AD | Admit: 2020-01-11 | Discharge: 2020-01-13 | DRG: 193 | Disposition: A | Discharge status: Home / Self Care | Payer: PPO | Attending: Family Medicine | Admitting: Family Medicine

## 2020-01-11 ENCOUNTER — Other Ambulatory Visit: Payer: Self-pay

## 2020-01-11 ENCOUNTER — Emergency Department (HOSPITAL_COMMUNITY): Payer: PPO

## 2020-01-11 DIAGNOSIS — Z800 Family history of malignant neoplasm of digestive organs: Secondary | ICD-10-CM | POA: Diagnosis not present

## 2020-01-11 DIAGNOSIS — Z90710 Acquired absence of both cervix and uterus: Secondary | ICD-10-CM

## 2020-01-11 DIAGNOSIS — J189 Pneumonia, unspecified organism: Secondary | ICD-10-CM | POA: Diagnosis not present

## 2020-01-11 DIAGNOSIS — R413 Other amnesia: Secondary | ICD-10-CM | POA: Diagnosis present

## 2020-01-11 DIAGNOSIS — R062 Wheezing: Secondary | ICD-10-CM | POA: Diagnosis not present

## 2020-01-11 DIAGNOSIS — Z20822 Contact with and (suspected) exposure to covid-19: Secondary | ICD-10-CM | POA: Diagnosis present

## 2020-01-11 DIAGNOSIS — Z833 Family history of diabetes mellitus: Secondary | ICD-10-CM

## 2020-01-11 DIAGNOSIS — I480 Paroxysmal atrial fibrillation: Secondary | ICD-10-CM | POA: Diagnosis present

## 2020-01-11 DIAGNOSIS — K449 Diaphragmatic hernia without obstruction or gangrene: Secondary | ICD-10-CM | POA: Diagnosis not present

## 2020-01-11 DIAGNOSIS — G7000 Myasthenia gravis without (acute) exacerbation: Secondary | ICD-10-CM | POA: Diagnosis not present

## 2020-01-11 DIAGNOSIS — Z808 Family history of malignant neoplasm of other organs or systems: Secondary | ICD-10-CM

## 2020-01-11 DIAGNOSIS — I5031 Acute diastolic (congestive) heart failure: Secondary | ICD-10-CM | POA: Diagnosis not present

## 2020-01-11 DIAGNOSIS — E039 Hypothyroidism, unspecified: Secondary | ICD-10-CM | POA: Diagnosis present

## 2020-01-11 DIAGNOSIS — I10 Essential (primary) hypertension: Secondary | ICD-10-CM | POA: Diagnosis not present

## 2020-01-11 DIAGNOSIS — Z86010 Personal history of colonic polyps: Secondary | ICD-10-CM

## 2020-01-11 DIAGNOSIS — J439 Emphysema, unspecified: Secondary | ICD-10-CM | POA: Diagnosis not present

## 2020-01-11 DIAGNOSIS — K219 Gastro-esophageal reflux disease without esophagitis: Secondary | ICD-10-CM | POA: Diagnosis not present

## 2020-01-11 DIAGNOSIS — I2510 Atherosclerotic heart disease of native coronary artery without angina pectoris: Secondary | ICD-10-CM | POA: Diagnosis present

## 2020-01-11 DIAGNOSIS — E785 Hyperlipidemia, unspecified: Secondary | ICD-10-CM | POA: Diagnosis present

## 2020-01-11 DIAGNOSIS — Z923 Personal history of irradiation: Secondary | ICD-10-CM

## 2020-01-11 DIAGNOSIS — Z87891 Personal history of nicotine dependence: Secondary | ICD-10-CM | POA: Diagnosis not present

## 2020-01-11 DIAGNOSIS — Z8249 Family history of ischemic heart disease and other diseases of the circulatory system: Secondary | ICD-10-CM

## 2020-01-11 DIAGNOSIS — I252 Old myocardial infarction: Secondary | ICD-10-CM | POA: Diagnosis not present

## 2020-01-11 DIAGNOSIS — Z79899 Other long term (current) drug therapy: Secondary | ICD-10-CM

## 2020-01-11 DIAGNOSIS — C3491 Malignant neoplasm of unspecified part of right bronchus or lung: Secondary | ICD-10-CM | POA: Diagnosis not present

## 2020-01-11 DIAGNOSIS — F329 Major depressive disorder, single episode, unspecified: Secondary | ICD-10-CM | POA: Diagnosis present

## 2020-01-11 DIAGNOSIS — Z79890 Hormone replacement therapy (postmenopausal): Secondary | ICD-10-CM

## 2020-01-11 DIAGNOSIS — J9621 Acute and chronic respiratory failure with hypoxia: Secondary | ICD-10-CM | POA: Diagnosis not present

## 2020-01-11 DIAGNOSIS — Z825 Family history of asthma and other chronic lower respiratory diseases: Secondary | ICD-10-CM

## 2020-01-11 DIAGNOSIS — R7303 Prediabetes: Secondary | ICD-10-CM | POA: Diagnosis not present

## 2020-01-11 DIAGNOSIS — R000 Tachycardia, unspecified: Secondary | ICD-10-CM | POA: Diagnosis not present

## 2020-01-11 DIAGNOSIS — J9611 Chronic respiratory failure with hypoxia: Secondary | ICD-10-CM | POA: Diagnosis not present

## 2020-01-11 DIAGNOSIS — Z85118 Personal history of other malignant neoplasm of bronchus and lung: Secondary | ICD-10-CM

## 2020-01-11 DIAGNOSIS — E872 Acidosis: Secondary | ICD-10-CM | POA: Diagnosis present

## 2020-01-11 DIAGNOSIS — A419 Sepsis, unspecified organism: Secondary | ICD-10-CM | POA: Diagnosis not present

## 2020-01-11 DIAGNOSIS — R0902 Hypoxemia: Secondary | ICD-10-CM | POA: Diagnosis not present

## 2020-01-11 DIAGNOSIS — R0602 Shortness of breath: Secondary | ICD-10-CM | POA: Diagnosis not present

## 2020-01-11 DIAGNOSIS — J9622 Acute and chronic respiratory failure with hypercapnia: Secondary | ICD-10-CM | POA: Diagnosis not present

## 2020-01-11 DIAGNOSIS — J80 Acute respiratory distress syndrome: Secondary | ICD-10-CM | POA: Diagnosis not present

## 2020-01-11 DIAGNOSIS — R069 Unspecified abnormalities of breathing: Secondary | ICD-10-CM | POA: Diagnosis not present

## 2020-01-11 DIAGNOSIS — Z9981 Dependence on supplemental oxygen: Secondary | ICD-10-CM | POA: Diagnosis not present

## 2020-01-11 DIAGNOSIS — Z7901 Long term (current) use of anticoagulants: Secondary | ICD-10-CM

## 2020-01-11 DIAGNOSIS — J441 Chronic obstructive pulmonary disease with (acute) exacerbation: Secondary | ICD-10-CM | POA: Diagnosis not present

## 2020-01-11 DIAGNOSIS — E782 Mixed hyperlipidemia: Secondary | ICD-10-CM | POA: Diagnosis present

## 2020-01-11 LAB — URINALYSIS, ROUTINE W REFLEX MICROSCOPIC
Bacteria, UA: NONE SEEN
Bilirubin Urine: NEGATIVE
Glucose, UA: NEGATIVE mg/dL
Hgb urine dipstick: NEGATIVE
Ketones, ur: NEGATIVE mg/dL
Nitrite: NEGATIVE
Protein, ur: NEGATIVE mg/dL
Specific Gravity, Urine: 1.013 (ref 1.005–1.030)
pH: 6 (ref 5.0–8.0)

## 2020-01-11 LAB — LACTIC ACID, PLASMA
Lactic Acid, Venous: 3.7 mmol/L (ref 0.5–1.9)
Lactic Acid, Venous: 3.9 mmol/L (ref 0.5–1.9)
Lactic Acid, Venous: 3.7 mmol/L (ref 0.5–1.9)
Lactic Acid, Venous: 4 mmol/L (ref 0.5–1.9)

## 2020-01-11 LAB — CBC WITH DIFFERENTIAL/PLATELET
Abs Immature Granulocytes: 0.09 10*3/uL — ABNORMAL HIGH (ref 0.00–0.07)
Basophils Absolute: 0 10*3/uL (ref 0.0–0.1)
Basophils Relative: 0 %
Eosinophils Absolute: 0 10*3/uL (ref 0.0–0.5)
Eosinophils Relative: 0 %
HCT: 37.5 % (ref 36.0–46.0)
Hemoglobin: 10.9 g/dL — ABNORMAL LOW (ref 12.0–15.0)
Immature Granulocytes: 1 %
Lymphocytes Relative: 9 %
Lymphs Abs: 0.7 10*3/uL (ref 0.7–4.0)
MCH: 25.9 pg — ABNORMAL LOW (ref 26.0–34.0)
MCHC: 29.1 g/dL — ABNORMAL LOW (ref 30.0–36.0)
MCV: 89.1 fL (ref 80.0–100.0)
Monocytes Absolute: 0.7 10*3/uL (ref 0.1–1.0)
Monocytes Relative: 9 %
Neutro Abs: 6.5 10*3/uL (ref 1.7–7.7)
Neutrophils Relative %: 81 %
Platelets: 133 10*3/uL — ABNORMAL LOW (ref 150–400)
RBC: 4.21 MIL/uL (ref 3.87–5.11)
RDW: 14.6 % (ref 11.5–15.5)
WBC: 8 10*3/uL (ref 4.0–10.5)
nRBC: 0 % (ref 0.0–0.2)

## 2020-01-11 LAB — BASIC METABOLIC PANEL
Anion gap: 9 (ref 5–15)
BUN: 21 mg/dL (ref 8–23)
CO2: 29 mmol/L (ref 22–32)
Calcium: 8 mg/dL — ABNORMAL LOW (ref 8.9–10.3)
Chloride: 104 mmol/L (ref 98–111)
Creatinine, Ser: 0.91 mg/dL (ref 0.44–1.00)
GFR calc Af Amer: >60 mL/min (ref >60)
GFR calc non Af Amer: 58 mL/min — ABNORMAL LOW (ref >60)
Glucose, Bld: 137 mg/dL — ABNORMAL HIGH (ref 70–99)
Potassium: 3.5 mmol/L (ref 3.5–5.1)
Sodium: 142 mmol/L (ref 135–145)

## 2020-01-11 LAB — BLOOD GAS, ARTERIAL
Acid-base deficit: 1.6 mmol/L (ref 0.0–2.0)
Bicarbonate: 25.2 mmol/L (ref 20.0–28.0)
O2 Saturation: 99.3 %
Patient temperature: 98.6
pCO2 arterial: 57 mmHg — ABNORMAL HIGH (ref 32.0–48.0)
pH, Arterial: 7.268 — ABNORMAL LOW (ref 7.350–7.450)
pO2, Arterial: 171 mmHg — ABNORMAL HIGH (ref 83.0–108.0)

## 2020-01-11 LAB — BLOOD GAS, VENOUS
Acid-base deficit: 0.1 mmol/L (ref 0.0–2.0)
Bicarbonate: 28.5 mmol/L — ABNORMAL HIGH (ref 20.0–28.0)
O2 Saturation: 44.8 %
Patient temperature: 98.6
pCO2, Ven: 72.4 mmHg (ref 44.0–60.0)
pH, Ven: 7.22 — ABNORMAL LOW (ref 7.250–7.430)
pO2, Ven: <31 mmHg — CL (ref 32.0–45.0)

## 2020-01-11 LAB — SARS CORONAVIRUS 2 BY RT PCR (HOSPITAL ORDER, PERFORMED IN ~~LOC~~ HOSPITAL LAB): SARS Coronavirus 2: NEGATIVE

## 2020-01-11 MED ORDER — CLONAZEPAM 0.5 MG PO TABS
0.5000 mg | ORAL_TABLET | Freq: Every day | ORAL | Status: DC
  Administered 2020-01-12 (×2): 0.5 mg via ORAL
  Filled 2020-01-11 (×2): qty 1

## 2020-01-11 MED ORDER — GABAPENTIN 300 MG PO CAPS
300.0000 mg | ORAL_CAPSULE | Freq: Two times a day (BID) | ORAL | Status: DC
  Administered 2020-01-12 – 2020-01-13 (×3): 300 mg via ORAL
  Filled 2020-01-11 (×3): qty 1

## 2020-01-11 MED ORDER — SODIUM CHLORIDE 0.9 % IV SOLN
500.0000 mg | Freq: Once | INTRAVENOUS | Status: AC
  Administered 2020-01-11: 500 mg via INTRAVENOUS
  Filled 2020-01-11: qty 500

## 2020-01-11 MED ORDER — GABAPENTIN 300 MG PO CAPS
600.0000 mg | ORAL_CAPSULE | Freq: Every day | ORAL | Status: DC
  Administered 2020-01-12 (×2): 600 mg via ORAL
  Filled 2020-01-11 (×2): qty 2

## 2020-01-11 MED ORDER — DOCUSATE SODIUM 100 MG PO CAPS
100.0000 mg | ORAL_CAPSULE | Freq: Every day | ORAL | Status: DC
  Filled 2020-01-11 (×2): qty 1

## 2020-01-11 MED ORDER — SODIUM CHLORIDE 0.9 % IV SOLN: 250.0000 mL | INTRAVENOUS | Status: DC | PRN

## 2020-01-11 MED ORDER — PANTOPRAZOLE SODIUM 40 MG PO TBEC
40.0000 mg | DELAYED_RELEASE_TABLET | Freq: Every day | ORAL | Status: DC
  Administered 2020-01-11 – 2020-01-13 (×3): 40 mg via ORAL
  Filled 2020-01-11 (×3): qty 1

## 2020-01-11 MED ORDER — DONEPEZIL HCL 10 MG PO TABS
10.0000 mg | ORAL_TABLET | Freq: Every day | ORAL | Status: DC
  Administered 2020-01-12 (×2): 10 mg via ORAL
  Filled 2020-01-11 (×2): qty 1

## 2020-01-11 MED ORDER — SODIUM CHLORIDE 0.9 % IV SOLN
1.0000 g | INTRAVENOUS | Status: DC
  Administered 2020-01-12 – 2020-01-13 (×2): 1 g via INTRAVENOUS
  Filled 2020-01-11: qty 1
  Filled 2020-01-11: qty 10

## 2020-01-11 MED ORDER — ALBUTEROL SULFATE (2.5 MG/3ML) 0.083% IN NEBU: 5.0000 mg | INHALATION_SOLUTION | Freq: Once | RESPIRATORY_TRACT | Status: DC

## 2020-01-11 MED ORDER — ACETAMINOPHEN 500 MG PO TABS: 1000.0000 mg | ORAL_TABLET | Freq: Four times a day (QID) | ORAL | Status: DC | PRN

## 2020-01-11 MED ORDER — ARFORMOTEROL TARTRATE 15 MCG/2ML IN NEBU
15.0000 ug | INHALATION_SOLUTION | Freq: Two times a day (BID) | RESPIRATORY_TRACT | Status: DC
  Administered 2020-01-11 – 2020-01-13 (×4): 15 ug via RESPIRATORY_TRACT
  Filled 2020-01-11 (×4): qty 2

## 2020-01-11 MED ORDER — LACTATED RINGERS IV BOLUS
1000.0000 mL | Freq: Once | INTRAVENOUS | Status: AC
  Administered 2020-01-11: 1000 mL via INTRAVENOUS

## 2020-01-11 MED ORDER — LEVOTHYROXINE SODIUM 25 MCG PO TABS
125.0000 ug | ORAL_TABLET | Freq: Every day | ORAL | Status: DC
  Administered 2020-01-12 – 2020-01-13 (×2): 125 ug via ORAL
  Filled 2020-01-11 (×2): qty 1

## 2020-01-11 MED ORDER — PRAVASTATIN SODIUM 20 MG PO TABS
40.0000 mg | ORAL_TABLET | Freq: Every evening | ORAL | Status: DC
  Administered 2020-01-12 (×2): 40 mg via ORAL
  Filled 2020-01-11 (×2): qty 2

## 2020-01-11 MED ORDER — SODIUM CHLORIDE 0.9% FLUSH
3.0000 mL | Freq: Two times a day (BID) | INTRAVENOUS | Status: DC
  Administered 2020-01-12 – 2020-01-13 (×4): 3 mL via INTRAVENOUS

## 2020-01-11 MED ORDER — APIXABAN 2.5 MG PO TABS
2.5000 mg | ORAL_TABLET | Freq: Every day | ORAL | Status: DC
  Administered 2020-01-12 – 2020-01-13 (×2): 2.5 mg via ORAL
  Filled 2020-01-11 (×3): qty 1

## 2020-01-11 MED ORDER — IPRATROPIUM BROMIDE 0.02 % IN SOLN
0.5000 mg | Freq: Four times a day (QID) | RESPIRATORY_TRACT | Status: DC
  Administered 2020-01-11 – 2020-01-12 (×5): 0.5 mg via RESPIRATORY_TRACT
  Filled 2020-01-11 (×4): qty 2.5

## 2020-01-11 MED ORDER — LACTATED RINGERS IV SOLN: INTRAVENOUS | Status: DC

## 2020-01-11 MED ORDER — ORAL CARE MOUTH RINSE
15.0000 mL | Freq: Two times a day (BID) | OROMUCOSAL | Status: DC
  Administered 2020-01-12 – 2020-01-13 (×3): 15 mL via OROMUCOSAL

## 2020-01-11 MED ORDER — UMECLIDINIUM-VILANTEROL 62.5-25 MCG/INH IN AEPB
1.0000 | INHALATION_SPRAY | Freq: Every day | RESPIRATORY_TRACT | Status: DC
  Administered 2020-01-11: 1 via RESPIRATORY_TRACT
  Filled 2020-01-11: qty 14

## 2020-01-11 MED ORDER — BUDESONIDE 0.5 MG/2ML IN SUSP
0.5000 mg | Freq: Two times a day (BID) | RESPIRATORY_TRACT | Status: DC
  Administered 2020-01-11 – 2020-01-13 (×4): 0.5 mg via RESPIRATORY_TRACT
  Filled 2020-01-11 (×4): qty 2

## 2020-01-11 MED ORDER — SODIUM CHLORIDE 0.9% FLUSH
3.0000 mL | Freq: Once | INTRAVENOUS | Status: AC
  Administered 2020-01-11: 3 mL via INTRAVENOUS

## 2020-01-11 MED ORDER — POTASSIUM CHLORIDE ER 10 MEQ PO TBCR
10.0000 meq | EXTENDED_RELEASE_TABLET | ORAL | Status: DC
  Filled 2020-01-11: qty 1

## 2020-01-11 MED ORDER — LEVALBUTEROL HCL 0.63 MG/3ML IN NEBU
0.6300 mg | INHALATION_SOLUTION | Freq: Three times a day (TID) | RESPIRATORY_TRACT | Status: DC
  Administered 2020-01-11: 0.63 mg via RESPIRATORY_TRACT
  Filled 2020-01-11: qty 3

## 2020-01-11 MED ORDER — VANCOMYCIN HCL 500 MG/100ML IV SOLN
500.0000 mg | Freq: Two times a day (BID) | INTRAVENOUS | Status: DC
  Administered 2020-01-12: 500 mg via INTRAVENOUS
  Filled 2020-01-11 (×2): qty 100

## 2020-01-11 MED ORDER — SODIUM CHLORIDE 0.9 % IV SOLN
1.0000 g | Freq: Once | INTRAVENOUS | Status: AC
  Administered 2020-01-11: 1 g via INTRAVENOUS
  Filled 2020-01-11: qty 10

## 2020-01-11 MED ORDER — ACETAMINOPHEN 325 MG PO TABS
650.0000 mg | ORAL_TABLET | Freq: Once | ORAL | Status: AC
  Administered 2020-01-11: 650 mg via ORAL
  Filled 2020-01-11: qty 2

## 2020-01-11 MED ORDER — SODIUM CHLORIDE 0.9 % IV SOLN
500.0000 mg | INTRAVENOUS | Status: DC
  Administered 2020-01-12: 500 mg via INTRAVENOUS
  Filled 2020-01-11 (×2): qty 500

## 2020-01-11 MED ORDER — VANCOMYCIN HCL 1250 MG/250ML IV SOLN
1250.0000 mg | INTRAVENOUS | Status: AC
  Administered 2020-01-11: 1250 mg via INTRAVENOUS
  Filled 2020-01-11: qty 250

## 2020-01-11 MED ORDER — DULOXETINE HCL 60 MG PO CPEP
60.0000 mg | ORAL_CAPSULE | Freq: Every day | ORAL | Status: DC
  Administered 2020-01-12 – 2020-01-13 (×2): 60 mg via ORAL
  Filled 2020-01-11 (×3): qty 1

## 2020-01-11 MED ORDER — SODIUM CHLORIDE 0.9 % IV SOLN: 500.0000 mg | INTRAVENOUS | Status: DC

## 2020-01-11 MED ORDER — SODIUM CHLORIDE 0.9% FLUSH: 3.0000 mL | INTRAVENOUS | Status: DC | PRN

## 2020-01-11 MED ORDER — IPRATROPIUM BROMIDE 0.02 % IN SOLN
0.5000 mg | Freq: Three times a day (TID) | RESPIRATORY_TRACT | Status: DC
  Administered 2020-01-11: 0.5 mg via RESPIRATORY_TRACT
  Filled 2020-01-11 (×2): qty 2.5

## 2020-01-11 MED ORDER — PREDNISONE 20 MG PO TABS
40.0000 mg | ORAL_TABLET | Freq: Every day | ORAL | Status: DC
  Administered 2020-01-12 – 2020-01-13 (×2): 40 mg via ORAL
  Filled 2020-01-11 (×2): qty 2

## 2020-01-11 MED ORDER — INSULIN ASPART 100 UNIT/ML ~~LOC~~ SOLN
0.0000 [IU] | Freq: Three times a day (TID) | SUBCUTANEOUS | Status: DC
  Administered 2020-01-12: 2 [IU] via SUBCUTANEOUS
  Filled 2020-01-11: qty 0.15

## 2020-01-11 MED ORDER — METOPROLOL TARTRATE 25 MG PO TABS
25.0000 mg | ORAL_TABLET | Freq: Every day | ORAL | Status: DC
  Administered 2020-01-12 – 2020-01-13 (×2): 25 mg via ORAL
  Filled 2020-01-11 (×3): qty 1

## 2020-01-11 MED ORDER — FUROSEMIDE 40 MG PO TABS
40.0000 mg | ORAL_TABLET | ORAL | Status: DC
  Filled 2020-01-11: qty 1

## 2020-01-11 MED ORDER — SODIUM CHLORIDE 0.9 % IV SOLN: 1.0000 g | INTRAVENOUS | Status: DC

## 2020-01-11 MED ORDER — VANCOMYCIN HCL 1250 MG/250ML IV SOLN: 1250.0000 mg | INTRAVENOUS | Status: DC

## 2020-01-11 MED ORDER — LACTATED RINGERS IV BOLUS
1800.0000 mL | Freq: Once | INTRAVENOUS | Status: AC
  Administered 2020-01-11: 1800 mL via INTRAVENOUS

## 2020-01-11 NOTE — Progress Notes (Signed)
Pharmacy Antibiotic Note  Brandi Raymond is a 86 y.o. female admitted on 01/11/2020 with pneumonia.  Pharmacy has been consulted for Vancomycin dosing.  Plan: Vancomycin 500mg  IV q12h Follow renal function and monitor vanomycin trough level as needed  Height: 5\' 4"  (162.6 cm) Weight: 60.3 kg (133 lb) IBW/kg (Calculated) : 54.7  Temp (24hrs), Avg:100.3 F (37.9 C), Min:99.8 F (37.7 C), Max:101 F (38.3 C)  Recent Labs  Lab 01/11/20 1001 01/11/20 1157 01/11/20 1350 01/11/20 1819  WBC 8.0  --   --   --   CREATININE 0.91  --   --   --   LATICACIDVEN 3.7* 3.9* 3.7* 4.0*    Estimated Creatinine Clearance: 39.7 mL/min (by C-G formula based on SCr of 0.91 mg/dL).    Allergies  Allergen Reactions  . Silver Sulfadiazine Other (See Comments)    lowers white blood count     Antimicrobials this admission: 6/27 Ceftriaxone >>   6/27 Azithromycin >>   6/27 Vancomycin >>  Dose adjustments this admission:   Microbiology results: 6/27 Covid 19: negative   Thank you for allowing pharmacy to be a part of this patient's care.  Everette Rank, PharmD 01/11/2020 8:52 PM

## 2020-01-11 NOTE — ED Notes (Signed)
Date and time results received: 01/11/20 11:04 AM  (use smartphrase ".now" to insert current time)  Test: Lactic Acid Critical Value: 3.7  Name of Provider Notified: Eulis Foster  Orders Received? Or Actions Taken?: Verbally notified provider
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
## 2020-01-11 NOTE — Progress Notes (Signed)
Pt assessed by RRT due to hypoxia into the 80s on 5L . WOB WNL at this time, pt placed on HFNC (salter can go up to 15L) and is currently requiring 6L to maintain SATs >85%. Pt's SATs are sensitive to exertion. No wheezing heard bilaterally but RLL coarse crackles are heard. Pt has a hx of lung CA and COPD requiring bi-pap ventilation in February of 2021. Venous blood gas sent down by ED RN, pt does not appear to need bi-pap at this time. Waiting on COVID results.

## 2020-01-11 NOTE — ED Notes (Signed)
Call to give report

## 2020-01-11 NOTE — ED Triage Notes (Signed)
[facility] transported pt from home to [facility] ED and repots the following:   SOB for two days. Daughter called EMS. Wheezing in all fields. EMS administered the following.  5 mg albuterol neb  Still had wheezing Duo neb with 5 mg albuterol, 0.5 Atrovent, and 125 mg solumedrol. 4 L O2   Wears 2 L O2 at home.

## 2020-01-11 NOTE — Progress Notes (Signed)
A consult was received from an ED physician for Vancomycin per pharmacy dosing.  The patient's profile has been reviewed for ht/wt/allergies/indication/available labs.    A one time order has been placed for Vancomycin 1250mg  IV (20mg /kg).    Further antibiotics/pharmacy consults should be ordered by admitting physician if indicated.                       Thank you, Everette Rank, PharmD 01/11/2020  12:09 PM

## 2020-01-12 DIAGNOSIS — E039 Hypothyroidism, unspecified: Secondary | ICD-10-CM

## 2020-01-12 DIAGNOSIS — J189 Pneumonia, unspecified organism: Principal | ICD-10-CM

## 2020-01-12 DIAGNOSIS — I10 Essential (primary) hypertension: Secondary | ICD-10-CM

## 2020-01-12 DIAGNOSIS — J439 Emphysema, unspecified: Secondary | ICD-10-CM

## 2020-01-12 LAB — CBC WITH DIFFERENTIAL/PLATELET
Abs Immature Granulocytes: 0.19 10*3/uL — ABNORMAL HIGH (ref 0.00–0.07)
Basophils Absolute: 0 10*3/uL (ref 0.0–0.1)
Basophils Relative: 0 %
Eosinophils Absolute: 0 10*3/uL (ref 0.0–0.5)
Eosinophils Relative: 0 %
HCT: 29.9 % — ABNORMAL LOW (ref 36.0–46.0)
Hemoglobin: 8.9 g/dL — ABNORMAL LOW (ref 12.0–15.0)
Immature Granulocytes: 1 %
Lymphocytes Relative: 9 %
Lymphs Abs: 1.3 10*3/uL (ref 0.7–4.0)
MCH: 26.6 pg (ref 26.0–34.0)
MCHC: 29.8 g/dL — ABNORMAL LOW (ref 30.0–36.0)
MCV: 89.5 fL (ref 80.0–100.0)
Monocytes Absolute: 1.5 10*3/uL — ABNORMAL HIGH (ref 0.1–1.0)
Monocytes Relative: 10 %
Neutro Abs: 11.4 10*3/uL — ABNORMAL HIGH (ref 1.7–7.7)
Neutrophils Relative %: 80 %
Platelets: 106 10*3/uL — ABNORMAL LOW (ref 150–400)
RBC: 3.34 MIL/uL — ABNORMAL LOW (ref 3.87–5.11)
RDW: 14.9 % (ref 11.5–15.5)
WBC: 14.4 10*3/uL — ABNORMAL HIGH (ref 4.0–10.5)
nRBC: 0 % (ref 0.0–0.2)

## 2020-01-12 LAB — BASIC METABOLIC PANEL
Anion gap: 4 — ABNORMAL LOW (ref 5–15)
BUN: 17 mg/dL (ref 8–23)
CO2: 30 mmol/L (ref 22–32)
Calcium: 7.5 mg/dL — ABNORMAL LOW (ref 8.9–10.3)
Chloride: 106 mmol/L (ref 98–111)
Creatinine, Ser: 0.69 mg/dL (ref 0.44–1.00)
GFR calc Af Amer: >60 mL/min (ref >60)
GFR calc non Af Amer: >60 mL/min (ref >60)
Glucose, Bld: 93 mg/dL (ref 70–99)
Potassium: 4.2 mmol/L (ref 3.5–5.1)
Sodium: 140 mmol/L (ref 135–145)

## 2020-01-12 LAB — GLUCOSE, CAPILLARY
Glucose-Capillary: 74 mg/dL (ref 70–99)
Glucose-Capillary: 100 mg/dL — ABNORMAL HIGH (ref 70–99)
Glucose-Capillary: 126 mg/dL — ABNORMAL HIGH (ref 70–99)
Glucose-Capillary: 79 mg/dL (ref 70–99)

## 2020-01-12 LAB — MRSA PCR SCREENING: MRSA by PCR: NEGATIVE

## 2020-01-12 LAB — HEMOGLOBIN A1C
Hgb A1c MFr Bld: 6 % — ABNORMAL HIGH (ref 4.8–5.6)
Mean Plasma Glucose: 125.5 mg/dL

## 2020-01-12 LAB — STREP PNEUMONIAE URINARY ANTIGEN: Strep Pneumo Urinary Antigen: NEGATIVE

## 2020-01-12 MED ORDER — POTASSIUM CHLORIDE CRYS ER 10 MEQ PO TBCR
10.0000 meq | EXTENDED_RELEASE_TABLET | ORAL | Status: DC
  Administered 2020-01-12: 10 meq via ORAL
  Filled 2020-01-12 (×2): qty 1

## 2020-01-12 MED ORDER — FUROSEMIDE 40 MG PO TABS
40.0000 mg | ORAL_TABLET | ORAL | Status: DC
  Administered 2020-01-12: 40 mg via ORAL
  Filled 2020-01-12 (×2): qty 1

## 2020-01-12 NOTE — Evaluation (Signed)
Physical Therapy Evaluation Patient Details Name: Brandi Raymond MRN: [id number] DOB: 1933-08-12 Today's Date: 01/12/2020   History of Present Illness  86 yo female admitted with Pna, COPD exac. Hx of COPD-O2 dep, pacemaker, CAD, HB  Clinical Impression  On eval, pt was Supv for mobility. She walked ~125 feet with a rollator. She also walked to/from bathroom without a device. O2 92% on 2L [place] during session. Pt plans to return home where she lives with her husband. Will follow during hospital stay. Do not anticipate any follow up PT needs after discharged-pt agrees.     Follow Up Recommendations No PT follow up    Equipment Recommendations  None recommended by PT    Recommendations for Other Services       Precautions / Restrictions Precautions Precautions: Fall Precaution Comments: O2 dep @ baseline-2L Restrictions Weight Bearing Restrictions: No      Mobility  Bed Mobility               General bed mobility comments: oob in recliner  Transfers Overall transfer level: Needs assistance Equipment used: 4-wheeled walker;None Transfers: Sit to/from Stand Sit to Stand: Supervision            Ambulation/Gait Ambulation/Gait assistance: Supervision Gait Distance (Feet): 125 Feet Assistive device: 4-wheeled walker Gait Pattern/deviations: Step-through pattern;Decreased stride length     General Gait Details: O2 92% on 2L [place].  Stairs            Wheelchair Mobility    Modified Rankin (Stroke Patients Only)       Balance                                             Pertinent Vitals/Pain Pain Assessment: No/denies pain    Home Living Family/patient expects to be discharged to:: Private residence Living Arrangements: Spouse/significant other Available Help at Discharge: Family;Available 24 hours/day Type of Home: House Home Access: Stairs to enter Entrance Stairs-Rails: Right Entrance Stairs-Number of Steps: 3 Home Layout:  One level Home Equipment: Walker - 4 wheels;Bedside commode Additional Comments: uses 3 in 1 as shower seat    Prior Function Level of Independence: Independent with assistive device(s)         Comments: has a rollator but doesn't really use it. walks around house without any device.     Hand Dominance        Extremity/Trunk Assessment   Upper Extremity Assessment Upper Extremity Assessment: Overall WFL for tasks assessed    Lower Extremity Assessment Lower Extremity Assessment: Generalized weakness    Cervical / Trunk Assessment Cervical / Trunk Assessment: Normal  Communication   Communication: No difficulties  Cognition Arousal/Alertness: Awake/alert Behavior During Therapy: WFL for tasks assessed/performed Overall Cognitive Status: Within Functional Limits for tasks assessed                                        General Comments      Exercises     Assessment/Plan    PT Assessment Patient needs continued PT services  PT Problem List Decreased mobility;Decreased activity tolerance       PT Treatment Interventions Gait training;Therapeutic activities;Therapeutic exercise;Patient/family education;Functional mobility training    PT Goals (Current goals can be found in the Care Plan section)  Acute Rehab PT  Goals Patient Stated Goal: home PT Goal Formulation: With patient Time For Goal Achievement: 01/26/20 Potential to Achieve Goals: Good    Frequency Min 3X/week   Barriers to discharge        Co-evaluation               AM-PAC PT "6 Clicks" Mobility  Outcome Measure Help needed turning from your back to your side while in a flat bed without using bedrails?: None Help needed moving from lying on your back to sitting on the side of a flat bed without using bedrails?: None Help needed moving to and from a bed to a chair (including a wheelchair)?: None Help needed standing up from a chair using your arms (e.g., wheelchair or  bedside chair)?: None Help needed to walk in hospital room?: A Little Help needed climbing 3-5 steps with a railing? : A Little 6 Click Score: 22    End of Session Equipment Utilized During Treatment: Oxygen Activity Tolerance: Patient tolerated treatment well Patient left: in chair;with call bell/phone within reach;with chair alarm set   PT Visit Diagnosis: Difficulty in walking, not elsewhere classified (R26.2)    Time: 6812-7517 PT Time Calculation (min) (ACUTE ONLY): 19 min   Charges:   PT Evaluation $PT Eval Low Complexity: 1 Low             Doreatha Massed, PT Acute Rehabilitation  Office: [phone] Pager: [phone]

## 2020-01-12 NOTE — TOC Initial Note (Signed)
Transition of Care [facility]) - Initial/Assessment Note    Patient Details  Name: Brandi Raymond MRN: [id number] Date of Birth: January 16, 1934  Transition [facility]) CM/SW Contact:    Lynnell Catalan, RN Phone Number: 01/12/2020, 3:40 PM  Clinical Narrative:                 Pt from home with spouse. Also has 3 supportive daughters. Pt currently receives home 02 from [employer]. TOC consult for home humidification for her 02. Order received and [employer] liaison contacted. PT/OT consults pending. TOC will continue to follow.  Expected Discharge Plan: Home/Self Care Barriers to Discharge: Continued Medical Work up  Expected Discharge Plan and Services Expected Discharge Plan: Home/Self Care       Living arrangements for the past 2 months: Single Family Home                 DME Arranged: Other see comment (Home humidification for her 56) DME Agency: [employer] Date DME Agency Contacted: 01/12/20 Time DME Agency Contacted: [id number] Representative spoke with at DME Agency: [place] Arrangements/Services Living arrangements for the past 2 months: [place] with:: Spouse                   Activities of Daily Living Home Assistive Devices/Equipment: Dentures (specify type), Eyeglasses ADL Screening (condition at time of admission) Patient's cognitive ability adequate to safely complete daily activities?: Yes Is the patient deaf or have difficulty hearing?: No Does the patient have difficulty seeing, even when wearing glasses/contacts?: No Does the patient have difficulty concentrating, remembering, or making decisions?: No Patient able to express need for assistance with ADLs?: Yes Does the patient have difficulty dressing or bathing?: No Independently performs ADLs?: Yes (appropriate for developmental age) Does the patient have difficulty walking or climbing stairs?: No Weakness of Legs: None Weakness of Arms/Hands: Both  Permission  Sought/Granted                  Emotional Assessment              Admission diagnosis:  CAP (community acquired pneumonia) [J18.9] Community acquired pneumonia of right lower lobe of lung [J18.9] Sepsis without acute organ dysfunction, due to unspecified organism [facility]) [A41.9] Patient Active Problem List   Diagnosis Date Noted  . CAP (community acquired pneumonia) 01/11/2020  . Acute respiratory failure ([place]) 09/02/2019  . Acute diastolic CHF (congestive heart failure) ([place]) 09/02/2019  . Paroxysmal atrial fibrillation (HCC)   . Second degree Mobitz II AV block 08/21/2019  . Second degree AV block 08/08/2019  . SOB (shortness of breath) 08/05/2019  . Chest tightness 08/05/2019  . GERD (gastroesophageal reflux disease) 05/21/2019  . Chronic respiratory failure with hypoxia ([place]) 05/21/2019  . Medication management 05/21/2019  . Stage I squamous cell carcinoma of right lung ([place]) 02/06/2019  . Constipation 09/11/2018  . COPD with acute exacerbation ([place]) 09/04/2018  . Right lower lobe lung mass 09/04/2018  . Closed compression fracture of L1 lumbar vertebra, initial encounter ([place]) 09/04/2018  . Preoperative clearance 07/19/2018  . Lower back pain 06/25/2018  . Cubital tunnel syndrome on left 05/22/2018  . Dupuytren's contracture of both hands 05/10/2018  . Primary osteoarthritis of both knees 05/10/2018  . Difficulty urinating 05/10/2018  . Osteoporosis 06/14/2017  . Prediabetes 06/23/2015  . Depression 06/23/2015  . Carotid stenosis 10/12/2014  . Sleep disorder 04/09/2014  . Mesenteric artery  stenosis ([place]) 01/13/2013  . Thoracic aneurysm without mention of rupture 11/04/2012  . Chronic mesenteric ischemia ([place]) 10/08/2012  . Memory deficit 06/20/2012  . Irritable bowel syndrome 06/20/2012  . Ocular myasthenia gravis ([place]) 06/20/2012  . PVD (peripheral vascular disease) ([place]) 06/20/2012  . Hereditary and idiopathic peripheral neuropathy 05/22/2012  . Syncope  08/28/2011  . ABDOMINAL BRUIT 08/17/2009  . Coronary atherosclerosis 09/05/2008  . Hypothyroidism 05/26/2008  . VITAMIN D DEFICIENCY 05/26/2008  . CHOLELITHIASIS 12/06/2007  . DIVERTICULOSIS, COLON 12/05/2007  . HYPERLIPIDEMIA 08/19/2007  . COPD (chronic obstructive pulmonary disease) with emphysema ([place]) 08/19/2007  . CIGARETTE SMOKER 02/06/2007  . Essential hypertension 02/06/2007  . COLONIC POLYPS 11/21/2004   PCP:  Binnie Rail, MD Pharmacy:   [id number] - [place], [place] [address]  [address] Phone: [phone] Fax: [phone]  [facility] [place], [place] - [address] [place] [address] Phone: [phone] Fax: [phone]     Social Determinants of Health (SDOH) Interventions    Readmission Risk Interventions Readmission Risk Prevention Plan 01/12/2020  PCP or Specialist Appt within 3-5 Days Complete  HRI or [place] Complete  Social Work Consult for [place] Planning/Counseling Complete  Palliative Care Screening Not Applicable  Medication Review (RN Care Manager) Complete  Some recent data might be hidden
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
## 2020-01-12 NOTE — Progress Notes (Addendum)
PROGRESS NOTE    Brandi Raymond  TOI:[id number] DOB: November 02, 1933 DOA: 01/11/2020 PCP: Binnie Rail, MD   Brief Narrative: Brandi Raymond is a 86 y.o. 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 Patient presented secondary to dyspnea and cough with concern for pneumonia/COPD exacerbation causing acute hypoxia and hypercapnia. Patient was started on antibiotics, oxygen, steroids.   Assessment & Plan:   Active Problems:   Hypothyroidism   HYPERLIPIDEMIA   Essential hypertension   COPD (chronic obstructive pulmonary disease) with emphysema (HCC)   Memory deficit   Prediabetes   GERD (gastroesophageal reflux disease)   Paroxysmal atrial fibrillation (HCC)   CAP (community acquired pneumonia)   Acute on chronic respiratory failure with hypoxia Acute respiratory failure with hypercapnia Secondary to COPD exacerbation and possible community acquired pneumonia. Patient with an initial ABG significant for pH of 7.268, pCO of 57, pO2 of 171. BiPAP considered but not initiated. Weaned to home oxygen level of 2L via [place].  Community acquired pneumonia RLL pneumonia Started empirically on vancomycin, Ceftriaxone, Azithromycin. MRSA PCR negative. No fever, leukocytosis or productive cough. Infiltrate seen on chest x-ray. -Discontinue Vancomycin -Continue Ceftriaxone, Azithromycin  COPD exacerbation Started on prednisone and Atrovent scheduled. Pulmonology consulted on admission. -Pulmonology recommendations: prednisone 40 mg x5 days, atrovent neb with transition to home [place] on 6/29; patient with hospital follow-up on 7/15 @ 9:30AM  Essential hypertension -Continue metoprolol (patient is on metoprolol tartrate only once daily as an outpatient)  Prediabetes Hemoglobin A1C of 6.0% -Continue SSI  Paroxysmal atrial fibrillation -Continue metoprolol and Eliquis  Hypothyroidism -Continue  Synthroid  Hyperlipidemia -Continue pravastatin  Leukocytosis After steroids. Outpatient follow-up.  Lactic acidosis In setting of albuterol usage. No evidence of infection/end organ damage.   DVT prophylaxis: Eliquis Code Status:   Code Status: Full Code Family Communication: Husband and daughter at bedside Disposition Plan: Discharge likely home in one day pending transition to home COPD regimen and PT/OT recommendations   Consultants:   Pulmonology  Procedures:   None  Antimicrobials:  Vancomycin  Ceftriaxone  Azithromycin    Subjective: No issues overnight. No shortness of breath. Mild non-productive cough.  Objective: Vitals:   01/12/20 0750 01/12/20 0832 01/12/20 1246 01/12/20 1421  BP:  (!) 142/66 (!) 123/99   Pulse:  99 75   Resp:  15 15   Temp:  98.4 F (36.9 C) 98.3 F (36.8 C)   TempSrc:  Oral Oral   SpO2: 100% 100% (!) 85% 100%  Weight:      Height:        Intake/Output Summary (Last 24 hours) at 01/12/2020 1517 Last data filed at 01/12/2020 1325 Gross per 24 hour  Intake 480 ml  Output --  Net 480 ml   Filed Weights   01/11/20 0932  Weight: 60.3 kg    Examination:  General exam: Appears calm and comfortable Respiratory system: End expiratory wheeze. Respiratory effort normal. Cardiovascular system: S1 & S2 heard, RRR. No murmurs, rubs, gallops or clicks. Gastrointestinal system: Abdomen is nondistended, soft and nontender. No organomegaly or masses felt. Normal bowel sounds heard. Central nervous system: Alert and oriented. No focal neurological deficits. Musculoskeletal: No edema. No calf tenderness Skin: No cyanosis. No rashes Psychiatry: Judgement and insight appear normal. Mood & affect appropriate.     Data Reviewed: I have personally reviewed following labs and imaging studies  CBC Lab Results  Component Value Date  WBC 14.4 (H) 01/12/2020   RBC 3.34 (L) 01/12/2020   HGB 8.9 (L) 01/12/2020   HCT 29.9 (L)  01/12/2020   MCV 89.5 01/12/2020   MCH 26.6 01/12/2020   PLT 106 (L) 01/12/2020   MCHC 29.8 (L) 01/12/2020   RDW 14.9 01/12/2020   LYMPHSABS 1.3 01/12/2020   MONOABS 1.5 (H) 01/12/2020   EOSABS 0.0 01/12/2020   BASOSABS 0.0 24/03/7352     Last metabolic panel Lab Results  Component Value Date   NA 140 01/12/2020   K 4.2 01/12/2020   CL 106 01/12/2020   CO2 30 01/12/2020   BUN 17 01/12/2020   CREATININE 0.69 01/12/2020   GLUCOSE 93 01/12/2020   GFRNONAA >60 01/12/2020   GFRAA >60 01/12/2020   CALCIUM 7.5 (L) 01/12/2020   PROT 6.3 (L) 11/18/2019   ALBUMIN 3.2 (L) 11/18/2019   BILITOT 0.4 11/18/2019   ALKPHOS 77 11/18/2019   AST 24 11/18/2019   ALT 13 11/18/2019   ANIONGAP 4 (L) 01/12/2020    CBG (last 3)  Recent Labs    01/12/20 0726 01/12/20 1136  GLUCAP 74 100*     GFR: Estimated Creatinine Clearance: 45.2 mL/min (by C-G formula based on SCr of 0.69 mg/dL).  Coagulation Profile: No results for input(s): INR, PROTIME in the last 168 hours.  Recent Results (from the past 240 hour(s))  SARS Coronavirus 2 by RT PCR (hospital order, performed in [facility] hospital lab) Nasopharyngeal Nasopharyngeal Swab     Status: None   Collection Time: 01/11/20 10:01 AM   Specimen: Nasopharyngeal Swab  Result Value Ref Range Status   SARS Coronavirus 2 NEGATIVE NEGATIVE Final    Comment: (NOTE) SARS-CoV-2 target nucleic acids are NOT DETECTED.  The SARS-CoV-2 RNA is generally detectable in upper and lower respiratory specimens during the acute phase of infection. The lowest concentration of SARS-CoV-2 viral copies this assay can detect is 250 copies / mL. A negative result does not preclude SARS-CoV-2 infection and should not be used as the sole basis for treatment or other patient management decisions.  A negative result may occur with improper specimen collection / handling, submission of specimen other than nasopharyngeal swab, presence of viral mutation(s) within  the areas targeted by this assay, and inadequate number of viral copies (<250 copies / mL). A negative result must be combined with clinical observations, patient history, and epidemiological information.  Fact Sheet for Patients:   StrictlyIdeas.no  Fact Sheet for Healthcare Providers: BankingDealers.co.za  This test is not yet approved or  cleared by the [place] FDA and has been authorized for detection and/or diagnosis of SARS-CoV-2 by FDA under an Emergency Use Authorization (EUA).  This EUA will remain in effect (meaning this test can be used) for the duration of the COVID-19 declaration under Section 564(b)(1) of the Act, 21 U.S.C. section 360bbb-3(b)(1), unless the authorization is terminated or revoked sooner.  Performed at [facility], [place] [address], [place], [place] [address]   Culture, blood (routine x 2)     Status: None (Preliminary result)   Collection Time: 01/11/20 10:05 AM   Specimen: BLOOD  Result Value Ref Range Status   Specimen Description   Final    BLOOD RIGHT ANTECUBITAL Performed at [place] [address], [place],  [address]    Special Requests   Final    BOTTLES DRAWN AEROBIC AND ANAEROBIC Blood Culture adequate volume Performed at [facility] [address] [place], [place]  [address]    Culture   Final    NO GROWTH < 12 HOURS Performed at [facility] [address] [place], [place] [address]    Report Status PENDING  Incomplete  Culture, blood (routine x 2)     Status: None (Preliminary result)   Collection Time: 01/11/20 10:30 AM   Specimen: BLOOD  Result Value Ref Range Status   Specimen Description   Final    BLOOD LEFT ANTECUBITAL Performed at [facility] [address] [place], [place] [address]    Special Requests   Final    BOTTLES DRAWN AEROBIC AND ANAEROBIC Blood Culture  results may not be optimal due to an inadequate volume of blood received in culture bottles Performed at [facility] [address] [place], [place] [address]    Culture   Final    NO GROWTH < 12 HOURS Performed at [facility] [address] [place], [place] [address]    Report Status PENDING  Incomplete  MRSA PCR Screening     Status: None   Collection Time: 01/12/20 11:00 AM   Specimen: Nasopharyngeal  Result Value Ref Range Status   MRSA by PCR NEGATIVE NEGATIVE Final    Comment:        The GeneXpert MRSA Assay (FDA approved for NASAL specimens only), is one component of a comprehensive MRSA colonization surveillance program. It is not intended to diagnose MRSA infection nor to guide or monitor treatment for MRSA infections. Performed at [facility], [place] [address], [place], [place] [address]         Radiology Studies: DG Chest 2 View  Result Date: 01/11/2020 CLINICAL DATA:  Shortness of breath and wheezing for 2 days EXAM: CHEST - 2 VIEW COMPARISON:  11/18/2019 chest CT FINDINGS: Increased right perihilar density. The patient was stage by CT last month. Retrocardiac opacity from hiatal hernia. Stable heart size and aortic contours. Dual-chamber pacer leads from the left. No visible effusion or pneumothorax. IMPRESSION: Right base infiltrate superimposed on post treatment scarring. Recommend follow-up to radiographic baseline. Electronically Signed   By: Monte Fantasia M.D.   On: 01/11/2020 11:23        Scheduled Meds: . apixaban  2.5 mg Oral Daily  . arformoterol  15 mcg Nebulization BID  . budesonide (PULMICORT) nebulizer solution  0.5 mg Nebulization BID  . clonazePAM  0.5 mg Oral QHS  . docusate sodium  100 mg Oral QHS  . donepezil  10 mg Oral QHS  . DULoxetine  60 mg Oral Daily  . potassium chloride  10 mEq Oral QODAY   And  . furosemide  40 mg Oral QODAY  . gabapentin  300 mg Oral BID WC  . gabapentin   600 mg Oral QHS  . insulin aspart  0-15 Units Subcutaneous TID WC  . ipratropium  0.5 mg Nebulization Q6H  . levothyroxine  125 mcg Oral Daily  . mouth rinse  15 mL Mouth Rinse BID  . metoprolol tartrate  25 mg Oral Daily  . pantoprazole  40 mg Oral Daily  . pravastatin  40 mg Oral QPM  . predniSONE  40 mg Oral Q breakfast  . sodium chloride flush  3 mL Intravenous Q12H   Continuous Infusions: . sodium chloride    . azithromycin 500 mg (01/12/20 1457)  . cefTRIAXone (ROCEPHIN)  IV 1 g (01/12/20 1258)     LOS: 1 day     Deidre Ala  Lonny Prude, MD [facility] 01/12/2020, 3:17 PM  If 7PM-7AM, please contact night-coverage [email or web address]

## 2020-01-13 DIAGNOSIS — R7303 Prediabetes: Secondary | ICD-10-CM

## 2020-01-13 DIAGNOSIS — I480 Paroxysmal atrial fibrillation: Secondary | ICD-10-CM

## 2020-01-13 DIAGNOSIS — J189 Pneumonia, unspecified organism: Secondary | ICD-10-CM

## 2020-01-13 LAB — GLUCOSE, CAPILLARY
Glucose-Capillary: 97 mg/dL (ref 70–99)
Glucose-Capillary: 113 mg/dL — ABNORMAL HIGH (ref 70–99)

## 2020-01-13 LAB — LEGIONELLA PNEUMOPHILA SEROGP 1 UR AG: L. pneumophila Serogp 1 Ur Ag: NEGATIVE

## 2020-01-13 MED ORDER — AZITHROMYCIN 250 MG PO TABS
500.0000 mg | ORAL_TABLET | Freq: Every day | ORAL | Status: DC
  Administered 2020-01-13: 500 mg via ORAL
  Filled 2020-01-13: qty 2

## 2020-01-13 MED ORDER — PREDNISONE 20 MG PO TABS: 40.0000 mg | ORAL_TABLET | Freq: Every day | ORAL | 0 refills | Status: AC

## 2020-01-13 MED ORDER — GABAPENTIN 300 MG PO CAPS: 300.0000 mg | ORAL_CAPSULE | Freq: Three times a day (TID) | ORAL | Status: DC

## 2020-01-13 MED ORDER — IPRATROPIUM BROMIDE 0.02 % IN SOLN: 0.5000 mg | Freq: Four times a day (QID) | RESPIRATORY_TRACT | 0 refills | Status: DC | PRN

## 2020-01-13 MED ORDER — UMECLIDINIUM-VILANTEROL 62.5-25 MCG/INH IN AEPB
1.0000 | INHALATION_SPRAY | Freq: Every day | RESPIRATORY_TRACT | Status: DC
  Administered 2020-01-13: 1 via RESPIRATORY_TRACT
  Filled 2020-01-13: qty 14

## 2020-01-13 MED ORDER — AZITHROMYCIN 250 MG PO TABS: 250.0000 mg | ORAL_TABLET | Freq: Every day | ORAL | 0 refills | Status: AC

## 2020-01-13 MED ORDER — IPRATROPIUM BROMIDE 0.02 % IN SOLN: 0.5000 mg | Freq: Four times a day (QID) | RESPIRATORY_TRACT | Status: DC | PRN

## 2020-01-13 MED ORDER — CEFDINIR 300 MG PO CAPS
300.0000 mg | ORAL_CAPSULE | Freq: Two times a day (BID) | ORAL | 0 refills | Status: AC
Start: 2020-01-14 — End: 2020-01-17

## 2020-01-13 NOTE — Discharge Instructions (Signed)
Brandi Raymond,  You were in the hospital with pneumonia and COPD. These were treated with antibiotics, breathing treatments and steroids. Please continue your antibiotics and steroids on discharge. Please have a repeat chest x-ray in 6-8 weeks. Please use your flutter valve at home.

## 2020-01-13 NOTE — TOC Transition Note (Signed)
Transition of Care [facility]) - CM/SW Discharge Note   Patient Details  Name: Brandi Raymond MRN: [id number] Date of Birth: 08-Feb-1934  Transition of Care [facility]) CM/SW Contact:  Lynnell Catalan, RN Phone Number: 01/13/2020, 12:45 PM   Clinical Narrative:     [employer] liaison was contacted for nebulizer machine for home. RN made aware.                DME Arranged: Other see comment (Home humidification for her 65) DME Agency: [employer] Date DME Agency Contacted: 01/12/20 Time DME Agency Contacted: 1597 Representative spoke with at DME Agency: [place] ([place]) Interventions     Readmission Risk Interventions Readmission Risk Prevention Plan 01/12/2020  PCP or Specialist Appt within 3-5 Days Complete  HRI or [place] Complete  Social Work Consult for [place] Planning/Counseling Complete  Palliative Care Screening Not Applicable  Medication Review Press photographer) Complete  Some recent data might be hidden

## 2020-01-13 NOTE — Evaluation (Signed)
Occupational Therapy Evaluation Patient Details Name: Brandi Raymond MRN: [id number] DOB: [birth year] Today's Date: 01/13/2020    History of Present Illness 86 yo female admitted with Pna, COPD exac. Hx of COPD-O2 dep, pacemaker, CAD, HB   Clinical Impression   Pt admitted with the above. Pt currently with functional limitations due to the deficits listed below (see OT Problem List).  Pt will benefit from skilled OT to increase their safety and independence with ADL and functional mobility for ADL to facilitate discharge to venue listed below.      Follow Up Recommendations  Home health OT;Supervision/Assistance - 24 hour    Equipment Recommendations  None recommended by OT       Precautions / Restrictions Precautions Precautions: Fall Precaution Comments: O2 dep @ baseline-2L      Mobility Bed Mobility               General bed mobility comments: oob in recliner  Transfers Overall transfer level: Needs assistance Equipment used: None;Rolling walker (2 wheeled) Transfers: Sit to/from Stand Sit to Stand: Supervision              Balance Overall balance assessment: Mild deficits observed, not formally tested                                         ADL either performed or assessed with clinical judgement   ADL Overall ADL's : Needs assistance/impaired Eating/Feeding: Set up;Sitting   Grooming: Set up;Sitting   Upper Body Bathing: Set up;Sitting   Lower Body Bathing: Minimal assistance;Sit to/from stand;Cueing for safety;Cueing for compensatory techniques;Cueing for sequencing   Upper Body Dressing : Set up;Sitting   Lower Body Dressing: Minimal assistance;Sit to/from stand;Cueing for safety;Cueing for compensatory techniques;Cueing for sequencing   Toilet Transfer: Minimal assistance;Stand-pivot;Squat-pivot   Toileting- Clothing Manipulation and Hygiene: Minimal assistance;Sit to/from stand       Functional mobility during ADLs:  Min guard;Rolling walker       Vision Patient Visual Report: No change from baseline              Pertinent Vitals/Pain Pain Assessment: No/denies pain        Extremity/Trunk Assessment         Cervical / Trunk Assessment Cervical / Trunk Assessment: Normal   Communication Communication Communication: No difficulties   Cognition Arousal/Alertness: Awake/alert Behavior During Therapy: WFL for tasks assessed/performed Overall Cognitive Status: Within Functional Limits for tasks assessed                                                Home Living Family/patient expects to be discharged to:: Private residence Living Arrangements: Spouse/significant other Available Help at Discharge: Family;Available 24 hours/day Type of Home: House Home Access: Stairs to enter [employer] of Steps: 3 Entrance Stairs-Rails: Right Home Layout: One level     Bathroom Shower/Tub: Occupational psychologist: Standard     Home Equipment: Environmental consultant - 4 wheels;Bedside commode   Additional Comments: uses 3 in 1 as shower seat      Prior Functioning/Environment Level of Independence: Independent with assistive device(s)        Comments: has a rollator but doesn't really use it. walks around house without any device.  OT Problem List: Decreased strength      OT Treatment/Interventions:      OT Goals(Current goals can be found in the care plan section) Acute Rehab OT Goals Patient Stated Goal: home OT Goal Formulation: With patient  OT Frequency:      AM-PAC OT "6 Clicks" Daily Activity     Outcome Measure Help from another person eating meals?: A Little Help from another person taking care of personal grooming?: A Little Help from another person toileting, which includes using toliet, bedpan, or urinal?: A Little Help from another person bathing (including washing, rinsing, drying)?: A Little Help from another person to put on and  taking off regular upper body clothing?: A Little Help from another person to put on and taking off regular lower body clothing?: A Little 6 Click Score: 18   End of Session Nurse Communication: Mobility status  Activity Tolerance: Patient tolerated treatment well Patient left: in chair;with call bell/phone within reach  OT Visit Diagnosis: Unsteadiness on feet (R26.81);Other abnormalities of gait and mobility (R26.89)                Time: 1040-1108 OT Time Calculation (min): 28 min Charges:  OT General Charges $OT Visit: 1 Visit OT Evaluation $OT Eval Moderate Complexity: 1 Mod OT Treatments $Self Care/Home Management : 8-22 mins  Kari Baars, OT Acute Rehabilitation Services [phone] Office- [phone], Edwena Felty D 01/13/2020, 3:31 PM

## 2020-01-13 NOTE — Discharge Summary (Signed)
Physician Discharge Summary  Brandi Raymond:[id number] DOB: 10/26/1933 DOA: 01/11/2020  PCP: Binnie Rail, MD  Admit date: 01/11/2020 Discharge date: 01/13/2020  Admitted From: Home Disposition: Home  Recommendations for Outpatient Follow-up:  1. Follow up with PCP in 1 week 2. Please obtain a repeat chest x-ray in 6-8 weeks to ensure radiographic resolution of pneumonia 3. Please follow up on the following pending results: Blood cultures  Home Health: None Equipment/Devices: Nebulizer  Discharge Condition: Stable CODE STATUS: Full code Diet recommendation: Heart healthy   Brief/Interim Summary:  Admission HPI written by Neena Rhymes, MD   Chief Complaint: Increasing SOB, cough  HPI: Brandi Raymond is a 86 y.o. 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 Denies fever, rigors. Has continue on home oxygen. Due to her symptoms she presents to [facility] for evaluation  ED Course: In ED febrile to 101, tachypnic, hypoxemic. X-ray with RLL infiltrate in area of scarring from XRT. Lab revealed  Nl WBC with mild left shift, persistently elevated Lactic acid: 3.9-3.7-4.0. Code Sepsis initiated: patient received 2L LR, Abx with Vanc, Ceftriaxone, Azithromycin. CCM consulted but opined that patient did not require ICU admission and deferred to medicine. [facility] called to admit patient to hospital.    Hospital course:  Acute on chronic respiratory failure with hypoxia Acute respiratory failure with hypercapnia Secondary to COPD exacerbation and possible community acquired pneumonia. Patient with an initial ABG significant for pH of 7.268, pCO of 57, pO2 of 171. BiPAP considered but not initiated. Weaned to home oxygen level of 2L via [place].  Community acquired pneumonia RLL pneumonia Started empirically on vancomycin,  Ceftriaxone, Azithromycin. MRSA PCR negative. No fever, leukocytosis or productive cough. Infiltrate seen on chest x-ray. Vancomycin discontinued and patient transitioned to Cefdinir and Azithromycin for discharge to complete 5 days of antibiotic therapy.  COPD exacerbation Started on prednisone and Atrovent scheduled. Pulmonology consulted on admission with recommendations for prednisone 40 mg x5 days, atrovent neb with transition to home regimen on discharge. Patient provided with a nebulizer machine and Atrovent neb prn. Pulmonology follow-up  Essential hypertension -Continue metoprolol (patient is on metoprolol tartrate only once daily as an outpatient)  Prediabetes Hemoglobin A1C of 6.0% Sliding scale while inpatient. Diet control on discharge.  Paroxysmal atrial fibrillation Continue metoprolol and Eliquis  Hypothyroidism Continue Synthroid  Hyperlipidemia Continue pravastatin  Leukocytosis After steroids. Outpatient follow-up.  Lactic acidosis In setting of albuterol usage. No evidence of infection/end organ damage.  Discharge Diagnoses:  Active Problems:   Hypothyroidism   HYPERLIPIDEMIA   Essential hypertension   COPD (chronic obstructive pulmonary disease) with emphysema (HCC)   Memory deficit   Prediabetes   GERD (gastroesophageal reflux disease)   Paroxysmal atrial fibrillation (HCC)   CAP (community acquired pneumonia)    Discharge Instructions  Discharge Instructions    Call MD for:  difficulty breathing, headache or visual disturbances   Complete by: As directed    Call MD for:  severe uncontrolled pain   Complete by: As directed    Increase activity slowly   Complete by: As directed      Allergies as of 01/13/2020      Reactions   Silver Sulfadiazine Other (See Comments)   lowers white blood count       Medication List    TAKE these medications  acetaminophen 500 MG tablet Commonly known as: TYLENOL Take 1,000 mg by mouth every 6  (six) hours as needed for mild pain.   Anoro Ellipta 62.5-25 MCG/INH Aepb Generic drug: umeclidinium-vilanterol INHALE 1 PUFF BY MOUTH EVERY DAY What changed:   how much to take  how to take this  when to take this  additional instructions   apixaban 2.5 MG Tabs tablet Commonly known as: ELIQUIS Take 1 tablet (2.5 mg total) by mouth 2 (two) times daily. What changed: when to take this   azithromycin 250 MG tablet Commonly known as: ZITHROMAX Take 1 tablet (250 mg total) by mouth daily for 2 days. Start taking on: January 14, 2020   CALCIUM 1200+D3 PO Take 1 tablet by mouth at bedtime.   cefdinir 300 MG capsule Commonly known as: OMNICEF Take 1 capsule (300 mg total) by mouth 2 (two) times daily for 3 days. Start taking on: January 14, 2020   clonazePAM 0.5 MG tablet Commonly known as: KLONOPIN TAKE 1 TABLET BY MOUTH EVERYDAY AT BEDTIME What changed: See the new instructions.   docusate sodium 100 MG capsule Commonly known as: COLACE Take 100 mg by mouth at bedtime.   donepezil 10 MG tablet Commonly known as: ARICEPT TAKE 1 TABLET BY MOUTH EVERYDAY AT BEDTIME What changed:   how much to take  how to take this  when to take this  additional instructions   DULoxetine 60 MG capsule Commonly known as: CYMBALTA TAKE 1 CAPSULE BY MOUTH EVERY DAY What changed: how much to take   furosemide 40 MG tablet Commonly known as: LASIX Take 1 tablet (40 mg total) by mouth every other day.   gabapentin 300 MG capsule Commonly known as: NEURONTIN Take 1 capsule (300 mg total) by mouth 3 (three) times daily. What changed: See the new instructions.   ipratropium 0.02 % nebulizer solution Commonly known as: ATROVENT Take 2.5 mLs (0.5 mg total) by nebulization every 6 (six) hours as needed for wheezing or shortness of breath.   levothyroxine 125 MCG tablet Commonly known as: SYNTHROID TAKE 1 TABLET (125 MCG TOTAL) BY MOUTH DAILY BEFORE BREAKFAST. What changed: when to  take this   metoprolol tartrate 25 MG tablet Commonly known as: LOPRESSOR Take 1 tablet (25 mg total) by mouth daily.   pantoprazole 40 MG tablet Commonly known as: PROTONIX Take 1 tablet (40 mg total) by mouth daily.   potassium chloride 10 MEQ tablet Commonly known as: KLOR-CON Take 1 tablet 10 meq every other day with Lasix. What changed:   how much to take  how to take this  when to take this  additional instructions   pravastatin 40 MG tablet Commonly known as: PRAVACHOL Take 1 tablet (40 mg total) by mouth daily. Follow-up appt w/labs are due must see provider for future refills What changed:   when to take this  additional instructions   predniSONE 20 MG tablet Commonly known as: DELTASONE Take 2 tablets (40 mg total) by mouth daily with breakfast for 2 days. Start taking on: January 14, 2020   PRESERVISION AREDS 2 PO Take 1 capsule by mouth 2 (two) times daily.   Prolia 60 MG/ML Sosy injection Generic drug: denosumab Inject 60 mg into the skin every 6 (six) months.            Durable Medical Equipment  (From admission, onward)         Start     Ordered   01/13/20 0941  For home use  only DME Nebulizer machine  Once       Question Answer Comment  Patient needs a nebulizer to treat with the following condition COPD exacerbation ([place])   Length of Need Lifetime      01/13/20 0940   01/12/20 1526  For home use only DME Other see comment  Once       Comments: Humidification for home 02  Question:  Length of Need  Answer:  Lifetime   01/12/20 [place]          Follow-up Information    Martyn Ehrich, NP Follow up on 01/29/2020.   Specialty: Pulmonary Disease Why: [facility] 9:30 Contact information: [address] [place] [address] [phone]              Allergies  Allergen Reactions  . Silver Sulfadiazine Other (See Comments)    lowers white blood count      Consultations:  Pulmonology   Procedures/Studies: DG Chest 2 View  Result Date: 01/11/2020 CLINICAL DATA:  Shortness of breath and wheezing for 2 days EXAM: CHEST - 2 VIEW COMPARISON:  11/18/2019 chest CT FINDINGS: Increased right perihilar density. The patient was stage by CT last month. Retrocardiac opacity from hiatal hernia. Stable heart size and aortic contours. Dual-chamber pacer leads from the left. No visible effusion or pneumothorax. IMPRESSION: Right base infiltrate superimposed on post treatment scarring. Recommend follow-up to radiographic baseline. Electronically Signed   By: Monte Fantasia M.D.   On: 01/11/2020 11:23      Subjective: Breathing much better. Coughing improved. Some productive cough.  Discharge Exam: Vitals:   01/13/20 0623 01/13/20 0831  BP: (!) 160/95   Pulse: 85 88  Resp: 18   Temp: 97.8 F (36.6 C)   SpO2: 98% 99%   Vitals:   01/12/20 1942 01/12/20 2029 01/13/20 0623 01/13/20 0831  BP:  (!) 145/75 (!) 160/95   Pulse:  79 85 88  Resp:  14 18   Temp:  98.7 F (37.1 C) 97.8 F (36.6 C)   TempSrc:  Oral Oral   SpO2: 100% 98% 98% 99%  Weight:      Height:        General: Pt is alert, awake, not in acute distress Cardiovascular: RRR, S1/S2 +, no rubs, no gallops Respiratory: Expiratory wheeze, no rales Abdominal: Soft, NT, ND, bowel sounds + Extremities: no edema, no cyanosis    The results of significant diagnostics from this hospitalization (including imaging, microbiology, ancillary and laboratory) are listed below for reference.     Microbiology: Recent Results (from the past 240 hour(s))  SARS Coronavirus 2 by RT PCR (hospital order, performed in [facility] hospital lab) Nasopharyngeal Nasopharyngeal Swab     Status: None   Collection Time: 01/11/20 10:01 AM   Specimen: Nasopharyngeal Swab  Result Value Ref Range Status   SARS Coronavirus 2 NEGATIVE NEGATIVE Final    Comment: (NOTE) SARS-CoV-2 target nucleic acids are  NOT DETECTED.  The SARS-CoV-2 RNA is generally detectable in upper and lower respiratory specimens during the acute phase of infection. The lowest concentration of SARS-CoV-2 viral copies this assay can detect is 250 copies / mL. A negative result does not preclude SARS-CoV-2 infection and should not be used as the sole basis for treatment or other patient management decisions.  A negative result may occur with improper specimen collection / handling, submission of specimen other than nasopharyngeal swab, presence of viral mutation(s) within the areas targeted by this assay, and  inadequate number of viral copies (<250 copies / mL). A negative result must be combined with clinical observations, patient history, and epidemiological information.  Fact Sheet for Patients:   StrictlyIdeas.no  Fact Sheet for Healthcare Providers: BankingDealers.co.za  This test is not yet approved or  cleared by the [place] FDA and has been authorized for detection and/or diagnosis of SARS-CoV-2 by FDA under an Emergency Use Authorization (EUA).  This EUA will remain in effect (meaning this test can be used) for the duration of the COVID-19 declaration under Section 564(b)(1) of the Act, 21 U.S.C. section 360bbb-3(b)(1), unless the authorization is terminated or revoked sooner.  Performed at [facility], [place] [address], [place], [place] [address]   Culture, blood (routine x 2)     Status: None (Preliminary result)   Collection Time: 01/11/20 10:05 AM   Specimen: BLOOD  Result Value Ref Range Status   Specimen Description   Final    BLOOD RIGHT ANTECUBITAL Performed at [place] [address], [place], [place] [address]    Special Requests   Final    BOTTLES DRAWN AEROBIC AND ANAEROBIC Blood Culture adequate volume Performed at [place] [address], [place], [place] [address]     Culture   Final    NO GROWTH < 24 HOURS Performed at [place] [address] [place], [place] [address]    Report Status PENDING  Incomplete  Culture, blood (routine x 2)     Status: None (Preliminary result)   Collection Time: 01/11/20 10:30 AM   Specimen: BLOOD  Result Value Ref Range Status   Specimen Description   Final    BLOOD LEFT ANTECUBITAL Performed at [facility] [address], [place], [place] [address]    Special Requests   Final    BOTTLES DRAWN AEROBIC AND ANAEROBIC Blood Culture results may not be optimal due to an inadequate volume of blood received in culture bottles Performed at [place] [address] [place], [place] [address]    Culture   Final    NO GROWTH < 24 HOURS Performed at [place] [address] [place], [place] [address]    Report Status PENDING  Incomplete  MRSA PCR Screening     Status: None   Collection Time: 01/12/20 11:00 AM   Specimen: Nasopharyngeal  Result Value Ref Range Status   MRSA by PCR NEGATIVE NEGATIVE Final    Comment:        The GeneXpert MRSA Assay (FDA approved for NASAL specimens only), is one component of a comprehensive MRSA colonization surveillance program. It is not intended to diagnose MRSA infection nor to guide or monitor treatment for MRSA infections. Performed at [facility], [place] [address], [place], [place] [address]      Labs: BNP (last 3 results) Recent Labs    09/02/19 0520  BNP 488.8*   Basic Metabolic Panel: Recent Labs  Lab 01/11/20 1001 01/12/20 0613  NA 142 140  K 3.5 4.2  CL 104 106  CO2 29 30  GLUCOSE 137* 93  BUN 21 17  CREATININE 0.91 0.69  CALCIUM 8.0* 7.5*   Liver Function Tests: No results for input(s): AST, ALT, ALKPHOS, BILITOT, PROT, ALBUMIN in the last 168 hours. No results for input(s): LIPASE, AMYLASE in the last 168 hours. No results for input(s): AMMONIA in the last 168  hours. CBC: Recent Labs  Lab 01/11/20  1001 01/12/20 0613  WBC 8.0 14.4*  NEUTROABS 6.5 11.4*  HGB 10.9* 8.9*  HCT 37.5 29.9*  MCV 89.1 89.5  PLT 133* 106*   Cardiac Enzymes: No results for input(s): CKTOTAL, CKMB, CKMBINDEX, TROPONINI in the last 168 hours. BNP: Invalid input(s): POCBNP CBG: Recent Labs  Lab 01/12/20 0726 01/12/20 1136 01/12/20 1700 01/12/20 2017 01/13/20 0726  GLUCAP 74 100* 126* 79 97   D-Dimer No results for input(s): DDIMER in the last 72 hours. Hgb A1c Recent Labs    01/11/20 1001  HGBA1C 6.0*   Lipid Profile No results for input(s): CHOL, HDL, LDLCALC, TRIG, CHOLHDL, LDLDIRECT in the last 72 hours. Thyroid function studies No results for input(s): TSH, T4TOTAL, T3FREE, THYROIDAB in the last 72 hours.  Invalid input(s): FREET3 Anemia work up No results for input(s): VITAMINB12, FOLATE, FERRITIN, TIBC, IRON, RETICCTPCT in the last 72 hours. Urinalysis    Component Value Date/Time   COLORURINE YELLOW 01/11/2020 1001   APPEARANCEUR CLEAR 01/11/2020 1001   LABSPEC 1.013 01/11/2020 1001   PHURINE 6.0 01/11/2020 1001   GLUCOSEU NEGATIVE 01/11/2020 1001   GLUCOSEU NEGATIVE 05/30/2018 1139   HGBUR NEGATIVE 01/11/2020 1001   HGBUR negative 07/22/2010 0853   BILIRUBINUR NEGATIVE 01/11/2020 1001   BILIRUBINUR Neg 04/03/2012 1431   KETONESUR NEGATIVE 01/11/2020 1001   PROTEINUR NEGATIVE 01/11/2020 1001   UROBILINOGEN 0.2 05/30/2018 1139   NITRITE NEGATIVE 01/11/2020 1001   LEUKOCYTESUR MODERATE (A) 01/11/2020 1001   Sepsis Labs Invalid input(s): PROCALCITONIN,  WBC,  LACTICIDVEN Microbiology Recent Results (from the past 240 hour(s))  SARS Coronavirus 2 by RT PCR (hospital order, performed in [place] hospital lab) Nasopharyngeal Nasopharyngeal Swab     Status: None   Collection Time: 01/11/20 10:01 AM   Specimen: Nasopharyngeal Swab  Result Value Ref Range Status   SARS Coronavirus 2 NEGATIVE NEGATIVE Final    Comment:  (NOTE) SARS-CoV-2 target nucleic acids are NOT DETECTED.  The SARS-CoV-2 RNA is generally detectable in upper and lower respiratory specimens during the acute phase of infection. The lowest concentration of SARS-CoV-2 viral copies this assay can detect is 250 copies / mL. A negative result does not preclude SARS-CoV-2 infection and should not be used as the sole basis for treatment or other patient management decisions.  A negative result may occur with improper specimen collection / handling, submission of specimen other than nasopharyngeal swab, presence of viral mutation(s) within the areas targeted by this assay, and inadequate number of viral copies (<250 copies / mL). A negative result must be combined with clinical observations, patient history, and epidemiological information.  Fact Sheet for Patients:   StrictlyIdeas.no  Fact Sheet for Healthcare Providers: BankingDealers.co.za  This test is not yet approved or  cleared by the [place] FDA and has been authorized for detection and/or diagnosis of SARS-CoV-2 by FDA under an Emergency Use Authorization (EUA).  This EUA will remain in effect (meaning this test can be used) for the duration of the COVID-19 declaration under Section 564(b)(1) of the Act, 21 U.S.C. section 360bbb-3(b)(1), unless the authorization is terminated or revoked sooner.  Performed at [facility], [place] [address] [place], [place] [address]   Culture, blood (routine x 2)     Status: None (Preliminary result)   Collection Time: 01/11/20 10:05 AM   Specimen: BLOOD  Result Value Ref Range Status   Specimen Description   Final    BLOOD RIGHT ANTECUBITAL Performed at [place] [address]  [place], [place] [address]    Special Requests   Final    BOTTLES DRAWN AEROBIC AND ANAEROBIC Blood Culture adequate volume Performed at [place] [address] [place], [place] [address]    Culture   Final    NO GROWTH < 24 HOURS Performed at [place] [address], [place], [place] [address]    Report Status PENDING  Incomplete  Culture, blood (routine x 2)     Status: None (Preliminary result)   Collection Time: 01/11/20 10:30 AM   Specimen: BLOOD  Result Value Ref Range Status   Specimen Description   Final    BLOOD LEFT ANTECUBITAL Performed at [place] [address], [place], [place] [address]    Special Requests   Final    BOTTLES DRAWN AEROBIC AND ANAEROBIC Blood Culture results may not be optimal due to an inadequate volume of blood received in culture bottles Performed at [place] [address] [place], [place] [address]    Culture   Final    NO GROWTH < 24 HOURS Performed at [place] [address] [place], [place] [address]    Report Status PENDING  Incomplete  MRSA PCR Screening     Status: None   Collection Time: 01/12/20 11:00 AM   Specimen: Nasopharyngeal  Result Value Ref Range Status   MRSA by PCR NEGATIVE NEGATIVE Final    Comment:        The GeneXpert MRSA Assay (FDA approved for NASAL specimens only), is one component of a comprehensive MRSA colonization surveillance program. It is not intended to diagnose MRSA infection nor to guide or monitor treatment for MRSA infections. Performed at [facility], [place] [address], [place],  [address]      Time coordinating discharge: 35 minutes  SIGNED:   Cordelia Poche, MD [facility] 01/13/2020, 10:52 AM

## 2020-01-13 NOTE — Progress Notes (Signed)
PHARMACIST - PHYSICIAN COMMUNICATION  CONCERNING: Antibiotic IV to Oral Route Change Policy  RECOMMENDATION: This patient is receiving azithromycin by the intravenous route.  Based on criteria approved by the Pharmacy and Therapeutics Committee, the antibiotic(s) is/are being converted to the equivalent oral dose form(s).   DESCRIPTION: These criteria include:  Patient being treated for a respiratory tract infection, urinary tract infection, cellulitis or clostridium difficile associated diarrhea if on metronidazole  The patient is not neutropenic and does not exhibit a GI malabsorption state  The patient is eating (either orally or via tube) and/or has been taking other orally administered medications for a least 24 hours  The patient is improving clinically and has a Tmax < 100.5  If you have questions about this conversion, please contact the Pharmacy Department  []  ( [phone] )  [place] []  ( [phone]  [facility] []  ( [phone]  [place] []  ( [phone] )  [facility] [x]  ( [phone] )  [facility]  

## 2020-01-15 ENCOUNTER — Other Ambulatory Visit: Payer: Self-pay | Admitting: Student

## 2020-01-16 NOTE — Telephone Encounter (Signed)
Prescription refill request for Eliquis received.  Last office visit: [place] 12/05/2019 Scr: 0.69, 01/12/2020 Age:  86 y.o. Weight: 60.3 kg    Spoke with Melissa pharm D, Pt continue Eliquis 2.5 mg BID, Prescription refill sent.

## 2020-01-17 LAB — CULTURE, BLOOD (ROUTINE X 2)
Culture: NO GROWTH
Culture: NO GROWTH
Special Requests: ADEQUATE

## 2020-01-21 ENCOUNTER — Telehealth: Payer: Self-pay

## 2020-01-21 NOTE — Telephone Encounter (Addendum)
**Note De-Identified  Obfuscation** Per [facility] message from the pt sent on 01/11/2020 stating that she cannot afford Eliquis I called [facility] and was advised that the cost to pt for Eliquis has gone up in price from last refill for $45/ 30 day supply to now $123.46/30 day supply and that she maybe in the donut hole.  I attempted a Elquis tier exception through covermymeds: Key: [id number] but received a message that Elquis is covered under the pts plan but a tier exception is denied as it is a name brand medication already at the lowest tier possible.  I called the pt but got no answer so I left a message on her VM asking her to call Jeani Hawking at [phone] and advising her to check her [facility] for a message from me concerning her Eliquis.    :

## 2020-01-21 NOTE — Telephone Encounter (Signed)
**Note De-Identified  Obfuscation** The pts daughter [name] called me back to discuss the pt switching from Eliquis to Plaxix. I explained that Eliquis is a anticoagulant and that Plavix is a antiplatelet and that Plavix does not treat A-fib. She verbalized understanding and thanked me for explaining the difference between the two so she can help the pt understand.  She is requesting Eliquis samples as the pt is almost out and states that she is contacting [employer] as soon as we end our call. I advised her that we will leave her mother some Eliquis samples in the front office for them to pick up.  She is aware to complete the application, obtain required documents per [facility] and to drop all off at the office so we can take care of the provider page and fax all to [employer] Pt Asst program.

## 2020-01-25 ENCOUNTER — Other Ambulatory Visit: Payer: Self-pay | Admitting: Internal Medicine

## 2020-01-26 ENCOUNTER — Telehealth: Payer: Self-pay | Admitting: Cardiovascular Disease

## 2020-01-26 NOTE — Telephone Encounter (Signed)
LVM for patient to return call to get appointment scheduled with Dr.Berry from patients recall lists

## 2020-01-27 ENCOUNTER — Other Ambulatory Visit: Payer: Self-pay | Admitting: Internal Medicine

## 2020-01-27 DIAGNOSIS — K2100 Gastro-esophageal reflux disease with esophagitis, without bleeding: Secondary | ICD-10-CM

## 2020-01-29 ENCOUNTER — Other Ambulatory Visit: Payer: Self-pay

## 2020-01-29 ENCOUNTER — Ambulatory Visit (INDEPENDENT_AMBULATORY_CARE_PROVIDER_SITE_OTHER): Payer: PPO

## 2020-01-29 ENCOUNTER — Encounter: Payer: Self-pay | Admitting: Internal Medicine

## 2020-01-29 ENCOUNTER — Ambulatory Visit: Payer: PPO | Admitting: Primary Care

## 2020-01-29 ENCOUNTER — Encounter: Payer: Self-pay | Admitting: Primary Care

## 2020-01-29 VITALS — BP 114/60 | HR 66 | Temp 97.8°F | Ht 64.0 in

## 2020-01-29 DIAGNOSIS — J189 Pneumonia, unspecified organism: Secondary | ICD-10-CM | POA: Diagnosis not present

## 2020-01-29 DIAGNOSIS — J441 Chronic obstructive pulmonary disease with (acute) exacerbation: Secondary | ICD-10-CM

## 2020-01-29 DIAGNOSIS — J439 Emphysema, unspecified: Secondary | ICD-10-CM

## 2020-01-29 DIAGNOSIS — R0602 Shortness of breath: Secondary | ICD-10-CM | POA: Diagnosis not present

## 2020-01-29 DIAGNOSIS — I5031 Acute diastolic (congestive) heart failure: Secondary | ICD-10-CM | POA: Diagnosis not present

## 2020-01-29 DIAGNOSIS — J449 Chronic obstructive pulmonary disease, unspecified: Secondary | ICD-10-CM | POA: Diagnosis not present

## 2020-01-29 DIAGNOSIS — R42 Dizziness and giddiness: Secondary | ICD-10-CM | POA: Diagnosis not present

## 2020-01-29 DIAGNOSIS — K2100 Gastro-esophageal reflux disease with esophagitis, without bleeding: Secondary | ICD-10-CM

## 2020-01-29 LAB — CBC WITH DIFFERENTIAL/PLATELET
Basophils Absolute: 0.1 10*3/uL (ref 0.0–0.1)
Basophils Relative: 1.1 % (ref 0.0–3.0)
Eosinophils Absolute: 0.2 10*3/uL (ref 0.0–0.7)
Eosinophils Relative: 2.6 % (ref 0.0–5.0)
HCT: 32.7 % — ABNORMAL LOW (ref 36.0–46.0)
Hemoglobin: 10.3 g/dL — ABNORMAL LOW (ref 12.0–15.0)
Lymphocytes Relative: 17.9 % (ref 12.0–46.0)
Lymphs Abs: 1.3 10*3/uL (ref 0.7–4.0)
MCHC: 31.5 g/dL (ref 30.0–36.0)
MCV: 84.2 fl (ref 78.0–100.0)
Monocytes Absolute: 1 10*3/uL (ref 0.1–1.0)
Monocytes Relative: 14.4 % — ABNORMAL HIGH (ref 3.0–12.0)
Neutro Abs: 4.6 10*3/uL (ref 1.4–7.7)
Neutrophils Relative %: 64 % (ref 43.0–77.0)
Platelets: 201 10*3/uL (ref 150.0–400.0)
RBC: 3.88 Mil/uL (ref 3.87–5.11)
RDW: 16.5 % — ABNORMAL HIGH (ref 11.5–15.5)
WBC: 7.1 10*3/uL (ref 4.0–10.5)

## 2020-01-29 LAB — BASIC METABOLIC PANEL
BUN: 20 mg/dL (ref 6–23)
CO2: 34 mEq/L — ABNORMAL HIGH (ref 19–32)
Calcium: 8.4 mg/dL (ref 8.4–10.5)
Chloride: 98 mEq/L (ref 96–112)
Creatinine, Ser: 0.98 mg/dL (ref 0.40–1.20)
GFR: 54 mL/min — ABNORMAL LOW (ref >60.00)
Glucose, Bld: 102 mg/dL — ABNORMAL HIGH (ref 70–99)
Potassium: 4.2 mEq/L (ref 3.5–5.1)
Sodium: 138 mEq/L (ref 135–145)

## 2020-01-29 LAB — BRAIN NATRIURETIC PEPTIDE: Pro B Natriuretic peptide (BNP): 220 pg/mL — ABNORMAL HIGH (ref 0.0–100.0)

## 2020-01-29 MED ORDER — PANTOPRAZOLE SODIUM 40 MG PO TBEC: 40.0000 mg | DELAYED_RELEASE_TABLET | Freq: Every day | ORAL | 1 refills | Status: DC

## 2020-01-29 NOTE — Patient Instructions (Signed)
Pleasure meeting you today Ms. Speas  Recommendations Hold Lasix tomorrow until we speak Encourage you push oral fluids Orthostatic blood pressure today looked fine Recommend you use walker when you ambulate   COPD Continue Anoro Ellipta 1 puff daily in the morning Use Atrovent nebulizer every 6 hours as needed for breakthrough shortness of breath or wheezing Use incentive spirometer 5 to 10 breaths every hour as able Take Mucinex 600 mg 1 to 2 tablets twice daily with a full glass of water for 10 days  Orders Chest x-ray today Labs (CBC with differential, BNP, be met)  Follow-up 2 weeks telephone visit with [facility] NP

## 2020-01-29 NOTE — Progress Notes (Signed)
Please let patient know CXR showed improving right lower lobe pneumonia. We will need repeat CXR in 2-4 weeks (tbd at follow-up)
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
## 2020-01-30 ENCOUNTER — Other Ambulatory Visit: Payer: Self-pay | Admitting: Internal Medicine

## 2020-01-30 NOTE — Progress Notes (Signed)
Please let patient know labs looked ok. Hgb was baseline 10.3, WBC was normal. Potassium, calcium and kidney function fine. BNP was elevated at 220, would resume lasix tomorrow if she is feeling better. May want to follow up with cardiology if weakness/dizziness persists.

## 2020-01-30 NOTE — Telephone Encounter (Signed)
Done erx 

## 2020-02-03 ENCOUNTER — Encounter: Payer: Self-pay | Admitting: Adult Health

## 2020-02-03 NOTE — Telephone Encounter (Signed)
If TID is working well then I would just stay at that dose

## 2020-02-04 MED ORDER — GABAPENTIN 300 MG PO CAPS: 300.0000 mg | ORAL_CAPSULE | Freq: Four times a day (QID) | ORAL | 5 refills | Status: DC

## 2020-02-04 NOTE — Telephone Encounter (Signed)
Order sent.

## 2020-02-08 DIAGNOSIS — J441 Chronic obstructive pulmonary disease with (acute) exacerbation: Secondary | ICD-10-CM | POA: Diagnosis not present

## 2020-02-12 ENCOUNTER — Ambulatory Visit: Payer: PPO | Admitting: Primary Care

## 2020-02-12 ENCOUNTER — Encounter (HOSPITAL_COMMUNITY): Payer: Self-pay

## 2020-02-12 ENCOUNTER — Other Ambulatory Visit: Payer: Self-pay

## 2020-02-12 ENCOUNTER — Emergency Department (HOSPITAL_COMMUNITY): Payer: PPO

## 2020-02-12 ENCOUNTER — Inpatient Hospital Stay (HOSPITAL_COMMUNITY)
Admission: EM | Admit: 2020-02-12 | Discharge: 2020-02-16 | DRG: 871 | Disposition: A | Discharge status: Home Health | Payer: PPO | Attending: Internal Medicine | Admitting: Internal Medicine

## 2020-02-12 DIAGNOSIS — R7303 Prediabetes: Secondary | ICD-10-CM | POA: Diagnosis not present

## 2020-02-12 DIAGNOSIS — J9622 Acute and chronic respiratory failure with hypercapnia: Secondary | ICD-10-CM | POA: Diagnosis not present

## 2020-02-12 DIAGNOSIS — F419 Anxiety disorder, unspecified: Secondary | ICD-10-CM | POA: Diagnosis present

## 2020-02-12 DIAGNOSIS — E860 Dehydration: Secondary | ICD-10-CM | POA: Diagnosis present

## 2020-02-12 DIAGNOSIS — Z8249 Family history of ischemic heart disease and other diseases of the circulatory system: Secondary | ICD-10-CM

## 2020-02-12 DIAGNOSIS — J9621 Acute and chronic respiratory failure with hypoxia: Secondary | ICD-10-CM | POA: Diagnosis present

## 2020-02-12 DIAGNOSIS — J9611 Chronic respiratory failure with hypoxia: Secondary | ICD-10-CM | POA: Diagnosis not present

## 2020-02-12 DIAGNOSIS — E039 Hypothyroidism, unspecified: Secondary | ICD-10-CM | POA: Diagnosis not present

## 2020-02-12 DIAGNOSIS — J189 Pneumonia, unspecified organism: Secondary | ICD-10-CM | POA: Diagnosis present

## 2020-02-12 DIAGNOSIS — E785 Hyperlipidemia, unspecified: Secondary | ICD-10-CM | POA: Diagnosis present

## 2020-02-12 DIAGNOSIS — F0390 Unspecified dementia without behavioral disturbance: Secondary | ICD-10-CM | POA: Diagnosis not present

## 2020-02-12 DIAGNOSIS — R0902 Hypoxemia: Secondary | ICD-10-CM | POA: Diagnosis not present

## 2020-02-12 DIAGNOSIS — J9602 Acute respiratory failure with hypercapnia: Secondary | ICD-10-CM | POA: Diagnosis not present

## 2020-02-12 DIAGNOSIS — G7000 Myasthenia gravis without (acute) exacerbation: Secondary | ICD-10-CM | POA: Diagnosis not present

## 2020-02-12 DIAGNOSIS — J690 Pneumonitis due to inhalation of food and vomit: Secondary | ICD-10-CM | POA: Diagnosis present

## 2020-02-12 DIAGNOSIS — J439 Emphysema, unspecified: Secondary | ICD-10-CM | POA: Diagnosis present

## 2020-02-12 DIAGNOSIS — E876 Hypokalemia: Secondary | ICD-10-CM | POA: Diagnosis not present

## 2020-02-12 DIAGNOSIS — H0520 Unspecified exophthalmos: Secondary | ICD-10-CM | POA: Diagnosis not present

## 2020-02-12 DIAGNOSIS — K449 Diaphragmatic hernia without obstruction or gangrene: Secondary | ICD-10-CM | POA: Diagnosis present

## 2020-02-12 DIAGNOSIS — J9601 Acute respiratory failure with hypoxia: Secondary | ICD-10-CM | POA: Diagnosis not present

## 2020-02-12 DIAGNOSIS — R1110 Vomiting, unspecified: Secondary | ICD-10-CM

## 2020-02-12 DIAGNOSIS — I252 Old myocardial infarction: Secondary | ICD-10-CM

## 2020-02-12 DIAGNOSIS — R0602 Shortness of breath: Secondary | ICD-10-CM | POA: Diagnosis not present

## 2020-02-12 DIAGNOSIS — Z833 Family history of diabetes mellitus: Secondary | ICD-10-CM

## 2020-02-12 DIAGNOSIS — Z808 Family history of malignant neoplasm of other organs or systems: Secondary | ICD-10-CM

## 2020-02-12 DIAGNOSIS — Z923 Personal history of irradiation: Secondary | ICD-10-CM

## 2020-02-12 DIAGNOSIS — Z66 Do not resuscitate: Secondary | ICD-10-CM | POA: Diagnosis present

## 2020-02-12 DIAGNOSIS — G9341 Metabolic encephalopathy: Secondary | ICD-10-CM | POA: Diagnosis not present

## 2020-02-12 DIAGNOSIS — R0600 Dyspnea, unspecified: Secondary | ICD-10-CM

## 2020-02-12 DIAGNOSIS — J441 Chronic obstructive pulmonary disease with (acute) exacerbation: Secondary | ICD-10-CM

## 2020-02-12 DIAGNOSIS — Z20822 Contact with and (suspected) exposure to covid-19: Secondary | ICD-10-CM | POA: Diagnosis present

## 2020-02-12 DIAGNOSIS — I700 Atherosclerosis of aorta: Secondary | ICD-10-CM | POA: Diagnosis not present

## 2020-02-12 DIAGNOSIS — Z85118 Personal history of other malignant neoplasm of bronchus and lung: Secondary | ICD-10-CM

## 2020-02-12 DIAGNOSIS — I10 Essential (primary) hypertension: Secondary | ICD-10-CM | POA: Diagnosis present

## 2020-02-12 DIAGNOSIS — R0689 Other abnormalities of breathing: Secondary | ICD-10-CM | POA: Diagnosis not present

## 2020-02-12 DIAGNOSIS — A419 Sepsis, unspecified organism: Principal | ICD-10-CM | POA: Diagnosis present

## 2020-02-12 DIAGNOSIS — Z800 Family history of malignant neoplasm of digestive organs: Secondary | ICD-10-CM

## 2020-02-12 DIAGNOSIS — I4892 Unspecified atrial flutter: Secondary | ICD-10-CM | POA: Diagnosis not present

## 2020-02-12 DIAGNOSIS — I5031 Acute diastolic (congestive) heart failure: Secondary | ICD-10-CM | POA: Diagnosis not present

## 2020-02-12 DIAGNOSIS — I2510 Atherosclerotic heart disease of native coronary artery without angina pectoris: Secondary | ICD-10-CM | POA: Diagnosis not present

## 2020-02-12 DIAGNOSIS — Z95810 Presence of automatic (implantable) cardiac defibrillator: Secondary | ICD-10-CM

## 2020-02-12 DIAGNOSIS — I480 Paroxysmal atrial fibrillation: Secondary | ICD-10-CM | POA: Diagnosis present

## 2020-02-12 DIAGNOSIS — J9690 Respiratory failure, unspecified, unspecified whether with hypoxia or hypercapnia: Secondary | ICD-10-CM | POA: Diagnosis not present

## 2020-02-12 DIAGNOSIS — M47816 Spondylosis without myelopathy or radiculopathy, lumbar region: Secondary | ICD-10-CM | POA: Diagnosis not present

## 2020-02-12 DIAGNOSIS — C3491 Malignant neoplasm of unspecified part of right bronchus or lung: Secondary | ICD-10-CM | POA: Diagnosis not present

## 2020-02-12 DIAGNOSIS — J449 Chronic obstructive pulmonary disease, unspecified: Secondary | ICD-10-CM | POA: Diagnosis not present

## 2020-02-12 DIAGNOSIS — Z79890 Hormone replacement therapy (postmenopausal): Secondary | ICD-10-CM

## 2020-02-12 DIAGNOSIS — Z87891 Personal history of nicotine dependence: Secondary | ICD-10-CM

## 2020-02-12 DIAGNOSIS — R000 Tachycardia, unspecified: Secondary | ICD-10-CM | POA: Diagnosis not present

## 2020-02-12 DIAGNOSIS — Z9981 Dependence on supplemental oxygen: Secondary | ICD-10-CM

## 2020-02-12 DIAGNOSIS — Z90710 Acquired absence of both cervix and uterus: Secondary | ICD-10-CM

## 2020-02-12 DIAGNOSIS — Z825 Family history of asthma and other chronic lower respiratory diseases: Secondary | ICD-10-CM

## 2020-02-12 DIAGNOSIS — Z79899 Other long term (current) drug therapy: Secondary | ICD-10-CM

## 2020-02-12 LAB — BASIC METABOLIC PANEL
Anion gap: 12 (ref 5–15)
BUN: 18 mg/dL (ref 8–23)
CO2: 29 mmol/L (ref 22–32)
Calcium: 8.4 mg/dL — ABNORMAL LOW (ref 8.9–10.3)
Chloride: 102 mmol/L (ref 98–111)
Creatinine, Ser: 1.14 mg/dL — ABNORMAL HIGH (ref 0.44–1.00)
GFR calc Af Amer: 51 mL/min — ABNORMAL LOW (ref >60)
GFR calc non Af Amer: 44 mL/min — ABNORMAL LOW (ref >60)
Glucose, Bld: 168 mg/dL — ABNORMAL HIGH (ref 70–99)
Potassium: 4.1 mmol/L (ref 3.5–5.1)
Sodium: 143 mmol/L (ref 135–145)

## 2020-02-12 LAB — CBC WITH DIFFERENTIAL/PLATELET
Abs Immature Granulocytes: 0.05 10*3/uL (ref 0.00–0.07)
Basophils Absolute: 0 10*3/uL (ref 0.0–0.1)
Basophils Relative: 0 %
Eosinophils Absolute: 0.1 10*3/uL (ref 0.0–0.5)
Eosinophils Relative: 1 %
HCT: 39.2 % (ref 36.0–46.0)
Hemoglobin: 10.9 g/dL — ABNORMAL LOW (ref 12.0–15.0)
Immature Granulocytes: 1 %
Lymphocytes Relative: 18 %
Lymphs Abs: 1.8 10*3/uL (ref 0.7–4.0)
MCH: 25.9 pg — ABNORMAL LOW (ref 26.0–34.0)
MCHC: 27.8 g/dL — ABNORMAL LOW (ref 30.0–36.0)
MCV: 93.1 fL (ref 80.0–100.0)
Monocytes Absolute: 0.7 10*3/uL (ref 0.1–1.0)
Monocytes Relative: 7 %
Neutro Abs: 7.4 10*3/uL (ref 1.7–7.7)
Neutrophils Relative %: 73 %
Platelets: 154 10*3/uL (ref 150–400)
RBC: 4.21 MIL/uL (ref 3.87–5.11)
RDW: 14.6 % (ref 11.5–15.5)
WBC: 10.2 10*3/uL (ref 4.0–10.5)
nRBC: 0 % (ref 0.0–0.2)

## 2020-02-12 LAB — SARS CORONAVIRUS 2 BY RT PCR (DIASORIN): SARS Coronavirus 2: NEGATIVE

## 2020-02-12 LAB — URINALYSIS, ROUTINE W REFLEX MICROSCOPIC
Bilirubin Urine: NEGATIVE
Glucose, UA: NEGATIVE mg/dL
Hgb urine dipstick: NEGATIVE
Ketones, ur: NEGATIVE mg/dL
Leukocytes,Ua: NEGATIVE
Nitrite: NEGATIVE
Protein, ur: NEGATIVE mg/dL
Specific Gravity, Urine: 1.012 (ref 1.005–1.030)
pH: 6 (ref 5.0–8.0)

## 2020-02-12 LAB — I-STAT VENOUS BLOOD GAS, ED
Acid-Base Excess: 3 mmol/L — ABNORMAL HIGH (ref 0.0–2.0)
Bicarbonate: 32.2 mmol/L — ABNORMAL HIGH (ref 20.0–28.0)
Calcium, Ion: 1.06 mmol/L — ABNORMAL LOW (ref 1.15–1.40)
HCT: 36 % (ref 36.0–46.0)
Hemoglobin: 12.2 g/dL (ref 12.0–15.0)
O2 Saturation: 84 %
Potassium: 4.1 mmol/L (ref 3.5–5.1)
Sodium: 142 mmol/L (ref 135–145)
TCO2: 34 mmol/L — ABNORMAL HIGH (ref 22–32)
pCO2, Ven: 70.9 mmHg (ref 44.0–60.0)
pH, Ven: 7.265 (ref 7.250–7.430)
pO2, Ven: 58 mmHg — ABNORMAL HIGH (ref 32.0–45.0)

## 2020-02-12 LAB — BLOOD GAS, VENOUS
Acid-Base Excess: 3.3 mmol/L — ABNORMAL HIGH (ref 0.0–2.0)
Bicarbonate: 30 mmol/L — ABNORMAL HIGH (ref 20.0–28.0)
Drawn by: 1359
FIO2: 28
O2 Saturation: 29.2 %
Patient temperature: 36.9
pCO2, Ven: 71 mmHg (ref 44.0–60.0)
pH, Ven: 7.249 — ABNORMAL LOW (ref 7.250–7.430)
pO2, Ven: <30 mmHg — CL (ref 32.0–45.0)

## 2020-02-12 LAB — TROPONIN I (HIGH SENSITIVITY)
Troponin I (High Sensitivity): 11 ng/L (ref <18)
Troponin I (High Sensitivity): 14 ng/L (ref <18)

## 2020-02-12 LAB — LACTIC ACID, PLASMA
Lactic Acid, Venous: 2.8 mmol/L (ref 0.5–1.9)
Lactic Acid, Venous: 1.8 mmol/L (ref 0.5–1.9)

## 2020-02-12 LAB — BRAIN NATRIURETIC PEPTIDE: B Natriuretic Peptide: 248.9 pg/mL — ABNORMAL HIGH (ref 0.0–100.0)

## 2020-02-12 MED ORDER — SODIUM CHLORIDE 0.9 % IV SOLN
3.0000 g | Freq: Three times a day (TID) | INTRAVENOUS | Status: DC
  Administered 2020-02-12 (×2): 3 g via INTRAVENOUS
  Filled 2020-02-12: qty 3
  Filled 2020-02-12 (×3): qty 8

## 2020-02-12 MED ORDER — IPRATROPIUM BROMIDE 0.02 % IN SOLN
0.5000 mg | Freq: Four times a day (QID) | RESPIRATORY_TRACT | Status: DC
  Filled 2020-02-12: qty 2.5

## 2020-02-12 MED ORDER — ACETAMINOPHEN 500 MG PO TABS
1000.0000 mg | ORAL_TABLET | Freq: Four times a day (QID) | ORAL | Status: DC | PRN
  Filled 2020-02-12: qty 2

## 2020-02-12 MED ORDER — PREDNISONE 20 MG PO TABS: 40.0000 mg | ORAL_TABLET | Freq: Every day | ORAL | Status: DC

## 2020-02-12 MED ORDER — UMECLIDINIUM-VILANTEROL 62.5-25 MCG/INH IN AEPB
1.0000 | INHALATION_SPRAY | Freq: Every day | RESPIRATORY_TRACT | Status: DC
  Filled 2020-02-12: qty 14

## 2020-02-12 MED ORDER — ALBUTEROL SULFATE HFA 108 (90 BASE) MCG/ACT IN AERS
2.0000 | INHALATION_SPRAY | Freq: Once | RESPIRATORY_TRACT | Status: AC
  Administered 2020-02-12: 2 via RESPIRATORY_TRACT
  Filled 2020-02-12: qty 6.7

## 2020-02-12 MED ORDER — DULOXETINE HCL 60 MG PO CPEP
60.0000 mg | ORAL_CAPSULE | Freq: Every day | ORAL | Status: DC
  Administered 2020-02-12: 60 mg via ORAL
  Filled 2020-02-12: qty 1

## 2020-02-12 MED ORDER — SODIUM CHLORIDE 0.9 % IV BOLUS: 1000.0000 mL | Freq: Once | INTRAVENOUS | Status: DC

## 2020-02-12 MED ORDER — SODIUM CHLORIDE 0.9 % IV BOLUS
500.0000 mL | Freq: Once | INTRAVENOUS | Status: AC
  Administered 2020-02-12: 500 mL via INTRAVENOUS

## 2020-02-12 MED ORDER — PANTOPRAZOLE SODIUM 40 MG PO TBEC
40.0000 mg | DELAYED_RELEASE_TABLET | Freq: Every day | ORAL | Status: DC
  Administered 2020-02-12: 40 mg via ORAL
  Filled 2020-02-12: qty 1

## 2020-02-12 MED ORDER — SODIUM CHLORIDE 0.9 % IV SOLN
1.0000 g | Freq: Once | INTRAVENOUS | Status: AC
  Administered 2020-02-12: 1 g via INTRAVENOUS
  Filled 2020-02-12: qty 10

## 2020-02-12 MED ORDER — ACETAMINOPHEN 325 MG PO TABS
650.0000 mg | ORAL_TABLET | Freq: Once | ORAL | Status: AC
  Administered 2020-02-12: 650 mg via ORAL
  Filled 2020-02-12: qty 2

## 2020-02-12 MED ORDER — SODIUM CHLORIDE 0.9 % IV SOLN
500.0000 mg | Freq: Once | INTRAVENOUS | Status: AC
  Administered 2020-02-12: 500 mg via INTRAVENOUS
  Filled 2020-02-12: qty 500

## 2020-02-12 MED ORDER — PRAVASTATIN SODIUM 40 MG PO TABS
40.0000 mg | ORAL_TABLET | Freq: Every day | ORAL | Status: DC
  Administered 2020-02-12 – 2020-02-15 (×3): 40 mg via ORAL
  Filled 2020-02-12 (×4): qty 1

## 2020-02-12 MED ORDER — IPRATROPIUM BROMIDE 0.02 % IN SOLN
0.5000 mg | RESPIRATORY_TRACT | Status: DC
  Administered 2020-02-12 (×2): 0.5 mg via RESPIRATORY_TRACT
  Filled 2020-02-12 (×2): qty 2.5

## 2020-02-12 MED ORDER — GABAPENTIN 300 MG PO CAPS
300.0000 mg | ORAL_CAPSULE | Freq: Three times a day (TID) | ORAL | Status: DC
  Administered 2020-02-12 (×2): 300 mg via ORAL
  Filled 2020-02-12 (×2): qty 1

## 2020-02-12 MED ORDER — METOPROLOL TARTRATE 25 MG PO TABS
25.0000 mg | ORAL_TABLET | Freq: Every day | ORAL | Status: DC
  Administered 2020-02-12 – 2020-02-13 (×2): 25 mg via ORAL
  Filled 2020-02-12 (×2): qty 1

## 2020-02-12 MED ORDER — DOCUSATE SODIUM 100 MG PO CAPS
100.0000 mg | ORAL_CAPSULE | Freq: Every day | ORAL | Status: DC
  Administered 2020-02-12 – 2020-02-14 (×2): 100 mg via ORAL
  Filled 2020-02-12 (×3): qty 1

## 2020-02-12 MED ORDER — IPRATROPIUM BROMIDE 0.02 % IN SOLN: 0.5000 mg | Freq: Four times a day (QID) | RESPIRATORY_TRACT | Status: DC | PRN

## 2020-02-12 MED ORDER — APIXABAN 2.5 MG PO TABS
2.5000 mg | ORAL_TABLET | Freq: Two times a day (BID) | ORAL | Status: DC
  Administered 2020-02-12 (×2): 2.5 mg via ORAL
  Filled 2020-02-12 (×3): qty 1

## 2020-02-12 MED ORDER — SODIUM CHLORIDE 0.9 % IV SOLN: INTRAVENOUS | Status: AC

## 2020-02-12 MED ORDER — DONEPEZIL HCL 10 MG PO TABS
10.0000 mg | ORAL_TABLET | Freq: Every day | ORAL | Status: DC
  Administered 2020-02-12 – 2020-02-15 (×3): 10 mg via ORAL
  Filled 2020-02-12 (×5): qty 1
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
## 2020-02-12 NOTE — Progress Notes (Signed)
Repeated VBG showed again CO2 retention, pt relatively asymptomatic breathing and mentation wise, order one ABG in AM, if continue to show CO2 retention, consider home BIPAP/Trilogy evaluation.

## 2020-02-12 NOTE — Progress Notes (Signed)
Pharmacy Antibiotic Note  Brandi Raymond is a 86 y.o. female admitted on 02/12/2020 with pneumonia.  Pharmacy has been consulted for unasyn dosing. Pt is febrile with Tmax 101.2 and WBC is WNL. SCr is WNL.   Plan: Unasyn 3gm IV Q8H F/u renal fxn, C&S, clinical status  Height: 5\' 4"  (162.6 cm) Weight: 60.3 kg (132 lb 15 oz) IBW/kg (Calculated) : 54.7  Temp (24hrs), Avg:100.2 F (37.9 C), Min:99.1 F (37.3 C), Max:101.2 F (38.4 C)  Recent Labs  Lab 02/12/20 0745 02/12/20 1030  WBC 10.2  --   CREATININE 1.14*  --   LATICACIDVEN 2.8* 1.8    Estimated Creatinine Clearance: 31.7 mL/min (A) (by C-G formula based on SCr of 1.14 mg/dL (H)).    Allergies  Allergen Reactions  . Silver Sulfadiazine Other (See Comments)    lowers white blood count     Antimicrobials this admission: Unasyn 7/29>> Azithro x 1 7/29 CTX x 1 7/29  Dose adjustments this admission: N/A  Microbiology results: Pending  Thank you for allowing pharmacy to be a part of this patient's care.  Alajia Schmelzer, Rande Lawman 02/12/2020 12:51 PM

## 2020-02-12 NOTE — ED Triage Notes (Signed)
Pt from home via ems; husband called out, found pt sitting on floor in front of bed; pt says she got out of bed because she hasn't been able to sleep, normally on 2 L; in bed when ems arrive, supine, wheezing;; 1-2 weeks ago diagnosed with pneumonia, just finished antibiotics; sats 80s on ems arrival; 10 albuterol, 1 Atrovent; 125 solue medrol given PTA; IV 22 LH; no complains of pain, no  N/V/ fever; alert and able to answer questions, but has periods of confusion; 12 lead shows L BBB, tachycardia; no travel, unknown vaccination status  168/95  HR 118 90% on neb tx T 97.61F RR 30

## 2020-02-12 NOTE — ED Notes (Signed)
Attempted report 

## 2020-02-12 NOTE — Progress Notes (Signed)
[facility] paged that patient is requesting her home medication of Clonazepam 0.5 mg that she takes daily HS. Arthor Captain LPN

## 2020-02-12 NOTE — ED Notes (Signed)
Pt speaking on phone with family

## 2020-02-13 ENCOUNTER — Inpatient Hospital Stay (HOSPITAL_COMMUNITY): Payer: PPO

## 2020-02-13 DIAGNOSIS — A419 Sepsis, unspecified organism: Principal | ICD-10-CM

## 2020-02-13 DIAGNOSIS — R6520 Severe sepsis without septic shock: Secondary | ICD-10-CM

## 2020-02-13 DIAGNOSIS — J441 Chronic obstructive pulmonary disease with (acute) exacerbation: Secondary | ICD-10-CM

## 2020-02-13 DIAGNOSIS — J9602 Acute respiratory failure with hypercapnia: Secondary | ICD-10-CM

## 2020-02-13 DIAGNOSIS — J9621 Acute and chronic respiratory failure with hypoxia: Secondary | ICD-10-CM

## 2020-02-13 DIAGNOSIS — J9622 Acute and chronic respiratory failure with hypercapnia: Secondary | ICD-10-CM

## 2020-02-13 DIAGNOSIS — J189 Pneumonia, unspecified organism: Secondary | ICD-10-CM

## 2020-02-13 LAB — CBC WITH DIFFERENTIAL/PLATELET
Abs Immature Granulocytes: 0.21 10*3/uL — ABNORMAL HIGH (ref 0.00–0.07)
Basophils Absolute: 0 10*3/uL (ref 0.0–0.1)
Basophils Relative: 0 %
Eosinophils Absolute: 0 10*3/uL (ref 0.0–0.5)
Eosinophils Relative: 0 %
HCT: 35.4 % — ABNORMAL LOW (ref 36.0–46.0)
Hemoglobin: 10 g/dL — ABNORMAL LOW (ref 12.0–15.0)
Immature Granulocytes: 1 %
Lymphocytes Relative: 4 %
Lymphs Abs: 0.8 10*3/uL (ref 0.7–4.0)
MCH: 26.1 pg (ref 26.0–34.0)
MCHC: 28.2 g/dL — ABNORMAL LOW (ref 30.0–36.0)
MCV: 92.4 fL (ref 80.0–100.0)
Monocytes Absolute: 1.4 10*3/uL — ABNORMAL HIGH (ref 0.1–1.0)
Monocytes Relative: 6 %
Neutro Abs: 19.1 10*3/uL — ABNORMAL HIGH (ref 1.7–7.7)
Neutrophils Relative %: 89 %
Platelets: 164 10*3/uL (ref 150–400)
RBC: 3.83 MIL/uL — ABNORMAL LOW (ref 3.87–5.11)
RDW: 14.8 % (ref 11.5–15.5)
WBC: 21.6 10*3/uL — ABNORMAL HIGH (ref 4.0–10.5)
nRBC: 0 % (ref 0.0–0.2)

## 2020-02-13 LAB — APTT: aPTT: 39 seconds — ABNORMAL HIGH (ref 24–36)

## 2020-02-13 LAB — COMPREHENSIVE METABOLIC PANEL
ALT: 17 U/L (ref 0–44)
AST: 30 U/L (ref 15–41)
Albumin: 3.1 g/dL — ABNORMAL LOW (ref 3.5–5.0)
Alkaline Phosphatase: 58 U/L (ref 38–126)
Anion gap: 8 (ref 5–15)
BUN: 18 mg/dL (ref 8–23)
CO2: 29 mmol/L (ref 22–32)
Calcium: 7.8 mg/dL — ABNORMAL LOW (ref 8.9–10.3)
Chloride: 105 mmol/L (ref 98–111)
Creatinine, Ser: 0.97 mg/dL (ref 0.44–1.00)
GFR calc Af Amer: >60 mL/min (ref >60)
GFR calc non Af Amer: 54 mL/min — ABNORMAL LOW (ref >60)
Glucose, Bld: 157 mg/dL — ABNORMAL HIGH (ref 70–99)
Potassium: 5.1 mmol/L (ref 3.5–5.1)
Sodium: 142 mmol/L (ref 135–145)
Total Bilirubin: 0.4 mg/dL (ref 0.3–1.2)
Total Protein: 6.4 g/dL — ABNORMAL LOW (ref 6.5–8.1)

## 2020-02-13 LAB — OCCULT BLOOD GASTRIC / DUODENUM (SPECIMEN CUP): Occult Blood, Gastric: POSITIVE — AB

## 2020-02-13 LAB — BLOOD GAS, ARTERIAL
Acid-Base Excess: 3.9 mmol/L — ABNORMAL HIGH (ref 0.0–2.0)
Bicarbonate: 30.7 mmol/L — ABNORMAL HIGH (ref 20.0–28.0)
Drawn by: 91133
FIO2: 80
O2 Saturation: 95.6 %
Patient temperature: 36.8
pCO2 arterial: 73.4 mmHg (ref 32.0–48.0)
pH, Arterial: 7.244 — ABNORMAL LOW (ref 7.350–7.450)
pO2, Arterial: 84.9 mmHg (ref 83.0–108.0)

## 2020-02-13 LAB — MRSA PCR SCREENING: MRSA by PCR: NEGATIVE

## 2020-02-13 LAB — PROTIME-INR
INR: 1.2 (ref 0.8–1.2)
Prothrombin Time: 14.4 seconds (ref 11.4–15.2)

## 2020-02-13 LAB — LACTIC ACID, PLASMA: Lactic Acid, Venous: 1 mmol/L (ref 0.5–1.9)

## 2020-02-13 LAB — LIPASE, BLOOD: Lipase: 20 U/L (ref 11–51)

## 2020-02-13 MED ORDER — IPRATROPIUM-ALBUTEROL 0.5-2.5 (3) MG/3ML IN SOLN
3.0000 mL | RESPIRATORY_TRACT | Status: DC
  Administered 2020-02-13: 3 mL via RESPIRATORY_TRACT
  Filled 2020-02-13: qty 3

## 2020-02-13 MED ORDER — ARFORMOTEROL TARTRATE 15 MCG/2ML IN NEBU
15.0000 ug | INHALATION_SOLUTION | Freq: Two times a day (BID) | RESPIRATORY_TRACT | Status: DC
  Administered 2020-02-13 – 2020-02-16 (×7): 15 ug via RESPIRATORY_TRACT
  Filled 2020-02-13 (×7): qty 2

## 2020-02-13 MED ORDER — CHLORHEXIDINE GLUCONATE CLOTH 2 % EX PADS
6.0000 | MEDICATED_PAD | Freq: Every day | CUTANEOUS | Status: DC
  Administered 2020-02-13 – 2020-02-16 (×3): 6 via TOPICAL

## 2020-02-13 MED ORDER — VANCOMYCIN HCL 1250 MG/250ML IV SOLN
1250.0000 mg | Freq: Once | INTRAVENOUS | Status: AC
  Administered 2020-02-13: 1250 mg via INTRAVENOUS
  Filled 2020-02-13: qty 250

## 2020-02-13 MED ORDER — VANCOMYCIN HCL IN DEXTROSE 1-5 GM/200ML-% IV SOLN
1000.0000 mg | INTRAVENOUS | Status: DC
  Administered 2020-02-15: 1000 mg via INTRAVENOUS
  Filled 2020-02-13: qty 200

## 2020-02-13 MED ORDER — IPRATROPIUM-ALBUTEROL 0.5-2.5 (3) MG/3ML IN SOLN
3.0000 mL | RESPIRATORY_TRACT | Status: DC | PRN
  Administered 2020-02-14: 3 mL via RESPIRATORY_TRACT
  Filled 2020-02-13: qty 3

## 2020-02-13 MED ORDER — SODIUM CHLORIDE 0.9 % IV SOLN
500.0000 mg | INTRAVENOUS | Status: DC
  Administered 2020-02-13 – 2020-02-16 (×4): 500 mg via INTRAVENOUS
  Filled 2020-02-13 (×4): qty 500

## 2020-02-13 MED ORDER — SODIUM CHLORIDE 0.9 % IV SOLN
2.0000 g | INTRAVENOUS | Status: DC
  Filled 2020-02-13: qty 20

## 2020-02-13 MED ORDER — DEXTROSE-NACL 5-0.45 % IV SOLN: INTRAVENOUS | Status: DC

## 2020-02-13 MED ORDER — SODIUM CHLORIDE 0.9 % IV SOLN
2.0000 g | Freq: Two times a day (BID) | INTRAVENOUS | Status: DC
  Administered 2020-02-13 – 2020-02-15 (×6): 2 g via INTRAVENOUS
  Filled 2020-02-13 (×7): qty 2

## 2020-02-13 MED ORDER — METHYLPREDNISOLONE SODIUM SUCC 125 MG IJ SOLR
60.0000 mg | Freq: Two times a day (BID) | INTRAMUSCULAR | Status: DC
  Administered 2020-02-13 – 2020-02-14 (×4): 60 mg via INTRAVENOUS
  Filled 2020-02-13 (×4): qty 2

## 2020-02-13 MED ORDER — PANTOPRAZOLE SODIUM 40 MG IV SOLR
40.0000 mg | Freq: Two times a day (BID) | INTRAVENOUS | Status: DC
  Administered 2020-02-13 – 2020-02-16 (×8): 40 mg via INTRAVENOUS
  Filled 2020-02-13 (×8): qty 40

## 2020-02-13 MED ORDER — ONDANSETRON HCL 4 MG/2ML IJ SOLN
4.0000 mg | Freq: Four times a day (QID) | INTRAMUSCULAR | Status: DC | PRN
  Administered 2020-02-13: 4 mg via INTRAVENOUS
  Filled 2020-02-13: qty 2

## 2020-02-13 MED ORDER — FUROSEMIDE 10 MG/ML IJ SOLN: 20.0000 mg | Freq: Two times a day (BID) | INTRAMUSCULAR | Status: DC

## 2020-02-13 MED ORDER — FUROSEMIDE 10 MG/ML IJ SOLN
20.0000 mg | Freq: Once | INTRAMUSCULAR | Status: AC
  Administered 2020-02-13: 20 mg via INTRAVENOUS
  Filled 2020-02-13: qty 2

## 2020-02-13 MED ORDER — FUROSEMIDE 10 MG/ML IJ SOLN
40.0000 mg | Freq: Once | INTRAMUSCULAR | Status: AC
  Administered 2020-02-13: 40 mg via INTRAVENOUS
  Filled 2020-02-13: qty 4

## 2020-02-13 MED ORDER — IPRATROPIUM-ALBUTEROL 0.5-2.5 (3) MG/3ML IN SOLN
3.0000 mL | Freq: Four times a day (QID) | RESPIRATORY_TRACT | Status: DC
  Administered 2020-02-13: 3 mL via RESPIRATORY_TRACT
  Filled 2020-02-13: qty 3

## 2020-02-13 NOTE — Significant Event (Addendum)
HOSPITAL MEDICINE OVERNIGHT EVENT NOTE  Initially called by nursing due to patient's initial request for Clonazepam in the late evening.  Upon reviewing the chart I noted hypercapnic respiratory failure on initial VBG and therefore advised that an ABG be performed prior to considering restarting home Clonazepam.  ABG alarmingly revealed: pH 7.17 pCO2 83.2 pO2 71.2  I went to evaluate the patient at the bedside and found patient to be tachepnic with "gurgling" that could be audibly heard from the hallway.  Patient was lethargic but arousable.  Patient was oriented x 3.   Patient exhibited visible accessory muscle use.  Lung sounds were coarse with expiratory wheezing.    Shortly after walking out of the room the patient began to cough up what appeared to be coffee ground emesis/phlegm.  Gastric occult testing was performed on this and was found to be positive.   Based on the above ABG typically BiPAP would immediately be recommended if the patient could tolerate it however due to concerns about airway secretions and vomiting after a discussion with respiratory we opted for high flow oxygen delivery.  Nebulized bronchodilators were provided.  Considering the patient's clinical deterioration and severity of illness I transitioned patients admission Abx regimen of Unasyn monotherapy to Azithromycin, Ceftriaxone and Vancomycin.  I switched the patient from oral Prednisone to IV Solumedrol 60mg  Q12hrs.  I have asked respiratory therapy to provide aggressive physiotherapy with suctioning to help continue to mobile secretions. I have placed the patient on Protonix Q12hrs, GI can be consulted during the day.   I am concerned that the patient is at high risk of needing intubation and ICU care in the near future.  An ABG will need to be repeated in several hours.  I confirmed with the patient that she is DNR.  I tried to call her family to update them on her plan of care but there was no answer.    I have  discussed the case with Dr. Lucile Shutters with CCM who has graciously agreed to have his team come and consult on the patient.  I appreciate their recommendations.  Brandi Raymond  ADDENDUM [facility])  Discussed case with CCM after they came to evaluate the patient.  They agree with high flow for now and agree with aversion to BiPAP although if she worsens we may need to start BiPAP anyway.   They recommend an antipseudomonal antibiotic so I have switched Ceftiraxone to Cefipime.  They recommend continued systemic steroids, continued IV diuresis and bronchodilator therapy.  Per their discussions with the patient she has voiced she does not wish to be intubated.  Will monitor closely, obtain serial VBG per their recommendations.  Eliquis stopped.  Will need to monitor H/H, involve GI in AM.  Brandi Raymond   ADDENDUM 5:30am  Repeat ABG revealing slowly improving pH and CO2.  Continuing to montior closely.  Weaning supplemental O2 to target SpO2 89-92%. Family has not called back yet and did not answer when both CCM and myself tried calling.   Brandi Raymond

## 2020-02-13 NOTE — Progress Notes (Signed)
[facility]  Critical Value:  PH 7.176,PCo2 83.2  Date & Time Notied:  0052 7/30  Provider Notified:[facility]  Orders Received/Actions taken: awaiting for new orders. Rapid Response assessed patient.

## 2020-02-13 NOTE — Evaluation (Signed)
Physical Therapy Evaluation Patient Details Name: Brandi Raymond MRN: [id number] DOB: Apr 19, 1934 Today's Date: 02/13/2020   History of Present Illness  86yo female found on the floor by family member. Confused and hypoxic in the ED. Recent admission 1 month ago for PNA. Daughter reports frequent coughing and choking on food as well. Now admitted with acute on chronic respiratory failure. PMH CAD, hx cervical fracture, COPD, hx R hip fracture, HLD, HTN, lung CA s/p XRT, macular degeneration, MI, ocular myasthenia gravis, orthostatic hypotension, syncope, cardiac cath, pacemaker  Clinical Impression   Patient received in bed, eager to mobilize and get OOB today. See below for mobility/assist levels- overall needs Min-ModA to mobilize and very impulsive with poor safety awareness and poor insight into deficits during this session. Unable to get accurate wall monitor or pulse ox reading throughout session however once up in recliner finally able to get reading at 95% SPO2 and HR 107BPM while on 20LPM. Very unsteady on her feet and easily fatigued. Family reports that they feel overwhelmed trying to care for her at home at this point. She was left up in recliner with all needs met, chair alarm active and family present. Strongly recommend SNF and 24/7 care.     Follow Up Recommendations SNF;Supervision/Assistance - 24 hour    Equipment Recommendations  Rolling walker with 5" wheels;3in1 (PT)    Recommendations for Other Services       Precautions / Restrictions Precautions Precautions: ICD/Pacemaker;Fall;Other (comment) Precaution Comments: watch O2/HR, impulsive Restrictions Weight Bearing Restrictions: No      Mobility  Bed Mobility Overal bed mobility: Needs Assistance Bed Mobility: Supine to Sit     Supine to sit: Supervision     General bed mobility comments: S for safety and management of lines/cords  Transfers Overall transfer level: Needs assistance Equipment used:  Rolling walker (2 wheeled) Transfers: Sit to/from [facility] Sit to Stand: Min assist;Mod assist Stand pivot transfers: Mod assist       General transfer comment: fluctuates between Min-ModA with all transfers- sometimes pops right up, other times needs more of a boost but consistently needed ModA to pivot with RW to maintain safety/proximity from device and to maintain integrity of lines  Ambulation/Gait             General Gait Details: deferred- on 20LPM O2 and unable to disconnect from wall supply  Stairs            Wheelchair Mobility    Modified Rankin (Stroke Patients Only)       Balance Overall balance assessment: Needs assistance;History of Falls Sitting-balance support: Bilateral upper extremity supported;Feet supported Sitting balance-Leahy Scale: Good Sitting balance - Comments: able to reach down to floor while sitting in recliner   Standing balance support: Bilateral upper extremity supported;During functional activity Standing balance-Leahy Scale: Poor Standing balance comment: Min-ModA to maintain balance due to impulsivity and poor safety awareness                             Pertinent Vitals/Pain Pain Assessment: No/denies pain    Home Living Family/patient expects to be discharged to:: Private residence Living Arrangements: Spouse/significant other (spouse has a back issue but can help; both daughters are local and can come by to help PRN) Available Help at Discharge: Family;Available 24 hours/day Type of Home: House Home Access: Stairs to enter Entrance Stairs-Rails: Right Entrance Stairs-Number of Steps: 3 Home Layout: One level Home Equipment: Gilford Rile -  4 wheels;Bedside commode Additional Comments: uses 3 in 1 as shower seat; patient states she may have lent her walker to someone    Prior Function Level of Independence: Independent with assistive device(s)         Comments: has a rollator but doesn't  really use it. walks around house without any device. Has home O2 and won't use RW bc her excuse is she gets too tangled in the O2 cord. Usually on 2LPM.     Hand Dominance   Dominant Hand: Right    Extremity/Trunk Assessment   Upper Extremity Assessment Upper Extremity Assessment: Defer to OT evaluation    Lower Extremity Assessment Lower Extremity Assessment: Generalized weakness    Cervical / Trunk Assessment Cervical / Trunk Assessment: Kyphotic  Communication   Communication: No difficulties  Cognition Arousal/Alertness: Awake/alert Behavior During Therapy: WFL for tasks assessed/performed Overall Cognitive Status: History of cognitive impairments - at baseline                                 General Comments: hx of dementia at baseline- at first seems functional and A&Ox4 but with functional and applied tasks, she often perseverates on eating/drinking (currently NPO) and on going for a walk despite being told mulitple times we are chained to the wall due to being on 20LPM of oxygen      General Comments General comments (skin integrity, edema, etc.): unable to get accurate signal on wall monitor or pulse ox for majority of session, finally able to get reading at SpO2 95% HR 107BPM up in recliner on HFNC 20LPM rate    Exercises     Assessment/Plan    PT Assessment Patient needs continued PT services  PT Problem List Decreased strength;Decreased activity tolerance;Decreased safety awareness;Decreased balance;Decreased mobility;Decreased coordination       PT Treatment Interventions DME instruction;Balance training;Gait training;Neuromuscular re-education;Stair training;Functional mobility training;Patient/family education;Therapeutic activities;Therapeutic exercise    PT Goals (Current goals can be found in the Care Plan section)  Acute Rehab PT Goals Patient Stated Goal: rehab PT Goal Formulation: With family Time For Goal Achievement:  02/27/20 Potential to Achieve Goals: Fair    Frequency Min 2X/week   Barriers to discharge        Co-evaluation               AM-PAC PT "6 Clicks" Mobility  Outcome Measure Help needed turning from your back to your side while in a flat bed without using bedrails?: A Little Help needed moving from lying on your back to sitting on the side of a flat bed without using bedrails?: A Little Help needed moving to and from a bed to a chair (including a wheelchair)?: A Lot Help needed standing up from a chair using your arms (e.g., wheelchair or bedside chair)?: A Little Help needed to walk in hospital room?: A Lot Help needed climbing 3-5 steps with a railing? : Total 6 Click Score: 14    End of Session Equipment Utilized During Treatment: Gait belt;Oxygen Activity Tolerance: Patient tolerated treatment well Patient left: in chair;with call bell/phone within reach;with chair alarm set;with family/visitor present Nurse Communication: Mobility status PT Visit Diagnosis: Unsteadiness on feet (R26.81);Difficulty in walking, not elsewhere classified (R26.2);History of falling (Z91.81);Muscle weakness (generalized) (M62.81)    Time: 8938-1017 PT Time Calculation (min) (ACUTE ONLY): 43 min   Charges:   PT Evaluation $PT Eval High Complexity: 1 High PT Treatments $Therapeutic Activity:  8-22 mins $Self Care/Home Management: 8-22        Windell Norfolk, DPT, PN1   Supplemental Physical Therapist [place]    Pager [phone] Acute Rehab Office [phone]

## 2020-02-13 NOTE — Progress Notes (Signed)
  Speech Language Pathology Treatment: Dysphagia  Patient Details Name: Brandi Raymond MRN: [id number] DOB: 30-Jul-1933 Today's Date: 02/13/2020 Time: 9150-5697 SLP Time Calculation (min) (ACUTE ONLY): 12 min  Assessment / Plan / Recommendation Clinical Impression  Pt reassessed at lower O2 requirements.  Pt seated upright in chair with HHFNC at 20L/min and 35% FiO2.  With cup sips of thin liquid there was delayed wet cough.  Pt has hx GERD and is currently on medication.  Family reports coughing with intake at home.  Pt tolerated small amounts of water by spoon without overt s/s of aspiration.  With puree and solid textures, pt achieved adequate oral clearance given additional time for mastication.  There was delayed cough x1 with puree, which did not sound as wet as with water.  There was no coughing noted with regular solid.  Given pt's clinical presentation and hx of recurrent pneumonia, recommend instrumental swallow evaluation prior to initiation of PO diet.  Pt is unable to transport to radiology for MBSS because of heated high flow [place].  Spoke with pulmonary NP.  Goal is to wean pt to regular HFNC with perhaps lower O2 requirements and plan for MBSS next date.  Recommend pt remain NPO with alternate means of nutrition, hydration, and medication.  Pt may have small amounts of water by teaspoon only for comfort after good oral care, in moderation, when pt is fully awake/alert, with upright positioning and supervision.     HPI HPI: Brandi Raymond is an 86 y.o. 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  She was found on floor by family member.  Patient relatively confused in the ED, most of the history provided by patient daughter over the phone.  Daughter reported patient was admitted about 30 days ago for PNA, and patient  was discharged with antibiotics and steroids.  Seems like the patient remained weak and occasional cough which is not her baseline.  Daughter also reported the patient occasionally has choking after eating her food.  Denies fever, rigors.  Patient further denies any chest pain or abdominal pain, no diarrhea no dysuria.  She was found to have PNA, acute on chronic hypoxic respiratory failure, deconditioning, dehydration with elevated creatinine and lactate, and acute COPD exacerbation.  Overnight she had an event where her O2 levels began to drop requiring placement of HHFNC.   Initial settings were an FiO2 of 70% at 55L/min.   Most recent chest xray was showing increased right basilar infiltrate when compared to the previous exam.        SLP Plan  MBS       Recommendations  Diet recommendations: NPO Liquids provided via: Teaspoon Medication Administration: Via alternative means                Oral Care Recommendations: Oral care QID Follow up Recommendations:  (TBD) SLP Visit Diagnosis: Dysphagia, unspecified (R13.10) Plan: MBS       GO                Madhav Mohon E Kemauri Musa 02/13/2020, 12:23 PM

## 2020-02-13 NOTE — Progress Notes (Signed)
PCCM Progress Note  Patient is a 86yo female who presented with sepsis physiology on 7/29 in the setting of pneumonia wiith associated COPD exacerbation. Overnight patient developed worsening respiratory distress with increased work of breathing and worsening hypercapnia. At this time PCCM was consulted and patient was seen by our night team, please see full consult note for further details,   Patient was reassessed this morning and appears comfortable on HFNC. She remains a DNR. Will continue current treatment plan of high flow supplemental oxygen, steroids, bronchodilators, and brad spectrum antibiotics.   PCCM will continue to follow   [name], NP-C [facility] Contact / Pager information can be found on Amion  02/13/2020, 10:22 AM

## 2020-02-13 NOTE — Progress Notes (Signed)
Holding off on placing pt on bipap due to improved ABG, RN/Rapid aware of this. Pt currently on heated HFNC 80% 55L.

## 2020-02-13 NOTE — Plan of Care (Signed)
  Problem: Education: Goal: Knowledge of General Education information will improve Description: Including pain rating scale, medication(s)/side effects and non-pharmacologic comfort measures Outcome: Progressing   Problem: Health Behavior/Discharge Planning: Goal: Ability to manage health-related needs will improve Outcome: Progressing   Problem: Clinical Measurements: Goal: Respiratory complications will improve Outcome: Progressing   Problem: Safety: Goal: Ability to remain free from injury will improve Outcome: Progressing

## 2020-02-13 NOTE — Significant Event (Addendum)
Rapid Response Event Note  Overview: Respiratory distress  Initial Focused Assessment: I was notified by nursing staff while rounding of pts tachypnea and ABG results (7.17/83/71/30). Upon arrival, pt is arousable, oriented to self, month and situation. RR 30 with abdominal accessory muscle use. Sats 88-90% on [place] at 15L. BBS Coarse rhonchi. Pt has brown emesis/expectorate. Pt is currently DNR. I would not recommend BIPAP at this time due to secretions/emesis. I directed primary RN to notify primary service for [place] discussion. Dr. Cyd Silence to see pt.   Interventions: -No acute interventions at this time  Plan of Care (if not transferred): -GOC discussion -High risk for intubation if pt wants supportive care  Event Summary: Call received [phone] Arrived at call 0100 Call ended [phone]    Addendum: 0335-At bedside with PCCM. Witnessed conversation with pt confirming DNR status and no intubation. Pt is more lethargic and more labored in her effort. Still arousable and able to answer questions. MD attempted contact with family and no answer. Repeat ABG after placing on HHFNC at 55L and 100%. Bipap discussed as a possible option if ABG worse.   Madelynn Done

## 2020-02-13 NOTE — Progress Notes (Signed)
Pharmacy Antibiotic Note  Brandi Raymond is a 86 y.o. female admitted on 02/12/2020 with SOB/PNA.  Pharmacy has been consulted for Vancomycin dosing.  Plan: Vancomycin 1250 mg IV now, then 1000 mg IV q48h  Height: 5\' 4"  (162.6 cm) Weight: 61.8 kg (136 lb 3.2 oz) IBW/kg (Calculated) : 54.7  Temp (24hrs), Avg:99.6 F (37.6 C), Min:98.4 F (36.9 C), Max:101.2 F (38.4 C)  Recent Labs  Lab 02/12/20 0745 02/12/20 1030  WBC 10.2  --   CREATININE 1.14*  --   LATICACIDVEN 2.8* 1.8    Estimated Creatinine Clearance: 31.7 mL/min (A) (by C-G formula based on SCr of 1.14 mg/dL (H)).    Allergies  Allergen Reactions  . Silver Sulfadiazine Other (See Comments)    lowers white blood count      Caryl Pina 02/13/2020 2:41 AM

## 2020-02-13 NOTE — Progress Notes (Signed)
Changed from heated high flow to 7L [place] patient tolerated well, SATS 100%
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
## 2020-02-13 NOTE — Progress Notes (Signed)
PROGRESS NOTE  Brandi Raymond  DOB: 21-Oct-1933  PCP: Binnie Rail, MD GHW:[id number]  DOA: 02/12/2020  LOS: 1 day   Chief Complaint  Patient presents with  . Shortness of Breath   Brief narrative: Brandi Raymond is a 86 y.o. 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  Patient was brought to the ED on 7/29 after she was found on the floor by family member.   In the ED, patient was confused, had fever of 101.2, tachycardic, tachypneic, hypoxic and initially required oxygen via nonrebreather to maintain saturation over 90%. Initial blood gas showed a pH of 7.26, PCO2 elevated to 71,  WBC 10.2., lactic acid elevated 2.8 > 1.8 after IV hydration Chest x-ray right lower lobe scarring versus infiltrates.  She was admitted to hospitalist service for further evaluation management.  Last night, blood gas was repeated which showed worsening PCO2 level to 83 with pH low at 7.17. Patient was lethargic.  She also had an episode of coffee-ground emesis, guaiac positive. BiPAP could not be applied because of altered mentation and vomiting. DNR status was confirmed.  Patient was placed on high flow oxygen by nasal cannula. PCCM consulted.  Antibiotics were upgraded.  Subjective: Patient was seen and examined this morning.  Elderly female.  Propped up in bed.  Not in distress.   Remains on 20 L/min of high flow oxygen by nasal cannula. Chart reviewed. Repeat blood gas this morning shows slight improvement PCO2 to 73, pH 7.24 WBC count up at 22,000.  Assessment/Plan: Acute on chronic hypoxic and hypercapnic respiratory failure Acute exacerbation of COPD Right lower lobe pneumonia -likely aspiration pneumonia -Presented with altered mental status, shortness of breath. -Serial blood gas reports as above showing worsening CO2. -Unable to  use BiPAP because of altered mentation and vomiting -Currently on 20L/min of high flow oxygen by nasal cannula. -Repeat blood gas this morning shows an improvement. -Currently on IV cefepime, IV azithromycin, and IV vancomycin. -On IV Solu-Medrol 60 mg twice daily, DuoNebs -PCCM consultation appreciated.  Acute metabolic encephalopathy -Likely secondary to hypercapnia.  Also contributed by poor oral intake, dehydration -Continue to monitor mental status changes.  Generalized weakness, impaired mobility -PT evaluation.  Elevated creatinine/elevated lactic acid -Secondary to dehydration, anaerobic respiration -Both have improved this morning. -At home, patient was supposed to be Lasix 40 mg every other day. -On admission, patient was started on IV fluid. -To help breathing, I food stop IV fluid and give her a dose of IV Lasix 40 mg this morning.  Cardiovascular issues: HTN, CAD, chronic A. Fib -Home meds include metoprolol tartrate 25 mg daily, Eliquis 2.5 mg twice daily, pravastatin 40 mg daily -Resume all. -Continue to monitor blood pressure, heart rate and cardiac symptoms  Advanced dementia/anxiety -Home meds include Aricept, Klonopin, Cymbalta, Neurontin -I would resume them once mental status improves.  Mobility: PT evaluation Code Status:   Code Status: DNR  Nutritional status: Body mass index is 23.38 kg/m.     Diet Order            Diet NPO time specified  Diet effective now                 DVT prophylaxis: Place and maintain sequential compression device Start: 02/13/20 0457   Antimicrobials:  IV cefepime, IV azithromycin, IV vancomycin Fluid: No IV fluid  Consultants:  PCCM Family Communication:  None at bedside  Status is: Inpatient  Remains inpatient appropriate because:Hemodynamically unstable, Ongoing diagnostic testing needed not appropriate for outpatient work up and IV treatments appropriate due to intensity of illness or inability to take  PO   Dispo: The patient is from: Home              Anticipated d/c is to: SNF              Anticipated d/c date is: 3 days              Patient currently is not medically stable to d/c.       Infusions:  . azithromycin    . ceFEPime (MAXIPIME) IV    . [START ON 02/15/2020] vancomycin      Scheduled Meds: . arformoterol  15 mcg Nebulization BID  . Chlorhexidine Gluconate Cloth  6 each Topical Daily  . docusate sodium  100 mg Oral QHS  . donepezil  10 mg Oral QHS  . DULoxetine  60 mg Oral Daily  . furosemide  20 mg Intravenous BID  . gabapentin  300 mg Oral TID  . ipratropium-albuterol  3 mL Nebulization Q4H  . methylPREDNISolone (SOLU-MEDROL) injection  60 mg Intravenous Q12H  . metoprolol tartrate  25 mg Oral Daily  . pantoprazole (PROTONIX) IV  40 mg Intravenous Q12H  . pravastatin  40 mg Oral QHS    Antimicrobials: Anti-infectives (From admission, onward)   Start     Dose/Rate Route Frequency Ordered Stop   02/15/20 0600  vancomycin (VANCOCIN) IVPB 1000 mg/200 mL premix     Discontinue     1,000 mg 200 mL/hr over 60 Minutes Intravenous Every 48 hours 02/13/20 0243     02/13/20 0800  cefTRIAXone (ROCEPHIN) 2 g in sodium chloride 0.9 % 100 mL IVPB  Status:  Discontinued        2 g 200 mL/hr over 30 Minutes Intravenous Every 24 hours 02/13/20 0229 02/13/20 0457   02/13/20 0800  azithromycin (ZITHROMAX) 500 mg in sodium chloride 0.9 % 250 mL IVPB     Discontinue     500 mg 250 mL/hr over 60 Minutes Intravenous Every 24 hours 02/13/20 0229 02/18/20 0759   02/13/20 0600  ceFEPIme (MAXIPIME) 2 g in sodium chloride 0.9 % 100 mL IVPB     Discontinue     2 g 200 mL/hr over 30 Minutes Intravenous 2 times daily 02/13/20 0503     02/13/20 0315  vancomycin (VANCOREADY) IVPB 1250 mg/250 mL        1,250 mg 166.7 mL/hr over 90 Minutes Intravenous  Once 02/13/20 0243 02/13/20 0531   02/12/20 1330  Ampicillin-Sulbactam (UNASYN) 3 g in sodium chloride 0.9 % 100 mL IVPB  Status:   Discontinued        3 g 200 mL/hr over 30 Minutes Intravenous Every 8 hours 02/12/20 1250 02/13/20 0232   02/12/20 0830  cefTRIAXone (ROCEPHIN) 1 g in sodium chloride 0.9 % 100 mL IVPB        1 g 200 mL/hr over 30 Minutes Intravenous  Once 02/12/20 0816 02/12/20 0925   02/12/20 0830  azithromycin (ZITHROMAX) 500 mg in sodium chloride 0.9 % 250 mL IVPB        500 mg 250 mL/hr over 60 Minutes Intravenous  Once 02/12/20 0816 02/12/20 0957      PRN meds: acetaminophen, ipratropium-albuterol, ondansetron (ZOFRAN) IV   Objective: Vitals:   02/13/20 0501 02/13/20 0740  BP: Marland Kitchen)  153/66 (!) 157/93  Pulse: 95 105  Resp: (!) 28 14  Temp: 98.3 F (36.8 C) 97.7 F (36.5 C)  SpO2: 95% 100%    Intake/Output Summary (Last 24 hours) at 02/13/2020 0810 Last data filed at 02/13/2020 0756 Gross per 24 hour  Intake 1204.15 ml  Output 1475 ml  Net -270.85 ml   Filed Weights   02/12/20 0746 02/12/20 1441  Weight: 60.3 kg 61.8 kg   Weight change:  Body mass index is 23.38 kg/m.   Physical Exam: General exam: Appears calm and comfortable.  Not in physical distress Skin: No rashes, lesions or ulcers. HEENT: Atraumatic, normocephalic, supple neck, no obvious bleeding Lungs: Diminished air entry in both bases, mild crackles in the right CVS: Regular rate and rhythm, no murmur GI/Abd soft, nontender, nondistended CNS: Alert, awake, oriented to place Psychiatry: Mood appropriate Extremities: No pedal edema, no calf tenderness  Data Review: I have personally reviewed the laboratory data and studies available.  Recent Labs  Lab 02/12/20 0745 02/12/20 0816 02/13/20 0221  WBC 10.2  --  21.6*  NEUTROABS 7.4  --  19.1*  HGB 10.9* 12.2 10.0*  HCT 39.2 36.0 35.4*  MCV 93.1  --  92.4  PLT 154  --  164   Recent Labs  Lab 02/12/20 0745 02/12/20 0816 02/13/20 0221  NA 143 142 142  K 4.1 4.1 5.1  CL 102  --  105  CO2 29  --  29  GLUCOSE 168*  --  157*  BUN 18  --  18  CREATININE 1.14*   --  0.97  CALCIUM 8.4*  --  7.8*   Lab Results  Component Value Date   HGBA1C 6.0 (H) 01/11/2020       Component Value Date/Time   CHOL 200 12/13/2017 1109   TRIG 120.0 12/13/2017 1109   HDL 92.70 12/13/2017 1109   CHOLHDL 2 12/13/2017 1109   VLDL 24.0 12/13/2017 1109   LDLCALC 83 12/13/2017 1109   LDLDIRECT 98.7 08/13/2007 0945    Signed, Terrilee Croak, MD [facility] Pager: [phone] (Secure Chat preferred). 02/13/2020
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
## 2020-02-13 NOTE — Progress Notes (Signed)
Patient unable to tolerate chest vest, patient asked for a flutter valve since she is familiar with it and uses at home, patient demonstrated good technique with the flutter valve, coughed and cleared her own secretions.

## 2020-02-14 ENCOUNTER — Inpatient Hospital Stay (HOSPITAL_COMMUNITY): Payer: PPO

## 2020-02-14 LAB — CBC WITH DIFFERENTIAL/PLATELET
Abs Immature Granulocytes: 0.08 10*3/uL — ABNORMAL HIGH (ref 0.00–0.07)
Basophils Absolute: 0 10*3/uL (ref 0.0–0.1)
Basophils Relative: 0 %
Eosinophils Absolute: 0 10*3/uL (ref 0.0–0.5)
Eosinophils Relative: 0 %
HCT: 32.7 % — ABNORMAL LOW (ref 36.0–46.0)
Hemoglobin: 9.3 g/dL — ABNORMAL LOW (ref 12.0–15.0)
Immature Granulocytes: 1 %
Lymphocytes Relative: 4 %
Lymphs Abs: 0.6 10*3/uL — ABNORMAL LOW (ref 0.7–4.0)
MCH: 26 pg (ref 26.0–34.0)
MCHC: 28.4 g/dL — ABNORMAL LOW (ref 30.0–36.0)
MCV: 91.3 fL (ref 80.0–100.0)
Monocytes Absolute: 0.5 10*3/uL (ref 0.1–1.0)
Monocytes Relative: 4 %
Neutro Abs: 11.7 10*3/uL — ABNORMAL HIGH (ref 1.7–7.7)
Neutrophils Relative %: 91 %
Platelets: 124 10*3/uL — ABNORMAL LOW (ref 150–400)
RBC: 3.58 MIL/uL — ABNORMAL LOW (ref 3.87–5.11)
RDW: 14.7 % (ref 11.5–15.5)
WBC: 12.8 10*3/uL — ABNORMAL HIGH (ref 4.0–10.5)
nRBC: 0 % (ref 0.0–0.2)

## 2020-02-14 LAB — BASIC METABOLIC PANEL
Anion gap: 9 (ref 5–15)
BUN: 26 mg/dL — ABNORMAL HIGH (ref 8–23)
CO2: 32 mmol/L (ref 22–32)
Calcium: 7.7 mg/dL — ABNORMAL LOW (ref 8.9–10.3)
Chloride: 103 mmol/L (ref 98–111)
Creatinine, Ser: 0.91 mg/dL (ref 0.44–1.00)
GFR calc Af Amer: >60 mL/min (ref >60)
GFR calc non Af Amer: 58 mL/min — ABNORMAL LOW (ref >60)
Glucose, Bld: 127 mg/dL — ABNORMAL HIGH (ref 70–99)
Potassium: 4.5 mmol/L (ref 3.5–5.1)
Sodium: 144 mmol/L (ref 135–145)

## 2020-02-14 LAB — BLOOD GAS, ARTERIAL
Acid-Base Excess: 1.7 mmol/L (ref 0.0–2.0)
Bicarbonate: 29.5 mmol/L — ABNORMAL HIGH (ref 20.0–28.0)
Drawn by: 51133
FIO2: 36
O2 Saturation: 90.9 %
Patient temperature: 37
pCO2 arterial: 83.2 mmHg (ref 32.0–48.0)
pH, Arterial: 7.176 — CL (ref 7.350–7.450)
pO2, Arterial: 71.2 mmHg — ABNORMAL LOW (ref 83.0–108.0)

## 2020-02-14 LAB — PHOSPHORUS: Phosphorus: 3 mg/dL (ref 2.5–4.6)

## 2020-02-14 LAB — MAGNESIUM: Magnesium: 2.1 mg/dL (ref 1.7–2.4)

## 2020-02-14 MED ORDER — FUROSEMIDE 10 MG/ML IJ SOLN
INTRAMUSCULAR | Status: AC
  Filled 2020-02-14: qty 4

## 2020-02-14 MED ORDER — CLONAZEPAM 0.5 MG PO TABS
0.5000 mg | ORAL_TABLET | Freq: Every day | ORAL | Status: DC
  Administered 2020-02-14 – 2020-02-15 (×2): 0.5 mg via ORAL
  Filled 2020-02-14 (×2): qty 1

## 2020-02-14 MED ORDER — METOPROLOL TARTRATE 5 MG/5ML IV SOLN
2.5000 mg | Freq: Once | INTRAVENOUS | Status: AC
  Administered 2020-02-14: 2.5 mg via INTRAVENOUS
  Filled 2020-02-14: qty 5

## 2020-02-14 MED ORDER — DULOXETINE HCL 60 MG PO CPEP
60.0000 mg | ORAL_CAPSULE | Freq: Every day | ORAL | Status: DC
  Administered 2020-02-14 – 2020-02-16 (×3): 60 mg via ORAL
  Filled 2020-02-14 (×3): qty 1

## 2020-02-14 MED ORDER — METOPROLOL TARTRATE 25 MG PO TABS
25.0000 mg | ORAL_TABLET | Freq: Two times a day (BID) | ORAL | Status: DC
  Administered 2020-02-14 – 2020-02-16 (×4): 25 mg via ORAL
  Filled 2020-02-14 (×5): qty 1

## 2020-02-14 MED ORDER — LEVOTHYROXINE SODIUM 25 MCG PO TABS
125.0000 ug | ORAL_TABLET | Freq: Every day | ORAL | Status: DC
  Administered 2020-02-14 – 2020-02-16 (×3): 125 ug via ORAL
  Filled 2020-02-14 (×3): qty 1

## 2020-02-14 MED ORDER — METHYLPREDNISOLONE SODIUM SUCC 40 MG IJ SOLR
40.0000 mg | Freq: Two times a day (BID) | INTRAMUSCULAR | Status: DC
  Administered 2020-02-15 – 2020-02-16 (×3): 40 mg via INTRAVENOUS
  Filled 2020-02-14 (×3): qty 1

## 2020-02-14 MED ORDER — FUROSEMIDE 10 MG/ML IJ SOLN
40.0000 mg | Freq: Once | INTRAMUSCULAR | Status: AC
  Administered 2020-02-14: 40 mg via INTRAVENOUS

## 2020-02-14 MED ORDER — GABAPENTIN 300 MG PO CAPS
300.0000 mg | ORAL_CAPSULE | Freq: Three times a day (TID) | ORAL | Status: DC
  Administered 2020-02-14 – 2020-02-16 (×6): 300 mg via ORAL
  Filled 2020-02-14 (×6): qty 1

## 2020-02-14 NOTE — Progress Notes (Signed)
PROGRESS NOTE  Brandi Raymond  DOB: [birth year]  PCP: Binnie Rail, MD ONG:[id number]  DOA: 02/12/2020  LOS: 2 days   Chief Complaint  Patient presents with  . Shortness of Breath   Brief narrative: Brandi Raymond is a 86 y.o. 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  Patient was brought to the ED on 7/29 after she was found on the floor by family member.   In the ED, patient was confused, had fever of 101.2, tachycardic, tachypneic, hypoxic and initially required oxygen via nonrebreather to maintain saturation over 90%. Initial blood gas showed a pH of 7.26, PCO2 elevated to 71,  WBC 10.2., lactic acid elevated 2.8 > 1.8 after IV hydration Chest x-ray right lower lobe scarring versus infiltrates.  She was admitted to hospitalist service for further evaluation management.  Subjective: Patient was seen and examined this morning.  Elderly female.  Propped up in bed.  Not in distress.   On 3 L oxygen by nasal cannula.  Heart rate gradually slowing down to less than 100. Labs from this morning with WBC count down to 12.8.  Assessment/Plan: Acute on chronic hypoxic and hypercapnic respiratory failure Acute exacerbation of COPD Right lower lobe pneumonia -likely aspiration pneumonia -Presented with altered mental status, shortness of breath. -Blood gas showed CO2 level elevated up to 83. -Respiratory status mental status later improved with high flow oxygen by nasal cannula. -Unable to use BiPAP because of altered mentation and vomiting -Currently on 3 L oxygen only. -Currently on IV cefepime, IV azithromycin, and IV vancomycin. -On IV Solu-Medrol 60 mg twice daily, DuoNebs.  I will reduce Solu-Medrol to 40 mg twice daily -PCCM consultation appreciated.  Acute metabolic encephalopathy -Likely secondary to hypercapnia.   Also contributed by poor oral intake, dehydration -Continue to monitor mental status changes.  ?  GI bleeding -Patient had one episode of coffee-ground emesis -Has history of GERD and hiatal hernia. -On Protonix at home. -Continue IV Protonix while on IV steroids. -Hemoglobin at 9.3.  Generalized weakness, impaired mobility -PT evaluation obtained.  SNF recommended.  Patient prefers to go home at discharge.  Elevated creatinine/elevated lactic acid -Secondary to dehydration, anaerobic respiration -Both have improved this morning. -At home, patient was supposed to be Lasix 40 mg every other day. -Per RN, patient having dyspnea again this afternoon.  Give 1 dose of IV Lasix. -Continue to monitor  Cardiovascular issues: HTN, CAD, chronic A. Fib -Home meds include metoprolol tartrate 25 mg daily, Eliquis 2.5 mg twice daily, pravastatin 40 mg daily -Resume all. -Continue to monitor blood pressure, heart rate and cardiac symptoms  Advanced dementia/anxiety -Home meds include Aricept, Klonopin, Cymbalta, Neurontin -Mental status improved.  Resume home medicines.  Mobility: PT evaluation Code Status:   Code Status: DNR  Nutritional status: Body mass index is 23.38 kg/m.     Diet Order            Diet regular Room service appropriate? Yes; Fluid consistency: Thin  Diet effective now                 DVT prophylaxis: Place and maintain sequential compression device Start: 02/13/20 0457   Antimicrobials:  IV cefepime, IV azithromycin, IV vancomycin Fluid: No IV fluid  Consultants: PCCM Family Communication:  Discussed with patient's daughter at bedside.  Status is: Inpatient  Remains inpatient appropriate because:Hemodynamically  unstable, Ongoing diagnostic testing needed not appropriate for outpatient work up and IV treatments appropriate due to intensity of illness or inability to take PO   Dispo: The patient is from: Home              Anticipated d/c is to: SNF               Anticipated d/c date is: 3 days              Patient currently is not medically stable to d/c.   Infusions:  . azithromycin 500 mg (02/14/20 1149)  . ceFEPime (MAXIPIME) IV 2 g (02/14/20 0855)  . [START ON 02/15/2020] vancomycin      Scheduled Meds: . arformoterol  15 mcg Nebulization BID  . Chlorhexidine Gluconate Cloth  6 each Topical Daily  . clonazePAM  0.5 mg Oral QHS  . docusate sodium  100 mg Oral QHS  . donepezil  10 mg Oral QHS  . DULoxetine  60 mg Oral Daily  . furosemide      . gabapentin  300 mg Oral TID  . [START ON 02/15/2020] methylPREDNISolone (SOLU-MEDROL) injection  40 mg Intravenous Q12H  . metoprolol tartrate  25 mg Oral BID  . pantoprazole (PROTONIX) IV  40 mg Intravenous Q12H  . pravastatin  40 mg Oral QHS    Antimicrobials: Anti-infectives (From admission, onward)   Start     Dose/Rate Route Frequency Ordered Stop   02/15/20 0600  vancomycin (VANCOCIN) IVPB 1000 mg/200 mL premix     Discontinue     1,000 mg 200 mL/hr over 60 Minutes Intravenous Every 48 hours 02/13/20 0243     02/13/20 0800  cefTRIAXone (ROCEPHIN) 2 g in sodium chloride 0.9 % 100 mL IVPB  Status:  Discontinued        2 g 200 mL/hr over 30 Minutes Intravenous Every 24 hours 02/13/20 0229 02/13/20 0457   02/13/20 0800  azithromycin (ZITHROMAX) 500 mg in sodium chloride 0.9 % 250 mL IVPB     Discontinue     500 mg 250 mL/hr over 60 Minutes Intravenous Every 24 hours 02/13/20 0229 02/18/20 0759   02/13/20 0600  ceFEPIme (MAXIPIME) 2 g in sodium chloride 0.9 % 100 mL IVPB     Discontinue     2 g 200 mL/hr over 30 Minutes Intravenous 2 times daily 02/13/20 0503     02/13/20 0315  vancomycin (VANCOREADY) IVPB 1250 mg/250 mL        1,250 mg 166.7 mL/hr over 90 Minutes Intravenous  Once 02/13/20 0243 02/13/20 1919   02/12/20 1330  Ampicillin-Sulbactam (UNASYN) 3 g in sodium chloride 0.9 % 100 mL IVPB  Status:  Discontinued        3 g 200 mL/hr over 30 Minutes Intravenous Every 8  hours 02/12/20 1250 02/13/20 0232   02/12/20 0830  cefTRIAXone (ROCEPHIN) 1 g in sodium chloride 0.9 % 100 mL IVPB        1 g 200 mL/hr over 30 Minutes Intravenous  Once 02/12/20 0816 02/12/20 0925   02/12/20 0830  azithromycin (ZITHROMAX) 500 mg in sodium chloride 0.9 % 250 mL IVPB        500 mg 250 mL/hr over 60 Minutes Intravenous  Once 02/12/20 0816 02/12/20 0957      PRN meds: acetaminophen, ipratropium-albuterol, ondansetron (ZOFRAN) IV   Objective: Vitals:   02/14/20 0858 02/14/20 1322  BP:    Pulse:    Resp:    Temp:  SpO2: 98% 100%    Intake/Output Summary (Last 24 hours) at 02/14/2020 1411 Last data filed at 02/14/2020 0500 Gross per 24 hour  Intake 470.83 ml  Output 1725 ml  Net -1254.17 ml   Filed Weights   02/12/20 0746 02/12/20 1441  Weight: 60.3 kg 61.8 kg   Weight change:  Body mass index is 23.38 kg/m.   Physical Exam: General exam: Appears calm and comfortable.  Not in physical distress Skin: No rashes, lesions or ulcers. HEENT: Atraumatic, normocephalic, supple neck, no obvious bleeding Lungs: Crackles present on the left upper to midlung on my examination CVS: Regular rate and rhythm, no murmur GI/Abd soft, nontender, nondistended CNS: Alert, awake, oriented to place Psychiatry: Mood appropriate Extremities: No pedal edema, no calf tenderness  Data Review: I have personally reviewed the laboratory data and studies available.  Recent Labs  Lab 02/12/20 0745 02/12/20 0816 02/13/20 0221 02/14/20 0144  WBC 10.2  --  21.6* 12.8*  NEUTROABS 7.4  --  19.1* 11.7*  HGB 10.9* 12.2 10.0* 9.3*  HCT 39.2 36.0 35.4* 32.7*  MCV 93.1  --  92.4 91.3  PLT 154  --  164 124*   Recent Labs  Lab 02/12/20 0745 02/12/20 0816 02/13/20 0221 02/14/20 0144  NA 143 142 142 144  K 4.1 4.1 5.1 4.5  CL 102  --  105 103  CO2 29  --  29 32  GLUCOSE 168*  --  157* 127*  BUN 18  --  18 26*  CREATININE 1.14*  --  0.97 0.91  CALCIUM 8.4*  --  7.8* 7.7*  MG   --   --   --  2.1  PHOS  --   --   --  3.0   Lab Results  Component Value Date   HGBA1C 6.0 (H) 01/11/2020       Component Value Date/Time   CHOL 200 12/13/2017 1109   TRIG 120.0 12/13/2017 1109   HDL 92.70 12/13/2017 1109   CHOLHDL 2 12/13/2017 1109   VLDL 24.0 12/13/2017 1109   LDLCALC 83 12/13/2017 1109   LDLDIRECT 98.7 08/13/2007 0945    Signed, Terrilee Croak, MD [facility] Pager: [phone] (Secure Chat preferred). 02/14/2020

## 2020-02-14 NOTE — Significant Event (Signed)
HOSPITAL MEDICINE OVERNIGHT EVENT NOTE  Notified by nursing patient has gone into rapid atrial fibrillation with heart rate ranging between 120s and 140's since approximately 2am.   Patient has known history of Afib.  Patient is only on short acting Metoprolol Tartrate 25mg  Qdaily however.    Labs this morning reveal normal potassium and magnesium.  Patient denies chest pain.  No change in respiratory status, oxygen requirements have been stable.  Patient exhibiting good urine output and denies pain.  Will administer one time dose of 2.5mg  IV Metoprolol.    Sherryll Burger [place]

## 2020-02-14 NOTE — Progress Notes (Signed)
Modified Barium Swallow Progress Note  Patient Details  Name: Brandi Raymond MRN: [id number] Date of Birth: 27-Sep-1933  Today's Date: 02/14/2020  Modified Barium Swallow completed.  Full report located under Chart Review in the Imaging Section.  Brief recommendations include the following:  Clinical Impression  Pt presents with mild oropharyngeal dysphagia c/b premature spillage, delayed swallow initiation, reduce laryngeal elevation and excusion, incomplete laryngeal closure, and diminished sensation.  These deficits resulted in silent penetration above the vocal folds with all consistencies of liquid.  Penetration was patially cleared with cued throat clear.  There was no aspiration visualized.  Chin tuck was not beneficial to prevent penetration.  There was no penetration with puree or solid textures.  There was upper esophageal stasis of solid texture.  With pill simulation, esophageal stasis of tablet was noted on esophageal sweep, and there was backflow of liquid was as well.  Although no aspiration was observed on this study there remains a risk of post prandial aspiration of static penetration in laryngeal vestibule, and a risk of aspiration of reflux given backflow observed during today's study.  Recommend regular texture diet with thin liquid with frequent use of throat clear to help remove any penetration from laryngeal vestibule.  Pt will need 1:1 supervision wil PO intake, as she could not demonstrate use of throat clear independently when drinking in the fluoro suite.  SLP will work with pt to address deficits noted and continued speech therapy is recommended at next level of care after discharge.   Swallow Evaluation Recommendations       SLP Diet Recommendations: Regular solids;Thin liquid   Liquid Administration via: Cup   Medication Administration: Whole meds with liquid   Supervision: Full supervision/cueing for compensatory strategies   Compensations: Slow rate;Small  sips/bites (Clear throat regularly when drinking liquid)   Postural Changes: Seated upright at 90 degrees;Remain semi-upright after after feeds/meals (Comment)   Oral Care Recommendations: Oral care BID        Celedonio Savage, [place], [place] Office: [phone]; Pager 7/31: [phone] 02/14/2020,11:08 AM

## 2020-02-15 ENCOUNTER — Inpatient Hospital Stay (HOSPITAL_COMMUNITY): Payer: PPO

## 2020-02-15 ENCOUNTER — Telehealth: Payer: Self-pay | Admitting: Pulmonary Disease

## 2020-02-15 MED ORDER — APIXABAN 2.5 MG PO TABS
2.5000 mg | ORAL_TABLET | Freq: Two times a day (BID) | ORAL | Status: DC
  Administered 2020-02-15 – 2020-02-16 (×3): 2.5 mg via ORAL
  Filled 2020-02-15 (×3): qty 1

## 2020-02-15 NOTE — Discharge Instructions (Signed)

## 2020-02-15 NOTE — TOC Initial Note (Signed)
Transition of Care [facility]) - Initial/Assessment Note    Patient Details  Name: Brandi Raymond MRN: [id number] Date of Birth: March 09, 1934  Transition [facility]) CM/SW Contact:    Arvella Merles, [place] Phone Number: 02/15/2020, 3:59 PM  Clinical Narrative:                 Patient currently oriented x3, not fully oriented to situation. CSW attempted to contact patient's spouse Brandi Raymond. CSW left a voicemail and followed up with patient's daughter Brandi Raymond to discuss PT recommendation of SNF placement or 24/7 supervision. Patient's daughter expressed patient is refusing SNF and would like to return home. She stated patient has 24/7 supervision with spouse, but he does not get around as well with his age.  Brandi Raymond stated patient has DME at home and may only need a 3in1.  Expected Discharge Plan: [place] Barriers to Discharge: Continued Medical Work up   Patient Goals and CMS Choice   CMS Medicare.gov Compare Post Acute Care list provided to:: Patient Represenative (must comment) Brandi Raymond) Choice offered to / list presented to : Adult Children  Expected Discharge Plan and Services Expected Discharge Plan: [place]       Living arrangements for the past 2 months: Single Family Home                                      Prior Living Arrangements/Services Living arrangements for the past 2 months: Single Family Home Lives with:: Spouse   Do you feel safe going back to the place where you live?: Yes      Need for Family Participation in Patient Care: Yes (Comment) Care giver support system in place?: Yes (comment)   Criminal Activity/Legal Involvement Pertinent to Current Situation/Hospitalization: No - Comment as needed  Activities of Daily Living Home Assistive Devices/Equipment: Cane (specify quad or straight), Walker (specify type) ADL Screening (condition at time of admission) Patient's cognitive ability adequate to safely complete  daily activities?: Yes Is the patient deaf or have difficulty hearing?: No Does the patient have difficulty seeing, even when wearing glasses/contacts?: No Does the patient have difficulty concentrating, remembering, or making decisions?: No Patient able to express need for assistance with ADLs?: Yes Does the patient have difficulty dressing or bathing?: No Independently performs ADLs?: No Communication: Independent Dressing (OT): Independent Grooming: Independent Feeding: Independent Bathing: Independent Toileting: Appropriate for developmental age In/Out Bed: Needs assistance Is this a change from baseline?: Pre-admission baseline Walks in Home: Independent Does the patient have difficulty walking or climbing stairs?: Yes Weakness of Legs: Both Weakness of Arms/Hands: None  Permission Sought/Granted                  Emotional Assessment   Attitude/Demeanor/Rapport: Unable to Assess   Orientation: : Oriented to Self, Oriented to Place, Oriented to  Time Alcohol / Substance Use: Not Applicable Psych Involvement: No (comment)  Admission diagnosis:  COPD exacerbation ([place]) [J44.1] PNA (pneumonia) [J18.9] Community acquired pneumonia, unspecified laterality [J18.9] Patient Active Problem List   Diagnosis Date Noted  . PNA (pneumonia) 02/12/2020  . Right lower lobe pneumonia 01/13/2020  . CAP (community acquired pneumonia) 01/11/2020  . Acute respiratory failure ([place]) 09/02/2019  . Acute diastolic CHF (congestive heart failure) ([place]) 09/02/2019  . Paroxysmal atrial fibrillation (HCC)   . Second degree Mobitz II AV block 08/21/2019  . Second degree AV  block 08/08/2019  . SOB (shortness of breath) 08/05/2019  . Chest tightness 08/05/2019  . GERD (gastroesophageal reflux disease) 05/21/2019  . Chronic respiratory failure with hypoxia ([place]) 05/21/2019  . Medication management 05/21/2019  . Stage I squamous cell carcinoma of right lung ([place]) 02/06/2019  . Constipation  09/11/2018  . COPD exacerbation ([place]) 09/04/2018  . Right lower lobe lung mass 09/04/2018  . Closed compression fracture of L1 lumbar vertebra, initial encounter ([place]) 09/04/2018  . Preoperative clearance 07/19/2018  . Lower back pain 06/25/2018  . Cubital tunnel syndrome on left 05/22/2018  . Dupuytren's contracture of both hands 05/10/2018  . Primary osteoarthritis of both knees 05/10/2018  . Difficulty urinating 05/10/2018  . Osteoporosis 06/14/2017  . Prediabetes 06/23/2015  . Depression 06/23/2015  . Carotid stenosis 10/12/2014  . Sleep disorder 04/09/2014  . Mesenteric artery stenosis ([place]) 01/13/2013  . Thoracic aneurysm without mention of rupture 11/04/2012  . Chronic mesenteric ischemia ([place]) 10/08/2012  . Memory deficit 06/20/2012  . Irritable bowel syndrome 06/20/2012  . Ocular myasthenia gravis ([place]) 06/20/2012  . PVD (peripheral vascular disease) ([place]) 06/20/2012  . Hereditary and idiopathic peripheral neuropathy 05/22/2012  . Syncope 08/28/2011  . ABDOMINAL BRUIT 08/17/2009  . Coronary atherosclerosis 09/05/2008  . Hypothyroidism 05/26/2008  . VITAMIN D DEFICIENCY 05/26/2008  . CHOLELITHIASIS 12/06/2007  . DIVERTICULOSIS, COLON 12/05/2007  . HYPERLIPIDEMIA 08/19/2007  . COPD (chronic obstructive pulmonary disease) with emphysema ([place]) 08/19/2007  . CIGARETTE SMOKER 02/06/2007  . Essential hypertension 02/06/2007  . COLONIC POLYPS 11/21/2004   PCP:  Binnie Rail, MD Pharmacy:   [id number] - [place], [place] [address] [place] [address] Phone: [phone] Fax: [phone]  [facility] [place], [place] - [address] [place] [address] Phone: [phone] Fax: [phone]     Social Determinants of Health (SDOH) Interventions    Readmission Risk Interventions Readmission Risk Prevention Plan 01/12/2020  PCP or Specialist Appt within 3-5 Days Complete   HRI or [place] Complete  Social Work Consult for [place] Planning/Counseling Complete  Palliative Care Screening Not Applicable  Medication Review (RN Care Manager) Complete  Some recent data might be hidden

## 2020-02-15 NOTE — Progress Notes (Addendum)
PROGRESS NOTE  Brandi Raymond  DOB: [birth year]  PCP: Binnie Rail, MD WUJ:[id number]  DOA: 02/12/2020  LOS: 3 days   Chief Complaint  Patient presents with  . Shortness of Breath   Brief narrative: Brandi Raymond is a 86 y.o. 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  Patient was brought to the ED on 7/29 after she was found on the floor by family member.   In the ED, patient was confused, had fever of 101.2, tachycardic, tachypneic, hypoxic and initially required oxygen via nonrebreather to maintain saturation over 90%. Initial blood gas showed a pH of 7.26, PCO2 elevated to 71,  WBC 10.2., lactic acid elevated 2.8 > 1.8 after IV hydration Chest x-ray right lower lobe scarring versus infiltrates.  She was admitted to hospitalist service for further evaluation management.  Subjective: Patient was seen and examined this morning.  Elderly female.   Sitting up in chair.  Not in distress on 3 L oxygen by nasal collar.  Patient's husband and daughter at bedside.  Assessment/Plan: Acute on chronic hypoxic and hypercapnic respiratory failure Acute exacerbation of COPD Right lower lobe pneumonia -likely aspiration pneumonia -Presented with altered mental status, shortness of breath. -Blood gas showed CO2 level elevated up to 83. -Respiratory status, mental status later improved with high flow oxygen by nasal cannula. -Unable to use BiPAP because of altered mentation and vomiting -Currently on 3 L oxygen only. -Currently on IV cefepime, IV azithromycin, and IV vancomycin.  -We will stop IV vancomycin today. -Currently on IV Solu-Medrol 60 mg daily, DuoNebs.  Continue the same. -Repeat chest x-ray today. -PCCM consultation appreciated.  Acute metabolic encephalopathy -Likely secondary to hypercapnia.  Also contributed by poor  oral intake, dehydration -Continue to monitor mental status changes.  ? GI bleeding -Patient had one episode of coffee-ground emesis -Has history of GERD and hiatal hernia. -On Protonix at home. -Continue IV Protonix while on IV steroids. -Hemoglobin at 9.3.  Repeat blood work Architectural technologist.  Generalized weakness, impaired mobility -PT evaluation obtained.  SNF recommended.  Patient prefers to go home at discharge.  Elevated creatinine/elevated lactic acid -Secondary to dehydration, anaerobic respiration -Both have improved this morning. -At home, patient was supposed to be Lasix 40 mg every other day. -Per RN, patient was having dyspnea again yesterday afternoon.  1 dose of IV Lasix was given. -Repeat chest x-ray ordered for today.-No need of IV Lasix today. -Continue to monitor  Cardiovascular issues: HTN, CAD, chronic A. Fib -Home meds include metoprolol tartrate 25 mg daily, Eliquis 2.5 mg twice daily, pravastatin 40 mg daily -Resume all including Eliquis. -Continue to monitor blood pressure, heart rate and cardiac symptoms  Advanced dementia/anxiety -Home meds include Aricept, Klonopin, Cymbalta, Neurontin -Home meds resumed.  Mobility: PT evaluation Code Status:   Code Status: DNR  Nutritional status: Body mass index is 23.38 kg/m.     Diet Order            Diet regular Room service appropriate? Yes; Fluid consistency: Thin  Diet effective now                 DVT prophylaxis: Place and maintain sequential compression device Start: 02/13/20 0457   Antimicrobials:  IV cefepime, IV azithromycin Fluid: No IV fluid  Consultants: PCCM Family Communication:  Discussed with patient's daughter at bedside.  Status is: Inpatient  Remains inpatient appropriate because:Hemodynamically unstable, Ongoing diagnostic testing needed not appropriate for outpatient work up and IV treatments appropriate due to intensity of illness or inability to take PO   Dispo: The  patient is from: Home              Anticipated d/c is to: Home tomorrow              Anticipated d/c date is: 1 day              Patient currently is not medically stable to d/c.   Infusions:  . azithromycin 500 mg (02/14/20 1149)  . ceFEPime (MAXIPIME) IV 2 g (02/14/20 2009)  . vancomycin 1,000 mg (02/15/20 0631)    Scheduled Meds: . arformoterol  15 mcg Nebulization BID  . Chlorhexidine Gluconate Cloth  6 each Topical Daily  . clonazePAM  0.5 mg Oral QHS  . docusate sodium  100 mg Oral QHS  . donepezil  10 mg Oral QHS  . DULoxetine  60 mg Oral Daily  . gabapentin  300 mg Oral TID  . levothyroxine  125 mcg Oral Q0600  . methylPREDNISolone (SOLU-MEDROL) injection  40 mg Intravenous Q12H  . metoprolol tartrate  25 mg Oral BID  . pantoprazole (PROTONIX) IV  40 mg Intravenous Q12H  . pravastatin  40 mg Oral QHS    Antimicrobials: Anti-infectives (From admission, onward)   Start     Dose/Rate Route Frequency Ordered Stop   02/15/20 0600  vancomycin (VANCOCIN) IVPB 1000 mg/200 mL premix     Discontinue     1,000 mg 200 mL/hr over 60 Minutes Intravenous Every 48 hours 02/13/20 0243     02/13/20 0800  cefTRIAXone (ROCEPHIN) 2 g in sodium chloride 0.9 % 100 mL IVPB  Status:  Discontinued        2 g 200 mL/hr over 30 Minutes Intravenous Every 24 hours 02/13/20 0229 02/13/20 0457   02/13/20 0800  azithromycin (ZITHROMAX) 500 mg in sodium chloride 0.9 % 250 mL IVPB     Discontinue     500 mg 250 mL/hr over 60 Minutes Intravenous Every 24 hours 02/13/20 0229 02/18/20 1059   02/13/20 0600  ceFEPIme (MAXIPIME) 2 g in sodium chloride 0.9 % 100 mL IVPB     Discontinue     2 g 200 mL/hr over 30 Minutes Intravenous 2 times daily 02/13/20 0503     02/13/20 0315  vancomycin (VANCOREADY) IVPB 1250 mg/250 mL        1,250 mg 166.7 mL/hr over 90 Minutes Intravenous  Once 02/13/20 0243 02/13/20 1919   02/12/20 1330  Ampicillin-Sulbactam (UNASYN) 3 g in sodium chloride 0.9 % 100 mL IVPB  Status:   Discontinued        3 g 200 mL/hr over 30 Minutes Intravenous Every 8 hours 02/12/20 1250 02/13/20 0232   02/12/20 0830  cefTRIAXone (ROCEPHIN) 1 g in sodium chloride 0.9 % 100 mL IVPB        1 g 200 mL/hr over 30 Minutes Intravenous  Once 02/12/20 0816 02/12/20 0925   02/12/20 0830  azithromycin (ZITHROMAX) 500 mg in sodium chloride 0.9 % 250 mL IVPB        500 mg 250 mL/hr over 60 Minutes Intravenous  Once 02/12/20 0816 02/12/20 0957      PRN meds: acetaminophen, ipratropium-albuterol, ondansetron (ZOFRAN) IV   Objective: Vitals:   02/15/20 0752 02/15/20 0848  BP: (!) 151/83   Pulse: 78   Resp: 20  Temp: 98.2 F (36.8 C)   SpO2: 100% 99%   No intake or output data in the 24 hours ending 02/15/20 1126 Filed Weights   02/12/20 0746 02/12/20 1441  Weight: 60.3 kg 61.8 kg   Weight change:  Body mass index is 23.38 kg/m.   Physical Exam: General exam: Appears calm and comfortable.  Not in physical distress Skin: No rashes, lesions or ulcers. HEENT: Atraumatic, normocephalic, supple neck, no obvious bleeding Lungs: Scattered crackles present bilaterally. CVS: Regular rate and rhythm, no murmur GI/Abd soft, nontender, nondistended CNS: Alert, awake, oriented to place Psychiatry: Mood appropriate Extremities: No pedal edema, no calf tenderness  Data Review: I have personally reviewed the laboratory data and studies available.  Recent Labs  Lab 02/12/20 0745 02/12/20 0816 02/13/20 0221 02/14/20 0144  WBC 10.2  --  21.6* 12.8*  NEUTROABS 7.4  --  19.1* 11.7*  HGB 10.9* 12.2 10.0* 9.3*  HCT 39.2 36.0 35.4* 32.7*  MCV 93.1  --  92.4 91.3  PLT 154  --  164 124*   Recent Labs  Lab 02/12/20 0745 02/12/20 0816 02/13/20 0221 02/14/20 0144  NA 143 142 142 144  K 4.1 4.1 5.1 4.5  CL 102  --  105 103  CO2 29  --  29 32  GLUCOSE 168*  --  157* 127*  BUN 18  --  18 26*  CREATININE 1.14*  --  0.97 0.91  CALCIUM 8.4*  --  7.8* 7.7*  MG  --   --   --  2.1  PHOS   --   --   --  3.0   Lab Results  Component Value Date   HGBA1C 6.0 (H) 01/11/2020       Component Value Date/Time   CHOL 200 12/13/2017 1109   TRIG 120.0 12/13/2017 1109   HDL 92.70 12/13/2017 1109   CHOLHDL 2 12/13/2017 1109   VLDL 24.0 12/13/2017 1109   LDLCALC 83 12/13/2017 1109   LDLDIRECT 98.7 08/13/2007 0945    Signed, Terrilee Croak, MD [facility] Pager: [phone] (Secure Chat preferred). 02/15/2020

## 2020-02-15 NOTE — Telephone Encounter (Signed)
Ms. Vlcek is planned to discharge on 8/2.  Please schedule her for a hospital follow up within 1-2 weeks with an NP and notify her of the appointment.  Thank you.    Noe Gens, MSN, NP-C [place] [place] [facility] 02/15/2020, 2:58 PM   Please see [email or web address] for pager details.

## 2020-02-15 NOTE — Progress Notes (Signed)
ANTICOAGULATION CONSULT NOTE - Initial Consult  Pharmacy Consult for apixaban Indication: atrial fibrillation  Allergies  Allergen Reactions  . Silver Sulfadiazine Other (See Comments)    lowers white blood count     Patient Measurements: Height: 5\' 4"  (162.6 cm) Weight: 61.8 kg (136 lb 3.2 oz) IBW/kg (Calculated) : 54.7  Vital Signs: Temp: 98.2 F (36.8 C) (08/01 0752) BP: 151/83 (08/01 0752) Pulse Rate: 78 (08/01 0752)  Labs: Recent Labs    02/13/20 0221 02/14/20 0144  HGB 10.0* 9.3*  HCT 35.4* 32.7*  PLT 164 124*  APTT 39*  --   LABPROT 14.4  --   INR 1.2  --   CREATININE 0.97 0.91    Estimated Creatinine Clearance: 39.7 mL/min (by C-G formula based on SCr of 0.91 mg/dL).   Medical History: Past Medical History:  Diagnosis Date  . Adenomatous colon polyp   . Arthritis   . CAD (coronary artery disease)    PTCA of RCA   . Carotid artery occlusion   . Cervical spine fracture ([place])   . COPD (chronic obstructive pulmonary disease) ([place])   . Depression   . Diverticulosis   . Gastritis 09/15/1991  . GERD (gastroesophageal reflux disease) 09/15/1991   Dr Sharlett Iles  . Hiatal hernia 09/15/1991  . Hip fracture, right ([place])   . HLD (hyperlipidemia)   . HTN (hypertension)   . Hyperplastic polyps of stomach 11/2007   colonoscopy  . Hypertension   . Hypothyroidism    affecting the left eye, proptosis  . Iron deficiency anemia   . Lung cancer ([place])    s/p XRT  . Macular degeneration of left eye   . Memory loss   . Mesenteric artery stenosis ([place])   . Myocardial infarction ([place]) 1993  . Ocular myasthenia gravis ([place])    Dr Jannifer Franklin  . Orthostatic hypotension 06/05/2013  . PONV (postoperative nausea and vomiting)   . Strabismus    left eye  . Syncope 1998    Medications:  Medications Prior to Admission  Medication Sig Dispense Refill Last Dose  . acetaminophen (TYLENOL) 500 MG tablet Take 1,000 mg by mouth every 6 (six) hours as needed for mild pain.    Past  Month at Unknown time  . Calcium-Magnesium-Vitamin D (CALCIUM 1200+D3 PO) Take 1 tablet by mouth at bedtime.    02/11/2020 at Unknown time  . clonazePAM (KLONOPIN) 0.5 MG tablet TAKE 1 TABLET BY MOUTH EVERYDAY AT BEDTIME (Patient taking differently: Take 0.5 mg by mouth at bedtime. ) 30 tablet 0 02/11/2020 at Unknown time  . docusate sodium (COLACE) 100 MG capsule Take 100 mg by mouth at bedtime.    02/11/2020 at Unknown time  . donepezil (ARICEPT) 10 MG tablet TAKE 1 TABLET BY MOUTH EVERYDAY AT BEDTIME (Patient taking differently: Take 10 mg by mouth at bedtime. ) 90 tablet 3 02/11/2020 at Unknown time  . DULoxetine (CYMBALTA) 60 MG capsule TAKE 1 CAPSULE BY MOUTH EVERY DAY (Patient taking differently: Take 60 mg by mouth daily. ) 90 capsule 2 02/11/2020 at Unknown time  . ELIQUIS 2.5 MG TABS tablet TAKE 1 TABLET BY MOUTH TWICE A DAY (Patient taking differently: Take 2.5 mg by mouth 2 (two) times daily. ) 60 tablet 5 02/11/2020 at 1830  . furosemide (LASIX) 40 MG tablet Take 1 tablet (40 mg total) by mouth every other day. 45 tablet 3 Past Week at Unknown time  . ipratropium (ATROVENT) 0.02 % nebulizer solution Take 2.5 mLs (0.5 mg total) by  nebulization every 6 (six) hours as needed for wheezing or shortness of breath. 75 mL 0 02/12/2020 at Unknown time  . levothyroxine (SYNTHROID) 125 MCG tablet TAKE 1 TABLET (125 MCG TOTAL) BY MOUTH DAILY BEFORE BREAKFAST. 90 tablet 1 02/11/2020 at Unknown time  . metoprolol tartrate (LOPRESSOR) 25 MG tablet Take 1 tablet (25 mg total) by mouth daily. 30 tablet 6 02/11/2020 at 0830  . Multiple Vitamins-Minerals (PRESERVISION AREDS 2 PO) Take 1 capsule by mouth 2 (two) times daily.   02/11/2020 at Unknown time  . pantoprazole (PROTONIX) 40 MG tablet Take 1 tablet (40 mg total) by mouth daily. 90 tablet 1 02/11/2020 at Unknown time  . potassium chloride (KLOR-CON) 10 MEQ tablet Take 1 tablet 10 meq every other day with Lasix. (Patient taking differently: Take 10 mEq by mouth  every other day. Take with lasix.) 45 tablet 3 Past Week at Unknown time  . pravastatin (PRAVACHOL) 40 MG tablet TAKE 1 TABLET BY MOUTH DAILY. FOLLOW-UP APPT W/LABS ARE DUE MUST SEE PROVIDER FOR FUTURE REFILLS (Patient taking differently: Take 40 mg by mouth at bedtime. ) 30 tablet 2 02/11/2020 at Unknown time  . PROLIA 60 MG/ML SOSY injection Inject 60 mg into the skin every 6 (six) months.    11/2019  . umeclidinium-vilanterol (ANORO ELLIPTA) 62.5-25 MCG/INH AEPB INHALE 1 PUFF BY MOUTH EVERY DAY (Patient taking differently: Inhale 1 puff into the lungs daily. ) 60 each 5 02/11/2020 at Unknown time  . gabapentin (NEURONTIN) 300 MG capsule Take 1 capsule (300 mg total) by mouth 4 (four) times daily. (Patient taking differently: Take 300 mg by mouth 3 (three) times daily. ) 120 capsule 5    Scheduled:  . apixaban  2.5 mg Oral BID  . arformoterol  15 mcg Nebulization BID  . Chlorhexidine Gluconate Cloth  6 each Topical Daily  . clonazePAM  0.5 mg Oral QHS  . docusate sodium  100 mg Oral QHS  . donepezil  10 mg Oral QHS  . DULoxetine  60 mg Oral Daily  . gabapentin  300 mg Oral TID  . levothyroxine  125 mcg Oral Q0600  . methylPREDNISolone (SOLU-MEDROL) injection  40 mg Intravenous Q12H  . metoprolol tartrate  25 mg Oral BID  . pantoprazole (PROTONIX) IV  40 mg Intravenous Q12H  . pravastatin  40 mg Oral QHS   PRN: acetaminophen, ipratropium-albuterol, ondansetron (ZOFRAN) IV  Assessment: 86 yo female admitted due right lower lob pneumonia. PTA the patient was on 2.5 mg apixaban BID for atrial fibrillation. Apixaban was held on 7/30 due to one episode of coffee ground emesis. Per Dr. Pietro Cassis, apixaban should be resumed at this time. Pharmacy is now consulted to dose apixaban.   CBC is stable and the patient has not had any issues with bleeding since the one episode on 7/30. Will continue to monitor for bleeding and resume the patient's home dose of apixaban.  Goal of Therapy:  Monitor platelets  by anticoagulation protocol: Yes   Plan:  Administer apixaban 2.5 mg PO BID Monitor for signs and symptoms of bleeding  Pharmacy will continue to follow peripherally and sign off at this time.   Shauna Hugh, PharmD, [place]  PGY-1 Pharmacy Resident 02/15/2020 12:30 PM  Please check [email or web address] for unit-specific pharmacy phone numbers.

## 2020-02-15 NOTE — Telephone Encounter (Signed)
Duplicate as I did not route to Triage Pool on first note -   Ms. Crusoe is planned to discharge on 8/2.  Please schedule her for a hospital follow up within 1-2 weeks with an NP and notify her of the appointment.  Thank you.    Noe Gens, MSN, NP-C [facility] [facility] 02/15/2020, 2:58 PM   Please see [email or web address] for pager details.

## 2020-02-15 NOTE — Progress Notes (Signed)
[place] PCCM Follow Up   Brief Patient Summary: 86 y/o F with hx of COPD, chronic hypoxic / hypercapnic respiratory failure on 2L O2, stage 1 Squamous Cell Carcinoma of the RLL s/p XRT, demenita, AF on Eliquis and HTN who presented to [facility] on 7/29 after being found down at home on the floor. Initial work up worrisome for acute on chronic hypercarbic respiratory failure (7.26 /71), suspected RLL aspiration PNA and encephalopathy.  She was treated with IV antibiotics, solumedrol, & duoneb.  She made slow improvement.      S: Pt reports feeling much better.  She is hopeful to go home tomorrow.   O: Blood pressure (!) 151/83, pulse 78, temperature 98.2 F (36.8 C), resp. rate 20, height 5\' 4"  (1.626 m), weight 61.8 kg, SpO2 99 %.  General: pleasant, elderly female lying in bed in NAD HEENT: MM pink/moist, no jvd, [place] O2 at 2.5L  Neuro: Awake / alert, oriented to details of admission (MBS and findings, food for lunch, her home O2 needs), speech clear, MAE  CV: s1s2 rrr, no m/r/g PULM: non-labored, moist cough, breath sounds bilaterally with occasional crackles  GI: soft, bsx4 active  Extremities: warm/dry, no edema  Skin: no rashes or lesions   A: Acute on Chronic Hypercarbic / Hypoxic Respiratory Failure  Acute Exacerbation of COPD  RLL Aspiration PNA  Acute Metabolic Encephalopathy   P: -assess ambulatory O2 needs prior to discharge to ensure her O2 remains stable with exertion (88-95%) -plan to discharge home 2L O2 unless above dictates new needs -complete 5 days abx -prednisone taper for discharge over 5 days to off  -continue home Anoro at discharge  -caution with sedating medications  -follow up with Pulmonary as outpatient (have notified office)   PCCM will be available PRN.  Please call back if new needs arise    Noe Gens, MSN, NP-C [facility] 02/15/2020, 2:19 PM   Please see [email or web address] for pager details.

## 2020-02-16 LAB — CBC WITH DIFFERENTIAL/PLATELET
Abs Immature Granulocytes: 0.16 10*3/uL — ABNORMAL HIGH (ref 0.00–0.07)
Basophils Absolute: 0 10*3/uL (ref 0.0–0.1)
Basophils Relative: 0 %
Eosinophils Absolute: 0 10*3/uL (ref 0.0–0.5)
Eosinophils Relative: 0 %
HCT: 30.4 % — ABNORMAL LOW (ref 36.0–46.0)
Hemoglobin: 8.8 g/dL — ABNORMAL LOW (ref 12.0–15.0)
Immature Granulocytes: 1 %
Lymphocytes Relative: 8 %
Lymphs Abs: 0.9 10*3/uL (ref 0.7–4.0)
MCH: 25.7 pg — ABNORMAL LOW (ref 26.0–34.0)
MCHC: 28.9 g/dL — ABNORMAL LOW (ref 30.0–36.0)
MCV: 88.6 fL (ref 80.0–100.0)
Monocytes Absolute: 0.8 10*3/uL (ref 0.1–1.0)
Monocytes Relative: 7 %
Neutro Abs: 9.4 10*3/uL — ABNORMAL HIGH (ref 1.7–7.7)
Neutrophils Relative %: 84 %
Platelets: 152 10*3/uL (ref 150–400)
RBC: 3.43 MIL/uL — ABNORMAL LOW (ref 3.87–5.11)
RDW: 14.6 % (ref 11.5–15.5)
WBC: 11.3 10*3/uL — ABNORMAL HIGH (ref 4.0–10.5)
nRBC: 0 % (ref 0.0–0.2)

## 2020-02-16 LAB — BASIC METABOLIC PANEL
Anion gap: 7 (ref 5–15)
BUN: 31 mg/dL — ABNORMAL HIGH (ref 8–23)
CO2: 30 mmol/L (ref 22–32)
Calcium: 7.4 mg/dL — ABNORMAL LOW (ref 8.9–10.3)
Chloride: 103 mmol/L (ref 98–111)
Creatinine, Ser: 0.93 mg/dL (ref 0.44–1.00)
GFR calc Af Amer: >60 mL/min (ref >60)
GFR calc non Af Amer: 56 mL/min — ABNORMAL LOW (ref >60)
Glucose, Bld: 113 mg/dL — ABNORMAL HIGH (ref 70–99)
Potassium: 3.4 mmol/L — ABNORMAL LOW (ref 3.5–5.1)
Sodium: 140 mmol/L (ref 135–145)

## 2020-02-16 LAB — PHOSPHORUS: Phosphorus: 2.2 mg/dL — ABNORMAL LOW (ref 2.5–4.6)

## 2020-02-16 LAB — MAGNESIUM: Magnesium: 2.5 mg/dL — ABNORMAL HIGH (ref 1.7–2.4)

## 2020-02-16 MED ORDER — POTASSIUM PHOSPHATES 15 MMOLE/5ML IV SOLN
20.0000 mmol | Freq: Once | INTRAVENOUS | Status: AC
  Administered 2020-02-16: 20 mmol via INTRAVENOUS
  Filled 2020-02-16: qty 6.67

## 2020-02-16 MED ORDER — CEFDINIR 300 MG PO CAPS
300.0000 mg | ORAL_CAPSULE | Freq: Two times a day (BID) | ORAL | 0 refills | Status: AC
Start: 2020-02-16 — End: 2020-02-21

## 2020-02-16 MED ORDER — SACCHAROMYCES BOULARDII 250 MG PO CAPS: 250.0000 mg | ORAL_CAPSULE | Freq: Two times a day (BID) | ORAL | 0 refills | Status: AC

## 2020-02-16 MED ORDER — PREDNISONE 10 MG PO TABS
ORAL_TABLET | ORAL | 0 refills | Status: DC
Start: 2020-02-16 — End: 2020-03-03

## 2020-02-16 MED ORDER — POTASSIUM CHLORIDE CRYS ER 20 MEQ PO TBCR
40.0000 meq | EXTENDED_RELEASE_TABLET | Freq: Once | ORAL | Status: AC
  Administered 2020-02-16: 40 meq via ORAL
  Filled 2020-02-16: qty 2

## 2020-02-16 NOTE — Assessment & Plan Note (Addendum)
-  Continue Anoro Ellipta 1 puff daily in the morning  - Use Atrovent nebulizer every 6 hours as needed for breakthrough shortness of breath or wheezing - Use incentive spirometer 5 to 10 breaths every hour as able - Take Mucinex 600 mg 1 to 2 tablets twice daily with a full glass of water for 10 days  Orders - Chest x-ray today - Labs (CBC with differential, BNP, be met)  Follow-up - 2 weeks telephone visit with [facility] NP

## 2020-02-16 NOTE — Assessment & Plan Note (Signed)
-   Orthostatic blood pressure negative today in office - Hold Lasix tomorrow until we speak - Encourage you push oral fluids - Recommend you use walker when you ambulate

## 2020-02-16 NOTE — TOC Transition Note (Signed)
Transition of Care [facility]) - CM/SW Discharge Note   Patient Details  Name: Brandi Raymond MRN: [id number] Date of Birth: 1933/09/02  Transition of Care [facility] CM/SW Contact:  Angelita Ingles, RN Phone Number: [phone]  02/16/2020, 11:37 AM   Clinical Narrative:  TOC consulted to set up [facility] PT/ RN. PT recommended a rolling walker but patient states that she has a rolling walker at home. Home Health has been set up with  [facility].Unable to provide RN but can provide PT. MD has been made aware. TOC will sign off no further needs noted at this time.     Final next level of care: [place] Barriers to Discharge: No Barriers Identified   Patient Goals and CMS Choice Patient states their goals for this hospitalization and ongoing recovery are:: Ready to go home CMS Medicare.gov Compare Post Acute Care list provided to:: Patient Choice offered to / list presented to : Patient  Discharge Placement                       Discharge Plan and Services                DME Arranged: N/A DME Agency: NA       HH Arranged: PT HH Agency: [place] Date [place]: 02/16/20 Time [place]: 0349 Representative spoke with at [place]: [place] ([place]) Interventions     Readmission Risk Interventions Readmission Risk Prevention Plan 01/12/2020  PCP or Specialist Appt within 3-5 Days Complete  HRI or [place] Complete  Social Work Consult for [place] Planning/Counseling Complete  Palliative Care Screening Not Applicable  Medication Review Press photographer) Complete  Some recent data might be hidden

## 2020-02-16 NOTE — Progress Notes (Signed)
Went over discharge papers with daughter, [name]

## 2020-02-16 NOTE — Discharge Summary (Signed)
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  Patient was brought to the ED on 7/29 after she was found on the floor by family member.   In the ED, patient was confused, had fever of 101.2, tachycardic, tachypneic, hypoxic and initially required oxygen via nonrebreather to maintain saturation over 90%. Initial blood gas showed a pH of 7.26, PCO2 elevated to 71,  WBC 10.2., lactic acid elevated 2.8 > 1.8 after IV hydration Chest x-ray right lower lobe scarring versus infiltrates.  She was admitted to hospitalist service for further evaluation management.  Hospital Course:  Acute on chronic hypoxic and hypercapnic respiratory failure Acute exacerbation of COPD Right lower lobe pneumonia -likely aspiration pneumonia -Presented with altered mental status, shortness of breath. -Blood gas showed CO2 level elevated up to 83. -Respiratory status, mental status later improved with high flow oxygen by nasal cannula. -Unable to use BiPAP because of altered mentation and vomiting. -Repeat chest x-ray on 8/1 showed near complete resolution of the pneumonia involving the RIGHT LOWER LOBE superimposed upon chronic scar/fibrosis. -Gradually weaned off oxygen.  Currently on 3 L oxygen only.  Continue the same at discharge. -Currently on IV cefepime, IV azithromycin. -5 more days of oral  Omnicef at home with probiotics. -Currently on IV Solu-Medrol 60 mg daily.  Switch to prednisone tapering course to stop the next 7 to 10 days -Continue bronchodilators.  Acute metabolic encephalopathy -Likely secondary to hypercapnia.  Also contributed by poor oral intake, dehydration -Mental status back to baseline now.  Coffee-ground emesis -Patient had one episode of coffee-ground emesis in the ED. -Has history of GERD and hiatal hernia. -On Protonix at home. -I will continue Protonix for now. -Hemoglobin stable.  Follow-up with GI as an outpatient.  Generalized weakness, impaired mobility -PT evaluation obtained.  SNF recommended.  Patient prefers to go home at discharge. -Home health PT RN ordered  Elevated creatinine/elevated lactic acid -Secondary to dehydration, anaerobic respiration -Subsequently improved. -At home, patient was on Lasix 40 mg every other day. -Resume the same post discharge.  Hypokalemia/hypophosphatemia -Levels low.  Replaced today.  Cardiovascular issues: HTN, CAD, chronic A. Fib -Home meds include metoprolol tartrate 25 mg daily, Eliquis 2.5 mg twice daily, pravastatin 40 mg daily -Resume all including Eliquis.  Advanced dementia/anxiety -Home meds include Aricept, Klonopin, Cymbalta, Neurontin -Continue home meds.  Okay to discharge to home today  Wound care:    Subjective:  Seen and examined this morning.  Pleasant elderly Caucasian female.  Sitting up in chair.  Not in distress.  On 3 L oxygen by nasal cannula.  Very eager to go home.   Discharge Exam:   Vitals:   02/15/20 1926 02/15/20 2143 02/16/20 0756 02/16/20 0839  BP:  91/67 (!) 143/95   Pulse: 98 68 70   Resp: 20 18 20    Temp:  98.8 F (37.1 C) (!) 97.5 F (36.4 C)   TempSrc:  Oral  SpO2: 98% 100% 94% 97%  Weight:      Height:        Body mass index is 23.38 kg/m.  General exam: Appears calm and comfortable.  Not in physical distress Skin: No rashes,  lesions or ulcers. HEENT: Atraumatic, normocephalic, supple neck, no obvious bleeding Lungs: Mild crackles on the left base, otherwise clear to auscultation bilaterally CVS: Regular rate and rhythm, no murmur GI/Abd soft, nontender, nondistended, bowel sound present CNS: Alert, awake, oriented x3 Psychiatry: Mood appropriate Extremities: No pedal edema, no calf tenderness  Follow ups:   Discharge Instructions    Increase activity slowly   Complete by: As directed       Follow-up Information    Binnie Rail, MD Follow up in 1 week(s).   Specialty: Internal Medicine Why: needs CBC/CMP/iron profile checked Contact information: [place] [address] [phone]        Lorretta Harp, MD .   Specialties: Cardiology, Radiology Contact information: [address] [place] [address] [phone]        [name], MD .   Specialty: Cardiology Contact information: [place] [address] [place] [address] [phone]               Recommendations for Outpatient Follow-Up:   1. Follow-up with PCP as an outpatient.  Discharge Instructions:  Follow with Primary MD Binnie Rail, MD in 7 days   Get CBC, CMP, iron profile checked in next visit within 1 week by PCP or SNF MD ( we routinely change or add medications that can affect your baseline labs and fluid status, therefore we recommend that you get the mentioned basic workup next visit with your PCP, your PCP may decide not to get them or add new tests based on their clinical decision)  On your next visit with your PCP, please Get Medicines reviewed and adjusted.  Please request your PCP  to go over all Hospital Tests and Procedure/Radiological results at the follow up, please get all Hospital records sent to your Prim MD by signing hospital release before you go home.  Activity: As tolerated with Full fall precautions use walker/cane & assistance as  needed  For Heart failure patients - Check your Weight same time everyday, if you gain over 2 pounds, or you develop in leg swelling, experience more shortness of breath or chest pain, call your Primary MD immediately. Follow Cardiac Low Salt Diet and 1.5 lit/day fluid restriction.  If you have smoked or chewed Tobacco in the last 2 yrs please stop smoking, stop any regular Alcohol  and or any Recreational drug use.  If you experience worsening of your admission symptoms, develop shortness of breath, life threatening emergency, suicidal or homicidal thoughts you must seek medical attention immediately by calling 911 or calling your MD immediately  if symptoms less severe.  You Must read complete instructions/literature along with all the possible adverse reactions/side effects for all the Medicines you take and that have been prescribed to you. Take any new Medicines after you have completely understood and accpet all the possible adverse reactions/side effects.   Do not drive, operate heavy machinery, perform activities at heights, swimming or participation in water activities or provide baby sitting services if your were admitted for syncope or siezures until you have seen by Primary MD or a Neurologist and advised to do so again.  Do not drive when taking Pain medications.  Do not take more  than prescribed Pain, Sleep and Anxiety Medications  Wear Seat belts while driving.   Please note You were cared for by a hospitalist during your hospital stay. If you have any questions about your discharge medications or the care you received while you were in the hospital after you are discharged, you can call the unit and asked to speak with the hospitalist on call if the hospitalist that took care of you is not available. Once you are discharged, your primary care physician will handle any further medical issues. Please note that NO REFILLS for any discharge medications will be authorized once you are  discharged, as it is imperative that you return to your primary care physician (or establish a relationship with a primary care physician if you do not have one) for your aftercare needs so that they can reassess your need for medications and monitor your lab values.    Allergies as of 02/16/2020      Reactions   Silver Sulfadiazine Other (See Comments)   lowers white blood count       Medication List    TAKE these medications   acetaminophen 500 MG tablet Commonly known as: TYLENOL Take 1,000 mg by mouth every 6 (six) hours as needed for mild pain.   Anoro Ellipta 62.5-25 MCG/INH Aepb Generic drug: umeclidinium-vilanterol INHALE 1 PUFF BY MOUTH EVERY DAY What changed:   how much to take  how to take this  when to take this  additional instructions   CALCIUM 1200+D3 PO Take 1 tablet by mouth at bedtime.   cefdinir 300 MG capsule Commonly known as: OMNICEF Take 1 capsule (300 mg total) by mouth 2 (two) times daily for 5 days.   clonazePAM 0.5 MG tablet Commonly known as: KLONOPIN TAKE 1 TABLET BY MOUTH EVERYDAY AT BEDTIME What changed: See the new instructions.   docusate sodium 100 MG capsule Commonly known as: COLACE Take 100 mg by mouth at bedtime.   donepezil 10 MG tablet Commonly known as: ARICEPT TAKE 1 TABLET BY MOUTH EVERYDAY AT BEDTIME What changed:   how much to take  how to take this  when to take this  additional instructions   DULoxetine 60 MG capsule Commonly known as: CYMBALTA TAKE 1 CAPSULE BY MOUTH EVERY DAY What changed: how much to take   Eliquis 2.5 MG Tabs tablet Generic drug: apixaban TAKE 1 TABLET BY MOUTH TWICE A DAY What changed: how much to take   furosemide 40 MG tablet Commonly known as: LASIX Take 1 tablet (40 mg total) by mouth every other day.   gabapentin 300 MG capsule Commonly known as: NEURONTIN Take 1 capsule (300 mg total) by mouth 4 (four) times daily. What changed: when to take this   ipratropium 0.02 %  nebulizer solution Commonly known as: ATROVENT Take 2.5 mLs (0.5 mg total) by nebulization every 6 (six) hours as needed for wheezing or shortness of breath.   levothyroxine 125 MCG tablet Commonly known as: SYNTHROID TAKE 1 TABLET (125 MCG TOTAL) BY MOUTH DAILY BEFORE BREAKFAST.   metoprolol tartrate 25 MG tablet Commonly known as: LOPRESSOR Take 1 tablet (25 mg total) by mouth daily.   pantoprazole 40 MG tablet Commonly known as: PROTONIX Take 1 tablet (40 mg total) by mouth daily.   potassium chloride 10 MEQ tablet Commonly known as: KLOR-CON Take 1 tablet 10 meq every other day with Lasix. What changed:   how much to take  how to take this  when to take this  additional instructions   pravastatin 40 MG tablet Commonly known as: PRAVACHOL TAKE 1 TABLET BY MOUTH DAILY. FOLLOW-UP APPT W/LABS ARE DUE MUST SEE PROVIDER FOR FUTURE REFILLS What changed: See the new instructions.   predniSONE 10 MG tablet Commonly known as: DELTASONE Take 4 tablets (40 mg) daily for 2 days, then, Take 3 tablets (30 mg) daily for 2 days, then, Take 2 tablets (20 mg) daily for 2 days, then, Take 1 tablets (10 mg) daily for 1 days, then stop   PRESERVISION AREDS 2 PO Take 1 capsule by mouth 2 (two) times daily.   Prolia 60 MG/ML Sosy injection Generic drug: denosumab Inject 60 mg into the skin every 6 (six) months.   saccharomyces boulardii 250 MG capsule Commonly known as: FLORASTOR Take 1 capsule (250 mg total) by mouth 2 (two) times daily for 7 days.       Time coordinating discharge: 35 minutes  The results of significant diagnostics from this hospitalization (including imaging, microbiology, ancillary and laboratory) are listed below for reference.    Procedures and Diagnostic Studies:   DG Chest 1 View  Result Date: 02/13/2020 CLINICAL DATA:  Respiratory failure EXAM: CHEST  1 VIEW COMPARISON:  02/12/2020 FINDINGS: Cardiac shadow is stable pacing device is again seen. Aortic  calcifications are noted. The lungs are well aerated bilaterally. Increasing right basilar infiltrate is seen. Hiatal hernia is noted. No bony abnormality is noted. IMPRESSION: Increasing right basilar infiltrate when compare with the previous exam. Electronically Signed   By: Inez Catalina M.D.   On: 02/13/2020 02:59   DG Abd 1 View  Result Date: 02/13/2020 CLINICAL DATA:  Vomiting EXAM: ABDOMEN - 1 VIEW COMPARISON:  None. FINDINGS: Relative paucity of bowel gas is noted. No free air is seen. No obstructive changes are noted. Degenerative changes of the lumbar spine are seen. Compression deformity at L1 and L3 are noted chronic in appearance. IMPRESSION: No acute abnormality noted. Electronically Signed   By: Inez Catalina M.D.   On: 02/13/2020 03:00   DG Chest Portable 1 View  Result Date: 02/12/2020 CLINICAL DATA:  Shortness of breath. EXAM: PORTABLE CHEST 1 VIEW COMPARISON:  January 29, 2020 FINDINGS: The lungs are hyperinflated. Mild, diffuse chronic appearing increased interstitial lung markings are noted. A dual lead AICD is in place. There is no evidence of acute infiltrate, pleural effusion or pneumothorax. Mild, stable elevation of the right hemidiaphragm is seen. The heart size and mediastinal contours are within normal limits. There is moderate severity calcification of the aortic arch. The visualized skeletal structures are unremarkable. IMPRESSION: Chronic appearing increased interstitial lung markings without evidence of acute or active cardiopulmonary disease. Electronically Signed   By: Virgina Norfolk M.D.   On: 02/12/2020 08:25     Labs:   Basic Metabolic Panel: Recent Labs  Lab 02/12/20 0745 02/12/20 0745 02/12/20 0816 02/12/20 0816 02/13/20 0221 02/13/20 0221 02/14/20 0144 02/16/20 0039  NA 143  --  142  --  142  --  144 140  K 4.1   < > 4.1   < > 5.1   < > 4.5 3.4*  CL 102  --   --   --  105  --  103 103  CO2 29  --   --   --  29  --  32 30  GLUCOSE 168*  --   --   --   157*  --  127* 113*  BUN 18  --   --   --  18  --  26* 31*  CREATININE 1.14*  --   --   --  0.97  --  0.91 0.93  CALCIUM 8.4*  --   --   --  7.8*  --  7.7* 7.4*  MG  --   --   --   --   --   --  2.1 2.5*  PHOS  --   --   --   --   --   --  3.0 2.2*   < > = values in this interval not displayed.   GFR Estimated Creatinine Clearance: 38.9 mL/min (by C-G formula based on SCr of 0.93 mg/dL). Liver Function Tests: Recent Labs  Lab 02/13/20 0221  AST 30  ALT 17  ALKPHOS 58  BILITOT 0.4  PROT 6.4*  ALBUMIN 3.1*   Recent Labs  Lab 02/13/20 0221  LIPASE 20   No results for input(s): AMMONIA in the last 168 hours. Coagulation profile Recent Labs  Lab 02/13/20 0221  INR 1.2    CBC: Recent Labs  Lab 02/12/20 0745 02/12/20 0816 02/13/20 0221 02/14/20 0144 02/16/20 0039  WBC 10.2  --  21.6* 12.8* 11.3*  NEUTROABS 7.4  --  19.1* 11.7* 9.4*  HGB 10.9* 12.2 10.0* 9.3* 8.8*  HCT 39.2 36.0 35.4* 32.7* 30.4*  MCV 93.1  --  92.4 91.3 88.6  PLT 154  --  164 124* 152   Cardiac Enzymes: No results for input(s): CKTOTAL, CKMB, CKMBINDEX, TROPONINI in the last 168 hours. BNP: Invalid input(s): POCBNP CBG: No results for input(s): GLUCAP in the last 168 hours. D-Dimer No results for input(s): DDIMER in the last 72 hours. Hgb A1c No results for input(s): HGBA1C in the last 72 hours. Lipid Profile No results for input(s): CHOL, HDL, LDLCALC, TRIG, CHOLHDL, LDLDIRECT in the last 72 hours. Thyroid function studies No results for input(s): TSH, T4TOTAL, T3FREE, THYROIDAB in the last 72 hours.  Invalid input(s): FREET3 Anemia work up No results for input(s): VITAMINB12, FOLATE, FERRITIN, TIBC, IRON, RETICCTPCT in the last 72 hours. Microbiology Recent Results (from the past 240 hour(s))  Blood culture (routine x 2)     Status: None (Preliminary result)   Collection Time: 02/12/20  7:45 AM   Specimen: BLOOD  Result Value Ref Range Status   Specimen Description BLOOD LEFT  ANTECUBITAL  Final   Special Requests   Final    BOTTLES DRAWN AEROBIC AND ANAEROBIC Blood Culture results may not be optimal due to an inadequate volume of blood received in culture bottles   Culture   Final    NO GROWTH 3 DAYS Performed at [facility], [place] [address], [place], [place] [address]    Report Status PENDING  Incomplete  SARS Coronavirus 2 by RT PCR     Status: None   Collection Time: 02/12/20  8:09 AM  Result Value Ref Range Status   SARS Coronavirus 2 NEGATIVE NEGATIVE Final    Comment: (NOTE) Result indicates the ABSENCE of SARS-CoV-2 RNA in the patient specimen.   The lowest concentration of SARS-CoV-2 viral copies this assay can detect in nasopharyngeal swab specimens is 500 copies / mL.  A negative result does not preclude SARS-CoV-2 infection and should not be used as the sole basis for patient management decisions. A negative result may occur with improper specimen collection / handling, submission of a specimen other than nasopharyngeal swab, presence of viral mutation(s) within the areas targeted by this assay, and inadequate number of viral  copies (<500 copies / mL) present.  Negative results must be combined with clinical observations, patient history, and epidemiological information.   The expected result is NEGATIVE.   Patient Fact Sheet:  BlogSelections.co.uk    Provider Fact Sheet:  https://lucas.com/    This test is not yet approved or cleared by the [place] FDA and  has been [place] orized for detection and/or diagnosis of SARS-CoV-2 by FDA under an Emergency Use Authorization (EUA).  This EUA will remain in effect (meaning this test can be used) for the duration of  the COVID-19 declaration under Section 564(b)(1) of the Act, 21 U.S.C. section 360bbb-3(b)(1), unless the authorization is terminated or revoked sooner  Performed at [facility], [place] [address] [place],  [place] [address]   Blood culture (routine x 2)     Status: None (Preliminary result)   Collection Time: 02/12/20  8:25 AM   Specimen: BLOOD  Result Value Ref Range Status   Specimen Description BLOOD RIGHT ANTECUBITAL  Final   Special Requests   Final    BOTTLES DRAWN AEROBIC AND ANAEROBIC Blood Culture adequate volume   Culture   Final    NO GROWTH 3 DAYS Performed at [facility], [place] [address], [place], [place] [address]    Report Status PENDING  Incomplete  MRSA PCR Screening     Status: None   Collection Time: 02/13/20  2:00 PM   Specimen: Nasal Mucosa; Nasopharyngeal  Result Value Ref Range Status   MRSA by PCR NEGATIVE NEGATIVE Final    Comment:        The GeneXpert MRSA Assay (FDA approved for NASAL specimens only), is one component of a comprehensive MRSA colonization surveillance program. It is not intended to diagnose MRSA infection nor to guide or monitor treatment for MRSA infections. Performed at [facility], [place] [address] [place], [place] [address]      Signed: Terrilee Croak  [facility] 02/16/2020, 10:25 AM

## 2020-02-16 NOTE — Telephone Encounter (Signed)
Patient d/c'd this afternoon from the hospital.  Called and left a vm to return our call to schedule her a 1-2 week f/u appointment with one of our NPs.  Advised to call back to schedule.

## 2020-02-17 LAB — CULTURE, BLOOD (ROUTINE X 2)
Culture: NO GROWTH
Culture: NO GROWTH
Special Requests: ADEQUATE

## 2020-02-17 NOTE — Telephone Encounter (Signed)
Hospital follow up appointment made with NP in two weeks.

## 2020-02-20 ENCOUNTER — Telehealth: Payer: Self-pay

## 2020-02-20 DIAGNOSIS — D509 Iron deficiency anemia, unspecified: Secondary | ICD-10-CM | POA: Diagnosis not present

## 2020-02-20 DIAGNOSIS — I110 Hypertensive heart disease with heart failure: Secondary | ICD-10-CM | POA: Diagnosis not present

## 2020-02-20 DIAGNOSIS — I700 Atherosclerosis of aorta: Secondary | ICD-10-CM | POA: Diagnosis not present

## 2020-02-20 DIAGNOSIS — F0390 Unspecified dementia without behavioral disturbance: Secondary | ICD-10-CM | POA: Diagnosis not present

## 2020-02-20 DIAGNOSIS — I2510 Atherosclerotic heart disease of native coronary artery without angina pectoris: Secondary | ICD-10-CM | POA: Diagnosis not present

## 2020-02-20 DIAGNOSIS — R7303 Prediabetes: Secondary | ICD-10-CM | POA: Diagnosis not present

## 2020-02-20 DIAGNOSIS — E785 Hyperlipidemia, unspecified: Secondary | ICD-10-CM | POA: Diagnosis not present

## 2020-02-20 DIAGNOSIS — K219 Gastro-esophageal reflux disease without esophagitis: Secondary | ICD-10-CM | POA: Diagnosis not present

## 2020-02-20 DIAGNOSIS — J9622 Acute and chronic respiratory failure with hypercapnia: Secondary | ICD-10-CM | POA: Diagnosis not present

## 2020-02-20 DIAGNOSIS — C3431 Malignant neoplasm of lower lobe, right bronchus or lung: Secondary | ICD-10-CM | POA: Diagnosis not present

## 2020-02-20 DIAGNOSIS — I509 Heart failure, unspecified: Secondary | ICD-10-CM | POA: Diagnosis not present

## 2020-02-20 DIAGNOSIS — F329 Major depressive disorder, single episode, unspecified: Secondary | ICD-10-CM | POA: Diagnosis not present

## 2020-02-20 DIAGNOSIS — E039 Hypothyroidism, unspecified: Secondary | ICD-10-CM | POA: Diagnosis not present

## 2020-02-20 DIAGNOSIS — K449 Diaphragmatic hernia without obstruction or gangrene: Secondary | ICD-10-CM | POA: Diagnosis not present

## 2020-02-20 DIAGNOSIS — F419 Anxiety disorder, unspecified: Secondary | ICD-10-CM | POA: Diagnosis not present

## 2020-02-20 DIAGNOSIS — J9621 Acute and chronic respiratory failure with hypoxia: Secondary | ICD-10-CM | POA: Diagnosis not present

## 2020-02-20 DIAGNOSIS — K2970 Gastritis, unspecified, without bleeding: Secondary | ICD-10-CM | POA: Diagnosis not present

## 2020-02-20 DIAGNOSIS — G9341 Metabolic encephalopathy: Secondary | ICD-10-CM | POA: Diagnosis not present

## 2020-02-20 DIAGNOSIS — I252 Old myocardial infarction: Secondary | ICD-10-CM | POA: Diagnosis not present

## 2020-02-20 DIAGNOSIS — J439 Emphysema, unspecified: Secondary | ICD-10-CM | POA: Diagnosis not present

## 2020-02-20 DIAGNOSIS — K5790 Diverticulosis of intestine, part unspecified, without perforation or abscess without bleeding: Secondary | ICD-10-CM | POA: Diagnosis not present

## 2020-02-20 DIAGNOSIS — D630 Anemia in neoplastic disease: Secondary | ICD-10-CM | POA: Diagnosis not present

## 2020-02-20 DIAGNOSIS — I951 Orthostatic hypotension: Secondary | ICD-10-CM | POA: Diagnosis not present

## 2020-02-20 DIAGNOSIS — I480 Paroxysmal atrial fibrillation: Secondary | ICD-10-CM | POA: Diagnosis not present

## 2020-02-20 DIAGNOSIS — M47816 Spondylosis without myelopathy or radiculopathy, lumbar region: Secondary | ICD-10-CM | POA: Diagnosis not present

## 2020-02-20 NOTE — Telephone Encounter (Signed)
New message   [facility] calling for verbal order -skilled nursing one week x 8 weeks

## 2020-02-20 NOTE — Telephone Encounter (Signed)
Verbal okay for nursing orders as requested.

## 2020-02-20 NOTE — Telephone Encounter (Signed)
Error no note needed °

## 2020-02-23 ENCOUNTER — Ambulatory Visit (INDEPENDENT_AMBULATORY_CARE_PROVIDER_SITE_OTHER): Payer: PPO | Admitting: *Deleted

## 2020-02-23 DIAGNOSIS — I441 Atrioventricular block, second degree: Secondary | ICD-10-CM | POA: Diagnosis not present

## 2020-02-25 LAB — CUP PACEART REMOTE DEVICE CHECK
Battery Remaining Longevity: 127 mo
Battery Remaining Percentage: 95.5 %
Battery Voltage: 3.02 V
Brady Statistic AP VP Percent: 10 %
Brady Statistic AP VS Percent: 6 %
Brady Statistic AS VP Percent: 56 %
Brady Statistic AS VS Percent: 28 %
Brady Statistic RA Percent Paced: 15 %
Brady Statistic RV Percent Paced: 65 %
Date Time Interrogation Session: 20210809020843
Implantable Lead Implant Date: 20210204
Implantable Lead Implant Date: 20210204
Implantable Lead Location: 753859
Implantable Lead Location: 753860
Implantable Pulse Generator Implant Date: 20210204
Lead Channel Impedance Value: 410 Ohm
Lead Channel Impedance Value: 560 Ohm
Lead Channel Pacing Threshold Amplitude: 0.5 V
Lead Channel Pacing Threshold Amplitude: 0.75 V
Lead Channel Pacing Threshold Pulse Width: 0.4 ms
Lead Channel Pacing Threshold Pulse Width: 0.5 ms
Lead Channel Sensing Intrinsic Amplitude: 12 mV
Lead Channel Sensing Intrinsic Amplitude: 5 mV
Lead Channel Setting Pacing Amplitude: 1 V
Lead Channel Setting Pacing Amplitude: 1.5 V
Lead Channel Setting Pacing Pulse Width: 0.4 ms
Lead Channel Setting Sensing Sensitivity: 2.5 mV
Pulse Gen Model: 2272
Pulse Gen Serial Number: 9196859

## 2020-02-26 NOTE — Progress Notes (Signed)
Remote pacemaker transmission.   

## 2020-02-27 ENCOUNTER — Telehealth: Payer: Self-pay

## 2020-02-27 ENCOUNTER — Telehealth: Payer: Self-pay | Admitting: Internal Medicine

## 2020-02-27 ENCOUNTER — Other Ambulatory Visit: Payer: Self-pay | Admitting: Internal Medicine

## 2020-02-27 ENCOUNTER — Ambulatory Visit: Payer: PPO | Admitting: Family

## 2020-02-27 NOTE — Telephone Encounter (Signed)
[facility]  Nurse Assessment Nurse: Chestine Spore, RN, [place] Date/Time (Eastern Time): 02/27/2020 10:25:58 AM Confirm and document reason for call. If symptomatic, describe symptoms. ---caller states having pain in shoulder to elbow. left. pain at 5 (0-10 pain scale). no known injury 02/27/2020 10:33:05 AM See HCP within 4 Hours (or PCP triage) Yes Chestine Spore, RN, [place]

## 2020-02-27 NOTE — Telephone Encounter (Signed)
Done erx 

## 2020-02-27 NOTE — Telephone Encounter (Signed)
Patient daughter called stating that her left arm is hurting for a week or two and no chest pain. Daughter to rule out anything dealing with her heart. Transmission sent successfully. Please call daughter back at [phone]

## 2020-02-27 NOTE — Telephone Encounter (Signed)
Per [name] daughter [facility]), patient reports of left shoulder pain that radiates into left elbow, increased pain with movement, described as an ache. Denies chest pain, palpitations, shortness of breath or any other complaints. Unknown injury d/t patient memory per family. States patient has frequent hx of bumping into walls often d/t not using walker. Daughter reports patient was d/c from hospital 2 weeks ago. States patient was complaining of pain while in hospital but was never told anything regarding pain. Compliant with medications.   Ulla Potash that Currently patient is not currently in any irregular rhythm. Advised her that I do not see new findings on device, current findings patient has hx of. Advised I will forward this to Dr. Rayann Heman for any recommendations.   ED precautions given.  Has follow-up with A. Tillery, PA-C on 03/08/20.   Remote check 02/27/20   AT/AF burden 5.9%, appear AF w/ HVR 130-170 bpm.   Routing back to triage for further review.

## 2020-02-27 NOTE — Telephone Encounter (Signed)
Patient had appointment to be seen today and daughter cancelled and wanted to wait to see Dr. Quay Burow.

## 2020-03-02 NOTE — Progress Notes (Signed)
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 incentive spirometer   Right lower lobe pneumonia - Recurrent aspiration pneumonia, hospitalized end of July 2021. Treated with  IV cefepime, azithromycin and solu-medrol. Clinically she is doing better, no fevers. Lung sounds with scattered rales. O2 94% 2L.  - Modified barium swallow showed mild oropharyngeal dysphagia, delayed swallow initiation. Recommend regular soild/thin liquids, frequent use of throat clear to help remove any penetration from laryngeal vestibule. Pt will need 1:1 supervision  - Repeat CXR today to ensure resolution  - Given patient education on aspiration precautions    Martyn Ehrich, NP 03/03/2020

## 2020-03-03 ENCOUNTER — Encounter: Payer: Self-pay | Admitting: Internal Medicine

## 2020-03-03 ENCOUNTER — Other Ambulatory Visit: Payer: Self-pay

## 2020-03-03 ENCOUNTER — Ambulatory Visit (INDEPENDENT_AMBULATORY_CARE_PROVIDER_SITE_OTHER): Payer: PPO

## 2020-03-03 ENCOUNTER — Ambulatory Visit: Payer: PPO | Admitting: Primary Care

## 2020-03-03 VITALS — BP 118/68 | Temp 97.9°F | Ht 64.0 in | Wt 134.2 lb

## 2020-03-03 DIAGNOSIS — J690 Pneumonitis due to inhalation of food and vomit: Secondary | ICD-10-CM

## 2020-03-03 DIAGNOSIS — G8929 Other chronic pain: Secondary | ICD-10-CM

## 2020-03-03 DIAGNOSIS — M25512 Pain in left shoulder: Secondary | ICD-10-CM | POA: Diagnosis not present

## 2020-03-03 DIAGNOSIS — J9811 Atelectasis: Secondary | ICD-10-CM | POA: Diagnosis not present

## 2020-03-03 DIAGNOSIS — J439 Emphysema, unspecified: Secondary | ICD-10-CM | POA: Diagnosis not present

## 2020-03-03 NOTE — Progress Notes (Signed)
Subjective:    Patient ID: Brandi [facility], female    DOB: 03/24/1934, 86 y.o.   MRN: [id number]  HPI The patient is here for follow up from the hospital.  She is here with her daughter.   01/11/20 - 01/13/16- admitted for acute on chroni resp failure w/ hypoxia secondary to COPD exac and CAP.  Oxygen level increased and weaned to home 2 L.  Received abx, prednisone, atrovent.  pulm consulted.  D/c;d to home  7/29-8/2 - brought to ED after being found on floor by family.  She was confused, had fever of 101.2, tachycardic, tachypneic, hypoxic and required a NRB.  CXR showed RLL infiltrate vs scarring.  Received high flow oxygen - weaned to 3 L.. was given abx, steroids and bronchodilators. Acute metabolic encephalopathy related to hypercapnia, dec oral intake, dehydration.  She had coffee-ground emesis in ED.   SNF recommended, but she wanted to go home.    Follow up with GI as outpatient, pulm as outpatient, she has already seen pulmonary.    She has no nausea, gerd.  Some epigastric pain and occ black stool - once in a while.  She denies abdominal pain.   Chronic left upper back - raidates down to mid back, down left arm and left chest wall.  No fall or injury.  Movement makes it worse.  No N/t.  Some weaknes in arm.  Left left pain.  No left chest pain.    She feels her breathing is back to her baseline.   Medications and allergies reviewed with patient and updated if appropriate.  Patient Active Problem List   Diagnosis Date Noted  . PNA (pneumonia) 02/12/2020  . Right lower lobe pneumonia 01/13/2020  . CAP (community acquired pneumonia) 01/11/2020  . Acute respiratory failure ([place]) 09/02/2019  . Acute diastolic CHF (congestive heart failure) ([place]) 09/02/2019  . Paroxysmal atrial fibrillation (HCC)   . Second degree Mobitz II AV block 08/21/2019  . Second degree AV block 08/08/2019  . SOB (shortness of breath) 08/05/2019  . Chest tightness 08/05/2019  . GERD  (gastroesophageal reflux disease) 05/21/2019  . Chronic respiratory failure with hypoxia ([place]) 05/21/2019  . Medication management 05/21/2019  . Stage I squamous cell carcinoma of right lung ([place]) 02/06/2019  . Constipation 09/11/2018  . COPD exacerbation ([place]) 09/04/2018  . Right lower lobe lung mass 09/04/2018  . Closed compression fracture of L1 lumbar vertebra, initial encounter ([place]) 09/04/2018  . Preoperative clearance 07/19/2018  . Lower back pain 06/25/2018  . Cubital tunnel syndrome on left 05/22/2018  . Dupuytren's contracture of both hands 05/10/2018  . Primary osteoarthritis of both knees 05/10/2018  . Difficulty urinating 05/10/2018  . Osteoporosis 06/14/2017  . Prediabetes 06/23/2015  . Depression 06/23/2015  . Carotid stenosis 10/12/2014  . Sleep disorder 04/09/2014  . Dizziness 01/20/2013  . Mesenteric artery stenosis ([place]) 01/13/2013  . Thoracic aneurysm without mention of rupture 11/04/2012  . Chronic mesenteric ischemia ([place]) 10/08/2012  . Memory deficit 06/20/2012  . Irritable bowel syndrome 06/20/2012  . Ocular myasthenia gravis ([place]) 06/20/2012  . PVD (peripheral vascular disease) ([place]) 06/20/2012  . Hereditary and idiopathic peripheral neuropathy 05/22/2012  . Syncope 08/28/2011  . ABDOMINAL BRUIT 08/17/2009  . Coronary atherosclerosis 09/05/2008  . Hypothyroidism 05/26/2008  . VITAMIN D DEFICIENCY 05/26/2008  . CHOLELITHIASIS 12/06/2007  . DIVERTICULOSIS, COLON 12/05/2007  . HYPERLIPIDEMIA 08/19/2007  . COPD (chronic obstructive pulmonary disease) with emphysema ([place]) 08/19/2007  . CIGARETTE SMOKER 02/06/2007  . Essential  hypertension 02/06/2007  . COLONIC POLYPS 11/21/2004    Current Outpatient Medications on File Prior to Visit  Medication Sig Dispense Refill  . acetaminophen (TYLENOL) 500 MG tablet Take 1,000 mg by mouth every 6 (six) hours as needed for mild pain.     . Calcium-Magnesium-Vitamin D (CALCIUM 1200+D3 PO) Take 1 tablet by mouth at  bedtime.     . clonazePAM (KLONOPIN) 0.5 MG tablet TAKE 1 TABLET BY MOUTH EVERYDAY AT BEDTIME 30 tablet 0  . docusate sodium (COLACE) 100 MG capsule Take 100 mg by mouth at bedtime.     . donepezil (ARICEPT) 10 MG tablet TAKE 1 TABLET BY MOUTH EVERYDAY AT BEDTIME (Patient taking differently: Take 10 mg by mouth at bedtime. ) 90 tablet 3  . DULoxetine (CYMBALTA) 60 MG capsule TAKE 1 CAPSULE BY MOUTH EVERY DAY (Patient taking differently: Take 60 mg by mouth daily. ) 90 capsule 2  . ELIQUIS 2.5 MG TABS tablet TAKE 1 TABLET BY MOUTH TWICE A DAY (Patient taking differently: Take 2.5 mg by mouth 2 (two) times daily. ) 60 tablet 5  . furosemide (LASIX) 40 MG tablet Take 1 tablet (40 mg total) by mouth every other day. 45 tablet 3  . gabapentin (NEURONTIN) 300 MG capsule Take 1 capsule (300 mg total) by mouth 4 (four) times daily. (Patient taking differently: Take 300 mg by mouth 3 (three) times daily. ) 120 capsule 5  . ipratropium (ATROVENT) 0.02 % nebulizer solution Take 2.5 mLs (0.5 mg total) by nebulization every 6 (six) hours as needed for wheezing or shortness of breath. 75 mL 0  . levothyroxine (SYNTHROID) 125 MCG tablet TAKE 1 TABLET (125 MCG TOTAL) BY MOUTH DAILY BEFORE BREAKFAST. 90 tablet 1  . metoprolol tartrate (LOPRESSOR) 25 MG tablet Take 1 tablet (25 mg total) by mouth daily. 30 tablet 6  . Multiple Vitamins-Minerals (PRESERVISION AREDS 2 PO) Take 1 capsule by mouth 2 (two) times daily.    . pantoprazole (PROTONIX) 40 MG tablet Take 1 tablet (40 mg total) by mouth daily. 90 tablet 1  . potassium chloride (KLOR-CON) 10 MEQ tablet Take 1 tablet 10 meq every other day with Lasix. (Patient taking differently: Take 10 mEq by mouth every other day. Take with lasix.) 45 tablet 3  . pravastatin (PRAVACHOL) 40 MG tablet TAKE 1 TABLET BY MOUTH DAILY. FOLLOW-UP APPT W/LABS ARE DUE MUST SEE PROVIDER FOR FUTURE REFILLS (Patient taking differently: Take 40 mg by mouth at bedtime. ) 30 tablet 2  . PROLIA  60 MG/ML SOSY injection Inject 60 mg into the skin every 6 (six) months.     . umeclidinium-vilanterol (ANORO ELLIPTA) 62.5-25 MCG/INH AEPB INHALE 1 PUFF BY MOUTH EVERY DAY (Patient taking differently: Inhale 1 puff into the lungs daily. ) 60 each 5   No current facility-administered medications on file prior to visit.    Past Medical History:  Diagnosis Date  . Adenomatous colon polyp   . Arthritis   . CAD (coronary artery disease)    PTCA of RCA   . Carotid artery occlusion   . Cervical spine fracture ([place])   . COPD (chronic obstructive pulmonary disease) ([place])   . Depression   . Diverticulosis   . Gastritis 09/15/1991  . GERD (gastroesophageal reflux disease) 09/15/1991   Dr Sharlett Iles  . Hiatal hernia 09/15/1991  . Hip fracture, right ([place])   . HLD (hyperlipidemia)   . HTN (hypertension)   . Hyperplastic polyps of stomach 11/2007   colonoscopy  . Hypertension   .  Hypothyroidism    affecting the left eye, proptosis  . Iron deficiency anemia   . Lung cancer ([place])    s/p XRT  . Macular degeneration of left eye   . Memory loss   . Mesenteric artery stenosis ([place])   . Myocardial infarction ([place]) 1993  . Ocular myasthenia gravis ([place])    Dr Jannifer Franklin  . Orthostatic hypotension 06/05/2013  . PONV (postoperative nausea and vomiting)   . Strabismus    left eye  . Syncope 1998    Past Surgical History:  Procedure Laterality Date  . ABDOMINAL AORTAGRAM N/A 10/15/2012   Procedure: ABDOMINAL Maxcine Ham;  Surgeon: Serafina Mitchell, MD;  Location: [facility] CATH LAB;  Service: Cardiovascular;  Laterality: N/A;  . arm surgery Left    fx  . BALLOON ANGIOPLASTY, ARTERY  1993  . CARDIAC CATHETERIZATION  1996   LAD 20/50, CFX OK, RCA 30 at prev PTCA site, EF with mild HK inferior wall  . CAROTID ANGIOGRAM N/A 10/28/2014   Procedure: CAROTID ANGIOGRAM;  Surgeon: Serafina Mitchell, MD;  Location: [facility] CATH LAB;  Service: Cardiovascular;  Laterality: N/A;  . CAROTID ENDARTERECTOMY    . CATARACT EXTRACTION      bilateral  . COLONOSCOPY W/ POLYPECTOMY  2006   Adenomatous polyps  . ENDARTERECTOMY Left 01/14/2015   Procedure: LEFT CAROTID ENDARTERECTOMY ;  Surgeon: Serafina Mitchell, MD;  Location: [place];  Service: Vascular;  Laterality: Left;  . ENDARTERECTOMY Right 08/12/2015   Procedure: ENDARTERECTOMY CAROTID WITH PATCH ANGIOPLASTY;  Surgeon: Serafina Mitchell, MD;  Location: [place];  Service: Vascular;  Laterality: Right;  . EYE MUSCLE SURGERY Left 11/04/2015  . EYE SURGERY Bilateral May 2016   Eyelids  . FOOT SURGERY Left   . LEG SURGERY Left    laceration  . MIDDLE EAR SURGERY Left 1970  . PACEMAKER IMPLANT N/A 08/21/2019   Procedure: PACEMAKER IMPLANT;  Surgeon: [name], MD;  Location: [place] CV LAB;  Service: Cardiovascular;  Laterality: N/A;  . PERCUTANEOUS STENT INTERVENTION  12/03/2012   Procedure: PERCUTANEOUS STENT INTERVENTION;  Surgeon: Serafina Mitchell, MD;  Location[facility] CATH LAB;  Service: Cardiovascular;;  sma stent x1  . PERIPHERAL VASCULAR BALLOON ANGIOPLASTY  07/22/2019   Procedure: PERIPHERAL VASCULAR BALLOON ANGIOPLASTY;  Surgeon: Serafina Mitchell, MD;  Location: [place] CV LAB;  Service: Cardiovascular;;  Superior mesenteric  . STRABISMUS SURGERY Left 10/28/2015   Procedure: REPAIR STRABISMUS LEFT EYE;  Surgeon: Lamonte Sakai, MD;  Location: [place];  Service: Ophthalmology;  Laterality: Left;  . Third-degree Janaiya [place]  2003   [facility]-legs ,buttocks,arms  . TOTAL ABDOMINAL HYSTERECTOMY  1973   Dysfunctional menses  . UPPER GI ENDOSCOPY      Dr Sharlett Iles  . VISCERAL ANGIOGRAM N/A 10/15/2012   Procedure: VISCERAL ANGIOGRAM;  Surgeon: Serafina Mitchell, MD;  Location: [facility] CATH LAB;  Service: Cardiovascular;  Laterality: N/A;  . VISCERAL ANGIOGRAM N/A 12/03/2012   Procedure: VISCERAL ANGIOGRAM;  Surgeon: Serafina Mitchell, MD;  Location: [facility] CATH LAB;  Service: Cardiovascular;  Laterality: N/A;  . VISCERAL ANGIOGRAM N/A 08/05/2013   Procedure: MESENTERIC ANGIOGRAM;  Surgeon:  Serafina Mitchell, MD;  Location: [facility] CATH LAB;  Service: Cardiovascular;  Laterality: N/A;  . VISCERAL ANGIOGRAM N/A 10/28/2014   Procedure: VISCERAL ANGIOGRAM;  Surgeon: Serafina Mitchell, MD;  Location: [facility] CATH LAB;  Service: Cardiovascular;  Laterality: N/A;  . VISCERAL ANGIOGRAPHY N/A 07/22/2019   Procedure: MESENTERIC ANGIOGRAPHY;  Surgeon: Serafina Mitchell,  MD;  Location: [facility] CV LAB;  Service: Cardiovascular;  Laterality: N/A;    Social History   Socioeconomic History  . Marital status: Married    Spouse name: Not on file  . Number of children: 3  . Years of education: 9th  . Highest education level: Not on file  Occupational History  . Occupation: Retired  Tobacco Use  . Smoking status: Former Smoker    Packs/day: 1.00    Years: 60.00    Pack years: 60.00    Types: Cigarettes    Quit date: 07/2019    Years since quitting: 0.6  . Smokeless tobacco: Never Used  . Tobacco comment: Successfully quit  Vaping Use  . Vaping Use: Never used  Substance and Sexual Activity  . Alcohol use: No    Alcohol/week: 0.0 standard drinks  . Drug use: No  . Sexual activity: Never  Other Topics Concern  . Not on file  Social History Narrative   Patient is right handed.  Lives in [place] with husband.   Patient drinks 3-4 cups of caffeine daily.   Social Determinants of Health   Financial Resource Strain:   . Difficulty of Paying Living Expenses: Not on file  Food Insecurity:   . Worried About Charity fundraiser in the Last Year: Not on file  . Ran Out of Food in the Last Year: Not on file  Transportation Needs:   . Lack of Transportation (Medical): Not on file  . Lack of Transportation (Non-Medical): Not on file  Physical Activity:   . Days of Exercise per Week: Not on file  . Minutes of Exercise per Session: Not on file  Stress:   . Feeling of Stress : Not on file  Social Connections:   . Frequency of Communication with Friends and Family: Not on file  . Frequency of  Social Gatherings with Friends and Family: Not on file  . Attends Religious Services: Not on file  . Active Member of Clubs or Organizations: Not on file  . Attends Archivist Meetings: Not on file  . Marital Status: Not on file    Family History  Problem Relation Age of Onset  . Throat cancer Mother        ? thyroid cancer  . Cancer Mother   . Emphysema Father   . Diabetes Father   . Heart attack Father 30  . Colon cancer Brother   . Cerebral aneurysm Brother   . Hypothyroidism Sister        X86  . Cancer Brother        Ear  . Diabetes Paternal Grandmother   . Diabetes Paternal Grandfather   . Diabetes Maternal Aunt     Review of Systems  Constitutional: Negative for fever.  Respiratory: Positive for cough (occ) and shortness of breath. Negative for wheezing.   Cardiovascular: Negative for chest pain, palpitations and leg swelling.  Gastrointestinal: Negative for abdominal pain and nausea.  Musculoskeletal: Positive for back pain (left upper back and down arm) and neck pain.  Neurological: Positive for light-headedness and headaches (occ).       Objective:   Vitals:   03/04/20 1104  BP: 114/60  Pulse: 70  Resp: 16  Temp: 98 F (36.7 C)   BP Readings from Last 3 Encounters:  03/04/20 114/60  03/03/20 118/68  02/16/20 (!) 143/95   Wt Readings from Last 3 Encounters:  03/04/20 133 lb (60.3 kg)  03/03/20 134 lb 3.2 oz (60.9  kg)  02/12/20 136 lb 3.2 oz (61.8 kg)   Body mass index is 22.83 kg/m.   Physical Exam    Constitutional: Appears well-developed and well-nourished. No distress.  HENT:  Head: Normocephalic and atraumatic.  Neck: Neck supple. No tracheal deviation present. No thyromegaly present.  No cervical lymphadenopathy Cardiovascular: Normal rate, regular rhythm and normal heart sounds.   No murmur heard.  No edema Pulmonary/Chest: Effort normal and breath sounds normal. No respiratory distress. No has no wheezes.  Bilateral dry  crackles-worse at the bases. Musculoskeletal: No posterior neck pain with palpation.  Left upper back tenderness with palpation Skin: Skin is warm and dry. Not diaphoretic.  Psychiatric: Normal mood and affect. Behavior is normal.      Assessment & Plan:    See Problem List for Assessment and Plan of chronic medical problems.    This visit occurred during the SARS-CoV-2 public health emergency.  Safety protocols were in place, including screening questions prior to the visit, additional usage of staff PPE, and extensive cleaning of exam room while observing appropriate contact time as indicated for disinfecting solutions.

## 2020-03-03 NOTE — Patient Instructions (Addendum)
Nice seeing you today Brandi [facility]  COPD: Continue Anoro 1 puff daily Please help patient apply for patient assistance  Use flutter valve and IS x3/day  Aspiration risk: Slow pace Small sips/bites Sit upright 90 degrees; remain semi-upright after meals Regular solids, thin liquids 1:1 Supervision recommended Encourage frequent throat clearing when eating  Orders: CXR re: follow-up aspiration pneumonia Left shoulder xray re: chronic left shoulder pain   Follow-up: 3 months with Dr. Vaughan Browner    Aspiration Precautions, Adult Aspiration is the breathing in (inhalation) of a liquid or object into the lungs. Things that can be inhaled into the lungs include:  Food.  Any type of liquid, such as drinks or saliva.  Stomach contents, such as vomit or stomach acid. What are the signs of aspiration? Signs of aspiration include:  Coughing after swallowing food or liquids.  Clearing the throat often while eating.  Trouble breathing. This may include: ? Breathing quickly. ? Breathing very slowly. ? Loud breathing. ? Rumbling sounds from the lungs while breathing.  Coughing up phlegm (sputum) that: ? Is yellow, tan, or green. ? Has pieces of food in it. ? Is bad-smelling.  Having a hoarse, barky cough.  Not being able to speak.  A hoarse voice.  Drooling while eating.  A feeling of fullness in the throat or a feeling that something is stuck in the throat.  Choking often.  Having a runny noise while eating.  Coughing when lying down or having to sit up quickly after lying down.  A change in skin color. The skin may look red or blue.  Fever.  Watery eyes.  Pain in the chest or back.  A pained look on the face. What are the complications of aspiration? Complications of aspiration include:  Losing weight because the person is not absorbing needed nutrients.  Loss of enjoyment and the social benefits of eating.  Choking.  Lung irritation, if someone aspirates  acidic food or drinks.  Lung infection (pneumonia).  Collection of infected liquid (pus) in the lungs (lung abscess). In serious cases, death can occur. What can I do to prevent aspiration? Caring for someone who has a feeding tube If you are caring for someone who has a feeding tube who cannot eat or drink safely through his or her mouth:  Keep the person in an upright position as much as possible.  Do not lay the person flat if he or she is getting continuous feedings. If you need to lay the person flat for any reason, turn the feeding pump off.  Check feeding tube residuals as told by your health care provider. Ask your health care provider what residual amount is too high. Caring for someone who can eat and drink safely by mouth If you are caring for someone who can eat and drink safely through his or her mouth:  Have the person sit in an upright position when eating food or drinking fluids. This can be done in two ways: ? Have the person sit up in a chair. ? If sitting in a chair is not possible, position the person in bed so he or she is upright.  Remind the person to eat slowly and chew well. Make sure the person is awake and alert while eating.  Do not distract the person. This is especially important for people with thinking or memory (cognitive) problems.  Allow foods to cool. Hot foods may be more difficult to swallow.  Provide small meals more frequently, instead of 3 large  meals. This may reduce fatigue during eating.  Check the person's mouth thoroughly for leftover food after eating.  Keep the person sitting upright for 30-45 minutes after eating.  Do not serve food or drink during 2 hours or more before bedtime. General instructions Follow these general guidelines to prevent aspiration in someone who can eat and drink safely by mouth:  Never put food or liquids in the mouth of a person who is not fully alert.  Feed small amounts of food. Do not force  feed.  For a person who is on a diet for swallowing difficulty (dysphagia diet), follow the recommended food and drink consistency. For example, in dysphagia diet level 1, thicken liquids to pudding-like consistency.  Use as little water as possible when brushing the person's teeth or cleaning his or her mouth.  Provide oral care before and after meals.  Use adaptive devices such as cut-out cups, straws, or utensils as told by the health care provider.  Crush pills and put them in soft food such as pudding or ice cream. Some pills should not be crushed. Check with the health care provider before crushing any medicine. Contact a health care provider if:  The person has a feeding tube, and the feeding tube residual amount is too high.  The person has a fever.  The person tries to avoid food or water, such as refusing to eat, drink, or be fed, or is eating less than normal.  The person may have aspirated food or liquid.  You notice warning signs, such as choking or coughing, when the person eats or drinks. Get help right away if:  The person has trouble breathing or starts to breathe quickly.  The person is breathing very slowly or stops breathing.  The person coughs a lot after eating or drinking.  The person has a long-lasting (chronic) cough.  The person coughs up thick, yellow, or tan sputum.  If someone is choking on food or an object, perform the Heimlich maneuver (abdominal thrusts).  The person has symptoms of pneumonia, such as: ? Coughing a lot. ? Coughing up mucus with a bad smell or blood in it. ? Feeling short of breath. ? Complaining of chest pain. ? Sweating, fever, and chills. ? Feeling tired. ? Complaining of trouble breathing. ? Wheezing.  The person cannot stop choking.  The person is unable to breathe, turns blue, faints, or seems confused. These symptoms may represent a serious problem that is an emergency. Do not wait to see if the symptoms will go  away. Get medical help right away. Call your local emergency services (911 in the [place]  Summary  Aspiration is the breathing in (inhalation) of a liquid or object into the lungs. Things that can be inhaled into the lungs include food, liquids, saliva, or stomach contents.  Aspiration can cause pneumonia or choking.  One sign of aspiration is coughing after swallowing food or liquids.  Contact a health care provider if you notice signs of aspiration. This information is not intended to replace advice given to you by your health care provider. Make sure you discuss any questions you have with your health care provider. Document Revised: 06/15/2017 Document Reviewed: 03/30/2016 Elsevier Patient Education  [place].

## 2020-03-03 NOTE — Assessment & Plan Note (Addendum)
-   Recurrent aspiration pneumonia, hospitalized end of July 2021. Treated with  IV cefepime, azithromycin and solu-medrol. Clinically she is doing better, no fevers. Lung sounds with scattered rales. O2 94% 2L.  - Modified barium swallow showed mild oropharyngeal dysphagia, delayed swallow initiation. Recommend regular soild/thin liquids, frequent use of throat clear to help remove any penetration from laryngeal vestibule. Pt will need 1:1 supervision  - Repeat CXR today to ensure resolution  - Given patient education on aspiration precautions

## 2020-03-03 NOTE — Assessment & Plan Note (Signed)
-   Continue Anoro Ellipta 1 puff daily - Patient set up apt with pharmacy to help with patient assistance  - Encourage daily use of flutter valve and incentive spirometer

## 2020-03-04 ENCOUNTER — Other Ambulatory Visit: Payer: Self-pay

## 2020-03-04 ENCOUNTER — Other Ambulatory Visit: Payer: Self-pay | Admitting: Student

## 2020-03-04 ENCOUNTER — Ambulatory Visit (INDEPENDENT_AMBULATORY_CARE_PROVIDER_SITE_OTHER): Payer: PPO | Admitting: Internal Medicine

## 2020-03-04 ENCOUNTER — Encounter: Payer: Self-pay | Admitting: Internal Medicine

## 2020-03-04 VITALS — BP 114/60 | HR 70 | Temp 98.0°F | Resp 16 | Ht 64.0 in | Wt 133.0 lb

## 2020-03-04 DIAGNOSIS — F3289 Other specified depressive episodes: Secondary | ICD-10-CM

## 2020-03-04 DIAGNOSIS — G479 Sleep disorder, unspecified: Secondary | ICD-10-CM

## 2020-03-04 DIAGNOSIS — I10 Essential (primary) hypertension: Secondary | ICD-10-CM | POA: Diagnosis not present

## 2020-03-04 DIAGNOSIS — J9602 Acute respiratory failure with hypercapnia: Secondary | ICD-10-CM

## 2020-03-04 DIAGNOSIS — J9601 Acute respiratory failure with hypoxia: Secondary | ICD-10-CM

## 2020-03-04 DIAGNOSIS — D649 Anemia, unspecified: Secondary | ICD-10-CM

## 2020-03-04 DIAGNOSIS — K2971 Gastritis, unspecified, with bleeding: Secondary | ICD-10-CM

## 2020-03-04 LAB — COMPLETE METABOLIC PANEL WITH GFR
AG Ratio: 1.3 (calc) (ref 1.0–2.5)
ALT: 9 U/L (ref 6–29)
AST: 18 U/L (ref 10–35)
Albumin: 3.5 g/dL — ABNORMAL LOW (ref 3.6–5.1)
Alkaline phosphatase (APISO): 66 U/L (ref 37–153)
BUN: 13 mg/dL (ref 7–25)
CO2: 31 mmol/L (ref 20–32)
Calcium: 8.7 mg/dL (ref 8.6–10.4)
Chloride: 102 mmol/L (ref 98–110)
Creat: 0.88 mg/dL (ref 0.60–0.88)
GFR, Est African American: 70 mL/min/{1.73_m2} (ref >=60)
GFR, Est Non African American: 60 mL/min/{1.73_m2} (ref >=60)
Globulin: 2.6 g/dL (calc) (ref 1.9–3.7)
Glucose, Bld: 83 mg/dL (ref 65–99)
Potassium: 4.6 mmol/L (ref 3.5–5.3)
Sodium: 143 mmol/L (ref 135–146)
Total Bilirubin: 0.4 mg/dL (ref 0.2–1.2)
Total Protein: 6.1 g/dL (ref 6.1–8.1)

## 2020-03-04 LAB — CBC WITH DIFFERENTIAL/PLATELET
Absolute Monocytes: 840 cells/uL (ref 200–950)
Basophils Absolute: 59 cells/uL (ref 0–200)
Basophils Relative: 0.7 %
Eosinophils Absolute: 202 cells/uL (ref 15–500)
Eosinophils Relative: 2.4 %
HCT: 34 % — ABNORMAL LOW (ref 35.0–45.0)
Hemoglobin: 10.5 g/dL — ABNORMAL LOW (ref 11.7–15.5)
Lymphs Abs: 1176 cells/uL (ref 850–3900)
MCH: 26.9 pg — ABNORMAL LOW (ref 27.0–33.0)
MCHC: 30.9 g/dL — ABNORMAL LOW (ref 32.0–36.0)
MCV: 87.2 fL (ref 80.0–100.0)
MPV: 10.8 fL (ref 7.5–12.5)
Monocytes Relative: 10 %
Neutro Abs: 6124 cells/uL (ref 1500–7800)
Neutrophils Relative %: 72.9 %
Platelets: 169 10*3/uL (ref 140–400)
RBC: 3.9 10*6/uL (ref 3.80–5.10)
RDW: 14 % (ref 11.0–15.0)
Total Lymphocyte: 14 %
WBC: 8.4 10*3/uL (ref 3.8–10.8)

## 2020-03-04 LAB — FERRITIN: Ferritin: 33 ng/mL (ref 16–288)

## 2020-03-04 LAB — IRON: Iron: 40 ug/dL — ABNORMAL LOW (ref 45–160)

## 2020-03-04 NOTE — Progress Notes (Signed)
Please let patient know CXR showed continued clearing of previous pneumonia. No new opacities. Nothing further at this time.

## 2020-03-04 NOTE — Assessment & Plan Note (Signed)
Chronic BP well controlled Current regimen effective and well tolerated Continue current medications at current doses cmp  

## 2020-03-04 NOTE — Assessment & Plan Note (Signed)
Acute Probable gastritis - has h/o of this Had episode of coffee ground emesis in ED Dec [place] prontonix to BID x 1 month then dec back to daily Cbc today, iron , ferritin Referred to GI - hopefully EGD will not be necessary given her respiratory status

## 2020-03-04 NOTE — Assessment & Plan Note (Signed)
Acute respiratory failure and chronic respiratory failure-required 2 hospitalizations in the past couple of months Has followed up with pulmonary Using inhaler/nebulizers and she feels her breathing is back to normal
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
## 2020-03-04 NOTE — Assessment & Plan Note (Signed)
Chronic Controlled, stable Continue clonazepam nightly

## 2020-03-04 NOTE — Assessment & Plan Note (Signed)
Acute H&H has decreased while she was in the hospital Concern for possible gastritis or gastric ulcer given history of gastritis and episode of coffee-ground emesis CBC, iron, ferritin today Increase pantoprazole to twice daily for 1 month then decrease back to once daily Refer to GI

## 2020-03-04 NOTE — Progress Notes (Signed)
Spoke with patient's daughter, Mailen Newborn, listed on PDR.  Provided CXR results per [facility].  She verbalized understanding.  Nothing further needed.

## 2020-03-04 NOTE — Patient Instructions (Addendum)
  Blood work was ordered.     Medications reviewed and updated.  Changes include :   Increase pantoprazole to twice daily for one month, then decrease back to once a day.      A referral was ordered for GI.        Someone from their office will call you to schedule an appointment.

## 2020-03-04 NOTE — Assessment & Plan Note (Signed)
Chronic Controlled, stable Continue current dose of medication Cymbalta 60 mg daily

## 2020-03-08 ENCOUNTER — Other Ambulatory Visit: Payer: Self-pay

## 2020-03-08 ENCOUNTER — Encounter: Payer: Self-pay | Admitting: Student

## 2020-03-08 ENCOUNTER — Ambulatory Visit (INDEPENDENT_AMBULATORY_CARE_PROVIDER_SITE_OTHER): Payer: PPO | Admitting: Student

## 2020-03-08 VITALS — BP 122/48 | HR 68 | Ht 64.0 in | Wt 133.0 lb

## 2020-03-08 DIAGNOSIS — R55 Syncope and collapse: Secondary | ICD-10-CM | POA: Diagnosis not present

## 2020-03-08 DIAGNOSIS — G9341 Metabolic encephalopathy: Secondary | ICD-10-CM

## 2020-03-08 DIAGNOSIS — I951 Orthostatic hypotension: Secondary | ICD-10-CM

## 2020-03-08 DIAGNOSIS — K449 Diaphragmatic hernia without obstruction or gangrene: Secondary | ICD-10-CM

## 2020-03-08 DIAGNOSIS — Z9981 Dependence on supplemental oxygen: Secondary | ICD-10-CM

## 2020-03-08 DIAGNOSIS — I5032 Chronic diastolic (congestive) heart failure: Secondary | ICD-10-CM

## 2020-03-08 DIAGNOSIS — I252 Old myocardial infarction: Secondary | ICD-10-CM | POA: Diagnosis not present

## 2020-03-08 DIAGNOSIS — Z9181 History of falling: Secondary | ICD-10-CM

## 2020-03-08 DIAGNOSIS — Z7901 Long term (current) use of anticoagulants: Secondary | ICD-10-CM

## 2020-03-08 DIAGNOSIS — K5790 Diverticulosis of intestine, part unspecified, without perforation or abscess without bleeding: Secondary | ICD-10-CM

## 2020-03-08 DIAGNOSIS — I509 Heart failure, unspecified: Secondary | ICD-10-CM

## 2020-03-08 DIAGNOSIS — F419 Anxiety disorder, unspecified: Secondary | ICD-10-CM

## 2020-03-08 DIAGNOSIS — I700 Atherosclerosis of aorta: Secondary | ICD-10-CM

## 2020-03-08 DIAGNOSIS — Z8701 Personal history of pneumonia (recurrent): Secondary | ICD-10-CM

## 2020-03-08 DIAGNOSIS — F329 Major depressive disorder, single episode, unspecified: Secondary | ICD-10-CM

## 2020-03-08 DIAGNOSIS — D630 Anemia in neoplastic disease: Secondary | ICD-10-CM

## 2020-03-08 DIAGNOSIS — I110 Hypertensive heart disease with heart failure: Secondary | ICD-10-CM

## 2020-03-08 DIAGNOSIS — E785 Hyperlipidemia, unspecified: Secondary | ICD-10-CM

## 2020-03-08 DIAGNOSIS — I441 Atrioventricular block, second degree: Secondary | ICD-10-CM | POA: Diagnosis not present

## 2020-03-08 DIAGNOSIS — I10 Essential (primary) hypertension: Secondary | ICD-10-CM

## 2020-03-08 DIAGNOSIS — F0390 Unspecified dementia without behavioral disturbance: Secondary | ICD-10-CM

## 2020-03-08 DIAGNOSIS — E039 Hypothyroidism, unspecified: Secondary | ICD-10-CM

## 2020-03-08 DIAGNOSIS — J439 Emphysema, unspecified: Secondary | ICD-10-CM | POA: Diagnosis not present

## 2020-03-08 DIAGNOSIS — C3431 Malignant neoplasm of lower lobe, right bronchus or lung: Secondary | ICD-10-CM

## 2020-03-08 DIAGNOSIS — J9622 Acute and chronic respiratory failure with hypercapnia: Secondary | ICD-10-CM

## 2020-03-08 DIAGNOSIS — I480 Paroxysmal atrial fibrillation: Secondary | ICD-10-CM | POA: Diagnosis not present

## 2020-03-08 DIAGNOSIS — D509 Iron deficiency anemia, unspecified: Secondary | ICD-10-CM

## 2020-03-08 DIAGNOSIS — K2970 Gastritis, unspecified, without bleeding: Secondary | ICD-10-CM

## 2020-03-08 DIAGNOSIS — Z87891 Personal history of nicotine dependence: Secondary | ICD-10-CM

## 2020-03-08 DIAGNOSIS — I2510 Atherosclerotic heart disease of native coronary artery without angina pectoris: Secondary | ICD-10-CM | POA: Diagnosis not present

## 2020-03-08 DIAGNOSIS — Z95810 Presence of automatic (implantable) cardiac defibrillator: Secondary | ICD-10-CM

## 2020-03-08 DIAGNOSIS — J9621 Acute and chronic respiratory failure with hypoxia: Secondary | ICD-10-CM

## 2020-03-08 DIAGNOSIS — K219 Gastro-esophageal reflux disease without esophagitis: Secondary | ICD-10-CM

## 2020-03-08 DIAGNOSIS — R7303 Prediabetes: Secondary | ICD-10-CM

## 2020-03-08 DIAGNOSIS — M47816 Spondylosis without myelopathy or radiculopathy, lumbar region: Secondary | ICD-10-CM

## 2020-03-08 LAB — CUP PACEART INCLINIC DEVICE CHECK
Battery Remaining Longevity: 130 mo
Battery Voltage: 3.02 V
Brady Statistic RA Percent Paced: 14 %
Brady Statistic RV Percent Paced: 65 %
Date Time Interrogation Session: 20210823110929
Implantable Lead Implant Date: 20210204
Implantable Lead Implant Date: 20210204
Implantable Lead Location: 753859
Implantable Lead Location: 753860
Implantable Pulse Generator Implant Date: 20210204
Lead Channel Impedance Value: 425 Ohm
Lead Channel Impedance Value: 587.5 Ohm
Lead Channel Pacing Threshold Amplitude: 0.625 V
Lead Channel Pacing Threshold Amplitude: 0.75 V
Lead Channel Pacing Threshold Pulse Width: 0.4 ms
Lead Channel Pacing Threshold Pulse Width: 0.5 ms
Lead Channel Sensing Intrinsic Amplitude: 12 mV
Lead Channel Sensing Intrinsic Amplitude: 4.8 mV
Lead Channel Setting Pacing Amplitude: 1 V
Lead Channel Setting Pacing Amplitude: 1.625
Lead Channel Setting Pacing Pulse Width: 0.4 ms
Lead Channel Setting Sensing Sensitivity: 2.5 mV
Pulse Gen Model: 2272
Pulse Gen Serial Number: 9196859

## 2020-03-08 NOTE — Patient Instructions (Signed)
Medication Instructions:  *If you need a refill on your cardiac medications before your next appointment, please call your pharmacy*  Lab Work: If you have labs (blood work) drawn today and your tests are completely normal, you will receive your results only by: Marland Kitchen MyChart Message (if you have MyChart) OR . A paper copy in the mail If you have any lab test that is abnormal or we need to change your treatment, we will call you to review the results.  Follow-Up: At [facility], you and your health needs are our priority.  As part of our continuing mission to provide you with exceptional heart care, we have created designated Provider Care Teams.  These Care Teams include your primary Cardiologist (physician) and Advanced Practice Providers (APPs -  Physician Assistants and Nurse Practitioners) who all work together to provide you with the care you need, when you need it.  We recommend signing up for the patient portal called "MyChart".  Sign up information is provided on this After Visit Summary.  MyChart is used to connect with patients for Virtual Visits (Telemedicine).  Patients are able to view lab/test results, encounter notes, upcoming appointments, etc.  Non-urgent messages can be sent to your provider as well.   To learn more about what you can do with MyChart, go to [email or web address].    Your next appointment:   Your physician wants you to follow-up in: 6 MONTHS with Oda Kilts, PA-C. You will receive a reminder letter in the mail two months in advance. If you don't receive a letter, please call our office to schedule the follow-up appointment.  Remote monitoring is used to monitor your Pacemaker from home. This monitoring reduces the number of office visits required to check your device to one time per year. It allows [place] to keep an eye on the functioning of your device to ensure it is working properly. You are scheduled for a device check from home on 05/24/20. You may send your  transmission at any time that day. If you have a wireless device, the transmission will be sent automatically. After your physician reviews your transmission, you will receive a postcard with your next transmission date.  The format for your next appointment:   In Person with Legrand Como "Jonni Sanger" Chalmers Cater, PA-C

## 2020-03-09 DIAGNOSIS — I951 Orthostatic hypotension: Secondary | ICD-10-CM | POA: Diagnosis not present

## 2020-03-09 DIAGNOSIS — K5790 Diverticulosis of intestine, part unspecified, without perforation or abscess without bleeding: Secondary | ICD-10-CM | POA: Diagnosis not present

## 2020-03-09 DIAGNOSIS — K449 Diaphragmatic hernia without obstruction or gangrene: Secondary | ICD-10-CM | POA: Diagnosis not present

## 2020-03-09 DIAGNOSIS — C3431 Malignant neoplasm of lower lobe, right bronchus or lung: Secondary | ICD-10-CM | POA: Diagnosis not present

## 2020-03-09 DIAGNOSIS — E039 Hypothyroidism, unspecified: Secondary | ICD-10-CM | POA: Diagnosis not present

## 2020-03-09 DIAGNOSIS — I509 Heart failure, unspecified: Secondary | ICD-10-CM | POA: Diagnosis not present

## 2020-03-09 DIAGNOSIS — K219 Gastro-esophageal reflux disease without esophagitis: Secondary | ICD-10-CM | POA: Diagnosis not present

## 2020-03-09 DIAGNOSIS — D509 Iron deficiency anemia, unspecified: Secondary | ICD-10-CM | POA: Diagnosis not present

## 2020-03-09 DIAGNOSIS — F329 Major depressive disorder, single episode, unspecified: Secondary | ICD-10-CM | POA: Diagnosis not present

## 2020-03-09 DIAGNOSIS — I480 Paroxysmal atrial fibrillation: Secondary | ICD-10-CM | POA: Diagnosis not present

## 2020-03-09 DIAGNOSIS — F0390 Unspecified dementia without behavioral disturbance: Secondary | ICD-10-CM | POA: Diagnosis not present

## 2020-03-09 DIAGNOSIS — F419 Anxiety disorder, unspecified: Secondary | ICD-10-CM | POA: Diagnosis not present

## 2020-03-09 DIAGNOSIS — J439 Emphysema, unspecified: Secondary | ICD-10-CM | POA: Diagnosis not present

## 2020-03-09 DIAGNOSIS — I110 Hypertensive heart disease with heart failure: Secondary | ICD-10-CM | POA: Diagnosis not present

## 2020-03-09 DIAGNOSIS — K2970 Gastritis, unspecified, without bleeding: Secondary | ICD-10-CM | POA: Diagnosis not present

## 2020-03-09 DIAGNOSIS — J9621 Acute and chronic respiratory failure with hypoxia: Secondary | ICD-10-CM | POA: Diagnosis not present

## 2020-03-09 DIAGNOSIS — E785 Hyperlipidemia, unspecified: Secondary | ICD-10-CM | POA: Diagnosis not present

## 2020-03-09 DIAGNOSIS — D630 Anemia in neoplastic disease: Secondary | ICD-10-CM | POA: Diagnosis not present

## 2020-03-09 DIAGNOSIS — I252 Old myocardial infarction: Secondary | ICD-10-CM | POA: Diagnosis not present

## 2020-03-09 DIAGNOSIS — G9341 Metabolic encephalopathy: Secondary | ICD-10-CM | POA: Diagnosis not present

## 2020-03-09 DIAGNOSIS — I2510 Atherosclerotic heart disease of native coronary artery without angina pectoris: Secondary | ICD-10-CM | POA: Diagnosis not present

## 2020-03-09 DIAGNOSIS — I700 Atherosclerosis of aorta: Secondary | ICD-10-CM | POA: Diagnosis not present

## 2020-03-09 DIAGNOSIS — J9622 Acute and chronic respiratory failure with hypercapnia: Secondary | ICD-10-CM | POA: Diagnosis not present

## 2020-03-09 DIAGNOSIS — R7303 Prediabetes: Secondary | ICD-10-CM | POA: Diagnosis not present

## 2020-03-09 DIAGNOSIS — M47816 Spondylosis without myelopathy or radiculopathy, lumbar region: Secondary | ICD-10-CM | POA: Diagnosis not present

## 2020-03-10 ENCOUNTER — Other Ambulatory Visit: Payer: PPO

## 2020-03-10 DIAGNOSIS — J441 Chronic obstructive pulmonary disease with (acute) exacerbation: Secondary | ICD-10-CM | POA: Diagnosis not present

## 2020-03-14 DIAGNOSIS — I5031 Acute diastolic (congestive) heart failure: Secondary | ICD-10-CM | POA: Diagnosis not present

## 2020-03-14 DIAGNOSIS — J9611 Chronic respiratory failure with hypoxia: Secondary | ICD-10-CM | POA: Diagnosis not present

## 2020-03-14 DIAGNOSIS — C3491 Malignant neoplasm of unspecified part of right bronchus or lung: Secondary | ICD-10-CM | POA: Diagnosis not present

## 2020-03-14 DIAGNOSIS — J439 Emphysema, unspecified: Secondary | ICD-10-CM | POA: Diagnosis not present

## 2020-03-14 DIAGNOSIS — J441 Chronic obstructive pulmonary disease with (acute) exacerbation: Secondary | ICD-10-CM | POA: Diagnosis not present

## 2020-03-15 ENCOUNTER — Ambulatory Visit: Payer: PPO | Admitting: Pharmacist

## 2020-03-15 ENCOUNTER — Other Ambulatory Visit: Payer: Self-pay

## 2020-03-15 ENCOUNTER — Encounter: Payer: Self-pay | Admitting: Nurse Practitioner

## 2020-03-15 DIAGNOSIS — Z7189 Other specified counseling: Secondary | ICD-10-CM | POA: Insufficient documentation

## 2020-03-15 DIAGNOSIS — J439 Emphysema, unspecified: Secondary | ICD-10-CM

## 2020-03-15 NOTE — Patient Instructions (Signed)
Thank you for meeting with the pharmacy team today!  Below find a summary of what we discussed at your visit: . CONTINUE Anoro 1 puff daily . We will apply for [facility] patient assistance . START Bevespi 2 puffs twice a day with inhaler once approved. CALL [phone] for status updates for assistance.   Call [phone] with any questions or concerns.   Mariella Saa, PharmD, [place], CPP Clinical Specialty Pharmacist (Rheumatology and Pulmonology)  03/15/2020 2:29 PM

## 2020-03-15 NOTE — Progress Notes (Signed)
HPI Patient presents today to [facility] to see pharmacy team for [facility] patient assistance and referred by Geraldo Pitter, NP. Past medical history significant for COPD, stage 1 lung cancer, hypothyroidism, osteoporosis  HTN, CAD, PVD, CHF, Afib, depression, ocular myasthenia gravis, and GERD.  Reports previous cost of Anoro was $45 for a month supply, however copay is now expensive as it has increased to $106 per month due to being in the donut hole. Patient reports no difficulty using Anoro and feels like its effective.   Respiratory Medications Current: Anoro Ellipta 62.5-25 mcg daily, Atrovent 0.5 mg PRN Patient reports no known adherence challenges  OBJECTIVE Allergies  Allergen Reactions  . Silver Sulfadiazine Other (See Comments)    lowers white blood count     Outpatient Encounter Medications as of 03/15/2020  Medication Sig  . acetaminophen (TYLENOL) 500 MG tablet Take 1,000 mg by mouth every 6 (six) hours as needed for mild pain.   . Calcium-Magnesium-Vitamin D (CALCIUM 1200+D3 PO) Take 1 tablet by mouth at bedtime.   . clonazePAM (KLONOPIN) 0.5 MG tablet TAKE 1 TABLET BY MOUTH EVERYDAY AT BEDTIME  . docusate sodium (COLACE) 100 MG capsule Take 100 mg by mouth at bedtime.   . donepezil (ARICEPT) 10 MG tablet TAKE 1 TABLET BY MOUTH EVERYDAY AT BEDTIME  . DULoxetine (CYMBALTA) 60 MG capsule TAKE 1 CAPSULE BY MOUTH EVERY DAY  . ELIQUIS 2.5 MG TABS tablet TAKE 1 TABLET BY MOUTH TWICE A DAY  . furosemide (LASIX) 40 MG tablet Take 1 tablet (40 mg total) by mouth every other day.  . gabapentin (NEURONTIN) 300 MG capsule Take 1 capsule (300 mg total) by mouth 4 (four) times daily.  Marland Kitchen ipratropium (ATROVENT) 0.02 % nebulizer solution Take 2.5 mLs (0.5 mg total) by nebulization every 6 (six) hours as needed for wheezing or shortness of breath.  . levothyroxine (SYNTHROID) 125 MCG tablet TAKE 1 TABLET (125 MCG TOTAL) BY MOUTH DAILY BEFORE BREAKFAST.  . metoprolol  tartrate (LOPRESSOR) 25 MG tablet TAKE 1 TABLET BY MOUTH EVERY DAY  . Multiple Vitamins-Minerals (PRESERVISION AREDS 2 PO) Take 1 capsule by mouth daily.   . pantoprazole (PROTONIX) 40 MG tablet Take 1 tablet (40 mg total) by mouth daily.  . potassium chloride (KLOR-CON) 10 MEQ tablet Take 1 tablet 10 meq every other day with Lasix.  Marland Kitchen pravastatin (PRAVACHOL) 40 MG tablet TAKE 1 TABLET BY MOUTH DAILY. FOLLOW-UP APPT W/LABS ARE DUE MUST SEE PROVIDER FOR FUTURE REFILLS  . PROLIA 60 MG/ML SOSY injection Inject 60 mg into the skin every 6 (six) months.   . umeclidinium-vilanterol (ANORO ELLIPTA) 62.5-25 MCG/INH AEPB INHALE 1 PUFF BY MOUTH EVERY DAY   No facility-administered encounter medications on file as of 03/15/2020.     Immunization History  Administered Date(s) Administered  . Fluad Quad(high Dose 65+) 04/18/2019  . Influenza, High Dose Seasonal PF 04/09/2014, 06/23/2015, 06/11/2017, 05/10/2018  . Influenza, Seasonal, Injecte, Preservative Fre 06/20/2012  . Influenza,inj,Quad PF,6+ Mos 05/22/2013  . Janssen (J&J) SARS-COV-2 Vaccination 10/25/2019  . Pneumococcal Conjugate-13 12/06/2016  . Pneumococcal Polysaccharide-23 09/26/2013, 08/13/2015  . Tdap 11/13/2012     PFTs PFT Results Latest Ref Rng & Units 05/21/2019  FVC-Pre L 1.80  FVC-Predicted Pre % 72  FVC-Post L 1.81  FVC-Predicted Post % 73  Pre FEV1/FVC % % 54  Post FEV1/FCV % % 53  FEV1-Pre L 0.98  FEV1-Predicted Pre % 53  FEV1-Post L 0.95  DLCO uncorrected ml/min/mmHg 7.34  DLCO UNC% %  39  DLVA Predicted % 55  TLC L 4.61  TLC % Predicted % 91  RV % Predicted % 109     Assessment   1. Anoro Ellipta Patient Assistance  Conducted a peak inspiratory flow rate (PIFR) measurement using In-Check DIAL to assess patient's ability to generate adequate inspiratory flow rate from DPIs such as the Anoro Ellipta that she is currently using. From observing the patient's breathing technique, patient is inconsistent in generating  the needed inspiratory flow which may result in ineffective inhalation of Anoro Ellipta. Discussed switching to [employer] with a spacer. Explained Charolotte Eke is a MDI which will allow her to inhale the medication properly without a required inspiratory flow rate. Patient agreed to start [place]. Dicussed the proper administration technique of Bevespi with a spacer. Utilized a "How to use [employer] online video and the teach back method. Provided patient with sample of a spacer to take home for personal use.  Patient filled out [place] patient assistance application for Bevespi and pharmacy will fax form to company. Instructed patient to continue Anoro Ellipta until her patient assistance application has been approved and she has received her shipment in the mail. Patient verablized understanding.   2. Medication Reconciliation  A drug regimen assessment was performed, including review of allergies, interactions, disease-state management, dosing and immunization history. Medications were reviewed with the patient, including name, instructions, indication, goals of therapy, potential side effects, importance of adherence, and safe use.  3. Immunizations  Patient is up-to-date with influenzae, pneumonia, and shingles vaccinations.  PLAN 1. Discontinue Anoro Ellipta once patient has received Bevespi via patient assistance program 2. Fax patient assistance application to [place] for approval  3. Once received, start Bevespi Aerosphere 9 mcg/4.8 mcg 2 inhalations twice daily   All questions encouraged and answered.  Instructed patient to reach out with any further questions or concerns.  Thank you for allowing pharmacy to participate in this patient's care.  This appointment required 60 minutes of patient care (this includes precharting, chart review, review of results, face-to-face care, etc.).  Lorel Monaco, PharmD PGY2 Ambulatory Care Resident [place]

## 2020-03-16 ENCOUNTER — Encounter: Payer: Self-pay | Admitting: Cardiology

## 2020-03-16 ENCOUNTER — Encounter: Payer: Self-pay | Admitting: Internal Medicine

## 2020-03-16 ENCOUNTER — Telehealth (INDEPENDENT_AMBULATORY_CARE_PROVIDER_SITE_OTHER): Payer: PPO | Admitting: Cardiology

## 2020-03-16 VITALS — BP 126/77 | HR 66 | Ht 64.0 in | Wt 135.0 lb

## 2020-03-16 DIAGNOSIS — Z950 Presence of cardiac pacemaker: Secondary | ICD-10-CM | POA: Diagnosis not present

## 2020-03-16 DIAGNOSIS — Z7901 Long term (current) use of anticoagulants: Secondary | ICD-10-CM | POA: Diagnosis not present

## 2020-03-16 DIAGNOSIS — J9611 Chronic respiratory failure with hypoxia: Secondary | ICD-10-CM

## 2020-03-16 DIAGNOSIS — M25519 Pain in unspecified shoulder: Secondary | ICD-10-CM

## 2020-03-16 DIAGNOSIS — I6523 Occlusion and stenosis of bilateral carotid arteries: Secondary | ICD-10-CM

## 2020-03-16 DIAGNOSIS — J439 Emphysema, unspecified: Secondary | ICD-10-CM

## 2020-03-16 DIAGNOSIS — I480 Paroxysmal atrial fibrillation: Secondary | ICD-10-CM

## 2020-03-16 DIAGNOSIS — I2510 Atherosclerotic heart disease of native coronary artery without angina pectoris: Secondary | ICD-10-CM

## 2020-03-16 NOTE — Patient Instructions (Signed)
Medication Instructions:  Your physician recommends that you continue on your current medications as directed. Please refer to the Current Medication list given to you today.  *If you need a refill on your cardiac medications before your next appointment, please call your pharmacy*   Follow-Up: At [facility], you and your health needs are our priority.  As part of our continuing mission to provide you with exceptional heart care, we have created designated Provider Care Teams.  These Care Teams include your primary Cardiologist (physician) and Advanced Practice Providers (APPs -  Physician Assistants and Nurse Practitioners) who all work together to provide you with the care you need, when you need it.  We recommend signing up for the patient portal called "MyChart".  Sign up information is provided on this After Visit Summary.  MyChart is used to connect with patients for Virtual Visits (Telemedicine).  Patients are able to view lab/test results, encounter notes, upcoming appointments, etc.  Non-urgent messages can be sent to your provider as well.   To learn more about what you can do with MyChart, go to [email or web address].    Your next appointment:   6 month(s)  The format for your next appointment:   In Person  Provider:   Quay Burow, MD   Other Instructions [email or web address]
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
## 2020-03-20 NOTE — Addendum Note (Signed)
Addended by: Binnie Rail on: 03/20/2020 11:11 AM   Modules accepted: Orders

## 2020-03-23 ENCOUNTER — Ambulatory Visit (INDEPENDENT_AMBULATORY_CARE_PROVIDER_SITE_OTHER): Payer: PPO

## 2020-03-23 ENCOUNTER — Encounter: Payer: Self-pay | Admitting: Orthopaedic Surgery

## 2020-03-23 ENCOUNTER — Ambulatory Visit: Payer: PPO | Admitting: Orthopaedic Surgery

## 2020-03-23 DIAGNOSIS — M25512 Pain in left shoulder: Secondary | ICD-10-CM | POA: Diagnosis not present

## 2020-03-23 DIAGNOSIS — M542 Cervicalgia: Secondary | ICD-10-CM

## 2020-03-23 DIAGNOSIS — M1711 Unilateral primary osteoarthritis, right knee: Secondary | ICD-10-CM

## 2020-03-23 MED ORDER — PREDNISONE 10 MG (21) PO TBPK: ORAL_TABLET | ORAL | 0 refills | Status: DC

## 2020-03-23 MED ORDER — BUPIVACAINE HCL 0.5 % IJ SOLN
2.0000 mL | INTRAMUSCULAR | Status: AC | PRN
  Administered 2020-03-23: 2 mL via INTRA_ARTICULAR

## 2020-03-23 MED ORDER — LIDOCAINE HCL 1 % IJ SOLN
2.0000 mL | INTRAMUSCULAR | Status: AC | PRN
  Administered 2020-03-23: 2 mL

## 2020-03-23 MED ORDER — METHYLPREDNISOLONE ACETATE 40 MG/ML IJ SUSP
40.0000 mg | INTRAMUSCULAR | Status: AC | PRN
  Administered 2020-03-23: 40 mg via INTRA_ARTICULAR
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
## 2020-03-24 ENCOUNTER — Other Ambulatory Visit: Payer: Self-pay | Admitting: Internal Medicine

## 2020-03-24 ENCOUNTER — Encounter: Payer: Self-pay | Admitting: Internal Medicine

## 2020-03-24 DIAGNOSIS — F329 Major depressive disorder, single episode, unspecified: Secondary | ICD-10-CM | POA: Diagnosis not present

## 2020-03-24 DIAGNOSIS — I480 Paroxysmal atrial fibrillation: Secondary | ICD-10-CM | POA: Diagnosis not present

## 2020-03-24 DIAGNOSIS — K219 Gastro-esophageal reflux disease without esophagitis: Secondary | ICD-10-CM | POA: Diagnosis not present

## 2020-03-24 DIAGNOSIS — E039 Hypothyroidism, unspecified: Secondary | ICD-10-CM | POA: Diagnosis not present

## 2020-03-24 DIAGNOSIS — I509 Heart failure, unspecified: Secondary | ICD-10-CM | POA: Diagnosis not present

## 2020-03-24 DIAGNOSIS — R7303 Prediabetes: Secondary | ICD-10-CM | POA: Diagnosis not present

## 2020-03-24 DIAGNOSIS — C3431 Malignant neoplasm of lower lobe, right bronchus or lung: Secondary | ICD-10-CM | POA: Diagnosis not present

## 2020-03-24 DIAGNOSIS — F419 Anxiety disorder, unspecified: Secondary | ICD-10-CM | POA: Diagnosis not present

## 2020-03-24 DIAGNOSIS — E785 Hyperlipidemia, unspecified: Secondary | ICD-10-CM | POA: Diagnosis not present

## 2020-03-24 DIAGNOSIS — J9621 Acute and chronic respiratory failure with hypoxia: Secondary | ICD-10-CM | POA: Diagnosis not present

## 2020-03-24 DIAGNOSIS — G9341 Metabolic encephalopathy: Secondary | ICD-10-CM | POA: Diagnosis not present

## 2020-03-24 DIAGNOSIS — J9622 Acute and chronic respiratory failure with hypercapnia: Secondary | ICD-10-CM | POA: Diagnosis not present

## 2020-03-24 DIAGNOSIS — K449 Diaphragmatic hernia without obstruction or gangrene: Secondary | ICD-10-CM | POA: Diagnosis not present

## 2020-03-24 DIAGNOSIS — I110 Hypertensive heart disease with heart failure: Secondary | ICD-10-CM | POA: Diagnosis not present

## 2020-03-24 DIAGNOSIS — I951 Orthostatic hypotension: Secondary | ICD-10-CM | POA: Diagnosis not present

## 2020-03-24 DIAGNOSIS — F0390 Unspecified dementia without behavioral disturbance: Secondary | ICD-10-CM | POA: Diagnosis not present

## 2020-03-24 DIAGNOSIS — K2970 Gastritis, unspecified, without bleeding: Secondary | ICD-10-CM | POA: Diagnosis not present

## 2020-03-24 DIAGNOSIS — I252 Old myocardial infarction: Secondary | ICD-10-CM | POA: Diagnosis not present

## 2020-03-24 DIAGNOSIS — M47816 Spondylosis without myelopathy or radiculopathy, lumbar region: Secondary | ICD-10-CM | POA: Diagnosis not present

## 2020-03-24 DIAGNOSIS — J439 Emphysema, unspecified: Secondary | ICD-10-CM | POA: Diagnosis not present

## 2020-03-24 DIAGNOSIS — I2510 Atherosclerotic heart disease of native coronary artery without angina pectoris: Secondary | ICD-10-CM | POA: Diagnosis not present

## 2020-03-24 DIAGNOSIS — K5790 Diverticulosis of intestine, part unspecified, without perforation or abscess without bleeding: Secondary | ICD-10-CM | POA: Diagnosis not present

## 2020-03-24 DIAGNOSIS — D509 Iron deficiency anemia, unspecified: Secondary | ICD-10-CM | POA: Diagnosis not present

## 2020-03-24 DIAGNOSIS — I700 Atherosclerosis of aorta: Secondary | ICD-10-CM | POA: Diagnosis not present

## 2020-03-24 DIAGNOSIS — D630 Anemia in neoplastic disease: Secondary | ICD-10-CM | POA: Diagnosis not present

## 2020-03-24 NOTE — Telephone Encounter (Signed)
Sent to Dr. John. 

## 2020-03-24 NOTE — Telephone Encounter (Signed)
Done erx  Further refills to pcp please

## 2020-03-25 MED ORDER — IPRATROPIUM BROMIDE 0.02 % IN SOLN: 0.5000 mg | Freq: Four times a day (QID) | RESPIRATORY_TRACT | 11 refills | Status: DC | PRN

## 2020-03-26 ENCOUNTER — Other Ambulatory Visit: Payer: Self-pay | Admitting: Neurology

## 2020-03-26 ENCOUNTER — Other Ambulatory Visit: Payer: Self-pay | Admitting: Internal Medicine

## 2020-03-26 DIAGNOSIS — M810 Age-related osteoporosis without current pathological fracture: Secondary | ICD-10-CM

## 2020-03-28 ENCOUNTER — Other Ambulatory Visit: Payer: Self-pay | Admitting: Neurology

## 2020-04-06 ENCOUNTER — Telehealth: Payer: Self-pay | Admitting: *Deleted

## 2020-04-06 NOTE — Telephone Encounter (Signed)
FAXED PATIENT ASSISTANCE  FORM TO [facility]ST[facility] MYERS PATIENT ASSISTANCE  FOR  ELIQUIS 2.5 MG TWICE A DAY

## 2020-04-07 ENCOUNTER — Ambulatory Visit: Payer: PPO | Admitting: Gastroenterology

## 2020-04-07 ENCOUNTER — Other Ambulatory Visit (INDEPENDENT_AMBULATORY_CARE_PROVIDER_SITE_OTHER): Payer: PPO

## 2020-04-07 ENCOUNTER — Encounter: Payer: Self-pay | Admitting: Gastroenterology

## 2020-04-07 VITALS — BP 136/68 | HR 60 | Ht 64.0 in | Wt 137.0 lb

## 2020-04-07 DIAGNOSIS — D649 Anemia, unspecified: Secondary | ICD-10-CM | POA: Diagnosis not present

## 2020-04-07 DIAGNOSIS — K449 Diaphragmatic hernia without obstruction or gangrene: Secondary | ICD-10-CM

## 2020-04-07 DIAGNOSIS — R1310 Dysphagia, unspecified: Secondary | ICD-10-CM | POA: Diagnosis not present

## 2020-04-07 LAB — CBC WITH DIFFERENTIAL/PLATELET
Basophils Absolute: 0.1 10*3/uL (ref 0.0–0.1)
Basophils Relative: 1.2 % (ref 0.0–3.0)
Eosinophils Absolute: 0.3 10*3/uL (ref 0.0–0.7)
Eosinophils Relative: 4.2 % (ref 0.0–5.0)
HCT: 32.9 % — ABNORMAL LOW (ref 36.0–46.0)
Hemoglobin: 10.3 g/dL — ABNORMAL LOW (ref 12.0–15.0)
Lymphocytes Relative: 19.4 % (ref 12.0–46.0)
Lymphs Abs: 1.5 10*3/uL (ref 0.7–4.0)
MCHC: 31.3 g/dL (ref 30.0–36.0)
MCV: 84.3 fl (ref 78.0–100.0)
Monocytes Absolute: 0.9 10*3/uL (ref 0.1–1.0)
Monocytes Relative: 11.8 % (ref 3.0–12.0)
Neutro Abs: 4.9 10*3/uL (ref 1.4–7.7)
Neutrophils Relative %: 63.4 % (ref 43.0–77.0)
Platelets: 155 10*3/uL (ref 150.0–400.0)
RBC: 3.9 Mil/uL (ref 3.87–5.11)
RDW: 14.9 % (ref 11.5–15.5)
WBC: 7.7 10*3/uL (ref 4.0–10.5)

## 2020-04-07 MED ORDER — PANTOPRAZOLE SODIUM 40 MG PO TBEC
40.0000 mg | DELAYED_RELEASE_TABLET | Freq: Two times a day (BID) | ORAL | 4 refills | Status: DC
Start: 2020-04-07 — End: 2020-09-01
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
## 2020-04-07 NOTE — Patient Instructions (Addendum)
If you are age 84 or older, your body mass index should be between 23-30. Your Body mass index is 23.52 kg/m. If this is out of the aforementioned range listed, please consider follow up with your Primary Care Provider.  If you are age 74 or younger, your body mass index should be between 19-25. Your Body mass index is 23.52 kg/m. If this is out of the aformentioned range listed, please consider follow up with your Primary Care Provider.   Your provider has requested that you go to the basement level for lab work before leaving today. Press "B" on the elevator. The lab is located at the first door on the left as you exit the elevator.  Continue Pantoprazole 40 mg twice daily.  Follow up as needed.

## 2020-04-08 ENCOUNTER — Other Ambulatory Visit: Payer: Self-pay

## 2020-04-08 ENCOUNTER — Ambulatory Visit (INDEPENDENT_AMBULATORY_CARE_PROVIDER_SITE_OTHER)
Admission: RE | Admit: 2020-04-08 | Discharge: 2020-04-08 | Disposition: A | Discharge status: Home / Self Care | Payer: PPO | Source: Ambulatory Visit | Attending: Internal Medicine | Admitting: Internal Medicine

## 2020-04-08 ENCOUNTER — Other Ambulatory Visit: Payer: PPO

## 2020-04-08 DIAGNOSIS — M810 Age-related osteoporosis without current pathological fracture: Secondary | ICD-10-CM | POA: Diagnosis not present

## 2020-04-08 NOTE — Progress Notes (Signed)
Reviewed and agree with management plan.  Cleston Lautner T. Ermalinda Joubert, MD FACG [facility]  

## 2020-04-09 DIAGNOSIS — I110 Hypertensive heart disease with heart failure: Secondary | ICD-10-CM | POA: Diagnosis not present

## 2020-04-09 DIAGNOSIS — K219 Gastro-esophageal reflux disease without esophagitis: Secondary | ICD-10-CM | POA: Diagnosis not present

## 2020-04-09 DIAGNOSIS — E039 Hypothyroidism, unspecified: Secondary | ICD-10-CM | POA: Diagnosis not present

## 2020-04-09 DIAGNOSIS — I252 Old myocardial infarction: Secondary | ICD-10-CM | POA: Diagnosis not present

## 2020-04-09 DIAGNOSIS — F329 Major depressive disorder, single episode, unspecified: Secondary | ICD-10-CM | POA: Diagnosis not present

## 2020-04-09 DIAGNOSIS — D509 Iron deficiency anemia, unspecified: Secondary | ICD-10-CM | POA: Diagnosis not present

## 2020-04-09 DIAGNOSIS — F419 Anxiety disorder, unspecified: Secondary | ICD-10-CM | POA: Diagnosis not present

## 2020-04-09 DIAGNOSIS — J9621 Acute and chronic respiratory failure with hypoxia: Secondary | ICD-10-CM | POA: Diagnosis not present

## 2020-04-09 DIAGNOSIS — I700 Atherosclerosis of aorta: Secondary | ICD-10-CM | POA: Diagnosis not present

## 2020-04-09 DIAGNOSIS — I480 Paroxysmal atrial fibrillation: Secondary | ICD-10-CM | POA: Diagnosis not present

## 2020-04-09 DIAGNOSIS — I2510 Atherosclerotic heart disease of native coronary artery without angina pectoris: Secondary | ICD-10-CM | POA: Diagnosis not present

## 2020-04-09 DIAGNOSIS — E785 Hyperlipidemia, unspecified: Secondary | ICD-10-CM | POA: Diagnosis not present

## 2020-04-09 DIAGNOSIS — K449 Diaphragmatic hernia without obstruction or gangrene: Secondary | ICD-10-CM | POA: Diagnosis not present

## 2020-04-09 DIAGNOSIS — K2970 Gastritis, unspecified, without bleeding: Secondary | ICD-10-CM | POA: Diagnosis not present

## 2020-04-09 DIAGNOSIS — I951 Orthostatic hypotension: Secondary | ICD-10-CM | POA: Diagnosis not present

## 2020-04-09 DIAGNOSIS — G9341 Metabolic encephalopathy: Secondary | ICD-10-CM | POA: Diagnosis not present

## 2020-04-09 DIAGNOSIS — C3431 Malignant neoplasm of lower lobe, right bronchus or lung: Secondary | ICD-10-CM | POA: Diagnosis not present

## 2020-04-09 DIAGNOSIS — J9622 Acute and chronic respiratory failure with hypercapnia: Secondary | ICD-10-CM | POA: Diagnosis not present

## 2020-04-09 DIAGNOSIS — K5790 Diverticulosis of intestine, part unspecified, without perforation or abscess without bleeding: Secondary | ICD-10-CM | POA: Diagnosis not present

## 2020-04-09 DIAGNOSIS — I509 Heart failure, unspecified: Secondary | ICD-10-CM | POA: Diagnosis not present

## 2020-04-09 DIAGNOSIS — D630 Anemia in neoplastic disease: Secondary | ICD-10-CM | POA: Diagnosis not present

## 2020-04-09 DIAGNOSIS — M47816 Spondylosis without myelopathy or radiculopathy, lumbar region: Secondary | ICD-10-CM | POA: Diagnosis not present

## 2020-04-09 DIAGNOSIS — F0390 Unspecified dementia without behavioral disturbance: Secondary | ICD-10-CM | POA: Diagnosis not present

## 2020-04-09 DIAGNOSIS — J439 Emphysema, unspecified: Secondary | ICD-10-CM | POA: Diagnosis not present

## 2020-04-09 DIAGNOSIS — R7303 Prediabetes: Secondary | ICD-10-CM | POA: Diagnosis not present

## 2020-04-09 NOTE — Telephone Encounter (Signed)
Patient has been approved for patient assistance.

## 2020-04-10 DIAGNOSIS — J441 Chronic obstructive pulmonary disease with (acute) exacerbation: Secondary | ICD-10-CM | POA: Diagnosis not present

## 2020-04-12 ENCOUNTER — Encounter: Payer: Self-pay | Admitting: Internal Medicine

## 2020-04-12 NOTE — Telephone Encounter (Signed)
[facility] notified. Samples are at the front desk. [name] is asking what the pt assistance # is so she can call and see when it was shipped. I do not see where it was scanned into the chart.

## 2020-04-12 NOTE — Telephone Encounter (Signed)
Spoke with pt daughter, number to BMS given.

## 2020-04-12 NOTE — Telephone Encounter (Signed)
FU   Pt Daughter called and stated pt is now out of the Eliquis and would like to fu on the status on the Pt Assistance ?    Best call back number [phone]

## 2020-04-14 DIAGNOSIS — J441 Chronic obstructive pulmonary disease with (acute) exacerbation: Secondary | ICD-10-CM | POA: Diagnosis not present

## 2020-04-14 DIAGNOSIS — J439 Emphysema, unspecified: Secondary | ICD-10-CM | POA: Diagnosis not present

## 2020-04-14 DIAGNOSIS — I5031 Acute diastolic (congestive) heart failure: Secondary | ICD-10-CM | POA: Diagnosis not present

## 2020-04-14 DIAGNOSIS — J9611 Chronic respiratory failure with hypoxia: Secondary | ICD-10-CM | POA: Diagnosis not present

## 2020-04-14 DIAGNOSIS — C3491 Malignant neoplasm of unspecified part of right bronchus or lung: Secondary | ICD-10-CM | POA: Diagnosis not present

## 2020-04-19 ENCOUNTER — Ambulatory Visit (INDEPENDENT_AMBULATORY_CARE_PROVIDER_SITE_OTHER): Payer: PPO | Admitting: Adult Health

## 2020-04-19 ENCOUNTER — Ambulatory Visit: Payer: PPO | Admitting: Nurse Practitioner

## 2020-04-19 ENCOUNTER — Encounter: Payer: Self-pay | Admitting: Adult Health

## 2020-04-19 ENCOUNTER — Other Ambulatory Visit: Payer: Self-pay

## 2020-04-19 VITALS — BP 156/65 | HR 65 | Ht 64.0 in | Wt 138.0 lb

## 2020-04-19 DIAGNOSIS — R413 Other amnesia: Secondary | ICD-10-CM

## 2020-04-19 DIAGNOSIS — G609 Hereditary and idiopathic neuropathy, unspecified: Secondary | ICD-10-CM | POA: Diagnosis not present

## 2020-04-19 DIAGNOSIS — G7000 Myasthenia gravis without (acute) exacerbation: Secondary | ICD-10-CM | POA: Diagnosis not present
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
## 2020-04-19 NOTE — Patient Instructions (Signed)
Your Plan:  Continue Cymbalta and Gabapentin  Continue Aricept for memory Use rollator at all times to prevent falls If your symptoms worsen or you develop new symptoms please let us know.   Thank you for coming to see [place] at [facility]. I hope we have been able to provide you high quality care today.  You may receive a patient satisfaction survey over the next few weeks. We would appreciate your feedback and comments so that we may continue to improve ourselves and the health of our patients.

## 2020-04-20 NOTE — Progress Notes (Signed)
I have read the note, and I agree with the clinical assessment and plan.  Brandi Raymond   

## 2020-04-21 ENCOUNTER — Other Ambulatory Visit: Payer: Self-pay | Admitting: Internal Medicine

## 2020-05-01 ENCOUNTER — Other Ambulatory Visit: Payer: Self-pay | Admitting: Internal Medicine

## 2020-05-01 NOTE — Telephone Encounter (Signed)
Done erx 

## 2020-05-06 ENCOUNTER — Encounter: Payer: Self-pay | Admitting: Orthopaedic Surgery

## 2020-05-10 DIAGNOSIS — J441 Chronic obstructive pulmonary disease with (acute) exacerbation: Secondary | ICD-10-CM | POA: Diagnosis not present

## 2020-05-10 NOTE — Telephone Encounter (Signed)
Ok please let her know the situation.  Thanks.

## 2020-05-14 DIAGNOSIS — J441 Chronic obstructive pulmonary disease with (acute) exacerbation: Secondary | ICD-10-CM | POA: Diagnosis not present

## 2020-05-14 DIAGNOSIS — I5031 Acute diastolic (congestive) heart failure: Secondary | ICD-10-CM | POA: Diagnosis not present

## 2020-05-14 DIAGNOSIS — C3491 Malignant neoplasm of unspecified part of right bronchus or lung: Secondary | ICD-10-CM | POA: Diagnosis not present

## 2020-05-14 DIAGNOSIS — J9611 Chronic respiratory failure with hypoxia: Secondary | ICD-10-CM | POA: Diagnosis not present

## 2020-05-14 DIAGNOSIS — J439 Emphysema, unspecified: Secondary | ICD-10-CM | POA: Diagnosis not present
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
## 2020-05-21 ENCOUNTER — Other Ambulatory Visit: Payer: Self-pay

## 2020-05-21 ENCOUNTER — Encounter (HOSPITAL_COMMUNITY): Payer: Self-pay

## 2020-05-21 ENCOUNTER — Inpatient Hospital Stay: Payer: PPO | Attending: Physician Assistant

## 2020-05-21 ENCOUNTER — Ambulatory Visit (HOSPITAL_COMMUNITY)
Admission: RE | Admit: 2020-05-21 | Discharge: 2020-05-21 | Disposition: A | Discharge status: Home / Self Care | Payer: PPO | Source: Ambulatory Visit | Attending: Internal Medicine | Admitting: Internal Medicine

## 2020-05-21 DIAGNOSIS — I509 Heart failure, unspecified: Secondary | ICD-10-CM | POA: Insufficient documentation

## 2020-05-21 DIAGNOSIS — C3431 Malignant neoplasm of lower lobe, right bronchus or lung: Secondary | ICD-10-CM | POA: Diagnosis not present

## 2020-05-21 DIAGNOSIS — K449 Diaphragmatic hernia without obstruction or gangrene: Secondary | ICD-10-CM | POA: Diagnosis not present

## 2020-05-21 DIAGNOSIS — I2510 Atherosclerotic heart disease of native coronary artery without angina pectoris: Secondary | ICD-10-CM | POA: Diagnosis not present

## 2020-05-21 DIAGNOSIS — Z9981 Dependence on supplemental oxygen: Secondary | ICD-10-CM | POA: Insufficient documentation

## 2020-05-21 DIAGNOSIS — J439 Emphysema, unspecified: Secondary | ICD-10-CM | POA: Insufficient documentation

## 2020-05-21 DIAGNOSIS — C3490 Malignant neoplasm of unspecified part of unspecified bronchus or lung: Secondary | ICD-10-CM

## 2020-05-21 DIAGNOSIS — R911 Solitary pulmonary nodule: Secondary | ICD-10-CM | POA: Diagnosis not present

## 2020-05-21 LAB — CMP (CANCER CENTER ONLY)
ALT: 10 U/L (ref 0–44)
AST: 21 U/L (ref 15–41)
Albumin: 3 g/dL — ABNORMAL LOW (ref 3.5–5.0)
Alkaline Phosphatase: 60 U/L (ref 38–126)
Anion gap: 8 (ref 5–15)
BUN: 19 mg/dL (ref 8–23)
CO2: 33 mmol/L — ABNORMAL HIGH (ref 22–32)
Calcium: 8.7 mg/dL — ABNORMAL LOW (ref 8.9–10.3)
Chloride: 102 mmol/L (ref 98–111)
Creatinine: 1.25 mg/dL — ABNORMAL HIGH (ref 0.44–1.00)
GFR, Estimated: 43 mL/min — ABNORMAL LOW (ref >60)
Glucose, Bld: 100 mg/dL — ABNORMAL HIGH (ref 70–99)
Potassium: 4 mmol/L (ref 3.5–5.1)
Sodium: 143 mmol/L (ref 135–145)
Total Bilirubin: 0.3 mg/dL (ref 0.3–1.2)
Total Protein: 5.8 g/dL — ABNORMAL LOW (ref 6.5–8.1)

## 2020-05-21 LAB — CBC WITH DIFFERENTIAL (CANCER CENTER ONLY)
Abs Immature Granulocytes: 0.07 10*3/uL (ref 0.00–0.07)
Basophils Absolute: 0.1 10*3/uL (ref 0.0–0.1)
Basophils Relative: 1 %
Eosinophils Absolute: 0.3 10*3/uL (ref 0.0–0.5)
Eosinophils Relative: 5 %
HCT: 31.6 % — ABNORMAL LOW (ref 36.0–46.0)
Hemoglobin: 9.4 g/dL — ABNORMAL LOW (ref 12.0–15.0)
Immature Granulocytes: 1 %
Lymphocytes Relative: 30 %
Lymphs Abs: 1.7 10*3/uL (ref 0.7–4.0)
MCH: 25.8 pg — ABNORMAL LOW (ref 26.0–34.0)
MCHC: 29.7 g/dL — ABNORMAL LOW (ref 30.0–36.0)
MCV: 86.6 fL (ref 80.0–100.0)
Monocytes Absolute: 1 10*3/uL (ref 0.1–1.0)
Monocytes Relative: 17 %
Neutro Abs: 2.5 10*3/uL (ref 1.7–7.7)
Neutrophils Relative %: 46 %
Platelet Count: 142 10*3/uL — ABNORMAL LOW (ref 150–400)
RBC: 3.65 MIL/uL — ABNORMAL LOW (ref 3.87–5.11)
RDW: 13.9 % (ref 11.5–15.5)
WBC Count: 5.5 10*3/uL (ref 4.0–10.5)
nRBC: 0 % (ref 0.0–0.2)

## 2020-05-21 MED ORDER — IOHEXOL 300 MG/ML  SOLN
75.0000 mL | Freq: Once | INTRAMUSCULAR | Status: AC | PRN
  Administered 2020-05-21: 60 mL via INTRAVENOUS

## 2020-05-24 ENCOUNTER — Ambulatory Visit (INDEPENDENT_AMBULATORY_CARE_PROVIDER_SITE_OTHER): Payer: PPO

## 2020-05-24 ENCOUNTER — Other Ambulatory Visit: Payer: Self-pay | Admitting: Neurology

## 2020-05-24 ENCOUNTER — Ambulatory Visit: Payer: PPO | Admitting: Internal Medicine

## 2020-05-24 DIAGNOSIS — I441 Atrioventricular block, second degree: Secondary | ICD-10-CM

## 2020-05-25 ENCOUNTER — Encounter: Payer: Self-pay | Admitting: Physician Assistant

## 2020-05-25 ENCOUNTER — Telehealth: Payer: Self-pay | Admitting: Physician Assistant

## 2020-05-25 ENCOUNTER — Other Ambulatory Visit: Payer: Self-pay

## 2020-05-25 ENCOUNTER — Inpatient Hospital Stay (HOSPITAL_BASED_OUTPATIENT_CLINIC_OR_DEPARTMENT_OTHER): Payer: PPO | Admitting: Physician Assistant

## 2020-05-25 VITALS — BP 123/66 | HR 74 | Temp 98.4°F | Resp 18 | Ht 64.0 in | Wt 139.7 lb

## 2020-05-25 DIAGNOSIS — C3491 Malignant neoplasm of unspecified part of right bronchus or lung: Secondary | ICD-10-CM

## 2020-05-25 DIAGNOSIS — C3431 Malignant neoplasm of lower lobe, right bronchus or lung: Secondary | ICD-10-CM | POA: Diagnosis not present

## 2020-05-25 NOTE — Telephone Encounter (Signed)
Scheduled per 11/9 los. No avs or calendar needed to be printed. Pt is aware of appt times and dates.

## 2020-05-26 LAB — CUP PACEART REMOTE DEVICE CHECK
Battery Remaining Longevity: 127 mo
Battery Remaining Percentage: 95.5 %
Battery Voltage: 3.02 V
Brady Statistic AP VP Percent: 4.5 %
Brady Statistic AP VS Percent: 1.3 %
Brady Statistic AS VP Percent: 72 %
Brady Statistic AS VS Percent: 22 %
Brady Statistic RA Percent Paced: 5.5 %
Brady Statistic RV Percent Paced: 76 %
Date Time Interrogation Session: 20211108010018
Implantable Lead Implant Date: 20210204
Implantable Lead Implant Date: 20210204
Implantable Lead Location: 753859
Implantable Lead Location: 753860
Implantable Pulse Generator Implant Date: 20210204
Lead Channel Impedance Value: 440 Ohm
Lead Channel Impedance Value: 550 Ohm
Lead Channel Pacing Threshold Amplitude: 0.625 V
Lead Channel Pacing Threshold Amplitude: 0.625 V
Lead Channel Pacing Threshold Pulse Width: 0.4 ms
Lead Channel Pacing Threshold Pulse Width: 0.5 ms
Lead Channel Sensing Intrinsic Amplitude: 12 mV
Lead Channel Sensing Intrinsic Amplitude: 4.6 mV
Lead Channel Setting Pacing Amplitude: 0.875
Lead Channel Setting Pacing Amplitude: 1.625
Lead Channel Setting Pacing Pulse Width: 0.4 ms
Lead Channel Setting Sensing Sensitivity: 2.5 mV
Pulse Gen Model: 2272
Pulse Gen Serial Number: 9196859

## 2020-05-26 NOTE — Progress Notes (Signed)
Remote pacemaker transmission.   

## 2020-05-31 ENCOUNTER — Other Ambulatory Visit: Payer: Self-pay | Admitting: Internal Medicine

## 2020-05-31 NOTE — Telephone Encounter (Signed)
Ok forward to [employer]

## 2020-06-01 ENCOUNTER — Encounter: Payer: Self-pay | Admitting: Internal Medicine

## 2020-06-01 ENCOUNTER — Telehealth: Payer: Self-pay | Admitting: Cardiovascular Disease

## 2020-06-01 NOTE — Telephone Encounter (Signed)
Patient's daughter states the patient is schedule for a carotid test 06/08/2020 and another one on 08/09/2020. She would like to know if they are the same test and whether the patient needs to have them both done.

## 2020-06-01 NOTE — Telephone Encounter (Signed)
The patient is scheduled for a Carotid on 11/23 and 08/09/20 with [facility]. The patient's daughter has asked that the 11/23 appointment be canceled since they are the same test and the patient has a Mesenteric doppler on 08/09/20 as well.

## 2020-06-02 ENCOUNTER — Telehealth: Payer: Self-pay | Admitting: Pulmonary Disease

## 2020-06-02 NOTE — Telephone Encounter (Signed)
Spoke with pt's daughter, [name]. Pt has been scheduled tomorrow with Beth at 0930. Pt has been coughing for weeks. Nothing further was needed.

## 2020-06-03 ENCOUNTER — Other Ambulatory Visit: Payer: Self-pay

## 2020-06-03 ENCOUNTER — Ambulatory Visit (INDEPENDENT_AMBULATORY_CARE_PROVIDER_SITE_OTHER): Payer: PPO

## 2020-06-03 ENCOUNTER — Encounter: Payer: Self-pay | Admitting: Primary Care

## 2020-06-03 ENCOUNTER — Ambulatory Visit: Payer: PPO | Admitting: Primary Care

## 2020-06-03 ENCOUNTER — Telehealth: Payer: Self-pay | Admitting: Primary Care

## 2020-06-03 VITALS — BP 120/64 | HR 61 | Temp 98.8°F | Ht 64.0 in | Wt 138.8 lb

## 2020-06-03 DIAGNOSIS — J441 Chronic obstructive pulmonary disease with (acute) exacerbation: Secondary | ICD-10-CM | POA: Diagnosis not present

## 2020-06-03 DIAGNOSIS — J449 Chronic obstructive pulmonary disease, unspecified: Secondary | ICD-10-CM | POA: Diagnosis not present

## 2020-06-03 DIAGNOSIS — K449 Diaphragmatic hernia without obstruction or gangrene: Secondary | ICD-10-CM | POA: Diagnosis not present

## 2020-06-03 MED ORDER — METHYLPREDNISOLONE ACETATE 80 MG/ML IJ SUSP
80.0000 mg | Freq: Once | INTRAMUSCULAR | Status: AC
  Administered 2020-06-03: 80 mg via INTRAMUSCULAR

## 2020-06-03 MED ORDER — AMOXICILLIN-POT CLAVULANATE 875-125 MG PO TABS: 1.0000 | ORAL_TABLET | Freq: Two times a day (BID) | ORAL | 0 refills | Status: DC

## 2020-06-03 MED ORDER — PREDNISONE 10 MG PO TABS: ORAL_TABLET | ORAL | 0 refills | Status: DC

## 2020-06-03 NOTE — Telephone Encounter (Signed)
Martyn Ehrich, NP  Cullen Lahaie, Waldemar Dickens, CMA Please let patient know CXR showed chronic lung changes without evidence of pneumonia. We will send in abx to covered for COPD exacerbation/bronchitis.    Called and spoke with pt's daughter [name] letting her know the results of cxr and she verbalized understanding. Nothing further needed.

## 2020-06-03 NOTE — Assessment & Plan Note (Addendum)
-   Productive cough x 2 weeks with associated SOB, chest tightness and wheezing. Hx aspiration pneumonia, not following precautions. LS with coarse wheezing and rhonchi. Patient received CPT in office and Depo-medrol 80mg  IM x 1 - Advised patient to Continue Bevespi as prescribed, use Albuterol nebulized scheduled TID followed by flutter valve, mucinex 1,200mg  twice daily. Strict aspiration precautions, out of bed for meals  - CXR today showed chronic findings, no acute process - Sending in prednisone taper (40mg  x 3 days. 30mg  x 3 days, 20mg  x 3, 10mg  x 3 days) and Augmentin 1 tab twice daily x 7 days - Follow-up in 1 weeks

## 2020-06-03 NOTE — Addendum Note (Signed)
Addended by: Lorretta Harp on: 06/03/2020 10:05 AM   Modules accepted: Orders

## 2020-06-03 NOTE — Progress Notes (Signed)
Please let patient know CXR showed chronic lung changes without evidence of pneumonia. We will send in abx to covered for COPD exacerbation/bronchitis.

## 2020-06-03 NOTE — Addendum Note (Signed)
Addended by: Martyn Ehrich on: 06/03/2020 10:53 AM   Modules accepted: Orders

## 2020-06-03 NOTE — Patient Instructions (Addendum)
COPD: Continue Bevespi twice daily with spacer  Use albuterol nebulizer three times a day for the next 5-7 days  Use flutter valve 3-4 times a day after nebulizer  Take mucinex 1,200mg  twice daily x 5-7 days with full glass of water  Follow aspiration precautions Stay well hydrated, get out of bed for all meals, do not over do it the next few days   Orders: Depo-medrol 80mg  IM x 1 today CXR re: COPD exacerbation, possible pna (ordered)  Follow-up: 1 week with Dr. Vaughan Browner or Eustaquio Maize NP/ If symptoms acutely worsen please go to ED    Aspiration Pneumonia Aspiration pneumonia is an infection in the lungs. It occurs when saliva or liquid contaminated with bacteria is inhaled (aspirated) into the lungs. When these things get into the lungs, swelling (inflammation) and infection can occur. This can make it difficult to breathe. Aspiration pneumonia is a serious condition and can be life threatening. What are the causes? This condition is caused when saliva or liquid from the mouth, throat, or stomach is inhaled into the lungs, and when those fluids are contaminated with bacteria. What increases the risk? The following factors may make you more likely to develop this condition:  A narrowing of the tube that carries food to the stomach (esophageal narrowing).  Having gastroesophageal reflux disease (GERD).  Having a weak immune system.  Having diabetes.  Having poor oral hygiene.  Being malnourished. The condition is more likely to occur when a person's cough (gag) reflex, or ability to swallow, has decreased. Some things that can cause this decrease include:  Having a brain injury or disease, such as stroke, seizures, Parkinson disease, dementia, or amyotrophic lateral sclerosis (ALS).  Being given a general anesthetic for procedures.  Drinking too much alcohol. If a person passes out and vomits, vomit can be inhaled into the lungs.  Taking certain medicines, such as tranquilizers  or sedatives. What are the signs or symptoms? Symptoms of this condition include:  Fever.  A cough with secretions that are yellow, tan, or green.  Breathing problems, such as wheezing or shortness of breath.  Chest pain.  Being more tired than usual (fatigue).  Having a history of coughing while eating or drinking.  Bad breath.  Bluish color to the lips, skin, or fingers. How is this diagnosed? This condition may be diagnosed based on:  A physical exam.  Tests, such as: ? Chest X-ray. ? Sputum culture. Saliva and mucus (sputum) are collected from the lungs or the tubes that carry air to the lungs (bronchi). The sputum is then tested for bacteria. ? Oximetry. A sensor or clip is placed on areas such as a finger, earlobe, or toe to measure the oxygen level in your blood. ? Blood tests. ? Swallowing study. This test looks at how food is swallowed and whether it goes into your breathing tube (trachea) or esophagus. ? Bronchoscopy. This test uses a flexible tube (bronchoscope) to see inside the lungs. How is this treated? This condition may be treated with:  Medicines. Antibiotic medicine will be given to kill the pneumonia bacteria. Other medicines may also be used to reduce fever or pain.  Breathing assistance and oxygen therapy. Depending on how well you are breathing, you may need to be given oxygen, or you may need breathing support from a breathing machine (ventilator).  Thoracentesis. This is a procedure to remove fluid that has built up in the space between the linings of the chest wall and the lungs.  Feeding tube and diet change. For people who have difficulty swallowing, a feeding tube might be placed in the stomach, or they may be asked to avoid certain food textures or liquids when eating. Follow these instructions at home: Medicines  Take over-the-counter and prescription medicines only as told by your health care provider. ? If you were prescribed an antibiotic  medicine, take it as told by your health care provider. Do not stop taking the antibiotic even if you start to feel better. ? Take cough medicine only if you are losing sleep. Cough medicine can prevent your body's natural ability to remove mucus from your lungs. General instructions  Carefully follow any eating instructions you were given, such as avoiding certain food textures or thickening your liquids. Thickening liquids reduces the risk of developing aspiration pneumonia again.  Use breathing exercises such as postural drainage, deep breathing, and incentive spirometry to help expel secretions.  Rest as instructed by your health care provider.  Sleep in a semi-upright position at night. Try to sleep in a reclining chair, or place a few pillows under your head.  Do not use any products that contain nicotine or tobacco, such as cigarettes and e-cigarettes. If you need help quitting, ask your health care provider.  Keep all follow-up visits as told by your health care provider. This is important. Contact a health care provider if:  You have a fever.  You have a worsening cough with yellow, tan, or green secretions.  You have coughing while eating or drinking. Get help right away if:  You have worsening shortness of breath, wheezing, or difficulty breathing.  You have chest pain. Summary  Aspiration pneumonia is an infection in the lungs. It is caused when saliva or liquid from the mouth, throat, or stomach is inhaled into the lungs.  Aspiration pneumonia is more likely to occur when a person's cough reflex or ability to swallow has decreased.  Symptoms of aspiration pneumonia include coughing, breathing problems, fever, and chest pain.  Aspiration pneumonia may be treated with antibiotic medicine, other medicines to reduce pain or fever, and breathing assistance or oxygen therapy. This information is not intended to replace advice given to you by your health care provider. Make  sure you discuss any questions you have with your health care provider. Document Revised: 06/15/2017 Document Reviewed: 08/08/2016 Elsevier Patient Education  2020 [employer].

## 2020-06-03 NOTE — Progress Notes (Addendum)
@Patient  ID: Brandi Raymond, female    DOB: 1933/11/27, 86 y.o.   MRN: [id number]  Chief Complaint  Patient presents with  . Follow-up    Pt has had complaints of a cough x2 weeks. Pt first was coughing up clear to yellow phlegm. Pt has also had complaints of wheezing, chest tightness, and increased SOB.    Referring provider: Binnie Rail, MD  HPI: 86 year old female, former smoker. PMH significant COPD, pulmonary chronic respiratory failure. 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 Patient of Dr. Vaughan Browner. Maintained on [place].   Previous [facility] pulmonary encounters: 01/29/2020- Hosp FU Recently hospitalized from 01/11/20-01/13/20 for COPD exacerbation, CAP RLL, acute on chronic respiratory failure. Infiltrate seen on CXR. Initially treted with vancomycin, ceftriaxone, azithromycin. She was started on prednisone 40mg  x 5 days and Atrovent nebulizer prn. Transitioned to Cefdinir and azithromycin for discharge to complete 5 days antibiotic therapy. She was weaned to home oxygen 2L. Patient present today for hospital follow-up COPD exacerbation/CAP. Accompanied by family menber. He is doing ok. She has some wheezing and dyspnea when she over exerts her self. She is on 2L oxygen.  She gets lightheaded at times if she moves too quick. She felt faint coming into the office today. She is on lasix 40mg  daily and metoprolol 25mg  daily. Blood pressure standing in right arm was 112/60 today. She is eating and drinking normally. She likes to drink coffee and pepsi.  Cough has improved some, occasionally gets up some mucus. She is using Atrovent once in awhile at home when she has congestion. She used it yesterday. Afebrile since hospitalization.   03/03/2020 - Hosp FU Patient presents today for 2-4 week follow-up. She was admitted on 02/12/20 for COPD exacerbation/aspiration pneumonia. Treated with IV cefepime, azithromycin and solu-medrol.  Repeat CXR on 02/15/20 showed near complete resolution of pneumonia involving right lower lobe superimposed on chronic scarring. She was discharged on oral omnicef and prednisone taper.    She is feeling better, mostly back to her baseline. No fever or chills. She does have a cough, mostly clearing her throat. She will occasionally cough up clear mucus. She is on [employer], takes one puff daily. Inhaler did cost 45 dollars but she is now in the donut hole.   Modified barium swallow showed mild oropharyngeal dysphagia, delayed swallow initiation. Recommend regular soild/thin liquids, frequent use of throat clear to help remove any penetration from laryngeal vestibule. Pt will need 1:1 supervision with PO intake, as she could not demonstrate use of throat clear independently when drinking.    06/03/2020- Interim hx Patient presents today for acute office visit with reports of Cough x 2 weeks. Cough is productive with clear-yellow mucus. Associated chest tightness, shortness of breath and wheezing. Last hospital admission was in July 2021 for COPD exacerbation. Since last visit Anoro was changed to [facility] by our pharmacy team d/t cost. She is compliant with Bevespi use. She started using her flutter valve again yesterday. She has not been following aspiration precautions. Overdoing it at home per her daughter. Her husband is in the hospital currently following a cardiac procedure. She received her covid vaccines but not influenza or booster. She is not interested at this time.  Allergies  Allergen Reactions  . Silver Sulfadiazine Other (See Comments)    lowers white blood count     Immunization History  Administered Date(s) Administered  . Fluad Quad(high Dose 65+) 04/18/2019  .  Influenza, High Dose Seasonal PF 04/09/2014, 06/23/2015, 06/11/2017, 05/10/2018  . Influenza, Seasonal, Injecte, Preservative Fre 06/20/2012  . Influenza,inj,Quad PF,6+ Mos 05/22/2013  . Janssen (J&J) SARS-COV-2  Vaccination 10/25/2019  . Pneumococcal Conjugate-13 12/06/2016  . Pneumococcal Polysaccharide-23 09/26/2013, 08/13/2015  . Tdap 11/13/2012    Past Medical History:  Diagnosis Date  . Adenomatous colon polyp   . Arthritis   . CAD (coronary artery disease)    PTCA of RCA   . Carotid artery occlusion   . Cervical spine fracture ([place])   . COPD (chronic obstructive pulmonary disease) ([place])   . Depression   . Diverticulosis   . Gastritis 09/15/1991  . GERD (gastroesophageal reflux disease) 09/15/1991   Dr Sharlett Iles  . Hiatal hernia 09/15/1991  . Hip fracture, right ([place])   . HLD (hyperlipidemia)   . HTN (hypertension)   . Hyperplastic polyps of stomach 11/2007   colonoscopy  . Hypertension   . Hypothyroidism    affecting the left eye, proptosis  . Iron deficiency anemia   . Lung cancer ([place])    s/p XRT  . Macular degeneration of left eye   . Memory loss   . Mesenteric artery stenosis ([place])   . Myocardial infarction ([place]) 1993  . Ocular myasthenia gravis ([place])    Dr Jannifer Franklin  . Orthostatic hypotension 06/05/2013  . PONV (postoperative nausea and vomiting)   . Strabismus    left eye  . Syncope 1998    Tobacco History: Social History   Tobacco Use  Smoking Status Former Smoker  . Packs/day: 1.00  . Years: 60.00  . Pack years: 60.00  . Types: Cigarettes  . Quit date: 07/2019  . Years since quitting: 0.8  Smokeless Tobacco Never Used  Tobacco Comment   Successfully quit   Counseling given: Not Answered Comment: Successfully quit   Outpatient Medications Prior to Visit  Medication Sig Dispense Refill  . acetaminophen (TYLENOL) 500 MG tablet Take 1,000 mg by mouth every 6 (six) hours as needed for mild pain.     . Calcium-Magnesium-Vitamin D (CALCIUM 1200+D3 PO) Take 1 tablet by mouth at bedtime.     . clonazePAM (KLONOPIN) 0.5 MG tablet TAKE 1 TABLET BY MOUTH EVERYDAY AT BEDTIME 30 tablet 0  . docusate sodium (COLACE) 100 MG capsule Take 100 mg by mouth at bedtime.      . donepezil (ARICEPT) 10 MG tablet TAKE 1 TABLET BY MOUTH EVERYDAY AT BEDTIME 90 tablet 2  . DULoxetine (CYMBALTA) 60 MG capsule TAKE 1 CAPSULE BY MOUTH EVERY DAY 90 capsule 0  . ELIQUIS 2.5 MG TABS tablet TAKE 1 TABLET BY MOUTH TWICE A DAY 60 tablet 5  . furosemide (LASIX) 40 MG tablet Take 1 tablet (40 mg total) by mouth every other day. 45 tablet 3  . gabapentin (NEURONTIN) 300 MG capsule TAKE ONE CAPSULE TWICE DAILY AND 2 AT NIGHT = FOUR TOTAL DAILY. 360 capsule 1  . Glycopyrrolate-Formoterol (BEVESPI AEROSPHERE) 9-4.8 MCG/ACT AERO Inhale 2 puffs into the lungs in the morning and at bedtime.    Marland Kitchen ipratropium (ATROVENT) 0.02 % nebulizer solution Take 2.5 mLs (0.5 mg total) by nebulization every 6 (six) hours as needed for wheezing or shortness of breath. 75 mL 11  . levothyroxine (SYNTHROID) 125 MCG tablet TAKE 1 TABLET (125 MCG TOTAL) BY MOUTH DAILY BEFORE BREAKFAST. 90 tablet 1  . metoprolol tartrate (LOPRESSOR) 25 MG tablet TAKE 1 TABLET BY MOUTH EVERY DAY 90 tablet 2  . Multiple Vitamins-Minerals (PRESERVISION AREDS 2 PO)  Take 1 capsule by mouth daily.     . pantoprazole (PROTONIX) 40 MG tablet Take 1 tablet (40 mg total) by mouth 2 (two) times daily. 180 tablet 4  . potassium chloride (KLOR-CON) 10 MEQ tablet Take 1 tablet 10 meq every other day with Lasix. 45 tablet 3  . pravastatin (PRAVACHOL) 40 MG tablet TAKE 1 TABLET BY MOUTH DAILY. FOLLOW-UP APPT W/LABS ARE DUE MUST SEE PROVIDER FOR FUTURE REFILLS 90 tablet 0  . PROLIA 60 MG/ML SOSY injection Inject 60 mg into the skin every 6 (six) months.     . umeclidinium-vilanterol (ANORO ELLIPTA) 62.5-25 MCG/INH AEPB INHALE 1 PUFF BY MOUTH EVERY DAY 60 each 5   No facility-administered medications prior to visit.      Review of Systems  Review of Systems   Physical Exam  BP 120/64 (BP Location: Right Arm, Cuff Size: Normal)   Pulse 61   Temp 98.8 F (37.1 C) (Other (Comment)) Comment (Src): wrist  Ht 5\' 4"  (1.626 m)   Wt 138 lb  12.8 oz (63 kg)   SpO2 100%   BMI 23.82 kg/m  Physical Exam Constitutional:      General: She is not in acute distress.    Appearance: Normal appearance. She is normal weight.  HENT:     Head: Normocephalic and atraumatic.     Mouth/Throat:     Comments: Deferred d/t masking Cardiovascular:     Rate and Rhythm: Normal rate and regular rhythm.  Pulmonary:     Effort: Pulmonary effort is normal. No respiratory distress.     Breath sounds: No stridor. Wheezing, rhonchi and rales present.  Musculoskeletal:        General: Normal range of motion.     Comments: In WC  Skin:    General: Skin is warm and dry.  Neurological:     General: No focal deficit present.     Mental Status: She is alert and oriented to person, place, and time. Mental status is at baseline.  Psychiatric:        Mood and Affect: Mood normal.        Behavior: Behavior normal.        Thought Content: Thought content normal.        Judgment: Judgment normal.      Lab Results:  CBC    Component Value Date/Time   WBC 5.5 05/21/2020 0845   WBC 7.7 04/07/2020 1457   RBC 3.65 (L) 05/21/2020 0845   HGB 9.4 (L) 05/21/2020 0845   HGB 11.7 08/20/2019 1022   HCT 31.6 (L) 05/21/2020 0845   HCT 35.6 08/20/2019 1022   PLT 142 (L) 05/21/2020 0845   PLT 148 (L) 08/20/2019 1022   MCV 86.6 05/21/2020 0845   MCV 86 08/20/2019 1022   MCH 25.8 (L) 05/21/2020 0845   MCHC 29.7 (L) 05/21/2020 0845   RDW 13.9 05/21/2020 0845   RDW 12.7 08/20/2019 1022   LYMPHSABS 1.7 05/21/2020 0845   LYMPHSABS 1.6 08/20/2019 1022   MONOABS 1.0 05/21/2020 0845   EOSABS 0.3 05/21/2020 0845   EOSABS 0.3 08/20/2019 1022   BASOSABS 0.1 05/21/2020 0845   BASOSABS 0.1 08/20/2019 1022    BMET    Component Value Date/Time   NA 143 05/21/2020 0845   NA 141 10/01/2019 1137   K 4.0 05/21/2020 0845   CL 102 05/21/2020 0845   CO2 33 (H) 05/21/2020 0845   GLUCOSE 100 (H) 05/21/2020 0845   BUN 19 05/21/2020 0845  BUN 18 10/01/2019 1137    CREATININE 1.25 (H) 05/21/2020 0845   CREATININE 0.88 03/04/2020 1152   CALCIUM 8.7 (L) 05/21/2020 0845   GFRNONAA 43 (L) 05/21/2020 0845   GFRNONAA 60 03/04/2020 1152   GFRAA 70 03/04/2020 1152    BNP    Component Value Date/Time   BNP 248.9 (H) 02/12/2020 0745    ProBNP    Component Value Date/Time   PROBNP 220.0 (H) 01/29/2020 1024    Imaging: DG Chest 2 View  Result Date: 06/03/2020 CLINICAL DATA:  COPD with acute exacerbation. EXAM: CHEST - 2 VIEW COMPARISON:  03/03/2020.  CT 05/21/2020 FINDINGS: Stable position of the left dual chamber cardiac pacemaker. Heart size is stable. Again noted is a prominent hiatal hernia. Stable lucency in the lungs compatible with underlying emphysema. Chronic densities in the right lower lung. Diffuse atherosclerotic calcifications in the aorta. No new airspace disease or lung consolidation. No large pleural effusions. Again noted is a compression fracture involving L1 vertebral body. IMPRESSION: Chronic lung changes without acute findings. Electronically Signed   By: Markus Daft M.D.   On: 06/03/2020 10:20   CT Chest W Contrast  Result Date: 05/21/2020 CLINICAL DATA:  Primary Cancer Type: Lung Imaging Indication: Routine surveillance Interval therapy since last imaging? No Initial Cancer Diagnosis Date: 10/14/2018; Established by: Biopsy-proven Detailed Pathology: Stage IA non-small cell lung cancer, poorly differentiated squamous cell carcinoma. Primary Tumor location: Right lower lobe. Surgeries: Pacemaker. Chemotherapy: No Immunotherapy? No Radiation therapy? Yes; Date Range: 10/30/2018 - 11/06/2018; Target: Right lower lung. EXAM: CT CHEST WITH CONTRAST TECHNIQUE: Multidetector CT imaging of the chest was performed during intravenous contrast administration. CONTRAST:  16mL OMNIPAQUE IOHEXOL 300 MG/ML  SOLN COMPARISON:  Most recent CT chest 11/18/2019.  09/25/2018 PET-CT. FINDINGS: Cardiovascular: Advanced aortic and branch vessel atherosclerosis.  Tortuous thoracic aorta. Normal heart size, without pericardial effusion. Multivessel coronary artery atherosclerosis. Pacer. No central pulmonary embolism, on this non-dedicated study. Mediastinum/Nodes: No supraclavicular adenopathy. Prominent but not pathologically sized middle mediastinal nodes are similar, favored to be reactive. No hilar adenopathy. Moderate hiatal hernia. Lungs/Pleura: No pleural fluid.  Advanced bullous type emphysema. Right lower lobe slightly progressive consolidation with architectural distortion indicative of radiation induced fibrosis. No residual primary nodule identified. Medial left lower lobe airspace and ground-glass opacity, including on 81/5 is new. Somewhat more focal, nodular opacity immediately medially is favored to represent consolidation including on 77/5 at 1.0 cm. The majority of solid pulmonary nodules (on the order of 3-4 mm) are similar. A new lateral right upper lobe 4 mm nodule on 43/5. Right apical ground-glass nodule of 5 mm on 28/5 is similar to on the prior exam (when remeasured). Upper Abdomen: Layering gallstones. Favor altered perfusion in the anterior aspect of the pericholecystic liver. Normal imaged portions of the spleen, kidneys. Pancreatic atrophy. Mild bilateral adrenal thickening suggesting hyperplasia. Musculoskeletal: Osteopenia. Severe L1 compression deformity with mild ventral canal encroachment, similar. IMPRESSION: 1. Slight increase in right lower lobe presumably radiation induced consolidation, without residual disease. 2. Left lower lobe new airspace and ground-glass opacity which may also be radiation induced, depending on the distribution of therapy. Alternatively, if radiation change is not favored clinically, infection would be a concern. 3. No thoracic adenopathy. 4. Primarily similar pulmonary nodules. A 4 mm right upper lobe nodule is new and warrants follow-up attention. Similarly, a right apical ground-glass nodule is unchanged but  was hypermetabolic on prior PET and therefore warrants attention. 5. Aortic atherosclerosis (ICD10-I70.0), coronary artery atherosclerosis and emphysema (  ICD10-J43.9). 6. Hiatal hernia. 7. Cholelithiasis. Electronically Signed   By: Abigail Miyamoto M.D.   On: 05/21/2020 11:34   CUP PACEART REMOTE DEVICE CHECK  Result Date: 05/26/2020 Scheduled remote reviewed. Normal device function.  16 AMS events; longest 3 hours 14 minutes; AF burden <1%. On apixaban. Next remote 91 days.    Assessment & Plan:   COPD exacerbation ([place]) - Productive cough x 2 weeks with associated SOB, chest tightness and wheezing. Hx aspiration pneumonia, not following precautions. LS with coarse wheezing and rhonchi. Patient received CPT in office and Depo-medrol 80mg  IM x 1 - Advised patient to Continue Bevespi as prescribed, use Albuterol nebulized scheduled TID followed by flutter valve, mucinex 1,200mg  twice daily. Strict aspiration precautions, out of bed for meals  - CXR today showed chronic findings, no acute process - Sending in prednisone taper (40mg  x 3 days. 30mg  x 3 days, 20mg  x 3, 10mg  x 3 days) and Augmentin 1 tab twice daily x 7 days - Follow-up in 1 weeks      Martyn Ehrich, NP 06/03/2020

## 2020-06-04 ENCOUNTER — Telehealth: Payer: Self-pay

## 2020-06-04 NOTE — Telephone Encounter (Signed)
Submitted VOB, Monovisc, right knee.

## 2020-06-08 ENCOUNTER — Ambulatory Visit (HOSPITAL_COMMUNITY)
Admission: RE | Admit: 2020-06-08 | Payer: PPO | Source: Ambulatory Visit | Attending: Cardiovascular Disease | Admitting: Cardiovascular Disease

## 2020-06-10 DIAGNOSIS — J441 Chronic obstructive pulmonary disease with (acute) exacerbation: Secondary | ICD-10-CM | POA: Diagnosis not present

## 2020-06-14 ENCOUNTER — Ambulatory Visit: Payer: PPO | Admitting: Primary Care

## 2020-06-14 ENCOUNTER — Telehealth: Payer: Self-pay | Admitting: Primary Care

## 2020-06-14 DIAGNOSIS — J9611 Chronic respiratory failure with hypoxia: Secondary | ICD-10-CM | POA: Diagnosis not present

## 2020-06-14 DIAGNOSIS — C3491 Malignant neoplasm of unspecified part of right bronchus or lung: Secondary | ICD-10-CM | POA: Diagnosis not present

## 2020-06-14 DIAGNOSIS — I5031 Acute diastolic (congestive) heart failure: Secondary | ICD-10-CM | POA: Diagnosis not present

## 2020-06-14 DIAGNOSIS — J441 Chronic obstructive pulmonary disease with (acute) exacerbation: Secondary | ICD-10-CM | POA: Diagnosis not present

## 2020-06-14 DIAGNOSIS — J439 Emphysema, unspecified: Secondary | ICD-10-CM | POA: Diagnosis not present

## 2020-06-14 NOTE — Telephone Encounter (Signed)
Called and spoke with pts daughter, [name].  She stated that her mother is still not any better.  She finished the abx and prednisone that was given at her last OV on 06/03/2020 with EW.  She stated that her mother is still having loose stools x 3 a day at least.  She had to go over this morning and help her mother stand as she was not able to.  She stated that her mother has not been running a fever.  She is sleeping more than usual.  The cough and SHOB is about the same for her. She asked her mother about going to the ER this morning and she said that she was not going, she only wanted to go back to bed.  pts daughter is asking for further recs from [place].  Please advise. Thanks  Allergies  Allergen Reactions  . Silver Sulfadiazine Other (See Comments)    lowers white blood count    Current Outpatient Medications on File Prior to Visit  Medication Sig Dispense Refill  . acetaminophen (TYLENOL) 500 MG tablet Take 1,000 mg by mouth every 6 (six) hours as needed for mild pain.     Marland Kitchen amoxicillin-clavulanate (AUGMENTIN) 875-125 MG tablet Take 1 tablet by mouth 2 (two) times daily. 14 tablet 0  . Calcium-Magnesium-Vitamin D (CALCIUM 1200+D3 PO) Take 1 tablet by mouth at bedtime.     . clonazePAM (KLONOPIN) 0.5 MG tablet TAKE 1 TABLET BY MOUTH EVERYDAY AT BEDTIME 30 tablet 0  . docusate sodium (COLACE) 100 MG capsule Take 100 mg by mouth at bedtime.     . donepezil (ARICEPT) 10 MG tablet TAKE 1 TABLET BY MOUTH EVERYDAY AT BEDTIME 90 tablet 2  . DULoxetine (CYMBALTA) 60 MG capsule TAKE 1 CAPSULE BY MOUTH EVERY DAY 90 capsule 0  . ELIQUIS 2.5 MG TABS tablet TAKE 1 TABLET BY MOUTH TWICE A DAY 60 tablet 5  . furosemide (LASIX) 40 MG tablet Take 1 tablet (40 mg total) by mouth every other day. 45 tablet 3  . gabapentin (NEURONTIN) 300 MG capsule TAKE ONE CAPSULE TWICE DAILY AND 2 AT NIGHT = FOUR TOTAL DAILY. 360 capsule 1  . Glycopyrrolate-Formoterol (BEVESPI AEROSPHERE) 9-4.8 MCG/ACT AERO Inhale 2 puffs into  the lungs in the morning and at bedtime.    Marland Kitchen ipratropium (ATROVENT) 0.02 % nebulizer solution Take 2.5 mLs (0.5 mg total) by nebulization every 6 (six) hours as needed for wheezing or shortness of breath. 75 mL 11  . levothyroxine (SYNTHROID) 125 MCG tablet TAKE 1 TABLET (125 MCG TOTAL) BY MOUTH DAILY BEFORE BREAKFAST. 90 tablet 1  . metoprolol tartrate (LOPRESSOR) 25 MG tablet TAKE 1 TABLET BY MOUTH EVERY DAY 90 tablet 2  . Multiple Vitamins-Minerals (PRESERVISION AREDS 2 PO) Take 1 capsule by mouth daily.     . pantoprazole (PROTONIX) 40 MG tablet Take 1 tablet (40 mg total) by mouth 2 (two) times daily. 180 tablet 4  . potassium chloride (KLOR-CON) 10 MEQ tablet Take 1 tablet 10 meq every other day with Lasix. 45 tablet 3  . pravastatin (PRAVACHOL) 40 MG tablet TAKE 1 TABLET BY MOUTH DAILY. FOLLOW-UP APPT W/LABS ARE DUE MUST SEE PROVIDER FOR FUTURE REFILLS 90 tablet 0  . predniSONE (DELTASONE) 10 MG tablet Take 4 tabs po daily x 3 days; then 3 tabs daily x3 days; then 2 tabs daily x3 days; then 1 tab daily x 3 days; then stop 30 tablet 0  . PROLIA 60 MG/ML SOSY injection Inject 60 mg  into the skin every 6 (six) months.      No current facility-administered medications on file prior to visit.

## 2020-06-14 NOTE — Telephone Encounter (Signed)
Called and spoke with pt's daughter [name] letting her the info per [facility]. [name] verbalized understanding and stated that pt was feeling some better compared to overnight. Stated to her that if she gets worse again that she will need to go to ED and she verbalized understanding. Nothing further needed.

## 2020-06-14 NOTE — Progress Notes (Deleted)
@Patient  ID: Brandi Raymond, female    DOB: [birth year], 86 y.o.   MRN: [id number]  No chief complaint on file.   Referring provider: Binnie Rail, MD  HPI:  86 year old female, former smoker. PMH significant COPD, pulmonary chronic respiratory failure. 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 Patient of Dr. Vaughan Browner. Maintained on [place].   Previous [facility] pulmonary encounters: 01/29/2020- Hosp FU Recently hospitalized from 01/11/20-01/13/20 for COPD exacerbation, CAP RLL, acute on chronic respiratory failure. Infiltrate seen on CXR. Initially treted with vancomycin, ceftriaxone, azithromycin. She was started on prednisone 40mg  x 5 days and Atrovent nebulizer prn. Transitioned to Cefdinir and azithromycin for discharge to complete 5 days antibiotic therapy. She was weaned to home oxygen 2L. Patient present today for hospital follow-up COPD exacerbation/CAP. Accompanied by family menber. He is doing ok. She has some wheezing and dyspnea when she over exerts her self. She is on 2L oxygen.  She gets lightheaded at times if she moves too quick. She felt faint coming into the office today. She is on lasix 40mg  daily and metoprolol 25mg  daily. Blood pressure standing in right arm was 112/60 today. She is eating and drinking normally. She likes to drink coffee and pepsi.  Cough has improved some, occasionally gets up some mucus. She is using Atrovent once in awhile at home when she has congestion. She used it yesterday. Afebrile since hospitalization.   03/03/2020 - Hosp FU Patient presents today for 2-4 week follow-up. She was admitted on 02/12/20 for COPD exacerbation/aspiration pneumonia. Treated with IV cefepime, azithromycin and solu-medrol. Repeat CXR on 02/15/20 showed near complete resolution of pneumonia involving right lower lobe superimposed on chronic scarring. She was discharged on oral omnicef and prednisone taper.    She is  feeling better, mostly back to her baseline. No fever or chills. She does have a cough, mostly clearing her throat. She will occasionally cough up clear mucus. She is on [employer], takes one puff daily. Inhaler did cost 45 dollars but she is now in the donut hole.   Modified barium swallow showed mild oropharyngeal dysphagia, delayed swallow initiation. Recommend regular soild/thin liquids, frequent use of throat clear to help remove any penetration from laryngeal vestibule. Pt will need 1:1 supervision with PO intake, as she could not demonstrate use of throat clear independently when drinking.   06/03/2020 Patient presents today for acute office visit with reports of Cough x 2 weeks. Cough is productive with clear-yellow mucus. Associated chest tightness, shortness of breath and wheezing. Last hospital admission was in July 2021 for COPD exacerbation. Since last visit Anoro was changed to [facility] by our pharmacy team d/t cost. She is compliant with Bevespi use. She started using her flutter valve again yesterday. She has not been following aspiration precautions. Overdoing it at home per her daughter. Her husband is in the hospital currently following a cardiac procedure. She received her covid vaccines but not influenza or booster. She is not interested at this time.  06/14/2020- Interim hx  Patient presents today for 1-2 week follow-up COPD exacerbation. Treated with depo-medrol 80mg  IM injection in office, Augmentin 1 tab twice daily x 1 week and prednisone taper 40mg  x 3 days, 30mg  x 3 days, 20mg  x 3 days, 10mg  x 3 days. CXR showed chronic findings of emphysema and densities right lower lung, no acute process.      Allergies  Allergen Reactions  . Silver Sulfadiazine Other (  See Comments)    lowers white blood count     Immunization History  Administered Date(s) Administered  . Fluad Quad(high Dose 65+) 04/18/2019  . Influenza, High Dose Seasonal PF 04/09/2014, 06/23/2015, 06/11/2017,  05/10/2018  . Influenza, Seasonal, Injecte, Preservative Fre 06/20/2012  . Influenza,inj,Quad PF,6+ Mos 05/22/2013  . Janssen (J&J) SARS-COV-2 Vaccination 10/25/2019  . Pneumococcal Conjugate-13 12/06/2016  . Pneumococcal Polysaccharide-23 09/26/2013, 08/13/2015  . Tdap 11/13/2012    Past Medical History:  Diagnosis Date  . Adenomatous colon polyp   . Arthritis   . CAD (coronary artery disease)    PTCA of RCA   . Carotid artery occlusion   . Cervical spine fracture ([place])   . COPD (chronic obstructive pulmonary disease) ([place])   . Depression   . Diverticulosis   . Gastritis 09/15/1991  . GERD (gastroesophageal reflux disease) 09/15/1991   Dr Sharlett Iles  . Hiatal hernia 09/15/1991  . Hip fracture, right ([place])   . HLD (hyperlipidemia)   . HTN (hypertension)   . Hyperplastic polyps of stomach 11/2007   colonoscopy  . Hypertension   . Hypothyroidism    affecting the left eye, proptosis  . Iron deficiency anemia   . Lung cancer ([place])    s/p XRT  . Macular degeneration of left eye   . Memory loss   . Mesenteric artery stenosis ([place])   . Myocardial infarction ([place]) 1993  . Ocular myasthenia gravis ([place])    Dr Jannifer Franklin  . Orthostatic hypotension 06/05/2013  . PONV (postoperative nausea and vomiting)   . Strabismus    left eye  . Syncope 1998    Tobacco History: Social History   Tobacco Use  Smoking Status Former Smoker  . Packs/day: 1.00  . Years: 60.00  . Pack years: 60.00  . Types: Cigarettes  . Quit date: 07/2019  . Years since quitting: 0.9  Smokeless Tobacco Never Used  Tobacco Comment   Successfully quit   Counseling given: Not Answered Comment: Successfully quit   Outpatient Medications Prior to Visit  Medication Sig Dispense Refill  . acetaminophen (TYLENOL) 500 MG tablet Take 1,000 mg by mouth every 6 (six) hours as needed for mild pain.     Marland Kitchen amoxicillin-clavulanate (AUGMENTIN) 875-125 MG tablet Take 1 tablet by mouth 2 (two) times daily. 14 tablet 0  .  Calcium-Magnesium-Vitamin D (CALCIUM 1200+D3 PO) Take 1 tablet by mouth at bedtime.     . clonazePAM (KLONOPIN) 0.5 MG tablet TAKE 1 TABLET BY MOUTH EVERYDAY AT BEDTIME 30 tablet 0  . docusate sodium (COLACE) 100 MG capsule Take 100 mg by mouth at bedtime.     . donepezil (ARICEPT) 10 MG tablet TAKE 1 TABLET BY MOUTH EVERYDAY AT BEDTIME 90 tablet 2  . DULoxetine (CYMBALTA) 60 MG capsule TAKE 1 CAPSULE BY MOUTH EVERY DAY 90 capsule 0  . ELIQUIS 2.5 MG TABS tablet TAKE 1 TABLET BY MOUTH TWICE A DAY 60 tablet 5  . furosemide (LASIX) 40 MG tablet Take 1 tablet (40 mg total) by mouth every other day. 45 tablet 3  . gabapentin (NEURONTIN) 300 MG capsule TAKE ONE CAPSULE TWICE DAILY AND 2 AT NIGHT = FOUR TOTAL DAILY. 360 capsule 1  . Glycopyrrolate-Formoterol (BEVESPI AEROSPHERE) 9-4.8 MCG/ACT AERO Inhale 2 puffs into the lungs in the morning and at bedtime.    Marland Kitchen ipratropium (ATROVENT) 0.02 % nebulizer solution Take 2.5 mLs (0.5 mg total) by nebulization every 6 (six) hours as needed for wheezing or shortness of breath. 75 mL 11  .  levothyroxine (SYNTHROID) 125 MCG tablet TAKE 1 TABLET (125 MCG TOTAL) BY MOUTH DAILY BEFORE BREAKFAST. 90 tablet 1  . metoprolol tartrate (LOPRESSOR) 25 MG tablet TAKE 1 TABLET BY MOUTH EVERY DAY 90 tablet 2  . Multiple Vitamins-Minerals (PRESERVISION AREDS 2 PO) Take 1 capsule by mouth daily.     . pantoprazole (PROTONIX) 40 MG tablet Take 1 tablet (40 mg total) by mouth 2 (two) times daily. 180 tablet 4  . potassium chloride (KLOR-CON) 10 MEQ tablet Take 1 tablet 10 meq every other day with Lasix. 45 tablet 3  . pravastatin (PRAVACHOL) 40 MG tablet TAKE 1 TABLET BY MOUTH DAILY. FOLLOW-UP APPT W/LABS ARE DUE MUST SEE PROVIDER FOR FUTURE REFILLS 90 tablet 0  . predniSONE (DELTASONE) 10 MG tablet Take 4 tabs po daily x 3 days; then 3 tabs daily x3 days; then 2 tabs daily x3 days; then 1 tab daily x 3 days; then stop 30 tablet 0  . PROLIA 60 MG/ML SOSY injection Inject 60 mg into  the skin every 6 (six) months.      No facility-administered medications prior to visit.      Review of Systems  Review of Systems   Physical Exam  There were no vitals taken for this visit. Physical Exam   Lab Results:  CBC    Component Value Date/Time   WBC 5.5 05/21/2020 0845   WBC 7.7 04/07/2020 1457   RBC 3.65 (L) 05/21/2020 0845   HGB 9.4 (L) 05/21/2020 0845   HGB 11.7 08/20/2019 1022   HCT 31.6 (L) 05/21/2020 0845   HCT 35.6 08/20/2019 1022   PLT 142 (L) 05/21/2020 0845   PLT 148 (L) 08/20/2019 1022   MCV 86.6 05/21/2020 0845   MCV 86 08/20/2019 1022   MCH 25.8 (L) 05/21/2020 0845   MCHC 29.7 (L) 05/21/2020 0845   RDW 13.9 05/21/2020 0845   RDW 12.7 08/20/2019 1022   LYMPHSABS 1.7 05/21/2020 0845   LYMPHSABS 1.6 08/20/2019 1022   MONOABS 1.0 05/21/2020 0845   EOSABS 0.3 05/21/2020 0845   EOSABS 0.3 08/20/2019 1022   BASOSABS 0.1 05/21/2020 0845   BASOSABS 0.1 08/20/2019 1022    BMET    Component Value Date/Time   NA 143 05/21/2020 0845   NA 141 10/01/2019 1137   K 4.0 05/21/2020 0845   CL 102 05/21/2020 0845   CO2 33 (H) 05/21/2020 0845   GLUCOSE 100 (H) 05/21/2020 0845   BUN 19 05/21/2020 0845   BUN 18 10/01/2019 1137   CREATININE 1.25 (H) 05/21/2020 0845   CREATININE 0.88 03/04/2020 1152   CALCIUM 8.7 (L) 05/21/2020 0845   GFRNONAA 43 (L) 05/21/2020 0845   GFRNONAA 60 03/04/2020 1152   GFRAA 70 03/04/2020 1152    BNP    Component Value Date/Time   BNP 248.9 (H) 02/12/2020 0745    ProBNP    Component Value Date/Time   PROBNP 220.0 (H) 01/29/2020 1024    Imaging: DG Chest 2 View  Result Date: 06/03/2020 CLINICAL DATA:  COPD with acute exacerbation. EXAM: CHEST - 2 VIEW COMPARISON:  03/03/2020.  CT 05/21/2020 FINDINGS: Stable position of the left dual chamber cardiac pacemaker. Heart size is stable. Again noted is a prominent hiatal hernia. Stable lucency in the lungs compatible with underlying emphysema. Chronic densities in the  right lower lung. Diffuse atherosclerotic calcifications in the aorta. No new airspace disease or lung consolidation. No large pleural effusions. Again noted is a compression fracture involving L1 vertebral body. IMPRESSION: Chronic  lung changes without acute findings. Electronically Signed   By: Markus Daft M.D.   On: 06/03/2020 10:20   CT Chest W Contrast  Result Date: 05/21/2020 CLINICAL DATA:  Primary Cancer Type: Lung Imaging Indication: Routine surveillance Interval therapy since last imaging? No Initial Cancer Diagnosis Date: 10/14/2018; Established by: Biopsy-proven Detailed Pathology: Stage IA non-small cell lung cancer, poorly differentiated squamous cell carcinoma. Primary Tumor location: Right lower lobe. Surgeries: Pacemaker. Chemotherapy: No Immunotherapy? No Radiation therapy? Yes; Date Range: 10/30/2018 - 11/06/2018; Target: Right lower lung. EXAM: CT CHEST WITH CONTRAST TECHNIQUE: Multidetector CT imaging of the chest was performed during intravenous contrast administration. CONTRAST:  65mL OMNIPAQUE IOHEXOL 300 MG/ML  SOLN COMPARISON:  Most recent CT chest 11/18/2019.  09/25/2018 PET-CT. FINDINGS: Cardiovascular: Advanced aortic and branch vessel atherosclerosis. Tortuous thoracic aorta. Normal heart size, without pericardial effusion. Multivessel coronary artery atherosclerosis. Pacer. No central pulmonary embolism, on this non-dedicated study. Mediastinum/Nodes: No supraclavicular adenopathy. Prominent but not pathologically sized middle mediastinal nodes are similar, favored to be reactive. No hilar adenopathy. Moderate hiatal hernia. Lungs/Pleura: No pleural fluid.  Advanced bullous type emphysema. Right lower lobe slightly progressive consolidation with architectural distortion indicative of radiation induced fibrosis. No residual primary nodule identified. Medial left lower lobe airspace and ground-glass opacity, including on 81/5 is new. Somewhat more focal, nodular opacity immediately  medially is favored to represent consolidation including on 77/5 at 1.0 cm. The majority of solid pulmonary nodules (on the order of 3-4 mm) are similar. A new lateral right upper lobe 4 mm nodule on 43/5. Right apical ground-glass nodule of 5 mm on 28/5 is similar to on the prior exam (when remeasured). Upper Abdomen: Layering gallstones. Favor altered perfusion in the anterior aspect of the pericholecystic liver. Normal imaged portions of the spleen, kidneys. Pancreatic atrophy. Mild bilateral adrenal thickening suggesting hyperplasia. Musculoskeletal: Osteopenia. Severe L1 compression deformity with mild ventral canal encroachment, similar. IMPRESSION: 1. Slight increase in right lower lobe presumably radiation induced consolidation, without residual disease. 2. Left lower lobe new airspace and ground-glass opacity which may also be radiation induced, depending on the distribution of therapy. Alternatively, if radiation change is not favored clinically, infection would be a concern. 3. No thoracic adenopathy. 4. Primarily similar pulmonary nodules. A 4 mm right upper lobe nodule is new and warrants follow-up attention. Similarly, a right apical ground-glass nodule is unchanged but was hypermetabolic on prior PET and therefore warrants attention. 5. Aortic atherosclerosis (ICD10-I70.0), coronary artery atherosclerosis and emphysema (ICD10-J43.9). 6. Hiatal hernia. 7. Cholelithiasis. Electronically Signed   By: Abigail Miyamoto M.D.   On: 05/21/2020 11:34   CUP PACEART REMOTE DEVICE CHECK  Result Date: 05/26/2020 Scheduled remote reviewed. Normal device function.  16 AMS events; longest 3 hours 14 minutes; AF burden <1%. On apixaban. Next remote 91 days.    Assessment & Plan:   No problem-specific Assessment & Plan notes found for this encounter.     Martyn Ehrich, NP 06/14/2020

## 2020-06-14 NOTE — Telephone Encounter (Signed)
She had an appointment this morning for a follow-up but it was cancelled. If she is having difficulty standing and is more fatigued she go to ED for evaluation. Strongly recommend this.

## 2020-06-16 ENCOUNTER — Encounter: Payer: Self-pay | Admitting: Primary Care

## 2020-06-16 ENCOUNTER — Ambulatory Visit: Payer: PPO | Admitting: Primary Care

## 2020-06-16 ENCOUNTER — Ambulatory Visit (INDEPENDENT_AMBULATORY_CARE_PROVIDER_SITE_OTHER): Payer: PPO

## 2020-06-16 ENCOUNTER — Telehealth: Payer: Self-pay

## 2020-06-16 ENCOUNTER — Telehealth: Payer: Self-pay | Admitting: Primary Care

## 2020-06-16 ENCOUNTER — Other Ambulatory Visit: Payer: Self-pay

## 2020-06-16 VITALS — BP 124/80 | HR 83 | Temp 97.5°F | Ht 64.0 in | Wt 136.4 lb

## 2020-06-16 DIAGNOSIS — I517 Cardiomegaly: Secondary | ICD-10-CM | POA: Diagnosis not present

## 2020-06-16 DIAGNOSIS — J439 Emphysema, unspecified: Secondary | ICD-10-CM

## 2020-06-16 DIAGNOSIS — J441 Chronic obstructive pulmonary disease with (acute) exacerbation: Secondary | ICD-10-CM

## 2020-06-16 DIAGNOSIS — C3491 Malignant neoplasm of unspecified part of right bronchus or lung: Secondary | ICD-10-CM

## 2020-06-16 DIAGNOSIS — R0602 Shortness of breath: Secondary | ICD-10-CM | POA: Diagnosis not present

## 2020-06-16 DIAGNOSIS — J029 Acute pharyngitis, unspecified: Secondary | ICD-10-CM

## 2020-06-16 DIAGNOSIS — J9611 Chronic respiratory failure with hypoxia: Secondary | ICD-10-CM | POA: Diagnosis not present

## 2020-06-16 DIAGNOSIS — Z7189 Other specified counseling: Secondary | ICD-10-CM | POA: Diagnosis not present

## 2020-06-16 DIAGNOSIS — J811 Chronic pulmonary edema: Secondary | ICD-10-CM | POA: Diagnosis not present

## 2020-06-16 DIAGNOSIS — K449 Diaphragmatic hernia without obstruction or gangrene: Secondary | ICD-10-CM | POA: Diagnosis not present

## 2020-06-16 LAB — STREP COMPLETE PANEL
Strep Pyogenes: NEGATIVE
Strep dysgalactiae: NEGATIVE

## 2020-06-16 MED ORDER — METHYLPREDNISOLONE ACETATE 80 MG/ML IJ SUSP: 80.0000 mg | Freq: Once | INTRAMUSCULAR | Status: DC

## 2020-06-16 MED ORDER — PREDNISONE 10 MG PO TABS: ORAL_TABLET | ORAL | 0 refills | Status: DC

## 2020-06-16 NOTE — Assessment & Plan Note (Addendum)
-   Treated for AECOPD on 06/03/20. She initially improved some after being on course of oral Augmentin and prednisone taper. She started to decline again 3 days ago. Cough is productive with dark colored mucus, associated wheezing. She has a hx of aspiration pneumonia and does not follow-up aspiration risk precautions. She refused ED evaluation. Received depo-medrol 80mg  IM x1. Recommend repeating CXR today. Sending in RX prednisone taper 40mg  x 3 days; 30mg  x 3 days; 20mg  x 3 days then stay on 10mg  daily until follow-up  - FU televisit in 1 week and OV with Dr. Vaughan Browner first available

## 2020-06-16 NOTE — Progress Notes (Signed)
Results sent to pt via mychart since she send email to [place] today asking for them. Asked that she let us know if she has any questions or concerns.

## 2020-06-16 NOTE — Assessment & Plan Note (Signed)
-   Likely form coughing. Posterior pharynx with some erythema and dry mucus. Checking throat culture. Continue mucinex and encourage oral hydration.

## 2020-06-16 NOTE — Patient Instructions (Addendum)
Office treatment: - Depo-medrol 80mg  IM x1 today  Orders: - CXR today re: COPD exacerbation/ aspiration   Recommendations: - Continue Bevepsi 2 puffs twice daily for now (we do not have samples of the other inhaler) - Use flutter valve three times a day - Recommend getting out of bed daily for 20-30 mins - Follow soft solid diet (NO chips, crackers, cookies- nothing crumbly). DO not eat lying down in bed.  - Please discuss goals of care/code status with family   Rx: - Prednisone taper as prescribed; then stay on 10mg  daily  Refer: - Palliative care : re COPD   Follow-up: - Televisit in 1 week with Beth; AND first available with Dr. Vaughan Browner (Dec 13th or 14th) - If not getting better needs to present to ED

## 2020-06-16 NOTE — Progress Notes (Signed)
@Patient  ID: Brandi Raymond, female    DOB: 01-25-1934, 86 y.o.   MRN: [id number]  Chief Complaint  Patient presents with  . Follow-up    COPD, cough, hoarse    Referring provider: Binnie Rail, MD  HPI:  86 year old female, former smoker. PMH significant COPD, pulmonary chronic respiratory failure. 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 Patient of Dr. Vaughan Browner. Maintained on [place].   Previous [facility] pulmonary encounters: 01/29/2020- Hosp FU Recently hospitalized from 01/11/20-01/13/20 for COPD exacerbation, CAP RLL, acute on chronic respiratory failure. Infiltrate seen on CXR. Initially treted with vancomycin, ceftriaxone, azithromycin. She was started on prednisone 40mg  x 5 days and Atrovent nebulizer prn. Transitioned to Cefdinir and azithromycin for discharge to complete 5 days antibiotic therapy. She was weaned to home oxygen 2L. Patient present today for hospital follow-up COPD exacerbation/CAP. Accompanied by family menber. He is doing ok. She has some wheezing and dyspnea when she over exerts her self. She is on 2L oxygen.  She gets lightheaded at times if she moves too quick. She felt faint coming into the office today. She is on lasix 40mg  daily and metoprolol 25mg  daily. Blood pressure standing in right arm was 112/60 today. She is eating and drinking normally. She likes to drink coffee and pepsi.  Cough has improved some, occasionally gets up some mucus. She is using Atrovent once in awhile at home when she has congestion. She used it yesterday. Afebrile since hospitalization.   03/03/2020 - Hosp FU Patient presents today for 2-4 week follow-up. She was admitted on 02/12/20 for COPD exacerbation/aspiration pneumonia. Treated with IV cefepime, azithromycin and solu-medrol. Repeat CXR on 02/15/20 showed near complete resolution of pneumonia involving right lower lobe superimposed on chronic scarring. She was discharged on  oral omnicef and prednisone taper.    She is feeling better, mostly back to her baseline. No fever or chills. She does have a cough, mostly clearing her throat. She will occasionally cough up clear mucus. She is on [employer], takes one puff daily. Inhaler did cost 45 dollars but she is now in the donut hole.   Modified barium swallow showed mild oropharyngeal dysphagia, delayed swallow initiation. Recommend regular soild/thin liquids, frequent use of throat clear to help remove any penetration from laryngeal vestibule. Pt will need 1:1 supervision with PO intake, as she could not demonstrate use of throat clear independently when drinking.   06/03/2020- Acute visit, COPD exac Patient presents today for acute office visit with reports of Cough x 2 weeks. Cough is productive with clear-yellow mucus. Associated chest tightness, shortness of breath and wheezing. Last hospital admission was in July 2021 for COPD exacerbation. Since last visit Anoro was changed to [facility] by our pharmacy team d/t cost. She is compliant with Bevespi use. She started using her flutter valve again yesterday. She has not been following aspiration precautions. Overdoing it at home per her daughter. Her husband is in the hospital currently following a cardiac procedure. She received her covid vaccines but not influenza or booster. She is not interested at this time.  06/16/2020 - Interim hx Patient presents today for 2 week follow-up. She was treated for COPD exacerbations with course of Augmentin and prednisone taper. Patient had a follow-up scheduled for 06/14/20 but this was canceled. Her daughter called stating that she was not feeling well with symptoms or cough, weakness and fatigued. Advised ED evaluation but daughter states that she was feeling  better.   She continues to have a productive cough. Cough is productive with dark brown color. No hemoptysis. Associated sore throat, voice hoarseness and weakness. She also  complaints of stomach pain, this eases after bowel movement. She has been having loose stools. Completed Augmentin course and prednisone. She was feeling well up until 3 days ago. Daughter states that she was up walking around and then became weak/wobbly. They have been needing to go up to 3L oxygen occasionally, reports O2 will drop down to 87% on 2L. She has had physical therapy at home in the past but did not wish to participate in therapy.   Daughter states that patient coughs every time she eats. She eats a lot of potato chips. Does not get out of bed for meals. She had a speech and swallow eval in the past that showed mild oropharyngeal dysphagia, delayed swallow initiation. They recommended 1:1 observation.     Allergies  Allergen Reactions  . Silver Sulfadiazine Other (See Comments)    lowers white blood count     Immunization History  Administered Date(s) Administered  . Fluad Quad(high Dose 65+) 04/18/2019  . Influenza, High Dose Seasonal PF 04/09/2014, 06/23/2015, 06/11/2017, 05/10/2018  . Influenza, Seasonal, Injecte, Preservative Fre 06/20/2012  . Influenza,inj,Quad PF,6+ Mos 05/22/2013  . Janssen (J&J) SARS-COV-2 Vaccination 10/25/2019  . Pneumococcal Conjugate-13 12/06/2016  . Pneumococcal Polysaccharide-23 09/26/2013, 08/13/2015  . Tdap 11/13/2012    Past Medical History:  Diagnosis Date  . Adenomatous colon polyp   . Arthritis   . CAD (coronary artery disease)    PTCA of RCA   . Carotid artery occlusion   . Cervical spine fracture ([place])   . COPD (chronic obstructive pulmonary disease) ([place])   . Depression   . Diverticulosis   . Gastritis 09/15/1991  . GERD (gastroesophageal reflux disease) 09/15/1991   Dr Sharlett Iles  . Hiatal hernia 09/15/1991  . Hip fracture, right ([place])   . HLD (hyperlipidemia)   . HTN (hypertension)   . Hyperplastic polyps of stomach 11/2007   colonoscopy  . Hypertension   . Hypothyroidism    affecting the left eye, proptosis  . Iron  deficiency anemia   . Lung cancer ([place])    s/p XRT  . Macular degeneration of left eye   . Memory loss   . Mesenteric artery stenosis ([place])   . Myocardial infarction ([place]) 1993  . Ocular myasthenia gravis ([place])    Dr Jannifer Franklin  . Orthostatic hypotension 06/05/2013  . PONV (postoperative nausea and vomiting)   . Strabismus    left eye  . Syncope 1998    Tobacco History: Social History   Tobacco Use  Smoking Status Former Smoker  . Packs/day: 1.00  . Years: 60.00  . Pack years: 60.00  . Types: Cigarettes  . Quit date: 07/2019  . Years since quitting: 0.9  Smokeless Tobacco Never Used  Tobacco Comment   Successfully quit   Counseling given: Not Answered Comment: Successfully quit   Outpatient Medications Prior to Visit  Medication Sig Dispense Refill  . acetaminophen (TYLENOL) 500 MG tablet Take 1,000 mg by mouth every 6 (six) hours as needed for mild pain.     . Calcium-Magnesium-Vitamin D (CALCIUM 1200+D3 PO) Take 1 tablet by mouth at bedtime.     . clonazePAM (KLONOPIN) 0.5 MG tablet TAKE 1 TABLET BY MOUTH EVERYDAY AT BEDTIME 30 tablet 0  . docusate sodium (COLACE) 100 MG capsule Take 100 mg by mouth at bedtime.     Marland Kitchen  donepezil (ARICEPT) 10 MG tablet TAKE 1 TABLET BY MOUTH EVERYDAY AT BEDTIME 90 tablet 2  . DULoxetine (CYMBALTA) 60 MG capsule TAKE 1 CAPSULE BY MOUTH EVERY DAY 90 capsule 0  . ELIQUIS 2.5 MG TABS tablet TAKE 1 TABLET BY MOUTH TWICE A DAY 60 tablet 5  . furosemide (LASIX) 40 MG tablet Take 1 tablet (40 mg total) by mouth every other day. 45 tablet 3  . gabapentin (NEURONTIN) 300 MG capsule TAKE ONE CAPSULE TWICE DAILY AND 2 AT NIGHT = FOUR TOTAL DAILY. 360 capsule 1  . Glycopyrrolate-Formoterol (BEVESPI AEROSPHERE) 9-4.8 MCG/ACT AERO Inhale 2 puffs into the lungs in the morning and at bedtime.    Marland Kitchen ipratropium (ATROVENT) 0.02 % nebulizer solution Take 2.5 mLs (0.5 mg total) by nebulization every 6 (six) hours as needed for wheezing or shortness of breath. 75  mL 11  . levothyroxine (SYNTHROID) 125 MCG tablet TAKE 1 TABLET (125 MCG TOTAL) BY MOUTH DAILY BEFORE BREAKFAST. 90 tablet 1  . metoprolol tartrate (LOPRESSOR) 25 MG tablet TAKE 1 TABLET BY MOUTH EVERY DAY 90 tablet 2  . Multiple Vitamins-Minerals (PRESERVISION AREDS 2 PO) Take 1 capsule by mouth daily.     . pantoprazole (PROTONIX) 40 MG tablet Take 1 tablet (40 mg total) by mouth 2 (two) times daily. 180 tablet 4  . potassium chloride (KLOR-CON) 10 MEQ tablet Take 1 tablet 10 meq every other day with Lasix. 45 tablet 3  . pravastatin (PRAVACHOL) 40 MG tablet TAKE 1 TABLET BY MOUTH DAILY. FOLLOW-UP APPT W/LABS ARE DUE MUST SEE PROVIDER FOR FUTURE REFILLS 90 tablet 0  . PROLIA 60 MG/ML SOSY injection Inject 60 mg into the skin every 6 (six) months.     Marland Kitchen amoxicillin-clavulanate (AUGMENTIN) 875-125 MG tablet Take 1 tablet by mouth 2 (two) times daily. 14 tablet 0  . predniSONE (DELTASONE) 10 MG tablet Take 4 tabs po daily x 3 days; then 3 tabs daily x3 days; then 2 tabs daily x3 days; then 1 tab daily x 3 days; then stop 30 tablet 0   No facility-administered medications prior to visit.    Review of Systems  Review of Systems  Respiratory: Positive for cough.   Neurological: Positive for weakness.   Physical Exam  BP 124/80 (BP Location: Left Arm, Cuff Size: Normal)   Pulse 83   Temp (!) 97.5 F (36.4 C) (Oral)   Ht 5\' 4"  (1.626 m)   Wt 136 lb 6.4 oz (61.9 kg)   SpO2 93%   BMI 23.41 kg/m  Physical Exam Constitutional:      Appearance: Normal appearance.     Comments: Frail elderly female, chronically ill appearing  HENT:     Head: Normocephalic and atraumatic.     Mouth/Throat:     Comments: Deferred d/t masking Cardiovascular:     Rate and Rhythm: Normal rate and regular rhythm.  Pulmonary:     Effort: Pulmonary effort is normal.     Breath sounds: Wheezing and rhonchi present.     Comments: Rhonchi right base Musculoskeletal:     Comments: In WC  Skin:    General:  Skin is dry.     Findings: Bruising present.  Neurological:     General: No focal deficit present.     Mental Status: She is alert and oriented to person, place, and time. Mental status is at baseline.     Motor: Weakness present.  Psychiatric:        Mood and Affect: Mood normal.  Behavior: Behavior normal.        Thought Content: Thought content normal.        Judgment: Judgment normal.      Lab Results:  CBC    Component Value Date/Time   WBC 5.5 05/21/2020 0845   WBC 7.7 04/07/2020 1457   RBC 3.65 (L) 05/21/2020 0845   HGB 9.4 (L) 05/21/2020 0845   HGB 11.7 08/20/2019 1022   HCT 31.6 (L) 05/21/2020 0845   HCT 35.6 08/20/2019 1022   PLT 142 (L) 05/21/2020 0845   PLT 148 (L) 08/20/2019 1022   MCV 86.6 05/21/2020 0845   MCV 86 08/20/2019 1022   MCH 25.8 (L) 05/21/2020 0845   MCHC 29.7 (L) 05/21/2020 0845   RDW 13.9 05/21/2020 0845   RDW 12.7 08/20/2019 1022   LYMPHSABS 1.7 05/21/2020 0845   LYMPHSABS 1.6 08/20/2019 1022   MONOABS 1.0 05/21/2020 0845   EOSABS 0.3 05/21/2020 0845   EOSABS 0.3 08/20/2019 1022   BASOSABS 0.1 05/21/2020 0845   BASOSABS 0.1 08/20/2019 1022    BMET    Component Value Date/Time   NA 143 05/21/2020 0845   NA 141 10/01/2019 1137   K 4.0 05/21/2020 0845   CL 102 05/21/2020 0845   CO2 33 (H) 05/21/2020 0845   GLUCOSE 100 (H) 05/21/2020 0845   BUN 19 05/21/2020 0845   BUN 18 10/01/2019 1137   CREATININE 1.25 (H) 05/21/2020 0845   CREATININE 0.88 03/04/2020 1152   CALCIUM 8.7 (L) 05/21/2020 0845   GFRNONAA 43 (L) 05/21/2020 0845   GFRNONAA 60 03/04/2020 1152   GFRAA 70 03/04/2020 1152    BNP    Component Value Date/Time   BNP 248.9 (H) 02/12/2020 0745    ProBNP    Component Value Date/Time   PROBNP 220.0 (H) 01/29/2020 1024    Imaging: DG Chest 2 View  Result Date: 06/03/2020 CLINICAL DATA:  COPD with acute exacerbation. EXAM: CHEST - 2 VIEW COMPARISON:  03/03/2020.  CT 05/21/2020 FINDINGS: Stable position of the  left dual chamber cardiac pacemaker. Heart size is stable. Again noted is a prominent hiatal hernia. Stable lucency in the lungs compatible with underlying emphysema. Chronic densities in the right lower lung. Diffuse atherosclerotic calcifications in the aorta. No new airspace disease or lung consolidation. No large pleural effusions. Again noted is a compression fracture involving L1 vertebral body. IMPRESSION: Chronic lung changes without acute findings. Electronically Signed   By: Markus Daft M.D.   On: 06/03/2020 10:20   CT Chest W Contrast  Result Date: 05/21/2020 CLINICAL DATA:  Primary Cancer Type: Lung Imaging Indication: Routine surveillance Interval therapy since last imaging? No Initial Cancer Diagnosis Date: 10/14/2018; Established by: Biopsy-proven Detailed Pathology: Stage IA non-small cell lung cancer, poorly differentiated squamous cell carcinoma. Primary Tumor location: Right lower lobe. Surgeries: Pacemaker. Chemotherapy: No Immunotherapy? No Radiation therapy? Yes; Date Range: 10/30/2018 - 11/06/2018; Target: Right lower lung. EXAM: CT CHEST WITH CONTRAST TECHNIQUE: Multidetector CT imaging of the chest was performed during intravenous contrast administration. CONTRAST:  69mL OMNIPAQUE IOHEXOL 300 MG/ML  SOLN COMPARISON:  Most recent CT chest 11/18/2019.  09/25/2018 PET-CT. FINDINGS: Cardiovascular: Advanced aortic and branch vessel atherosclerosis. Tortuous thoracic aorta. Normal heart size, without pericardial effusion. Multivessel coronary artery atherosclerosis. Pacer. No central pulmonary embolism, on this non-dedicated study. Mediastinum/Nodes: No supraclavicular adenopathy. Prominent but not pathologically sized middle mediastinal nodes are similar, favored to be reactive. No hilar adenopathy. Moderate hiatal hernia. Lungs/Pleura: No pleural fluid.  Advanced bullous type emphysema.  Right lower lobe slightly progressive consolidation with architectural distortion indicative of radiation  induced fibrosis. No residual primary nodule identified. Medial left lower lobe airspace and ground-glass opacity, including on 81/5 is new. Somewhat more focal, nodular opacity immediately medially is favored to represent consolidation including on 77/5 at 1.0 cm. The majority of solid pulmonary nodules (on the order of 3-4 mm) are similar. A new lateral right upper lobe 4 mm nodule on 43/5. Right apical ground-glass nodule of 5 mm on 28/5 is similar to on the prior exam (when remeasured). Upper Abdomen: Layering gallstones. Favor altered perfusion in the anterior aspect of the pericholecystic liver. Normal imaged portions of the spleen, kidneys. Pancreatic atrophy. Mild bilateral adrenal thickening suggesting hyperplasia. Musculoskeletal: Osteopenia. Severe L1 compression deformity with mild ventral canal encroachment, similar. IMPRESSION: 1. Slight increase in right lower lobe presumably radiation induced consolidation, without residual disease. 2. Left lower lobe new airspace and ground-glass opacity which may also be radiation induced, depending on the distribution of therapy. Alternatively, if radiation change is not favored clinically, infection would be a concern. 3. No thoracic adenopathy. 4. Primarily similar pulmonary nodules. A 4 mm right upper lobe nodule is new and warrants follow-up attention. Similarly, a right apical ground-glass nodule is unchanged but was hypermetabolic on prior PET and therefore warrants attention. 5. Aortic atherosclerosis (ICD10-I70.0), coronary artery atherosclerosis and emphysema (ICD10-J43.9). 6. Hiatal hernia. 7. Cholelithiasis. Electronically Signed   By: Abigail Miyamoto M.D.   On: 05/21/2020 11:34   CUP PACEART REMOTE DEVICE CHECK  Result Date: 05/26/2020 Scheduled remote reviewed. Normal device function.  16 AMS events; longest 3 hours 14 minutes; AF burden <1%. On apixaban. Next remote 91 days.    Assessment & Plan:   COPD exacerbation ([place]) - Treated for  AECOPD on 06/03/20. She initially improved some after being on course of oral Augmentin and prednisone taper. She started to decline again 3 days ago. Cough is productive with dark colored mucus, associated wheezing. She has a hx of aspiration pneumonia and does not follow-up aspiration risk precautions. She refused ED evaluation. Received depo-medrol 80mg  IM x1. Recommend repeating CXR today. Sending in RX prednisone taper 40mg  x 3 days; 30mg  x 3 days; 20mg  x 3 days then stay on 10mg  daily until follow-up  - FU televisit in 1 week and OV with Dr. Vaughan Browner first available   Stage I squamous cell carcinoma of right lung ([place]) - She saw oncology on 05/25/20, she is under observation with them for hx stage 1A non-small cell lung cancer, squamous cell carcinoma right lower lobe. S/p SBRT under the care of Dr. Sondra Come which she completed in April 2020.   Goals of care, counseling/discussion - Patient is a full code, however, occasionally declines care and goes against medical recommendations. Started discussion about goals of care today. She does not want to be intubated but was unsure about whether on not she would want to be resuscitated. Recommend referring to palliative care d/t  Severe COPD, patient and family agreeing. Advised she discuss code status with her family and follow-up with Dr. Vaughan Browner at next visit.   Chronic respiratory failure with hypoxia (HCC) - O2 93% RA at rest  -Titrate oxygen between 2-3 L to keep O2 >88%  Sore throat - Likely form coughing. Posterior pharynx with some erythema and dry mucus. Checking throat culture. Continue mucinex and encourage oral hydration.    Martyn Ehrich, NP 06/16/2020

## 2020-06-16 NOTE — Progress Notes (Signed)
Strep negative.

## 2020-06-16 NOTE — Progress Notes (Signed)
CXR showed chronic changes/scarring consistent with smoking hx and COPD. No acute process. Strep was negative. No abx at this time as she just completed Augmentin. Recommend prednisone taper as prescribed. She does have a moderate-large hiatal hernia. She wouldn't likely be a surgical candidate so nothing to be done about this except for managing GERD. She could see GI.

## 2020-06-16 NOTE — Assessment & Plan Note (Signed)
-   O2 93% RA at rest  -Titrate oxygen between 2-3 L to keep O2 >88%

## 2020-06-16 NOTE — Telephone Encounter (Signed)
Patient's daughter sent a mychart message that her mother was having 10/10 abdominal pain. Called daughter and patient is having multiple issues. The pain in her stomach is worse post-prandial. Upon further investigation, patient has been on a 2 week course of antibiotics/steroids for a lung issue - possible pneumonia. She has been having black tarry stools for 2 weeks and daughter reported some coffee ground emesis in the trash can. She has sob/and getting progressively weaker. I told the daughter my concern of internal bleeding in addition to the lung issue. Patient staunchly refuses hospital stating (per daughter) "it's not so bad" I advised there was little we could do, and the ED was the next course of action - if patient has continued refusal, daughter will consider palliative care.

## 2020-06-16 NOTE — Assessment & Plan Note (Addendum)
-   Patient is a full code, however, occasionally declines care and goes against medical recommendations. Started discussion about goals of care today. She does not want to be intubated but was unsure about whether on not she would want to be resuscitated. Recommend referring to palliative care d/t  Severe COPD, patient and family agreeing. Advised she discuss code status with her family and follow-up with Dr. Vaughan Browner at next visit.

## 2020-06-16 NOTE — Assessment & Plan Note (Addendum)
-   She saw oncology on 05/25/20, she is under observation with them for hx stage 1A non-small cell lung cancer, squamous cell carcinoma right lower lobe. S/p SBRT under the care of Dr. Sondra Come which she completed in April 2020.

## 2020-06-17 ENCOUNTER — Other Ambulatory Visit: Payer: Self-pay

## 2020-06-17 DIAGNOSIS — K551 Chronic vascular disorders of intestine: Secondary | ICD-10-CM

## 2020-06-17 NOTE — Telephone Encounter (Signed)
ATC x1.  Left message with spouse to return call.  Spouse did not know what information the daughter needed.

## 2020-06-18 ENCOUNTER — Other Ambulatory Visit: Payer: Self-pay

## 2020-06-18 ENCOUNTER — Telehealth: Payer: Self-pay

## 2020-06-18 ENCOUNTER — Ambulatory Visit: Payer: PPO | Admitting: Vascular Surgery

## 2020-06-18 ENCOUNTER — Encounter: Payer: Self-pay | Admitting: Vascular Surgery

## 2020-06-18 ENCOUNTER — Ambulatory Visit (HOSPITAL_COMMUNITY)
Admission: RE | Admit: 2020-06-18 | Discharge: 2020-06-18 | Disposition: A | Discharge status: Home / Self Care | Payer: PPO | Source: Ambulatory Visit | Attending: Vascular Surgery | Admitting: Vascular Surgery

## 2020-06-18 VITALS — BP 100/62 | HR 87 | Temp 97.9°F | Resp 24 | Ht 64.0 in | Wt 136.0 lb

## 2020-06-18 DIAGNOSIS — K551 Chronic vascular disorders of intestine: Secondary | ICD-10-CM | POA: Diagnosis not present

## 2020-06-18 LAB — CULTURE, GROUP A STREP
MICRO NUMBER:: 11263197
SPECIMEN QUALITY:: ADEQUATE
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
## 2020-06-18 NOTE — Telephone Encounter (Signed)
Patient seen in office today by Dr Donzetta Matters.

## 2020-06-18 NOTE — Telephone Encounter (Signed)
LVM for pt to call back to schedule.

## 2020-06-18 NOTE — Telephone Encounter (Signed)
Approved for Monovisc-Right knee Dr. Frederik Pear and [facility] No copay 20% OOP No prior auth required   Appointment after 07/09/20

## 2020-06-21 ENCOUNTER — Other Ambulatory Visit: Payer: Self-pay | Admitting: Neurology

## 2020-06-22 ENCOUNTER — Other Ambulatory Visit: Payer: Self-pay

## 2020-06-22 DIAGNOSIS — K551 Chronic vascular disorders of intestine: Secondary | ICD-10-CM

## 2020-06-22 NOTE — Telephone Encounter (Signed)
Called and spoke to pt's husband. He thinks nothing is needed at this time and whatever the reason for the intital call has been resolved. Pt has a televisit tomorrow 12.8.21 with Derl Barrow, NP. Pt and spouse aware to keep appt. Nothing further needed at this time.

## 2020-06-23 ENCOUNTER — Encounter: Payer: Self-pay | Admitting: Primary Care

## 2020-06-23 ENCOUNTER — Other Ambulatory Visit: Payer: Self-pay

## 2020-06-23 ENCOUNTER — Ambulatory Visit (INDEPENDENT_AMBULATORY_CARE_PROVIDER_SITE_OTHER): Payer: PPO | Admitting: Primary Care

## 2020-06-23 DIAGNOSIS — J9611 Chronic respiratory failure with hypoxia: Secondary | ICD-10-CM | POA: Diagnosis not present

## 2020-06-23 DIAGNOSIS — J441 Chronic obstructive pulmonary disease with (acute) exacerbation: Secondary | ICD-10-CM

## 2020-06-23 MED ORDER — CEFDINIR 300 MG PO CAPS: 300.0000 mg | ORAL_CAPSULE | Freq: Two times a day (BID) | ORAL | 0 refills | Status: DC

## 2020-06-23 NOTE — Patient Instructions (Signed)
We will send in RX for omnicef 300mg  twice daily x 7 days  Continue Bevespi as prescribed, mucinex twice daily and flutter valve  She has a follow-up scheduled on 06/28/20 at 11:45 with Dr. Vaughan Browner

## 2020-06-23 NOTE — Progress Notes (Signed)
Virtual Visit via Telephone Note  I connected with Brandi Raymond on 06/23/20 at 10:00 AM EST by telephone and verified that I am speaking with the correct person using two identifiers.  Location: Patient: Home Provider: Office   I discussed the limitations, risks, security and privacy concerns of performing an evaluation and management service by telephone and the availability of in person appointments. I also discussed with the patient that there may be a patient responsible charge related to this service. The patient expressed understanding and agreed to proceed.   History of Present Illness:  86 year old female, former smoker. PMH significant COPD, pulmonary chronic respiratory failure. 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 Patient of Dr. Vaughan Browner. Maintained on [place].   Previous [facility] pulmonary encounters: 01/29/2020- Hosp FU Recently hospitalized from 01/11/20-01/13/20 for COPD exacerbation, CAP RLL, acute on chronic respiratory failure. Infiltrate seen on CXR. Initially treted with vancomycin, ceftriaxone, azithromycin. She was started on prednisone 40mg  x 5 days and Atrovent nebulizer prn. Transitioned to Cefdinir and azithromycin for discharge to complete 5 days antibiotic therapy. She was weaned to home oxygen 2L. Patient present today for hospital follow-up COPD exacerbation/CAP. Accompanied by family menber. He is doing ok. She has some wheezing and dyspnea when she over exerts her self. She is on 2L oxygen.  She gets lightheaded at times if she moves too quick. She felt faint coming into the office today. She is on lasix 40mg  daily and metoprolol 25mg  daily. Blood pressure standing in right arm was 112/60 today. She is eating and drinking normally. She likes to drink coffee and pepsi.  Cough has improved some, occasionally gets up some mucus. She is using Atrovent once in awhile at home when she has congestion. She used it  yesterday. Afebrile since hospitalization.   03/03/2020 - Hosp FU Patient presents today for 2-4 week follow-up. She was admitted on 02/12/20 for COPD exacerbation/aspiration pneumonia. Treated with IV cefepime, azithromycin and solu-medrol. Repeat CXR on 02/15/20 showed near complete resolution of pneumonia involving right lower lobe superimposed on chronic scarring. She was discharged on oral omnicef and prednisone taper.    She is feeling better, mostly back to her baseline. No fever or chills. She does have a cough, mostly clearing her throat. She will occasionally cough up clear mucus. She is on [employer], takes one puff daily. Inhaler did cost 45 dollars but she is now in the donut hole.   Modified barium swallow showed mild oropharyngeal dysphagia, delayed swallow initiation. Recommend regular soild/thin liquids, frequent use of throat clear to help remove any penetration from laryngeal vestibule. Pt will need 1:1 supervision with PO intake, as she could not demonstrate use of throat clear independently when drinking.   06/03/2020- Acute visit, COPD exac Patient presents today for acute office visit with reports of Cough x 2 weeks. Cough is productive with clear-yellow mucus. Associated chest tightness, shortness of breath and wheezing. Last hospital admission was in July 2021 for COPD exacerbation. Since last visit Anoro was changed to [facility] by our pharmacy team d/t cost. She is compliant with Bevespi use. She started using her flutter valve again yesterday. She has not been following aspiration precautions. Overdoing it at home per her daughter. Her husband is in the hospital currently following a cardiac procedure. She received her covid vaccines but not influenza or booster. She is not interested at this time.  06/16/2020 - Acute visit, AECOPD Patient presents today for  2 week follow-up. She was treated for COPD exacerbations with course of Augmentin and prednisone taper. Patient had a  follow-up scheduled for 06/14/20 but this was canceled. Her daughter called stating that she was not feeling well with symptoms or cough, weakness and fatigued. Advised ED evaluation but daughter states that she was feeling better.   She continues to have a productive cough. Cough is productive with dark brown color. No hemoptysis. Associated sore throat, voice hoarseness and weakness. She also complaints of stomach pain, this eases after bowel movement. She has been having loose stools. Completed Augmentin course and prednisone. She was feeling well up until 3 days ago. Daughter states that she was up walking around and then became weak/wobbly. They have been needing to go up to 3L oxygen occasionally, reports O2 will drop down to 87% on 2L. She has had physical therapy at home in the past but did not wish to participate in therapy.   Daughter states that patient coughs every time she eats. She eats a lot of potato chips. Does not get out of bed for meals. She had a speech and swallow eval in the past that showed mild oropharyngeal dysphagia, delayed swallow initiation. They recommended 1:1 observation.    06/23/2020-  1 WEEK FU, AECOPD  Patient contacted today for 1 week fu televisit. She was treated for acute COPD exacerbation back in 06/03/20. She initially improved some after completing course of oral Augmentin and prednisone taper. Symptoms than progressively declined again. She had a productive cough with dark colored mucus and associated wheezing and weakness. We gave her a depo-medro shot during offive visit. Repeat CXR showed chronic interstitial thickening and right lung scarring consistent with smoking hx. No acute process. We sent in prednisone taper and asked her to remain on 10mg  until follow-up. She has a hx of stage 1 squamous cell carcinoma right lung, s/p SBRT under the care od Dr. Sondra Come which she completed in April 2020. She last saw oncology on 05/25/20 and is currently under observation.  We started goal of care discussion during last visit, encourage patient to speak with her family and address code status at next visit. I referred her to palliative care d/t severe COPD.   Speaking with her daughter, [name], today for fu televisit. She had a small fall at home on Monday, no injuries sustained. Her daughter feels that some days her Mom will look like she is getting better and other days she does not. She is still coughing up some dark tan/grey mucus. Continues to have voice hoarseness. She has bene using Bevespi twice daily. She is still taking mucinex and using flutter. She is getting out of bed for meals. Slept well last night, her husband states that she had a "good night". Denies f/c/s, chest pain, N/V/D.   Observations/Objective:  Patient just woke up, speaking with Daughter [name] today for televisit   Patient reported: - O2 87% 2L; 93% 3L  Assessment and Plan:  COPD exacerbation: - Treated for AECOPD in November and December. CXR 06/16/20 showed no acute process. She still has congested cough with dark grey/tan mucus. She remains on prednisone taper and will then stay on 10mg  daily until follow-up next week. We will send in RX for omnicef 300mg  twice daily x 7 days. Continue Bevespi as prescribed, mucinex twice daily and flutter valve. She has a follow-up in 5 days with Dr. Vaughan Browner.    Chronic respiratory failure - O2 87% on 2L; improved to 93% on 3L -  Advised patient to stay on 3L continuously   Goals of care: - Referred to pallative care at last visit d/t severe COPD   Follow Up Instructions:   - She has a fu scheduled on 06/28/20 with Dr. Vaughan Browner  I discussed the assessment and treatment plan with the patient. The patient was provided an opportunity to ask questions and all were answered. The patient agreed with the plan and demonstrated an understanding of the instructions.   The patient was advised to call back or seek an in-person evaluation if the symptoms  worsen or if the condition fails to improve as anticipated.  I provided 30 minutes of non-face-to-face time during this encounter.   Martyn Ehrich, NP

## 2020-06-28 ENCOUNTER — Other Ambulatory Visit: Payer: Self-pay

## 2020-06-28 ENCOUNTER — Encounter: Payer: Self-pay | Admitting: Pulmonary Disease

## 2020-06-28 ENCOUNTER — Ambulatory Visit (INDEPENDENT_AMBULATORY_CARE_PROVIDER_SITE_OTHER): Payer: PPO

## 2020-06-28 ENCOUNTER — Ambulatory Visit: Payer: PPO | Admitting: Pulmonary Disease

## 2020-06-28 VITALS — BP 132/80 | HR 110 | Temp 97.3°F | Ht 64.0 in | Wt 135.8 lb

## 2020-06-28 DIAGNOSIS — R0602 Shortness of breath: Secondary | ICD-10-CM

## 2020-06-28 DIAGNOSIS — J449 Chronic obstructive pulmonary disease, unspecified: Secondary | ICD-10-CM

## 2020-06-28 DIAGNOSIS — K449 Diaphragmatic hernia without obstruction or gangrene: Secondary | ICD-10-CM | POA: Diagnosis not present

## 2020-06-28 DIAGNOSIS — J9611 Chronic respiratory failure with hypoxia: Secondary | ICD-10-CM

## 2020-06-28 DIAGNOSIS — R918 Other nonspecific abnormal finding of lung field: Secondary | ICD-10-CM | POA: Diagnosis not present

## 2020-06-28 NOTE — Progress Notes (Signed)
Brandi Raymond    [id number]    October 26, 1933  Primary Care Physician:Burns, Claudina Lick, MD  Referring Physician: Binnie Rail, MD [place],   [id number]  Chief complaint: Follow-up for severe COPD  HPI: 86 year old with history of hypertension, coronary artery disease, recent diagnosis in March 2020 of stage Ia squamous cell cancer status post SBRT.  Referred here for evaluation of emphysema.  Severe COPD on PFTs.    Pets: No pets Occupation: Used to work in Primary school teacher and and [employer] Exposures: No known exposures, no mold, hot tub, Jacuzzi Smoking history: 15-pack-year smoker.  Quit smoking in early 2021 Travel history: No significant travel history Relevant family history: No significant family history of lung disease  Interim history: Was hospitalized in June 2021 earlier this year for COPD exacerbation, CAP.  Noted to have mild oropharyngeal dysphagia and was seen by speech therapy. Over the past few months she has had multiple clinic visits for COPD exacerbations requiring rounds of antibiotics, prednisone  She was initially on anoro, this was changed to bevespi She is using nebs as needed Had a small fall at home a few weeks ago.  Did not lose consciousness.  Now complains of vague rib pain.  Outpatient Encounter Medications as of 06/28/2020  Medication Sig  . acetaminophen (TYLENOL) 500 MG tablet Take 1,000 mg by mouth every 6 (six) hours as needed for mild pain.   . Calcium-Magnesium-Vitamin D (CALCIUM 1200+D3 PO) Take 1 tablet by mouth at bedtime.   . cefdinir (OMNICEF) 300 MG capsule Take 1 capsule (300 mg total) by mouth 2 (two) times daily.  . clonazePAM (KLONOPIN) 0.5 MG tablet TAKE 1 TABLET BY MOUTH EVERYDAY AT BEDTIME  . docusate sodium (COLACE) 100 MG capsule Take 100 mg by mouth at bedtime.   . donepezil (ARICEPT) 10 MG tablet TAKE 1 TABLET BY MOUTH EVERYDAY AT BEDTIME  . DULoxetine (CYMBALTA) 60 MG capsule TAKE 1 CAPSULE BY  MOUTH EVERY DAY  . ELIQUIS 2.5 MG TABS tablet TAKE 1 TABLET BY MOUTH TWICE A DAY  . furosemide (LASIX) 40 MG tablet Take 1 tablet (40 mg total) by mouth every other day.  . gabapentin (NEURONTIN) 300 MG capsule TAKE ONE CAPSULE TWICE DAILY AND 2 AT NIGHT = FOUR TOTAL DAILY.  Marland Kitchen Glycopyrrolate-Formoterol (BEVESPI AEROSPHERE) 9-4.8 MCG/ACT AERO Inhale 2 puffs into the lungs in the morning and at bedtime.  Marland Kitchen ipratropium (ATROVENT) 0.02 % nebulizer solution Take 2.5 mLs (0.5 mg total) by nebulization every 6 (six) hours as needed for wheezing or shortness of breath.  . levothyroxine (SYNTHROID) 125 MCG tablet TAKE 1 TABLET (125 MCG TOTAL) BY MOUTH DAILY BEFORE BREAKFAST.  . metoprolol tartrate (LOPRESSOR) 25 MG tablet TAKE 1 TABLET BY MOUTH EVERY DAY  . Multiple Vitamins-Minerals (PRESERVISION AREDS 2 PO) Take 1 capsule by mouth daily.   . pantoprazole (PROTONIX) 40 MG tablet Take 1 tablet (40 mg total) by mouth 2 (two) times daily.  . potassium chloride (KLOR-CON) 10 MEQ tablet Take 1 tablet 10 meq every other day with Lasix.  Marland Kitchen pravastatin (PRAVACHOL) 40 MG tablet TAKE 1 TABLET BY MOUTH DAILY. FOLLOW-UP APPT W/LABS ARE DUE MUST SEE PROVIDER FOR FUTURE REFILLS  . predniSONE (DELTASONE) 10 MG tablet Take 4 tabs po daily x 3 days; then 3 tabs daily x3 days; then 2 tabs daily x3 days; then 1 tab daily until follow-up  . PROLIA 60 MG/ML SOSY injection Inject 60 mg  into the skin every 6 (six) months.    Facility-Administered Encounter Medications as of 06/28/2020  Medication  . methylPREDNISolone acetate (DEPO-MEDROL) injection 80 mg    Physical Exam: Blood pressure 132/80, pulse (!) 110, temperature (!) 97.3 F (36.3 C), temperature source Skin, height 5\' 4"  (1.626 m), weight 135 lb 12.8 oz (61.6 kg), SpO2 92 %. Gen:      No acute distress HEENT:  EOMI, sclera anicteric Neck:     No masses; no thyromegaly Lungs:    Clear to auscultation bilaterally; normal respiratory effort CV:         Regular  rate and rhythm; no murmurs Abd:      + bowel sounds; soft, non-tender; no palpable masses, no distension Ext:    No edema; adequate peripheral perfusion Skin:      Warm and dry; no rash Neuro: alert and oriented x 3 Psych: normal mood and affect  Data Reviewed: Imaging: CT chest 09/03/2018- lobulated mass in the superior segment of the right lower lobe measuring 2 cm.  Atherosclerosis, emphysema.  PET scan 09/25/18-2 cm right lower lobe pulmonary nodule is hypermetabolic.  Scattered bilateral soft 8 mm ill-defined lung nodules with low uptake.  CT chest 05/21/2020-progressive right lower lobe consolidation, left lower lobe airspace disease with groundglass, stable pulmonary nodules. I have reviewed the images personally.  PFTs: 05/21/2019 FVC 1.81 [73%], FEV1 0.95 [51%], F/F 53, TLC 4.61 [91%], DLCO 7.34 [39%] Moderate-severe obstruction with severe diffusion defect   Labs: CT-guided biopsy 10/14/2018-Poorly differentiated squamous cell cancer  CBC 09/17/2018-WBC 11.3, eos 1%, absolute eosinophil count 113  Assessment:  Severe COPD, GOLD D Has multiple recent exacerbations We will hold prednisone at 10 mg for long-term maintenance to help stabilize her symptoms Continue supplemental oxygen, bevespi and nebs as needed  Lung cancer status post radiation therapy Getting regular follow-up scans with oncology  Rib pain Had a fall recently.  Chest x-ray two-view to evaluate fracture.  Goals of care Discussed goals of care with patient and daughter They have confirmed that CODE STATUS is DNR Palliative care referral made.  Plan/Recommendations: - Bevespi - Keep prednisone at 10 mg a day - Chest x-ray - Palliative care referral  Marshell Garfinkel MD [facility] 06/28/2020, 12:25 PM  CC: Binnie Rail, MD

## 2020-06-28 NOTE — Patient Instructions (Signed)
Continue the bevespi inhaler We will keep you on prednisone at a constant dose of 10 mg a day Continue supplemental oxygen  Make referral to palliative care Get chest x-ray PA and lateral today  Follow-up in 1 to 2 months.

## 2020-06-29 ENCOUNTER — Encounter: Payer: Self-pay | Admitting: Internal Medicine

## 2020-06-29 ENCOUNTER — Telehealth: Payer: Self-pay | Admitting: Pulmonary Disease

## 2020-06-29 ENCOUNTER — Telehealth: Payer: Self-pay

## 2020-06-29 MED ORDER — PREDNISONE 10 MG PO TABS: 10.0000 mg | ORAL_TABLET | Freq: Every day | ORAL | 3 refills | Status: DC

## 2020-06-29 NOTE — Progress Notes (Signed)
Subjective:    Patient ID: Brandi Raymond, female    DOB: [birth year], 86 y.o.   MRN: [id number]  HPI The patient is here for follow up of their chronic medical problems, including CAD, htn, OP on prolia, COPD on oxygen, lung cancer s/p radiation, hypothyroidism, prediabetes, hyperlipidemia, sleep d/o, depression and memory issues  She has fallen twice - she has pain in her lower back. The pain is midline but radiates laterally on both sides. She fell 6 days ago and a couple of days prior to that. The pain can be severe at times. She does have a history of compression fractures in her spine. She is currently on Prolia.    She takes tylenol.    She does feel she is improving from her recent COPD exacerbation her breathing is better. She is now on 3 L of oxygen via nasal cannula and is on 10 mg of prednisone chronically.  Medications and allergies reviewed with patient and updated if appropriate.  Patient Active Problem List   Diagnosis Date Noted  . Goals of care, counseling/discussion 06/16/2020  . Sore throat 06/16/2020  . Large hiatal hernia 04/07/2020  . Dysphagia 04/07/2020  . Pacemaker 03/16/2020  . Chronic anticoagulation 03/16/2020  . Encounter for medication review and counseling 03/15/2020  . Gastritis with hemorrhage 03/04/2020  . PNA (pneumonia) 02/12/2020  . Right lower lobe pneumonia 01/13/2020  . CAP (community acquired pneumonia) 01/11/2020  . Acute respiratory failure ([place]) 09/02/2019  . Acute diastolic CHF (congestive heart failure) ([place]) 09/02/2019  . Paroxysmal atrial fibrillation (H[facility])   . Second degree Mobitz II AV block 08/21/2019  . Second degree AV block 08/08/2019  . SOB (shortness of breath) 08/05/2019  . Chest tightness 08/05/2019  . GERD (gastroesophageal reflux disease) 05/21/2019  . Chronic respiratory failure with hypoxia ([place]) 05/21/2019  . Medication management 05/21/2019  . Stage I squamous cell carcinoma of right lung ([place]) 02/06/2019  .  Constipation 09/11/2018  . COPD exacerbation ([place]) 09/04/2018  . Right lower lobe lung mass 09/04/2018  . Closed compression fracture of L1 lumbar vertebra, initial encounter ([place]) 09/04/2018  . Preoperative clearance 07/19/2018  . Lower back pain 06/25/2018  . Cubital tunnel syndrome on left 05/22/2018  . Dupuytren's contracture of both hands 05/10/2018  . Primary osteoarthritis of both knees 05/10/2018  . Difficulty urinating 05/10/2018  . Osteoporosis 06/14/2017  . Prediabetes 06/23/2015  . Depression 06/23/2015  . Carotid stenosis 10/12/2014  . Sleep disorder 04/09/2014  . Dizziness 01/20/2013  . Mesenteric artery stenosis ([place]) 01/13/2013  . Thoracic aneurysm without mention of rupture 11/04/2012  . Chronic mesenteric ischemia ([place]) 10/08/2012  . Memory deficit 06/20/2012  . Irritable bowel syndrome 06/20/2012  . Ocular myasthenia gravis ([place]) 06/20/2012  . PVD (peripheral vascular disease) ([place]) 06/20/2012  . Hereditary and idiopathic peripheral neuropathy 05/22/2012  . Syncope 08/28/2011  . Anemia 07/22/2010  . ABDOMINAL BRUIT 08/17/2009  . Coronary atherosclerosis 09/05/2008  . Hypothyroidism 05/26/2008  . VITAMIN D DEFICIENCY 05/26/2008  . CHOLELITHIASIS 12/06/2007  . DIVERTICULOSIS, COLON 12/05/2007  . HYPERLIPIDEMIA 08/19/2007  . COPD (chronic obstructive pulmonary disease) with emphysema ([place]) 08/19/2007  . CIGARETTE SMOKER 02/06/2007  . Essential hypertension 02/06/2007  . COLONIC POLYPS 11/21/2004    Current Outpatient Medications on File Prior to Visit  Medication Sig Dispense Refill  . acetaminophen (TYLENOL) 500 MG tablet Take 1,000 mg by mouth every 6 (six) hours as needed for mild pain.     . Calcium-Magnesium-Vitamin D (CALCIUM 1200+D3  PO) Take 1 tablet by mouth at bedtime.     . clonazePAM (KLONOPIN) 0.5 MG tablet TAKE 1 TABLET BY MOUTH EVERYDAY AT BEDTIME 30 tablet 0  . docusate sodium (COLACE) 100 MG capsule Take 100 mg by mouth at bedtime.     .  donepezil (ARICEPT) 10 MG tablet TAKE 1 TABLET BY MOUTH EVERYDAY AT BEDTIME 90 tablet 2  . DULoxetine (CYMBALTA) 60 MG capsule TAKE 1 CAPSULE BY MOUTH EVERY DAY 90 capsule 0  . ELIQUIS 2.5 MG TABS tablet TAKE 1 TABLET BY MOUTH TWICE A DAY 60 tablet 5  . furosemide (LASIX) 40 MG tablet Take 1 tablet (40 mg total) by mouth every other day. 45 tablet 3  . gabapentin (NEURONTIN) 300 MG capsule TAKE ONE CAPSULE TWICE DAILY AND 2 AT NIGHT = FOUR TOTAL DAILY. 360 capsule 1  . Glycopyrrolate-Formoterol (BEVESPI AEROSPHERE) 9-4.8 MCG/ACT AERO Inhale 2 puffs into the lungs in the morning and at bedtime.    Marland Kitchen ipratropium (ATROVENT) 0.02 % nebulizer solution Take 2.5 mLs (0.5 mg total) by nebulization every 6 (six) hours as needed for wheezing or shortness of breath. 75 mL 11  . levothyroxine (SYNTHROID) 125 MCG tablet TAKE 1 TABLET (125 MCG TOTAL) BY MOUTH DAILY BEFORE BREAKFAST. 90 tablet 1  . metoprolol tartrate (LOPRESSOR) 25 MG tablet TAKE 1 TABLET BY MOUTH EVERY DAY 90 tablet 2  . Multiple Vitamins-Minerals (PRESERVISION AREDS 2 PO) Take 1 capsule by mouth daily.     . pantoprazole (PROTONIX) 40 MG tablet Take 1 tablet (40 mg total) by mouth 2 (two) times daily. 180 tablet 4  . potassium chloride (KLOR-CON) 10 MEQ tablet Take 1 tablet 10 meq every other day with Lasix. 45 tablet 3  . pravastatin (PRAVACHOL) 40 MG tablet TAKE 1 TABLET BY MOUTH DAILY. FOLLOW-UP APPT W/LABS ARE DUE MUST SEE PROVIDER FOR FUTURE REFILLS 90 tablet 0  . predniSONE (DELTASONE) 10 MG tablet Take 1 tablet (10 mg total) by mouth daily with breakfast. 30 tablet 3  . PROLIA 60 MG/ML SOSY injection Inject 60 mg into the skin every 6 (six) months.      Current Facility-Administered Medications on File Prior to Visit  Medication Dose Route Frequency Provider Last Rate Last Admin  . methylPREDNISolone acetate (DEPO-MEDROL) injection 80 mg  80 mg Intramuscular Once Martyn Ehrich, NP        Past Medical History:  Diagnosis Date   . Adenomatous colon polyp   . Arthritis   . CAD (coronary artery disease)    PTCA of RCA   . Carotid artery occlusion   . Cervical spine fracture ([place])   . COPD (chronic obstructive pulmonary disease) ([place])   . Depression   . Diverticulosis   . Gastritis 09/15/1991  . GERD (gastroesophageal reflux disease) 09/15/1991   Dr Sharlett Iles  . Hiatal hernia 09/15/1991  . Hip fracture, right ([place])   . HLD (hyperlipidemia)   . HTN (hypertension)   . Hyperplastic polyps of stomach 11/2007   colonoscopy  . Hypertension   . Hypothyroidism    affecting the left eye, proptosis  . Iron deficiency anemia   . Lung cancer ([place])    s/p XRT  . Macular degeneration of left eye   . Memory loss   . Mesenteric artery stenosis ([place])   . Myocardial infarction ([place]) 1993  . Ocular myasthenia gravis ([place])    Dr Jannifer Franklin  . Orthostatic hypotension 06/05/2013  . PONV (postoperative nausea and vomiting)   . Strabismus  left eye  . Syncope 1998    Past Surgical History:  Procedure Laterality Date  . ABDOMINAL AORTAGRAM N/A 10/15/2012   Procedure: ABDOMINAL Maxcine Ham;  Surgeon: Serafina Mitchell, MD;  Location: [facility] CATH LAB;  Service: Cardiovascular;  Laterality: N/A;  . arm surgery Left    fx  . BALLOON ANGIOPLASTY, ARTERY  1993  . CARDIAC CATHETERIZATION  1996   LAD 20/50, CFX OK, RCA 30 at prev PTCA site, EF with mild HK inferior wall  . CAROTID ANGIOGRAM N/A 10/28/2014   Procedure: CAROTID ANGIOGRAM;  Surgeon: Serafina Mitchell, MD;  Location: [facility] CATH LAB;  Service: Cardiovascular;  Laterality: N/A;  . CAROTID ENDARTERECTOMY    . CATARACT EXTRACTION     bilateral  . COLONOSCOPY W/ POLYPECTOMY  2006   Adenomatous polyps  . ENDARTERECTOMY Left 01/14/2015   Procedure: LEFT CAROTID ENDARTERECTOMY ;  Surgeon: Serafina Mitchell, MD;  Location: [facility];  Service: Vascular;  Laterality: Left;  . ENDARTERECTOMY Right 08/12/2015   Procedure: ENDARTERECTOMY CAROTID WITH PATCH ANGIOPLASTY;  Surgeon: Serafina Mitchell, MD;   Location: [facility];  Service: Vascular;  Laterality: Right;  . EYE MUSCLE SURGERY Left 11/04/2015  . EYE SURGERY Bilateral May 2016   Eyelids  . FOOT SURGERY Left   . LEG SURGERY Left    laceration  . MIDDLE EAR SURGERY Left 1970  . PACEMAKER IMPLANT N/A 08/21/2019   Procedure: PACEMAKER IMPLANT;  Surgeon: [name], MD;  Location: [facility] CV LAB;  Service: Cardiovascular;  Laterality: N/A;  . PERCUTANEOUS STENT INTERVENTION  12/03/2012   Procedure: PERCUTANEOUS STENT INTERVENTION;  Surgeon: Serafina Mitchell, MD;  Location: [facility] CATH LAB;  Service: Cardiovascular;;  sma stent x1  . PERIPHERAL VASCULAR BALLOON ANGIOPLASTY  07/22/2019   Procedure: PERIPHERAL VASCULAR BALLOON ANGIOPLASTY;  Surgeon: Serafina Mitchell, MD;  Location: [facility] CV LAB;  Service: Cardiovascular;;  Superior mesenteric  . STRABISMUS SURGERY Left 10/28/2015   Procedure: REPAIR STRABISMUS LEFT EYE;  Surgeon: Lamonte Sakai, MD;  Location: [facility];  Service: Ophthalmology;  Laterality: Left;  . Third-degree Benn Tarver  2003   [facility]-legs ,buttocks,arms  . TOTAL ABDOMINAL HYSTERECTOMY  1973   Dysfunctional menses  . UPPER GI ENDOSCOPY      Dr Sharlett Iles  . VISCERAL ANGIOGRAM N/A 10/15/2012   Procedure: VISCERAL ANGIOGRAM;  Surgeon: Serafina Mitchell, MD;  Location: [facility] CATH LAB;  Service: Cardiovascular;  Laterality: N/A;  . VISCERAL ANGIOGRAM N/A 12/03/2012   Procedure: VISCERAL ANGIOGRAM;  Surgeon: Serafina Mitchell, MD;  Location: [facility] CATH LAB;  Service: Cardiovascular;  Laterality: N/A;  . VISCERAL ANGIOGRAM N/A 08/05/2013   Procedure: MESENTERIC ANGIOGRAM;  Surgeon: Serafina Mitchell, MD;  Location: [facility] CATH LAB;  Service: Cardiovascular;  Laterality: N/A;  . VISCERAL ANGIOGRAM N/A 10/28/2014   Procedure: VISCERAL ANGIOGRAM;  Surgeon: Serafina Mitchell, MD;  Location: [facility] CATH LAB;  Service: Cardiovascular;  Laterality: N/A;  . VISCERAL ANGIOGRAPHY N/A 07/22/2019   Procedure: MESENTERIC ANGIOGRAPHY;  Surgeon: Serafina Mitchell, MD;   Location: [place] [facility] CV LAB;  Service: Cardiovascular;  Laterality: N/A;    Social History   Socioeconomic History  . Marital status: Married    Spouse name: Not on file  . Number of children: 3  . Years of education: 9th  . Highest education level: Not on file  Occupational History  . Occupation: Retired  Tobacco Use  . Smoking status: Former Smoker    Packs/day: 1.00    Years: 60.00  Pack years: 60.00    Types: Cigarettes    Quit date: 07/2019    Years since quitting: 0.9  . Smokeless tobacco: Never Used  . Tobacco comment: Successfully quit  Vaping Use  . Vaping Use: Never used  Substance and Sexual Activity  . Alcohol use: No    Alcohol/week: 0.0 standard drinks  . Drug use: No  . Sexual activity: Not Currently  Other Topics Concern  . Not on file  Social History Narrative   Patient is right handed.  Lives in [place] with husband.   Patient drinks 3-4 cups of caffeine daily.   Social Determinants of Health   Financial Resource Strain: Not on file  Food Insecurity: Not on file  Transportation Needs: Not on file  Physical Activity: Not on file  Stress: Not on file  Social Connections: Not on file    Family History  Problem Relation Age of Onset  . Throat cancer Mother        ? thyroid cancer  . Cancer Mother   . Emphysema Father   . Diabetes Father   . Heart attack Father 78  . Colon cancer Brother   . Cerebral aneurysm Brother   . Hypothyroidism Sister        X86  . Cancer Brother        Ear  . Diabetes Paternal Grandmother   . Diabetes Paternal Grandfather   . Diabetes Maternal Aunt     Review of Systems  Constitutional: Negative for fever.  Respiratory: Positive for cough (mild), choking (while eating), shortness of breath (improved on 3 L) and wheezing.   Cardiovascular: Positive for chest pain (occ with stress). Negative for palpitations and leg swelling.  Gastrointestinal: Positive for abdominal pain (chronic). Negative for nausea.   Neurological: Positive for light-headedness. Negative for headaches.       Objective:   Vitals:   06/30/20 1103  BP: 118/68  Pulse: 74  Temp: 97.8 F (36.6 C)  SpO2: 96%   BP Readings from Last 3 Encounters:  06/30/20 118/68  06/28/20 132/80  06/18/20 100/62   Wt Readings from Last 3 Encounters:  06/30/20 134 lb (60.8 kg)  06/28/20 135 lb 12.8 oz (61.6 kg)  06/18/20 136 lb (61.7 kg)   Body mass index is 23 kg/m.   Physical Exam    Constitutional: Chronically ill-appearing on oxygen via nasal cannula. No distress.  HENT:  Head: Normocephalic and atraumatic.  Neck: Neck supple. No tracheal deviation present. No thyromegaly present.  No cervical lymphadenopathy Cardiovascular: Normal rate, regular rhythm and normal heart sounds.   No murmur heard. No carotid bruit .  No edema Pulmonary/Chest: Effort normal and breath sounds normal. No respiratory distress. No has no wheezes. No rales. Abdomen: Soft, nondistended, nontender Musculoskeletal: Tenderness with palpation lower back along spine Skin: Skin is warm and dry. Not diaphoretic.  Psychiatric: Normal mood and affect. Behavior is normal.      Assessment & Plan:    See Problem List for Assessment and Plan of chronic medical problems.    This visit occurred during the SARS-CoV-2 public health emergency.  Safety protocols were in place, including screening questions prior to the visit, additional usage of staff PPE, and extensive cleaning of exam room while observing appropriate contact time as indicated for disinfecting solutions.

## 2020-06-29 NOTE — Telephone Encounter (Signed)
Spoke with patient's daughter [name] and scheduled an in-person Palliative Consult for 07/22/2020 @ 10:30AM   COVID screening was negative. No pets in home. Patient lives with husband.   Consent obtained; updated Outlook/Netsmart/Team List and Epic.

## 2020-06-29 NOTE — Patient Instructions (Addendum)
  Flu immunization administered today.   prolia injection today.   Medications changes include :   Take tylenol 1000 mg three times a day     Please followup in 6 months

## 2020-06-29 NOTE — Telephone Encounter (Signed)
Called and spoke with [name] who states that Dr. Vaughan Browner wanted patient on 10 mg of Prednisone daily but prescription was not sent in. Verified pharmacy. RX has been sent in per AVS instructions from yesterday. Nothing further needed at this time.   Instructions  Continue the bevespi inhaler We will keep you on prednisone at a constant dose of 10 mg a day Continue supplemental oxygen  Make referral to palliative care Get chest x-ray PA and lateral today  Follow-up in 1 to 2 months.

## 2020-06-30 ENCOUNTER — Ambulatory Visit (INDEPENDENT_AMBULATORY_CARE_PROVIDER_SITE_OTHER): Payer: PPO | Admitting: Internal Medicine

## 2020-06-30 ENCOUNTER — Telehealth: Payer: Self-pay

## 2020-06-30 ENCOUNTER — Other Ambulatory Visit: Payer: Self-pay | Admitting: Internal Medicine

## 2020-06-30 ENCOUNTER — Other Ambulatory Visit: Payer: Self-pay

## 2020-06-30 ENCOUNTER — Encounter: Payer: Self-pay | Admitting: Internal Medicine

## 2020-06-30 ENCOUNTER — Ambulatory Visit (INDEPENDENT_AMBULATORY_CARE_PROVIDER_SITE_OTHER): Payer: PPO

## 2020-06-30 VITALS — BP 118/68 | HR 74 | Temp 97.8°F | Ht 64.0 in | Wt 134.0 lb

## 2020-06-30 DIAGNOSIS — M810 Age-related osteoporosis without current pathological fracture: Secondary | ICD-10-CM | POA: Diagnosis not present

## 2020-06-30 DIAGNOSIS — F3289 Other specified depressive episodes: Secondary | ICD-10-CM | POA: Diagnosis not present

## 2020-06-30 DIAGNOSIS — S22080A Wedge compression fracture of T11-T12 vertebra, initial encounter for closed fracture: Secondary | ICD-10-CM

## 2020-06-30 DIAGNOSIS — E039 Hypothyroidism, unspecified: Secondary | ICD-10-CM | POA: Diagnosis not present

## 2020-06-30 DIAGNOSIS — G479 Sleep disorder, unspecified: Secondary | ICD-10-CM | POA: Diagnosis not present

## 2020-06-30 DIAGNOSIS — E782 Mixed hyperlipidemia: Secondary | ICD-10-CM | POA: Diagnosis not present

## 2020-06-30 DIAGNOSIS — M545 Low back pain, unspecified: Secondary | ICD-10-CM

## 2020-06-30 DIAGNOSIS — Z23 Encounter for immunization: Secondary | ICD-10-CM

## 2020-06-30 DIAGNOSIS — R7303 Prediabetes: Secondary | ICD-10-CM

## 2020-06-30 DIAGNOSIS — I10 Essential (primary) hypertension: Secondary | ICD-10-CM | POA: Diagnosis not present

## 2020-06-30 MED ORDER — DENOSUMAB 60 MG/ML ~~LOC~~ SOSY
60.0000 mg | PREFILLED_SYRINGE | Freq: Once | SUBCUTANEOUS | Status: AC
  Administered 2020-06-30: 60 mg via SUBCUTANEOUS

## 2020-06-30 NOTE — Assessment & Plan Note (Signed)
Chronic Last TSH in normal range Continue levothyroxine 125 mcg daily We will check blood work at her next visit

## 2020-06-30 NOTE — Telephone Encounter (Signed)
Patients daughter called she hurt her back she stated her pcp told her to see if she can get a appointment to be seen for Kyphoplasty. CB:[phone]

## 2020-06-30 NOTE — Telephone Encounter (Signed)
New compression fracture at T12

## 2020-06-30 NOTE — Assessment & Plan Note (Signed)
Chronic BP well controlled Continue Lasix 40 mg daily, metoprolol 25 mg daily

## 2020-06-30 NOTE — Telephone Encounter (Signed)
Changed back but ortho care will likely call them

## 2020-06-30 NOTE — Addendum Note (Signed)
Addended by: Binnie Rail on: 06/30/2020 04:06 PM   Modules accepted: Orders

## 2020-06-30 NOTE — Telephone Encounter (Signed)
Neurosurgery, [place], [place], [place] with IR.

## 2020-06-30 NOTE — Telephone Encounter (Signed)
Daughter called, wants to switch back to [facility], as the orth that she preferred does not do the procedure her mom needs.

## 2020-06-30 NOTE — Telephone Encounter (Signed)
Spoke with patient's daughter today. 

## 2020-06-30 NOTE — Assessment & Plan Note (Signed)
Acute on chronic She had two falls last week and since then has had acute, severe at times lower back pain Concern for possible compression fracture She does have a history of compression fractures and is currently on Prolia X-ray today Take Tylenol 1000 mg three times a day Will likely need to see orthopedics

## 2020-06-30 NOTE — Assessment & Plan Note (Signed)
Chronic On Prolia injections every 6 months-one given today 60 mg IM Continue taking calcium and vitamin D daily Not exercising Possible new lumbar fracture after fall last week-x-ray today Stressed fall prevention

## 2020-06-30 NOTE — Assessment & Plan Note (Signed)
Chronic Continue pravastatin 40 mg daily

## 2020-06-30 NOTE — Telephone Encounter (Signed)
IC advised we did not do kyphoplasty here but were happy to see her for her back pain. She refused stating she would rather just go wherever needed rather than having multiple appointments. She would like to know if you recommended somewhere for them to go?

## 2020-06-30 NOTE — Assessment & Plan Note (Signed)
Chronic Continue clonazepam 0.5 mg at bedtime Overall sleep is fairly controlled-currently her back pain is disturbing her sleep some

## 2020-06-30 NOTE — Telephone Encounter (Signed)
Please call her and let her know she does have a fracture in her lower back.   I would like her to see ortho -at [facility] - to see if a procedure would help her pain -- it is a kyphoplasty that helps with acute spine fracture.     I have ordered a referral and they will call her.

## 2020-06-30 NOTE — Telephone Encounter (Signed)
I can change it - I am not sure if anyone there does the procedure, but we can see  - if not they will have her get it done by Interventional radiology  Changed.

## 2020-06-30 NOTE — Assessment & Plan Note (Signed)
Chronic A1c has been stable-we will check at her next visit Encouraged low sugar diet

## 2020-06-30 NOTE — Assessment & Plan Note (Signed)
Chronic Controlled, stable Continue Cymbalta 60 mg daily

## 2020-07-01 NOTE — Telephone Encounter (Signed)
I called and sw pt's daughter to advise of message below. She states that the pt's PCP had recommended [facility] and they have a referral to that office. I am not sure if they do kyphoplasty there so I gave the names of the doctors that Dr. Erlinda Hong recommended just incase needed. To call with any other  questions.

## 2020-07-04 ENCOUNTER — Other Ambulatory Visit: Payer: Self-pay | Admitting: Internal Medicine

## 2020-07-05 ENCOUNTER — Encounter: Payer: Self-pay | Admitting: Internal Medicine

## 2020-07-05 ENCOUNTER — Telehealth: Payer: Self-pay

## 2020-07-05 NOTE — Telephone Encounter (Signed)
ERROR

## 2020-07-05 NOTE — Telephone Encounter (Signed)
[facility] myers patient assistance form completed and faxed for eliquis.

## 2020-07-06 ENCOUNTER — Telehealth: Payer: Self-pay | Admitting: Internal Medicine

## 2020-07-06 DIAGNOSIS — M4854XA Collapsed vertebra, not elsewhere classified, thoracic region, initial encounter for fracture: Secondary | ICD-10-CM | POA: Diagnosis not present

## 2020-07-06 DIAGNOSIS — M4856XA Collapsed vertebra, not elsewhere classified, lumbar region, initial encounter for fracture: Secondary | ICD-10-CM | POA: Diagnosis not present

## 2020-07-06 NOTE — Telephone Encounter (Signed)
[name] (patient's daughter) dropped off a surgical clearance form for Dr. Quay Burow to fill out.   Please fax to [phone] after completion and mail the original to the patient.

## 2020-07-07 MED ORDER — TRAMADOL HCL 50 MG PO TABS: 50.0000 mg | ORAL_TABLET | Freq: Three times a day (TID) | ORAL | 0 refills | Status: DC | PRN

## 2020-07-07 NOTE — Telephone Encounter (Signed)
Clearance faxed today and fax conformation received. Mailed out original clearance today.

## 2020-07-07 NOTE — Telephone Encounter (Signed)
Dr. Vaughan Browner please advise on patient mychart message and please send mychart message to patient or have your nurse once it is completed so they are aware.   Hi Dr Vaughan Browner, I dropped off a clearance form yesterday for mom to have Kyphoplasty. Could you see that it is faxed back to [facility] as soon as possible. Thanks, [name]

## 2020-07-07 NOTE — Telephone Encounter (Signed)
Placed in Dr. Quay Burow folder this morning for her to address.

## 2020-07-07 NOTE — Addendum Note (Signed)
Addended by: Binnie Rail on: 07/07/2020 03:13 PM   Modules accepted: Orders

## 2020-07-08 ENCOUNTER — Other Ambulatory Visit: Payer: Self-pay | Admitting: Orthopedic Surgery

## 2020-07-08 ENCOUNTER — Other Ambulatory Visit (HOSPITAL_COMMUNITY): Payer: Self-pay | Admitting: Orthopedic Surgery

## 2020-07-08 DIAGNOSIS — M4856XA Collapsed vertebra, not elsewhere classified, lumbar region, initial encounter for fracture: Secondary | ICD-10-CM

## 2020-07-08 NOTE — Telephone Encounter (Signed)
I did not get any surgical clearance forms for her this week.

## 2020-07-09 ENCOUNTER — Ambulatory Visit (HOSPITAL_COMMUNITY)
Admission: RE | Admit: 2020-07-09 | Discharge: 2020-07-09 | Disposition: A | Discharge status: Home / Self Care | Payer: PPO | Source: Ambulatory Visit | Attending: Orthopedic Surgery | Admitting: Orthopedic Surgery

## 2020-07-09 ENCOUNTER — Other Ambulatory Visit: Payer: Self-pay

## 2020-07-09 DIAGNOSIS — M4856XA Collapsed vertebra, not elsewhere classified, lumbar region, initial encounter for fracture: Secondary | ICD-10-CM | POA: Diagnosis not present

## 2020-07-09 DIAGNOSIS — K8020 Calculus of gallbladder without cholecystitis without obstruction: Secondary | ICD-10-CM | POA: Diagnosis not present

## 2020-07-09 DIAGNOSIS — M48061 Spinal stenosis, lumbar region without neurogenic claudication: Secondary | ICD-10-CM | POA: Diagnosis not present

## 2020-07-09 DIAGNOSIS — K449 Diaphragmatic hernia without obstruction or gangrene: Secondary | ICD-10-CM | POA: Diagnosis not present

## 2020-07-09 DIAGNOSIS — S32010A Wedge compression fracture of first lumbar vertebra, initial encounter for closed fracture: Secondary | ICD-10-CM | POA: Diagnosis not present

## 2020-07-10 DIAGNOSIS — J441 Chronic obstructive pulmonary disease with (acute) exacerbation: Secondary | ICD-10-CM | POA: Diagnosis not present

## 2020-07-12 NOTE — Telephone Encounter (Signed)
Found forms up front. Forms are for a surgical clearance for kyphoplasty. Procedure has not been scheduled yet. Will put in Dr. Matilde Bash sign folder for review.

## 2020-07-13 ENCOUNTER — Telehealth: Payer: Self-pay

## 2020-07-13 ENCOUNTER — Telehealth: Payer: Self-pay | Admitting: Pulmonary Disease

## 2020-07-13 ENCOUNTER — Encounter: Payer: Self-pay | Admitting: Orthopaedic Surgery

## 2020-07-13 DIAGNOSIS — M4854XA Collapsed vertebra, not elsewhere classified, thoracic region, initial encounter for fracture: Secondary | ICD-10-CM | POA: Diagnosis not present

## 2020-07-13 DIAGNOSIS — M4856XA Collapsed vertebra, not elsewhere classified, lumbar region, initial encounter for fracture: Secondary | ICD-10-CM | POA: Diagnosis not present

## 2020-07-13 DIAGNOSIS — M545 Low back pain, unspecified: Secondary | ICD-10-CM

## 2020-07-13 NOTE — Telephone Encounter (Signed)
NOT NEEDED

## 2020-07-13 NOTE — Telephone Encounter (Signed)
Called and spoke with [name], pts daughter to let her know that the forms have not been signed yet by PM.  She requested that we send a my chart message once they have been signed and faxed.  Pm please advise once the forms are completed.  Thank you.

## 2020-07-14 ENCOUNTER — Telehealth: Payer: Self-pay

## 2020-07-14 DIAGNOSIS — J441 Chronic obstructive pulmonary disease with (acute) exacerbation: Secondary | ICD-10-CM | POA: Diagnosis not present

## 2020-07-14 DIAGNOSIS — I5031 Acute diastolic (congestive) heart failure: Secondary | ICD-10-CM | POA: Diagnosis not present

## 2020-07-14 DIAGNOSIS — J9611 Chronic respiratory failure with hypoxia: Secondary | ICD-10-CM | POA: Diagnosis not present

## 2020-07-14 DIAGNOSIS — J439 Emphysema, unspecified: Secondary | ICD-10-CM | POA: Diagnosis not present

## 2020-07-14 DIAGNOSIS — C3491 Malignant neoplasm of unspecified part of right bronchus or lung: Secondary | ICD-10-CM | POA: Diagnosis not present

## 2020-07-14 NOTE — Telephone Encounter (Signed)
Urgent referral to neurosurgery entered.

## 2020-07-14 NOTE — Telephone Encounter (Signed)
   [facility] Pre-operative Risk Assessment    URGENT SURGERY  Request for surgical clearance:  1. What type of surgery is being performed? T12 KYPHOPLASTY   2. When is this surgery scheduled? 07-16-2020   3. What type of clearance is required (medical clearance vs. Pharmacy clearance to hold med vs. Both)? BOTH  4. Are there any medications that need to be held prior to surgery and how long? NOT LISTED BUT PT TAKES ELIQUIS  5. Practice name and name of physician performing surgery? [facility] DR Duane Lope [place]  ATTN: KELLY   6. What is the office phone number? [phone]   7.   What is the office fax number? [phone]  8.   Anesthesia type (None, local, MAC, general) ? LOCAL W/ REGIONAL    Waylan Rocher 07/14/2020, 10:34 AM  _________________________________________________________________   (provider comments below)

## 2020-07-14 NOTE — Telephone Encounter (Signed)
   Primary Cardiologist: Quay Burow, MD  Chart reviewed as part of pre-operative protocol coverage. Patient was contacted 07/14/2020 in reference to pre-operative risk assessment for pending surgery as outlined below.  Brandi Raymond was last seen on 03/09/31 by Oda Kilts, PA.  Since that day, Brandi Raymond has done well. She is due for follow up 08/2020.   Discussed with her daughter Brandi Raymond via phone per DPR. Tells me they are not planning to proceed with surgery 07/16/20 and are planning to get second opinion.  Chart reviewed by pharmacy team. CHADS2VASc score of 6, CrCl 88mL/min, platelet count 142K. Prior to spinal procedures office recommendation to hold Eliquis 3 days prior which patient is cleared to do.   She is deemed acceptable risk for the planned procedure without additional cardiovascular testing, however surgery may not be performed 07/16/20 due to inadequate preoperative Eliquis discontinuation. After discussion with Brandi Raymond (patient's daughter), patient will continue Eliquis until after second opinion and decision regarding surgery. Updated clearance will need to be requested at that time.   Loel Dubonnet, NP 07/14/2020, 12:44 PM

## 2020-07-14 NOTE — Telephone Encounter (Signed)
Please refer to neurosurgery ASAP for kyphoplasty.  Thanks.

## 2020-07-14 NOTE — Telephone Encounter (Signed)
Brandi Raymond is a 86 yo female with symptomatic Mobitz II AV block s/p St. Jude dual chamber PPM 11/17/59, chronic diastolic heart failure, PAF on Eliquis. She was last seen by Oda Kilts, PA 03/08/20.   Request received for urgent T12 kyphoplasty 07/16/20. Will route to pharmacy team for review due to Eliquis.   Loel Dubonnet, NP

## 2020-07-14 NOTE — Telephone Encounter (Signed)
Referral faxed to [facility] and spine

## 2020-07-14 NOTE — Telephone Encounter (Signed)
Patient with diagnosis of afib on Eliquis for anticoagulation.    Procedure: T12 kyphoplasty Date of procedure: 07/16/20  CHA2DS2-VASc Score = 6  This indicates a 9.7% annual risk of stroke. The patient's score is based upon: CHF History: Yes HTN History: Yes Diabetes History: No Stroke History: No Vascular Disease History: Yes Age Score: 2 Gender Score: 1  CrCl 30mL/min Platelet count 142K  Typically hold Eliquis for 3 days prior to spinal procedure which pt is cleared to do. However, we received this request 2 days prior so pt is unable to hold her anticoagulation for 3 days due to late notice unless [facility] already told pt to start holding Eliquis yesterday - will need to clarify.

## 2020-07-15 ENCOUNTER — Telehealth: Payer: Self-pay

## 2020-07-15 ENCOUNTER — Other Ambulatory Visit: Payer: Self-pay

## 2020-07-15 ENCOUNTER — Emergency Department (HOSPITAL_COMMUNITY): Payer: PPO

## 2020-07-15 ENCOUNTER — Encounter: Payer: Self-pay | Admitting: Orthopaedic Surgery

## 2020-07-15 ENCOUNTER — Inpatient Hospital Stay (HOSPITAL_COMMUNITY)
Admission: EM | Admit: 2020-07-15 | Discharge: 2020-07-24 | DRG: 522 | Disposition: A | Discharge status: Skilled Nursing Facility | Payer: PPO | Attending: Internal Medicine | Admitting: Internal Medicine

## 2020-07-15 ENCOUNTER — Encounter (HOSPITAL_COMMUNITY): Payer: Self-pay | Admitting: *Deleted

## 2020-07-15 DIAGNOSIS — K551 Chronic vascular disorders of intestine: Secondary | ICD-10-CM | POA: Diagnosis present

## 2020-07-15 DIAGNOSIS — F0390 Unspecified dementia without behavioral disturbance: Secondary | ICD-10-CM | POA: Diagnosis present

## 2020-07-15 DIAGNOSIS — Z9889 Other specified postprocedural states: Secondary | ICD-10-CM

## 2020-07-15 DIAGNOSIS — R0902 Hypoxemia: Secondary | ICD-10-CM | POA: Diagnosis not present

## 2020-07-15 DIAGNOSIS — J9612 Chronic respiratory failure with hypercapnia: Secondary | ICD-10-CM | POA: Diagnosis not present

## 2020-07-15 DIAGNOSIS — F0280 Dementia in other diseases classified elsewhere without behavioral disturbance: Secondary | ICD-10-CM | POA: Diagnosis not present

## 2020-07-15 DIAGNOSIS — I480 Paroxysmal atrial fibrillation: Secondary | ICD-10-CM | POA: Diagnosis present

## 2020-07-15 DIAGNOSIS — W010XXA Fall on same level from slipping, tripping and stumbling without subsequent striking against object, initial encounter: Secondary | ICD-10-CM | POA: Diagnosis present

## 2020-07-15 DIAGNOSIS — Z4789 Encounter for other orthopedic aftercare: Secondary | ICD-10-CM | POA: Diagnosis not present

## 2020-07-15 DIAGNOSIS — I110 Hypertensive heart disease with heart failure: Secondary | ICD-10-CM | POA: Diagnosis present

## 2020-07-15 DIAGNOSIS — I712 Thoracic aortic aneurysm, without rupture: Secondary | ICD-10-CM | POA: Diagnosis not present

## 2020-07-15 DIAGNOSIS — F32A Depression, unspecified: Secondary | ICD-10-CM | POA: Diagnosis present

## 2020-07-15 DIAGNOSIS — W19XXXA Unspecified fall, initial encounter: Secondary | ICD-10-CM

## 2020-07-15 DIAGNOSIS — S72002A Fracture of unspecified part of neck of left femur, initial encounter for closed fracture: Secondary | ICD-10-CM | POA: Diagnosis not present

## 2020-07-15 DIAGNOSIS — Z7952 Long term (current) use of systemic steroids: Secondary | ICD-10-CM

## 2020-07-15 DIAGNOSIS — E559 Vitamin D deficiency, unspecified: Secondary | ICD-10-CM | POA: Diagnosis not present

## 2020-07-15 DIAGNOSIS — I5032 Chronic diastolic (congestive) heart failure: Secondary | ICD-10-CM | POA: Diagnosis not present

## 2020-07-15 DIAGNOSIS — S79929A Unspecified injury of unspecified thigh, initial encounter: Secondary | ICD-10-CM | POA: Diagnosis not present

## 2020-07-15 DIAGNOSIS — J439 Emphysema, unspecified: Secondary | ICD-10-CM | POA: Diagnosis not present

## 2020-07-15 DIAGNOSIS — E785 Hyperlipidemia, unspecified: Secondary | ICD-10-CM | POA: Diagnosis present

## 2020-07-15 DIAGNOSIS — K5790 Diverticulosis of intestine, part unspecified, without perforation or abscess without bleeding: Secondary | ICD-10-CM | POA: Diagnosis not present

## 2020-07-15 DIAGNOSIS — M5137 Other intervertebral disc degeneration, lumbosacral region: Secondary | ICD-10-CM | POA: Diagnosis not present

## 2020-07-15 DIAGNOSIS — G309 Alzheimer's disease, unspecified: Secondary | ICD-10-CM | POA: Diagnosis not present

## 2020-07-15 DIAGNOSIS — I5031 Acute diastolic (congestive) heart failure: Secondary | ICD-10-CM | POA: Diagnosis not present

## 2020-07-15 DIAGNOSIS — N179 Acute kidney failure, unspecified: Secondary | ICD-10-CM | POA: Diagnosis present

## 2020-07-15 DIAGNOSIS — S72142A Displaced intertrochanteric fracture of left femur, initial encounter for closed fracture: Secondary | ICD-10-CM | POA: Diagnosis not present

## 2020-07-15 DIAGNOSIS — I10 Essential (primary) hypertension: Secondary | ICD-10-CM

## 2020-07-15 DIAGNOSIS — Z7901 Long term (current) use of anticoagulants: Secondary | ICD-10-CM

## 2020-07-15 DIAGNOSIS — I252 Old myocardial infarction: Secondary | ICD-10-CM

## 2020-07-15 DIAGNOSIS — I517 Cardiomegaly: Secondary | ICD-10-CM | POA: Diagnosis not present

## 2020-07-15 DIAGNOSIS — R52 Pain, unspecified: Secondary | ICD-10-CM

## 2020-07-15 DIAGNOSIS — M9702XA Periprosthetic fracture around internal prosthetic left hip joint, initial encounter: Secondary | ICD-10-CM | POA: Diagnosis not present

## 2020-07-15 DIAGNOSIS — Z043 Encounter for examination and observation following other accident: Secondary | ICD-10-CM | POA: Diagnosis not present

## 2020-07-15 DIAGNOSIS — M1990 Unspecified osteoarthritis, unspecified site: Secondary | ICD-10-CM | POA: Diagnosis not present

## 2020-07-15 DIAGNOSIS — W19XXXD Unspecified fall, subsequent encounter: Secondary | ICD-10-CM | POA: Diagnosis not present

## 2020-07-15 DIAGNOSIS — M8088XA Other osteoporosis with current pathological fracture, vertebra(e), initial encounter for fracture: Secondary | ICD-10-CM | POA: Diagnosis not present

## 2020-07-15 DIAGNOSIS — K449 Diaphragmatic hernia without obstruction or gangrene: Secondary | ICD-10-CM | POA: Diagnosis present

## 2020-07-15 DIAGNOSIS — I959 Hypotension, unspecified: Secondary | ICD-10-CM | POA: Diagnosis not present

## 2020-07-15 DIAGNOSIS — E782 Mixed hyperlipidemia: Secondary | ICD-10-CM | POA: Diagnosis present

## 2020-07-15 DIAGNOSIS — D62 Acute posthemorrhagic anemia: Secondary | ICD-10-CM | POA: Diagnosis not present

## 2020-07-15 DIAGNOSIS — Z950 Presence of cardiac pacemaker: Secondary | ICD-10-CM

## 2020-07-15 DIAGNOSIS — J449 Chronic obstructive pulmonary disease, unspecified: Secondary | ICD-10-CM | POA: Diagnosis not present

## 2020-07-15 DIAGNOSIS — J9611 Chronic respiratory failure with hypoxia: Secondary | ICD-10-CM | POA: Diagnosis not present

## 2020-07-15 DIAGNOSIS — D649 Anemia, unspecified: Secondary | ICD-10-CM

## 2020-07-15 DIAGNOSIS — D509 Iron deficiency anemia, unspecified: Secondary | ICD-10-CM | POA: Diagnosis not present

## 2020-07-15 DIAGNOSIS — K219 Gastro-esophageal reflux disease without esophagitis: Secondary | ICD-10-CM | POA: Diagnosis not present

## 2020-07-15 DIAGNOSIS — Z96642 Presence of left artificial hip joint: Secondary | ICD-10-CM | POA: Diagnosis not present

## 2020-07-15 DIAGNOSIS — Z9981 Dependence on supplemental oxygen: Secondary | ICD-10-CM

## 2020-07-15 DIAGNOSIS — C3491 Malignant neoplasm of unspecified part of right bronchus or lung: Secondary | ICD-10-CM | POA: Diagnosis not present

## 2020-07-15 DIAGNOSIS — Z9181 History of falling: Secondary | ICD-10-CM

## 2020-07-15 DIAGNOSIS — Z7401 Bed confinement status: Secondary | ICD-10-CM | POA: Diagnosis not present

## 2020-07-15 DIAGNOSIS — Z66 Do not resuscitate: Secondary | ICD-10-CM | POA: Diagnosis present

## 2020-07-15 DIAGNOSIS — M8008XA Age-related osteoporosis with current pathological fracture, vertebra(e), initial encounter for fracture: Secondary | ICD-10-CM | POA: Diagnosis not present

## 2020-07-15 DIAGNOSIS — Z4889 Encounter for other specified surgical aftercare: Secondary | ICD-10-CM | POA: Diagnosis not present

## 2020-07-15 DIAGNOSIS — Z85118 Personal history of other malignant neoplasm of bronchus and lung: Secondary | ICD-10-CM

## 2020-07-15 DIAGNOSIS — J9811 Atelectasis: Secondary | ICD-10-CM | POA: Diagnosis not present

## 2020-07-15 DIAGNOSIS — I2510 Atherosclerotic heart disease of native coronary artery without angina pectoris: Secondary | ICD-10-CM | POA: Diagnosis not present

## 2020-07-15 DIAGNOSIS — Z20822 Contact with and (suspected) exposure to covid-19: Secondary | ICD-10-CM | POA: Diagnosis not present

## 2020-07-15 DIAGNOSIS — M25552 Pain in left hip: Secondary | ICD-10-CM | POA: Diagnosis not present

## 2020-07-15 DIAGNOSIS — Z87891 Personal history of nicotine dependence: Secondary | ICD-10-CM

## 2020-07-15 DIAGNOSIS — Z79890 Hormone replacement therapy (postmenopausal): Secondary | ICD-10-CM

## 2020-07-15 DIAGNOSIS — S72009A Fracture of unspecified part of neck of unspecified femur, initial encounter for closed fracture: Secondary | ICD-10-CM

## 2020-07-15 DIAGNOSIS — E039 Hypothyroidism, unspecified: Secondary | ICD-10-CM | POA: Diagnosis present

## 2020-07-15 DIAGNOSIS — Z471 Aftercare following joint replacement surgery: Secondary | ICD-10-CM | POA: Diagnosis not present

## 2020-07-15 DIAGNOSIS — Z923 Personal history of irradiation: Secondary | ICD-10-CM

## 2020-07-15 DIAGNOSIS — Z79899 Other long term (current) drug therapy: Secondary | ICD-10-CM

## 2020-07-15 DIAGNOSIS — K08109 Complete loss of teeth, unspecified cause, unspecified class: Secondary | ICD-10-CM | POA: Diagnosis present

## 2020-07-15 DIAGNOSIS — Z955 Presence of coronary angioplasty implant and graft: Secondary | ICD-10-CM

## 2020-07-15 DIAGNOSIS — S79912A Unspecified injury of left hip, initial encounter: Secondary | ICD-10-CM | POA: Diagnosis not present

## 2020-07-15 DIAGNOSIS — M2550 Pain in unspecified joint: Secondary | ICD-10-CM | POA: Diagnosis not present

## 2020-07-15 DIAGNOSIS — S72142D Displaced intertrochanteric fracture of left femur, subsequent encounter for closed fracture with routine healing: Secondary | ICD-10-CM | POA: Diagnosis not present

## 2020-07-15 DIAGNOSIS — Z90710 Acquired absence of both cervix and uterus: Secondary | ICD-10-CM

## 2020-07-15 DIAGNOSIS — Z8701 Personal history of pneumonia (recurrent): Secondary | ICD-10-CM

## 2020-07-15 DIAGNOSIS — S7292XA Unspecified fracture of left femur, initial encounter for closed fracture: Secondary | ICD-10-CM | POA: Diagnosis not present

## 2020-07-15 DIAGNOSIS — Z825 Family history of asthma and other chronic lower respiratory diseases: Secondary | ICD-10-CM

## 2020-07-15 DIAGNOSIS — Z888 Allergy status to other drugs, medicaments and biological substances status: Secondary | ICD-10-CM

## 2020-07-15 LAB — CBC WITH DIFFERENTIAL/PLATELET
Abs Immature Granulocytes: 0.13 10*3/uL — ABNORMAL HIGH (ref 0.00–0.07)
Basophils Absolute: 0 10*3/uL (ref 0.0–0.1)
Basophils Relative: 1 %
Eosinophils Absolute: 0 10*3/uL (ref 0.0–0.5)
Eosinophils Relative: 1 %
HCT: 34.8 % — ABNORMAL LOW (ref 36.0–46.0)
Hemoglobin: 9.7 g/dL — ABNORMAL LOW (ref 12.0–15.0)
Immature Granulocytes: 2 %
Lymphocytes Relative: 9 %
Lymphs Abs: 0.7 10*3/uL (ref 0.7–4.0)
MCH: 25.3 pg — ABNORMAL LOW (ref 26.0–34.0)
MCHC: 27.9 g/dL — ABNORMAL LOW (ref 30.0–36.0)
MCV: 90.6 fL (ref 80.0–100.0)
Monocytes Absolute: 0.4 10*3/uL (ref 0.1–1.0)
Monocytes Relative: 5 %
Neutro Abs: 7.1 10*3/uL (ref 1.7–7.7)
Neutrophils Relative %: 82 %
Platelets: 191 10*3/uL (ref 150–400)
RBC: 3.84 MIL/uL — ABNORMAL LOW (ref 3.87–5.11)
RDW: 16.6 % — ABNORMAL HIGH (ref 11.5–15.5)
WBC: 8.5 10*3/uL (ref 4.0–10.5)
nRBC: 0 % (ref 0.0–0.2)

## 2020-07-15 LAB — COMPREHENSIVE METABOLIC PANEL
ALT: 21 U/L (ref 0–44)
AST: 26 U/L (ref 15–41)
Albumin: 3.2 g/dL — ABNORMAL LOW (ref 3.5–5.0)
Alkaline Phosphatase: 112 U/L (ref 38–126)
Anion gap: 10 (ref 5–15)
BUN: 20 mg/dL (ref 8–23)
CO2: 27 mmol/L (ref 22–32)
Calcium: 8.3 mg/dL — ABNORMAL LOW (ref 8.9–10.3)
Chloride: 101 mmol/L (ref 98–111)
Creatinine, Ser: 1.05 mg/dL — ABNORMAL HIGH (ref 0.44–1.00)
GFR, Estimated: 52 mL/min — ABNORMAL LOW (ref >60)
Glucose, Bld: 102 mg/dL — ABNORMAL HIGH (ref 70–99)
Potassium: 4.7 mmol/L (ref 3.5–5.1)
Sodium: 138 mmol/L (ref 135–145)
Total Bilirubin: 0.4 mg/dL (ref 0.3–1.2)
Total Protein: 6.1 g/dL — ABNORMAL LOW (ref 6.5–8.1)

## 2020-07-15 LAB — RESP PANEL BY RT-PCR (FLU A&B, COVID) ARPGX2
Influenza A by PCR: NEGATIVE
Influenza B by PCR: NEGATIVE
SARS Coronavirus 2 by RT PCR: NEGATIVE

## 2020-07-15 MED ORDER — ACETAMINOPHEN 500 MG PO TABS
1000.0000 mg | ORAL_TABLET | Freq: Three times a day (TID) | ORAL | Status: DC
  Administered 2020-07-15 – 2020-07-16 (×4): 1000 mg via ORAL
  Filled 2020-07-15 (×4): qty 2

## 2020-07-15 MED ORDER — HYDROMORPHONE HCL 1 MG/ML IJ SOLN
1.0000 mg | Freq: Once | INTRAMUSCULAR | Status: AC
  Administered 2020-07-15: 1 mg via INTRAVENOUS
  Filled 2020-07-15: qty 1

## 2020-07-15 MED ORDER — FUROSEMIDE 40 MG PO TABS
40.0000 mg | ORAL_TABLET | ORAL | Status: DC
  Administered 2020-07-15 – 2020-07-17 (×2): 40 mg via ORAL
  Filled 2020-07-15: qty 1
  Filled 2020-07-15 (×2): qty 2

## 2020-07-15 MED ORDER — PANTOPRAZOLE SODIUM 40 MG PO TBEC
40.0000 mg | DELAYED_RELEASE_TABLET | Freq: Two times a day (BID) | ORAL | Status: DC
  Administered 2020-07-15 – 2020-07-24 (×18): 40 mg via ORAL
  Filled 2020-07-15 (×18): qty 1

## 2020-07-15 MED ORDER — HEPARIN (PORCINE) 25000 UT/250ML-% IV SOLN
750.0000 [IU]/h | INTRAVENOUS | Status: DC
  Administered 2020-07-15: 900 [IU]/h via INTRAVENOUS
  Filled 2020-07-15: qty 250

## 2020-07-15 MED ORDER — GABAPENTIN 300 MG PO CAPS
300.0000 mg | ORAL_CAPSULE | Freq: Three times a day (TID) | ORAL | Status: DC
  Administered 2020-07-15 – 2020-07-24 (×24): 300 mg via ORAL
  Filled 2020-07-15 (×24): qty 1

## 2020-07-15 MED ORDER — DONEPEZIL HCL 10 MG PO TABS
10.0000 mg | ORAL_TABLET | Freq: Every day | ORAL | Status: DC
  Administered 2020-07-15 – 2020-07-23 (×9): 10 mg via ORAL
  Filled 2020-07-15 (×11): qty 1

## 2020-07-15 MED ORDER — MORPHINE SULFATE (PF) 2 MG/ML IV SOLN
0.5000 mg | INTRAVENOUS | Status: DC | PRN
  Administered 2020-07-16 (×2): 0.5 mg via INTRAVENOUS
  Filled 2020-07-15 (×2): qty 1

## 2020-07-15 MED ORDER — ZOLPIDEM TARTRATE 5 MG PO TABS
5.0000 mg | ORAL_TABLET | Freq: Every evening | ORAL | Status: DC | PRN
  Administered 2020-07-21: 5 mg via ORAL
  Filled 2020-07-15 (×2): qty 1

## 2020-07-15 MED ORDER — HYDROCODONE-ACETAMINOPHEN 5-325 MG PO TABS
1.0000 | ORAL_TABLET | Freq: Four times a day (QID) | ORAL | Status: DC | PRN
  Filled 2020-07-15: qty 1

## 2020-07-15 MED ORDER — LEVOTHYROXINE SODIUM 25 MCG PO TABS
125.0000 ug | ORAL_TABLET | Freq: Every day | ORAL | Status: DC
  Administered 2020-07-16 – 2020-07-24 (×7): 125 ug via ORAL
  Filled 2020-07-15 (×7): qty 1

## 2020-07-15 MED ORDER — ARFORMOTEROL TARTRATE 15 MCG/2ML IN NEBU
15.0000 ug | INHALATION_SOLUTION | Freq: Two times a day (BID) | RESPIRATORY_TRACT | Status: DC
  Administered 2020-07-15 – 2020-07-23 (×13): 15 ug via RESPIRATORY_TRACT
  Filled 2020-07-15 (×20): qty 2

## 2020-07-15 MED ORDER — CLONAZEPAM 0.5 MG PO TABS
0.5000 mg | ORAL_TABLET | Freq: Every day | ORAL | Status: DC
  Administered 2020-07-15 – 2020-07-23 (×9): 0.5 mg via ORAL
  Filled 2020-07-15 (×9): qty 1

## 2020-07-15 MED ORDER — PREDNISONE 10 MG PO TABS
10.0000 mg | ORAL_TABLET | Freq: Every day | ORAL | Status: DC
  Administered 2020-07-16 – 2020-07-24 (×8): 10 mg via ORAL
  Filled 2020-07-15 (×8): qty 1

## 2020-07-15 MED ORDER — PRAVASTATIN SODIUM 40 MG PO TABS
40.0000 mg | ORAL_TABLET | Freq: Every day | ORAL | Status: DC
  Administered 2020-07-16 – 2020-07-24 (×8): 40 mg via ORAL
  Filled 2020-07-15 (×9): qty 1

## 2020-07-15 MED ORDER — FENTANYL CITRATE (PF) 100 MCG/2ML IJ SOLN
50.0000 ug | Freq: Once | INTRAMUSCULAR | Status: AC
Start: 2020-07-15 — End: 2020-07-15
  Administered 2020-07-15: 50 ug via INTRAVENOUS
  Filled 2020-07-15: qty 2

## 2020-07-15 MED ORDER — DULOXETINE HCL 60 MG PO CPEP
60.0000 mg | ORAL_CAPSULE | Freq: Every day | ORAL | Status: DC
  Administered 2020-07-16 – 2020-07-24 (×9): 60 mg via ORAL
  Filled 2020-07-15 (×9): qty 1

## 2020-07-15 MED ORDER — UMECLIDINIUM BROMIDE 62.5 MCG/INH IN AEPB
1.0000 | INHALATION_SPRAY | Freq: Every day | RESPIRATORY_TRACT | Status: DC
  Administered 2020-07-16 – 2020-07-19 (×2): 1 via RESPIRATORY_TRACT
  Filled 2020-07-15 (×3): qty 7

## 2020-07-15 MED ORDER — METOPROLOL TARTRATE 25 MG PO TABS
25.0000 mg | ORAL_TABLET | Freq: Every day | ORAL | Status: DC
  Administered 2020-07-16 – 2020-07-24 (×9): 25 mg via ORAL
  Filled 2020-07-15 (×9): qty 1

## 2020-07-15 MED ORDER — IPRATROPIUM BROMIDE 0.02 % IN SOLN: 0.5000 mg | Freq: Four times a day (QID) | RESPIRATORY_TRACT | Status: DC | PRN

## 2020-07-15 MED ORDER — DOCUSATE SODIUM 100 MG PO CAPS
100.0000 mg | ORAL_CAPSULE | Freq: Every day | ORAL | Status: DC
  Administered 2020-07-16: 100 mg via ORAL
  Filled 2020-07-15: qty 1

## 2020-07-15 MED ORDER — GABAPENTIN 300 MG PO CAPS: 600.0000 mg | ORAL_CAPSULE | Freq: Every day | ORAL | Status: DC

## 2020-07-15 MED ORDER — SENNA 8.6 MG PO TABS
1.0000 | ORAL_TABLET | Freq: Two times a day (BID) | ORAL | Status: DC
  Administered 2020-07-15 – 2020-07-24 (×17): 8.6 mg via ORAL
  Filled 2020-07-15 (×18): qty 1

## 2020-07-15 MED ORDER — POTASSIUM CHLORIDE ER 10 MEQ PO TBCR
10.0000 meq | EXTENDED_RELEASE_TABLET | ORAL | Status: DC
  Filled 2020-07-15: qty 1

## 2020-07-15 MED ORDER — SODIUM CHLORIDE 0.9 % IV SOLN: INTRAVENOUS | Status: DC

## 2020-07-15 NOTE — Progress Notes (Signed)
[facility] for Heparin Indication: atrial fibrillation  Allergies  Allergen Reactions  . Silver Sulfadiazine Other (See Comments)    lowers white blood count     Patient Measurements: Height: 5\' 4"  (162.6 cm) Weight: 61.2 kg (135 lb) IBW/kg (Calculated) : 54.7 Heparin Dosing Weight: 61.2 kg  Vital Signs: Temp: 98.1 F (36.7 C) (12/30 1626) Temp Source: Oral (12/30 1626) BP: 155/91 (12/30 1830) Pulse Rate: 90 (12/30 1830)  Labs: Recent Labs    07/15/20 1237  HGB 9.7*  HCT 34.8*  PLT 191  CREATININE 1.05*    Estimated Creatinine Clearance: 34.4 mL/min (A) (by C-G formula based on SCr of 1.05 mg/dL (H)).   Medical History: Past Medical History:  Diagnosis Date  . Adenomatous colon polyp   . Arthritis   . CAD (coronary artery disease)    PTCA of RCA   . Carotid artery occlusion   . Cervical spine fracture ([place])   . COPD (chronic obstructive pulmonary disease) ([place])   . Depression   . Diverticulosis   . Gastritis 09/15/1991  . GERD (gastroesophageal reflux disease) 09/15/1991   Dr Sharlett Iles  . Hiatal hernia 09/15/1991  . Hip fracture, right ([place])   . HLD (hyperlipidemia)   . HTN (hypertension)   . Hyperplastic polyps of stomach 11/2007   colonoscopy  . Hypertension   . Hypothyroidism    affecting the left eye, proptosis  . Iron deficiency anemia   . Lung cancer ([place])    s/p XRT  . Macular degeneration of left eye   . Memory loss   . Mesenteric artery stenosis ([place])   . Myocardial infarction ([place]) 1993  . Ocular myasthenia gravis ([place])    Dr Jannifer Franklin  . Orthostatic hypotension 06/05/2013  . PONV (postoperative nausea and vomiting)   . Strabismus    left eye  . Syncope 1998    Medications:  Scheduled:  . acetaminophen  1,000 mg Oral Q8H  . arformoterol  15 mcg Nebulization BID  . clonazePAM  0.5 mg Oral QHS  . docusate sodium  100 mg Oral QHS  . donepezil  10 mg Oral QHS  . DULoxetine  60 mg Oral Daily  .  furosemide  40 mg Oral QODAY  . gabapentin  300 mg Oral BID  . gabapentin  600 mg Oral QHS  . [START ON 07/16/2020] levothyroxine  125 mcg Oral QAC breakfast  . methylPREDNISolone acetate  80 mg Intramuscular Once  . metoprolol tartrate  25 mg Oral Daily  . pantoprazole  40 mg Oral BID  . [START ON 07/16/2020] potassium chloride  10 mEq Oral Once per day on Mon Wed Fri  . pravastatin  40 mg Oral Daily  . [START ON 07/16/2020] predniSONE  10 mg Oral Q breakfast  . senna  1 tablet Oral BID  . umeclidinium bromide  1 puff Inhalation Daily    Assessment: Patient is a 32 yof that is being admitted for a hip fracture. The patient takes apixaban pta for afib which needs to be held for her hip surgery. Pharmacy has been asked to dose heparin at this time while holding her apixaban.   Last dose of Apixaban  was at 0800 on 12/30 Goal of Therapy:  aPTT 66-102 seconds Monitor platelets by anticoagulation protocol: Yes   Plan:  - No Heparin bolus as on Apixaban PTA last dose was at 0800 on 12/30 - Start heparin drip @ 900 units/hr on 12/30 at 2000  - Will  monitor heparin using aPTT until aPTT and Heparin levels correlate - aPTT level in ~ 8 hours  - Monitor patient for s/s of bleeding and CBC while on heparin   Duanne Limerick PharmD. BCPS  07/15/2020,7:24 PM

## 2020-07-15 NOTE — ED Notes (Signed)
Patient A0x4 at this time  Family at bedside  VSS  purewick placed  Wears 3L [place] chronic

## 2020-07-15 NOTE — Telephone Encounter (Signed)
Brandi Raymond PAF received, completed, and faxed.
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
## 2020-07-15 NOTE — ED Notes (Signed)
Patient placed on hospital bed.

## 2020-07-15 NOTE — ED Triage Notes (Signed)
Patient presents to ed via [facility] states she was trying to sit down in her chair and lost her balance and fell landing on her left hip, shortening and rotation to left hip, positive left pedal pulse. Patient was given Fentanyl 200 mg iv per ems. Patient denies loc, alert oriented.

## 2020-07-16 ENCOUNTER — Encounter (HOSPITAL_COMMUNITY): Payer: Self-pay | Admitting: Internal Medicine

## 2020-07-16 DIAGNOSIS — S72002A Fracture of unspecified part of neck of left femur, initial encounter for closed fracture: Secondary | ICD-10-CM

## 2020-07-16 HISTORY — DX: Fracture of unspecified part of neck of left femur, initial encounter for closed fracture: S72.002A

## 2020-07-16 LAB — COMPREHENSIVE METABOLIC PANEL
ALT: 18 U/L (ref 0–44)
AST: 35 U/L (ref 15–41)
Albumin: 2.9 g/dL — ABNORMAL LOW (ref 3.5–5.0)
Alkaline Phosphatase: 103 U/L (ref 38–126)
Anion gap: 14 (ref 5–15)
BUN: 23 mg/dL (ref 8–23)
CO2: 23 mmol/L (ref 22–32)
Calcium: 7.2 mg/dL — ABNORMAL LOW (ref 8.9–10.3)
Chloride: 98 mmol/L (ref 98–111)
Creatinine, Ser: 1.34 mg/dL — ABNORMAL HIGH (ref 0.44–1.00)
GFR, Estimated: 39 mL/min — ABNORMAL LOW (ref >60)
Glucose, Bld: 128 mg/dL — ABNORMAL HIGH (ref 70–99)
Potassium: 4.2 mmol/L (ref 3.5–5.1)
Sodium: 135 mmol/L (ref 135–145)
Total Bilirubin: 0.7 mg/dL (ref 0.3–1.2)
Total Protein: 5.9 g/dL — ABNORMAL LOW (ref 6.5–8.1)

## 2020-07-16 LAB — CBC
HCT: 34.3 % — ABNORMAL LOW (ref 36.0–46.0)
Hemoglobin: 9.8 g/dL — ABNORMAL LOW (ref 12.0–15.0)
MCH: 25.6 pg — ABNORMAL LOW (ref 26.0–34.0)
MCHC: 28.6 g/dL — ABNORMAL LOW (ref 30.0–36.0)
MCV: 89.6 fL (ref 80.0–100.0)
Platelets: 161 10*3/uL (ref 150–400)
RBC: 3.83 MIL/uL — ABNORMAL LOW (ref 3.87–5.11)
RDW: 16.6 % — ABNORMAL HIGH (ref 11.5–15.5)
WBC: 9.6 10*3/uL (ref 4.0–10.5)
nRBC: 0 % (ref 0.0–0.2)

## 2020-07-16 LAB — TSH: TSH: 0.82 u[IU]/mL (ref 0.350–4.500)

## 2020-07-16 LAB — APTT
aPTT: 80 seconds — ABNORMAL HIGH (ref 24–36)
aPTT: 114 seconds — ABNORMAL HIGH (ref 24–36)

## 2020-07-16 LAB — SURGICAL PCR SCREEN
MRSA, PCR: NEGATIVE
Staphylococcus aureus: NEGATIVE

## 2020-07-16 LAB — HEPARIN LEVEL (UNFRACTIONATED): Heparin Unfractionated: 0.96 IU/mL — ABNORMAL HIGH (ref 0.30–0.70)

## 2020-07-16 MED ORDER — OXYCODONE HCL 5 MG PO TABS: 5.0000 mg | ORAL_TABLET | ORAL | Status: DC | PRN

## 2020-07-16 MED ORDER — OXYCODONE HCL 5 MG PO TABS: 2.5000 mg | ORAL_TABLET | ORAL | Status: DC | PRN

## 2020-07-16 MED ORDER — HYDROCORTISONE NA SUCCINATE PF 100 MG IJ SOLR
25.0000 mg | Freq: Three times a day (TID) | INTRAMUSCULAR | Status: AC
  Administered 2020-07-17: 25 mg via INTRAVENOUS
  Filled 2020-07-16 (×4): qty 0.5

## 2020-07-16 MED ORDER — ENSURE ENLIVE PO LIQD: 237.0000 mL | Freq: Two times a day (BID) | ORAL | Status: DC

## 2020-07-16 MED ORDER — METOPROLOL TARTRATE 5 MG/5ML IV SOLN
2.5000 mg | Freq: Four times a day (QID) | INTRAVENOUS | Status: DC | PRN
  Administered 2020-07-16: 2.5 mg via INTRAVENOUS
  Filled 2020-07-16: qty 5

## 2020-07-16 MED ORDER — ADULT MULTIVITAMIN W/MINERALS CH
1.0000 | ORAL_TABLET | Freq: Every day | ORAL | Status: DC
  Administered 2020-07-16 – 2020-07-24 (×9): 1 via ORAL
  Filled 2020-07-16 (×9): qty 1

## 2020-07-16 MED ORDER — CEFAZOLIN SODIUM-DEXTROSE 2-4 GM/100ML-% IV SOLN
2.0000 g | Freq: Once | INTRAVENOUS | Status: AC
  Administered 2020-07-16: 2 g via INTRAVENOUS
  Filled 2020-07-16: qty 100

## 2020-07-16 NOTE — Progress Notes (Signed)
PROGRESS NOTE    Brandi Raymond  JQZ:[id number] DOB: 05-Nov-1933 DOA: 07/15/2020 PCP: Binnie Rail, MD   Chief Complaint  Patient presents with  . Fall    Brief Narrative:  Brandi Raymond is Brandi Raymond 86 y.o. female with medical history significant of [place] fib, COPD, SCC right lung s/p XRT, GERD, Dementia, HTN, CAD, memory loss. She has multiple vertebral compressiosn fractrures and was scheduled for kyphoplasty. She was waiting for her grad-daughter to take to the doctor. She stood up, lost her balance and fell with subsequent pain and inability to bear weight. She comes to [facility] for evaluation.  ED Course: T 98.1  141/111  HR 81  RR 18, on O2. Cmet nl, Hgb 9.7 (chronic anemia). CXR NAD, emphysema, hiatal hernia. Xray left hip with impacted intertrochanteric fx. Pt seen by ortho and is for surgery 07/17/20. TR[facility] called to admit, clear for surgery and manage medical issues.   Assessment & Plan:   Active Problems:   Hypothyroidism   HYPERLIPIDEMIA   Essential hypertension   COPD (chronic obstructive pulmonary disease) with emphysema (HCC)   Anemia   Chronic mesenteric ischemia (HCC)   GERD (gastroesophageal reflux disease)   Paroxysmal atrial fibrillation (HCC)   Closed left hip fracture ([place])  1. Left femoral neck fracture - after mechanical fall - eliquis on hold, heparin gtt for now - RCRI 1 - she's also at increased risk due to her COPD on chronic home O2 and advanced age.  Generally gets around with Diamond Martucci walker.  Discussed risks/benefits, she notes understanding. - plan for surgery 07/17/2020 per ortho  - scheduled APAP, oxy, morphine prn - bowel regimen  2. COPD - oxygen dependent on 3 L at home. Currently stable Continue incruse ellipta, prn atrovent, brovana On chronic prednisone -> plan for stress dose steroids during surgery  3. Compression fx's spine - active problem. Was for kyphoplasty - now delayed to #1 Compression fx of T12, L1, L3 on imaging Discussed with neurosurgery    4. Atrial Fibrillation with RVR - currently in the 130's-150's - fluctuating pretty widely Follow with home PO metop Metop IV prn for sustained HR > 120 Continue heparin Follow EKG Suspect RVR 2/2 acute hospitalization  5. Anemia - chronic problem - stable  6. Hypothyroidism- continue home meds.  Follow TSH  7. Memory loss - continue home meds.   8. Oncology - follows with Dr. Earlie Server for [place] right lung s/p xrt question of recurrence per grand-daughter. Currently stable.  # Hypotension: resolved, she notes they were taking BP on L arm, which gives inaccurate readings, continue to monitor  DVT prophylaxis: heparin  Code Status: dnr Family Communication: none at bedside Disposition:   Status is: Inpatient  Remains inpatient appropriate because:Inpatient level of care appropriate due to severity of illness   Dispo: The patient is from: Home              Anticipated d/c is to: pending              Anticipated d/c date is: > 3 days              Patient currently is not medically stable to d/c.  Consultants:   Ortho  neurosurgery  Procedures:   none  Antimicrobials: Anti-infectives (From admission, onward)   Start     Dose/Rate Route Frequency Ordered Stop   07/16/20 0945  ceFAZolin (ANCEF) IVPB 2g/100 mL premix       Note to Pharmacy: Anesthesia to  give preop   2 g 200 mL/hr over 30 Minutes Intravenous  Once 07/16/20 0941 07/16/20 1107        Subjective: Feels ok, no new complaints Pain is ok Getting annoyed with frequent interruptions   Objective: Vitals:   07/15/20 2215 07/16/20 0215 07/16/20 0532 07/16/20 0730  BP: (!) 119/95 97/74 (!) 80/69 104/72  Pulse: 83 88 (!) 115 (!) 138  Resp: (!) 24 (!) 22 14 (!) 21  Temp:      TempSrc:      SpO2: 98% 98% 100% 100%  Weight:      Height:       No intake or output data in the 24 hours ending 07/16/20 0911 Filed Weights   07/15/20 1129  Weight: 61.2 kg    Examination:  General exam: Appears  calm and comfortable  Respiratory system: Clear to auscultation. Respiratory effort normal. Cardiovascular system: irregularly irregular, tachy Gastrointestinal system: Abdomen is nondistended, soft and nontender. Central nervous system: Alert and oriented. No focal neurological deficits. Extremities: LLE TTP Skin: No rashes, lesions or ulcers Psychiatry: Judgement and insight appear normal. Mood & affect appropriate.     Data Reviewed: I have personally reviewed following labs and imaging studies  CBC: Recent Labs  Lab 07/15/20 1237 07/16/20 0241  WBC 8.5 9.6  NEUTROABS 7.1  --   HGB 9.7* 9.8*  HCT 34.8* 34.3*  MCV 90.6 89.6  PLT 191 016    Basic Metabolic Panel: Recent Labs  Lab 07/15/20 1237  NA 138  K 4.7  CL 101  CO2 27  GLUCOSE 102*  BUN 20  CREATININE 1.05*  CALCIUM 8.3*    GFR: Estimated Creatinine Clearance: 34.4 mL/min (Charisma Charlot) (by C-G formula based on SCr of 1.05 mg/dL (H)).  Liver Function Tests: Recent Labs  Lab 07/15/20 1237  AST 26  ALT 21  ALKPHOS 112  BILITOT 0.4  PROT 6.1*  ALBUMIN 3.2*    CBG: No results for input(s): GLUCAP in the last 168 hours.   Recent Results (from the past 240 hour(s))  Resp Panel by RT-PCR (Flu Malaiah Viramontes&B, Covid) Nasopharyngeal Swab     Status: None   Collection Time: 07/15/20 12:45 PM   Specimen: Nasopharyngeal Swab; Nasopharyngeal(NP) swabs in vial transport medium  Result Value Ref Range Status   SARS Coronavirus 2 by RT PCR NEGATIVE NEGATIVE Final    Comment: (NOTE) SARS-CoV-2 target nucleic acids are NOT DETECTED.  The SARS-CoV-2 RNA is generally detectable in upper respiratory specimens during the acute phase of infection. The lowest concentration of SARS-CoV-2 viral copies this assay can detect is 138 copies/mL. Montavis Schubring negative result does not preclude SARS-Cov-2 infection and should not be used as the sole basis for treatment or other patient management decisions. Rogan Wigley negative result may occur with  improper  specimen collection/handling, submission of specimen other than nasopharyngeal swab, presence of viral mutation(s) within the areas targeted by this assay, and inadequate number of viral copies(<138 copies/mL). Miley Lindon negative result must be combined with clinical observations, patient history, and epidemiological information. The expected result is Negative.  Fact Sheet for Patients:  EntrepreneurPulse.com.au  Fact Sheet for Healthcare Providers:  IncredibleEmployment.be  This test is no t yet approved or cleared by the [place] FDA and  has been authorized for detection and/or diagnosis of SARS-CoV-2 by FDA under an Emergency Use Authorization (EUA). This EUA will remain  in effect (meaning this test can be used) for the duration of the COVID-19 declaration under Section 564(b)(1) of the  Act, 21 U.S.C.section 360bbb-3(b)(1), unless the authorization is terminated  or revoked sooner.       Influenza Hatice Bubel by PCR NEGATIVE NEGATIVE Final   Influenza B by PCR NEGATIVE NEGATIVE Final    Comment: (NOTE) The Xpert Xpress SARS-CoV-2/FLU/RSV plus assay is intended as an aid in the diagnosis of influenza from Nasopharyngeal swab specimens and should not be used as Kymberley Raz sole basis for treatment. Nasal washings and aspirates are unacceptable for Xpert Xpress SARS-CoV-2/FLU/RSV testing.  Fact Sheet for Patients: EntrepreneurPulse.com.au  Fact Sheet for Healthcare Providers: IncredibleEmployment.be  This test is not yet approved or cleared by the [place] FDA and has been authorized for detection and/or diagnosis of SARS-CoV-2 by FDA under an Emergency Use Authorization (EUA). This EUA will remain in effect (meaning this test can be used) for the duration of the COVID-19 declaration under Section 564(b)(1) of the Act, 21 U.S.C. section 360bbb-3(b)(1), unless the authorization is terminated or revoked.  Performed at  [facility], [place] [address], [place], [place] [address]          Radiology Studies: DG Chest 1 View  Result Date: 07/15/2020 CLINICAL DATA:  Left hip fracture following Matt Delpizzo fall. EXAM: CHEST  1 VIEW COMPARISON:  06/28/2020 FINDINGS: Stable enlarged cardiac silhouette and tortuous and calcified thoracic aorta. Stable left subclavian bipolar pacemaker and leads. Stable moderate-sized hiatal hernia. Decreased subsegmental atelectasis in the right mid to lower lung zone. The remainder of the lungs remain hyperexpanded with stable mild diffuse prominence of the interstitial markings. Left neck surgical clips. Cervical spine degenerative changes. L1 compression deformity without gross change. IMPRESSION: 1. No acute abnormality. 2. Decreased atelectasis on the right. 3. Stable cardiomegaly, changes of COPD and moderate-sized hiatal hernia. Electronically Signed   By: Claudie Revering M.D.   On: 07/15/2020 12:32   DG Lumbar Spine 2-3 Views  Result Date: 07/15/2020 CLINICAL DATA:  86 year old female with left hip deformity EXAM: LUMBAR SPINE - 2-3 VIEW COMPARISON:  06/30/2020 FINDINGS: Lumbar Spine: Osteopenia Lumbar vertebral elements maintain normal alignment without evidence of anterolisthesis, retrolisthesis, subluxation. Similar configuration of the compression fractures involving T12 superior endplate, L1, and L3 superior endplate when compared to the plain film of 06/30/2020. No new fracture line identified. Degenerative disc disease throughout the lumbar spine again noted, worst at the L5-S1 level with vacuum disc phenomenon and endplate changes. Facet disease is mild, worst at L5-S1. Aortic atherosclerosis.  Stent within the proximal SMA. IMPRESSION: No acute fracture or malalignment of the lumbar spine. Unchanged appearance of compression fracture of T12, L1, L3. Electronically Signed   By: Corrie Mckusick D.O.   On: 07/15/2020 12:30   CT Head Wo Contrast  Result Date: 07/15/2020 CLINICAL  DATA:  Fall EXAM: CT HEAD WITHOUT CONTRAST CT CERVICAL SPINE WITHOUT CONTRAST TECHNIQUE: Multidetector CT imaging of the head and cervical spine was performed following the standard protocol without intravenous contrast. Multiplanar CT image reconstructions of the cervical spine were also generated. COMPARISON:  None. FINDINGS: CT HEAD FINDINGS Brain: No evidence of acute infarction, hemorrhage, hydrocephalus, extra-axial collection or mass lesion/mass effect. Mild subcortical white matter and periventricular small vessel ischemic changes. Vascular: Intracranial atherosclerosis. Skull: Normal. Negative for fracture or focal lesion. Sinuses/Orbits: The visualized paranasal sinuses are essentially clear. Partial opacification of the right mastoid air cells. Left mastoid air cells are clear. Other: None. CT CERVICAL SPINE FINDINGS Motion degraded images. Alignment: Normal cervical lordosis. Skull base and vertebrae: No acute fracture. No primary bone lesion or focal pathologic  process. Soft tissues and spinal canal: No prevertebral fluid or swelling. No visible canal hematoma. Disc levels: Very mild degenerative changes at C3-4. Spinal canal is patent. Upper chest: Visualized lung apices are notable for centrilobular emphysematous changes. Other: None. IMPRESSION: No evidence of acute intracranial abnormality. Mild small vessel ischemic changes. No evidence of traumatic injury to the cervical spine. Electronically Signed   By: Julian Hy M.D.   On: 07/15/2020 12:16   CT Cervical Spine Wo Contrast  Result Date: 07/15/2020 CLINICAL DATA:  Fall EXAM: CT HEAD WITHOUT CONTRAST CT CERVICAL SPINE WITHOUT CONTRAST TECHNIQUE: Multidetector CT imaging of the head and cervical spine was performed following the standard protocol without intravenous contrast. Multiplanar CT image reconstructions of the cervical spine were also generated. COMPARISON:  None. FINDINGS: CT HEAD FINDINGS Brain: No evidence of acute  infarction, hemorrhage, hydrocephalus, extra-axial collection or mass lesion/mass effect. Mild subcortical white matter and periventricular small vessel ischemic changes. Vascular: Intracranial atherosclerosis. Skull: Normal. Negative for fracture or focal lesion. Sinuses/Orbits: The visualized paranasal sinuses are essentially clear. Partial opacification of the right mastoid air cells. Left mastoid air cells are clear. Other: None. CT CERVICAL SPINE FINDINGS Motion degraded images. Alignment: Normal cervical lordosis. Skull base and vertebrae: No acute fracture. No primary bone lesion or focal pathologic process. Soft tissues and spinal canal: No prevertebral fluid or swelling. No visible canal hematoma. Disc levels: Very mild degenerative changes at C3-4. Spinal canal is patent. Upper chest: Visualized lung apices are notable for centrilobular emphysematous changes. Other: None. IMPRESSION: No evidence of acute intracranial abnormality. Mild small vessel ischemic changes. No evidence of traumatic injury to the cervical spine. Electronically Signed   By: Julian Hy M.D.   On: 07/15/2020 12:16   DG HIP UNILAT WITH PELVIS 2-3 VIEWS LEFT  Result Date: 07/15/2020 CLINICAL DATA:  Left hip pain and deformity following Tierany Appleby fall. EXAM: DG HIP (WITH OR WITHOUT PELVIS) 2-3V LEFT COMPARISON:  11/07/2015 FINDINGS: Interval left femoral neck fracture with proximal displacement and varus angulation of the distal fragment. There is also lateral rotation of the femoral head fragment. No dislocation seen. Diffuse osteopenia. IMPRESSION: Interval left femoral neck fracture, as described above. Electronically Signed   By: Claudie Revering M.D.   On: 07/15/2020 12:35        Scheduled Meds: . acetaminophen  1,000 mg Oral Q8H  . arformoterol  15 mcg Nebulization BID  . clonazePAM  0.5 mg Oral QHS  . docusate sodium  100 mg Oral QHS  . donepezil  10 mg Oral QHS  . DULoxetine  60 mg Oral Daily  . furosemide  40 mg  Oral QODAY  . gabapentin  300 mg Oral TID  . levothyroxine  125 mcg Oral QAC breakfast  . methylPREDNISolone acetate  80 mg Intramuscular Once  . metoprolol tartrate  25 mg Oral Daily  . pantoprazole  40 mg Oral BID  . pravastatin  40 mg Oral Daily  . predniSONE  10 mg Oral Q breakfast  . senna  1 tablet Oral BID  . umeclidinium bromide  1 puff Inhalation Daily   Continuous Infusions: . sodium chloride 10 mL/hr at 07/16/20 0539  . heparin 900 Units/hr (07/16/20 0819)     LOS: 1 day    Time spent: over 30 min    Fayrene Helper, MD [facility]   To contact the attending provider between 7A-7P or the covering provider during after hours 7P-7A, please log into the web site [email or web address] and access using  universal [place] password for that web site. If you do not have the password, please call the hospital operator.  07/16/2020, 9:11 AM

## 2020-07-16 NOTE — ED Notes (Signed)
Pt states granddaughter is calling someone to call husband to update

## 2020-07-16 NOTE — Plan of Care (Signed)
  Problem: Health Behavior/Discharge Planning: Goal: Ability to manage health-related needs will improve Outcome: Progressing   Problem: Activity: Goal: Risk for activity intolerance will decrease Outcome: Progressing   Problem: Nutrition: Goal: Adequate nutrition will be maintained Outcome: Progressing   Problem: Coping: Goal: Level of anxiety will decrease Outcome: Progressing   Problem: Elimination: Goal: Will not experience complications related to bowel motility Outcome: Progressing   Problem: Pain Managment: Goal: General experience of comfort will improve Outcome: Progressing   Problem: Safety: Goal: Ability to remain free from injury will improve Outcome: Progressing   Problem: Skin Integrity: Goal: Risk for impaired skin integrity will decrease Outcome: Progressing

## 2020-07-16 NOTE — Plan of Care (Signed)

## 2020-07-16 NOTE — ED Notes (Signed)
Report called to Roj, RN

## 2020-07-16 NOTE — Progress Notes (Signed)
[place] for Heparin Indication: atrial fibrillation  Allergies  Allergen Reactions  . Silver Sulfadiazine Other (See Comments)    lowers white blood count     Patient Measurements: Height: 5\' 4"  (162.6 cm) Weight: 61.2 kg (135 lb) IBW/kg (Calculated) : 54.7 Heparin Dosing Weight: 61.2 kg  Vital Signs: Temp: 97.7 F (36.5 C) (12/31 1358) Temp Source: Oral (12/31 1358) BP: 111/75 (12/31 1358) Pulse Rate: 81 (12/31 1358)  Labs: Recent Labs    07/15/20 1237 07/16/20 0241 07/16/20 1421  HGB 9.7* 9.8*  --   HCT 34.8* 34.3*  --   PLT 191 161  --   APTT  --  80* 114*  HEPARINUNFRC  --  0.96*  --   CREATININE 1.05*  --   --     Estimated Creatinine Clearance: 34.4 mL/min (A) (by C-G formula based on SCr of 1.05 mg/dL (H)).   Medical History: Past Medical History:  Diagnosis Date  . Adenomatous colon polyp   . Arthritis   . CAD (coronary artery disease)    PTCA of RCA   . Carotid artery occlusion   . Cervical spine fracture ([place])   . COPD (chronic obstructive pulmonary disease) ([place])   . Depression   . Diverticulosis   . Gastritis 09/15/1991  . GERD (gastroesophageal reflux disease) 09/15/1991   Dr Sharlett Iles  . Hiatal hernia 09/15/1991  . Hip fracture, left, closed, initial encounter ([place]) 07/16/2020  . Hip fracture, right ([place])   . HLD (hyperlipidemia)   . HTN (hypertension)   . Hyperplastic polyps of stomach 11/2007   colonoscopy  . Hypertension   . Hypothyroidism    affecting the left eye, proptosis  . Iron deficiency anemia   . Lung cancer ([place])    s/p XRT  . Macular degeneration of left eye   . Memory loss   . Mesenteric artery stenosis ([place])   . Myocardial infarction ([place]) 1993  . Ocular myasthenia gravis ([place])    Dr Jannifer Franklin  . Orthostatic hypotension 06/05/2013  . PONV (postoperative nausea and vomiting)   . Presence of permanent cardiac pacemaker 08/2019  . Strabismus    left eye  . Syncope 1998     Medications:  Scheduled:  . acetaminophen  1,000 mg Oral Q8H  . arformoterol  15 mcg Nebulization BID  . clonazePAM  0.5 mg Oral QHS  . docusate sodium  100 mg Oral QHS  . donepezil  10 mg Oral QHS  . DULoxetine  60 mg Oral Daily  . feeding supplement  237 mL Oral BID BM  . furosemide  40 mg Oral QODAY  . gabapentin  300 mg Oral TID  . levothyroxine  125 mcg Oral QAC breakfast  . metoprolol tartrate  25 mg Oral Daily  . multivitamin with minerals  1 tablet Oral Daily  . pantoprazole  40 mg Oral BID  . pravastatin  40 mg Oral Daily  . predniSONE  10 mg Oral Q breakfast  . senna  1 tablet Oral BID  . umeclidinium bromide  1 puff Inhalation Daily    Assessment: Patient is a 28 yof that is being admitted for a hip fracture. The patient takes apixaban pta for afib which needs to be held for her hip surgery. Pharmacy has been asked to dose heparin at this time while holding her apixaban.  Confirmatory aPTT is above goal at 114 seconds. Heparin to stop at midnight tonight for OR 1/1.  Goal of Therapy:  aPTT 66-102 seconds Monitor platelets by anticoagulation protocol: Yes   Plan:  -Reduce heparin to 750 units/h -Stop heparin at midnight tonight -No further aPTT checks for now -F/U restart of heparin 1/1 post/op   Arrie Senate, PharmD, BCPS, [facility] Clinical Pharmacist [phone] Please check AMION for all [place] numbers 07/16/2020

## 2020-07-16 NOTE — Progress Notes (Signed)
[place] for Heparin Indication: atrial fibrillation  Allergies  Allergen Reactions  . Silver Sulfadiazine Other (See Comments)    lowers white blood count     Patient Measurements: Height: 5\' 4"  (162.6 cm) Weight: 61.2 kg (135 lb) IBW/kg (Calculated) : 54.7 Heparin Dosing Weight: 61.2 kg  Vital Signs: Temp: 98.1 F (36.7 C) (12/30 1626) Temp Source: Oral (12/30 1626) BP: 97/74 (12/31 0215) Pulse Rate: 88 (12/31 0215)  Labs: Recent Labs    07/15/20 1237 07/16/20 0241  HGB 9.7* 9.8*  HCT 34.8* 34.3*  PLT 191 161  APTT  --  80*  CREATININE 1.05*  --     Estimated Creatinine Clearance: 34.4 mL/min (A) (by C-G formula based on SCr of 1.05 mg/dL (H)).  Assessment: 86 y.o. female admitted with hip fracture, h/o Afib and Eliquis on hold, for heparin  Goal of Therapy:  aPTT 66-102 seconds Monitor platelets by anticoagulation protocol: Yes   Plan:  Continue Heparin at current rate    Brandi Raymond, Bronson Curb PharmD. BCPS  07/16/2020,4:09 AM

## 2020-07-16 NOTE — Consult Note (Signed)
   Providing Compassionate, Quality Care - Together  Neurosurgery Consult  Referring physician: Dr. Florene Glen Reason for referral: Compression fractures  Chief Complaint: Fall  History of Present Illness: This is a pleasant 86 year old female with a history of T12, L1, L3 compression fractures that unfortunately had a fall and now has a left hip fracture.  She is scheduled for reduction fixation of her hip fracture tomorrow morning.  She was being treated as an outpatient and plan for possible kyphoplasty this week.  She still complains of lumbar midline tenderness and worsening pain with ambulation and movement.  She is unsure of how she was diagnosed with her fractures if she had an MRI or bone scan.  She does have a history of osteoporosis.  She denies any bowel or bladder changes.  Some limited left lower extremity movement due to her hip fracture.   Medications: I have reviewed the patient's current medications. Allergies: No Known Allergies  History reviewed. No pertinent family history. Social History:  has no history on file for tobacco use, alcohol use, and drug use.  ROS: 14 point review of systems obtained which all pertinent positives and negatives are listed in HPI above  Physical Exam:  Vital signs in last 24 hours: Temp:  [98 F (36.7 C)-98.3 F (36.8 C)] 98 F (36.7 C) (07/25 1814) Pulse Rate:  [58-128] 65 (07/26 0746) Resp:  [11-18] 14 (07/26 0217) BP: (138-182)/(65-125) 153/88 (07/26 0700) SpO2:  [91 %-98 %] 96 % (07/26 0746) PE: A&O x2 PERRLA Moving all extremities well except for left lower extremity slightly limited due to hip fracture Sensory intact to light touch EOMI Appropriately conversant Tenderness to palpation in the midline L-spine   Impression/Assessment:  86 year old female with  1.  T12, L1, L3 compression fractures, osteoporotic  Plan:  -Okay from my standpoint for repair of hip fracture, holding Eliquis -Continue conservative measures  for compression fractures given her need for repair of hip fracture and therapy for this. -I discussed with the patient about following her conservatively and she agrees with that plan for now.  I will follow up with her after her hip repair when she is ambulatory -It appears her T12 compression fracture is new compared to previous x-rays, 05/21/2020 (diagnosed 06/30/2020)  Thank you for allowing me to participate in this patient's care.  Please do not hesitate to call with questions or concerns.   Elwin Sleight, [facility] Cell: [phone]

## 2020-07-16 NOTE — Progress Notes (Signed)
Initial Nutrition Assessment  DOCUMENTATION CODES:   Not applicable  INTERVENTION:   -MVI with minerals daily -Ensure Enlive po BID, each supplement provides 350 kcal and 20 grams of protein -Liberalize diet to regular  NUTRITION DIAGNOSIS:   Increased nutrient needs related to post-op healing as evidenced by estimated needs.  GOAL:   Patient will meet greater than or equal to 90% of their needs  MONITOR:   PO intake,Supplement acceptance,Labs,Weight trends,Skin,I & O's  REASON FOR ASSESSMENT:   Consult Assessment of nutrition requirement/status,Hip fracture protocol  ASSESSMENT:   Brandi Raymond is a 86 y.o. female with medical history significant of a fib, COPD, SCC right lung s/p XRT, GERD, Dementia, HTN, CAD, memory loss. She has multiple vertebral compressiosn fractrures and was scheduled for kyphoplasty. She was waiting for her grad-daughter to take to the doctor. She stood up, lost her balance and fell with subsequent pain and inability to bear weight. She comes to [facility] for evaluation.  Pt admitted with lt femora neck fracture.   Per orthopedics notes, plan for ORIF vs hemi on 07/17/20.   Pt unavailable at time of visit.   Pt just arrived to the unit. No meal intake documented at this time.   Pt with increased nutritional needs for post-operative healing and would benefit from addition of oral nutritional supplements.   Reviewed wt hx; wt has been stable over the past 3 months.   Medications reviewed and include lasix,  Senokot, and 0.9% sodium chloride infusion @ 10 ml/hr.   Labs reviewed.   Diet Order:   Diet Order            Diet NPO time specified  Diet effective midnight           Diet Heart Room service appropriate? Yes; Fluid consistency: Thin  Diet effective now                 EDUCATION NEEDS:   No education needs have been identified at this time  Skin:  Skin Assessment: Reviewed RN Assessment  Last BM:  07/16/20  Height:   Ht  Readings from Last 1 Encounters:  07/15/20 5\' 4"  (1.626 m)    Weight:   Wt Readings from Last 1 Encounters:  07/15/20 61.2 kg    Ideal Body Weight:  54.5 kg  BMI:  Body mass index is 23.17 kg/m.  Estimated Nutritional Needs:   Kcal:  1650-1850  Protein:  75-90 grams  Fluid:  > 1.6 L    Loistine Chance, RD, LDN, [place] Registered Dietitian II Certified Diabetes Care and Education Specialist Please refer to [facility] for RD and/or RD on-call/weekend/after hours pager

## 2020-07-17 ENCOUNTER — Inpatient Hospital Stay (HOSPITAL_COMMUNITY): Payer: PPO | Admitting: Anesthesiology

## 2020-07-17 ENCOUNTER — Encounter (HOSPITAL_COMMUNITY)
Admission: EM | Disposition: A | Discharge status: Skilled Nursing Facility | Payer: Self-pay | Source: Home / Self Care | Attending: Family Medicine

## 2020-07-17 ENCOUNTER — Inpatient Hospital Stay (HOSPITAL_COMMUNITY): Payer: PPO

## 2020-07-17 DIAGNOSIS — S72002A Fracture of unspecified part of neck of left femur, initial encounter for closed fracture: Secondary | ICD-10-CM | POA: Diagnosis not present

## 2020-07-17 DIAGNOSIS — I5031 Acute diastolic (congestive) heart failure: Secondary | ICD-10-CM | POA: Diagnosis not present

## 2020-07-17 DIAGNOSIS — E559 Vitamin D deficiency, unspecified: Secondary | ICD-10-CM | POA: Diagnosis not present

## 2020-07-17 DIAGNOSIS — E039 Hypothyroidism, unspecified: Secondary | ICD-10-CM | POA: Diagnosis not present

## 2020-07-17 DIAGNOSIS — I110 Hypertensive heart disease with heart failure: Secondary | ICD-10-CM | POA: Diagnosis not present

## 2020-07-17 DIAGNOSIS — M9702XA Periprosthetic fracture around internal prosthetic left hip joint, initial encounter: Secondary | ICD-10-CM | POA: Diagnosis not present

## 2020-07-17 HISTORY — PX: TOTAL HIP ARTHROPLASTY: SHX124

## 2020-07-17 LAB — CBC
HCT: 31 % — ABNORMAL LOW (ref 36.0–46.0)
Hemoglobin: 9.4 g/dL — ABNORMAL LOW (ref 12.0–15.0)
MCH: 26.6 pg (ref 26.0–34.0)
MCHC: 30.3 g/dL (ref 30.0–36.0)
MCV: 87.8 fL (ref 80.0–100.0)
Platelets: 138 10*3/uL — ABNORMAL LOW (ref 150–400)
RBC: 3.53 MIL/uL — ABNORMAL LOW (ref 3.87–5.11)
RDW: 17 % — ABNORMAL HIGH (ref 11.5–15.5)
WBC: 9.7 10*3/uL (ref 4.0–10.5)
nRBC: 0 % (ref 0.0–0.2)

## 2020-07-17 LAB — PHOSPHORUS: Phosphorus: 2.9 mg/dL (ref 2.5–4.6)

## 2020-07-17 SURGERY — ARTHROPLASTY, HIP, TOTAL, ANTERIOR APPROACH
Anesthesia: General | Site: Hip | Laterality: Left

## 2020-07-17 MED ORDER — BUPIVACAINE LIPOSOME 1.3 % IJ SUSP
20.0000 mL | INTRAMUSCULAR | Status: DC
  Filled 2020-07-17: qty 20

## 2020-07-17 MED ORDER — FENTANYL CITRATE (PF) 100 MCG/2ML IJ SOLN: 25.0000 ug | INTRAMUSCULAR | Status: DC | PRN

## 2020-07-17 MED ORDER — 0.9 % SODIUM CHLORIDE (POUR BTL) OPTIME
TOPICAL | Status: DC | PRN
  Administered 2020-07-17: 1000 mL

## 2020-07-17 MED ORDER — ACETAMINOPHEN 500 MG PO TABS
1000.0000 mg | ORAL_TABLET | Freq: Four times a day (QID) | ORAL | Status: AC
  Administered 2020-07-17 – 2020-07-18 (×3): 1000 mg via ORAL
  Filled 2020-07-17 (×4): qty 2

## 2020-07-17 MED ORDER — HYDROMORPHONE HCL 1 MG/ML IJ SOLN
0.5000 mg | INTRAMUSCULAR | Status: DC | PRN
  Administered 2020-07-17 – 2020-07-21 (×2): 1 mg via INTRAVENOUS
  Filled 2020-07-17 (×3): qty 1

## 2020-07-17 MED ORDER — FENTANYL CITRATE (PF) 250 MCG/5ML IJ SOLN
INTRAMUSCULAR | Status: DC | PRN
  Administered 2020-07-17 (×2): 25 ug via INTRAVENOUS
  Administered 2020-07-17 (×2): 50 ug via INTRAVENOUS

## 2020-07-17 MED ORDER — SORBITOL 70 % SOLN
30.0000 mL | Freq: Every day | Status: DC | PRN
  Filled 2020-07-17: qty 30

## 2020-07-17 MED ORDER — OXYCODONE HCL 5 MG PO TABS
5.0000 mg | ORAL_TABLET | ORAL | Status: DC | PRN
  Administered 2020-07-18 – 2020-07-19 (×4): 5 mg via ORAL
  Administered 2020-07-20: 10 mg via ORAL
  Administered 2020-07-21: 5 mg via ORAL
  Administered 2020-07-21 – 2020-07-22 (×2): 10 mg via ORAL
  Administered 2020-07-23 (×2): 5 mg via ORAL
  Administered 2020-07-23: 10 mg via ORAL
  Administered 2020-07-24: 5 mg via ORAL
  Filled 2020-07-17 (×2): qty 2
  Filled 2020-07-17 (×3): qty 1
  Filled 2020-07-17: qty 2
  Filled 2020-07-17 (×2): qty 1
  Filled 2020-07-17: qty 2
  Filled 2020-07-17: qty 1
  Filled 2020-07-17 (×2): qty 2
  Filled 2020-07-17 (×2): qty 1

## 2020-07-17 MED ORDER — METHOCARBAMOL 500 MG PO TABS
500.0000 mg | ORAL_TABLET | Freq: Four times a day (QID) | ORAL | Status: DC | PRN
  Administered 2020-07-17 – 2020-07-23 (×9): 500 mg via ORAL
  Filled 2020-07-17 (×9): qty 1

## 2020-07-17 MED ORDER — ACETAMINOPHEN 10 MG/ML IV SOLN
INTRAVENOUS | Status: AC
  Filled 2020-07-17: qty 100

## 2020-07-17 MED ORDER — ONDANSETRON HCL 4 MG/2ML IJ SOLN: 4.0000 mg | Freq: Four times a day (QID) | INTRAMUSCULAR | Status: DC | PRN

## 2020-07-17 MED ORDER — SODIUM CHLORIDE 0.9 % IR SOLN
Status: DC | PRN
  Administered 2020-07-17: 3000 mL

## 2020-07-17 MED ORDER — APIXABAN 2.5 MG PO TABS
2.5000 mg | ORAL_TABLET | Freq: Two times a day (BID) | ORAL | Status: DC
  Administered 2020-07-18 – 2020-07-24 (×13): 2.5 mg via ORAL
  Filled 2020-07-17 (×13): qty 1

## 2020-07-17 MED ORDER — ALUM & MAG HYDROXIDE-SIMETH 200-200-20 MG/5ML PO SUSP: 30.0000 mL | ORAL | Status: DC | PRN

## 2020-07-17 MED ORDER — METOPROLOL TARTRATE 5 MG/5ML IV SOLN
INTRAVENOUS | Status: DC | PRN
  Administered 2020-07-17: 2 mg via INTRAVENOUS

## 2020-07-17 MED ORDER — SUCCINYLCHOLINE CHLORIDE 20 MG/ML IJ SOLN
INTRAMUSCULAR | Status: DC | PRN
  Administered 2020-07-17: 100 mg via INTRAVENOUS

## 2020-07-17 MED ORDER — ROCURONIUM BROMIDE 100 MG/10ML IV SOLN
INTRAVENOUS | Status: DC | PRN
  Administered 2020-07-17: 50 mg via INTRAVENOUS

## 2020-07-17 MED ORDER — PROPOFOL 10 MG/ML IV BOLUS
INTRAVENOUS | Status: AC
  Filled 2020-07-17: qty 20

## 2020-07-17 MED ORDER — TRANEXAMIC ACID-NACL 1000-0.7 MG/100ML-% IV SOLN
INTRAVENOUS | Status: DC | PRN
  Administered 2020-07-17: 1000 mg via INTRAVENOUS

## 2020-07-17 MED ORDER — POLYETHYLENE GLYCOL 3350 17 G PO PACK
17.0000 g | PACK | Freq: Every day | ORAL | Status: DC | PRN
  Administered 2020-07-19 – 2020-07-20 (×2): 17 g via ORAL
  Filled 2020-07-17 (×2): qty 1

## 2020-07-17 MED ORDER — LACTATED RINGERS IV SOLN: INTRAVENOUS | Status: AC

## 2020-07-17 MED ORDER — DEXAMETHASONE SODIUM PHOSPHATE 10 MG/ML IJ SOLN
INTRAMUSCULAR | Status: AC
  Filled 2020-07-17: qty 1

## 2020-07-17 MED ORDER — SODIUM CHLORIDE 0.9 % IV SOLN
INTRAVENOUS | Status: DC | PRN
  Administered 2020-07-17: 40 mL

## 2020-07-17 MED ORDER — SUGAMMADEX SODIUM 200 MG/2ML IV SOLN
INTRAVENOUS | Status: DC | PRN
  Administered 2020-07-17: 50 mg via INTRAVENOUS
  Administered 2020-07-17: 150 mg via INTRAVENOUS

## 2020-07-17 MED ORDER — TRANEXAMIC ACID-NACL 1000-0.7 MG/100ML-% IV SOLN
INTRAVENOUS | Status: AC
  Filled 2020-07-17: qty 100

## 2020-07-17 MED ORDER — DOCUSATE SODIUM 100 MG PO CAPS
100.0000 mg | ORAL_CAPSULE | Freq: Two times a day (BID) | ORAL | Status: DC
  Administered 2020-07-17 – 2020-07-24 (×13): 100 mg via ORAL
  Filled 2020-07-17 (×14): qty 1

## 2020-07-17 MED ORDER — PHENYLEPHRINE HCL-NACL 10-0.9 MG/250ML-% IV SOLN
INTRAVENOUS | Status: DC | PRN
  Administered 2020-07-17: 25 ug/min via INTRAVENOUS

## 2020-07-17 MED ORDER — VANCOMYCIN HCL 1000 MG IV SOLR
INTRAVENOUS | Status: AC
  Filled 2020-07-17: qty 1000

## 2020-07-17 MED ORDER — LIDOCAINE 2% (20 MG/ML) 5 ML SYRINGE
INTRAMUSCULAR | Status: DC | PRN
  Administered 2020-07-17: 60 mg via INTRAVENOUS

## 2020-07-17 MED ORDER — ROCURONIUM BROMIDE 10 MG/ML (PF) SYRINGE
PREFILLED_SYRINGE | INTRAVENOUS | Status: AC
  Filled 2020-07-17: qty 10

## 2020-07-17 MED ORDER — MENTHOL 3 MG MT LOZG: 1.0000 | LOZENGE | OROMUCOSAL | Status: DC | PRN

## 2020-07-17 MED ORDER — FENTANYL CITRATE (PF) 250 MCG/5ML IJ SOLN
INTRAMUSCULAR | Status: AC
  Filled 2020-07-17: qty 5

## 2020-07-17 MED ORDER — IRRISEPT - 450ML BOTTLE WITH 0.05% CHG IN STERILE WATER, USP 99.95% OPTIME
TOPICAL | Status: DC | PRN
  Administered 2020-07-17: 450 mL

## 2020-07-17 MED ORDER — VANCOMYCIN HCL 1 G IV SOLR
INTRAVENOUS | Status: DC | PRN
  Administered 2020-07-17: 1000 mg via TOPICAL

## 2020-07-17 MED ORDER — SODIUM CHLORIDE 0.9% FLUSH: INTRAVENOUS | Status: DC | PRN

## 2020-07-17 MED ORDER — TRANEXAMIC ACID 1000 MG/10ML IV SOLN
INTRAVENOUS | Status: DC | PRN
  Administered 2020-07-17: 2000 mg via TOPICAL

## 2020-07-17 MED ORDER — ONDANSETRON HCL 4 MG PO TABS
4.0000 mg | ORAL_TABLET | Freq: Four times a day (QID) | ORAL | Status: DC | PRN
  Filled 2020-07-17: qty 1

## 2020-07-17 MED ORDER — LIDOCAINE 2% (20 MG/ML) 5 ML SYRINGE
INTRAMUSCULAR | Status: AC
  Filled 2020-07-17: qty 5

## 2020-07-17 MED ORDER — BUPIVACAINE-EPINEPHRINE (PF) 0.25% -1:200000 IJ SOLN
INTRAMUSCULAR | Status: AC
  Filled 2020-07-17: qty 20

## 2020-07-17 MED ORDER — CEFAZOLIN SODIUM-DEXTROSE 2-3 GM-%(50ML) IV SOLR
INTRAVENOUS | Status: DC | PRN
  Administered 2020-07-17: 2 g via INTRAVENOUS

## 2020-07-17 MED ORDER — OXYCODONE-ACETAMINOPHEN 5-325 MG PO TABS: 1.0000 | ORAL_TABLET | Freq: Three times a day (TID) | ORAL | 0 refills | Status: DC | PRN

## 2020-07-17 MED ORDER — CEFAZOLIN SODIUM-DEXTROSE 2-4 GM/100ML-% IV SOLN
2.0000 g | Freq: Two times a day (BID) | INTRAVENOUS | Status: AC
  Administered 2020-07-17 – 2020-07-18 (×3): 2 g via INTRAVENOUS
  Filled 2020-07-17 (×3): qty 100

## 2020-07-17 MED ORDER — BUPIVACAINE-EPINEPHRINE 0.25% -1:200000 IJ SOLN
INTRAMUSCULAR | Status: DC | PRN
  Administered 2020-07-17: 20 mL

## 2020-07-17 MED ORDER — LACTATED RINGERS IV SOLN: INTRAVENOUS | Status: DC | PRN

## 2020-07-17 MED ORDER — OXYCODONE HCL 5 MG PO TABS
10.0000 mg | ORAL_TABLET | ORAL | Status: DC | PRN
  Administered 2020-07-20 – 2020-07-22 (×2): 10 mg via ORAL

## 2020-07-17 MED ORDER — PHENOL 1.4 % MT LIQD: 1.0000 | OROMUCOSAL | Status: DC | PRN

## 2020-07-17 MED ORDER — MAGNESIUM CITRATE PO SOLN: 1.0000 | Freq: Once | ORAL | Status: DC | PRN

## 2020-07-17 MED ORDER — TRANEXAMIC ACID 1000 MG/10ML IV SOLN
2000.0000 mg | INTRAVENOUS | Status: DC
  Filled 2020-07-17: qty 20

## 2020-07-17 MED ORDER — ONDANSETRON HCL 4 MG/2ML IJ SOLN: 4.0000 mg | Freq: Once | INTRAMUSCULAR | Status: DC | PRN

## 2020-07-17 MED ORDER — SODIUM CHLORIDE 0.9 % IV SOLN: INTRAVENOUS | Status: DC

## 2020-07-17 MED ORDER — ONDANSETRON HCL 4 MG/2ML IJ SOLN
INTRAMUSCULAR | Status: AC
  Filled 2020-07-17: qty 2

## 2020-07-17 MED ORDER — METHOCARBAMOL 1000 MG/10ML IJ SOLN
500.0000 mg | Freq: Four times a day (QID) | INTRAVENOUS | Status: DC | PRN
  Filled 2020-07-17: qty 5

## 2020-07-17 MED ORDER — DEXAMETHASONE SODIUM PHOSPHATE 10 MG/ML IJ SOLN
INTRAMUSCULAR | Status: DC | PRN
  Administered 2020-07-17: 3 mg via INTRAVENOUS
  Administered 2020-07-17: 5 mg via INTRAVENOUS

## 2020-07-17 MED ORDER — ONDANSETRON HCL 4 MG/2ML IJ SOLN
INTRAMUSCULAR | Status: DC | PRN
  Administered 2020-07-17: 4 mg via INTRAVENOUS

## 2020-07-17 MED ORDER — ACETAMINOPHEN 325 MG PO TABS
325.0000 mg | ORAL_TABLET | Freq: Four times a day (QID) | ORAL | Status: DC | PRN
  Administered 2020-07-18 – 2020-07-23 (×3): 650 mg via ORAL
  Filled 2020-07-17 (×3): qty 2

## 2020-07-17 MED ORDER — PROPOFOL 10 MG/ML IV BOLUS
INTRAVENOUS | Status: DC | PRN
  Administered 2020-07-17: 70 mg via INTRAVENOUS

## 2020-07-17 MED ORDER — PHENYLEPHRINE 40 MCG/ML (10ML) SYRINGE FOR IV PUSH (FOR BLOOD PRESSURE SUPPORT)
PREFILLED_SYRINGE | INTRAVENOUS | Status: DC | PRN
  Administered 2020-07-17: 120 ug via INTRAVENOUS

## 2020-07-17 MED ORDER — ACETAMINOPHEN 10 MG/ML IV SOLN
INTRAVENOUS | Status: DC | PRN
  Administered 2020-07-17: 1000 mg via INTRAVENOUS

## 2020-07-17 SURGICAL SUPPLY — 56 items
BAG DECANTER FOR FLEXI CONT (MISCELLANEOUS) ×3 IMPLANT
BIPOLAR DEPUY 47 (Hips) ×2 IMPLANT
BIPOLAR DEPUY 47MM (Hips) ×1 IMPLANT
COVER PERINEAL POST (MISCELLANEOUS) ×3 IMPLANT
COVER SURGICAL LIGHT HANDLE (MISCELLANEOUS) ×3 IMPLANT
DERMABOND ADVANCED (GAUZE/BANDAGES/DRESSINGS) ×2
DERMABOND ADVANCED .7 DNX12 (GAUZE/BANDAGES/DRESSINGS) IMPLANT
DRAPE C-ARM 42X72 X-RAY (DRAPES) ×3 IMPLANT
DRAPE POUCH INSTRU U-SHP 10X18 (DRAPES) ×3 IMPLANT
DRAPE STERI IOBAN 125X83 (DRAPES) ×3 IMPLANT
DRAPE U-SHAPE 47X51 STRL (DRAPES) ×8 IMPLANT
DRSG AQUACEL AG ADV 3.5X10 (GAUZE/BANDAGES/DRESSINGS) ×3 IMPLANT
DURAPREP 26ML APPLICATOR (WOUND CARE) ×6 IMPLANT
ELECT BLADE 4.0 EZ CLEAN MEGAD (MISCELLANEOUS) ×3
ELECT REM PT RETURN 9FT ADLT (ELECTROSURGICAL) ×3
ELECTRODE BLDE 4.0 EZ CLN MEGD (MISCELLANEOUS) ×1 IMPLANT
ELECTRODE REM PT RTRN 9FT ADLT (ELECTROSURGICAL) ×1 IMPLANT
GLOVE BIOGEL PI IND STRL 7.0 (GLOVE) ×1 IMPLANT
GLOVE BIOGEL PI INDICATOR 7.0 (GLOVE) ×2
GLOVE ECLIPSE 7.0 STRL STRAW (GLOVE) ×6 IMPLANT
GLOVE SKINSENSE NS SZ7.5 (GLOVE) ×2
GLOVE SKINSENSE STRL SZ7.5 (GLOVE) ×1 IMPLANT
GLOVE SURG SYN 7.5  E (GLOVE) ×12
GLOVE SURG SYN 7.5 E (GLOVE) ×4 IMPLANT
GLOVE SURG SYN 7.5 PF PI (GLOVE) ×4 IMPLANT
GOWN STRL REIN XL XLG (GOWN DISPOSABLE) ×3 IMPLANT
GOWN STRL REUS W/ TWL XL LVL3 (GOWN DISPOSABLE) ×1 IMPLANT
GOWN STRL REUS W/TWL XL LVL3 (GOWN DISPOSABLE) ×6
HANDPIECE INTERPULSE COAX TIP (DISPOSABLE) ×3
HEAD BIPOLAR DEPUY 47 (Hips) IMPLANT
HEAD FEM STD 28X+1.5 STRL (Hips) ×2 IMPLANT
HOOD PEEL AWAY FLYTE STAYCOOL (MISCELLANEOUS) ×6 IMPLANT
IV NS IRRIG 3000ML ARTHROMATIC (IV SOLUTION) ×3 IMPLANT
JET LAVAGE IRRISEPT WOUND (IRRIGATION / IRRIGATOR) ×3
KIT BASIN OR (CUSTOM PROCEDURE TRAY) ×3 IMPLANT
LAVAGE JET IRRISEPT WOUND (IRRIGATION / IRRIGATOR) ×1 IMPLANT
MARKER SKIN DUAL TIP RULER LAB (MISCELLANEOUS) ×3 IMPLANT
NDL SPNL 18GX3.5 QUINCKE PK (NEEDLE) ×1 IMPLANT
NEEDLE SPNL 18GX3.5 QUINCKE PK (NEEDLE) ×3 IMPLANT
PACK TOTAL JOINT (CUSTOM PROCEDURE TRAY) ×3 IMPLANT
PACK UNIVERSAL I (CUSTOM PROCEDURE TRAY) ×3 IMPLANT
SAW OSC TIP CART 19.5X105X1.3 (SAW) ×3 IMPLANT
SET HNDPC FAN SPRY TIP SCT (DISPOSABLE) ×1 IMPLANT
STAPLER VISISTAT 35W (STAPLE) IMPLANT
STEM FEM ACTIS STD SZ4 (Stem) ×2 IMPLANT
SUT ETHIBOND 2 V 37 (SUTURE) ×3 IMPLANT
SUT ETHILON 2 0 FS 18 (SUTURE) ×4 IMPLANT
SUT VIC AB 0 CT1 27 (SUTURE) ×3
SUT VIC AB 0 CT1 27XBRD ANBCTR (SUTURE) ×1 IMPLANT
SUT VIC AB 1 CTX 36 (SUTURE) ×3
SUT VIC AB 1 CTX36XBRD ANBCTR (SUTURE) ×1 IMPLANT
SUT VIC AB 2-0 CT1 27 (SUTURE) ×6
SUT VIC AB 2-0 CT1 TAPERPNT 27 (SUTURE) ×2 IMPLANT
SYR 50ML LL SCALE MARK (SYRINGE) ×3 IMPLANT
TOWEL GREEN STERILE (TOWEL DISPOSABLE) ×3 IMPLANT
YANKAUER SUCT BULB TIP NO VENT (SUCTIONS) ×3 IMPLANT

## 2020-07-17 NOTE — Anesthesia Procedure Notes (Signed)
Procedure Name: Intubation Date/Time: 07/17/2020 7:48 AM Performed by: Janene Harvey, CRNA Pre-anesthesia Checklist: Patient identified, Emergency Drugs available, Suction available and Patient being monitored Patient Re-evaluated:Patient Re-evaluated prior to induction Oxygen Delivery Method: Circle system utilized Preoxygenation: Pre-oxygenation with 100% oxygen Induction Type: IV induction Ventilation: Mask ventilation without difficulty Laryngoscope Size: Mac and 4 Grade View: Grade I Tube type: Oral Tube size: 7.0 mm Number of attempts: 1 Airway Equipment and Method: Stylet and Oral airway Placement Confirmation: ETT inserted through vocal cords under direct vision,  positive ETCO2 and breath sounds checked- equal and bilateral Secured at: 22 cm Tube secured with: Tape Dental Injury: Teeth and Oropharynx as per pre-operative assessment

## 2020-07-17 NOTE — Anesthesia Preprocedure Evaluation (Addendum)
Anesthesia Evaluation  Patient identified by MRN, date of birth, ID band Patient awake    Reviewed: Allergy & Precautions, NPO status , Patient's Chart, lab work & pertinent test results, reviewed documented beta blocker date and time   History of Anesthesia Complications (+) PONV and history of anesthetic complications  Airway Mallampati: II  TM Distance: >3 FB Neck ROM: Full    Dental  (+) Dental Advisory Given, Edentulous Lower, Edentulous Upper   Pulmonary COPD,  COPD inhaler and oxygen dependent, former smoker,  SCC right lung s/p XRT   Pulmonary exam normal breath sounds clear to auscultation       Cardiovascular hypertension, Pt. on home beta blockers (-) angina+ CAD (PTCA of RCA), + Past MI, + Peripheral Vascular Disease and +CHF  (-) Cardiac Stents Normal cardiovascular exam+ dysrhythmias + pacemaker  Rhythm:Regular Rate:Normal     Neuro/Psych PSYCHIATRIC DISORDERS Depression  Neuromuscular disease    GI/Hepatic Neg liver ROS, hiatal hernia, GERD  Medicated,  Endo/Other  Hypothyroidism   Renal/GU negative Renal ROS     Musculoskeletal  (+) Arthritis , Left Hip Fracture   Abdominal   Peds  Hematology  (+) Blood dyscrasia (Eliquis), anemia ,   Anesthesia Other Findings Day of surgery medications reviewed with the patient.  Reproductive/Obstetrics                           Anesthesia Physical Anesthesia Plan  ASA: III  Anesthesia Plan: General   Post-op Pain Management:    Induction: Intravenous  PONV Risk Score and Plan: 4 or greater and Dexamethasone, Ondansetron, Propofol infusion and Treatment may vary due to age or medical condition  Airway Management Planned: Oral ETT  Additional Equipment:   Intra-op Plan:   Post-operative Plan: Extubation in OR  Informed Consent: I have reviewed the patients History and Physical, chart, labs and discussed the procedure including  the risks, benefits and alternatives for the proposed anesthesia with the patient or authorized representative who has indicated his/her understanding and acceptance.   Patient has DNR.  Discussed DNR with patient and Suspend DNR.   Dental advisory given  Plan Discussed with: CRNA  Anesthesia Plan Comments:        Anesthesia Quick Evaluation

## 2020-07-17 NOTE — Transfer of Care (Signed)
Immediate Anesthesia Transfer of Care Note  Patient: Brandi Raymond  Procedure(s) Performed: HEMI HIP ARTHROPLASTY ANTERIOR APPROACH (Left Hip)  Patient Location: PACU  Anesthesia Type:General  Level of Consciousness: drowsy and patient cooperative  Airway & Oxygen Therapy: Patient Spontanous Breathing and Patient connected to face mask oxygen  Post-op Assessment: Report given to RN and Post -op Vital signs reviewed and stable  Post vital signs: Reviewed  Last Vitals:  Vitals Value Taken Time  BP 172/80 07/17/20 0937  Temp 36.4 C 07/17/20 0937  Pulse 79 07/17/20 0941  Resp 20 07/17/20 0941  SpO2 100 % 07/17/20 0941  Vitals shown include unvalidated device data.  Last Pain:  Vitals:   07/17/20 0411  TempSrc: Oral  PainSc:       Patients Stated Pain Goal: 1 (53/61/44 3154)  Complications: No complications documented.

## 2020-07-17 NOTE — Anesthesia Postprocedure Evaluation (Signed)
Anesthesia Post Note  Patient: Brandi Raymond  Procedure(s) Performed: HEMI HIP ARTHROPLASTY ANTERIOR APPROACH (Left Hip)     Patient location during evaluation: PACU Anesthesia Type: General Level of consciousness: awake and alert Pain management: pain level controlled Vital Signs Assessment: post-procedure vital signs reviewed and stable Respiratory status: spontaneous breathing, nonlabored ventilation, respiratory function stable and patient connected to nasal cannula oxygen Cardiovascular status: blood pressure returned to baseline and stable Postop Assessment: no apparent nausea or vomiting Anesthetic complications: no   No complications documented.  Last Vitals:  Vitals:   07/17/20 1400 07/17/20 1800  BP: 120/66 113/65  Pulse: 86 70  Resp: 16 15  Temp: 36.4 C 36.6 C  SpO2: 95% 99%    Last Pain:  Vitals:   07/17/20 1828  TempSrc:   PainSc: 0-No pain                 Catalina Gravel

## 2020-07-17 NOTE — Plan of Care (Signed)

## 2020-07-17 NOTE — H&P (Signed)

## 2020-07-17 NOTE — Evaluation (Signed)
Physical Therapy Evaluation Patient Details Name: Brandi Raymond MRN: [id number] DOB: 15-Feb-1934 Today's Date: 07/17/2020   History of Present Illness  The pt is an 87 yo female presenting s/p hemi arthroplasty of L hip, anterior approach, to repair L hip fx sustained in a fall at home. PMH includes: arthritis, CAD, COPD on 3L O2 at baseline, GERD, HTN, HLD, and memory loss.    Clinical Impression  Pt in bed upon arrival of PT, agreeable to evaluation at this time. Prior to admission the pt was mobilizing with rollator in the home, reports intermittent assist from husband for ADLs. The pt now presents with limitations in functional mobility, strength, power, stability, safety awareness, and endurance due to above dx and resulting pain, and will continue to benefit from skilled PT to address these deficits. The pt was able to demo good initial bed mobility, needing minA and verbal cues only to come to sitting EOB. The pt was then able to complete multiple sit-stand transfers with modA and use of RW, and complete short bout of walking in the room. The pt did fatigue following ~30 ft ambulation in the room, and require modA to maintain upright, positioning in RW, and manage O2 at this time. The pt will continue to benefit from skilled PT and benefit from short stint of rehab prior to return home to improve stability, strength, and endurance, to reduce risk of falls and dependence on caregivers.      Follow Up Recommendations SNF;Supervision/Assistance - 24 hour    Equipment Recommendations  None recommended by PT    Recommendations for Other Services       Precautions / Restrictions Precautions Precautions: Anterior Hip;Fall Precaution Comments: discussed verbally Restrictions Weight Bearing Restrictions: Yes LLE Weight Bearing: Weight bearing as tolerated      Mobility  Bed Mobility Overal bed mobility: Needs Assistance Bed Mobility: Supine to Sit;Sit to Supine     Supine to sit:  Min guard Sit to supine: Min assist   General bed mobility comments: minG with increased time to come to sitting from supine, heavy use of bed rails and cues, minA to control lower to supine, minA to bring LLE into bed    Transfers Overall transfer level: Needs assistance Equipment used: Rolling walker (2 wheeled) Transfers: Sit to/from Stand Sit to Stand: Min assist         General transfer comment: minA to power up and steady with BUE support on RW  Ambulation/Gait Ambulation/Gait assistance: Mod assist Gait Distance (Feet): 25 Feet Assistive device: Rolling walker (2 wheeled) Gait Pattern/deviations: Step-to pattern;Decreased step length - left;Leaning posteriorly Gait velocity: decreased Gait velocity interpretation: <1.31 ft/sec, indicative of household ambulator General Gait Details: small step-to, decreased advancement of LLE until cued, pt frequently leaving LLE outside of RW with steps until cued to reposition. posterior lean requiring modA to steady      Balance Overall balance assessment: Needs assistance Sitting-balance support: No upper extremity supported;Feet supported Sitting balance-Leahy Scale: Fair   Postural control: Posterior lean Standing balance support: Bilateral upper extremity supported Standing balance-Leahy Scale: Poor Standing balance comment: reliant on UE support                             Pertinent Vitals/Pain Pain Assessment: Faces Faces Pain Scale: Hurts even more Pain Location: L hip Pain Descriptors / Indicators: Discomfort;Grimacing;Sore Pain Intervention(s): Monitored during session;Limited activity within patient's tolerance;Repositioned    Home Living Family/patient expects to  be discharged to:: Private residence Living Arrangements: Spouse/significant other Available Help at Discharge: Family;Available 24 hours/day (after monday 07/19/20) Type of Home: House Home Access: Stairs to enter Entrance Stairs-Rails:  Right Entrance Stairs-Number of Steps: 3 Home Layout: Two level;Able to live on main level with bedroom/bathroom Home Equipment: Walker - 4 wheels;Bedside commode;Walker - 2 wheels;Grab bars - tub/shower;Shower seat Additional Comments: pt uses rollator that technially belongs to spouse    Prior Function Level of Independence: Independent with assistive device(s)         Comments: O2 condenser in home, uses rollator for mobility     Hand Dominance   Dominant Hand: Right    Extremity/Trunk Assessment   Upper Extremity Assessment Upper Extremity Assessment: Generalized weakness    Lower Extremity Assessment Lower Extremity Assessment: Generalized weakness    Cervical / Trunk Assessment Cervical / Trunk Assessment: Kyphotic  Communication   Communication: No difficulties  Cognition Arousal/Alertness: Awake/alert Behavior During Therapy: WFL for tasks assessed/performed Overall Cognitive Status: Within Functional Limits for tasks assessed                                 General Comments: slight decrease in safety awareness and technique with RW, but agreeable to all cues/commands through session      General Comments General comments (skin integrity, edema, etc.): VSS on 3L O2    Exercises     Assessment/Plan    PT Assessment Patient needs continued PT services  PT Problem List Decreased strength;Decreased activity tolerance;Decreased balance;Decreased mobility;Decreased coordination;Decreased cognition;Decreased safety awareness;Pain       PT Treatment Interventions DME instruction;Gait training;Stair training;Functional mobility training;Therapeutic activities;Therapeutic exercise;Balance training;Patient/family education    PT Goals (Current goals can be found in the Care Plan section)  Acute Rehab PT Goals Patient Stated Goal: walk with rollator PT Goal Formulation: With patient Time For Goal Achievement: 07/31/20 Potential to Achieve Goals:  Good    Frequency Min 3X/week   Barriers to discharge Decreased caregiver support husband using RW as well, unable to provide physical assist       AM-PAC PT "6 Clicks" Mobility  Outcome Measure Help needed turning from your back to your side while in a flat bed without using bedrails?: A Little Help needed moving from lying on your back to sitting on the side of a flat bed without using bedrails?: A Little Help needed moving to and from a bed to a chair (including a wheelchair)?: A Little Help needed standing up from a chair using your arms (e.g., wheelchair or bedside chair)?: A Little Help needed to walk in hospital room?: A Lot Help needed climbing 3-5 steps with a railing? : Total 6 Click Score: 15    End of Session Equipment Utilized During Treatment: Gait belt;Oxygen Activity Tolerance: Patient tolerated treatment well;Patient limited by fatigue;Patient limited by pain Patient left: in bed;with call bell/phone within reach;with family/visitor present Nurse Communication: Mobility status PT Visit Diagnosis: Unsteadiness on feet (R26.81);Other abnormalities of gait and mobility (R26.89);Muscle weakness (generalized) (M62.81);Pain Pain - Right/Left: Left Pain - part of body: Hip    Time: 5462-7035 PT Time Calculation (min) (ACUTE ONLY): 30 min   Charges:   PT Evaluation $PT Eval Moderate Complexity: 1 Mod PT Treatments $Gait Training: 8-22 mins        Karma Ganja, PT, DPT   Acute Rehabilitation Department Pager #: [phone]  Otho Bellows 07/17/2020, 4:42 PM

## 2020-07-17 NOTE — Progress Notes (Signed)
PROGRESS NOTE    Brandi Raymond  HTD:[id number] DOB: Dec 02, 1933 DOA: 07/15/2020 PCP: Binnie Rail, MD   Chief Complaint  Patient presents with  . Fall    Brief Narrative:  Brandi Raymond is Brandi Raymond 87 y.o. female with medical history significant of Cadin Luka fib, COPD, SCC right lung s/p XRT, GERD, Dementia, HTN, CAD, memory loss. She has multiple vertebral compressiosn fractrures and was scheduled for kyphoplasty. She was waiting for her grad-daughter to take to the doctor. She stood up, lost her balance and fell with subsequent pain and inability to bear weight. She comes to [facility] for evaluation.  ED Course: T 98.1  141/111  HR 81  RR 18, on O2. Cmet nl, Hgb 9.7 (chronic anemia). CXR NAD, emphysema, hiatal hernia. Xray left hip with impacted intertrochanteric fx. Pt seen by ortho and is for surgery 07/17/20. TRH called to admit, clear for surgery and manage medical issues.   Assessment & Plan:   Principal Problem:   Displaced fracture of left femoral neck (HCC) Active Problems:   Hypothyroidism   HYPERLIPIDEMIA   Essential hypertension   COPD (chronic obstructive pulmonary disease) with emphysema (HCC)   Anemia   Chronic mesenteric ischemia (HCC)   GERD (gastroesophageal reflux disease)   Paroxysmal atrial fibrillation (HCC)   Closed left hip fracture ([place])  1. Left femoral neck fracture - after mechanical fall - s/p prosthetic replacement for femoral neck fracture 07/17/2020 - heparin gtt held preoperatively - will defer resumption of therapeutic anticoagulation to orthopedics - scheduled APAP, oxy, morphine prn - bowel regimen  2. COPD - oxygen dependent on 3 L at home. Currently stable Continue incruse ellipta, prn atrovent, brovana On chronic prednisone -> stress dose steroids today, will resume home steroid dose tomorrow  3. Compression fx's spine - active problem. Was for kyphoplasty - now delayed to #1 Compression fx of T12, L1, L3 on imaging Discussed with neurosurgery    4. Atrial Fibrillation with RVR - currently in the 130's-150's - fluctuating pretty widely Follow with home PO metop Metop IV prn for sustained HR > 120 Anticoagulation on hold post op - will resume when ok per ortho   # Acute Kidney Injury: baseline creatinine is around 1.  Creatinine up today.  Hold lasix.  Will give gentle fluids overnight.  5. Anemia - chronic problem - stable - follow post op   6. Hypothyroidism- continue home meds.  Follow TSH (wnl)  7. Memory loss - continue home meds.   8. Oncology - follows with Dr. Earlie Server for [place] right lung s/p xrt question of recurrence per grand-daughter. Currently stable.  # Hypotension: resolved,  DVT prophylaxis: heparin  Code Status: dnr Family Communication: grandaughter at bedside Disposition:   Status is: Inpatient  Remains inpatient appropriate because:Inpatient level of care appropriate due to severity of illness   Dispo: The patient is from: Home              Anticipated d/c is to: pending              Anticipated d/c date is: > 3 days              Patient currently is not medically stable to d/c.  Consultants:   Ortho  neurosurgery  Procedures:   none  Antimicrobials: Anti-infectives (From admission, onward)   Start     Dose/Rate Route Frequency Ordered Stop   07/17/20 2000  ceFAZolin (ANCEF) IVPB 2g/100 mL premix  2 g 200 mL/hr over 30 Minutes Intravenous Every 12 hours 07/17/20 1233 07/19/20 0759   07/17/20 0949  vancomycin (VANCOCIN) powder  Status:  Discontinued          As needed 07/17/20 0949 07/17/20 0949   07/16/20 0945  ceFAZolin (ANCEF) IVPB 2g/100 mL premix       Note to Pharmacy: Anesthesia to give preop   2 g 200 mL/hr over 30 Minutes Intravenous  Once 07/16/20 0941 07/16/20 1107        Subjective: Drowsy after surgery  Objective: Vitals:   07/17/20 1100 07/17/20 1200 07/17/20 1300 07/17/20 1400  BP: (!) 163/66 126/72 (!) 161/88 120/66  Pulse: 69 80 81 86  Resp: 15  16 15 16   Temp: 97.8 F (36.6 C) 97.8 F (36.6 C) 97.7 F (36.5 C) 97.6 F (36.4 C)  TempSrc: Oral Oral Oral Axillary  SpO2: 96% 95% 95% 95%  Weight:      Height:        Intake/Output Summary (Last 24 hours) at 07/17/2020 1702 Last data filed at 07/17/2020 1410 Gross per 24 hour  Intake 1406.77 ml  Output 100 ml  Net 1306.77 ml   Filed Weights   07/15/20 1129  Weight: 61.2 kg    Examination:  General: No acute distress. Cardiovascular: Heart sounds show Brandi Raymond regular rate, and rhythm. Lungs: Clear to auscultation bilaterally  Abdomen: Soft, nontender, nondistended Neurological: Alert and oriented 3. Moves all extremities 4. Cranial nerves II through XII grossly intact. Skin: Warm and dry. No rashes or lesions. Extremities: LLE with intact dressing     Data Reviewed: I have personally reviewed following labs and imaging studies  CBC: Recent Labs  Lab 07/15/20 1237 07/16/20 0241 07/17/20 0245  WBC 8.5 9.6 9.7  NEUTROABS 7.1  --   --   HGB 9.7* 9.8* 9.4*  HCT 34.8* 34.3* 31.0*  MCV 90.6 89.6 87.8  PLT 191 161 138*    Basic Metabolic Panel: Recent Labs  Lab 07/15/20 1237 07/16/20 1852  NA 138 135  K 4.7 4.2  CL 101 98  CO2 27 23  GLUCOSE 102* 128*  BUN 20 23  CREATININE 1.05* 1.34*  CALCIUM 8.3* 7.2*    GFR: Estimated Creatinine Clearance: 27 mL/min (Dystany Duffy) (by C-G formula based on SCr of 1.34 mg/dL (H)).  Liver Function Tests: Recent Labs  Lab 07/15/20 1237 07/16/20 1852  AST 26 35  ALT 21 18  ALKPHOS 112 103  BILITOT 0.4 0.7  PROT 6.1* 5.9*  ALBUMIN 3.2* 2.9*    CBG: No results for input(s): GLUCAP in the last 168 hours.   Recent Results (from the past 240 hour(s))  Resp Panel by RT-PCR (Flu Torrie Lafavor&B, Covid) Nasopharyngeal Swab     Status: None   Collection Time: 07/15/20 12:45 PM   Specimen: Nasopharyngeal Swab; Nasopharyngeal(NP) swabs in vial transport medium  Result Value Ref Range Status   SARS Coronavirus 2 by RT PCR NEGATIVE NEGATIVE  Final    Comment: (NOTE) SARS-CoV-2 target nucleic acids are NOT DETECTED.  The SARS-CoV-2 RNA is generally detectable in upper respiratory specimens during the acute phase of infection. The lowest concentration of SARS-CoV-2 viral copies this assay can detect is 138 copies/mL. Skylinn Vialpando negative result does not preclude SARS-Cov-2 infection and should not be used as the sole basis for treatment or other patient management decisions. Milina Pagett negative result may occur with  improper specimen collection/handling, submission of specimen other than nasopharyngeal swab, presence of viral mutation(s) within the areas  targeted by this assay, and inadequate number of viral copies(<138 copies/mL). Theone Bowell negative result must be combined with clinical observations, patient history, and epidemiological information. The expected result is Negative.  Fact Sheet for Patients:  EntrepreneurPulse.com.au  Fact Sheet for Healthcare Providers:  IncredibleEmployment.be  This test is no t yet approved or cleared by the [place] FDA and  has been authorized for detection and/or diagnosis of SARS-CoV-2 by FDA under an Emergency Use Authorization (EUA). This EUA will remain  in effect (meaning this test can be used) for the duration of the COVID-19 declaration under Section 564(b)(1) of the Act, 21 U.S.C.section 360bbb-3(b)(1), unless the authorization is terminated  or revoked sooner.       Influenza Cira Deyoe by PCR NEGATIVE NEGATIVE Final   Influenza B by PCR NEGATIVE NEGATIVE Final    Comment: (NOTE) The Xpert Xpress SARS-CoV-2/FLU/RSV plus assay is intended as an aid in the diagnosis of influenza from Nasopharyngeal swab specimens and should not be used as Kalisa Girtman sole basis for treatment. Nasal washings and aspirates are unacceptable for Xpert Xpress SARS-CoV-2/FLU/RSV testing.  Fact Sheet for Patients: EntrepreneurPulse.com.au  Fact Sheet for Healthcare  Providers: IncredibleEmployment.be  This test is not yet approved or cleared by the [place] FDA and has been authorized for detection and/or diagnosis of SARS-CoV-2 by FDA under an Emergency Use Authorization (EUA). This EUA will remain in effect (meaning this test can be used) for the duration of the COVID-19 declaration under Section 564(b)(1) of the Act, 21 U.S.C. section 360bbb-3(b)(1), unless the authorization is terminated or revoked.  Performed at [facility], [place] [address] [place], [place] [address]   Surgical pcr screen     Status: None   Collection Time: 07/16/20  4:29 PM   Specimen: Nasal Mucosa; Nasal Swab  Result Value Ref Range Status   MRSA, PCR NEGATIVE NEGATIVE Final   Staphylococcus aureus NEGATIVE NEGATIVE Final    Comment: (NOTE) The Xpert SA Assay (FDA approved for NASAL specimens in patients 9 years of age and older), is one component of Treyvin Glidden comprehensive surveillance program. It is not intended to diagnose infection nor to guide or monitor treatment. Performed at [facility], [place] [address], [place], [place] [address]          Radiology Studies: DG Pelvis Portable  Result Date: 07/17/2020 CLINICAL DATA:  87 year old female status post hip surgery for femoral neck fracture. EXAM: PORTABLE PELVIS 1-2 VIEWS COMPARISON:  Intraoperative images 0 755 hours today. FINDINGS: Portable AP supine view at 1217 hours. Bipolar left hip arthroplasty hardware redemonstrated. The left leg is somewhat rotated, but hardware and alignment appear satisfactory. Regional postoperative soft tissue changes. No new No acute osseous abnormality identified. Negative visible bowel gas pattern. IMPRESSION: Left bipolar hip arthroplasty with no adverse features identified. Electronically Signed   By: Genevie Ann M.D.   On: 07/17/2020 12:37   DG C-Arm 1-60 Min  Result Date: 07/17/2020 CLINICAL DATA:  Left hip hemiarthroplasty EXAM: OPERATIVE LEFT HIP  (WITH PELVIS IF PERFORMED) 2 VIEWS TECHNIQUE: Fluoroscopic spot image(s) were submitted for interpretation post-operatively. COMPARISON:  None. FINDINGS: Changes of left hip replacement. No hardware or bony complicating feature. Normal AP alignment. IMPRESSION: Left hip replacement.  No visible complicating feature. Electronically Signed   By: Rolm Baptise M.D.   On: 07/17/2020 10:19   DG HIP OPERATIVE UNILAT W OR W/O PELVIS LEFT  Result Date: 07/17/2020 CLINICAL DATA:  Left hip hemiarthroplasty EXAM: OPERATIVE LEFT HIP (WITH PELVIS IF  PERFORMED) 2 VIEWS TECHNIQUE: Fluoroscopic spot image(s) were submitted for interpretation post-operatively. COMPARISON:  None. FINDINGS: Changes of left hip replacement. No hardware or bony complicating feature. Normal AP alignment. IMPRESSION: Left hip replacement.  No visible complicating feature. Electronically Signed   By: Rolm Baptise M.D.   On: 07/17/2020 10:19        Scheduled Meds: . acetaminophen  1,000 mg Oral Q6H  . [START ON 07/18/2020] apixaban  2.5 mg Oral BID  . arformoterol  15 mcg Nebulization BID  . clonazePAM  0.5 mg Oral QHS  . docusate sodium  100 mg Oral BID  . donepezil  10 mg Oral QHS  . DULoxetine  60 mg Oral Daily  . feeding supplement  237 mL Oral BID BM  . furosemide  40 mg Oral QODAY  . gabapentin  300 mg Oral TID  . hydrocortisone sod succinate (SOLU-CORTEF) inj  25 mg Intravenous Q8H  . levothyroxine  125 mcg Oral QAC breakfast  . metoprolol tartrate  25 mg Oral Daily  . multivitamin with minerals  1 tablet Oral Daily  . pantoprazole  40 mg Oral BID  . pravastatin  40 mg Oral Daily  . predniSONE  10 mg Oral Q breakfast  . senna  1 tablet Oral BID  . umeclidinium bromide  1 puff Inhalation Daily   Continuous Infusions: . sodium chloride Stopped (07/17/20 0531)  . sodium chloride 75 mL/hr at 07/17/20 1410  .  ceFAZolin (ANCEF) IV    . methocarbamol (ROBAXIN) IV       LOS: 2 days    Time spent: over 30  min    Fayrene Helper, MD [facility]   To contact the attending provider between 7A-7P or the covering provider during after hours 7P-7A, please log into the web site [email or web address] and access using universal [place] password for that web site. If you do not have the password, please call the hospital operator.  07/17/2020, 5:02 PM

## 2020-07-17 NOTE — Plan of Care (Signed)

## 2020-07-17 NOTE — Op Note (Addendum)
HEMI HIP ARTHROPLASTY ANTERIOR APPROACH  Procedure Note BRINDA FOCHT   [id number]  Pre-op Diagnosis: Left Hip Fracture     Post-op Diagnosis: same   Operative Procedures  1. Prosthetic replacement for femoral neck fracture. CPT [phone]  Personnel  Surgeon(s): Leandrew Koyanagi, MD  ASSIST: Madalyn Rob, PA-C   Anesthesia: general, exparel local  Prosthesis: Depuy Femur: Actis 4 STD Head: 46 mm size: +1.5 Bearing Type: bipolar  Hip Hemiarthroplasty (Anterior Approach) Op Note:  After informed consent was obtained and the operative extremity marked in the holding area, the patient was brought back to the operating room and placed supine on the HANA table. Next, the operative extremity was prepped and draped in normal sterile fashion. Surgical timeout occurred verifying patient identification, surgical site, surgical procedure and administration of antibiotics.  A modified anterior Smith-Peterson approach to the hip was performed, using the interval between tensor fascia lata and sartorius.  Dissection was carried bluntly down onto the anterior hip capsule. The lateral femoral circumflex vessels were identified and coagulated. A capsulotomy was performed and fracture hematoma was evacuated and the capsular flaps tagged for later repair.  The neck osteotomy was performed below the fracture. The femoral head was removed and found a 46 mm head was the appropriate fit.    We then turned our attention to the femur.  After placing the femoral hook, the leg was taken to externally rotated, extended and adducted position taking care to perform soft tissue releases to allow for adequate mobilization of the femur. Soft tissue was cleared from the shoulder of the greater trochanter and the hook elevator used to improve exposure of the proximal femur. Sequential broaching performed up to a size 4. Trial neck and head were placed. The leg was brought back up to neutral and the construct reduced.  The position and sizing of components, offset and leg lengths were checked using fluoroscopy. Stability of the construct was checked in extension and external rotation without any subluxation or impingement of prosthesis. We dislocated the prosthesis, dropped the leg back into position, removed trial components, and irrigated copiously. The final stem and head was then placed, the leg brought back up, the system reduced and fluoroscopy used to verify positioning.  We irrigated, obtained hemostasis and closed the capsule using #2 ethibond suture.  The fascia was closed with #1 vicryl plus, the deep fat layer was closed with 0 vicryl, the subcutaneous layers closed with 2.0 Vicryl Plus and the skin closed with staples. A sterile dressing was applied. The patient was awakened in the operating room and taken to recovery in stable condition. All sponge, needle, and instrument counts were correct at the end of the case.   Tawanna Cooler, my PA, was necessary for opening, closing, exposing, retracting, limb positioning and overall facilitation and completion of the surgery.  Position: supine  Complications: see description of procedure.  Time Out: performed   Drains/Packing: none  Estimated blood loss: see anesthesia record  Returned to Recovery Room: in good condition.   Antibiotics: yes   Mechanical VTE (DVT) Prophylaxis: sequential compression devices, TED thigh-high  Chemical VTE (DVT) Prophylaxis: resume eliquis in the morning  Fluid Replacement: Crystalloid: see anesthesia record  Specimens Removed: 1 to pathology   Sponge and Instrument Count Correct? yes   PACU: portable radiograph - low AP   Admission: inpatient status, start PT & OT POD#1  Plan/RTC: Return in 2 weeks for staple removal. Return in 6 weeks to see MD.  Weight Bearing/Load Lower Extremity: full  Hip precautions: none  N. Eduard Roux, MD [facility] 9:03 AM

## 2020-07-18 DIAGNOSIS — S72002A Fracture of unspecified part of neck of left femur, initial encounter for closed fracture: Secondary | ICD-10-CM | POA: Diagnosis not present

## 2020-07-18 LAB — COMPREHENSIVE METABOLIC PANEL
ALT: 10 U/L (ref 0–44)
AST: 22 U/L (ref 15–41)
Albumin: 2.4 g/dL — ABNORMAL LOW (ref 3.5–5.0)
Alkaline Phosphatase: 72 U/L (ref 38–126)
Anion gap: 8 (ref 5–15)
BUN: 16 mg/dL (ref 8–23)
CO2: 25 mmol/L (ref 22–32)
Calcium: 7 mg/dL — ABNORMAL LOW (ref 8.9–10.3)
Chloride: 104 mmol/L (ref 98–111)
Creatinine, Ser: 1.01 mg/dL — ABNORMAL HIGH (ref 0.44–1.00)
GFR, Estimated: 55 mL/min — ABNORMAL LOW (ref >60)
Glucose, Bld: 119 mg/dL — ABNORMAL HIGH (ref 70–99)
Potassium: 4.7 mmol/L (ref 3.5–5.1)
Sodium: 137 mmol/L (ref 135–145)
Total Bilirubin: 0.8 mg/dL (ref 0.3–1.2)
Total Protein: 5.1 g/dL — ABNORMAL LOW (ref 6.5–8.1)

## 2020-07-18 LAB — CBC
HCT: 24.5 % — ABNORMAL LOW (ref 36.0–46.0)
Hemoglobin: 7.6 g/dL — ABNORMAL LOW (ref 12.0–15.0)
MCH: 27 pg (ref 26.0–34.0)
MCHC: 31 g/dL (ref 30.0–36.0)
MCV: 86.9 fL (ref 80.0–100.0)
Platelets: 137 10*3/uL — ABNORMAL LOW (ref 150–400)
RBC: 2.82 MIL/uL — ABNORMAL LOW (ref 3.87–5.11)
RDW: 16.8 % — ABNORMAL HIGH (ref 11.5–15.5)
WBC: 10.6 10*3/uL — ABNORMAL HIGH (ref 4.0–10.5)
nRBC: 0 % (ref 0.0–0.2)

## 2020-07-18 LAB — HEMOGLOBIN AND HEMATOCRIT, BLOOD
HCT: 27.9 % — ABNORMAL LOW (ref 36.0–46.0)
Hemoglobin: 8.8 g/dL — ABNORMAL LOW (ref 12.0–15.0)

## 2020-07-18 LAB — PREPARE RBC (CROSSMATCH)

## 2020-07-18 LAB — PHOSPHORUS: Phosphorus: 2.6 mg/dL (ref 2.5–4.6)

## 2020-07-18 MED ORDER — SODIUM CHLORIDE 0.9% IV SOLUTION: Freq: Once | INTRAVENOUS | Status: AC

## 2020-07-18 NOTE — Progress Notes (Signed)
Patient is stable and feels sore this morning.   Did 1 session of PT yesterday - likely will need SNF. Surgical dressing is c/d/i. Thigh has moderate ecchymosis which is to be expected in the setting of recent surgery and being on eliquis. WBAT LLE.  No hip precautions. Mobilize with PT/OT. ABLA - will transfuse one unit today.  She did feel a little light headed when she got up to use bathroom.  Azucena Cecil, MD [facility] [phone] 11:46 AM

## 2020-07-18 NOTE — Plan of Care (Signed)
  Problem: Nutrition: Goal: Adequate nutrition will be maintained Outcome: Progressing   Problem: Pain Managment: Goal: General experience of comfort will improve Outcome: Progressing   Problem: Safety: Goal: Ability to remain free from injury will improve Outcome: Progressing   

## 2020-07-18 NOTE — Progress Notes (Signed)
OT Cancellation Note  Patient Details Name: Brandi Raymond MRN: [id number] DOB: Dec 28, 1933   Cancelled Treatment:    Reason Eval/Treat Not Completed: Patient declined, no reason specified (at 1250- pt had just started eating lunch; at 1:30p, OTR returned and pt stating "I just got back to bed not too long ago and my pain is finally controlled- I would love to nap now. OT to continue to follow for OT eval.)   Jefferey Pica, OTR/L Acute Rehabilitation Services Pager: [phone] Office: [phone]   Rider Ermis C 07/18/2020, 2:09 PM

## 2020-07-18 NOTE — Progress Notes (Signed)
PROGRESS NOTE    Brandi Raymond  GQQ:[id number] DOB: April 15, 1934 DOA: 07/15/2020 PCP: Binnie Rail, MD   Chief Complaint  Patient presents with  . Fall    Brief Narrative:  Brandi Raymond is Brandi Raymond 87 y.o. female with medical history significant of Jaron Czarnecki fib, COPD, SCC right lung s/p XRT, GERD, Dementia, HTN, CAD, memory loss. She has multiple vertebral compressiosn fractrures and was scheduled for kyphoplasty. She was waiting for her grad-daughter to take to the doctor. She stood up, lost her balance and fell with subsequent pain and inability to bear weight. She comes to [facility] for evaluation.  ED Course: T 98.1  141/111  HR 81  RR 18, on O2. Cmet nl, Hgb 9.7 (chronic anemia). CXR NAD, emphysema, hiatal hernia. Xray left hip with impacted intertrochanteric fx. Pt seen by ortho and is for surgery 07/17/20. TRH called to admit, clear for surgery and manage medical issues.   Assessment & Plan:   Principal Problem:   Displaced fracture of left femoral neck (HCC) Active Problems:   Hypothyroidism   HYPERLIPIDEMIA   Essential hypertension   COPD (chronic obstructive pulmonary disease) with emphysema (HCC)   Anemia   Chronic mesenteric ischemia (HCC)   GERD (gastroesophageal reflux disease)   Paroxysmal atrial fibrillation (HCC)   Closed left hip fracture ([place])  1. Left femoral neck fracture - after mechanical fall - s/p prosthetic replacement for femoral neck fracture 07/17/2020 - eliquis for dvt ppx - scheduled APAP, oxy, morphine prn - bowel regimen  # Post Operative Anemia: down to 7.6 today, will transfuse 1 unit pRBC as seems like she's symptomatic.  Discussed risks/benefits.  2. COPD - oxygen dependent on 3 L at home. Currently stable Continue incruse ellipta, prn atrovent, brovana On chronic prednisone, continue   3. Compression fx's spine - active problem. Was for kyphoplasty - now delayed to #1 Compression fx of T12, L1, L3 on imaging Discussed with neurosurgery ->  conservative measures for compression fx - he's going to follow up after hip repair when she's ambulatory  4. Atrial Fibrillation with RVR - currently in the 130's-150's - fluctuating pretty widely Follow with home PO metop Metop IV prn for sustained HR > 120 Anticoagulation on hold post op - will resume when ok per ortho   # Acute Kidney Injury: baseline creatinine is around 1.  Creatinine up today.  Hold lasix.  Will give gentle fluids overnight.  5. Anemia - chronic problem - stable - follow post op   6. Hypothyroidism- continue home meds.  Follow TSH (wnl)  7. Memory loss - continue home meds.   8. Oncology - follows with Dr. Earlie Server for [place] right lung s/p xrt question of recurrence per grand-daughter. Currently stable.  # Hypotension: resolved,  DVT prophylaxis: heparin  Code Status: dnr Family Communication: husband at bedside Disposition:   Status is: Inpatient  Remains inpatient appropriate because:Inpatient level of care appropriate due to severity of illness   Dispo: The patient is from: Home              Anticipated d/c is to: pending              Anticipated d/c date is: > 3 days              Patient currently is not medically stable to d/c.  Consultants:   Ortho  neurosurgery  Procedures:   none  Antimicrobials: Anti-infectives (From admission, onward)   Start     Dose/Rate  Route Frequency Ordered Stop   07/17/20 2000  ceFAZolin (ANCEF) IVPB 2g/100 mL premix        2 g 200 mL/hr over 30 Minutes Intravenous Every 12 hours 07/17/20 1233 07/19/20 0759   07/17/20 0949  vancomycin (VANCOCIN) powder  Status:  Discontinued          As needed 07/17/20 0949 07/17/20 0949   07/16/20 0945  ceFAZolin (ANCEF) IVPB 2g/100 mL premix       Note to Pharmacy: Anesthesia to give preop   2 g 200 mL/hr over 30 Minutes Intravenous  Once 07/16/20 0941 07/16/20 1107        Subjective: No new complaints today, mild LH   Objective: Vitals:   07/18/20 0856  07/18/20 1324 07/18/20 1341 07/18/20 1609  BP: (!) 102/45 106/61 (!) 132/59 122/72  Pulse: 72 75 80 68  Resp: 18 17 16 15   Temp: 98.4 F (36.9 C) 98.1 F (36.7 C) 98.1 F (36.7 C) 97.9 F (36.6 C)  TempSrc: Oral Oral Oral Oral  SpO2: (!) 89% 92% 100% 96%  Weight:      Height:        Intake/Output Summary (Last 24 hours) at 07/18/2020 1627 Last data filed at 07/18/2020 1600 Gross per 24 hour  Intake 2193.42 ml  Output --  Net 2193.42 ml   Filed Weights   07/15/20 1129  Weight: 61.2 kg    Examination:  General: No acute distress. Cardiovascular: Heart sounds show Evertt Chouinard regular rate, and rhythm. Lungs: Clear to auscultation bilaterally  Abdomen: Soft, nontender, nondistended Neurological: Alert and oriented 3. Moves all extremities 4 with equal strength. Cranial nerves II through XII grossly intact. Skin: Warm and dry. No rashes or lesions. Extremities: LLE with intact dressing    Data Reviewed: I have personally reviewed following labs and imaging studies  CBC: Recent Labs  Lab 07/15/20 1237 07/16/20 0241 07/17/20 0245 07/18/20 0151  WBC 8.5 9.6 9.7 10.6*  NEUTROABS 7.1  --   --   --   HGB 9.7* 9.8* 9.4* 7.6*  HCT 34.8* 34.3* 31.0* 24.5*  MCV 90.6 89.6 87.8 86.9  PLT 191 161 138* 137*    Basic Metabolic Panel: Recent Labs  Lab 07/15/20 1237 07/16/20 1852 07/17/20 2048 07/18/20 0151  NA 138 135  --  137  K 4.7 4.2  --  4.7  CL 101 98  --  104  CO2 27 23  --  25  GLUCOSE 102* 128*  --  119*  BUN 20 23  --  16  CREATININE 1.05* 1.34*  --  1.01*  CALCIUM 8.3* 7.2*  --  7.0*  PHOS  --   --  2.9 2.6    GFR: Estimated Creatinine Clearance: 35.8 mL/min (Ailee Pates) (by C-G formula based on SCr of 1.01 mg/dL (H)).  Liver Function Tests: Recent Labs  Lab 07/15/20 1237 07/16/20 1852 07/18/20 0151  AST 26 35 22  ALT 21 18 10   ALKPHOS 112 103 72  BILITOT 0.4 0.7 0.8  PROT 6.1* 5.9* 5.1*  ALBUMIN 3.2* 2.9* 2.4*    CBG: No results for input(s): GLUCAP in  the last 168 hours.   Recent Results (from the past 240 hour(s))  Resp Panel by RT-PCR (Flu Erickson Yamashiro&B, Covid) Nasopharyngeal Swab     Status: None   Collection Time: 07/15/20 12:45 PM   Specimen: Nasopharyngeal Swab; Nasopharyngeal(NP) swabs in vial transport medium  Result Value Ref Range Status   SARS Coronavirus 2 by RT PCR NEGATIVE NEGATIVE  Final    Comment: (NOTE) SARS-CoV-2 target nucleic acids are NOT DETECTED.  The SARS-CoV-2 RNA is generally detectable in upper respiratory specimens during the acute phase of infection. The lowest concentration of SARS-CoV-2 viral copies this assay can detect is 138 copies/mL. Gregary Blackard negative result does not preclude SARS-Cov-2 infection and should not be used as the sole basis for treatment or other patient management decisions. Camile Esters negative result may occur with  improper specimen collection/handling, submission of specimen other than nasopharyngeal swab, presence of viral mutation(s) within the areas targeted by this assay, and inadequate number of viral copies(<138 copies/mL). Raelynn Corron negative result must be combined with clinical observations, patient history, and epidemiological information. The expected result is Negative.  Fact Sheet for Patients:  EntrepreneurPulse.com.au  Fact Sheet for Healthcare Providers:  IncredibleEmployment.be  This test is no t yet approved or cleared by the [place] FDA and  has been authorized for detection and/or diagnosis of SARS-CoV-2 by FDA under an Emergency Use Authorization (EUA). This EUA will remain  in effect (meaning this test can be used) for the duration of the COVID-19 declaration under Section 564(b)(1) of the Act, 21 U.S.C.section 360bbb-3(b)(1), unless the authorization is terminated  or revoked sooner.       Influenza Priscila Bean by PCR NEGATIVE NEGATIVE Final   Influenza B by PCR NEGATIVE NEGATIVE Final    Comment: (NOTE) The Xpert Xpress SARS-CoV-2/FLU/RSV plus  assay is intended as an aid in the diagnosis of influenza from Nasopharyngeal swab specimens and should not be used as Dotsie Gillette sole basis for treatment. Nasal washings and aspirates are unacceptable for Xpert Xpress SARS-CoV-2/FLU/RSV testing.  Fact Sheet for Patients: EntrepreneurPulse.com.au  Fact Sheet for Healthcare Providers: IncredibleEmployment.be  This test is not yet approved or cleared by the [place] FDA and has been authorized for detection and/or diagnosis of SARS-CoV-2 by FDA under an Emergency Use Authorization (EUA). This EUA will remain in effect (meaning this test can be used) for the duration of the COVID-19 declaration under Section 564(b)(1) of the Act, 21 U.S.C. section 360bbb-3(b)(1), unless the authorization is terminated or revoked.  Performed at [facility], [place] [address], [place], [place] [address]   Surgical pcr screen     Status: None   Collection Time: 07/16/20  4:29 PM   Specimen: Nasal Mucosa; Nasal Swab  Result Value Ref Range Status   MRSA, PCR NEGATIVE NEGATIVE Final   Staphylococcus aureus NEGATIVE NEGATIVE Final    Comment: (NOTE) The Xpert SA Assay (FDA approved for NASAL specimens in patients 18 years of age and older), is one component of Brandi Tomlinson comprehensive surveillance program. It is not intended to diagnose infection nor to guide or monitor treatment. Performed at [facility], [place] [address], [place], [place] [address]          Radiology Studies: DG Pelvis Portable  Result Date: 07/17/2020 CLINICAL DATA:  87 year old female status post hip surgery for femoral neck fracture. EXAM: PORTABLE PELVIS 1-2 VIEWS COMPARISON:  Intraoperative images 0 755 hours today. FINDINGS: Portable AP supine view at 1217 hours. Bipolar left hip arthroplasty hardware redemonstrated. The left leg is somewhat rotated, but hardware and alignment appear satisfactory. Regional postoperative soft tissue changes.  No new No acute osseous abnormality identified. Negative visible bowel gas pattern. IMPRESSION: Left bipolar hip arthroplasty with no adverse features identified. Electronically Signed   By: Genevie Ann M.D.   On: 07/17/2020 12:37   DG C-Arm 1-60 Min  Result Date: 07/17/2020 CLINICAL DATA:  Left hip hemiarthroplasty EXAM: OPERATIVE LEFT HIP (WITH PELVIS IF PERFORMED) 2 VIEWS TECHNIQUE: Fluoroscopic spot image(s) were submitted for interpretation post-operatively. COMPARISON:  None. FINDINGS: Changes of left hip replacement. No hardware or bony complicating feature. Normal AP alignment. IMPRESSION: Left hip replacement.  No visible complicating feature. Electronically Signed   By: Rolm Baptise M.D.   On: 07/17/2020 10:19   DG HIP OPERATIVE UNILAT W OR W/O PELVIS LEFT  Result Date: 07/17/2020 CLINICAL DATA:  Left hip hemiarthroplasty EXAM: OPERATIVE LEFT HIP (WITH PELVIS IF PERFORMED) 2 VIEWS TECHNIQUE: Fluoroscopic spot image(s) were submitted for interpretation post-operatively. COMPARISON:  None. FINDINGS: Changes of left hip replacement. No hardware or bony complicating feature. Normal AP alignment. IMPRESSION: Left hip replacement.  No visible complicating feature. Electronically Signed   By: Rolm Baptise M.D.   On: 07/17/2020 10:19        Scheduled Meds: . apixaban  2.5 mg Oral BID  . arformoterol  15 mcg Nebulization BID  . clonazePAM  0.5 mg Oral QHS  . docusate sodium  100 mg Oral BID  . donepezil  10 mg Oral QHS  . DULoxetine  60 mg Oral Daily  . feeding supplement  237 mL Oral BID BM  . gabapentin  300 mg Oral TID  . levothyroxine  125 mcg Oral QAC breakfast  . metoprolol tartrate  25 mg Oral Daily  . multivitamin with minerals  1 tablet Oral Daily  . pantoprazole  40 mg Oral BID  . pravastatin  40 mg Oral Daily  . predniSONE  10 mg Oral Q breakfast  . senna  1 tablet Oral BID  . umeclidinium bromide  1 puff Inhalation Daily   Continuous Infusions: . sodium chloride Stopped  (07/17/20 0531)  .  ceFAZolin (ANCEF) IV 2 g (07/18/20 0931)  . lactated ringers 75 mL/hr at 07/17/20 1826  . methocarbamol (ROBAXIN) IV       LOS: 3 days    Time spent: over 30 min    Fayrene Helper, MD [facility]   To contact the attending provider between 7A-7P or the covering provider during after hours 7P-7A, please log into the web site [email or web address] and access using universal [place] password for that web site. If you do not have the password, please call the hospital operator.  07/18/2020, 4:27 PM

## 2020-07-19 DIAGNOSIS — S72002A Fracture of unspecified part of neck of left femur, initial encounter for closed fracture: Secondary | ICD-10-CM | POA: Diagnosis not present

## 2020-07-19 LAB — BPAM RBC
Blood Product Expiration Date: 202201202359
ISSUE DATE / TIME: 202201021316
Unit Type and Rh: 600

## 2020-07-19 LAB — CBC
HCT: 27.7 % — ABNORMAL LOW (ref 36.0–46.0)
Hemoglobin: 8.4 g/dL — ABNORMAL LOW (ref 12.0–15.0)
MCH: 26.5 pg (ref 26.0–34.0)
MCHC: 30.3 g/dL (ref 30.0–36.0)
MCV: 87.4 fL (ref 80.0–100.0)
Platelets: 138 10*3/uL — ABNORMAL LOW (ref 150–400)
RBC: 3.17 MIL/uL — ABNORMAL LOW (ref 3.87–5.11)
RDW: 16.7 % — ABNORMAL HIGH (ref 11.5–15.5)
WBC: 11.2 10*3/uL — ABNORMAL HIGH (ref 4.0–10.5)
nRBC: 0.4 % — ABNORMAL HIGH (ref 0.0–0.2)

## 2020-07-19 LAB — TYPE AND SCREEN
ABO/RH(D): A NEG
Antibody Screen: NEGATIVE
Unit division: 0

## 2020-07-19 LAB — BASIC METABOLIC PANEL
Anion gap: 11 (ref 5–15)
BUN: 19 mg/dL (ref 8–23)
CO2: 23 mmol/L (ref 22–32)
Calcium: 7.2 mg/dL — ABNORMAL LOW (ref 8.9–10.3)
Chloride: 103 mmol/L (ref 98–111)
Creatinine, Ser: 0.98 mg/dL (ref 0.44–1.00)
GFR, Estimated: 57 mL/min — ABNORMAL LOW (ref >60)
Glucose, Bld: 123 mg/dL — ABNORMAL HIGH (ref 70–99)
Potassium: 4 mmol/L (ref 3.5–5.1)
Sodium: 137 mmol/L (ref 135–145)

## 2020-07-19 NOTE — Progress Notes (Signed)
PROGRESS NOTE    Brandi Raymond  MPN:[id number] DOB: 1933-08-06 DOA: 07/15/2020 PCP: Binnie Rail, MD   Chief Complaint  Patient presents with  . Fall    Brief Narrative:  Brandi Raymond is Brandi Raymond 87 y.o. female with medical history significant of Brandi Raymond, COPD, SCC right lung s/p XRT, GERD, Dementia, HTN, CAD, memory loss. She has multiple vertebral compressiosn fractrures and was scheduled for kyphoplasty. She was waiting for her grad-daughter to take to the doctor. She stood up, lost her balance and fell with subsequent pain and inability to bear weight. She comes to [facility] for evaluation.  ED Course: T 98.1  141/111  HR 81  RR 18, on O2. Cmet nl, Hgb 9.7 (chronic anemia). CXR NAD, emphysema, hiatal hernia. Xray left hip with impacted intertrochanteric fx. Pt seen by ortho and is for surgery 07/17/20. TRH called to admit, clear for surgery and manage medical issues.   Assessment & Plan:   Principal Problem:   Displaced fracture of left femoral neck (HCC) Active Problems:   Hypothyroidism   HYPERLIPIDEMIA   Essential hypertension   COPD (chronic obstructive pulmonary disease) with emphysema (HCC)   Anemia   Chronic mesenteric ischemia (HCC)   GERD (gastroesophageal reflux disease)   Paroxysmal atrial fibrillation (HCC)   Closed left hip fracture ([place])  1. Left femoral neck fracture - after mechanical fall - s/p prosthetic replacement for femoral neck fracture 07/17/2020 - eliquis for dvt ppx - scheduled APAP, oxy, morphine prn - bowel regimen - WBAT LLE, follow up with Dr. Erlinda Hong 2 weeks post op  # Post Operative Anemia: s/p 1 unit pRBC Stable, follow  Follow anemia panel   2. COPD - oxygen dependent on 3 L at home. Currently stable Continue incruse ellipta, prn atrovent, brovana On chronic prednisone, continue   3. Compression fx's spine - active problem. Was for kyphoplasty - now delayed to #1 Compression fx of T12, L1, L3 on imaging Discussed with neurosurgery ->  conservative measures for compression fx - brace for [place] OOB if needed, follow up outpatient in 1 month  4. Atrial Fibrillation with RVR - currently in the 130's-150's - fluctuating pretty widely Follow with home PO metop Metop IV prn for sustained HR > 120 Anticoagulation on hold post op - will resume when ok per ortho   # Acute Kidney Injury: baseline creatinine is around 1.  Creatinine up today.  Hold lasix.   5. Anemia - chronic problem - stable - follow post op   6. Hypothyroidism- continue home meds.  Follow TSH (wnl)  7. Memory loss - continue home meds.   8. Oncology - follows with Dr. Earlie Raymond for [place] right lung s/p xrt question of recurrence per grand-daughter. Currently stable.  # Hypotension: resolved,  DVT prophylaxis: heparin  Code Status: dnr Family Communication: none at bedside Disposition:   Status is: Inpatient  Remains inpatient appropriate because:Inpatient level of care appropriate due to severity of illness   Dispo: The patient is from: Home              Anticipated d/c is to: pending              Anticipated d/c date is: > 3 days              Patient currently is not medically stable to d/c.  Consultants:   Ortho  neurosurgery  Procedures:   none  Antimicrobials: Anti-infectives (From admission, onward)   Start  Dose/Rate Route Frequency Ordered Stop   07/17/20 2000  ceFAZolin (ANCEF) IVPB 2g/100 mL premix        2 g 200 mL/hr over 30 Minutes Intravenous Every 12 hours 07/17/20 1233 07/18/20 2341   07/17/20 0949  vancomycin (VANCOCIN) powder  Status:  Discontinued          As needed 07/17/20 0949 07/17/20 0949   07/16/20 0945  ceFAZolin (ANCEF) IVPB 2g/100 mL premix       Note to Pharmacy: Anesthesia to give preop   2 g 200 mL/hr over 30 Minutes Intravenous  Once 07/16/20 0941 07/16/20 1107        Subjective: No new complaints Eager to progress with therapy towards discharge  Objective: Vitals:   07/18/20 2155  07/19/20 0513 07/19/20 0740 07/19/20 1506  BP: (!) 119/53 128/89 (!) 152/85 118/60  Pulse: 89 90 90 71  Resp: 18 18 17 16   Temp: 97.9 F (36.6 C) 97.9 F (36.6 C) 97.7 F (36.5 C) (!) 97.4 F (36.3 C)  TempSrc: Oral Oral Oral Oral  SpO2: (!) 84% 94% 99% 100%  Weight:      Height:        Intake/Output Summary (Last 24 hours) at 07/19/2020 1722 Last data filed at 07/19/2020 1300 Gross per 24 hour  Intake 480 ml  Output --  Net 480 ml   Filed Weights   07/15/20 1129  Weight: 61.2 kg    Examination:  General: No acute distress. Cardiovascular: Heart sounds show Brandi Raymond regular rate, and rhythm Lungs: Clear to auscultation bilaterally  Abdomen: Soft, nontender, nondistended Neurological: Alert and oriented 3. Moves all extremities 4. Cranial nerves II through XII grossly intact. Skin: Warm and dry. No rashes or lesions. Extremities: LLE with intact dressing    Data Reviewed: I have personally reviewed following labs and imaging studies  CBC: Recent Labs  Lab 07/15/20 1237 07/16/20 0241 07/17/20 0245 07/18/20 0151 07/18/20 1756 07/19/20 0453  WBC 8.5 9.6 9.7 10.6*  --  11.2*  NEUTROABS 7.1  --   --   --   --   --   HGB 9.7* 9.8* 9.4* 7.6* 8.8* 8.4*  HCT 34.8* 34.3* 31.0* 24.5* 27.9* 27.7*  MCV 90.6 89.6 87.8 86.9  --  87.4  PLT 191 161 138* 137*  --  138*    Basic Metabolic Panel: Recent Labs  Lab 07/15/20 1237 07/16/20 1852 07/17/20 2048 07/18/20 0151 07/19/20 0453  NA 138 135  --  137 137  K 4.7 4.2  --  4.7 4.0  CL 101 98  --  104 103  CO2 27 23  --  25 23  GLUCOSE 102* 128*  --  119* 123*  BUN 20 23  --  16 19  CREATININE 1.05* 1.34*  --  1.01* 0.98  CALCIUM 8.3* 7.2*  --  7.0* 7.2*  PHOS  --   --  2.9 2.6  --     GFR: Estimated Creatinine Clearance: 36.9 mL/min (by C-G formula based on SCr of 0.98 mg/dL).  Liver Function Tests: Recent Labs  Lab 07/15/20 1237 07/16/20 1852 07/18/20 0151  AST 26 35 22  ALT 21 18 10   ALKPHOS 112 103 72   BILITOT 0.4 0.7 0.8  PROT 6.1* 5.9* 5.1*  ALBUMIN 3.2* 2.9* 2.4*    CBG: No results for input(s): GLUCAP in the last 168 hours.   Recent Results (from the past 240 hour(s))  Resp Panel by RT-PCR (Flu Angelica Wix&B, Covid) Nasopharyngeal Swab  Status: None   Collection Time: 07/15/20 12:45 PM   Specimen: Nasopharyngeal Swab; Nasopharyngeal(NP) swabs in vial transport medium  Result Value Ref Range Status   SARS Coronavirus 2 by RT PCR NEGATIVE NEGATIVE Final    Comment: (NOTE) SARS-CoV-2 target nucleic acids are NOT DETECTED.  The SARS-CoV-2 RNA is generally detectable in upper respiratory specimens during the acute phase of infection. The lowest concentration of SARS-CoV-2 viral copies this assay can detect is 138 copies/mL. Brandi Raymond negative result does not preclude SARS-Cov-2 infection and should not be used as the sole basis for treatment or other patient management decisions. Brandi Raymond negative result may occur with  improper specimen collection/handling, submission of specimen other than nasopharyngeal swab, presence of viral mutation(s) within the areas targeted by this assay, and inadequate number of viral copies(<138 copies/mL). Brandi Raymond negative result must be combined with clinical observations, patient history, and epidemiological information. The expected result is Negative.  Fact Sheet for Patients:  Brandi Raymond  Fact Sheet for Healthcare Providers:  IncredibleEmployment.be  This test is no t yet approved or cleared by the [place] FDA and  has been authorized for detection and/or diagnosis of SARS-CoV-2 by FDA under an Emergency Use Authorization (EUA). This EUA will remain  in effect (meaning this test can be used) for the duration of the COVID-19 declaration under Section 564(b)(1) of the Act, 21 U.S.C.section 360bbb-3(b)(1), unless the authorization is terminated  or revoked sooner.       Influenza Brandi Raymond by PCR NEGATIVE NEGATIVE  Final   Influenza B by PCR NEGATIVE NEGATIVE Final    Comment: (NOTE) The Xpert Xpress SARS-CoV-2/FLU/RSV plus assay is intended as an aid in the diagnosis of influenza from Nasopharyngeal swab specimens and should not be used as Brandi Raymond sole basis for treatment. Nasal washings and aspirates are unacceptable for Xpert Xpress SARS-CoV-2/FLU/RSV testing.  Fact Sheet for Patients: Brandi Raymond  Fact Sheet for Healthcare Providers: IncredibleEmployment.be  This test is not yet approved or cleared by the [place] FDA and has been authorized for detection and/or diagnosis of SARS-CoV-2 by FDA under an Emergency Use Authorization (EUA). This EUA will remain in effect (meaning this test can be used) for the duration of the COVID-19 declaration under Section 564(b)(1) of the Act, 21 U.S.C. section 360bbb-3(b)(1), unless the authorization is terminated or revoked.  Performed at [facility], [place] [address], [place], [place] [address]   Surgical pcr screen     Status: None   Collection Time: 07/16/20  4:29 PM   Specimen: Nasal Mucosa; Nasal Swab  Result Value Ref Range Status   MRSA, PCR NEGATIVE NEGATIVE Final   Staphylococcus aureus NEGATIVE NEGATIVE Final    Comment: (NOTE) The Xpert SA Assay (FDA approved for NASAL specimens in patients 58 years of age and older), is one component of Brandi Raymond comprehensive surveillance program. It is not intended to diagnose infection nor to guide or monitor treatment. Performed at [facility], [place] [address], [place], [place] [address]          Radiology Studies: No results found.      Scheduled Meds: . apixaban  2.5 mg Oral BID  . arformoterol  15 mcg Nebulization BID  . clonazePAM  0.5 mg Oral QHS  . docusate sodium  100 mg Oral BID  . donepezil  10 mg Oral QHS  . DULoxetine  60 mg Oral Daily  . feeding supplement  237 mL Oral BID BM  . gabapentin  300 mg Oral TID  .  levothyroxine  125 mcg Oral QAC breakfast  . metoprolol tartrate  25 mg Oral Daily  . multivitamin with minerals  1 tablet Oral Daily  . pantoprazole  40 mg Oral BID  . pravastatin  40 mg Oral Daily  . predniSONE  10 mg Oral Q breakfast  . senna  1 tablet Oral BID  . umeclidinium bromide  1 puff Inhalation Daily   Continuous Infusions: . sodium chloride Stopped (07/17/20 0531)  . methocarbamol (ROBAXIN) IV       LOS: 4 days    Time spent: over 30 min    Fayrene Helper, MD [facility]   To contact the attending provider between 7A-7P or the covering provider during after hours 7P-7A, please log into the web site [email or web address] and access using universal [place] password for that web site. If you do not have the password, please call the hospital operator.  07/19/2020, 5:22 PM

## 2020-07-19 NOTE — Plan of Care (Signed)

## 2020-07-19 NOTE — Progress Notes (Signed)
Physical Therapy Treatment Patient Details Name: Brandi Raymond MRN: [id number] DOB: 03-May-1934 Today's Date: 07/19/2020    History of Present Illness The pt is an 87 yo female presenting s/p hemi arthroplasty of L hip, anterior approach, to repair L hip fx sustained in a fall at home. PMH includes: arthritis, CAD, COPD on 3L O2 at baseline, GERD, HTN, HLD, and memory loss.    PT Comments    Pt progressing slowly towards physical therapy goals.  Required gross min-mod assist for mobility with RW, however 1 LOB requiring max assist to recover and prevent fall. Pt very fatigued at end of session and therapeutic exercise deferred. Will continue to follow and progress as able per POC.    Follow Up Recommendations  SNF;Supervision/Assistance - 24 hour     Equipment Recommendations  None recommended by PT    Recommendations for Other Services       Precautions / Restrictions Precautions Precautions: Anterior Hip;Fall Precaution Comments: discussed verbally Restrictions Weight Bearing Restrictions: Yes LLE Weight Bearing: Weight bearing as tolerated    Mobility  Bed Mobility Overal bed mobility: Needs Assistance Bed Mobility: Supine to Sit     Supine to sit: Mod assist     General bed mobility comments: Assist for LE movement to EOB and trunk elevation to full sitting position. MUltimodal cues for hand placement and use of rail. Bed pad utilized to complete scooting to EOB.  Transfers Overall transfer level: Needs assistance Equipment used: Rolling walker (2 wheeled) Transfers: Sit to/from Stand Sit to Stand: Min assist         General transfer comment: Assist to power up to full stand from EOB as well as from [facility]. VC's for hand placement on seated surface for safety as well as for LE's within walker before initiating ambulation.  Ambulation/Gait Ambulation/Gait assistance: Min assist;Mod assist;Max assist Gait Distance (Feet): 25 Feet Assistive device: Rolling  walker (2 wheeled) Gait Pattern/deviations: Step-to pattern;Decreased step length - left;Leaning posteriorly;Trunk flexed;Decreased weight shift to left Gait velocity: decreased Gait velocity interpretation: <1.31 ft/sec, indicative of household ambulator General Gait Details: Min-mod assist required for balance support and walker management. Pt with heavy anterior push of walker with flexed trunk, requiring therapist to prevent RW from moving until pt walked up inside of it. When exiting the bathroom, 1 LOB posteriorly and to the L in which max assist was required to recover.   Stairs             Wheelchair Mobility    Modified Rankin (Stroke Patients Only)       Balance Overall balance assessment: Needs assistance Sitting-balance support: No upper extremity supported;Feet supported Sitting balance-Leahy Scale: Fair   Postural control: Posterior lean Standing balance support: Bilateral upper extremity supported Standing balance-Leahy Scale: Poor Standing balance comment: reliant on UE support                            Cognition Arousal/Alertness: Awake/alert Behavior During Therapy: WFL for tasks assessed/performed Overall Cognitive Status: Within Functional Limits for tasks assessed                                        Exercises      General Comments        Pertinent Vitals/Pain Pain Assessment: Faces Faces Pain Scale: Hurts little more Pain Location: L hip Pain Descriptors /  Indicators: Discomfort;Grimacing;Sore Pain Intervention(s): Limited activity within patient's tolerance;Repositioned;Monitored during session    Home Living                      Prior Function            PT Goals (current goals can now be found in the care plan section) Acute Rehab PT Goals Patient Stated Goal: walk with rollator PT Goal Formulation: With patient Time For Goal Achievement: 07/31/20 Potential to Achieve Goals: Good Progress  towards PT goals: Progressing toward goals    Frequency    Min 3X/week      PT Plan Current plan remains appropriate    Co-evaluation              AM-PAC PT "6 Clicks" Mobility   Outcome Measure  Help needed turning from your back to your side while in a flat bed without using bedrails?: A Little Help needed moving from lying on your back to sitting on the side of a flat bed without using bedrails?: A Little Help needed moving to and from a bed to a chair (including a wheelchair)?: A Little Help needed standing up from a chair using your arms (e.g., wheelchair or bedside chair)?: A Little Help needed to walk in hospital room?: A Lot Help needed climbing 3-5 steps with a railing? : Total 6 Click Score: 15    End of Session Equipment Utilized During Treatment: Gait belt;Oxygen Activity Tolerance: Patient tolerated treatment well;Patient limited by fatigue;Patient limited by pain Patient left: in bed;with call bell/phone within reach;with family/visitor present Nurse Communication: Mobility status PT Visit Diagnosis: Unsteadiness on feet (R26.81);Other abnormalities of gait and mobility (R26.89);Muscle weakness (generalized) (M62.81);Pain Pain - Right/Left: Left Pain - part of body: Hip     Time: 0902-0939 PT Time Calculation (min) (ACUTE ONLY): 37 min  Charges:  $Gait Training: 23-37 mins                     Rolinda Roan, PT, DPT Acute Rehabilitation Services Pager: [phone] Office: [phone]    Thelma Comp 07/19/2020, 10:18 AM

## 2020-07-19 NOTE — Progress Notes (Signed)
   Providing Compassionate, Quality Care - Together  NEUROSURGERY PROGRESS NOTE   S: No issues overnight. Has some mild back pain when OOB  O: EXAM:  BP (!) 152/85 (BP Location: Right Arm)   Pulse 90   Temp 97.7 F (36.5 C) (Oral)   Resp 17   Ht 5\' 4"  (1.626 m)   Wt 61.2 kg   SpO2 99%   BMI 23.17 kg/m   Awake, alert, oriented  Speech fluent, appropriate  CNs grossly intact  MAE well SILT TTP lower T spine/upper L spine  ASSESSMENT:  87 y.o. female with  1. T12 Compression fx  PLAN: - offered inpatient MRI to patient, however she would like to wait -rec brace only for comfort OOB if needed (appears to tolerate pain without it) -can f/u in office in 1 month -will sign off at this time    Thank you for allowing me to participate in this patient's care.  Please do not hesitate to call with questions or concerns.   Elwin Sleight, [facility] Cell: [phone]

## 2020-07-19 NOTE — Progress Notes (Signed)
Subjective: 2 Days Post-Op Procedure(s) (LRB): HEMI HIP ARTHROPLASTY ANTERIOR APPROACH (Left) Patient reports pain as mild.    Objective: Vital signs in last 24 hours: Temp:  [97.7 F (36.5 C)-98.4 F (36.9 C)] 97.7 F (36.5 C) (01/03 0740) Pulse Rate:  [68-90] 90 (01/03 0740) Resp:  [15-18] 17 (01/03 0740) BP: (102-152)/(45-89) 152/85 (01/03 0740) SpO2:  [84 %-100 %] 99 % (01/03 0740)  Intake/Output from previous day: 01/02 0701 - 01/03 0700 In: 1342.3 [I.V.:649.3; Blood:593; IV Piggyback:100] Out: -  Intake/Output this shift: No intake/output data recorded.  Recent Labs    07/17/20 0245 07/18/20 0151 07/18/20 1756 07/19/20 0453  HGB 9.4* 7.6* 8.8* 8.4*   Recent Labs    07/18/20 0151 07/18/20 1756 07/19/20 0453  WBC 10.6*  --  11.2*  RBC 2.82*  --  3.17*  HCT 24.5* 27.9* 27.7*  PLT 137*  --  138*   Recent Labs    07/18/20 0151 07/19/20 0453  NA 137 137  K 4.7 4.0  CL 104 103  CO2 25 23  BUN 16 19  CREATININE 1.01* 0.98  GLUCOSE 119* 123*  CALCIUM 7.0* 7.2*   No results for input(s): LABPT, INR in the last 72 hours.  Neurologically intact Neurovascular intact Sensation intact distally Intact pulses distally Dorsiflexion/Plantar flexion intact Incision: dressing C/D/I No cellulitis present Compartment soft   Assessment/Plan: 2 Days Post-Op Procedure(s) (LRB): HEMI HIP ARTHROPLASTY ANTERIOR APPROACH (Left) Up with therapy WBAT LLE ABLA- mild and stable F/u with Dr. Erlinda Hong 2 weeks post-op      Aundra Dubin 07/19/2020, 8:18 AM

## 2020-07-19 NOTE — Evaluation (Signed)
Occupational Therapy Evaluation Patient Details Name: Brandi Raymond MRN: [id number] DOB: 08-May-1934 Today's Date: 07/19/2020    History of Present Illness The pt is an 87 yo female presenting s/p hemi arthroplasty of L hip, anterior approach, to repair L hip fx sustained in a fall at home. PMH includes: arthritis, CAD, COPD on 3L O2 at baseline, GERD, HTN, HLD, and memory loss.   Clinical Impression   Patient is s/p L Hip anterior hemi arthroplasty surgery resulting in functional limitations due to the deficits listed below (see OT problem list). Pt returning from bathroom with RN on arrival and DOE 2 out 4 at the time. Pt reports needing to return to supine and rest. OT completed evaluation at this time and will follow up with treatment session of goals focused on education of anterior hip precautions.  Patient will benefit from skilled OT acutely to increase independence and safety with ADLS to allow discharge SNF.     Follow Up Recommendations  SNF    Equipment Recommendations  3 in 1 bedside commode;Other (comment) (RW)    Recommendations for Other Services       Precautions / Restrictions Precautions Precautions: Anterior Hip;Fall Precaution Comments: no recall of precautions Restrictions Weight Bearing Restrictions: Yes LLE Weight Bearing: Weight bearing as tolerated      Mobility Bed Mobility Overal bed mobility: Needs Assistance Bed Mobility: Sit to Supine       Sit to supine: Mod assist   General bed mobility comments: needs (A) To keep bil LE together. pt needs cues to help position in bed.    Transfers Overall transfer level: Needs assistance Equipment used: Rolling walker (2 wheeled)   Sit to Stand: Mod assist         General transfer comment: pt needs cues for hand placement and bil le anterior sliding due to posterior bias with full extension of bil LE.    Balance           Standing balance support: Bilateral upper extremity  supported Standing balance-Leahy Scale: Poor Standing balance comment: reliant on UE support                           ADL either performed or assessed with clinical judgement   ADL Overall ADL's : Needs assistance/impaired Eating/Feeding: Set up   Grooming: Set up   Upper Body Bathing: Set up   Lower Body Bathing: Moderate assistance       Lower Body Dressing: Moderate assistance Lower Body Dressing Details (indicate cue type and reason): needed cues . pt was able to figure 4 the R LE to doff sock. pt was unable to complete LLE and attempting to cross LE with cues to aviod. Toilet Transfer: Moderate assistance;RW             General ADL Comments: pt noted to have a posterior bias and full extension of bil LE with standing. PT with bil LE sliding forward due to bias     Vision Baseline Vision/History: Wears glasses Wears Glasses: At all times       Perception     Praxis      Pertinent Vitals/Pain Pain Assessment: Faces Faces Pain Scale: Hurts little more Pain Location: L hip Pain Descriptors / Indicators: Discomfort;Grimacing;Sore Pain Intervention(s): Monitored during session;Repositioned;Ice applied     Hand Dominance Right   Extremity/Trunk Assessment Upper Extremity Assessment Upper Extremity Assessment: Overall WFL for tasks assessed   Lower Extremity Assessment Lower  Extremity Assessment: Defer to PT evaluation   Cervical / Trunk Assessment Cervical / Trunk Assessment: Kyphotic   Communication Communication Communication: No difficulties   Cognition Arousal/Alertness: Awake/alert Behavior During Therapy: WFL for tasks assessed/performed Overall Cognitive Status: History of cognitive impairments - at baseline                                 General Comments: no recall of anterior hip precautions   General Comments  2L 02 with DOE    Exercises     Shoulder Instructions      Home Living Family/patient expects to  be discharged to:: Skilled nursing facility                                        Prior Functioning/Environment Level of Independence: Independent with assistive device(s)        Comments: O2 condenser in home, uses rollator for mobility        OT Problem List: Decreased strength;Decreased activity tolerance;Impaired balance (sitting and/or standing);Decreased range of motion;Decreased coordination;Decreased cognition;Decreased safety awareness;Decreased knowledge of use of DME or AE;Decreased knowledge of precautions;Pain      OT Treatment/Interventions: Self-care/ADL training;Therapeutic exercise;Neuromuscular education;Energy conservation;DME and/or AE instruction;Manual therapy;Modalities;Therapeutic activities;Cognitive remediation/compensation;Patient/family education;Balance training    OT Goals(Current goals can be found in the care plan section) Acute Rehab OT Goals Patient Stated Goal: walk with rollator OT Goal Formulation: With family Time For Goal Achievement: 08/02/20 Potential to Achieve Goals: Good  OT Frequency: Min 2X/week   Barriers to D/C: Decreased caregiver support          Co-evaluation              AM-PAC OT "6 Clicks" Daily Activity     Outcome Measure Help from another person eating meals?: A Little Help from another person taking care of personal grooming?: A Little Help from another person toileting, which includes using toliet, bedpan, or urinal?: A Lot Help from another person bathing (including washing, rinsing, drying)?: A Lot Help from another person to put on and taking off regular upper body clothing?: A Little Help from another person to put on and taking off regular lower body clothing?: A Lot 6 Click Score: 15   End of Session Equipment Utilized During Treatment: Rolling walker Nurse Communication: Mobility status;Precautions;Weight bearing status  Activity Tolerance: Patient tolerated treatment well Patient  left: in bed;with call bell/phone within reach;with nursing/sitter in room;with family/visitor present  OT Visit Diagnosis: Unsteadiness on feet (R26.81);Muscle weakness (generalized) (M62.81);Pain Pain - Right/Left: Left Pain - part of body: Hip                Time: 1357-1410 OT Time Calculation (min): 13 min Charges:  OT General Charges $OT Visit: 1 Visit OT Evaluation $OT Eval Moderate Complexity: 1 Mod   Brynn, OTR/L  Acute Rehabilitation Services Pager: [phone] Office: [phone] .   Jeri Modena 07/19/2020, 2:44 PM

## 2020-07-20 ENCOUNTER — Encounter (HOSPITAL_COMMUNITY): Payer: Self-pay | Admitting: Orthopaedic Surgery

## 2020-07-20 DIAGNOSIS — S72002A Fracture of unspecified part of neck of left femur, initial encounter for closed fracture: Secondary | ICD-10-CM | POA: Diagnosis not present

## 2020-07-20 LAB — COMPREHENSIVE METABOLIC PANEL
ALT: 8 U/L (ref 0–44)
AST: 25 U/L (ref 15–41)
Albumin: 2.2 g/dL — ABNORMAL LOW (ref 3.5–5.0)
Alkaline Phosphatase: 81 U/L (ref 38–126)
Anion gap: 8 (ref 5–15)
BUN: 15 mg/dL (ref 8–23)
CO2: 28 mmol/L (ref 22–32)
Calcium: 7.7 mg/dL — ABNORMAL LOW (ref 8.9–10.3)
Chloride: 105 mmol/L (ref 98–111)
Creatinine, Ser: 0.85 mg/dL (ref 0.44–1.00)
Glucose, Bld: 113 mg/dL — ABNORMAL HIGH (ref 70–99)
Potassium: 4.9 mmol/L (ref 3.5–5.1)
Sodium: 141 mmol/L (ref 135–145)
Total Bilirubin: 1 mg/dL (ref 0.3–1.2)
Total Protein: 5.3 g/dL — ABNORMAL LOW (ref 6.5–8.1)

## 2020-07-20 LAB — VITAMIN B12: Vitamin B-12: 264 pg/mL (ref 180–914)

## 2020-07-20 LAB — IRON AND TIBC
Iron: 18 ug/dL — ABNORMAL LOW (ref 28–170)
Saturation Ratios: 7 % — ABNORMAL LOW (ref 10.4–31.8)
TIBC: 241 ug/dL — ABNORMAL LOW (ref 250–450)
UIBC: 223 ug/dL

## 2020-07-20 LAB — CBC
HCT: 29 % — ABNORMAL LOW (ref 36.0–46.0)
Hemoglobin: 8.6 g/dL — ABNORMAL LOW (ref 12.0–15.0)
MCH: 26.3 pg (ref 26.0–34.0)
MCHC: 29.7 g/dL — ABNORMAL LOW (ref 30.0–36.0)
MCV: 88.7 fL (ref 80.0–100.0)
Platelets: 142 10*3/uL — ABNORMAL LOW (ref 150–400)
RBC: 3.27 MIL/uL — ABNORMAL LOW (ref 3.87–5.11)
RDW: 17.1 % — ABNORMAL HIGH (ref 11.5–15.5)
WBC: 10 10*3/uL (ref 4.0–10.5)
nRBC: 0.2 % (ref 0.0–0.2)

## 2020-07-20 LAB — FOLATE: Folate: 13.7 ng/mL (ref >5.9)

## 2020-07-20 LAB — FERRITIN: Ferritin: 50 ng/mL (ref 11–307)

## 2020-07-20 MED ORDER — VITAMIN B-12 1000 MCG PO TABS
1000.0000 ug | ORAL_TABLET | Freq: Every day | ORAL | Status: DC
  Administered 2020-07-20 – 2020-07-24 (×5): 1000 ug via ORAL
  Filled 2020-07-20 (×5): qty 1

## 2020-07-20 MED ORDER — FERROUS SULFATE 325 (65 FE) MG PO TABS
325.0000 mg | ORAL_TABLET | Freq: Every day | ORAL | Status: DC
Start: 2020-07-21 — End: 2020-07-24
  Administered 2020-07-21 – 2020-07-24 (×4): 325 mg via ORAL
  Filled 2020-07-20 (×4): qty 1

## 2020-07-20 NOTE — NC FL2 (Addendum)
[place] MEDICAID FL2 LEVEL OF CARE SCREENING TOOL     IDENTIFICATION  Patient Name: Brandi Raymond Birthdate: April 06, 1934 Sex: female Admission Date (Current Location): 07/15/2020  [facility] and [place] Number:  Herbalist and Address:  [facility], [place] [address] [place], [place] [address]      Provider Number: [id number]  Attending Physician Name and Address:  [facility] Relative Name and Phone Number:  Brandi Raymond - [phone]    Current Level of Care: Hospital Recommended Level of Care: [place] Prior Approval Number:    Date Approved/Denied:   PASRR Number:  (Submitted for PASRR 07/20/20 - Additional information requested)  Discharge Plan: Home    Current Diagnoses: Patient Active Problem List   Diagnosis Date Noted  . Displaced fracture of left femoral neck ([place]) 07/17/2020  . Closed left hip fracture ([place]) 07/15/2020  . Goals of care, counseling/discussion 06/16/2020  . Sore throat 06/16/2020  . Large hiatal hernia 04/07/2020  . Dysphagia 04/07/2020  . Pacemaker 03/16/2020  . Chronic anticoagulation 03/16/2020  . Encounter for medication review and counseling 03/15/2020  . Gastritis with hemorrhage 03/04/2020  . PNA (pneumonia) 02/12/2020  . Right lower lobe pneumonia 01/13/2020  . CAP (community acquired pneumonia) 01/11/2020  . Acute respiratory failure ([place]) 09/02/2019  . Acute diastolic CHF (congestive heart failure) ([place]) 09/02/2019  . Paroxysmal atrial fibrillation (HCC)   . Second degree Mobitz II AV block 08/21/2019  . Second degree AV block 08/08/2019  . SOB (shortness of breath) 08/05/2019  . Chest tightness 08/05/2019  . GERD (gastroesophageal reflux disease) 05/21/2019  . Chronic respiratory failure with hypoxia ([place]) 05/21/2019  . Medication management 05/21/2019  . Stage I squamous cell carcinoma of right lung ([place]) 02/06/2019  . Constipation 09/11/2018  . COPD exacerbation ([place])  09/04/2018  . Right lower lobe lung mass 09/04/2018  . Closed compression fracture of L1 lumbar vertebra, initial encounter ([place]) 09/04/2018  . Preoperative clearance 07/19/2018  . Lower back pain 06/25/2018  . Cubital tunnel syndrome on left 05/22/2018  . Dupuytren's contracture of both hands 05/10/2018  . Primary osteoarthritis of both knees 05/10/2018  . Difficulty urinating 05/10/2018  . Osteoporosis 06/14/2017  . Prediabetes 06/23/2015  . Depression 06/23/2015  . Carotid stenosis 10/12/2014  . Sleep disorder 04/09/2014  . Dizziness 01/20/2013  . Mesenteric artery stenosis ([place]) 01/13/2013  . Thoracic aneurysm without mention of rupture 11/04/2012  . Chronic mesenteric ischemia ([place]) 10/08/2012  . Memory deficit 06/20/2012  . Irritable bowel syndrome 06/20/2012  . Ocular myasthenia gravis ([place]) 06/20/2012  . PVD (peripheral vascular disease) ([place]) 06/20/2012  . Hereditary and idiopathic peripheral neuropathy 05/22/2012  . Syncope 08/28/2011  . Anemia 07/22/2010  . ABDOMINAL BRUIT 08/17/2009  . Coronary atherosclerosis 09/05/2008  . Hypothyroidism 05/26/2008  . VITAMIN D DEFICIENCY 05/26/2008  . CHOLELITHIASIS 12/06/2007  . DIVERTICULOSIS, COLON 12/05/2007  . HYPERLIPIDEMIA 08/19/2007  . COPD (chronic obstructive pulmonary disease) with emphysema ([place]) 08/19/2007  . CIGARETTE SMOKER 02/06/2007  . Essential hypertension 02/06/2007  . COLONIC POLYPS 11/21/2004    Orientation RESPIRATION BLADDER Height & Weight     Self,Time,Situation,Place  O2 (3 Liters oxygen) Incontinent Weight: 135 lb (61.2 kg) Height:  5\' 4"  (162.6 cm)  BEHAVIORAL SYMPTOMS/MOOD NEUROLOGICAL BOWEL NUTRITION STATUS      Continent Diet (Regular)  AMBULATORY STATUS COMMUNICATION OF NEEDS Skin   Extensive Assist (Min/mod assist per PT) Verbally Other (Comment) (Ecchymosis bilateral legs; Incision left hip with hydrocolloid dressing)  Personal Care Assistance Level of  Assistance  Bathing,Feeding,Dressing Bathing Assistance: Maximum assistance (Mod assist per OT) Feeding assistance: Limited assistance (Assistance with set-up) Dressing Assistance: Maximum assistance (Mod assist per OT)     Functional Limitations Info  Sight,Hearing,Speech Sight Info: Impaired (Wears glasses) Hearing Info: Adequate (Wears glasses) Speech Info: Adequate    SPECIAL CARE FACTORS FREQUENCY  PT (By licensed PT),OT (By licensed OT)     PT Frequency: Evaluated 07/17/20. PT at SNF a minimum of 5 days per week OT Frequency: Evaluated 07/19/20. OT at SNF a minimum of 5 days per week            Contractures Contractures Info: Not present    Additional Factors Info  Code Status,Allergies Code Status Info: DNR Allergies Info: Silver Sulfadiazine           Current Medications (07/20/2020):  This is the current hospital active medication list Current Facility-Administered Medications  Medication Dose Route Frequency Provider Last Rate Last Admin  . 0.9 %  sodium chloride infusion   Intravenous Continuous Leandrew Koyanagi, MD   Stopped at 07/17/20 0531  . acetaminophen (TYLENOL) tablet 325-650 mg  325-650 mg Oral Q6H PRN Leandrew Koyanagi, MD   650 mg at 07/19/20 1403  . alum & mag hydroxide-simeth (MAALOX/MYLANTA) 200-200-20 MG/5ML suspension 30 mL  30 mL Oral Q4H PRN Leandrew Koyanagi, MD      . apixaban Arne Cleveland) tablet 2.5 mg  2.5 mg Oral BID Leandrew Koyanagi, MD   2.5 mg at 07/20/20 0842  . arformoterol (BROVANA) nebulizer solution 15 mcg  15 mcg Nebulization BID Leandrew Koyanagi, MD   15 mcg at 07/19/20 2058  . clonazePAM (KLONOPIN) tablet 0.5 mg  0.5 mg Oral QHS Leandrew Koyanagi, MD   0.5 mg at 07/19/20 2052  . docusate sodium (COLACE) capsule 100 mg  100 mg Oral BID Leandrew Koyanagi, MD   100 mg at 07/20/20 6283  . donepezil (ARICEPT) tablet 10 mg  10 mg Oral QHS Leandrew Koyanagi, MD   10 mg at 07/19/20 2051  . DULoxetine (CYMBALTA) DR capsule 60 mg  60 mg Oral Daily Leandrew Koyanagi, MD   60 mg  at 07/20/20 0842  . feeding supplement (ENSURE ENLIVE / ENSURE PLUS) liquid 237 mL  237 mL Oral BID BM Leandrew Koyanagi, MD      . Derrill Memo ON 07/21/2020] ferrous sulfate tablet 325 mg  325 mg Oral Q breakfast Elodia Florence., MD      . gabapentin (NEURONTIN) capsule 300 mg  300 mg Oral TID Leandrew Koyanagi, MD   300 mg at 07/20/20 1557  . HYDROmorphone (DILAUDID) injection 0.5-1 mg  0.5-1 mg Intravenous Q4H PRN Leandrew Koyanagi, MD   1 mg at 07/17/20 2113  . ipratropium (ATROVENT) nebulizer solution 0.5 mg  0.5 mg Nebulization Q6H PRN Leandrew Koyanagi, MD      . levothyroxine (SYNTHROID) tablet 125 mcg  125 mcg Oral QAC breakfast Leandrew Koyanagi, MD   125 mcg at 07/20/20 0529  . magnesium citrate solution 1 Bottle  1 Bottle Oral Once PRN Leandrew Koyanagi, MD      . menthol-cetylpyridinium (CEPACOL) lozenge 3 mg  1 lozenge Oral PRN Leandrew Koyanagi, MD       Or  . phenol (CHLORASEPTIC) mouth spray 1 spray  1 spray Mouth/Throat PRN Leandrew Koyanagi, MD      . methocarbamol (ROBAXIN) tablet 500 mg  500 mg Oral Q6H PRN Leandrew Koyanagi, MD   500 mg at 07/19/20 0600   Or  . methocarbamol (ROBAXIN) 500 mg in dextrose 5 % 50 mL IVPB  500 mg Intravenous Q6H PRN Leandrew Koyanagi, MD      . metoprolol tartrate (LOPRESSOR) injection 2.5 mg  2.5 mg Intravenous Q6H PRN Leandrew Koyanagi, MD   2.5 mg at 07/16/20 0900  . metoprolol tartrate (LOPRESSOR) tablet 25 mg  25 mg Oral Daily Leandrew Koyanagi, MD   25 mg at 07/20/20 0842  . multivitamin with minerals tablet 1 tablet  1 tablet Oral Daily Leandrew Koyanagi, MD   1 tablet at 07/20/20 [phone]  . ondansetron (ZOFRAN) tablet 4 mg  4 mg Oral Q6H PRN Leandrew Koyanagi, MD       Or  . ondansetron [facility]) injection 4 mg  4 mg Intravenous Q6H PRN Leandrew Koyanagi, MD      . oxyCODONE (Oxy IR/ROXICODONE) immediate release tablet 10-15 mg  10-15 mg Oral Q4H PRN Leandrew Koyanagi, MD   10 mg at 07/20/20 1557  . oxyCODONE (Oxy IR/ROXICODONE) immediate release tablet 5-10 mg  5-10 mg Oral Q4H PRN Leandrew Koyanagi, MD    5 mg at 07/19/20 1402  . pantoprazole (PROTONIX) EC tablet 40 mg  40 mg Oral BID Leandrew Koyanagi, MD   40 mg at 07/20/20 0841  . polyethylene glycol (MIRALAX / GLYCOLAX) packet 17 g  17 g Oral Daily PRN Leandrew Koyanagi, MD   17 g at 07/20/20 0842  . pravastatin (PRAVACHOL) tablet 40 mg  40 mg Oral Daily Leandrew Koyanagi, MD   40 mg at 07/20/20 0842  . predniSONE (DELTASONE) tablet 10 mg  10 mg Oral Q breakfast Leandrew Koyanagi, MD   10 mg at 07/20/20 0842  . senna (SENOKOT) tablet 8.6 mg  1 tablet Oral BID Leandrew Koyanagi, MD   8.6 mg at 07/20/20 8416  . sorbitol 70 % solution 30 mL  30 mL Oral Daily PRN Leandrew Koyanagi, MD      . umeclidinium bromide (INCRUSE ELLIPTA) 62.5 MCG/INH 1 puff  1 puff Inhalation Daily Leandrew Koyanagi, MD   1 puff at 07/19/20 0906  . vitamin B-12 (CYANOCOBALAMIN) tablet 1,000 mcg  1,000 mcg Oral Daily Elodia Florence., MD   1,000 mcg at 07/20/20 6063  . zolpidem (AMBIEN) tablet 5 mg  5 mg Oral QHS PRN Leandrew Koyanagi, MD         Discharge Medications: Please see discharge summary for a list of discharge medications.  Relevant Imaging Results:  Relevant Lab Results:   Additional Information [phone]  Sable Feil, LCSW

## 2020-07-20 NOTE — Telephone Encounter (Signed)
Pulmonary clearance form faxed with confirmation received.  Placed form and confirmation sheet back with Dr. Matilde Bash forms to be scanned.  lmtcb X1 for daughter to make aware.

## 2020-07-20 NOTE — Telephone Encounter (Signed)
Requested pulmonary clearance for planned kyphoplasty by [facility]  On review of chart it appears she got admitted on 07/15/2020 after a fall and hip fracture s/p hemi arthroplasty of L hip, anterior approach, to repair L hip fx sustained in a fall at home on 07/17/20 She tolerated general anesthesia without any pulmonary complication  She is at higher risk for kyphoplasty given severe COPD at baseline but no absolute contraindication Clearance form signed and placed in folder to be sent back.  Marshell Garfinkel MD [facility] 07/20/2020, 12:23 PM

## 2020-07-20 NOTE — TOC Initial Note (Signed)
Transition of Care [facility]) - Initial/Assessment Note    Patient Details  Name: Brandi Raymond MRN: [id number] Date of Birth: September 18, 1933  Transition of [facility]) CM/SW Contact:    Brandi Feil, LCSW Phone Number: 07/20/2020, 6:05 PM  Clinical Narrative: Visited with patient to discuss the recommendation of ST rehab. [name] was sitting up in bed and was alert, oriented and able to talk with CSW. She asked that CSW contact her daughter Brandi Raymond [phone]).  Talked with daughter Brandi Raymond by phone regarding ST rehab and she expressed agreement. When asked, daughter responded that her mother has never been to [facility] re[facility]. She was advised about [email or web address].                 Expected Discharge Plan: Skilled Nursing Facility Barriers to Discharge: Other (comment)   Patient Goals and CMS Choice Patient states their goals for this hospitalization and ongoing recovery are:: Daughter in agreement with ST rehab (PASRR number, Facility search and insurance auth) CMS Medicare.gov Compare Post Acute Care list provided to:: Patient Represenative (must comment) (Daughter informed about [email or web address]) Choice offered to / list presented to : Adult Children (Advised daughter regarding [email or web address])  Expected Discharge Plan and Services Expected Discharge Plan: Skilled Nursing Facility In-house Referral: Clinical Social Work                                           Prior Living Arrangements/Services   Lives with:: Spouse Patient language and need for interpreter reviewed:: Yes Do you feel safe going back to the place where you live?: No   Daughter and patient in agreement with ST rehab  Need for Family Participation in Patient Care: Yes (Comment) Care giver support system in place?: Yes (comment)   Criminal Activity/Legal Involvement Pertinent to Current Situation/Hospitalization: No - Comment as needed  Activities of Daily Living Home Assistive Devices/Equipment:  Dentures (specify type),Walker (specify type) ADL Screening (condition at time of admission) Patient's cognitive ability adequate to safely complete daily activities?: Yes Is the patient deaf or have difficulty hearing?: No Does the patient have difficulty seeing, even when wearing glasses/contacts?: No Does the patient have difficulty concentrating, remembering, or making decisions?: No Patient able to express need for assistance with ADLs?: Yes Does the patient have difficulty dressing or bathing?: No Independently performs ADLs?: Yes (appropriate for developmental age) Does the patient have difficulty walking or climbing stairs?: Yes Weakness of Legs: Left Weakness of Arms/Hands: None  Permission Sought/Granted Permission sought to share information with : Family Supports Permission granted to share information with : Yes, Verbal Permission Granted  Share Information with NAME: Brandi Raymond     Permission granted to share info w Relationship: Daughter  Permission granted to share info w Contact Information: [phone]  Emotional Assessment Appearance:: Appears stated age Attitude/Demeanor/Rapport: Engaged Affect (typically observed): Pleasant Orientation: : Oriented to Self,Oriented to Place,Oriented to  Time,Oriented to Situation Alcohol / Substance Use: Tobacco Use,Alcohol Use,Illicit Drugs (Per H&P, patient quit smoking and does not drink  or use illicit drugs) Psych Involvement: No (comment)  Admission diagnosis:  Pain [R52] Fall [W19.XXXA] Closed left hip fracture ([place]) [S72.002A] Closed fracture of left hip, initial encounter [facility]) [S72.002A] Patient Active Problem List   Diagnosis Date Noted  . Displaced fracture of left femoral neck ([place]) 07/17/2020  . Closed left hip fracture ([place]) 07/15/2020  . Goals of care, counseling/discussion 06/16/2020  .  Sore throat 06/16/2020  . Large hiatal hernia 04/07/2020  . Dysphagia 04/07/2020  . Pacemaker 03/16/2020  . Chronic  anticoagulation 03/16/2020  . Encounter for medication review and counseling 03/15/2020  . Gastritis with hemorrhage 03/04/2020  . PNA (pneumonia) 02/12/2020  . Right lower lobe pneumonia 01/13/2020  . CAP (community acquired pneumonia) 01/11/2020  . Acute respiratory failure ([place]) 09/02/2019  . Acute diastolic CHF (congestive heart failure) ([place]) 09/02/2019  . Paroxysmal atrial fibrillation (HCC)   . Second degree Mobitz II AV block 08/21/2019  . Second degree AV block 08/08/2019  . SOB (shortness of breath) 08/05/2019  . Chest tightness 08/05/2019  . GERD (gastroesophageal reflux disease) 05/21/2019  . Chronic respiratory failure with hypoxia ([place]) 05/21/2019  . Medication management 05/21/2019  . Stage I squamous cell carcinoma of right lung ([place]) 02/06/2019  . Constipation 09/11/2018  . COPD exacerbation ([place]) 09/04/2018  . Right lower lobe lung mass 09/04/2018  . Closed compression fracture of L1 lumbar vertebra, initial encounter ([place]) 09/04/2018  . Preoperative clearance 07/19/2018  . Lower back pain 06/25/2018  . Cubital tunnel syndrome on left 05/22/2018  . Dupuytren's contracture of both hands 05/10/2018  . Primary osteoarthritis of both knees 05/10/2018  . Difficulty urinating 05/10/2018  . Osteoporosis 06/14/2017  . Prediabetes 06/23/2015  . Depression 06/23/2015  . Carotid stenosis 10/12/2014  . Sleep disorder 04/09/2014  . Dizziness 01/20/2013  . Mesenteric artery stenosis ([place]) 01/13/2013  . Thoracic aneurysm without mention of rupture 11/04/2012  . Chronic mesenteric ischemia ([place]) 10/08/2012  . Memory deficit 06/20/2012  . Irritable bowel syndrome 06/20/2012  . Ocular myasthenia gravis ([place]) 06/20/2012  . PVD (peripheral vascular disease) ([place]) 06/20/2012  . Hereditary and idiopathic peripheral neuropathy 05/22/2012  . Syncope 08/28/2011  . Anemia 07/22/2010  . ABDOMINAL BRUIT 08/17/2009  . Coronary atherosclerosis 09/05/2008  . Hypothyroidism 05/26/2008   . VITAMIN D DEFICIENCY 05/26/2008  . CHOLELITHIASIS 12/06/2007  . DIVERTICULOSIS, COLON 12/05/2007  . HYPERLIPIDEMIA 08/19/2007  . COPD (chronic obstructive pulmonary disease) with emphysema ([place]) 08/19/2007  . CIGARETTE SMOKER 02/06/2007  . Essential hypertension 02/06/2007  . COLONIC POLYPS 11/21/2004   PCP:  Binnie Rail, MD Pharmacy:   [facility] [place] [address] Phone: [phone] Fax: [phone]     Social Determinants of Health (SDOH) Interventions  No SDOH interventions requested or needed at this time   Readmission Risk Interventions Readmission Risk Prevention Plan 01/12/2020  PCP or Specialist Appt within 3-5 Days Complete  HRI or [place] Complete  Social Work Consult for [place] Planning/Counseling Complete  Palliative Care Screening Not Applicable  Medication Review [employer]) Complete  Some recent data might be hidden

## 2020-07-20 NOTE — Discharge Instructions (Addendum)
INSTRUCTIONS AFTER JOINT REPLACEMENT   o Remove items at home which could result in a fall. This includes throw rugs or furniture in walking pathways o ICE to the affected joint every three hours while awake for 30 minutes at a time, for at least the first 3-5 days, and then as needed for pain and swelling.  Continue to use ice for pain and swelling. You may notice swelling that will progress down to the foot and ankle.  This is normal after surgery.  Elevate your leg when you are not up walking on it.   o Continue to use the breathing machine you got in the hospital (incentive spirometer) which will help keep your temperature down.  It is common for your temperature to cycle up and down following surgery, especially at night when you are not up moving around and exerting yourself.  The breathing machine keeps your lungs expanded and your temperature down.   DIET:  As you were doing prior to hospitalization, we recommend a well-balanced diet.  DRESSING / WOUND CARE / SHOWERING  You may change your surgical dressing 7 days after surgery.  Then change the dressing every day with sterile gauze.  Please use good hand washing techniques before changing the dressing.  Do not use any lotions or creams on the incision until instructed by your surgeon.  You may shower while you have the surgical dressing which is waterproof.  After removal of surgical dressing, you must cover the incision when showering.  ACTIVITY  o Increase activity slowly as tolerated, but follow the weight bearing instructions below.   o No driving for 6 weeks or until further direction given by your physician.  You cannot drive while taking narcotics.  o No lifting or carrying greater than 10 lbs. until further directed by your surgeon. o Avoid periods of inactivity such as sitting longer than an hour when not asleep. This helps prevent blood clots.  o You may return to work once you are authorized by your doctor.     WEIGHT  BEARING   Weight bearing as tolerated with assist device (walker, cane, etc) as directed, use it as long as suggested by your surgeon or therapist, typically at least 4-6 weeks.   EXERCISES  Results after joint replacement surgery are often greatly improved when you follow the exercise, range of motion and muscle strengthening exercises prescribed by your doctor. Safety measures are also important to protect the joint from further injury. Any time any of these exercises cause you to have increased pain or swelling, decrease what you are doing until you are comfortable again and then slowly increase them. If you have problems or questions, call your caregiver or physical therapist for advice.   Rehabilitation is important following a joint replacement. After just a few days of immobilization, the muscles of the leg can become weakened and shrink (atrophy).  These exercises are designed to build up the tone and strength of the thigh and leg muscles and to improve motion. Often times heat used for twenty to thirty minutes before working out will loosen up your tissues and help with improving the range of motion but do not use heat for the first two weeks following surgery (sometimes heat can increase post-operative swelling).   These exercises can be done on a training (exercise) mat, on the floor, on a table or on a bed. Use whatever works the best and is most comfortable for you.    Use music or television  while you are exercising so that the exercises are a pleasant break in your day. This will make your life better with the exercises acting as a break in your routine that you can look forward to.   Perform all exercises about fifteen times, three times per day or as directed.  You should exercise both the operative leg and the other leg as well.  Exercises include:   . Quad Sets - Tighten up the muscle on the front of the thigh (Quad) and hold for 5-10 seconds.   . Straight Leg Raises - With your  knee straight (if you were given a brace, keep it on), lift the leg to 60 degrees, hold for 3 seconds, and slowly lower the leg.  Perform this exercise against resistance later as your leg gets stronger.  . Leg Slides: Lying on your back, slowly slide your foot toward your buttocks, bending your knee up off the floor (only go as far as is comfortable). Then slowly slide your foot back down until your leg is flat on the floor again.  [place] Haring Wings: Lying on your back spread your legs to the side as far apart as you can without causing discomfort.  . Hamstring Strength:  Lying on your back, push your heel against the floor with your leg straight by tightening up the muscles of your buttocks.  Repeat, but this time bend your knee to a comfortable angle, and push your heel against the floor.  You may put a pillow under the heel to make it more comfortable if necessary.   A rehabilitation program following joint replacement surgery can speed recovery and prevent re-injury in the future due to weakened muscles. Contact your doctor or a physical therapist for more information on knee rehabilitation.    CONSTIPATION  Constipation is defined medically as fewer than three stools per week and severe constipation as less than one stool per week.  Even if you have a regular bowel pattern at home, your normal regimen is likely to be disrupted due to multiple reasons following surgery.  Combination of anesthesia, postoperative narcotics, change in appetite and fluid intake all can affect your bowels.   YOU MUST use at least one of the following options; they are listed in order of increasing strength to get the job done.  They are all available over the counter, and you may need to use some, POSSIBLY even all of these options:    Drink plenty of fluids (prune juice may be helpful) and high fiber foods Colace 100 mg by mouth twice a day  Senokot for constipation as directed and as needed Dulcolax (bisacodyl), take  with full glass of water  Miralax (polyethylene glycol) once or twice a day as needed.  If you have tried all these things and are unable to have a bowel movement in the first 3-4 days after surgery call either your surgeon or your primary doctor.    If you experience loose stools or diarrhea, hold the medications until you stool forms back up.  If your symptoms do not get better within 1 week or if they get worse, check with your doctor.  If you experience "the worst abdominal pain ever" or develop nausea or vomiting, please contact the office immediately for further recommendations for treatment.   ITCHING:  If you experience itching with your medications, try taking only a single pain pill, or even half a pain pill at a time.  You can also use Benadryl over the  counter for itching or also to help with sleep.   TED HOSE STOCKINGS:  Use stockings on both legs until for at least 2 weeks or as directed by physician office. They may be removed at night for sleeping.  MEDICATIONS:  See your medication summary on the "After Visit Summary" that nursing will review with you.  You may have some home medications which will be placed on hold until you complete the course of blood thinner medication.  It is important for you to complete the blood thinner medication as prescribed.  PRECAUTIONS:  If you experience chest pain or shortness of breath - call 911 immediately for transfer to the hospital emergency department.   If you develop a fever greater that 101 F, purulent drainage from wound, increased redness or drainage from wound, foul odor from the wound/dressing, or calf pain - CONTACT YOUR SURGEON.                                                   FOLLOW-UP APPOINTMENTS:  If you do not already have a post-op appointment, please call the office for an appointment to be seen by your surgeon.  Guidelines for how soon to be seen are listed in your "After Visit Summary", but are typically between 1-4 weeks  after surgery.  OTHER INSTRUCTIONS:   Knee Replacement:  Do not place pillow under knee, focus on keeping the knee straight while resting. CPM instructions: 0-90 degrees, 2 hours in the morning, 2 hours in the afternoon, and 2 hours in the evening. Place foam block, curve side up under heel at all times except when in CPM or when walking.  DO NOT modify, tear, cut, or change the foam block in any way.  MAKE SURE YOU:  . Understand these instructions.  . Get help right away if you are not doing well or get worse.    Thank you for letting us be a part of your medical care team.  It is a privilege we respect greatly.  We hope these instructions will help you stay on track for a fast and full recovery!     Information on my medicine - ELIQUIS (apixaban)  This medication education was reviewed with me or my healthcare representative as part of my discharge preparation.    Why was Eliquis prescribed for you? Eliquis was prescribed for you to reduce the risk of a blood clot forming that can cause a stroke if you have a medical condition called atrial fibrillation (a type of irregular heartbeat).  What do You need to know about Eliquis ? Take your Eliquis TWICE DAILY - one tablet in the morning and one tablet in the evening with or without food. If you have difficulty swallowing the tablet whole please discuss with your pharmacist how to take the medication safely.  Take Eliquis exactly as prescribed by your doctor and DO NOT stop taking Eliquis without talking to the doctor who prescribed the medication.  Stopping may increase your risk of developing a stroke.  Refill your prescription before you run out.  After discharge, you should have regular check-up appointments with your healthcare provider that is prescribing your Eliquis.  In the future your dose may need to be changed if your kidney function or weight changes by a significant amount or as you get older.  What do you  do if you  miss a dose? If you miss a dose, take it as soon as you remember on the same day and resume taking twice daily.  Do not take more than one dose of ELIQUIS at the same time to make up a missed dose.  Important Safety Information A possible side effect of Eliquis is bleeding. You should call your healthcare provider right away if you experience any of the following: ? Bleeding from an injury or your nose that does not stop. ? Unusual colored urine (red or dark brown) or unusual colored stools (red or black). ? Unusual bruising for unknown reasons. ? A serious fall or if you hit your head (even if there is no bleeding).  Some medicines may interact with Eliquis and might increase your risk of bleeding or clotting while on Eliquis. To help avoid this, consult your healthcare provider or pharmacist prior to using any new prescription or non-prescription medications, including herbals, vitamins, non-steroidal anti-inflammatory drugs (NSAIDs) and supplements.  This website has more information on Eliquis (apixaban): http://www.eliquis.com/eliquis/home    Additional Discharge Instructions  Please get your medications reviewed and adjusted by your Primary MD.  Please request your Primary MD to go over all Hospital Tests and Procedure/Radiological results at the follow up, please get all Hospital records sent to your Prim MD by signing hospital release before you go home.  If you had Pneumonia of Lung problems at the Hospital: Please get a 2 view Chest X ray done in 6-8 weeks after hospital discharge or sooner if instructed by your Primary MD.  If you have Congestive Heart Failure: Please call your Cardiologist or Primary MD anytime you have any of the following symptoms:  1) 3 pound weight gain in 24 hours or 5 pounds in 1 week  2) shortness of breath, with or without a dry hacking cough  3) swelling in the hands, feet or stomach  4) if you have to sleep on extra pillows at night in order to  breathe  Follow cardiac low salt diet and 1.5 lit/day fluid restriction.  If you have diabetes Accuchecks 4 times/day, Once in AM empty stomach and then before each meal. Log in all results and show them to your primary doctor at your next visit. If any glucose reading is under 80 or above 300 call your primary MD immediately.  If you have Seizure/Convulsions/Epilepsy: Please do not drive, operate heavy machinery, participate in activities at heights or participate in high speed sports until you have seen by Primary MD or a Neurologist and advised to do so again.  If you had Gastrointestinal Bleeding: Please ask your Primary MD to check a complete blood count within one week of discharge or at your next visit. Your endoscopic/colonoscopic biopsies that are pending at the time of discharge, will also need to followed by your Primary MD.  Get Medicines reviewed and adjusted. Please take all your medications with you for your next visit with your Primary MD  Please request your Primary MD to go over all hospital tests and procedure/radiological results at the follow up, please ask your Primary MD to get all Hospital records sent to his/her office.  If you experience worsening of your admission symptoms, develop shortness of breath, life threatening emergency, suicidal or homicidal thoughts you must seek medical attention immediately by calling 911 or calling your MD immediately  if symptoms less severe.  You must read complete instructions/literature along with all the possible adverse reactions/side effects for all the  Medicines you take and that have been prescribed to you. Take any new Medicines after you have completely understood and accpet all the possible adverse reactions/side effects.   Do not drive or operate heavy machinery when taking Pain medications.   Do not take more than prescribed Pain, Sleep and Anxiety Medications  Special Instructions: If you have smoked or chewed  Tobacco  in the last 2 yrs please stop smoking, stop any regular Alcohol  and or any Recreational drug use.  Wear Seat belts while driving.  Please note You were cared for by a hospitalist during your hospital stay. If you have any questions about your discharge medications or the care you received while you were in the hospital after you are discharged, you can call the unit and asked to speak with the hospitalist on call if the hospitalist that took care of you is not available. Once you are discharged, your primary care physician will handle any further medical issues. Please note that NO REFILLS for any discharge medications will be authorized once you are discharged, as it is imperative that you return to your primary care physician (or establish a relationship with a primary care physician if you do not have one) for your aftercare needs so that they can reassess your need for medications and monitor your lab values.  You can reach the hospitalist office at phone [phone] or fax [phone]   If you do not have a primary care physician, you can call [phone] for a physician referral.

## 2020-07-20 NOTE — Progress Notes (Signed)
PROGRESS NOTE    Brandi Raymond  FWY:[id number] DOB: [birth year] DOA: 07/15/2020 PCP: Binnie Rail, MD   Chief Complaint  Patient presents with  . Fall    Brief Narrative:  Brandi Raymond is Brandi Raymond 87 y.o. female with medical history significant of Brandi Raymond fib, COPD, SCC right lung s/p XRT, GERD, Dementia, HTN, CAD, memory loss. She has multiple vertebral compressiosn fractrures and was scheduled for kyphoplasty. She was waiting for her grad-daughter to take to the doctor. She stood up, lost her balance and fell with subsequent pain and inability to bear weight. She comes to [facility] for evaluation.  ED Course: T 98.1  141/111  HR 81  RR 18, on O2. Cmet nl, Hgb 9.7 (chronic anemia). CXR NAD, emphysema, hiatal hernia. Xray left hip with impacted intertrochanteric fx. Pt seen by ortho and is for surgery 07/17/20. TRH called to admit, clear for surgery and manage medical issues.   She's now s/p prosthetic replacement for femoral neck fracture on 07/17/2020.  Currently awaiting SNF placement.  She was also seen by neurosurgery for compression fx, they recommend conservative measures, and follow outpatient.  Assessment & Plan:   Principal Problem:   Displaced fracture of left femoral neck (HCC) Active Problems:   Hypothyroidism   HYPERLIPIDEMIA   Essential hypertension   COPD (chronic obstructive pulmonary disease) with emphysema (HCC)   Anemia   Chronic mesenteric ischemia (HCC)   GERD (gastroesophageal reflux disease)   Paroxysmal atrial fibrillation (HCC)   Closed left hip fracture ([place])  1. Left femoral neck fracture - after mechanical fall - s/p prosthetic replacement for femoral neck fracture 07/17/2020 - eliquis for dvt ppx - APAP, oxy, morphine prn - bowel regimen - WBAT LLE, follow up with Dr. Erlinda Hong 2 weeks post op  # Post Operative Anemia  Iron Def Anemia: s/p 1 unit pRBC Stable, follow  Follow anemia panel - suggestive of IDA, B12 is borderline (supplement and follow MMA)  2. COPD  - oxygen dependent on 3 L at home. Currently stable Continue incruse ellipta, prn atrovent, brovana On chronic prednisone, continue   3. Compression fx's spine - active problem. Was for kyphoplasty - now delayed to #1 Compression fx of T12, L1, L3 on imaging Discussed with neurosurgery -> conservative measures for compression fx - brace for [place] OOB if needed, follow up outpatient in 1 month  4. Atrial Fibrillation with RVR  Rate controlled Follow with home PO metop Metop IV prn for sustained HR > 120 Continue eliquis  # Acute Kidney Injury: baseline creatinine is around 1.  Creatinine up today.  Hold lasix.   6. Hypothyroidism- continue home meds.  Follow TSH (wnl)  7. Memory loss - continue home meds.   8. Oncology - follows with Dr. Earlie Server for Fitzgerald right lung s/p xrt question of recurrence per grand-daughter. Currently stable.  # Hypotension: resolved,  DVT prophylaxis: heparin  Code Status: dnr Family Communication: none at bedside Disposition:   Status is: Inpatient  Remains inpatient appropriate because:Inpatient level of care appropriate due to severity of illness   Dispo: The patient is from: Home              Anticipated d/c is to: pending              Anticipated d/c date is: > 3 days              Patient currently is not medically stable to d/c.  Consultants:   Ortho  neurosurgery  Procedures:   none  Antimicrobials: Anti-infectives (From admission, onward)   Start     Dose/Rate Route Frequency Ordered Stop   07/17/20 2000  ceFAZolin (ANCEF) IVPB 2g/100 mL premix        2 g 200 mL/hr over 30 Minutes Intravenous Every 12 hours 07/17/20 1233 07/18/20 2341   07/17/20 0949  vancomycin (VANCOCIN) powder  Status:  Discontinued          As needed 07/17/20 0949 07/17/20 0949   07/16/20 0945  ceFAZolin (ANCEF) IVPB 2g/100 mL premix       Note to Pharmacy: Anesthesia to give preop   2 g 200 mL/hr over 30 Minutes Intravenous  Once 07/16/20 0941  07/16/20 1107        Subjective: No new complaints Asking about d/c plans, says her daughters said they'd talk to the SW  Objective: Vitals:   07/19/20 2209 07/20/20 0403 07/20/20 0827 07/20/20 1507  BP: 129/69 115/60 (!) 152/67 140/77  Pulse: 70 78 85 70  Resp: 15 15 17 16   Temp: 97.8 F (36.6 C) 98.6 F (37 C) 97.6 F (36.4 C) 97.8 F (36.6 C)  TempSrc: Oral Oral Oral Oral  SpO2: 100% 100% 96% 100%  Weight:      Height:        Intake/Output Summary (Last 24 hours) at 07/20/2020 1624 Last data filed at 07/20/2020 0900 Gross per 24 hour  Intake 240 ml  Output --  Net 240 ml   Filed Weights   07/15/20 1129  Weight: 61.2 kg    Examination:  General: No acute distress. Cardiovascular: Heart sounds show Brandi Raymond regular rate, and rhythm. Lungs: Clear to auscultation bilaterally  Abdomen: Soft, nontender, nondistended Neurological: Alert and oriented 3. Moves all extremities 4. Cranial nerves II through XII grossly intact. Skin: Warm and dry. No rashes or lesions. Extremities: LLE with intact dressing   Data Reviewed: I have personally reviewed following labs and imaging studies  CBC: Recent Labs  Lab 07/15/20 1237 07/16/20 0241 07/17/20 0245 07/18/20 0151 07/18/20 1756 07/19/20 0453 07/20/20 0252  WBC 8.5 9.6 9.7 10.6*  --  11.2* 10.0  NEUTROABS 7.1  --   --   --   --   --   --   HGB 9.7* 9.8* 9.4* 7.6* 8.8* 8.4* 8.6*  HCT 34.8* 34.3* 31.0* 24.5* 27.9* 27.7* 29.0*  MCV 90.6 89.6 87.8 86.9  --  87.4 88.7  PLT 191 161 138* 137*  --  138* 142*    Basic Metabolic Panel: Recent Labs  Lab 07/15/20 1237 07/16/20 1852 07/17/20 2048 07/18/20 0151 07/19/20 0453 07/20/20 0252  NA 138 135  --  137 137 141  K 4.7 4.2  --  4.7 4.0 4.9  CL 101 98  --  104 103 105  CO2 27 23  --  25 23 28   GLUCOSE 102* 128*  --  119* 123* 113*  BUN 20 23  --  16 19 15   CREATININE 1.05* 1.34*  --  1.01* 0.98 0.85  CALCIUM 8.3* 7.2*  --  7.0* 7.2* 7.7*  PHOS  --   --  2.9 2.6   --   --     GFR: Estimated Creatinine Clearance: 42.5 mL/min (by C-G formula based on SCr of 0.85 mg/dL).  Liver Function Tests: Recent Labs  Lab 07/15/20 1237 07/16/20 1852 07/18/20 0151 07/20/20 0252  AST 26 35 22 25  ALT 21 18 10 8   ALKPHOS 112 103 72 81  BILITOT  0.4 0.7 0.8 1.0  PROT 6.1* 5.9* 5.1* 5.3*  ALBUMIN 3.2* 2.9* 2.4* 2.2*    CBG: No results for input(s): GLUCAP in the last 168 hours.   Recent Results (from the past 240 hour(s))  Resp Panel by RT-PCR (Flu Garrell Flagg&B, Covid) Nasopharyngeal Swab     Status: None   Collection Time: 07/15/20 12:45 PM   Specimen: Nasopharyngeal Swab; Nasopharyngeal(NP) swabs in vial transport medium  Result Value Ref Range Status   SARS Coronavirus 2 by RT PCR NEGATIVE NEGATIVE Final    Comment: (NOTE) SARS-CoV-2 target nucleic acids are NOT DETECTED.  The SARS-CoV-2 RNA is generally detectable in upper respiratory specimens during the acute phase of infection. The lowest concentration of SARS-CoV-2 viral copies this assay can detect is 138 copies/mL. Kadeisha Betsch negative result does not preclude SARS-Cov-2 infection and should not be used as the sole basis for treatment or other patient management decisions. Brandi Raymond negative result may occur with  improper specimen collection/handling, submission of specimen other than nasopharyngeal swab, presence of viral mutation(s) within the areas targeted by this assay, and inadequate number of viral copies(<138 copies/mL). Brandi Raymond negative result must be combined with clinical observations, patient history, and epidemiological information. The expected result is Negative.  Fact Sheet for Patients:  EntrepreneurPulse.com.au  Fact Sheet for Healthcare Providers:  IncredibleEmployment.be  This test is no t yet approved or cleared by the [place] FDA and  has been authorized for detection and/or diagnosis of SARS-CoV-2 by FDA under an Emergency Use Authorization (EUA).  This EUA will remain  in effect (meaning this test can be used) for the duration of the COVID-19 declaration under Section 564(b)(1) of the Act, 21 U.S.C.section 360bbb-3(b)(1), unless the authorization is terminated  or revoked sooner.       Influenza Brandi Raymond by PCR NEGATIVE NEGATIVE Final   Influenza B by PCR NEGATIVE NEGATIVE Final    Comment: (NOTE) The Xpert Xpress SARS-CoV-2/FLU/RSV plus assay is intended as an aid in the diagnosis of influenza from Nasopharyngeal swab specimens and should not be used as Brandi Raymond sole basis for treatment. Nasal washings and aspirates are unacceptable for Xpert Xpress SARS-CoV-2/FLU/RSV testing.  Fact Sheet for Patients: EntrepreneurPulse.com.au  Fact Sheet for Healthcare Providers: IncredibleEmployment.be  This test is not yet approved or cleared by the [place] FDA and has been authorized for detection and/or diagnosis of SARS-CoV-2 by FDA under an Emergency Use Authorization (EUA). This EUA will remain in effect (meaning this test can be used) for the duration of the COVID-19 declaration under Section 564(b)(1) of the Act, 21 U.S.C. section 360bbb-3(b)(1), unless the authorization is terminated or revoked.  Performed at [facility], [place] [address], [place], [place] [address]   Surgical pcr screen     Status: None   Collection Time: 07/16/20  4:29 PM   Specimen: Nasal Mucosa; Nasal Swab  Result Value Ref Range Status   MRSA, PCR NEGATIVE NEGATIVE Final   Staphylococcus aureus NEGATIVE NEGATIVE Final    Comment: (NOTE) The Xpert SA Assay (FDA approved for NASAL specimens in patients 74 years of age and older), is one component of Brandi Raymond comprehensive surveillance program. It is not intended to diagnose infection nor to guide or monitor treatment. Performed at [facility], [place] [address], [place], [place] [address]          Radiology Studies: No results found.      Scheduled  Meds: . apixaban  2.5 mg Oral BID  . arformoterol  15 mcg  Nebulization BID  . clonazePAM  0.5 mg Oral QHS  . docusate sodium  100 mg Oral BID  . donepezil  10 mg Oral QHS  . DULoxetine  60 mg Oral Daily  . feeding supplement  237 mL Oral BID BM  . gabapentin  300 mg Oral TID  . levothyroxine  125 mcg Oral QAC breakfast  . metoprolol tartrate  25 mg Oral Daily  . multivitamin with minerals  1 tablet Oral Daily  . pantoprazole  40 mg Oral BID  . pravastatin  40 mg Oral Daily  . predniSONE  10 mg Oral Q breakfast  . senna  1 tablet Oral BID  . umeclidinium bromide  1 puff Inhalation Daily  . vitamin B-12  1,000 mcg Oral Daily   Continuous Infusions: . sodium chloride Stopped (07/17/20 0531)  . methocarbamol (ROBAXIN) IV       LOS: 5 days    Time spent: over 30 min    Fayrene Helper, MD [facility]   To contact the attending provider between 7A-7P or the covering provider during after hours 7P-7A, please log into the web site [email or web address] and access using universal [place] password for that web site. If you do not have the password, please call the hospital operator.  07/20/2020, 4:24 PM

## 2020-07-20 NOTE — Plan of Care (Signed)

## 2020-07-20 NOTE — TOC CAGE-AID Note (Signed)
Transition of Care [facility]) - CAGE-AID Screening   Patient Details  Name: Brandi Raymond MRN: [id number] Date of Birth: 19-Aug-1933   Clinical Narrative: Patient denies current alcohol and drug use.   CAGE-AID Screening:    Have You Ever Felt You Ought to Cut Down on Your Drinking or Drug Use?: No Have People Annoyed You By Critizing Your Drinking Or Drug Use?: No Have You Felt Bad Or Guilty About Your Drinking Or Drug Use?: No Have You Ever Had a Drink or Used Drugs First Thing In The Morning to Steady Your Nerves or to Get Rid of a Hangover?: No CAGE-AID Score: 0  Substance Abuse Education Offered: No

## 2020-07-20 NOTE — Plan of Care (Signed)

## 2020-07-21 ENCOUNTER — Encounter: Payer: Self-pay | Admitting: Orthopaedic Surgery

## 2020-07-21 DIAGNOSIS — S72002A Fracture of unspecified part of neck of left femur, initial encounter for closed fracture: Secondary | ICD-10-CM | POA: Diagnosis not present

## 2020-07-21 LAB — CBC
HCT: 30.7 % — ABNORMAL LOW (ref 36.0–46.0)
Hemoglobin: 9 g/dL — ABNORMAL LOW (ref 12.0–15.0)
MCH: 26.2 pg (ref 26.0–34.0)
MCHC: 29.3 g/dL — ABNORMAL LOW (ref 30.0–36.0)
MCV: 89.5 fL (ref 80.0–100.0)
Platelets: 158 10*3/uL (ref 150–400)
RBC: 3.43 MIL/uL — ABNORMAL LOW (ref 3.87–5.11)
RDW: 16.8 % — ABNORMAL HIGH (ref 11.5–15.5)
WBC: 9.4 10*3/uL (ref 4.0–10.5)
nRBC: 0.3 % — ABNORMAL HIGH (ref 0.0–0.2)

## 2020-07-21 LAB — COMPREHENSIVE METABOLIC PANEL
ALT: 10 U/L (ref 0–44)
AST: 25 U/L (ref 15–41)
Albumin: 2.2 g/dL — ABNORMAL LOW (ref 3.5–5.0)
Alkaline Phosphatase: 86 U/L (ref 38–126)
Anion gap: 11 (ref 5–15)
BUN: 16 mg/dL (ref 8–23)
CO2: 26 mmol/L (ref 22–32)
Calcium: 8.1 mg/dL — ABNORMAL LOW (ref 8.9–10.3)
Chloride: 100 mmol/L (ref 98–111)
Creatinine, Ser: 0.76 mg/dL (ref 0.44–1.00)
GFR, Estimated: >60 mL/min (ref >60)
Glucose, Bld: 109 mg/dL — ABNORMAL HIGH (ref 70–99)
Potassium: 4.4 mmol/L (ref 3.5–5.1)
Sodium: 137 mmol/L (ref 135–145)
Total Bilirubin: 0.8 mg/dL (ref 0.3–1.2)
Total Protein: 5.6 g/dL — ABNORMAL LOW (ref 6.5–8.1)

## 2020-07-21 LAB — MAGNESIUM: Magnesium: 2.3 mg/dL (ref 1.7–2.4)

## 2020-07-21 LAB — PHOSPHORUS: Phosphorus: 2.8 mg/dL (ref 2.5–4.6)

## 2020-07-21 NOTE — Progress Notes (Signed)
Physical Therapy Treatment Patient Details Name: Brandi Raymond MRN: [id number] DOB: 06/27/1934 Today's Date: 07/21/2020    History of Present Illness The pt is an 87 yo female presenting s/p hemi arthroplasty of L hip, anterior approach, to repair L hip fx sustained in a fall at home. PMH includes: arthritis, CAD, COPD on 3L O2 at baseline, GERD, HTN, HLD, and memory loss.    PT Comments    Patient received in bed, agitated, wants to get up, wants to get out of here. Husband present. Patient agrees to work with PT. She required min assist for bed mobility and transfers. Poor balance with ambulation requiring min assist to maintain safety. Patient will continue to benefit from skilled PT while here to improve safety, functional independence and strength.     Follow Up Recommendations  SNF;Supervision/Assistance - 24 hour     Equipment Recommendations  None recommended by PT    Recommendations for Other Services       Precautions / Restrictions Precautions Precautions: Anterior Hip;Fall Restrictions Weight Bearing Restrictions: No LLE Weight Bearing: Weight bearing as tolerated    Mobility  Bed Mobility Overal bed mobility: Needs Assistance Bed Mobility: Supine to Sit     Supine to sit: HOB elevated Sit to supine: Min assist   General bed mobility comments: improved ability this day, but requires min assist to raise trunk and get scooted to edge of bed.  Transfers Overall transfer level: Needs assistance Equipment used: Rolling walker (2 wheeled) Transfers: Sit to/from Stand Sit to Stand: Min assist         General transfer comment: Cues for hand placement, safety, balance  Ambulation/Gait Ambulation/Gait assistance: Min assist Gait Distance (Feet): 125 Feet Assistive device: Rolling walker (2 wheeled) Gait Pattern/deviations: Step-through pattern;Decreased step length - right;Decreased step length - left;Staggering left;Staggering right;Trunk flexed Gait  velocity: decreased   General Gait Details: patient is unsteady with mobility. Running walker into door jams, poor balance.   Stairs             Wheelchair Mobility    Modified Rankin (Stroke Patients Only)       Balance Overall balance assessment: Needs assistance Sitting-balance support: Feet supported Sitting balance-Leahy Scale: Good     Standing balance support: Bilateral upper extremity supported;During functional activity Standing balance-Leahy Scale: Poor Standing balance comment: reliant on UE support and external assist for fall prevention                            Cognition Arousal/Alertness: Awake/alert Behavior During Therapy: Agitated Overall Cognitive Status: History of cognitive impairments - at baseline                                 General Comments: patient agitated this morning, wants to leave. Husband present in room.      Exercises      General Comments General comments (skin integrity, edema, etc.): patient began on 2 lpm O2, bumped up to 3 lpm due to SOB with ambulation. Unable to get reading on pulse Ox.      Pertinent Vitals/Pain Pain Assessment: Faces Faces Pain Scale: Hurts a little bit Pain Location: L hip Pain Descriptors / Indicators: Sore;Discomfort Pain Intervention(s): Monitored during session    Home Living                      Prior Function  PT Goals (current goals can now be found in the care plan section) Acute Rehab PT Goals Patient Stated Goal: walk with rollator, go back home after rehab PT Goal Formulation: With patient Time For Goal Achievement: 07/31/20 Potential to Achieve Goals: Good Progress towards PT goals: Progressing toward goals    Frequency    Min 3X/week      PT Plan Current plan remains appropriate    Co-evaluation              AM-PAC PT "6 Clicks" Mobility   Outcome Measure  Help needed turning from your back to your side while  in a flat bed without using bedrails?: A Little Help needed moving from lying on your back to sitting on the side of a flat bed without using bedrails?: A Little Help needed moving to and from a bed to a chair (including a wheelchair)?: A Little Help needed standing up from a chair using your arms (e.g., wheelchair or bedside chair)?: A Little Help needed to walk in hospital room?: A Lot Help needed climbing 3-5 steps with a railing? : Total 6 Click Score: 15    End of Session Equipment Utilized During Treatment: Gait belt;Oxygen Activity Tolerance: Patient limited by fatigue Patient left: in chair;with call bell/phone within reach;with chair alarm set;with family/visitor present Nurse Communication: Mobility status PT Visit Diagnosis: Unsteadiness on feet (R26.81);Other abnormalities of gait and mobility (R26.89);Muscle weakness (generalized) (M62.81);Pain;History of falling (Z91.81) Pain - Right/Left: Left Pain - part of body: Hip     Time: 1040-1109 PT Time Calculation (min) (ACUTE ONLY): 29 min  Charges:  $Gait Training: 8-22 mins $Therapeutic Activity: 8-22 mins                     [employer], PT, [employer] 07/21/20,12:28 PM

## 2020-07-21 NOTE — TOC Progression Note (Addendum)
Transition of Care [facility]) - Progression Note    Patient Details  Name: Brandi Raymond MRN: [id number] Date of Birth: 1934/02/12  [facility] CM/SW Contact  Sharlet Salina Mila Homer, LCSW Phone Number: 07/21/2020, 5:35 PM  Clinical Narrative:  Received call from daughter Sharel Behne [phone]) and was provided with facility responses. Clinicals also sent to [facility] as daughter asked about this facility (CSW error in this facility not receiving patient's information). CSW will follow-up with Ms. Hard on Thursday regarding response from [facility] and her facility decision..  Requested clinicals submitted to [place] MUST for PASRR number.    Expected Discharge Plan: Skilled Nursing Facility Barriers to Discharge: Other (comment)  Expected Discharge Plan and Services Expected Discharge Plan: [place] In-house Referral: Clinical Social Work                                           Social Determinants of Health (SDOH) Interventions  No SDOH interventions requested or needed at this time   Readmission Risk Interventions Readmission Risk Prevention Plan 01/12/2020  PCP or Specialist Appt within 3-5 Days Complete  HRI or [place] Complete  Social Work Consult for [place] Planning/Counseling Complete  Palliative Care Screening Not Applicable  Medication Review Press photographer) Complete  Some recent data might be hidden

## 2020-07-21 NOTE — Progress Notes (Signed)
PROGRESS NOTE    Brandi Raymond  XBD:[id number] DOB: 1933-07-19 DOA: 07/15/2020 PCP: Binnie Rail, MD   Chief Complaint  Patient presents with  . Fall    Brief Narrative:  Brandi Raymond is a 87 y.o. female with medical history significant of a fib, COPD, SCC right lung s/p XRT, GERD, Dementia, HTN, CAD, memory loss. She has multiple vertebral compressiosn fractrures and was scheduled for kyphoplasty. She was waiting for her grad-daughter to take to the doctor. She stood up, lost her balance and fell with subsequent pain and inability to bear weight. She comes to [facility] for evaluation.  ED Course: T 98.1  141/111  HR 81  RR 18, on O2. Cmet nl, Hgb 9.7 (chronic anemia). CXR NAD, emphysema, hiatal hernia. Xray left hip with impacted intertrochanteric fx. Pt seen by ortho and is for surgery 07/17/20. [facility] called to admit, clear for surgery and manage medical issues.   She's now s/p prosthetic replacement for femoral neck fracture on 07/17/2020.  Currently awaiting SNF placement.  She was also seen by neurosurgery for compression fx, they recommend conservative measures, and follow outpatient.  Assessment & Plan:   Principal Problem:   Displaced fracture of left femoral neck (HCC) Active Problems:   Hypothyroidism   HYPERLIPIDEMIA   Essential hypertension   COPD (chronic obstructive pulmonary disease) with emphysema (HCC)   Anemia   Chronic mesenteric ischemia (HCC)   GERD (gastroesophageal reflux disease)   Paroxysmal atrial fibrillation (HCC)   Closed left hip fracture ([place])  1. Left femoral neck fracture - after mechanical fall - s/p prosthetic replacement for femoral neck fracture 07/17/2020 - eliquis for dvt ppx - APAP, oxy, morphine prn - bowel regimen - WBAT LLE, follow up with Dr. Erlinda Hong 2 weeks post op [facility] team following for SNF admission.  Ambulated 125 feet with PT today.  # Post Operative Anemia  Iron Def Anemia: s/p 1 unit pRBC Stable, follow hemoglobin up to 9  today. Follow anemia panel - suggestive of IDA, B12 is borderline (supplement and follow MMA)  2. COPD - oxygen dependent on 3 L at home. Currently stable without bronchospasm Continue incruse ellipta, prn atrovent, brovana On chronic prednisone, continue   3. Compression fx's spine - active problem. Was for kyphoplasty - now delayed to #1 Compression fx of T12, L1, L3 on imaging Discussed with neurosurgery -> conservative measures for compression fx - brace for [place] OOB if needed, follow up outpatient in 1 month  4. Atrial Fibrillation with RVR  Rate controlled Follow with home PO metop Metop IV prn for sustained HR > 120 Continue eliquis.  Stable  # Acute Kidney Injury: baseline creatinine is around 1.  AKI resolved.  Holding lasix.   6. Hypothyroidism- continue home meds.  Follow TSH (wnl)  7. Memory loss - continue home meds.   8. Oncology - follows with Dr. Earlie Server for Hettinger right lung s/p xrt question of recurrence per grand-daughter. Currently stable.  # Hypotension: resolved, unclear if significantly high blood pressures documented last night or early this morning were truly accurate.  Needs to have manual blood pressure check to confirm if she has those again.  DVT prophylaxis: heparin  Code Status: dnr Family Communication: Discussed with patient spouse at bedside this morning Disposition:   Status is: Inpatient  Remains inpatient appropriate because:Inpatient level of care appropriate due to severity of illness   Dispo: The patient is from: Home  Anticipated d/c is to: SNF              Anticipated d/c date is: Pending SNF bed              Patient currently stable for DC to SNF pending bed  Consultants:   Ortho  neurosurgery  Procedures:   As above  Antimicrobials: Anti-infectives (From admission, onward)   Start     Dose/Rate Route Frequency Ordered Stop   07/17/20 2000  ceFAZolin (ANCEF) IVPB 2g/100 mL premix        2 g 200  mL/hr over 30 Minutes Intravenous Every 12 hours 07/17/20 1233 07/18/20 2341   07/17/20 0949  vancomycin (VANCOCIN) powder  Status:  Discontinued          As needed 07/17/20 0949 07/17/20 0949   07/16/20 0945  ceFAZolin (ANCEF) IVPB 2g/100 mL premix       Note to Pharmacy: Anesthesia to give preop   2 g 200 mL/hr over 30 Minutes Intravenous  Once 07/16/20 0941 07/16/20 1107        Subjective: Reports that she was in a bad mood this morning and turned away PT but now wants to call back PT.  Reports having a rough night but does not elaborate.  Passing flatus.  Unclear when last she had a BM.  Objective: Vitals:   07/20/20 2157 07/21/20 0700 07/21/20 0956 07/21/20 1501  BP: (!) 165/128 (!) 150/116  138/79  Pulse: 88 78  (!) 54  Resp: 18 18  17   Temp: (!) 97.5 F (36.4 C) (!) 97.4 F (36.3 C)  97.8 F (36.6 C)  TempSrc: Oral Oral  Oral  SpO2: (!) 83% 94% 95% 99%  Weight:      Height:        Intake/Output Summary (Last 24 hours) at 07/21/2020 1917 Last data filed at 07/21/2020 1300 Gross per 24 hour  Intake 540 ml  Output --  Net 540 ml   Filed Weights   07/15/20 1129  Weight: 61.2 kg    Examination:  General: Pleasant elderly female, moderately built and nourished sitting up comfortably in bed. Cardiovascular: S1 and S2 heard, RRR.  No JVD, murmurs or pedal edema. Lungs: Clear to auscultation.  No increased work of breathing. Abdomen: Soft, nontender, nondistended Neurological: Alert and oriented 3. Moves all extremities 4. Cranial nerves II through XII grossly intact. Skin: Warm and dry. No rashes or lesions. Extremities: Left hip postop dressing clean and dry without acute findings.   Data Reviewed: I have personally reviewed following labs and imaging studies  CBC: Recent Labs  Lab 07/15/20 1237 07/16/20 0241 07/17/20 0245 07/18/20 0151 07/18/20 1756 07/19/20 0453 07/20/20 0252 07/21/20 0218  WBC 8.5   < > 9.7 10.6*  --  11.2* 10.0 9.4  NEUTROABS 7.1   --   --   --   --   --   --   --   HGB 9.7*   < > 9.4* 7.6* 8.8* 8.4* 8.6* 9.0*  HCT 34.8*   < > 31.0* 24.5* 27.9* 27.7* 29.0* 30.7*  MCV 90.6   < > 87.8 86.9  --  87.4 88.7 89.5  PLT 191   < > 138* 137*  --  138* 142* 158   < > = values in this interval not displayed.    Basic Metabolic Panel: Recent Labs  Lab 07/16/20 1852 07/17/20 2048 07/18/20 0151 07/19/20 0453 07/20/20 0252 07/21/20 0218  NA 135  --  137  137 141 137  K 4.2  --  4.7 4.0 4.9 4.4  CL 98  --  104 103 105 100  CO2 23  --  25 23 28 26   GLUCOSE 128*  --  119* 123* 113* 109*  BUN 23  --  16 19 15 16   CREATININE 1.34*  --  1.01* 0.98 0.85 0.76  CALCIUM 7.2*  --  7.0* 7.2* 7.7* 8.1*  MG  --   --   --   --   --  2.3  PHOS  --  2.9 2.6  --   --  2.8    GFR: Estimated Creatinine Clearance: 45.2 mL/min (by C-G formula based on SCr of 0.76 mg/dL).  Liver Function Tests: Recent Labs  Lab 07/15/20 1237 07/16/20 1852 07/18/20 0151 07/20/20 0252 07/21/20 0218  AST 26 35 22 25 25   ALT 21 18 10 8 10   ALKPHOS 112 103 72 81 86  BILITOT 0.4 0.7 0.8 1.0 0.8  PROT 6.1* 5.9* 5.1* 5.3* 5.6*  ALBUMIN 3.2* 2.9* 2.4* 2.2* 2.2*    CBG: No results for input(s): GLUCAP in the last 168 hours.   Recent Results (from the past 240 hour(s))  Resp Panel by RT-PCR (Flu A&B, Covid) Nasopharyngeal Swab     Status: None   Collection Time: 07/15/20 12:45 PM   Specimen: Nasopharyngeal Swab; Nasopharyngeal(NP) swabs in vial transport medium  Result Value Ref Range Status   SARS Coronavirus 2 by RT PCR NEGATIVE NEGATIVE Final    Comment: (NOTE) SARS-CoV-2 target nucleic acids are NOT DETECTED.  The SARS-CoV-2 RNA is generally detectable in upper respiratory specimens during the acute phase of infection. The lowest concentration of SARS-CoV-2 viral copies this assay can detect is 138 copies/mL. A negative result does not preclude SARS-Cov-2 infection and should not be used as the sole basis for treatment or other patient  management decisions. A negative result may occur with  improper specimen collection/handling, submission of specimen other than nasopharyngeal swab, presence of viral mutation(s) within the areas targeted by this assay, and inadequate number of viral copies(<138 copies/mL). A negative result must be combined with clinical observations, patient history, and epidemiological information. The expected result is Negative.  Fact Sheet for Patients:  EntrepreneurPulse.com.au  Fact Sheet for Healthcare Providers:  IncredibleEmployment.be  This test is no t yet approved or cleared by the [place] FDA and  has been authorized for detection and/or diagnosis of SARS-CoV-2 by FDA under an Emergency Use Authorization (EUA). This EUA will remain  in effect (meaning this test can be used) for the duration of the COVID-19 declaration under Section 564(b)(1) of the Act, 21 U.S.C.section 360bbb-3(b)(1), unless the authorization is terminated  or revoked sooner.       Influenza A by PCR NEGATIVE NEGATIVE Final   Influenza B by PCR NEGATIVE NEGATIVE Final    Comment: (NOTE) The Xpert Xpress SARS-CoV-2/FLU/RSV plus assay is intended as an aid in the diagnosis of influenza from Nasopharyngeal swab specimens and should not be used as a sole basis for treatment. Nasal washings and aspirates are unacceptable for Xpert Xpress SARS-CoV-2/FLU/RSV testing.  Fact Sheet for Patients: EntrepreneurPulse.com.au  Fact Sheet for Healthcare Providers: IncredibleEmployment.be  This test is not yet approved or cleared by the [place] FDA and has been authorized for detection and/or diagnosis of SARS-CoV-2 by FDA under an Emergency Use Authorization (EUA). This EUA will remain in effect (meaning this test can be used) for the duration of the COVID-19 declaration under  Section 564(b)(1) of the Act, 21 U.S.C. section 360bbb-3(b)(1),  unless the authorization is terminated or revoked.  Performed at [facility], [place] [address] [place], [place] [address]   Surgical pcr screen     Status: None   Collection Time: 07/16/20  4:29 PM   Specimen: Nasal Mucosa; Nasal Swab  Result Value Ref Range Status   MRSA, PCR NEGATIVE NEGATIVE Final   Staphylococcus aureus NEGATIVE NEGATIVE Final    Comment: (NOTE) The Xpert SA Assay (FDA approved for NASAL specimens in patients 14 years of age and older), is one component of a comprehensive surveillance program. It is not intended to diagnose infection nor to guide or monitor treatment. Performed at [facility], [place] [address] [place],  [address]          Radiology Studies: No results found.      Scheduled Meds: . apixaban  2.5 mg Oral BID  . arformoterol  15 mcg Nebulization BID  . clonazePAM  0.5 mg Oral QHS  . docusate sodium  100 mg Oral BID  . donepezil  10 mg Oral QHS  . DULoxetine  60 mg Oral Daily  . ferrous sulfate  325 mg Oral Q breakfast  . gabapentin  300 mg Oral TID  . levothyroxine  125 mcg Oral QAC breakfast  . metoprolol tartrate  25 mg Oral Daily  . multivitamin with minerals  1 tablet Oral Daily  . pantoprazole  40 mg Oral BID  . pravastatin  40 mg Oral Daily  . predniSONE  10 mg Oral Q breakfast  . senna  1 tablet Oral BID  . umeclidinium bromide  1 puff Inhalation Daily  . vitamin B-12  1,000 mcg Oral Daily   Continuous Infusions: . sodium chloride Stopped (07/17/20 0531)  . methocarbamol (ROBAXIN) IV       LOS: 6 days    Time spent: over 30 min    Vernell Leep, MD [facility]   To contact the attending provider between 7A-7P or the covering provider during after hours 7P-7A, please log into the web site [email or web address] and access using universal [place] password for that web site. If you do not have the password, please call the hospital operator.  07/21/2020, 7:17 PM

## 2020-07-21 NOTE — Progress Notes (Signed)
Nutrition Follow-up  DOCUMENTATION CODES:   Not applicable  INTERVENTION:   -Continue MVI with minerals daily -D/c Ensure Enlive po BID, each supplement provides 350 kcal and 20 grams of protein -Continue liberalized diet of regular  NUTRITION DIAGNOSIS:   Increased nutrient needs related to post-op healing as evidenced by estimated needs.  Ongoing  GOAL:   Patient will meet greater than or equal to 90% of their needs  Progressing   MONITOR:   PO intake,Supplement acceptance,Labs,Weight trends,Skin,I & O's  REASON FOR ASSESSMENT:   Consult Assessment of nutrition requirement/status,Hip fracture protocol  ASSESSMENT:   Brandi Raymond is a 87 y.o. female with medical history significant of a fib, COPD, SCC right lung s/p XRT, GERD, Dementia, HTN, CAD, memory loss. She has multiple vertebral compressiosn fractrures and was scheduled for kyphoplasty. She was waiting for her grad-daughter to take to the doctor. She stood up, lost her balance and fell with subsequent pain and inability to bear weight. She comes to [facility] for evaluation.  1/1- s/p Prosthetic replacement for femoral neck fracture.  Reviewed I/O's: +240 ml x 24 hours and +4.2 L since admission  Spoke with pt and husband at bedside. Pt reports good appetite, consuming most of her meals here. She reports consuming 100% of breakfast. Noted documented meal completions 80-100%.   Pt reports good appetite PTA. She consumes 2-3 meals per day (Breakfast: cereal and egg; Lunch: sandwich; Dinner: meat, starch, and vegetable). Pt husband reports that there was a time in the distant past where pt did not eat well and lost weight, but appetite has since improved and pt has regained lost weight. Pt with generalized fat and muscle depletions, which are likely related to advanced age.  Discussed importance of good meal intake to promote healing.  Medications reviewed and include colace, ferrous sulfate, prednisone, and vitamin  B-12.   Per TOC note, plan for SNF placement at discharge.   Labs reviewed.   Diet Order:   Diet Order            Diet regular Room service appropriate? Yes; Fluid consistency: Thin  Diet effective now                 EDUCATION NEEDS:   No education needs have been identified at this time  Skin:  Skin Assessment: Skin Integrity Issues: Skin Integrity Issues:: Incisions Incisions: closed lt hip  Last BM:  07/16/20  Height:   Ht Readings from Last 1 Encounters:  07/15/20 5\' 4"  (1.626 m)    Weight:   Wt Readings from Last 1 Encounters:  07/15/20 61.2 kg    Ideal Body Weight:  54.5 kg  BMI:  Body mass index is 23.17 kg/m.  Estimated Nutritional Needs:   Kcal:  1650-1850  Protein:  75-90 grams  Fluid:  > 1.6 L    Brandi Raymond, RD, LDN, [place] Registered Dietitian II Certified Diabetes Care and Education Specialist Please refer to [facility] for RD and/or RD on-call/weekend/after hours pager

## 2020-07-22 ENCOUNTER — Other Ambulatory Visit: Payer: Self-pay | Admitting: Internal Medicine

## 2020-07-22 DIAGNOSIS — S72002A Fracture of unspecified part of neck of left femur, initial encounter for closed fracture: Secondary | ICD-10-CM | POA: Diagnosis not present

## 2020-07-22 LAB — CBC
HCT: 30.4 % — ABNORMAL LOW (ref 36.0–46.0)
Hemoglobin: 9.3 g/dL — ABNORMAL LOW (ref 12.0–15.0)
MCH: 27 pg (ref 26.0–34.0)
MCHC: 30.6 g/dL (ref 30.0–36.0)
MCV: 88.4 fL (ref 80.0–100.0)
Platelets: 206 10*3/uL (ref 150–400)
RBC: 3.44 MIL/uL — ABNORMAL LOW (ref 3.87–5.11)
RDW: 16.9 % — ABNORMAL HIGH (ref 11.5–15.5)
WBC: 8.9 10*3/uL (ref 4.0–10.5)
nRBC: 0.3 % — ABNORMAL HIGH (ref 0.0–0.2)

## 2020-07-22 LAB — SARS CORONAVIRUS 2 (TAT 6-24 HRS): SARS Coronavirus 2: NEGATIVE

## 2020-07-22 NOTE — Plan of Care (Signed)
  Problem: Pain Managment: Goal: General experience of comfort will improve Outcome: Progressing   Problem: Safety: Goal: Ability to remain free from injury will improve Outcome: Progressing   Problem: Skin Integrity: Goal: Risk for impaired skin integrity will decrease Outcome: Progressing   

## 2020-07-22 NOTE — Care Management Important Message (Signed)
Important Message  Patient Details  Name: Brandi Raymond MRN: [id number] Date of Birth: Dec 30, 1933   Medicare Important Message Given:  Yes     [name] 07/22/2020, 11:35 AM

## 2020-07-22 NOTE — Plan of Care (Signed)
  Problem: Activity: Goal: Risk for activity intolerance will decrease 07/22/2020 0910 by Mayme Genta, RN Outcome: Progressing 07/22/2020 0800 by Mayme Genta, RN Outcome: Progressing   Problem: Elimination: Goal: Will not experience complications related to bowel motility 07/22/2020 0910 by Mayme Genta, RN Outcome: Progressing 07/22/2020 0800 by Mayme Genta, RN Outcome: Progressing Goal: Will not experience complications related to urinary retention 07/22/2020 0910 by Mayme Genta, RN Outcome: Progressing 07/22/2020 0800 by Mayme Genta, RN Outcome: Progressing

## 2020-07-22 NOTE — Progress Notes (Addendum)
PROGRESS NOTE    Brandi Raymond  PPI:[id number] DOB: [birth year] DOA: 07/15/2020 PCP: Binnie Rail, MD   Chief Complaint  Patient presents with  . Fall    Brief Narrative:  Brandi Raymond is a 87 y.o. female with medical history significant of a fib, COPD, SCC right lung s/p XRT, GERD, Dementia, HTN, CAD, memory loss. She has multiple vertebral compressiosn fractrures and was scheduled for kyphoplasty. She was waiting for her grad-daughter to take to the doctor. She stood up, lost her balance and fell with subsequent pain and inability to bear weight. She comes to [facility] for evaluation.  ED Course: T 98.1  141/111  HR 81  RR 18, on O2. Cmet nl, Hgb 9.7 (chronic anemia). CXR NAD, emphysema, hiatal hernia. Xray left hip with impacted intertrochanteric fx. Pt seen by ortho and is for surgery 07/17/20. [facility] called to admit, clear for surgery and manage medical issues.   She's now s/p prosthetic replacement for femoral neck fracture on 07/17/2020.  Currently awaiting SNF placement.  She was also seen by neurosurgery for compression fx, they recommend conservative measures, and follow outpatient.  Patient continues to wait for SNF placement.  She is frustrated with the long wait.  TOC team asked me to order COVID-19 test which I did.  Assessment & Plan:   Principal Problem:   Displaced fracture of left femoral neck (HCC) Active Problems:   Hypothyroidism   HYPERLIPIDEMIA   Essential hypertension   COPD (chronic obstructive pulmonary disease) with emphysema (HCC)   Anemia   Chronic mesenteric ischemia (HCC)   GERD (gastroesophageal reflux disease)   Paroxysmal atrial fibrillation (HCC)   Closed left hip fracture ([place])  1. Left femoral neck fracture - after mechanical fall - s/p prosthetic replacement for femoral neck fracture 07/17/2020 - eliquis for dvt ppx - APAP, oxy, morphine prn - bowel regimen - WBAT LLE, follow up with Dr. Erlinda Hong 2 weeks post op [facility] team following for SNF  admission.  Ambulated 125 feet with PT on 1/5 Unfortunately patient has had a long wait for SNF placement.  She is 1 among several such patients in the hospital at this time-multifactorial due to recent [place] holidays with insurances being closed, delays in SNF taking patients, etc.  Try to advise patient regarding this but not much comfort.  States that she would rather go home but advised her that she would be at fall risk with worsening complications.  # Post Operative Anemia  Iron Def Anemia: s/p 1 unit pRBC Stable in the 9 g range now Follow anemia panel - suggestive of IDA, B12 is borderline (supplement and follow MMA)  2. COPD - oxygen dependent on 3 L at home. Currently stable without bronchospasm Continue incruse ellipta, prn atrovent, brovana On chronic prednisone, continue   3. Compression fx's spine - active problem. Was for kyphoplasty - now delayed to #1 Compression fx of T12, L1, L3 on imaging Prior [facility] MD discussed with neurosurgery -> conservative measures for compression fx - brace for comfort OOB if needed, follow up outpatient in 1 month  4. Atrial Fibrillation with RVR  Rate controlled Follow with home PO metop/metoprolol Metop IV prn for sustained HR > 120 Continue eliquis.  Stable  # Acute Kidney Injury: baseline creatinine is around 1.  AKI resolved.  Holding lasix.   6. Hypothyroidism- continue home meds.  Follow TSH (wnl)  7. Memory loss - continue home meds.   8. Oncology - follows with Dr.  Mohammed for [facility] right lung s/p xrt question of recurrence per grand-daughter. Currently stable.  # Hypotension: resolved, unclear if significantly high blood pressures documented last night or early this morning were truly accurate.  Needs to have manual blood pressure check to confirm if she has those again.  Essential hypertension: Controlled on metoprolol.  Hyperlipidemia: Continue statins.   DVT prophylaxis: heparin  Code Status: dnr Family  Communication: Discussed with patient spouse at bedside this morning Disposition:   Status is: Inpatient  Remains inpatient appropriate because:Inpatient level of care appropriate due to severity of illness   Dispo: The patient is from: Home              Anticipated d/c is to: SNF              Anticipated d/c date is: Pending SNF bed              Patient currently stable for DC to SNF pending bed  Consultants:   Ortho  neurosurgery  Procedures:   As above  Antimicrobials: Anti-infectives (From admission, onward)   Start     Dose/Rate Route Frequency Ordered Stop   07/17/20 2000  ceFAZolin (ANCEF) IVPB 2g/100 mL premix        2 g 200 mL/hr over 30 Minutes Intravenous Every 12 hours 07/17/20 1233 07/18/20 2341   07/17/20 0949  vancomycin (VANCOCIN) powder  Status:  Discontinued          As needed 07/17/20 0949 07/17/20 0949   07/16/20 0945  ceFAZolin (ANCEF) IVPB 2g/100 mL premix       Note to Pharmacy: Anesthesia to give preop   2 g 200 mL/hr over 30 Minutes Intravenous  Once 07/16/20 0941 07/16/20 1107        Subjective: Frustrated being in the hospital.  Upset that she was moved to a different room in the middle of the night because they wanted her previously room for "negative pressure".  Had a large BM this morning.  Tolerating diet.  Not much pain reported.  Did not sleep well last night.  As per RN, no acute issues noted.  Objective: Vitals:   07/22/20 0734 07/22/20 0800 07/22/20 1300 07/22/20 1325  BP:  114/72 109/86   Pulse: 98 (!) 102 92   Resp: 18 18 18    Temp:  97.6 F (36.4 C) (!) 97.5 F (36.4 C)   TempSrc:  Oral Oral   SpO2: 99% 99%  96%  Weight:      Height:        Intake/Output Summary (Last 24 hours) at 07/22/2020 1714 Last data filed at 07/21/2020 1800 Gross per 24 hour  Intake 180 ml  Output --  Net 180 ml   Filed Weights   07/15/20 1129  Weight: 61.2 kg    Examination:  General: Pleasant elderly female, moderately built and  nourished sitting up comfortably at edge of bed and had finished eating her breakfast. Cardiovascular: S1 and S2 heard, RRR.  No JVD, murmurs or pedal edema. Lungs: Clear to auscultation.  No increased work of breathing. Abdomen: Soft, nontender, nondistended Neurological: Alert and oriented.  No focal neurological deficits. Skin: Warm and dry. No rashes or lesions. Extremities: Left hip postop dressing clean and dry without acute findings.  Some postop bruising but that this probably to be expected   Data Reviewed: I have personally reviewed following labs and imaging studies  CBC: Recent Labs  Lab 07/18/20 0151 07/18/20 1756 07/19/20 0453 07/20/20 0252 07/21/20  3646 07/22/20 0150  WBC 10.6*  --  11.2* 10.0 9.4 8.9  HGB 7.6* 8.8* 8.4* 8.6* 9.0* 9.3*  HCT 24.5* 27.9* 27.7* 29.0* 30.7* 30.4*  MCV 86.9  --  87.4 88.7 89.5 88.4  PLT 137*  --  138* 142* 158 803    Basic Metabolic Panel: Recent Labs  Lab 07/16/20 1852 07/17/20 2048 07/18/20 0151 07/19/20 0453 07/20/20 0252 07/21/20 0218  NA 135  --  137 137 141 137  K 4.2  --  4.7 4.0 4.9 4.4  CL 98  --  104 103 105 100  CO2 23  --  25 23 28 26   GLUCOSE 128*  --  119* 123* 113* 109*  BUN 23  --  16 19 15 16   CREATININE 1.34*  --  1.01* 0.98 0.85 0.76  CALCIUM 7.2*  --  7.0* 7.2* 7.7* 8.1*  MG  --   --   --   --   --  2.3  PHOS  --  2.9 2.6  --   --  2.8    GFR: Estimated Creatinine Clearance: 45.2 mL/min (by C-G formula based on SCr of 0.76 mg/dL).  Liver Function Tests: Recent Labs  Lab 07/16/20 1852 07/18/20 0151 07/20/20 0252 07/21/20 0218  AST 35 22 25 25   ALT 18 10 8 10   ALKPHOS 103 72 81 86  BILITOT 0.7 0.8 1.0 0.8  PROT 5.9* 5.1* 5.3* 5.6*  ALBUMIN 2.9* 2.4* 2.2* 2.2*    CBG: No results for input(s): GLUCAP in the last 168 hours.   Recent Results (from the past 240 hour(s))  Resp Panel by RT-PCR (Flu A&B, Covid) Nasopharyngeal Swab     Status: None   Collection Time: 07/15/20 12:45 PM    Specimen: Nasopharyngeal Swab; Nasopharyngeal(NP) swabs in vial transport medium  Result Value Ref Range Status   SARS Coronavirus 2 by RT PCR NEGATIVE NEGATIVE Final    Comment: (NOTE) SARS-CoV-2 target nucleic acids are NOT DETECTED.  The SARS-CoV-2 RNA is generally detectable in upper respiratory specimens during the acute phase of infection. The lowest concentration of SARS-CoV-2 viral copies this assay can detect is 138 copies/mL. A negative result does not preclude SARS-Cov-2 infection and should not be used as the sole basis for treatment or other patient management decisions. A negative result may occur with  improper specimen collection/handling, submission of specimen other than nasopharyngeal swab, presence of viral mutation(s) within the areas targeted by this assay, and inadequate number of viral copies(<138 copies/mL). A negative result must be combined with clinical observations, patient history, and epidemiological information. The expected result is Negative.  Fact Sheet for Patients:  EntrepreneurPulse.com.au  Fact Sheet for Healthcare Providers:  IncredibleEmployment.be  This test is no t yet approved or cleared by the [place] FDA and  has been authorized for detection and/or diagnosis of SARS-CoV-2 by FDA under an Emergency Use Authorization (EUA). This EUA will remain  in effect (meaning this test can be used) for the duration of the COVID-19 declaration under Section 564(b)(1) of the Act, 21 U.S.C.section 360bbb-3(b)(1), unless the authorization is terminated  or revoked sooner.       Influenza A by PCR NEGATIVE NEGATIVE Final   Influenza B by PCR NEGATIVE NEGATIVE Final    Comment: (NOTE) The Xpert Xpress SARS-CoV-2/FLU/RSV plus assay is intended as an aid in the diagnosis of influenza from Nasopharyngeal swab specimens and should not be used as a sole basis for treatment. Nasal washings and aspirates are  unacceptable for Xpert Xpress SARS-CoV-2/FLU/RSV testing.  Fact Sheet for Patients: EntrepreneurPulse.com.au  Fact Sheet for Healthcare Providers: IncredibleEmployment.be  This test is not yet approved or cleared by the [place] FDA and has been authorized for detection and/or diagnosis of SARS-CoV-2 by FDA under an Emergency Use Authorization (EUA). This EUA will remain in effect (meaning this test can be used) for the duration of the COVID-19 declaration under Section 564(b)(1) of the Act, 21 U.S.C. section 360bbb-3(b)(1), unless the authorization is terminated or revoked.  Performed at [facility], [place] [address], [place], [place] [address]   Surgical pcr screen     Status: None   Collection Time: 07/16/20  4:29 PM   Specimen: Nasal Mucosa; Nasal Swab  Result Value Ref Range Status   MRSA, PCR NEGATIVE NEGATIVE Final   Staphylococcus aureus NEGATIVE NEGATIVE Final    Comment: (NOTE) The Xpert SA Assay (FDA approved for NASAL specimens in patients 5 years of age and older), is one component of a comprehensive surveillance program. It is not intended to diagnose infection nor to guide or monitor treatment. Performed at [facility], [place] [address] [place], [place] [address]          Radiology Studies: No results found.      Scheduled Meds: . apixaban  2.5 mg Oral BID  . arformoterol  15 mcg Nebulization BID  . clonazePAM  0.5 mg Oral QHS  . docusate sodium  100 mg Oral BID  . donepezil  10 mg Oral QHS  . DULoxetine  60 mg Oral Daily  . ferrous sulfate  325 mg Oral Q breakfast  . gabapentin  300 mg Oral TID  . levothyroxine  125 mcg Oral QAC breakfast  . metoprolol tartrate  25 mg Oral Daily  . multivitamin with minerals  1 tablet Oral Daily  . pantoprazole  40 mg Oral BID  . pravastatin  40 mg Oral Daily  . predniSONE  10 mg Oral Q breakfast  . senna  1 tablet Oral BID  . umeclidinium bromide  1  puff Inhalation Daily  . vitamin B-12  1,000 mcg Oral Daily   Continuous Infusions: . sodium chloride Stopped (07/17/20 0531)  . methocarbamol (ROBAXIN) IV       LOS: 7 days    Time spent: over 30 min    Vernell Leep, MD [facility]   To contact the attending provider between 7A-7P or the covering provider during after hours 7P-7A, please log into the web site [email or web address] and access using universal [place] password for that web site. If you do not have the password, please call the hospital operator.  07/22/2020, 5:14 PM

## 2020-07-22 NOTE — Plan of Care (Signed)

## 2020-07-22 NOTE — Progress Notes (Signed)
Occupational Therapy Treatment Patient Details Name: Brandi Raymond MRN: [id number] DOB: 10-30-1933 Today's Date: 07/22/2020    History of present illness The pt is an 87 yo female presenting s/p hemi arthroplasty of L hip, anterior approach, to repair L hip fx sustained in a fall at home. PMH includes: arthritis, CAD, COPD on 3L O2 at baseline, GERD, HTN, HLD, and memory loss.   OT comments  Patient with progress toward goals this date.  Bed mobility with min guard and HOB elevated, sit to stand with min guard from elevated surface.  Able to walk to toilet with RW and min A.  Hygiene with min guard when standing.  Able to wash hands at sink with setup and min guard,  Patient's primary complaint is an upset stomach, and L hip discomfort.  She was eating crackers, and asked for a ginger ale.  Patient is accepting of ST Rehab at a SNF, just wants to get out of the hospital.  OT will continue in the acute setting.    Follow Up Recommendations  SNF    Equipment Recommendations  3 in 1 bedside commode;Other (comment)    Recommendations for Other Services      Precautions / Restrictions Precautions Precautions: Anterior Hip;Fall Restrictions Weight Bearing Restrictions: No LLE Weight Bearing: Weight bearing as tolerated       Mobility Bed Mobility Overal bed mobility: Needs Assistance Bed Mobility: Supine to Sit     Supine to sit: HOB elevated Sit to supine: Min guard      Transfers Overall transfer level: Needs assistance Equipment used: Rolling walker (2 wheeled) Transfers: Sit to/from Stand Sit to Stand: Min guard;From elevated surface              Balance Overall balance assessment: Needs assistance Sitting-balance support: Feet supported Sitting balance-Leahy Scale: Fair     Standing balance support: Bilateral upper extremity supported;During functional activity Standing balance-Leahy Scale: Poor Standing balance comment: reliant on UE support and external  assist for fall prevention                           ADL either performed or assessed with clinical judgement   ADL Overall ADL's : Needs assistance/impaired                         Toilet Transfer: Minimal assistance;Ambulation;Regular Museum/gallery exhibitions officer and Hygiene: Min guard;Sit to/from stand                                                                                                   Pertinent Vitals/ Pain       Faces Pain Scale: Hurts little more Pain Location: upset stomach with nausea and dizziness.  L-hip to a lesser degree. Pain Descriptors / Indicators: Pressure;Nagging Pain Intervention(s): Monitored during session  Frequency  Min 2X/week        Progress Toward Goals  OT Goals(current goals can now be found in the care plan section)  Progress towards OT goals: Progressing toward goals  Acute Rehab OT Goals Patient Stated Goal: I'm ready to get out of here and start rehab. OT Goal Formulation: With patient Time For Goal Achievement: 08/02/20 Potential to Achieve Goals: Good  Plan Discharge plan remains appropriate    Co-evaluation                 AM-PAC OT "6 Clicks" Daily Activity     Outcome Measure   Help from another person eating meals?: A Little Help from another person taking care of personal grooming?: A Little Help from another person toileting, which includes using toliet, bedpan, or urinal?: A Little Help from another person bathing (including washing, rinsing, drying)?: A Lot Help from another person to put on and taking off regular upper body clothing?: A Little Help from another person to put on and taking off regular lower body clothing?: A Lot 6 Click Score: 16    End of Session Equipment Utilized During Treatment: Rolling walker;Oxygen  OT Visit Diagnosis:  Unsteadiness on feet (R26.81);Muscle weakness (generalized) (M62.81);Pain Pain - Right/Left: Left Pain - part of body: Hip   Activity Tolerance Patient tolerated treatment well   Patient Left in bed;with call bell/phone within reach;with bed alarm set   Nurse Communication          Time: 3953-2023 OT Time Calculation (min): 17 min  Charges: OT General Charges $OT Visit: 1 Visit OT Treatments $Self Care/Home Management : 8-22 mins  07/22/2020  Rich, OTR/L  Acute Rehabilitation Services  Office:  [place] 07/22/2020, 4:35 PM

## 2020-07-22 NOTE — Telephone Encounter (Signed)
Dr Erlinda Hong is out of town.  She is in hospitalist service and was cleared for d/c from [place] after two days post-op.  It appears she may have initially had some blood pressure issues which look to have resolved.  It looks like she is currently awaiting bed availability at snf.  It would be best to call the hospital/her nurse to have the doctor managing her medical problems to give her a call for a better understanding

## 2020-07-23 DIAGNOSIS — S72002A Fracture of unspecified part of neck of left femur, initial encounter for closed fracture: Secondary | ICD-10-CM | POA: Diagnosis not present

## 2020-07-23 MED ORDER — SENNA 8.6 MG PO TABS: 1.0000 | ORAL_TABLET | Freq: Two times a day (BID) | ORAL | Status: DC

## 2020-07-23 MED ORDER — FERROUS SULFATE 325 (65 FE) MG PO TABS: 325.0000 mg | ORAL_TABLET | Freq: Every day | ORAL | Status: AC

## 2020-07-23 MED ORDER — CYANOCOBALAMIN 1000 MCG PO TABS: 1000.0000 ug | ORAL_TABLET | Freq: Every day | ORAL | Status: DC

## 2020-07-23 MED ORDER — ACETAMINOPHEN 325 MG PO TABS: 325.0000 mg | ORAL_TABLET | Freq: Four times a day (QID) | ORAL | Status: AC | PRN

## 2020-07-23 MED ORDER — POLYETHYLENE GLYCOL 3350 17 G PO PACK: 17.0000 g | PACK | Freq: Every day | ORAL | Status: DC | PRN

## 2020-07-23 NOTE — Clinical Social Work Note (Signed)
  07/23/20  RE: Brandi Raymond  TO [facility] IT MAY CONCERN:  Please be advised that the above named patient has a primary diagnosis of dementia which supersedes any psychiatric diagnosis.

## 2020-07-23 NOTE — Plan of Care (Signed)

## 2020-07-23 NOTE — Plan of Care (Signed)

## 2020-07-23 NOTE — Progress Notes (Addendum)
Addendum  Discharge paperwork was completed 07/23/2020.  However SNF indicated that they will not be able to accept patient until tomorrow.  DC order canceled.  Patient to be briefly reassessed in the morning and new DC order to be placed at time of DC.  I discussed in detail with patient's daughter via phone, updated care and answered questions.  Vernell Leep, MD, [place], [facility]. [facility]  To contact the attending provider between 7A-7P or the covering provider during after hours 7P-7A, please log into the web site [email or web address] and access using universal [place] password for that web site. If you do not have the password, please call the hospital operator.

## 2020-07-23 NOTE — TOC Progression Note (Addendum)
Transition of Care [facility]) - Progression Note    Patient Details  Name: Brandi Raymond MRN: [id number] Date of Birth: 07-21-1933  [facility] CM/SW Contact  Sharlet Salina Mila Homer, LCSW Phone Number: 07/23/2020, 6:03 PM  Clinical Narrative:  Patient medically stable for discharge and will go to [place] on Saturday, 1/8 per request of [employer]. A 9 am PTAR pick-up is requested. Daughter Vadis Slabach - [phone] contacted and updated. Insurance authorization received for SNF for 7 days - auth [id number] and auth for ambulance transport - [id number].    Dementia primary note and 30-day note submitted to [place] MUST.      Expected Discharge Plan: Skilled Nursing Facility Barriers to Discharge: Barriers Resolved  Expected Discharge Plan and Services Expected Discharge Plan: [place] In-house Referral: Clinical Social Work       Expected Discharge Date: 07/23/20                                     Social Determinants of Health (SDOH) Interventions    Readmission Risk Interventions Readmission Risk Prevention Plan 01/12/2020  PCP or Specialist Appt within 3-5 Days Complete  HRI or [place] Complete  Social Work Consult for [place] Planning/Counseling Complete  Palliative Care Screening Not Applicable  Medication Review Press photographer) Complete  Some recent data might be hidden

## 2020-07-23 NOTE — Clinical Social Work Note (Addendum)
  07/23/20  RE: Brandi Raymond  To Whom It May Concern  Please be advised that the above-named patient will require a short-term nursing home stay - anticipated 30 days or less for rehabilitation and strengthening. The plan is for return home.

## 2020-07-23 NOTE — Progress Notes (Signed)
Physical Therapy Treatment Patient Details Name: Brandi Raymond MRN: [id number] DOB: 03-28-1934 Today's Date: 07/23/2020    History of Present Illness The pt is an 87 yo female presenting s/p hemi arthroplasty of L hip, anterior approach, to repair L hip fx sustained in a fall at home. PMH includes: arthritis, CAD, COPD on 3L O2 at baseline, GERD, HTN, HLD, and memory loss.    PT Comments    Pt up in chair on PTA arrival to room, agreeable to therapy session and with good participation and tolerance for mobility. Pt able to progress gait distance using RW and mostly minA to 85ft, but remains limited due to decreased safety awareness and needs manual assist to safely navigate assistive device around obstacles in room and while turning. Pt min guard for transfers/bed mobility with increased time to perform and cues needed for safety. Pt continues to benefit from skilled rehab in a post acute setting to maximize functional gains before returning home.   Follow Up Recommendations  SNF;Supervision/Assistance - 24 hour     Equipment Recommendations  None recommended by PT    Recommendations for Other Services       Precautions / Restrictions Precautions Precautions: Fall Precaution Comments: per PA and post-op note, pt does not have hip precautions Restrictions Weight Bearing Restrictions: Yes LLE Weight Bearing: Weight bearing as tolerated    Mobility  Bed Mobility Overal bed mobility: Needs Assistance Bed Mobility: Sit to Supine       Sit to supine: Min guard   General bed mobility comments: BLE assist only  Transfers Overall transfer level: Needs assistance Equipment used: Rolling walker (2 wheeled) Transfers: Sit to/from Stand Sit to Stand: Min guard         General transfer comment: from chair>RW and toilet<>RW and RW>bed  Ambulation/Gait Ambulation/Gait assistance: Min Web designer (Feet): 50 Feet (40ft, 60ft with seated break) Assistive device: Rolling  walker (2 wheeled) Gait Pattern/deviations: Step-through pattern;Decreased step length - right;Decreased step length - left;Staggering left;Staggering right;Trunk flexed Gait velocity: decreased   General Gait Details: pt having difficulty managing RW, at times holding at arm's length and at times leaning over front of RW; poor carryover of cues for safety and RW mgmt, needs manual assist when turning for safety; increased unsteadiness while turning   Stairs             Wheelchair Mobility    Modified Rankin (Stroke Patients Only)       Balance Overall balance assessment: Needs assistance Sitting-balance support: Feet supported Sitting balance-Leahy Scale: Fair     Standing balance support: Bilateral upper extremity supported;During functional activity Standing balance-Leahy Scale: Poor Standing balance comment: reliant on UE support and external assist for fall prevention                            Cognition Arousal/Alertness: Awake/alert Behavior During Therapy: WFL for tasks assessed/performed (easily frustrated) Overall Cognitive Status: History of cognitive impairments - at baseline                                 General Comments: slight decrease in safety awareness/technique with RW, but agreeable to cues/commands      Exercises Other Exercises Other Exercises: cues for ankle pumps, quad sets in supine, encouraged 3x10 daily for QS and ankle pumps hourly; pt refusing SCDs    General Comments General comments (skin  integrity, edema, etc.): poor pleth reading on both hands during mobility, only reading able to be obtained was 86% and therefore O2 increased to 4L on  during gait trial, replaced to 3L baseline once returned to bed. HR reading variable 110-120 bpm however again poor pleth reading and may be unreliable.      Pertinent Vitals/Pain Pain Assessment: 0-10 Pain Score: 2  Pain Location: L hip Pain Descriptors / Indicators:  Nagging Pain Intervention(s): Monitored during session;Repositioned    Home Living                      Prior Function            PT Goals (current goals can now be found in the care plan section) Acute Rehab PT Goals Patient Stated Goal: I'm ready to get out of here and start rehab. PT Goal Formulation: With patient Time For Goal Achievement: 07/31/20 Potential to Achieve Goals: Good Progress towards PT goals: Progressing toward goals    Frequency    Min 3X/week      PT Plan Current plan remains appropriate    Co-evaluation              AM-PAC PT "6 Clicks" Mobility   Outcome Measure  Help needed turning from your back to your side while in a flat bed without using bedrails?: A Little Help needed moving from lying on your back to sitting on the side of a flat bed without using bedrails?: A Little Help needed moving to and from a bed to a chair (including a wheelchair)?: A Little Help needed standing up from a chair using your arms (e.g., wheelchair or bedside chair)?: A Little Help needed to walk in hospital room?: A Little Help needed climbing 3-5 steps with a railing? : Total 6 Click Score: 16    End of Session Equipment Utilized During Treatment: Gait belt;Oxygen Activity Tolerance: Patient tolerated treatment well Patient left: in bed;with call bell/phone within reach;with bed alarm set (RN notified pt wants info on discharge plan) Nurse Communication: Mobility status PT Visit Diagnosis: Unsteadiness on feet (R26.81);Other abnormalities of gait and mobility (R26.89);Muscle weakness (generalized) (M62.81);Pain;History of falling (Z91.81) Pain - Right/Left: Left Pain - part of body: Hip     Time: 1324-4010 PT Time Calculation (min) (ACUTE ONLY): 27 min  Charges:  $Gait Training: 8-22 mins $Therapeutic Activity: 8-22 mins                     Shatoria Stooksbury P., PTA Acute Rehabilitation Services Pager: [phone] Office: [place] 07/23/2020, 4:24 PM

## 2020-07-23 NOTE — TOC Progression Note (Signed)
Transition of Care [facility]) - Progression Note    Patient Details  Name: Brandi Raymond MRN: [id number] Date of Birth: 1934-01-01  [facility] CM/SW Contact  Sharlet Salina Mila Homer, LCSW Phone Number: 07/23/2020, 9:14 AM  Clinical Narrative:  Talked with daughter Robin Petrakis on 1/6 regarding facility choice for patient and [facility] chosen. Spoke with Olivia Mackie, admissions director at [facility] and they can take patient. Contact made with [employer] and spoke with Darrow Bussing to initiate insurance authorization. CSW was asked about patient's ambulation status and information provided.     Expected Discharge Plan: Skilled Nursing Facility Barriers to Discharge: Other (comment)  Expected Discharge Plan and Services Expected Discharge Plan: [place] In-house Referral: Clinical Social Work                                           Social Determinants of Health (SDOH) Interventions  No SDOH interventions requested or needed at this time.  Readmission Risk Interventions Readmission Risk Prevention Plan 01/12/2020  PCP or Specialist Appt within 3-5 Days Complete  HRI or Home Care Consult Complete  Social Work Consult for [place] Planning/Counseling Complete  Palliative Care Screening Not Applicable  Medication Review Press photographer) Complete  Some recent data might be hidden

## 2020-07-23 NOTE — Discharge Summary (Addendum)
Physician Discharge Summary  Brandi Raymond EPP:[id number] DOB: 1934/05/31  PCP: Binnie Rail, MD  Admitted from: Home Discharged to: Home  Admit date: 07/15/2020 Discharge date: 07/23/2020  Recommendations for Outpatient Follow-up:    Follow-up Information    Leandrew Koyanagi, MD In 2 weeks.   Specialty: Orthopedic Surgery Why: For suture removal, For wound re-check Contact information: [place] [address] [phone]        Dawley, Pieter Partridge C, DO Follow up in 1 month(s).   Contact information: [address] [place] [place] [address] [phone]        Binnie Rail, MD. Schedule an appointment as soon as possible for a visit.   Specialty: Internal Medicine Why: Upon discharge from SNF. Contact information: [place] [address] [phone]        Lorretta Harp, MD .   Specialties: Cardiology, Radiology Contact information: [address] [place] [address] [phone]        [name], MD .   Specialty: Cardiology Contact information: [place] [address] [phone]        MD at SNF Follow up.   Why: To be seen in 2-3 days with repeat labs (CBC & BMP).               Home Health: N/A    Equipment/Devices: TBD at SNF    Discharge Condition: Improved and stable.   Code Status: DNR Diet recommendation:  Discharge Diet Orders (From admission, onward)    Start     Ordered   07/23/20 0000  Diet - low sodium heart healthy        07/23/20 1451           Discharge Diagnoses:  Principal Problem:   Displaced fracture of left femoral neck (HCC) Active Problems:   Hypothyroidism   HYPERLIPIDEMIA   Essential hypertension   COPD (chronic obstructive pulmonary disease) with emphysema (HCC)   Anemia   Chronic mesenteric ischemia (HCC)   GERD (gastroesophageal reflux disease)   Paroxysmal atrial fibrillation (HCC)   Closed left  hip fracture ([place])   Brief Summary: Brandi Raymond a 87 y.o.married femalewith medical history significant ofa fib, COPD, SCC right lung s/p XRT, GERD, Dementia, HTN, CAD, memory loss and reports chronic home oxygen use. She has multiple vertebral compressiosn fractrures and was scheduled for kyphoplasty. She was waiting for her grad-daughter to take to the doctor. She stood up, lost her balance and fell with subsequent pain and inability to bear weight. She came to [facility] for evaluation.  ED Course:T 98.1 141/111 HR 81 RR 18, on O2. Cmet nl, Hgb 9.7 (chronic anemia). CXR NAD, emphysema, hiatal hernia. Xray left hip with impacted intertrochanteric fx. Pt seen by ortho and planned for surgery 07/17/20. [facility] called to admit, clear for surgery and manage medical issues.   She's now s/p prosthetic replacement for femoral neck fracture on 07/17/2020. She was also seen by Neurosurgery for compression fx, they recommend conservative measures, and follow outpatient.   Assessment & Plan:  1. Left femoral neck fracture - after mechanical fall - s/p prosthetic replacement for femoral neck fracture 07/17/2020 -Continue prior home eliquis for dvt ppx - Pain reasonably controlled. - WBAT LLE, follow up with Dr. Erlinda Hong 2 weeks post op -After longer than expected weight, finally discharging to SNF today for short-term rehab. -Recommend checking vitamin D levels and bone density as outpatient   #  Post Operative Anemia  Iron Def Anemia: s/p 1 unit pRBC Stable in the 9 g range now Follow anemia panel - suggestive of IDA, B12 borderline (264).  MMA 378.  Continue iron and B12 supplements.  2. COPD - oxygen dependent on 3 L at home. Currently stable without bronchospasm Continue incruse ellipta, prn atrovent, brovana On chronic prednisone, continue   3. Compression fx's spine - active problem. Was for kyphoplasty - now delayed to #1 Compression fx of T12, L1, L3 on imaging Neurosurgery  consultation 07/16/2020 appreciated-> conservative measures for compression fx - brace for comfort OOB if needed, follow up outpatient in 1 month  4. Atrial Fibrillation with RVR  Rate controlled Follow with home PO metoprolol Continue eliquis.  Stable  # Acute Kidney Injury: baseline creatinine is around 1.  AKI resolved.    Held Lasix in the hospital, resume at discharge.  6. Hypothyroidism- continue home meds.  Follow TSH (wnl)  7. Memory loss - continue home meds. No behavioral abnormalities, agitation in the hospital.  8. Oncology - follows with Dr. Earlie Server for [facility] right lung s/p xrt question of recurrence per grand-daughter. Currently stable.  # Hypotension: resolved.  Essential hypertension: Controlled on metoprolol.  Hyperlipidemia: Continue statins.   Consultants:   Ortho  Neurosurgery  Procedures:   As above     Discharge Instructions  Discharge Instructions    (HEART FAILURE PATIENTS) Call MD:  Anytime you have any of the following symptoms: 1) 3 pound weight gain in 24 hours or 5 pounds in 1 week 2) shortness of breath, with or without a dry hacking cough 3) swelling in the hands, feet or stomach 4) if you have to sleep on extra pillows at night in order to breathe.   Complete by: As directed    Call MD for:  difficulty breathing, headache or visual disturbances   Complete by: As directed    Call MD for:  extreme fatigue   Complete by: As directed    Call MD for:  persistant dizziness or light-headedness   Complete by: As directed    Call MD for:  persistant nausea and vomiting   Complete by: As directed    Call MD for:  redness, tenderness, or signs of infection (pain, swelling, redness, odor or green/yellow discharge around incision site)   Complete by: As directed    Call MD for:  severe uncontrolled pain   Complete by: As directed    Call MD for:  temperature >100.4   Complete by: As directed    Diet - low sodium heart healthy    Complete by: As directed    Discharge instructions   Complete by: As directed    Total acetaminophen dose from all sources not to exceed 4 g/day.   Increase activity slowly   Complete by: As directed    No wound care   Complete by: As directed    Weight bearing as tolerated   Complete by: As directed        Medication List    TAKE these medications   acetaminophen 325 MG tablet Commonly known as: TYLENOL Take 1-2 tablets (325-650 mg total) by mouth every 6 (six) hours as needed for mild pain, moderate pain or fever. Total acetaminophen dose from all sources not to exceed 4 g/day. What changed:   medication strength  how much to take  when to take this  reasons to take this  additional instructions   Bevespi Aerosphere 9-4.8 MCG/ACT Aero Generic  drug: Glycopyrrolate-Formoterol Inhale 2 puffs into the lungs in the morning and at bedtime.   CALCIUM 1200+D3 PO Take 1 tablet by mouth at bedtime.   clonazePAM 0.5 MG tablet Commonly known as: KLONOPIN TAKE 1 TABLET BY MOUTH EVERYDAY AT BEDTIME What changed: See the new instructions.   cyanocobalamin 1000 MCG tablet Take 1 tablet (1,000 mcg total) by mouth daily. Start taking on: July 24, 2020   docusate sodium 100 MG capsule Commonly known as: COLACE Take 100 mg by mouth at bedtime.   donepezil 10 MG tablet Commonly known as: ARICEPT TAKE 1 TABLET BY MOUTH EVERYDAY AT BEDTIME What changed: See the new instructions.   DULoxetine 60 MG capsule Commonly known as: CYMBALTA TAKE 1 CAPSULE BY MOUTH EVERY DAY What changed: how much to take   Eliquis 2.5 MG Tabs tablet Generic drug: apixaban TAKE 1 TABLET BY MOUTH TWICE A DAY What changed: how much to take   ferrous sulfate 325 (65 FE) MG tablet Take 1 tablet (325 mg total) by mouth daily with breakfast. Start taking on: July 24, 2020   furosemide 40 MG tablet Commonly known as: LASIX Take 1 tablet (40 mg total) by mouth every other day.   gabapentin  300 MG capsule Commonly known as: NEURONTIN TAKE ONE CAPSULE TWICE DAILY AND 2 AT NIGHT = FOUR TOTAL DAILY. What changed: See the new instructions.   ipratropium 0.02 % nebulizer solution Commonly known as: ATROVENT Take 2.5 mLs (0.5 mg total) by nebulization every 6 (six) hours as needed for wheezing or shortness of breath.   levothyroxine 125 MCG tablet Commonly known as: SYNTHROID TAKE 1 TABLET (125 MCG TOTAL) BY MOUTH DAILY BEFORE BREAKFAST.   metoprolol tartrate 25 MG tablet Commonly known as: LOPRESSOR TAKE 1 TABLET BY MOUTH EVERY DAY   oxyCODONE-acetaminophen 5-325 MG tablet Commonly known as: Percocet Take 1-2 tablets by mouth every 8 (eight) hours as needed for severe pain.   pantoprazole 40 MG tablet Commonly known as: PROTONIX Take 1 tablet (40 mg total) by mouth 2 (two) times daily.   polyethylene glycol 17 g packet Commonly known as: MIRALAX / GLYCOLAX Take 17 g by mouth daily as needed for mild constipation.   potassium chloride 10 MEQ tablet Commonly known as: KLOR-CON Take 1 tablet 10 meq every other day with Lasix. What changed:   how much to take  how to take this  when to take this  additional instructions   pravastatin 40 MG tablet Commonly known as: PRAVACHOL TAKE 1 TABLET BY MOUTH DAILY. FOLLOW-UP APPT W/LABS ARE DUE MUST SEE PROVIDER FOR FUTURE REFILLS What changed: See the new instructions.   predniSONE 10 MG tablet Commonly known as: DELTASONE Take 1 tablet (10 mg total) by mouth daily with breakfast.   PRESERVISION AREDS 2 PO Take 1 capsule by mouth 2 (two) times daily.   Prolia 60 MG/ML Sosy injection Generic drug: denosumab Inject 60 mg into the skin every 6 (six) months.   senna 8.6 MG Tabs tablet Commonly known as: SENOKOT Take 1 tablet (8.6 mg total) by mouth 2 (two) times daily.      Allergies  Allergen Reactions  . Silver Sulfadiazine Other (See Comments)    lowers white blood count       Procedures/Studies: DG  Chest 1 View  Result Date: 07/15/2020 CLINICAL DATA:  Left hip fracture following a fall. EXAM: CHEST  1 VIEW COMPARISON:  06/28/2020 FINDINGS: Stable enlarged cardiac silhouette and tortuous and calcified thoracic aorta. Stable left  subclavian bipolar pacemaker and leads. Stable moderate-sized hiatal hernia. Decreased subsegmental atelectasis in the right mid to lower lung zone. The remainder of the lungs remain hyperexpanded with stable mild diffuse prominence of the interstitial markings. Left neck surgical clips. Cervical spine degenerative changes. L1 compression deformity without gross change. IMPRESSION: 1. No acute abnormality. 2. Decreased atelectasis on the right. 3. Stable cardiomegaly, changes of COPD and moderate-sized hiatal hernia. Electronically Signed   By: Claudie Revering M.D.   On: 07/15/2020 12:32   DG Lumbar Spine 2-3 Views  Result Date: 07/15/2020 CLINICAL DATA:  87 year old female with left hip deformity EXAM: LUMBAR SPINE - 2-3 VIEW COMPARISON:  06/30/2020 FINDINGS: Lumbar Spine: Osteopenia Lumbar vertebral elements maintain normal alignment without evidence of anterolisthesis, retrolisthesis, subluxation. Similar configuration of the compression fractures involving T12 superior endplate, L1, and L3 superior endplate when compared to the plain film of 06/30/2020. No new fracture line identified. Degenerative disc disease throughout the lumbar spine again noted, worst at the L5-S1 level with vacuum disc phenomenon and endplate changes. Facet disease is mild, worst at L5-S1. Aortic atherosclerosis.  Stent within the proximal SMA. IMPRESSION: No acute fracture or malalignment of the lumbar spine. Unchanged appearance of compression fracture of T12, L1, L3. Electronically Signed   By: Corrie Mckusick D.O.   On: 07/15/2020 12:30   CT Head Wo Contrast  Result Date: 07/15/2020 CLINICAL DATA:  Fall EXAM: CT HEAD WITHOUT CONTRAST CT CERVICAL SPINE WITHOUT CONTRAST TECHNIQUE: Multidetector CT  imaging of the head and cervical spine was performed following the standard protocol without intravenous contrast. Multiplanar CT image reconstructions of the cervical spine were also generated. COMPARISON:  None. FINDINGS: CT HEAD FINDINGS Brain: No evidence of acute infarction, hemorrhage, hydrocephalus, extra-axial collection or mass lesion/mass effect. Mild subcortical white matter and periventricular small vessel ischemic changes. Vascular: Intracranial atherosclerosis. Skull: Normal. Negative for fracture or focal lesion. Sinuses/Orbits: The visualized paranasal sinuses are essentially clear. Partial opacification of the right mastoid air cells. Left mastoid air cells are clear. Other: None. CT CERVICAL SPINE FINDINGS Motion degraded images. Alignment: Normal cervical lordosis. Skull base and vertebrae: No acute fracture. No primary bone lesion or focal pathologic process. Soft tissues and spinal canal: No prevertebral fluid or swelling. No visible canal hematoma. Disc levels: Very mild degenerative changes at C3-4. Spinal canal is patent. Upper chest: Visualized lung apices are notable for centrilobular emphysematous changes. Other: None. IMPRESSION: No evidence of acute intracranial abnormality. Mild small vessel ischemic changes. No evidence of traumatic injury to the cervical spine. Electronically Signed   By: Julian Hy M.D.   On: 07/15/2020 12:16   CT Cervical Spine Wo Contrast  Result Date: 07/15/2020 CLINICAL DATA:  Fall EXAM: CT HEAD WITHOUT CONTRAST CT CERVICAL SPINE WITHOUT CONTRAST TECHNIQUE: Multidetector CT imaging of the head and cervical spine was performed following the standard protocol without intravenous contrast. Multiplanar CT image reconstructions of the cervical spine were also generated. COMPARISON:  None. FINDINGS: CT HEAD FINDINGS Brain: No evidence of acute infarction, hemorrhage, hydrocephalus, extra-axial collection or mass lesion/mass effect. Mild subcortical white  matter and periventricular small vessel ischemic changes. Vascular: Intracranial atherosclerosis. Skull: Normal. Negative for fracture or focal lesion. Sinuses/Orbits: The visualized paranasal sinuses are essentially clear. Partial opacification of the right mastoid air cells. Left mastoid air cells are clear. Other: None. CT CERVICAL SPINE FINDINGS Motion degraded images. Alignment: Normal cervical lordosis. Skull base and vertebrae: No acute fracture. No primary bone lesion or focal pathologic process. Soft tissues and spinal  canal: No prevertebral fluid or swelling. No visible canal hematoma. Disc levels: Very mild degenerative changes at C3-4. Spinal canal is patent. Upper chest: Visualized lung apices are notable for centrilobular emphysematous changes. Other: None. IMPRESSION: No evidence of acute intracranial abnormality. Mild small vessel ischemic changes. No evidence of traumatic injury to the cervical spine. Electronically Signed   By: Julian Hy M.D.   On: 07/15/2020 12:16   DG Pelvis Portable  Result Date: 07/17/2020 CLINICAL DATA:  87 year old female status post hip surgery for femoral neck fracture. EXAM: PORTABLE PELVIS 1-2 VIEWS COMPARISON:  Intraoperative images 0 755 hours today. FINDINGS: Portable AP supine view at 1217 hours. Bipolar left hip arthroplasty hardware redemonstrated. The left leg is somewhat rotated, but hardware and alignment appear satisfactory. Regional postoperative soft tissue changes. No new No acute osseous abnormality identified. Negative visible bowel gas pattern. IMPRESSION: Left bipolar hip arthroplasty with no adverse features identified. Electronically Signed   By: Genevie Ann M.D.   On: 07/17/2020 12:37   DG C-Arm 1-60 Min  Result Date: 07/17/2020 CLINICAL DATA:  Left hip hemiarthroplasty EXAM: OPERATIVE LEFT HIP (WITH PELVIS IF PERFORMED) 2 VIEWS TECHNIQUE: Fluoroscopic spot image(s) were submitted for interpretation post-operatively. COMPARISON:  None.  FINDINGS: Changes of left hip replacement. No hardware or bony complicating feature. Normal AP alignment. IMPRESSION: Left hip replacement.  No visible complicating feature. Electronically Signed   By: Rolm Baptise M.D.   On: 07/17/2020 10:19   DG HIP OPERATIVE UNILAT W OR W/O PELVIS LEFT  Result Date: 07/17/2020 CLINICAL DATA:  Left hip hemiarthroplasty EXAM: OPERATIVE LEFT HIP (WITH PELVIS IF PERFORMED) 2 VIEWS TECHNIQUE: Fluoroscopic spot image(s) were submitted for interpretation post-operatively. COMPARISON:  None. FINDINGS: Changes of left hip replacement. No hardware or bony complicating feature. Normal AP alignment. IMPRESSION: Left hip replacement.  No visible complicating feature. Electronically Signed   By: Rolm Baptise M.D.   On: 07/17/2020 10:19   DG HIP UNILAT WITH PELVIS 2-3 VIEWS LEFT  Result Date: 07/15/2020 CLINICAL DATA:  Left hip pain and deformity following a fall. EXAM: DG HIP (WITH OR WITHOUT PELVIS) 2-3V LEFT COMPARISON:  11/07/2015 FINDINGS: Interval left femoral neck fracture with proximal displacement and varus angulation of the distal fragment. There is also lateral rotation of the femoral head fragment. No dislocation seen. Diffuse osteopenia. IMPRESSION: Interval left femoral neck fracture, as described above. Electronically Signed   By: Claudie Revering M.D.   On: 07/15/2020 12:35      Subjective: Patient seen this morning.  Appeared to be in better spirits.  Denied complaints.  No pain reported.  As per RN, no acute issues noted.  Discharge Exam:  Vitals:   07/22/20 1325 07/22/20 2000 07/23/20 0300 07/23/20 0802  BP:  (!) 155/105 (!) 124/94 131/75  Pulse:  97  84  Resp:  16 17 20   Temp:  97.7 F (36.5 C) (!) 97.5 F (36.4 C) 98.1 F (36.7 C)  TempSrc:  Oral Oral Oral  SpO2: 96%  100% 100%  Weight:      Height:        General: Pleasant elderly female, moderately built and nourished lying comfortably propped up in bed without distress.  Oral mucosa  moist. Cardiovascular: S1 and S2 heard, RRR.  No JVD, murmurs or pedal edema. Lungs:  Clear to auscultation.  No increased work of breathing. Abdomen:  Nondistended, soft and nontender.  No organomegaly or masses appreciated.  Normal bowel sounds heard. Neurological: Alert and oriented to person, place  and partly to time.  No focal neurological deficits. Skin: Warm and dry. No rashes or lesions. Extremities: Left hip postop dressing clean and dry without acute findings.  Some postop bruising but that this probably to be expected    The results of significant diagnostics from this hospitalization (including imaging, microbiology, ancillary and laboratory) are listed below for reference.     Microbiology: Recent Results (from the past 240 hour(s))  Resp Panel by RT-PCR (Flu A&B, Covid) Nasopharyngeal Swab     Status: None   Collection Time: 07/15/20 12:45 PM   Specimen: Nasopharyngeal Swab; Nasopharyngeal(NP) swabs in vial transport medium  Result Value Ref Range Status   SARS Coronavirus 2 by RT PCR NEGATIVE NEGATIVE Final    Comment: (NOTE) SARS-CoV-2 target nucleic acids are NOT DETECTED.  The SARS-CoV-2 RNA is generally detectable in upper respiratory specimens during the acute phase of infection. The lowest concentration of SARS-CoV-2 viral copies this assay can detect is 138 copies/mL. A negative result does not preclude SARS-Cov-2 infection and should not be used as the sole basis for treatment or other patient management decisions. A negative result may occur with  improper specimen collection/handling, submission of specimen other than nasopharyngeal swab, presence of viral mutation(s) within the areas targeted by this assay, and inadequate number of viral copies(<138 copies/mL). A negative result must be combined with clinical observations, patient history, and epidemiological information. The expected result is Negative.  Fact Sheet for Patients:   EntrepreneurPulse.com.au  Fact Sheet for Healthcare Providers:  IncredibleEmployment.be  This test is no t yet approved or cleared by the [place] FDA and  has been authorized for detection and/or diagnosis of SARS-CoV-2 by FDA under an Emergency Use Authorization (EUA). This EUA will remain  in effect (meaning this test can be used) for the duration of the COVID-19 declaration under Section 564(b)(1) of the Act, 21 U.S.C.section 360bbb-3(b)(1), unless the authorization is terminated  or revoked sooner.       Influenza A by PCR NEGATIVE NEGATIVE Final   Influenza B by PCR NEGATIVE NEGATIVE Final    Comment: (NOTE) The Xpert Xpress SARS-CoV-2/FLU/RSV plus assay is intended as an aid in the diagnosis of influenza from Nasopharyngeal swab specimens and should not be used as a sole basis for treatment. Nasal washings and aspirates are unacceptable for Xpert Xpress SARS-CoV-2/FLU/RSV testing.  Fact Sheet for Patients: EntrepreneurPulse.com.au  Fact Sheet for Healthcare Providers: IncredibleEmployment.be  This test is not yet approved or cleared by the [place] FDA and has been authorized for detection and/or diagnosis of SARS-CoV-2 by FDA under an Emergency Use Authorization (EUA). This EUA will remain in effect (meaning this test can be used) for the duration of the COVID-19 declaration under Section 564(b)(1) of the Act, 21 U.S.C. section 360bbb-3(b)(1), unless the authorization is terminated or revoked.  Performed at [facility], [place] [address], [place], [place] [address]   Surgical pcr screen     Status: None   Collection Time: 07/16/20  4:29 PM   Specimen: Nasal Mucosa; Nasal Swab  Result Value Ref Range Status   MRSA, PCR NEGATIVE NEGATIVE Final   Staphylococcus aureus NEGATIVE NEGATIVE Final    Comment: (NOTE) The Xpert SA Assay (FDA approved for NASAL specimens in patients  65 years of age and older), is one component of a comprehensive surveillance program. It is not intended to diagnose infection nor to guide or monitor treatment. Performed at [facility], [place] [address] [place], [place] [address]  SARS CORONAVIRUS 2 (TAT 6-24 HRS) Nasopharyngeal Nasopharyngeal Swab     Status: None   Collection Time: 07/22/20 12:07 PM   Specimen: Nasopharyngeal Swab  Result Value Ref Range Status   SARS Coronavirus 2 NEGATIVE NEGATIVE Final    Comment: (NOTE) SARS-CoV-2 target nucleic acids are NOT DETECTED.  The SARS-CoV-2 RNA is generally detectable in upper and lower respiratory specimens during the acute phase of infection. Negative results do not preclude SARS-CoV-2 infection, do not rule out co-infections with other pathogens, and should not be used as the sole basis for treatment or other patient management decisions. Negative results must be combined with clinical observations, patient history, and epidemiological information. The expected result is Negative.  Fact Sheet for Patients: SugarRoll.be  Fact Sheet for Healthcare Providers: https://www.woods-mathews.com/  This test is not yet approved or cleared by the [place] FDA and  has been authorized for detection and/or diagnosis of SARS-CoV-2 by FDA under an Emergency Use Authorization (EUA). This EUA will remain  in effect (meaning this test can be used) for the duration of the COVID-19 declaration under Se ction 564(b)(1) of the Act, 21 U.S.C. section 360bbb-3(b)(1), unless the authorization is terminated or revoked sooner.  Performed at [facility], [place] [address], [place], [place] [address]      Labs: CBC: Recent Labs  Lab 07/18/20 0151 07/18/20 1756 07/19/20 0453 07/20/20 0252 07/21/20 0218 07/22/20 0150  WBC 10.6*  --  11.2* 10.0 9.4 8.9  HGB 7.6* 8.8* 8.4* 8.6* 9.0* 9.3*  HCT 24.5* 27.9* 27.7* 29.0* 30.7* 30.4*  MCV  86.9  --  87.4 88.7 89.5 88.4  PLT 137*  --  138* 142* 158 709    Basic Metabolic Panel: Recent Labs  Lab 07/16/20 1852 07/17/20 2048 07/18/20 0151 07/19/20 0453 07/20/20 0252 07/21/20 0218  NA 135  --  137 137 141 137  K 4.2  --  4.7 4.0 4.9 4.4  CL 98  --  104 103 105 100  CO2 23  --  25 23 28 26   GLUCOSE 128*  --  119* 123* 113* 109*  BUN 23  --  16 19 15 16   CREATININE 1.34*  --  1.01* 0.98 0.85 0.76  CALCIUM 7.2*  --  7.0* 7.2* 7.7* 8.1*  MG  --   --   --   --   --  2.3  PHOS  --  2.9 2.6  --   --  2.8    Liver Function Tests: Recent Labs  Lab 07/16/20 1852 07/18/20 0151 07/20/20 0252 07/21/20 0218  AST 35 22 25 25   ALT 18 10 8 10   ALKPHOS 103 72 81 86  BILITOT 0.7 0.8 1.0 0.8  PROT 5.9* 5.1* 5.3* 5.6*  ALBUMIN 2.9* 2.4* 2.2* 2.2*      Time coordinating discharge: 35 minutes  SIGNED:  Vernell Leep, MD, FACP, [facility]. [facility]  To contact the attending provider between 7A-7P or the covering provider during after hours 7P-7A, please log into the web site [email or web address] and access using universal [place] password for that web site. If you do not have the password, please call the hospital operator.  Addendum: 07/24/2020 855 AM:  I have seen and examined patient . Reviewed discharge summary and med rec. No changes needed .

## 2020-07-24 DIAGNOSIS — Z4889 Encounter for other specified surgical aftercare: Secondary | ICD-10-CM | POA: Diagnosis not present

## 2020-07-24 DIAGNOSIS — M25552 Pain in left hip: Secondary | ICD-10-CM | POA: Diagnosis not present

## 2020-07-24 DIAGNOSIS — K219 Gastro-esophageal reflux disease without esophagitis: Secondary | ICD-10-CM | POA: Diagnosis not present

## 2020-07-24 DIAGNOSIS — E039 Hypothyroidism, unspecified: Secondary | ICD-10-CM | POA: Diagnosis not present

## 2020-07-24 DIAGNOSIS — R1310 Dysphagia, unspecified: Secondary | ICD-10-CM | POA: Diagnosis not present

## 2020-07-24 DIAGNOSIS — F0280 Dementia in other diseases classified elsewhere without behavioral disturbance: Secondary | ICD-10-CM | POA: Diagnosis not present

## 2020-07-24 DIAGNOSIS — E785 Hyperlipidemia, unspecified: Secondary | ICD-10-CM | POA: Diagnosis not present

## 2020-07-24 DIAGNOSIS — S22080A Wedge compression fracture of T11-T12 vertebra, initial encounter for closed fracture: Secondary | ICD-10-CM | POA: Diagnosis not present

## 2020-07-24 DIAGNOSIS — Z96642 Presence of left artificial hip joint: Secondary | ICD-10-CM | POA: Diagnosis not present

## 2020-07-24 DIAGNOSIS — C3491 Malignant neoplasm of unspecified part of right bronchus or lung: Secondary | ICD-10-CM | POA: Diagnosis not present

## 2020-07-24 DIAGNOSIS — S72142D Displaced intertrochanteric fracture of left femur, subsequent encounter for closed fracture with routine healing: Secondary | ICD-10-CM | POA: Diagnosis not present

## 2020-07-24 DIAGNOSIS — W19XXXD Unspecified fall, subsequent encounter: Secondary | ICD-10-CM | POA: Diagnosis not present

## 2020-07-24 DIAGNOSIS — D649 Anemia, unspecified: Secondary | ICD-10-CM | POA: Diagnosis not present

## 2020-07-24 DIAGNOSIS — Z4789 Encounter for other orthopedic aftercare: Secondary | ICD-10-CM | POA: Diagnosis not present

## 2020-07-24 DIAGNOSIS — S79929A Unspecified injury of unspecified thigh, initial encounter: Secondary | ICD-10-CM | POA: Diagnosis not present

## 2020-07-24 DIAGNOSIS — I10 Essential (primary) hypertension: Secondary | ICD-10-CM | POA: Diagnosis not present

## 2020-07-24 DIAGNOSIS — K5900 Constipation, unspecified: Secondary | ICD-10-CM | POA: Diagnosis not present

## 2020-07-24 DIAGNOSIS — K551 Chronic vascular disorders of intestine: Secondary | ICD-10-CM | POA: Diagnosis not present

## 2020-07-24 DIAGNOSIS — M2550 Pain in unspecified joint: Secondary | ICD-10-CM | POA: Diagnosis not present

## 2020-07-24 DIAGNOSIS — G309 Alzheimer's disease, unspecified: Secondary | ICD-10-CM | POA: Diagnosis not present

## 2020-07-24 DIAGNOSIS — Z7401 Bed confinement status: Secondary | ICD-10-CM | POA: Diagnosis not present

## 2020-07-24 DIAGNOSIS — I480 Paroxysmal atrial fibrillation: Secondary | ICD-10-CM | POA: Diagnosis not present

## 2020-07-24 DIAGNOSIS — S72002A Fracture of unspecified part of neck of left femur, initial encounter for closed fracture: Secondary | ICD-10-CM | POA: Diagnosis not present

## 2020-07-24 DIAGNOSIS — J449 Chronic obstructive pulmonary disease, unspecified: Secondary | ICD-10-CM | POA: Diagnosis not present

## 2020-07-24 DIAGNOSIS — R0602 Shortness of breath: Secondary | ICD-10-CM | POA: Diagnosis not present

## 2020-07-24 DIAGNOSIS — J439 Emphysema, unspecified: Secondary | ICD-10-CM | POA: Diagnosis not present

## 2020-07-24 DIAGNOSIS — Z79899 Other long term (current) drug therapy: Secondary | ICD-10-CM | POA: Diagnosis not present

## 2020-07-24 DIAGNOSIS — Z9981 Dependence on supplemental oxygen: Secondary | ICD-10-CM | POA: Diagnosis not present

## 2020-07-24 NOTE — Plan of Care (Signed)
  Problem: Nutrition: Goal: Adequate nutrition will be maintained Outcome: Progressing   Problem: Elimination: Goal: Will not experience complications related to bowel motility Outcome: Progressing Goal: Will not experience complications related to urinary retention Outcome: Progressing   Problem: Safety: Goal: Ability to remain free from injury will improve Outcome: Progressing   Problem: Pain Managment: Goal: General experience of comfort will improve Outcome: Progressing

## 2020-07-24 NOTE — Progress Notes (Signed)
Called [facility] and report given to nurse April. Discharge package printed and will send with patient.

## 2020-07-24 NOTE — Progress Notes (Signed)
Patient seen and examined. I interviewed her . Denies any overnight events. Denies pain.  Reviewed her discharge and med rec and no changes needed .  Physical Exam Constitutional:      Appearance: Normal appearance.  HENT:     Head: Normocephalic.  Pulmonary:     Effort: Pulmonary effort is normal.  Neurological:     General: No focal deficit present.     Mental Status: She is alert and oriented to person, place, and time.    Plan: stable to discharge to SNF today,   Total time spent :16 minutes

## 2020-07-24 NOTE — TOC Transition Note (Signed)
Transition of Care [facility]) - CM/SW Discharge Note   Patient Details  Name: Brandi Raymond MRN: [id number] Date of Birth: 12-27-1933  Transition of Care [facility]) CM/SW Contact:  Loreta Ave, [place] Phone Number: 07/24/2020, 8:57 AM   Clinical Narrative:    Patient will DC to: [facility] Anticipated DC date: 07/24/20 Family notified: Levi Aland Transport by: Corey Harold   Per MD patient ready for DC to [facility] . RN to call report prior to discharge [phone]. RN, patient, patient's family, and facility notified of DC. Discharge Summary and FL2 sent to facility. DC packet on chart. Ambulance transport requested for patient.   CSW will sign off for now as social work intervention is no longer needed. Please consult [place] again if new needs arise.     Final next level of care: [place] ([place]) Barriers to Discharge: Barriers Resolved   Patient Goals and CMS Choice Patient states their goals for this hospitalization and ongoing recovery are:: Daughter in agreement with SNF placement CMS Medicare.gov Compare Post Acute Care list provided to:: Patient Represenative (must comment) (Daughter informed about [email or web address]) Choice offered to / list presented to : Adult Children  Discharge Placement                       Discharge Plan and Services In-house Referral: Clinical Social Work                                   Social Determinants of Health (SDOH) Interventions     Readmission Risk Interventions Readmission Risk Prevention Plan 01/12/2020  PCP or Specialist Appt within 3-5 Days Complete  HRI or [place] Complete  Social Work Consult for [place] Planning/Counseling Complete  Palliative Care Screening Not Applicable  Medication Review Press photographer) Complete  Some recent data might be hidden

## 2020-07-25 DIAGNOSIS — K5900 Constipation, unspecified: Secondary | ICD-10-CM | POA: Diagnosis not present

## 2020-07-25 DIAGNOSIS — G309 Alzheimer's disease, unspecified: Secondary | ICD-10-CM | POA: Diagnosis not present

## 2020-07-25 DIAGNOSIS — D649 Anemia, unspecified: Secondary | ICD-10-CM | POA: Diagnosis not present

## 2020-07-25 DIAGNOSIS — Z96642 Presence of left artificial hip joint: Secondary | ICD-10-CM | POA: Diagnosis not present

## 2020-07-25 DIAGNOSIS — S22080A Wedge compression fracture of T11-T12 vertebra, initial encounter for closed fracture: Secondary | ICD-10-CM | POA: Diagnosis not present

## 2020-07-25 DIAGNOSIS — K551 Chronic vascular disorders of intestine: Secondary | ICD-10-CM | POA: Diagnosis not present

## 2020-07-25 DIAGNOSIS — J439 Emphysema, unspecified: Secondary | ICD-10-CM | POA: Diagnosis not present

## 2020-07-25 DIAGNOSIS — R1310 Dysphagia, unspecified: Secondary | ICD-10-CM | POA: Diagnosis not present

## 2020-07-26 ENCOUNTER — Other Ambulatory Visit: Payer: Self-pay | Admitting: Internal Medicine

## 2020-07-27 ENCOUNTER — Telehealth: Payer: Self-pay | Admitting: Internal Medicine

## 2020-07-27 NOTE — Telephone Encounter (Signed)
Rec'd call from daughter, [name], stating that she needed to cancel the In-home Palliative Consult scheduled for 07/28/20, patient is currently in [facility] facility due to a hip fracture.  Daughter wanted Palliative NP to see patient at facility.  Notified Palliative Team of above.

## 2020-07-28 ENCOUNTER — Other Ambulatory Visit: Payer: Self-pay | Admitting: *Deleted

## 2020-07-28 ENCOUNTER — Other Ambulatory Visit: Payer: Self-pay | Admitting: Hospice

## 2020-07-28 DIAGNOSIS — K551 Chronic vascular disorders of intestine: Secondary | ICD-10-CM

## 2020-07-28 DIAGNOSIS — I6523 Occlusion and stenosis of bilateral carotid arteries: Secondary | ICD-10-CM

## 2020-07-28 DIAGNOSIS — R0602 Shortness of breath: Secondary | ICD-10-CM | POA: Diagnosis not present

## 2020-07-29 ENCOUNTER — Encounter: Payer: Self-pay | Admitting: Internal Medicine

## 2020-07-30 ENCOUNTER — Telehealth: Payer: Self-pay | Admitting: Internal Medicine

## 2020-07-30 DIAGNOSIS — K219 Gastro-esophageal reflux disease without esophagitis: Secondary | ICD-10-CM | POA: Diagnosis not present

## 2020-07-30 DIAGNOSIS — M8008XD Age-related osteoporosis with current pathological fracture, vertebra(e), subsequent encounter for fracture with routine healing: Secondary | ICD-10-CM | POA: Diagnosis not present

## 2020-07-30 DIAGNOSIS — M5031 Other cervical disc degeneration,  high cervical region: Secondary | ICD-10-CM | POA: Diagnosis not present

## 2020-07-30 DIAGNOSIS — Z7901 Long term (current) use of anticoagulants: Secondary | ICD-10-CM | POA: Diagnosis not present

## 2020-07-30 DIAGNOSIS — I480 Paroxysmal atrial fibrillation: Secondary | ICD-10-CM | POA: Diagnosis not present

## 2020-07-30 DIAGNOSIS — I119 Hypertensive heart disease without heart failure: Secondary | ICD-10-CM | POA: Diagnosis not present

## 2020-07-30 DIAGNOSIS — G309 Alzheimer's disease, unspecified: Secondary | ICD-10-CM | POA: Diagnosis not present

## 2020-07-30 DIAGNOSIS — Z96642 Presence of left artificial hip joint: Secondary | ICD-10-CM | POA: Diagnosis not present

## 2020-07-30 DIAGNOSIS — F0280 Dementia in other diseases classified elsewhere without behavioral disturbance: Secondary | ICD-10-CM | POA: Diagnosis not present

## 2020-07-30 DIAGNOSIS — H3530 Unspecified macular degeneration: Secondary | ICD-10-CM | POA: Diagnosis not present

## 2020-07-30 DIAGNOSIS — Z87891 Personal history of nicotine dependence: Secondary | ICD-10-CM | POA: Diagnosis not present

## 2020-07-30 DIAGNOSIS — Z950 Presence of cardiac pacemaker: Secondary | ICD-10-CM | POA: Diagnosis not present

## 2020-07-30 DIAGNOSIS — E785 Hyperlipidemia, unspecified: Secondary | ICD-10-CM | POA: Diagnosis not present

## 2020-07-30 DIAGNOSIS — I2510 Atherosclerotic heart disease of native coronary artery without angina pectoris: Secondary | ICD-10-CM | POA: Diagnosis not present

## 2020-07-30 DIAGNOSIS — D500 Iron deficiency anemia secondary to blood loss (chronic): Secondary | ICD-10-CM | POA: Diagnosis not present

## 2020-07-30 DIAGNOSIS — E039 Hypothyroidism, unspecified: Secondary | ICD-10-CM | POA: Diagnosis not present

## 2020-07-30 DIAGNOSIS — M4316 Spondylolisthesis, lumbar region: Secondary | ICD-10-CM | POA: Diagnosis not present

## 2020-07-30 DIAGNOSIS — J432 Centrilobular emphysema: Secondary | ICD-10-CM | POA: Diagnosis not present

## 2020-07-30 DIAGNOSIS — M5137 Other intervertebral disc degeneration, lumbosacral region: Secondary | ICD-10-CM | POA: Diagnosis not present

## 2020-07-30 DIAGNOSIS — Z471 Aftercare following joint replacement surgery: Secondary | ICD-10-CM | POA: Diagnosis not present

## 2020-07-30 DIAGNOSIS — Z86010 Personal history of colonic polyps: Secondary | ICD-10-CM | POA: Diagnosis not present

## 2020-07-30 DIAGNOSIS — M47812 Spondylosis without myelopathy or radiculopathy, cervical region: Secondary | ICD-10-CM | POA: Diagnosis not present

## 2020-07-30 DIAGNOSIS — Z9181 History of falling: Secondary | ICD-10-CM | POA: Diagnosis not present

## 2020-07-30 DIAGNOSIS — K5900 Constipation, unspecified: Secondary | ICD-10-CM | POA: Diagnosis not present

## 2020-07-30 DIAGNOSIS — Z85118 Personal history of other malignant neoplasm of bronchus and lung: Secondary | ICD-10-CM | POA: Diagnosis not present

## 2020-07-30 DIAGNOSIS — M80052D Age-related osteoporosis with current pathological fracture, left femur, subsequent encounter for fracture with routine healing: Secondary | ICD-10-CM | POA: Diagnosis not present

## 2020-07-30 NOTE — Telephone Encounter (Signed)
ok 

## 2020-07-30 NOTE — Telephone Encounter (Signed)
[name] w/ [facility] is requesting verbal orders for continued PT 2w4 and 1w3 and an OT eval.    Okay to LVM: [phone]

## 2020-07-30 NOTE — Telephone Encounter (Signed)
Verbals given to [place] today.

## 2020-08-04 ENCOUNTER — Telehealth: Payer: Self-pay | Admitting: Internal Medicine

## 2020-08-04 NOTE — Telephone Encounter (Signed)
[name] from [facility] called  Reporting a fall on 08/01/2020  Patient was trying to reach her bedside commode without her walker and fell to the floor. Her daughter heard her and helped her get up from the floor. The patient is having mild lower back pain but chose to not seek medical treatment. Patient was able to do all of her PT today.   Best contact # [phone]

## 2020-08-05 ENCOUNTER — Other Ambulatory Visit: Payer: Self-pay | Admitting: *Deleted

## 2020-08-05 ENCOUNTER — Encounter: Payer: Self-pay | Admitting: Internal Medicine

## 2020-08-05 DIAGNOSIS — I6523 Occlusion and stenosis of bilateral carotid arteries: Secondary | ICD-10-CM

## 2020-08-05 DIAGNOSIS — G8929 Other chronic pain: Secondary | ICD-10-CM

## 2020-08-05 DIAGNOSIS — M545 Other chronic pain: Secondary | ICD-10-CM

## 2020-08-05 MED ORDER — CYANOCOBALAMIN 1000 MCG PO TABS
1000.0000 ug | ORAL_TABLET | Freq: Every day | ORAL | 3 refills | Status: AC
Start: 2020-08-05 — End: ?

## 2020-08-05 MED ORDER — CLONAZEPAM 0.5 MG PO TABS: 0.5000 mg | ORAL_TABLET | Freq: Every day | ORAL | 0 refills | Status: DC

## 2020-08-06 ENCOUNTER — Other Ambulatory Visit: Payer: Self-pay

## 2020-08-06 ENCOUNTER — Ambulatory Visit (INDEPENDENT_AMBULATORY_CARE_PROVIDER_SITE_OTHER): Payer: PPO

## 2020-08-06 ENCOUNTER — Ambulatory Visit (INDEPENDENT_AMBULATORY_CARE_PROVIDER_SITE_OTHER): Payer: PPO | Admitting: Orthopaedic Surgery

## 2020-08-06 ENCOUNTER — Encounter: Payer: Self-pay | Admitting: Orthopaedic Surgery

## 2020-08-06 DIAGNOSIS — S72002A Fracture of unspecified part of neck of left femur, initial encounter for closed fracture: Secondary | ICD-10-CM

## 2020-08-06 DIAGNOSIS — G8929 Other chronic pain: Secondary | ICD-10-CM | POA: Insufficient documentation

## 2020-08-06 MED ORDER — TRAMADOL HCL 50 MG PO TABS: 50.0000 mg | ORAL_TABLET | Freq: Three times a day (TID) | ORAL | 0 refills | Status: DC | PRN

## 2020-08-06 NOTE — Addendum Note (Signed)
Addended by: Binnie Rail on: 08/06/2020 08:16 AM   Modules accepted: Orders
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
## 2020-08-09 ENCOUNTER — Ambulatory Visit: Payer: PPO | Admitting: Surgery

## 2020-08-09 ENCOUNTER — Encounter (HOSPITAL_COMMUNITY): Payer: PPO

## 2020-08-09 NOTE — Patient Instructions (Addendum)
   Medications changes include :   Take cymbalta 40 mg daily for one week and then 20 mg daily for on week then stop it.   Your prescription(s) have been submitted to your pharmacy. Please take as directed and contact our office if you believe you are having problem(s) with the medication(s).

## 2020-08-09 NOTE — Progress Notes (Signed)
Subjective:    Patient ID: Brandi Raymond, female    DOB: Nov 08, 1933, 87 y.o.   MRN: [id number]  HPI The patient is here for follow up from the hospital.  She is here with her daughter.  Admitted 07/15/20 - 07/23/20 for displaced fracture of left femoral neck.  She stood up at home, lost her balance and fell with subsequent pain and inability to bear weight. Xray of hip in ED showed impacted intertrochanteric fx.   Ortho saw pt and surgery done on 1/1 - prosthetic replacement.   She was still dealing with vertebral fracture and neurosurgery saw her.  They advised conservative measure and outpatient follow up.     Left femoral neck fx: From mechanical fall S/p prosthetic replacement for femoral neck fx 1/1 continued on home eliquis for dvt ppx Pain controlled WBAT LLE  Discharged to SNF for rehab  Post operative anemia, iron def S/p 1 unit pRBC Stable - hgb 9 Iron def, low B12 Continue iron and B12 supplements  COPD: Oxygen dependent on 3 L at home Stable Continue inhalers, prednisone from home  Compression fx, spine: Was for kyphoplasty - delayed due to hip fx Has compression fx of T12, L1, L3 To see neurosurgery as an outpatient Brace for comfort  Afib w RVR Rate controlled continue home metoprolol, eliquis  AKI Baseline Cr at 1 AKI resolved - lasix held, restarted at d/c  Chronic medical problems stable.  No change in meds.  Most of her pain is left lower back.  It is intermittent.  Sitting here it is ok.  Changing positions makes the pain worse.  No pain with walking.  She was discharged on vicodin, but does not want to take it.  I did prescribe tramadol, which she has been taking in addition to tylenol.  She stopped taking the tylenol.  She took tramadol for pain last night.  She has not taken anything today.  She is seeing ortho for the left hip.  Her daughter will set up a follow up with neurosurgery.   The past few days she has had stomach issues.  It  initially was tar like color.  She has urgency and a large BM.  Her daughter stopped the B12 and iron supplementation and gave her diarrhea. Her stool are better. She is starting to get constipation. Her duagther restarted her stool softener.   She gets a stomach ache and will get sweats  Medications and allergies reviewed with patient and updated if appropriate.  Patient Active Problem List   Diagnosis Date Noted  . Chronic back pain 08/06/2020  . Displaced fracture of left femoral neck ([name]) 07/17/2020  . Closed left hip fracture ([name]) 07/15/2020  . Goals of care, counseling/discussion 06/16/2020  . Sore throat 06/16/2020  . Large hiatal hernia 04/07/2020  . Dysphagia 04/07/2020  . Pacemaker 03/16/2020  . Chronic anticoagulation 03/16/2020  . Encounter for medication review and counseling 03/15/2020  . Gastritis with hemorrhage 03/04/2020  . PNA (pneumonia) 02/12/2020  . Right lower lobe pneumonia 01/13/2020  . CAP (community acquired pneumonia) 01/11/2020  . Acute respiratory failure ([place]) 09/02/2019  . Acute diastolic CHF (congestive heart failure) ([place]) 09/02/2019  . Paroxysmal atrial fibrillation (HCC)   . Second degree Mobitz II AV block 08/21/2019  . Second degree AV block 08/08/2019  . SOB (shortness of breath) 08/05/2019  . Chest tightness 08/05/2019  . GERD (gastroesophageal reflux disease) 05/21/2019  . Chronic respiratory failure with hypoxia ([place]) 05/21/2019  .  Medication management 05/21/2019  . Stage I squamous cell carcinoma of right lung ([place]) 02/06/2019  . Constipation 09/11/2018  . COPD exacerbation ([place]) 09/04/2018  . Right lower lobe lung mass 09/04/2018  . Closed compression fracture of L1 lumbar vertebra, initial encounter ([place]) 09/04/2018  . Preoperative clearance 07/19/2018  . Lower back pain 06/25/2018  . Cubital tunnel syndrome on left 05/22/2018  . Dupuytren's contracture of both hands 05/10/2018  . Primary osteoarthritis of both knees 05/10/2018   . Difficulty urinating 05/10/2018  . Osteoporosis 06/14/2017  . Prediabetes 06/23/2015  . Depression 06/23/2015  . Carotid stenosis 10/12/2014  . Sleep disorder 04/09/2014  . Dizziness 01/20/2013  . Mesenteric artery stenosis ([place]) 01/13/2013  . Thoracic aneurysm without mention of rupture 11/04/2012  . Chronic mesenteric ischemia ([place]) 10/08/2012  . Memory deficit 06/20/2012  . Irritable bowel syndrome 06/20/2012  . Ocular myasthenia gravis ([place]) 06/20/2012  . PVD (peripheral vascular disease) ([place]) 06/20/2012  . Hereditary and idiopathic peripheral neuropathy 05/22/2012  . Syncope 08/28/2011  . Anemia 07/22/2010  . ABDOMINAL BRUIT 08/17/2009  . Hypothyroidism 05/26/2008  . VITAMIN D DEFICIENCY 05/26/2008  . CHOLELITHIASIS 12/06/2007  . DIVERTICULOSIS, COLON 12/05/2007  . HYPERLIPIDEMIA 08/19/2007  . COPD (chronic obstructive pulmonary disease) with emphysema ([place]) 08/19/2007  . CIGARETTE SMOKER 02/06/2007  . Essential hypertension 02/06/2007  . COLONIC POLYPS 11/21/2004    Current Outpatient Medications on File Prior to Visit  Medication Sig Dispense Refill  . acetaminophen (TYLENOL) 325 MG tablet Take 1-2 tablets (325-650 mg total) by mouth every 6 (six) hours as needed for mild pain, moderate pain or fever. Total acetaminophen dose from all sources not to exceed 4 g/day.    . Calcium-Magnesium-Vitamin D (CALCIUM 1200+D3 PO) Take 1 tablet by mouth at bedtime.     . clonazePAM (KLONOPIN) 0.5 MG tablet Take 1 tablet (0.5 mg total) by mouth at bedtime. 30 tablet 0  . cyanocobalamin 1000 MCG tablet Take 1 tablet (1,000 mcg total) by mouth daily. 90 tablet 3  . docusate sodium (COLACE) 100 MG capsule Take 100 mg by mouth at bedtime.     . donepezil (ARICEPT) 10 MG tablet TAKE 1 TABLET BY MOUTH EVERYDAY AT BEDTIME (Patient taking differently: Take 10 mg by mouth at bedtime.) 90 tablet 2  . DULoxetine (CYMBALTA) 60 MG capsule TAKE 1 CAPSULE BY MOUTH EVERY DAY (Patient taking  differently: Take 60 mg by mouth daily.) 90 capsule 0  . ELIQUIS 2.5 MG TABS tablet TAKE 1 TABLET BY MOUTH TWICE A DAY (Patient taking differently: Take 2.5 mg by mouth 2 (two) times daily.) 60 tablet 5  . ferrous sulfate 325 (65 FE) MG tablet Take 1 tablet (325 mg total) by mouth daily with breakfast.    . furosemide (LASIX) 40 MG tablet Take 1 tablet (40 mg total) by mouth every other day. 45 tablet 3  . gabapentin (NEURONTIN) 300 MG capsule TAKE ONE CAPSULE TWICE DAILY AND 2 AT NIGHT = FOUR TOTAL DAILY. (Patient taking differently: Take 300-600 mg by mouth See admin instructions. Take 1 tablet (300 mg totally) by mouth in the morning and noon; Take 2 tablets (600 mg totally) by mouth at bed time) 360 capsule 1  . Glycopyrrolate-Formoterol (BEVESPI AEROSPHERE) 9-4.8 MCG/ACT AERO Inhale 2 puffs into the lungs in the morning and at bedtime.    Marland Kitchen ipratropium (ATROVENT) 0.02 % nebulizer solution Take 2.5 mLs (0.5 mg total) by nebulization every 6 (six) hours as needed for wheezing or shortness of breath. 75 mL  11  . levothyroxine (SYNTHROID) 125 MCG tablet TAKE 1 TABLET (125 MCG TOTAL) BY MOUTH DAILY BEFORE BREAKFAST. 90 tablet 1  . metoprolol tartrate (LOPRESSOR) 25 MG tablet TAKE 1 TABLET BY MOUTH EVERY DAY (Patient taking differently: Take 25 mg by mouth daily.) 90 tablet 2  . Multiple Vitamins-Minerals (PRESERVISION AREDS 2 PO) Take 1 capsule by mouth 2 (two) times daily.    . pantoprazole (PROTONIX) 40 MG tablet Take 1 tablet (40 mg total) by mouth 2 (two) times daily. 180 tablet 4  . polyethylene glycol (MIRALAX / GLYCOLAX) 17 g packet Take 17 g by mouth daily as needed for mild constipation.    . potassium chloride (KLOR-CON) 10 MEQ tablet Take 1 tablet 10 meq every other day with Lasix. (Patient taking differently: Take 10 mEq by mouth daily. Takes with Lasix) 45 tablet 3  . pravastatin (PRAVACHOL) 40 MG tablet TAKE 1 TABLET BY MOUTH DAILY. FOLLOW-UP APPT W/LABS ARE DUE MUST SEE PROVIDER FOR  FUTURE REFILLS 90 tablet 0  . predniSONE (DELTASONE) 10 MG tablet Take 1 tablet (10 mg total) by mouth daily with breakfast. 30 tablet 3  . PROLIA 60 MG/ML SOSY injection Inject 60 mg into the skin every 6 (six) months.     . senna (SENOKOT) 8.6 MG TABS tablet Take 1 tablet (8.6 mg total) by mouth 2 (two) times daily.    . traMADol (ULTRAM) 50 MG tablet Take 1 tablet (50 mg total) by mouth every 8 (eight) hours as needed for severe pain. For chronic back pain 90 tablet 0   No current facility-administered medications on file prior to visit.    Past Medical History:  Diagnosis Date  . Adenomatous colon polyp   . Arthritis   . CAD (coronary artery disease)    PTCA of RCA   . Carotid artery occlusion   . Cervical spine fracture ([address])   . COPD (chronic obstructive pulmonary disease) ([place])   . Depression   . Diverticulosis   . Gastritis 09/15/1991  . GERD (gastroesophageal reflux disease) 09/15/1991   Dr Sharlett Iles  . Hiatal hernia 09/15/1991  . Hip fracture, left, closed, initial encounter ([place]) 07/16/2020  . Hip fracture, right ([place])   . HLD (hyperlipidemia)   . HTN (hypertension)   . Hyperplastic polyps of stomach 11/2007   colonoscopy  . Hypertension   . Hypothyroidism    affecting the left eye, proptosis  . Iron deficiency anemia   . Lung cancer ([place])    s/p XRT  . Macular degeneration of left eye   . Memory loss   . Mesenteric artery stenosis ([place])   . Myocardial infarction ([place]) 1993  . Ocular myasthenia gravis ([place])    Dr Jannifer Franklin  . Orthostatic hypotension 06/05/2013  . PONV (postoperative nausea and vomiting)   . Presence of permanent cardiac pacemaker 08/2019  . Strabismus    left eye  . Syncope 1998    Past Surgical History:  Procedure Laterality Date  . ABDOMINAL AORTAGRAM N/A 10/15/2012   Procedure: ABDOMINAL Maxcine Ham;  Surgeon: Serafina Mitchell, MD;  Location: [facility] CATH LAB;  Service: Cardiovascular;  Laterality: N/A;  . arm surgery Left    fx  . BALLOON  ANGIOPLASTY, ARTERY  1993  . CARDIAC CATHETERIZATION  1996   LAD 20/50, CFX OK, RCA 30 at prev PTCA site, EF with mild HK inferior wall  . CAROTID ANGIOGRAM N/A 10/28/2014   Procedure: CAROTID ANGIOGRAM;  Surgeon: Serafina Mitchell, MD;  Location[facility] CATH LAB;  Service: Cardiovascular;  Laterality: N/A;  . CAROTID ENDARTERECTOMY    . CATARACT EXTRACTION     bilateral  . COLONOSCOPY W/ POLYPECTOMY  2006   Adenomatous polyps  . ENDARTERECTOMY Left 01/14/2015   Procedure: LEFT CAROTID ENDARTERECTOMY ;  Surgeon: Serafina Mitchell, MD;  Location: [place];  Service: Vascular;  Laterality: Left;  . ENDARTERECTOMY Right 08/12/2015   Procedure: ENDARTERECTOMY CAROTID WITH PATCH ANGIOPLASTY;  Surgeon: Serafina Mitchell, MD;  Location: [place];  Service: Vascular;  Laterality: Right;  . EYE MUSCLE SURGERY Left 11/04/2015  . EYE SURGERY Bilateral May 2016   Eyelids  . FOOT SURGERY Left   . LEG SURGERY Left    laceration  . MIDDLE EAR SURGERY Left 1970  . PACEMAKER IMPLANT N/A 08/21/2019   Procedure: PACEMAKER IMPLANT;  Surgeon: [name], MD;  Location: [place] CV LAB;  Service: Cardiovascular;  Laterality: N/A;  . PERCUTANEOUS STENT INTERVENTION  12/03/2012   Procedure: PERCUTANEOUS STENT INTERVENTION;  Surgeon: Serafina Mitchell, MD;  Location: [facility] CATH LAB;  Service: Cardiovascular;;  sma stent x1  . PERIPHERAL VASCULAR BALLOON ANGIOPLASTY  07/22/2019   Procedure: PERIPHERAL VASCULAR BALLOON ANGIOPLASTY;  Surgeon: Serafina Mitchell, MD;  Location: [place] CV LAB;  Service: Cardiovascular;;  Superior mesenteric  . STRABISMUS SURGERY Left 10/28/2015   Procedure: REPAIR STRABISMUS LEFT EYE;  Surgeon: Lamonte Sakai, MD;  Location: [place];  Service: Ophthalmology;  Laterality: Left;  . Third-degree Maresa [place]  2003   [facility]-legs ,buttocks,arms  . TOTAL ABDOMINAL HYSTERECTOMY  1973   Dysfunctional menses  . TOTAL HIP ARTHROPLASTY Left 07/17/2020   Procedure: HEMI HIP ARTHROPLASTY ANTERIOR APPROACH;  Surgeon:  Leandrew Koyanagi, MD;  Location: [place];  Service: Orthopedics;  Laterality: Left;  . UPPER GI ENDOSCOPY      Dr Sharlett Iles  . VISCERAL ANGIOGRAM N/A 10/15/2012   Procedure: VISCERAL ANGIOGRAM;  Surgeon: Serafina Mitchell, MD;  Location: [facility] CATH LAB;  Service: Cardiovascular;  Laterality: N/A;  . VISCERAL ANGIOGRAM N/A 12/03/2012   Procedure: VISCERAL ANGIOGRAM;  Surgeon: Serafina Mitchell, MD;  Location: [facility] CATH LAB;  Service: Cardiovascular;  Laterality: N/A;  . VISCERAL ANGIOGRAM N/A 08/05/2013   Procedure: MESENTERIC ANGIOGRAM;  Surgeon: Serafina Mitchell, MD;  Location: [facility] CATH LAB;  Service: Cardiovascular;  Laterality: N/A;  . VISCERAL ANGIOGRAM N/A 10/28/2014   Procedure: VISCERAL ANGIOGRAM;  Surgeon: Serafina Mitchell, MD;  Location: [facility] CATH LAB;  Service: Cardiovascular;  Laterality: N/A;  . VISCERAL ANGIOGRAPHY N/A 07/22/2019   Procedure: MESENTERIC ANGIOGRAPHY;  Surgeon: Serafina Mitchell, MD;  Location: [place] CV LAB;  Service: Cardiovascular;  Laterality: N/A;    Social History   Socioeconomic History  . Marital status: Married    Spouse name: Not on file  . Number of children: 3  . Years of education: 9th  . Highest education level: Not on file  Occupational History  . Occupation: Retired  Tobacco Use  . Smoking status: Former Smoker    Packs/day: 1.00    Years: 60.00    Pack years: 60.00    Types: Cigarettes    Quit date: 07/2019    Years since quitting: 1.0  . Smokeless tobacco: Never Used  . Tobacco comment: Successfully quit  Vaping Use  . Vaping Use: Never used  Substance and Sexual Activity  . Alcohol use: No    Alcohol/week: 0.0 standard drinks  . Drug use: No  . Sexual activity: Not Currently  Other Topics Concern  .  Not on file  Social History Narrative   Patient is right handed.  Lives in [place] with husband.   Patient drinks 3-4 cups of caffeine daily.   Social Determinants of Health   Financial Resource Strain: Not on file  Food Insecurity: Not on  file  Transportation Needs: Not on file  Physical Activity: Not on file  Stress: Not on file  Social Connections: Not on file    Family History  Problem Relation Age of Onset  . Throat cancer Mother        ? thyroid cancer  . Cancer Mother   . Emphysema Father   . Diabetes Father   . Heart attack Father 46  . Colon cancer Brother   . Cerebral aneurysm Brother   . Hypothyroidism Sister        X74  . Cancer Brother        Ear  . Diabetes Paternal Grandmother   . Diabetes Paternal Grandfather   . Diabetes Maternal Aunt     Review of Systems  Constitutional: Positive for diaphoresis. Negative for chills and fever.  Respiratory: Positive for cough (mild) and shortness of breath (chronic). Negative for wheezing.   Cardiovascular: Negative for chest pain, palpitations and leg swelling.  Gastrointestinal: Positive for abdominal pain (intermittent), constipation and diarrhea.       Gerd controlled  Neurological: Negative for light-headedness and headaches.  Psychiatric/Behavioral: Positive for dysphoric mood. The patient is nervous/anxious.        Objective:   Vitals:   08/10/20 1300  BP: 112/78  Pulse: 89  Temp: 98.3 F (36.8 C)  SpO2: 96%   BP Readings from Last 3 Encounters:  08/10/20 112/78  07/24/20 130/74  06/30/20 118/68   Wt Readings from Last 3 Encounters:  07/15/20 135 lb (61.2 kg)  06/30/20 134 lb (60.8 kg)  06/28/20 135 lb 12.8 oz (61.6 kg)   Body mass index is 23.17 kg/m.   Physical Exam    Constitutional: Appears well-developed and well-nourished. No distress.  HENT:  Head: Normocephalic and atraumatic.  Neck: Neck supple. No tracheal deviation present. No thyromegaly present.  No cervical lymphadenopathy Cardiovascular: Normal rate, regular rhythm and normal heart sounds.   No murmur heard. No carotid bruit .  No edema Pulmonary/Chest: Effort normal.  Diffusely decreased breath sounds-chronic. No respiratory distress. No has no wheezes. No  rales. Abdomen: Soft, nontender Skin: Skin is warm and dry. Not diaphoretic.  Psychiatric: Normal mood and affect. Behavior is normal.      Assessment & Plan:    See Problem List for Assessment and Plan of chronic medical problems.    This visit occurred during the SARS-CoV-2 public health emergency.  Safety protocols were in place, including screening questions prior to the visit, additional usage of staff PPE, and extensive cleaning of exam room while observing appropriate contact time as indicated for disinfecting solutions.

## 2020-08-10 ENCOUNTER — Ambulatory Visit (INDEPENDENT_AMBULATORY_CARE_PROVIDER_SITE_OTHER): Payer: PPO | Admitting: Internal Medicine

## 2020-08-10 ENCOUNTER — Other Ambulatory Visit: Payer: Self-pay

## 2020-08-10 ENCOUNTER — Encounter: Payer: Self-pay | Admitting: Internal Medicine

## 2020-08-10 ENCOUNTER — Telehealth: Payer: Self-pay

## 2020-08-10 DIAGNOSIS — I10 Essential (primary) hypertension: Secondary | ICD-10-CM

## 2020-08-10 DIAGNOSIS — E039 Hypothyroidism, unspecified: Secondary | ICD-10-CM | POA: Diagnosis not present

## 2020-08-10 DIAGNOSIS — S22080D Wedge compression fracture of T11-T12 vertebra, subsequent encounter for fracture with routine healing: Secondary | ICD-10-CM | POA: Diagnosis not present

## 2020-08-10 DIAGNOSIS — F3289 Other specified depressive episodes: Secondary | ICD-10-CM

## 2020-08-10 DIAGNOSIS — G479 Sleep disorder, unspecified: Secondary | ICD-10-CM

## 2020-08-10 DIAGNOSIS — J441 Chronic obstructive pulmonary disease with (acute) exacerbation: Secondary | ICD-10-CM | POA: Diagnosis not present

## 2020-08-10 MED ORDER — DULOXETINE HCL 20 MG PO CPEP
ORAL_CAPSULE | ORAL | 0 refills | Status: DC
Start: 2020-08-10 — End: 2020-10-05

## 2020-08-10 MED ORDER — SERTRALINE HCL 50 MG PO TABS: 50.0000 mg | ORAL_TABLET | Freq: Every day | ORAL | 5 refills | Status: DC

## 2020-08-10 NOTE — Assessment & Plan Note (Signed)
Chronic Not ideally controlled-she does feel depressed Currently taking Cymbalta, which was added on for her neuropathy-not sure how much is helping with the neuropathy.  Her daughter states she does not complain much about her nerve pain Will taper off Cymbalta-40 mg daily for 1 week then 20 mg daily for 1 week then stop She was on sertraline in the past and we will try restarting this-start sertraline 50 mg daily once she is off Cymbalta We can increase this if needed

## 2020-08-10 NOTE — Assessment & Plan Note (Signed)
Chronic pain We will follow-up with neurosurgery-was going to have a kyphoplasty, but then she broke her hip so this was delayed Pain is improving Continue Tylenol as needed Continue tramadol as needed She feels both of the above medications do not work well so she will only take them as needed

## 2020-08-10 NOTE — Assessment & Plan Note (Signed)
Chronic Continue clonazepam 0.5 mg nightly

## 2020-08-10 NOTE — Assessment & Plan Note (Signed)
Chronic Lab Results  Component Value Date   TSH 0.820 07/16/2020   Recent TSH within normal limits Continue levothyroxine 125 mcg daily

## 2020-08-10 NOTE — Telephone Encounter (Signed)
Attempted to contact patient to reschedule Palliative Care consult appointment from 07/22/20. No answer left a message to return call.

## 2020-08-10 NOTE — Assessment & Plan Note (Signed)
Chronic Well controlled Continue Lasix 40 mg every other day, metoprolol 25 mg daily

## 2020-08-11 ENCOUNTER — Encounter: Payer: Self-pay | Admitting: Pulmonary Disease

## 2020-08-11 ENCOUNTER — Ambulatory Visit: Payer: PPO | Admitting: Pulmonary Disease

## 2020-08-11 VITALS — BP 136/64 | HR 102 | Temp 97.7°F | Ht 64.0 in | Wt 133.0 lb

## 2020-08-11 DIAGNOSIS — J449 Chronic obstructive pulmonary disease, unspecified: Secondary | ICD-10-CM

## 2020-08-11 NOTE — Progress Notes (Signed)
Brandi Raymond    [id number]    [birth year]  Primary Care Physician:Burns, Claudina Lick, MD  Referring Physician: Binnie Rail, MD [place],  [place] [id number]  Chief complaint: Follow-up for severe COPD  HPI: 87 year old with history of hypertension, coronary artery disease, recent diagnosis in March 2020 of stage Ia squamous cell cancer status post SBRT.  Referred here for evaluation of emphysema.  Severe COPD on PFTs.    Was hospitalized in June 2021 for COPD exacerbation, CAP.  Noted to have mild oropharyngeal dysphagia and was seen by speech therapy. In 2021 she multiple clinic visits for COPD exacerbations requiring rounds of antibiotics, prednisone Started on chronic prednisone of 10 mg in 2021 for recurrent exacerbations  Pets: No pets Occupation: Used to work in Primary school teacher and and [employer] Exposures: No known exposures, no mold, hot tub, Jacuzzi Smoking history: 15-pack-year smoker.  Quit smoking in early 2021 Travel history: No significant travel history Relevant family history: No significant family history of lung disease  Interim history: She has been evaluated by [facility] for kyphoplasty of the spine but in the interim got admitted on 07/15/2020 after a fall and hip fracture s/p hemi arthroplasty of L hip, anterior approach, to repair L hip fx sustained in a fall at home on 07/17/20 She tolerated general anesthesia without any pulmonary complication  Continues on bevespi, supplemental oxygen and prednisone of 10 mg  Outpatient Encounter Medications as of 08/11/2020  Medication Sig  . acetaminophen (TYLENOL) 325 MG tablet Take 1-2 tablets (325-650 mg total) by mouth every 6 (six) hours as needed for mild pain, moderate pain or fever. Total acetaminophen dose from all sources not to exceed 4 g/day.  . Calcium-Magnesium-Vitamin D (CALCIUM 1200+D3 PO) Take 1 tablet by mouth at bedtime.   . clonazePAM (KLONOPIN) 0.5 MG tablet Take 1 tablet (0.5  mg total) by mouth at bedtime.  . cyanocobalamin 1000 MCG tablet Take 1 tablet (1,000 mcg total) by mouth daily.  Marland Kitchen docusate sodium (COLACE) 100 MG capsule Take 100 mg by mouth at bedtime.   . donepezil (ARICEPT) 10 MG tablet TAKE 1 TABLET BY MOUTH EVERYDAY AT BEDTIME (Patient taking differently: Take 10 mg by mouth at bedtime.)  . DULoxetine (CYMBALTA) 20 MG capsule Take 40 mg daily for one week, then 20 mg daily for one week then stop  . ELIQUIS 2.5 MG TABS tablet TAKE 1 TABLET BY MOUTH TWICE A DAY (Patient taking differently: Take 2.5 mg by mouth 2 (two) times daily.)  . ferrous sulfate 325 (65 FE) MG tablet Take 1 tablet (325 mg total) by mouth daily with breakfast.  . furosemide (LASIX) 40 MG tablet Take 1 tablet (40 mg total) by mouth every other day.  . gabapentin (NEURONTIN) 300 MG capsule TAKE ONE CAPSULE TWICE DAILY AND 2 AT NIGHT = FOUR TOTAL DAILY. (Patient taking differently: Take 300-600 mg by mouth See admin instructions. Take 1 tablet (300 mg totally) by mouth in the morning and noon; Take 2 tablets (600 mg totally) by mouth at bed time)  . Glycopyrrolate-Formoterol (BEVESPI AEROSPHERE) 9-4.8 MCG/ACT AERO Inhale 2 puffs into the lungs in the morning and at bedtime.  Marland Kitchen ipratropium (ATROVENT) 0.02 % nebulizer solution Take 2.5 mLs (0.5 mg total) by nebulization every 6 (six) hours as needed for wheezing or shortness of breath.  . levothyroxine (SYNTHROID) 125 MCG tablet TAKE 1 TABLET (125 MCG TOTAL) BY MOUTH DAILY BEFORE BREAKFAST.  Marland Kitchen  metoprolol tartrate (LOPRESSOR) 25 MG tablet TAKE 1 TABLET BY MOUTH EVERY DAY (Patient taking differently: Take 25 mg by mouth daily.)  . Multiple Vitamins-Minerals (PRESERVISION AREDS 2 PO) Take 1 capsule by mouth 2 (two) times daily.  . pantoprazole (PROTONIX) 40 MG tablet Take 1 tablet (40 mg total) by mouth 2 (two) times daily.  . polyethylene glycol (MIRALAX / GLYCOLAX) 17 g packet Take 17 g by mouth daily as needed for mild constipation.  . potassium  chloride (KLOR-CON) 10 MEQ tablet Take 1 tablet 10 meq every other day with Lasix. (Patient taking differently: Take 10 mEq by mouth daily. Takes with Lasix)  . pravastatin (PRAVACHOL) 40 MG tablet TAKE 1 TABLET BY MOUTH DAILY. FOLLOW-UP APPT W/LABS ARE DUE MUST SEE PROVIDER FOR FUTURE REFILLS  . predniSONE (DELTASONE) 10 MG tablet Take 1 tablet (10 mg total) by mouth daily with breakfast.  . PROLIA 60 MG/ML SOSY injection Inject 60 mg into the skin every 6 (six) months.   . senna (SENOKOT) 8.6 MG TABS tablet Take 1 tablet (8.6 mg total) by mouth 2 (two) times daily.  . traMADol (ULTRAM) 50 MG tablet Take 1 tablet (50 mg total) by mouth every 8 (eight) hours as needed for severe pain. For chronic back pain  . sertraline (ZOLOFT) 50 MG tablet Take 1 tablet (50 mg total) by mouth daily. Start once off of cymbalta (Patient not taking: Reported on 08/11/2020)   No facility-administered encounter medications on file as of 08/11/2020.    Physical Exam: Blood pressure 136/64, pulse (!) 102, temperature 97.7 F (36.5 C), temperature source Skin, height 5\' 4"  (1.626 m), weight 133 lb (60.3 kg), SpO2 91 %. Gen:      No acute distress, frail, elderly HEENT:  EOMI, sclera anicteric Neck:     No masses; no thyromegaly Lungs:    Diminished breath sounds CV:         Regular rate and rhythm; no murmurs Abd:      + bowel sounds; soft, non-tender; no palpable masses, no distension Ext:    No edema; adequate peripheral perfusion Skin:      Warm and dry; no rash Neuro: alert and oriented x 3 Psych: normal mood and affect  Data Reviewed: Imaging: CT chest 09/03/2018- lobulated mass in the superior segment of the right lower lobe measuring 2 cm.  Atherosclerosis, emphysema.  PET scan 09/25/18-2 cm right lower lobe pulmonary nodule is hypermetabolic.  Scattered bilateral soft 8 mm ill-defined lung nodules with low uptake.  CT chest 05/21/2020-progressive right lower lobe consolidation, left lower lobe airspace  disease with groundglass, stable pulmonary nodules. I have reviewed the images personally.  PFTs: 05/21/2019 FVC 1.81 [73%], FEV1 0.95 [51%], F/F 53, TLC 4.61 [91%], DLCO 7.34 [39%] Moderate-severe obstruction with severe diffusion defect   Labs: CT-guided biopsy 10/14/2018-Poorly differentiated squamous cell cancer  CBC 09/17/2018-WBC 11.3, eos 1%, absolute eosinophil count 113  Assessment:  Severe COPD, GOLD D Has multiple recent exacerbations We will hold prednisone at 10 mg for long-term maintenance to help stabilize her symptoms.  Benefits outweigh the risks as it will help her stay out of the hospital with worsening COPD. Continue supplemental oxygen, bevespi and nebs as needed  Lung cancer status post radiation therapy Getting regular follow-up scans with oncology  Planned kyphoplasty She is at high risk given age, frailty, multiple COPD exacerbations However no absolute contraindications to surgery  Goals of care Discussed goals of care with patient and daughter They have confirmed that CODE  STATUS is DNR Palliative care referral is pending.  Plan/Recommendations: - Bevespi, supplemental oxygen - Keep prednisone at 10 mg a day - Palliative care referral  Marshell Garfinkel MD [facility] 08/11/2020, 10:46 AM  CC: Binnie Rail, MD

## 2020-08-11 NOTE — Patient Instructions (Signed)
Continue the bevespi, prednisone at 10 mg and supplemental oxygen Continue to work with physical therapy  Follow-up in 4 months.

## 2020-08-12 ENCOUNTER — Encounter: Payer: Self-pay | Admitting: Internal Medicine

## 2020-08-12 ENCOUNTER — Telehealth: Payer: Self-pay | Admitting: Internal Medicine

## 2020-08-12 NOTE — Telephone Encounter (Signed)
[name] from [facility] PT   Patient cancelled her PT appointment for 1.26.2022.   Best contact [phone]

## 2020-08-12 NOTE — Telephone Encounter (Signed)
error 

## 2020-08-13 ENCOUNTER — Other Ambulatory Visit: Payer: Self-pay | Admitting: Internal Medicine

## 2020-08-14 DIAGNOSIS — I5031 Acute diastolic (congestive) heart failure: Secondary | ICD-10-CM | POA: Diagnosis not present

## 2020-08-14 DIAGNOSIS — J9611 Chronic respiratory failure with hypoxia: Secondary | ICD-10-CM | POA: Diagnosis not present

## 2020-08-14 DIAGNOSIS — C3491 Malignant neoplasm of unspecified part of right bronchus or lung: Secondary | ICD-10-CM | POA: Diagnosis not present

## 2020-08-14 DIAGNOSIS — J439 Emphysema, unspecified: Secondary | ICD-10-CM | POA: Diagnosis not present

## 2020-08-14 DIAGNOSIS — J441 Chronic obstructive pulmonary disease with (acute) exacerbation: Secondary | ICD-10-CM | POA: Diagnosis not present

## 2020-08-19 ENCOUNTER — Telehealth: Payer: Self-pay | Admitting: Internal Medicine

## 2020-08-19 DIAGNOSIS — S32010A Wedge compression fracture of first lumbar vertebra, initial encounter for closed fracture: Secondary | ICD-10-CM | POA: Insufficient documentation

## 2020-08-19 NOTE — Telephone Encounter (Signed)
[facility] called and said that the patient is declining her second visit this week and is only wanting to do PT once a week.

## 2020-08-23 ENCOUNTER — Ambulatory Visit (INDEPENDENT_AMBULATORY_CARE_PROVIDER_SITE_OTHER): Payer: PPO

## 2020-08-23 DIAGNOSIS — I441 Atrioventricular block, second degree: Secondary | ICD-10-CM

## 2020-08-25 LAB — CUP PACEART REMOTE DEVICE CHECK
Battery Remaining Longevity: 124 mo
Battery Remaining Percentage: 95.5 %
Battery Voltage: 3.02 V
Brady Statistic AP VP Percent: 3.2 %
Brady Statistic AP VS Percent: 1.5 %
Brady Statistic AS VP Percent: 52 %
Brady Statistic AS VS Percent: 43 %
Brady Statistic RA Percent Paced: 4 %
Brady Statistic RV Percent Paced: 53 %
Date Time Interrogation Session: 20220207020013
Implantable Lead Implant Date: 20210204
Implantable Lead Implant Date: 20210204
Implantable Lead Location: 753859
Implantable Lead Location: 753860
Implantable Pulse Generator Implant Date: 20210204
Lead Channel Impedance Value: 380 Ohm
Lead Channel Impedance Value: 550 Ohm
Lead Channel Pacing Threshold Amplitude: 0.5 V
Lead Channel Pacing Threshold Amplitude: 0.5 V
Lead Channel Pacing Threshold Pulse Width: 0.4 ms
Lead Channel Pacing Threshold Pulse Width: 0.5 ms
Lead Channel Sensing Intrinsic Amplitude: 12 mV
Lead Channel Sensing Intrinsic Amplitude: 4.7 mV
Lead Channel Setting Pacing Amplitude: 0.75 V
Lead Channel Setting Pacing Amplitude: 1.5 V
Lead Channel Setting Pacing Pulse Width: 0.4 ms
Lead Channel Setting Sensing Sensitivity: 2.5 mV
Pulse Gen Model: 2272
Pulse Gen Serial Number: 9196859

## 2020-08-26 NOTE — Telephone Encounter (Signed)
   Per [facility] [place], patient declined OT services Missed home PT visit today, rescheduled for next week

## 2020-08-27 DIAGNOSIS — H353131 Nonexudative age-related macular degeneration, bilateral, early dry stage: Secondary | ICD-10-CM | POA: Diagnosis not present

## 2020-08-27 DIAGNOSIS — H0102A Squamous blepharitis right eye, upper and lower eyelids: Secondary | ICD-10-CM | POA: Diagnosis not present

## 2020-08-27 DIAGNOSIS — H532 Diplopia: Secondary | ICD-10-CM | POA: Diagnosis not present

## 2020-08-27 DIAGNOSIS — H0102B Squamous blepharitis left eye, upper and lower eyelids: Secondary | ICD-10-CM | POA: Diagnosis not present

## 2020-08-27 DIAGNOSIS — Z961 Presence of intraocular lens: Secondary | ICD-10-CM | POA: Diagnosis not present

## 2020-08-27 DIAGNOSIS — H18513 Endothelial corneal dystrophy, bilateral: Secondary | ICD-10-CM | POA: Diagnosis not present

## 2020-08-27 DIAGNOSIS — H02831 Dermatochalasis of right upper eyelid: Secondary | ICD-10-CM | POA: Diagnosis not present

## 2020-08-27 DIAGNOSIS — D492 Neoplasm of unspecified behavior of bone, soft tissue, and skin: Secondary | ICD-10-CM | POA: Diagnosis not present

## 2020-08-27 DIAGNOSIS — H02834 Dermatochalasis of left upper eyelid: Secondary | ICD-10-CM | POA: Diagnosis not present

## 2020-08-27 DIAGNOSIS — H02534 Eyelid retraction left upper eyelid: Secondary | ICD-10-CM | POA: Diagnosis not present

## 2020-08-29 DIAGNOSIS — M80052D Age-related osteoporosis with current pathological fracture, left femur, subsequent encounter for fracture with routine healing: Secondary | ICD-10-CM | POA: Diagnosis not present

## 2020-08-30 NOTE — Progress Notes (Signed)
Remote pacemaker transmission.   

## 2020-09-01 ENCOUNTER — Encounter: Payer: Self-pay | Admitting: Orthopaedic Surgery

## 2020-09-01 ENCOUNTER — Ambulatory Visit (INDEPENDENT_AMBULATORY_CARE_PROVIDER_SITE_OTHER): Payer: PPO | Admitting: Orthopaedic Surgery

## 2020-09-01 ENCOUNTER — Other Ambulatory Visit: Payer: Self-pay | Admitting: Internal Medicine

## 2020-09-01 DIAGNOSIS — M1711 Unilateral primary osteoarthritis, right knee: Secondary | ICD-10-CM | POA: Diagnosis not present

## 2020-09-01 DIAGNOSIS — K2100 Gastro-esophageal reflux disease with esophagitis, without bleeding: Secondary | ICD-10-CM

## 2020-09-01 MED ORDER — BUPIVACAINE HCL 0.25 % IJ SOLN
2.0000 mL | INTRAMUSCULAR | Status: AC | PRN
Start: 2020-09-01 — End: 2020-09-01
  Administered 2020-09-01: 2 mL via INTRA_ARTICULAR

## 2020-09-01 MED ORDER — LIDOCAINE HCL 1 % IJ SOLN
2.0000 mL | INTRAMUSCULAR | Status: AC | PRN
  Administered 2020-09-01: 2 mL

## 2020-09-01 MED ORDER — HYALURONAN 88 MG/4ML IX SOSY
88.0000 mg | PREFILLED_SYRINGE | INTRA_ARTICULAR | Status: AC | PRN
  Administered 2020-09-01: 88 mg via INTRA_ARTICULAR
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
## 2020-09-07 ENCOUNTER — Other Ambulatory Visit: Payer: Self-pay | Admitting: Internal Medicine

## 2020-09-08 ENCOUNTER — Telehealth: Payer: Self-pay | Admitting: Internal Medicine

## 2020-09-08 NOTE — Progress Notes (Signed)
  Chronic Care Management   Note  09/08/2020 Name: Brandi Raymond MRN: [id number] DOB: Nov 29, 1933  Brandi Raymond is a 87 y.o. year old female who is a primary care patient of Burns, Claudina Lick, MD. I reached out to [name] by phone today in response to a referral sent by [name] PCP, Binnie Rail, MD.   [name] was given information about Chronic Care Management services today including:  1. CCM service includes personalized support from designated clinical staff supervised by her physician, including individualized plan of care and coordination with other care providers 2. 24/7 contact phone numbers for assistance for urgent and routine care needs. 3. Service will only be billed when office clinical staff spend 20 minutes or more in a month to coordinate care. 4. Only one practitioner may furnish and bill the service in a calendar month. 5. The patient may stop CCM services at any time (effective at the end of the month) by phone call to the office staff.   Patient agreed to services and verbal consent obtained.   Follow up plan:   Carley Perdue [employer] Scheduler

## 2020-09-10 DIAGNOSIS — J441 Chronic obstructive pulmonary disease with (acute) exacerbation: Secondary | ICD-10-CM | POA: Diagnosis not present

## 2020-09-13 DIAGNOSIS — C3491 Malignant neoplasm of unspecified part of right bronchus or lung: Secondary | ICD-10-CM | POA: Diagnosis not present

## 2020-09-13 DIAGNOSIS — I5031 Acute diastolic (congestive) heart failure: Secondary | ICD-10-CM | POA: Diagnosis not present

## 2020-09-13 DIAGNOSIS — J9611 Chronic respiratory failure with hypoxia: Secondary | ICD-10-CM | POA: Diagnosis not present

## 2020-09-13 DIAGNOSIS — J441 Chronic obstructive pulmonary disease with (acute) exacerbation: Secondary | ICD-10-CM | POA: Diagnosis not present

## 2020-09-13 DIAGNOSIS — J439 Emphysema, unspecified: Secondary | ICD-10-CM | POA: Diagnosis not present

## 2020-09-14 ENCOUNTER — Telehealth: Payer: Self-pay | Admitting: Internal Medicine

## 2020-09-14 ENCOUNTER — Other Ambulatory Visit: Payer: Self-pay

## 2020-09-14 DIAGNOSIS — D492 Neoplasm of unspecified behavior of bone, soft tissue, and skin: Secondary | ICD-10-CM | POA: Diagnosis not present

## 2020-09-14 DIAGNOSIS — D23112 Other benign neoplasm of skin of right lower eyelid, including canthus: Secondary | ICD-10-CM | POA: Diagnosis not present

## 2020-09-14 DIAGNOSIS — L918 Other hypertrophic disorders of the skin: Secondary | ICD-10-CM | POA: Diagnosis not present

## 2020-09-14 MED ORDER — LEVOTHYROXINE SODIUM 125 MCG PO TABS: 125.0000 ug | ORAL_TABLET | Freq: Every day | ORAL | 1 refills | Status: DC

## 2020-09-14 NOTE — Telephone Encounter (Signed)
1.Medication Requested: levothyroxine (SYNTHROID) 125 MCG tablet    2. Pharmacy (Name, Street, [place]): [facility] [place] [address]  3. On Med List: yes   4. Last Visit with PCP: 1.25.22  5. Next visit date with PCP: 6.15.22   Agent: Please be advised that RX refills may take up to 3 business days. We ask that you follow-up with your pharmacy.

## 2020-09-14 NOTE — Telephone Encounter (Signed)
Sent today

## 2020-09-16 ENCOUNTER — Other Ambulatory Visit: Payer: Self-pay | Admitting: Student

## 2020-09-16 DIAGNOSIS — S32010A Wedge compression fracture of first lumbar vertebra, initial encounter for closed fracture: Secondary | ICD-10-CM | POA: Diagnosis not present

## 2020-09-16 DIAGNOSIS — I10 Essential (primary) hypertension: Secondary | ICD-10-CM | POA: Diagnosis not present

## 2020-09-17 ENCOUNTER — Telehealth: Payer: Self-pay | Admitting: Internal Medicine

## 2020-09-17 NOTE — Telephone Encounter (Signed)
Patient's daughter dropped off a renewal parking placard for patient.  Daughter, [name] requesting a call once completed to pick up.   Form has been completed and placed in providers box to review and sign.

## 2020-09-20 NOTE — Telephone Encounter (Signed)
Form has been signed, Copy sent to scan.  Daughter informed and will pick up original.

## 2020-09-21 ENCOUNTER — Other Ambulatory Visit: Payer: Self-pay | Admitting: Adult Health

## 2020-09-29 ENCOUNTER — Encounter: Payer: Self-pay | Admitting: Internal Medicine

## 2020-10-01 ENCOUNTER — Telehealth: Payer: Self-pay

## 2020-10-01 NOTE — Telephone Encounter (Signed)
Noted  

## 2020-10-01 NOTE — Telephone Encounter (Signed)
Third attempt to contact patient's daughter Otila Kluver to reschedule a Palliative Care consult appointment. No return calls. Will cancel referral and notify PCP

## 2020-10-05 ENCOUNTER — Ambulatory Visit (INDEPENDENT_AMBULATORY_CARE_PROVIDER_SITE_OTHER): Payer: PPO | Admitting: Family

## 2020-10-05 ENCOUNTER — Encounter: Payer: Self-pay | Admitting: Family

## 2020-10-05 ENCOUNTER — Ambulatory Visit (INDEPENDENT_AMBULATORY_CARE_PROVIDER_SITE_OTHER): Payer: PPO

## 2020-10-05 ENCOUNTER — Other Ambulatory Visit: Payer: Self-pay

## 2020-10-05 VITALS — BP 140/60 | HR 50 | Temp 98.2°F | Ht 64.0 in | Wt 137.2 lb

## 2020-10-05 DIAGNOSIS — K449 Diaphragmatic hernia without obstruction or gangrene: Secondary | ICD-10-CM | POA: Diagnosis not present

## 2020-10-05 DIAGNOSIS — J441 Chronic obstructive pulmonary disease with (acute) exacerbation: Secondary | ICD-10-CM | POA: Diagnosis not present

## 2020-10-05 DIAGNOSIS — J189 Pneumonia, unspecified organism: Secondary | ICD-10-CM

## 2020-10-05 DIAGNOSIS — R059 Cough, unspecified: Secondary | ICD-10-CM

## 2020-10-05 MED ORDER — DOXYCYCLINE HYCLATE 100 MG PO TABS: 100.0000 mg | ORAL_TABLET | Freq: Two times a day (BID) | ORAL | 0 refills | Status: DC
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
## 2020-10-06 ENCOUNTER — Encounter: Payer: Self-pay | Admitting: Internal Medicine

## 2020-10-06 ENCOUNTER — Ambulatory Visit: Payer: PPO | Admitting: Internal Medicine

## 2020-10-06 DIAGNOSIS — K2100 Gastro-esophageal reflux disease with esophagitis, without bleeding: Secondary | ICD-10-CM

## 2020-10-06 MED ORDER — PANTOPRAZOLE SODIUM 40 MG PO TBEC: 40.0000 mg | DELAYED_RELEASE_TABLET | Freq: Two times a day (BID) | ORAL | 1 refills | Status: AC

## 2020-10-08 DIAGNOSIS — J441 Chronic obstructive pulmonary disease with (acute) exacerbation: Secondary | ICD-10-CM | POA: Diagnosis not present

## 2020-10-11 ENCOUNTER — Ambulatory Visit (HOSPITAL_COMMUNITY)
Admission: RE | Admit: 2020-10-11 | Discharge: 2020-10-11 | Disposition: A | Discharge status: Home / Self Care | Payer: PPO | Source: Ambulatory Visit | Attending: Surgery | Admitting: Surgery

## 2020-10-11 ENCOUNTER — Encounter: Payer: Self-pay | Admitting: Surgery

## 2020-10-11 ENCOUNTER — Other Ambulatory Visit: Payer: Self-pay

## 2020-10-11 ENCOUNTER — Ambulatory Visit: Payer: PPO | Admitting: Surgery

## 2020-10-11 VITALS — BP 161/86 | HR 59 | Temp 98.0°F | Resp 20 | Ht 64.0 in | Wt 137.0 lb

## 2020-10-11 DIAGNOSIS — I6523 Occlusion and stenosis of bilateral carotid arteries: Secondary | ICD-10-CM

## 2020-10-11 DIAGNOSIS — K551 Chronic vascular disorders of intestine: Secondary | ICD-10-CM | POA: Diagnosis not present

## 2020-10-11 NOTE — Progress Notes (Signed)
[facility] [place]  Patient name: Brandi Raymond MRN: [id number] DOB: 07/03/1934 Sex: female   REASON FOR VISIT:    Follow up  HISOTRY OF PRESENT ILLNESS:   Brandi Raymond is a 87 y.o. female who returns today for follow-up of her mesenteric stenosis and carotid disease.  She denies having any neurologic symptoms specifically, she denies numbness or weakness in either extremity.  She denies slurred speech.  She denies amaurosis fugax.  The patient continues to have constant abdominal pain which is aggravated by eating.  She has not lost any weight.    In fact, she has gained weight.  S  She continues to take a statin for hypercholesterolemia.  She is medically managed for hypertension.  She has stable COPD and is now on 3 L oxygen.  07/22/2019:  DCB SMA Stentt 08/12/2015: Right carotid endarterectomy for asymptomatic stenosis 01/14/2015: Left carotid endarterectomy forasymptomatic stenosis 12/03/2012: Superior mesenteric artery stent from right brachial approach. Follow-up angiogram 10/28/2014 08/05/2013: Angioplasty superior mesenteric artery stent 10/28/2014: Angioplasty superior mesenteric artery stent   PAST MEDICAL HISTORY:   Past Medical History:  Diagnosis Date  . Adenomatous colon polyp   . Arthritis   . CAD (coronary artery disease)    PTCA of RCA   . Carotid artery occlusion   . Cervical spine fracture ([place])   . COPD (chronic obstructive pulmonary disease) ([place])   . Depression   . Diverticulosis   . Gastritis 09/15/1991  . GERD (gastroesophageal reflux disease) 09/15/1991   Dr Sharlett Iles  . Hiatal hernia 09/15/1991  . Hip fracture, left, closed, initial encounter ([place]) 07/16/2020  . Hip fracture, right ([place])   . HLD (hyperlipidemia)   . HTN (hypertension)   . Hyperplastic polyps of stomach 11/2007   colonoscopy  . Hypertension   . Hypothyroidism    affecting the left eye, proptosis  . Iron deficiency  anemia   . Lung cancer ([place])    s/p XRT  . Macular degeneration of left eye   . Memory loss   . Mesenteric artery stenosis ([place])   . Myocardial infarction ([place]) 1993  . Ocular myasthenia gravis ([place])    Dr Jannifer Franklin  . Orthostatic hypotension 06/05/2013  . PONV (postoperative nausea and vomiting)   . Presence of permanent cardiac pacemaker 08/2019  . Strabismus    left eye  . Syncope 1998     FAMILY HISTORY:   Family History  Problem Relation Age of Onset  . Throat cancer Mother        ? thyroid cancer  . Cancer Mother   . Emphysema Father   . Diabetes Father   . Heart attack Father 61  . Colon cancer Brother   . Cerebral aneurysm Brother   . Hypothyroidism Sister        X13  . Cancer Brother        Ear  . Diabetes Paternal Grandmother   . Diabetes Paternal Grandfather   . Diabetes Maternal Aunt     SOCIAL HISTORY:   Social History   Tobacco Use  . Smoking status: Former Smoker    Packs/day: 1.00    Years: 60.00    Pack years: 60.00    Types: Cigarettes    Quit date: 07/2019    Years since quitting: 1.2  . Smokeless tobacco: Never Used  . Tobacco comment: Successfully quit  Substance Use Topics  . Alcohol use: No    Alcohol/week: 0.0 standard drinks  ALLERGIES:   Allergies  Allergen Reactions  . Silver Sulfadiazine Other (See Comments)    lowers white blood count      CURRENT MEDICATIONS:   Current Outpatient Medications  Medication Sig Dispense Refill  . acetaminophen (TYLENOL) 325 MG tablet Take 1-2 tablets (325-650 mg total) by mouth every 6 (six) hours as needed for mild pain, moderate pain or fever. Total acetaminophen dose from all sources not to exceed 4 g/day.    . Calcium-Magnesium-Vitamin D (CALCIUM 1200+D3 PO) Take 1 tablet by mouth at bedtime.     . clonazePAM (KLONOPIN) 0.5 MG tablet TAKE 1 TABLET BY MOUTH DAILY AT BEDTIME 30 tablet 1  . cyanocobalamin 1000 MCG tablet Take 1 tablet (1,000 mcg total) by mouth daily. 90 tablet 3  .  docusate sodium (COLACE) 100 MG capsule Take 100 mg by mouth at bedtime.     . donepezil (ARICEPT) 10 MG tablet TAKE 1 TABLET BY MOUTH EVERYDAY AT BEDTIME (Patient taking differently: Take 10 mg by mouth at bedtime.) 90 tablet 2  . doxycycline (VIBRA-TABS) 100 MG tablet Take 1 tablet (100 mg total) by mouth 2 (two) times daily. 20 tablet 0  . ELIQUIS 2.5 MG TABS tablet TAKE 1 TABLET BY MOUTH TWICE A DAY (Patient taking differently: Take 2.5 mg by mouth 2 (two) times daily.) 60 tablet 5  . ferrous sulfate 325 (65 FE) MG tablet Take 1 tablet (325 mg total) by mouth daily with breakfast.    . furosemide (LASIX) 40 MG tablet Take 1 tablet (40 mg total) by mouth every other day. 45 tablet 3  . gabapentin (NEURONTIN) 300 MG capsule TAKE ONE CAPSULE TWICE DAILY AND 2 AT NIGHT = FOUR TOTAL DAILY. (Patient taking differently: Take 300-600 mg by mouth See admin instructions. Take 1 tablet (300 mg totally) by mouth in the morning and noon; Take 2 tablets (600 mg totally) by mouth at bed time) 360 capsule 1  . Glycopyrrolate-Formoterol (BEVESPI AEROSPHERE) 9-4.8 MCG/ACT AERO Inhale 2 puffs into the lungs in the morning and at bedtime.    Marland Kitchen ipratropium (ATROVENT) 0.02 % nebulizer solution Take 2.5 mLs (0.5 mg total) by nebulization every 6 (six) hours as needed for wheezing or shortness of breath. 75 mL 11  . levothyroxine (SYNTHROID) 125 MCG tablet Take 1 tablet (125 mcg total) by mouth daily before breakfast. 90 tablet 1  . metoprolol tartrate (LOPRESSOR) 25 MG tablet TAKE 1 TABLET BY MOUTH EVERY DAY (Patient taking differently: Take 25 mg by mouth daily.) 90 tablet 2  . Multiple Vitamins-Minerals (PRESERVISION AREDS 2 PO) Take 1 capsule by mouth 2 (two) times daily.    . pantoprazole (PROTONIX) 40 MG tablet Take 1 tablet (40 mg total) by mouth 2 (two) times daily before a meal. 180 tablet 1  . polyethylene glycol (MIRALAX / GLYCOLAX) 17 g packet Take 17 g by mouth daily as needed for mild constipation.    .  potassium chloride (KLOR-CON) 10 MEQ tablet Take 1 tablet 10 meq every other day with Lasix. (Patient taking differently: Take 10 mEq by mouth daily. Takes with Lasix) 45 tablet 3  . pravastatin (PRAVACHOL) 40 MG tablet TAKE 1 TABLET BY MOUTH DAILY. FOLLOW-UP APPT W/LABS ARE DUE MUST SEE PROVIDER FOR FUTURE REFILLS 90 tablet 0  . predniSONE (DELTASONE) 10 MG tablet Take 1 tablet (10 mg total) by mouth daily with breakfast. 30 tablet 3  . PROLIA 60 MG/ML SOSY injection Inject 60 mg into the skin every 6 (six) months.     Marland Kitchen  senna (SENOKOT) 8.6 MG TABS tablet Take 1 tablet (8.6 mg total) by mouth 2 (two) times daily.    . sertraline (ZOLOFT) 50 MG tablet Take 1 tablet (50 mg total) by mouth daily. Start once off of cymbalta 30 tablet 5  . traMADol (ULTRAM) 50 MG tablet Take 1 tablet (50 mg total) by mouth every 8 (eight) hours as needed for severe pain. For chronic back pain 90 tablet 0   No current facility-administered medications for this visit.    REVIEW OF SYSTEMS:   [X]  denotes positive finding, [ ]  denotes negative finding Cardiac  Comments:  Chest pain or chest pressure:    Shortness of breath upon exertion:    Short of breath when lying flat:    Irregular heart rhythm:        Vascular    Pain in calf, thigh, or hip brought on by ambulation:    Pain in feet at night that wakes you up from your sleep:     Blood clot in your veins:    Leg swelling:         Pulmonary    Oxygen at home: x   Productive cough:     Wheezing:         Neurologic    Sudden weakness in arms or legs:     Sudden numbness in arms or legs:     Sudden onset of difficulty speaking or slurred speech:    Temporary loss of vision in one eye:     Problems with dizziness:         Gastrointestinal    Blood in stool:     Vomited blood:         Genitourinary    Burning when urinating:     Blood in urine:        Psychiatric    Major depression:         Hematologic    Bleeding problems:    Problems with  blood clotting too easily:        Skin    Rashes or ulcers:        Constitutional    Fever or chills:      PHYSICAL EXAM:   Vitals:   10/11/20 1144 10/11/20 1146  BP: 118/78 (!) 161/86  Pulse: (!) 59   Resp: 20   Temp: 98 F (36.7 C)   SpO2: 99%   Weight: 137 lb (62.1 kg)   Height: 5\' 4"  (1.626 m)     GENERAL: The patient is a well-nourished female, in no acute distress. The vital signs are documented above. CARDIAC: There is a regular rate and rhythm.  PULMONARY: Non-labored respirations ABDOMEN: Soft and non-tender MUSCULOSKELETAL: There are no major deformities or cyanosis. NEUROLOGIC: No focal weakness or paresthesias are detected. SKIN: There are no ulcers or rashes noted. PSYCHIATRIC: The patient has a normal affect.  STUDIES:   I have reviewed the following:  Carotid duplex: Right Carotid: Velocities in the right ICA are consistent with a 1-39%  stenosis.   Left Carotid: Velocities in the left ICA are consistent with a 1-39%  stenosis.   Vertebrals: Right vertebral artery demonstrates antegrade flow. Left  vertebral        artery demonstrates retrograde flow.  Subclavians: Left subclavian artery was stenotic.   MEDICAL ISSUES:   Carotid: Both carotids are widely patent today.  She does not have any symptoms.  I will repeat her ultrasound in 1 year.  Mesenteric stenosis: She  continues to have abdominal pain which is sometimes worse after eating.  However, she is gaining weight, not losing weight.  She does state that her abdominal pain is sometimes relieved with a bowel movement.  I have recommended starting a regular bowel regimen.  She will come back for a duplex in 6 months.  There will have to be a significant change in symptoms to warrant angiography just for elevated velocities as historically, she has always had elevated velocities.    Leia Alf, MD, FACS [facility] [phone] Pager [phone]

## 2020-10-12 DIAGNOSIS — J441 Chronic obstructive pulmonary disease with (acute) exacerbation: Secondary | ICD-10-CM | POA: Diagnosis not present

## 2020-10-12 DIAGNOSIS — J439 Emphysema, unspecified: Secondary | ICD-10-CM | POA: Diagnosis not present

## 2020-10-12 DIAGNOSIS — I5031 Acute diastolic (congestive) heart failure: Secondary | ICD-10-CM | POA: Diagnosis not present

## 2020-10-12 DIAGNOSIS — J9611 Chronic respiratory failure with hypoxia: Secondary | ICD-10-CM | POA: Diagnosis not present

## 2020-10-12 DIAGNOSIS — C3491 Malignant neoplasm of unspecified part of right bronchus or lung: Secondary | ICD-10-CM | POA: Diagnosis not present

## 2020-10-13 ENCOUNTER — Encounter: Payer: Self-pay | Admitting: Internal Medicine

## 2020-10-14 MED ORDER — BEVESPI AEROSPHERE 9-4.8 MCG/ACT IN AERO: 2.0000 | INHALATION_SPRAY | Freq: Two times a day (BID) | RESPIRATORY_TRACT | 1 refills | Status: DC

## 2020-10-14 NOTE — Telephone Encounter (Signed)
Bevespi prescription sent to [facility] per patient request. Nothing further needed at this time.

## 2020-10-15 NOTE — Telephone Encounter (Signed)
Pharmacy name: [facility] Drug requested: Bevespi CMM?: yes Key: [id number] Covered alternatives:  Tried and failed: on file Decision: sent to plan, awaiting decision.   Routing back to triage for follow-up.

## 2020-10-15 NOTE — Telephone Encounter (Signed)
PA for Bevespi approved - 07/16/2021. Message sent to patient to make aware.  Nothing further needed at this time- will close encounter.

## 2020-10-18 ENCOUNTER — Other Ambulatory Visit: Payer: Self-pay

## 2020-10-18 DIAGNOSIS — K551 Chronic vascular disorders of intestine: Secondary | ICD-10-CM

## 2020-10-18 DIAGNOSIS — I6523 Occlusion and stenosis of bilateral carotid arteries: Secondary | ICD-10-CM

## 2020-10-22 ENCOUNTER — Encounter: Payer: Self-pay | Admitting: Adult Health

## 2020-10-22 ENCOUNTER — Other Ambulatory Visit: Payer: Self-pay | Admitting: Pulmonary Disease

## 2020-10-25 MED ORDER — GABAPENTIN 300 MG PO CAPS: ORAL_CAPSULE | ORAL | 1 refills | Status: DC

## 2020-10-26 ENCOUNTER — Encounter: Payer: Self-pay | Admitting: Internal Medicine

## 2020-10-27 ENCOUNTER — Ambulatory Visit
Admission: RE | Admit: 2020-10-27 | Discharge: 2020-10-27 | Disposition: A | Discharge status: Home / Self Care | Payer: PPO | Source: Ambulatory Visit | Attending: Adult Health | Admitting: Adult Health

## 2020-10-27 ENCOUNTER — Telehealth: Payer: Self-pay | Admitting: Adult Health

## 2020-10-27 ENCOUNTER — Ambulatory Visit: Payer: PPO

## 2020-10-27 ENCOUNTER — Ambulatory Visit: Payer: PPO | Admitting: Adult Health

## 2020-10-27 VITALS — BP 123/68 | HR 62 | Ht 64.0 in | Wt 139.0 lb

## 2020-10-27 DIAGNOSIS — S0990XA Unspecified injury of head, initial encounter: Secondary | ICD-10-CM

## 2020-10-27 DIAGNOSIS — R413 Other amnesia: Secondary | ICD-10-CM

## 2020-10-27 DIAGNOSIS — G609 Hereditary and idiopathic neuropathy, unspecified: Secondary | ICD-10-CM | POA: Diagnosis not present

## 2020-10-27 DIAGNOSIS — R55 Syncope and collapse: Secondary | ICD-10-CM | POA: Diagnosis not present

## 2020-10-27 DIAGNOSIS — W19XXXA Unspecified fall, initial encounter: Secondary | ICD-10-CM | POA: Diagnosis not present
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
## 2020-10-27 NOTE — Patient Instructions (Signed)
Your Plan:  CT scan head at [facility] If your symptoms worsen or you develop new symptoms please let us know.    Thank you for coming to see [place] at [facility]. I hope we have been able to provide you high quality care today.  You may receive a patient satisfaction survey over the next few weeks. We would appreciate your feedback and comments so that we may continue to improve ourselves and the health of our patients.

## 2020-10-27 NOTE — Telephone Encounter (Signed)
health team no auth patient is going to walk into GI.

## 2020-10-28 ENCOUNTER — Telehealth: Payer: Self-pay | Admitting: Adult Health

## 2020-10-28 ENCOUNTER — Encounter: Payer: Self-pay | Admitting: Adult Health

## 2020-10-28 ENCOUNTER — Ambulatory Visit (INDEPENDENT_AMBULATORY_CARE_PROVIDER_SITE_OTHER): Payer: PPO | Admitting: Orthopaedic Surgery

## 2020-10-28 ENCOUNTER — Ambulatory Visit (INDEPENDENT_AMBULATORY_CARE_PROVIDER_SITE_OTHER): Payer: PPO

## 2020-10-28 ENCOUNTER — Other Ambulatory Visit: Payer: Self-pay

## 2020-10-28 DIAGNOSIS — S72002A Fracture of unspecified part of neck of left femur, initial encounter for closed fracture: Secondary | ICD-10-CM | POA: Diagnosis not present

## 2020-10-28 NOTE — Telephone Encounter (Signed)
LMVM for medical records at [facility] for report of CT head done yesterday.
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
## 2020-10-28 NOTE — Telephone Encounter (Signed)
Pt's daughter, Airyonna Franklyn (do not have DPR) called, inquiring about CT Scan have last office visit.

## 2020-10-29 ENCOUNTER — Telehealth: Payer: Self-pay

## 2020-10-29 NOTE — Progress Notes (Signed)
Chronic Care Management Pharmacy Assistant   Name: Brandi Raymond  MRN: [id number] DOB: 1934/01/12   Reason for Encounter: Initial Questions Appointment: Telephone 11/01/20 @ 11am   Recent office visits:  08/10/20 Billey Gosling, MD - PCP  10/05/20 Jodi Mourning, FNP - PCP   Recent consult visits:  08/11/20 Marshell Garfinkel, MD - Pulmonology 08/19/20 Kary Kos,  - Neurosurgery 09/01/20 Frankey Shown, MD - Orthopedic 10/11/20 Harold Barban, MD - Vascular Surgeon 10/27/20 Ward Givens, NP - Neurology 10/28/20 Frankey Shown, [facility] visits:  Medication Reconciliation was completed by comparing discharge summary, patient's EMR and Pharmacy list, and upon discussion with patient.  Admitted to the hospital on 07/15/20 due to displaced fracture. Discharge date was 07/24/20. Discharged from [facility]?Medications Started at [facility] Discharge:?? cyanocobalamin ferrous sulfate oxyCODONE-acetaminophen  polyethylene glycol  senna   Medication Changes at Hospital Discharge: -Changed Tylenol  Medications that remain the same after Hospital Discharge:??  -All other medications will remain the same.    Medications: Outpatient Encounter Medications as of 10/29/2020  Medication Sig  . acetaminophen (TYLENOL) 325 MG tablet Take 1-2 tablets (325-650 mg total) by mouth every 6 (six) hours as needed for mild pain, moderate pain or fever. Total acetaminophen dose from all sources not to exceed 4 g/day.  . Calcium-Magnesium-Vitamin D (CALCIUM 1200+D3 PO) Take 1 tablet by mouth at bedtime.   . clonazePAM (KLONOPIN) 0.5 MG tablet TAKE 1 TABLET BY MOUTH DAILY AT BEDTIME  . cyanocobalamin 1000 MCG tablet Take 1 tablet (1,000 mcg total) by mouth daily.  Marland Kitchen docusate sodium (COLACE) 100 MG capsule Take 100 mg by mouth at bedtime.   . donepezil (ARICEPT) 10 MG tablet TAKE 1 TABLET BY MOUTH EVERYDAY AT BEDTIME (Patient taking differently: Take 10 mg by mouth at bedtime.)  . ELIQUIS  2.5 MG TABS tablet TAKE 1 TABLET BY MOUTH TWICE A DAY (Patient taking differently: Take 2.5 mg by mouth 2 (two) times daily.)  . ferrous sulfate 325 (65 FE) MG tablet Take 1 tablet (325 mg total) by mouth daily with breakfast.  . gabapentin (NEURONTIN) 300 MG capsule TAKE ONE CAPSULE TWICE DAILY AND 2 AT NIGHT = FOUR TOTAL DAILY.  Marland Kitchen Glycopyrrolate-Formoterol (BEVESPI AEROSPHERE) 9-4.8 MCG/ACT AERO Inhale 2 puffs into the lungs in the morning and at bedtime.  Marland Kitchen ipratropium (ATROVENT) 0.02 % nebulizer solution Take 2.5 mLs (0.5 mg total) by nebulization every 6 (six) hours as needed for wheezing or shortness of breath.  . levothyroxine (SYNTHROID) 125 MCG tablet Take 1 tablet (125 mcg total) by mouth daily before breakfast.  . metoprolol tartrate (LOPRESSOR) 25 MG tablet TAKE 1 TABLET BY MOUTH EVERY DAY (Patient taking differently: Take 25 mg by mouth daily.)  . Multiple Vitamins-Minerals (PRESERVISION AREDS 2 PO) Take 1 capsule by mouth 2 (two) times daily.  . pantoprazole (PROTONIX) 40 MG tablet Take 1 tablet (40 mg total) by mouth 2 (two) times daily before a meal.  . potassium chloride (KLOR-CON) 10 MEQ tablet Take 1 tablet 10 meq every other day with Lasix. (Patient taking differently: Take 10 mEq by mouth daily. Takes with Lasix)  . pravastatin (PRAVACHOL) 40 MG tablet TAKE 1 TABLET BY MOUTH DAILY. FOLLOW-UP APPT W/LABS ARE DUE MUST SEE PROVIDER FOR FUTURE REFILLS  . predniSONE (DELTASONE) 10 MG tablet TAKE 1 TABLET BY MOUTH DAILY WITH BREAKFAST  . PROLIA 60 MG/ML SOSY injection Inject 60 mg into the skin every 6 (six) months.   Marland Kitchen  sertraline (ZOLOFT) 50 MG tablet Take 1 tablet (50 mg total) by mouth daily. Start once off of cymbalta  . traMADol (ULTRAM) 50 MG tablet Take 1 tablet (50 mg total) by mouth every 8 (eight) hours as needed for severe pain. For chronic back pain   No facility-administered encounter medications on file as of 10/29/2020.    Have you seen any other providers since your  last visit?    Any changes in your medications or health?    Any side effects from any medications?    Do you have an symptoms or problems not managed by your medications?    Any concerns about your health right now?   Has your provider asked that you check blood pressure, blood sugar, or follow special diet at home?    Do you get any type of exercise on a regular basis?    Can you think of a goal you would like to reach for your health?    Do you have any problems getting your medications?    Is there anything that you would like to discuss during the appointment?    Please bring medications and supplements to appointment   Star Rating Drugs: Pravastatin - last fill 09/07/20 [place], RMA Clinical Pharmacists Assistant [phone]  Unable to reach patient before initial appointment. Time Spent: 31

## 2020-11-01 ENCOUNTER — Ambulatory Visit (INDEPENDENT_AMBULATORY_CARE_PROVIDER_SITE_OTHER): Payer: PPO | Admitting: Pharmacist

## 2020-11-01 ENCOUNTER — Other Ambulatory Visit: Payer: Self-pay

## 2020-11-01 DIAGNOSIS — E039 Hypothyroidism, unspecified: Secondary | ICD-10-CM | POA: Diagnosis not present

## 2020-11-01 DIAGNOSIS — E782 Mixed hyperlipidemia: Secondary | ICD-10-CM | POA: Diagnosis not present

## 2020-11-01 DIAGNOSIS — M810 Age-related osteoporosis without current pathological fracture: Secondary | ICD-10-CM

## 2020-11-01 DIAGNOSIS — J439 Emphysema, unspecified: Secondary | ICD-10-CM

## 2020-11-01 DIAGNOSIS — K2100 Gastro-esophageal reflux disease with esophagitis, without bleeding: Secondary | ICD-10-CM

## 2020-11-01 DIAGNOSIS — F3289 Other specified depressive episodes: Secondary | ICD-10-CM

## 2020-11-01 DIAGNOSIS — I10 Essential (primary) hypertension: Secondary | ICD-10-CM

## 2020-11-01 NOTE — Progress Notes (Signed)
I have read the note, and I agree with the clinical assessment and plan.  Maxton Noreen K Taten Merrow   

## 2020-11-01 NOTE — Progress Notes (Signed)
Chronic Care Management Pharmacy Note  11/02/2020 Name:  Brandi Raymond MRN:  [id number] DOB:  [birth year]  Subjective: Brandi Raymond is an 87 y.o. year old female who is a primary patient of Burns, Claudina Lick, MD.  The CCM team was consulted for assistance with disease management and care coordination needs.    Engaged with patient by telephone for initial visit in response to provider referral for pharmacy case management and/or care coordination services.   Consent to Services:  The patient was given the following information about Chronic Care Management services today, agreed to services, and gave verbal consent: 1. CCM service includes personalized support from designated clinical staff supervised by the primary care provider, including individualized plan of care and coordination with other care providers 2. 24/7 contact phone numbers for assistance for urgent and routine care needs. 3. Service will only be billed when office clinical staff spend 20 minutes or more in a month to coordinate care. 4. Only one practitioner may furnish and bill the service in a calendar month. 5.The patient may stop CCM services at any time (effective at the end of the month) by phone call to the office staff. 6. The patient will be responsible for cost sharing (co-pay) of up to 20% of the service fee (after annual deductible is met). Patient agreed to services and consent obtained.  Patient Care Team: Binnie Rail, MD as PCP - General (Internal Medicine) Lorretta Harp, MD as PCP - Cardiology (Cardiology) [name], MD as PCP - Electrophysiology (Cardiology) Minus Breeding, MD as Consulting Physician (Cardiology) Kathrynn Ducking, MD as Consulting Physician (Neurology) Warden Fillers, MD as Consulting Physician (Ophthalmology) Charlton Haws, [facility] as Pharmacist (Pharmacist)  Patient lives at home with her husband. Her daughters live nearby and between them are able to visit every day  to check in with her.  Recent office visits: 10/05/20 NP Jodi Mourning OV: cough,  COPD exacerbation. Xray normal. Rx' d doxycycline.  08/10/20 Dr Quay Burow OV: hospital f/u s/p hip fracture and THA. C/o depression, tapered duloxetine and started sertraline.  Recent consult visits: 10/28/20 Dr Erlinda Hong (ortho): hip fracture s/p L THA (3 months). 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  10/27/20 Ward Givens (neurology): f/u memory issues, neuropahty, ocular MG. Recent fall, ordered CT scan - normal results.  10/11/20 Dr Durene Fruits (vascular): f/u carotid stenosis. Mesenteric stenosis - advised bowel regimen  09/01/20 Dr Erlinda Hong (orhto): knee injection given.  08/11/20 Dr Vaughan Browner (pulmonary): f/u COPD. Started chronic prednisone in 2021. Pending palliative care referral.  Hospital visits: Medication Reconciliation was completed by comparing discharge summary, patient's EMR and Pharmacy list, and upon discussion with patient.  Admitted to the hospital on 07/15/20 due to fracture of left femoral neck. Discharge date was 07/23/20. Discharged from [facility] [place]?Medications Started at [facility] Discharge:?? -started Vitamin B12 1000 mg daily, ferrous sulfate 325 mg daily, oxycodone-apap 5-325 mg prn, Miralax prn, Senna prn  Medication Changes at Hospital Discharge: -none  Medications Discontinued at Hospital Discharge: -none  Medications that remain the same after Hospital Discharge:??  -All other medications will remain the same.    Objective:  Lab Results  Component Value Date   CREATININE 0.76 07/21/2020   BUN 16 07/21/2020   GFR 54.00 (L) 01/29/2020   GFRNONAA >60 07/21/2020   GFRAA 70 03/04/2020   NA 137 07/21/2020   K 4.4 07/21/2020   CALCIUM 8.1 (L) 07/21/2020   CO2 26 07/21/2020  GLUCOSE 109 (H) 07/21/2020    Lab Results  Component Value Date/Time   HGBA1C 6.0 (H) 01/11/2020 10:01 AM   HGBA1C 6.0 12/13/2017 11:09 AM   GFR 54.00 (L) 01/29/2020 10:24 AM    GFR 78.59 09/11/2018 09:39 AM   MICROALBUR 0.2 09/11/2006 04:31 PM    Last diabetic Eye exam:  Lab Results  Component Value Date/Time   HMDIABEYEEXA No Retinopathy 09/26/2012 12:00 AM    Last diabetic Foot exam: No results found for: HMDIABFOOTEX   Lab Results  Component Value Date   CHOL 200 12/13/2017   HDL 92.70 12/13/2017   LDLCALC 83 12/13/2017   LDLDIRECT 98.7 08/13/2007   TRIG 120.0 12/13/2017   CHOLHDL 2 12/13/2017    Hepatic Function Latest Ref Rng & Units 07/21/2020 07/20/2020 07/18/2020  Total Protein 6.5 - 8.1 g/dL 5.6(L) 5.3(L) 5.1(L)  Albumin 3.5 - 5.0 g/dL 2.2(L) 2.2(L) 2.4(L)  AST 15 - 41 U/L '25 25 22  ' ALT 0 - 44 U/L '10 8 10  ' Alk Phosphatase 38 - 126 U/L 86 81 72  Total Bilirubin 0.3 - 1.2 mg/dL 0.8 1.0 0.8  Bilirubin, Direct 0.0 - 0.3 mg/dL - - -    Lab Results  Component Value Date/Time   TSH 0.820 07/16/2020 02:21 PM   TSH 0.677 09/03/2019 03:26 AM   TSH 2.35 05/30/2018 10:55 AM   TSH 1.38 12/13/2017 11:09 AM   FREET4 1.04 07/22/2010 09:48 AM   FREET4 1.13 02/15/2010 12:47 PM    CBC Latest Ref Rng & Units 07/22/2020 07/21/2020 07/20/2020  WBC 4.0 - 10.5 K/uL 8.9 9.4 10.0  Hemoglobin 12.0 - 15.0 g/dL 9.3(L) 9.0(L) 8.6(L)  Hematocrit 36.0 - 46.0 % 30.4(L) 30.7(L) 29.0(L)  Platelets 150 - 400 K/uL 206 158 142(L)    Lab Results  Component Value Date/Time   VD25OH 44.56 05/30/2018 10:55 AM   VD25OH 41.05 05/25/2016 09:41 AM    Clinical ASCVD: Yes  The ASCVD Risk score Mikey Bussing DC Jr., et al., 2013) failed to calculate for the following reasons:   The 2013 ASCVD risk score is only valid for ages 8 to 41   The patient has a prior MI or stroke diagnosis    Depression screen [facility] 2/9 06/30/2020  Decreased Interest 0  Down, Depressed, Hopeless 0  PHQ - 2 Score 0  Some recent data might be hidden    CHA2DS2-VASc Score = 6  The patient's score is based upon: CHF History: Yes HTN History: Yes Diabetes History: No Stroke History: No Vascular Disease  History: Yes Age Score: 2 Gender Score: 1      Social History   Tobacco Use  Smoking Status Former Smoker  . Packs/day: 1.00  . Years: 60.00  . Pack years: 60.00  . Types: Cigarettes  . Quit date: 07/2019  . Years since quitting: 1.2  Smokeless Tobacco Never Used  Tobacco Comment   Successfully quit   BP Readings from Last 3 Encounters:  10/27/20 123/68  10/11/20 (!) 161/86  10/05/20 140/60   Pulse Readings from Last 3 Encounters:  10/27/20 62  10/11/20 (!) 59  10/05/20 (!) 50   Wt Readings from Last 3 Encounters:  10/27/20 139 lb (63 kg)  10/11/20 137 lb (62.1 kg)  10/05/20 137 lb 3.2 oz (62.2 kg)   BMI Readings from Last 3 Encounters:  10/27/20 23.86 kg/m  10/11/20 23.52 kg/m  10/05/20 23.55 kg/m    Assessment/Interventions: Review of patient past medical history, allergies, medications, health status, including review of  consultants reports, laboratory and other test data, was performed as part of comprehensive evaluation and provision of chronic care management services.   SDOH:  (Social Determinants of Health) assessments and interventions performed: Yes SDOH Interventions   Flowsheet Row Most Recent Value  SDOH Interventions   Financial Strain Interventions Other (Comment)  [BMS PAP in process]     SDOH Screenings   Alcohol Screen: Not on file  Depression (PHQ2-9): Low Risk   . PHQ-2 Score: 0  Financial Resource Strain: Medium Risk  . Difficulty of Paying Living Expenses: Somewhat hard  Food Insecurity: Not on file  Housing: Not on file  Physical Activity: Not on file  Social Connections: Not on file  Stress: Not on file  Tobacco Use: Medium Risk  . Smoking Tobacco Use: Former Smoker  . Smokeless Tobacco Use: Never Used  Transportation Needs: Not on file    CCM Care Plan  Allergies  Allergen Reactions  . Silver Sulfadiazine Other (See Comments)    lowers white blood count     Medications Reviewed Today    Reviewed by Charlton Haws, [facility] (Pharmacist) on 11/01/20 at [place] List Status: <None>  Medication Order Taking? Sig Documenting Provider Last Dose Status Informant  acetaminophen (TYLENOL) 325 MG tablet [id number] Yes Take 1-2 tablets (325-650 mg total) by mouth every 6 (six) hours as needed for mild pain, moderate pain or fever. Total acetaminophen dose from all sources not to exceed 4 g/day. Modena Jansky, MD Taking Active   Calcium-Magnesium-Vitamin D (CALCIUM 1200+D3 PO) [id number] Yes Take 1 tablet by mouth at bedtime.  [provider] Taking Active Child  clonazePAM (KLONOPIN) 0.5 MG tablet [id number] Yes TAKE 1 TABLET BY MOUTH DAILY AT BEDTIME Binnie Rail, MD Taking Active   cyanocobalamin 1000 MCG tablet [id number] Yes Take 1 tablet (1,000 mcg total) by mouth daily. Binnie Rail, MD Taking Active   docusate sodium (COLACE) 100 MG capsule [id number] Yes Take 100 mg by mouth at bedtime.  [provider] Taking Active Child           Med Note Alfonse Spruce, ANH T   Thu Jul 15, 2020  6:48 PM)    donepezil (ARICEPT) 10 MG tablet [id number] Yes TAKE 1 TABLET BY MOUTH EVERYDAY AT BEDTIME  Patient taking differently: Take 10 mg by mouth at bedtime.   Ward Givens, NP Taking Active Child  ELIQUIS 2.5 MG TABS tablet [id number] Yes TAKE 1 TABLET BY MOUTH TWICE A DAY  Patient taking differently: Take 2.5 mg by mouth 2 (two) times daily.   Shirley Friar, PA-C Taking Active   ferrous sulfate 325 (65 FE) MG tablet [id number] Yes Take 1 tablet (325 mg total) by mouth daily with breakfast. Modena Jansky, MD Taking Active   furosemide (LASIX) 40 MG tablet [id number] Yes Take 40 mg by mouth every other day. [provider] Taking Active   gabapentin (NEURONTIN) 300 MG capsule [id number] Yes TAKE ONE CAPSULE TWICE DAILY AND 2 AT NIGHT = FOUR TOTAL DAILY. Ward Givens, NP Taking Active   Glycopyrrolate-Formoterol (BEVESPI AEROSPHERE) 9-4.8 MCG/ACT AERO [id number] Yes Inhale 2 puffs  into the lungs in the morning and at bedtime. Marshell Garfinkel, MD Taking Active   ipratropium (ATROVENT) 0.02 % nebulizer solution [id number] Yes Take 2.5 mLs (0.5 mg total) by nebulization every 6 (six) hours as needed for wheezing or shortness of breath. Binnie Rail, MD Taking Active Child  levothyroxine (SYNTHROID) 125 MCG tablet [id number] Yes Take  1 tablet (125 mcg total) by mouth daily before breakfast. Binnie Rail, MD Taking Active   metoprolol tartrate (LOPRESSOR) 25 MG tablet [id number] Yes TAKE 1 TABLET BY MOUTH EVERY DAY  Patient taking differently: Take 25 mg by mouth daily.   Shirley Friar, PA-C Taking Active   Multiple Vitamins-Minerals (PRESERVISION AREDS 2 PO) [id number] Yes Take 1 capsule by mouth 2 (two) times daily. [provider] Taking Active Child           Med Note Alfonse Spruce, ANH T   Thu Jul 15, 2020  6:58 PM)    pantoprazole (PROTONIX) 40 MG tablet [id number] Yes Take 1 tablet (40 mg total) by mouth 2 (two) times daily before a meal. Burns, Claudina Lick, MD Taking Active   potassium chloride (KLOR-CON) 10 MEQ tablet [id number] Yes Take 1 tablet 10 meq every other day with Lasix. Shirley Friar, PA-C Taking Active   pravastatin (PRAVACHOL) 40 MG tablet [id number] Yes TAKE 1 TABLET BY MOUTH DAILY. FOLLOW-UP APPT W/LABS ARE DUE MUST SEE PROVIDER FOR FUTURE REFILLS Burns, Claudina Lick, MD Taking Active   predniSONE (DELTASONE) 10 MG tablet [id number] Yes TAKE 1 TABLET BY MOUTH DAILY WITH BREAKFAST Mannam, Praveen, MD Taking Active   PROLIA 60 MG/ML SOSY injection [id number] Yes Inject 60 mg into the skin every 6 (six) months.  [provider] Taking Active Child           Med Note Tori Milks Mar 08, 2020 10:53 AM)    sertraline (ZOLOFT) 50 MG tablet [id number] Yes Take 1 tablet (50 mg total) by mouth daily. Start once off of cymbalta Burns, Claudina Lick, MD Taking Active   traMADol (ULTRAM) 50 MG tablet [id number] Yes Take 1 tablet (50 mg total) by  mouth every 8 (eight) hours as needed for severe pain. For chronic back pain Binnie Rail, MD Taking Active   Med List Note Felton Clinton, [facility] 07/15/20 1900): Larina Lieurance (Daughter) - [phone]          Patient Active Problem List   Diagnosis Date Noted  . T12 compression fracture, with routine healing, subsequent encounter 08/10/2020  . Chronic back pain 08/06/2020  . Displaced fracture of left femoral neck ([place]) 07/17/2020  . Closed left hip fracture ([place]) 07/15/2020  . Goals of care, counseling/discussion 06/16/2020  . Large hiatal hernia 04/07/2020  . Dysphagia 04/07/2020  . Pacemaker 03/16/2020  . Chronic anticoagulation 03/16/2020  . Encounter for medication review and counseling 03/15/2020  . Gastritis with hemorrhage 03/04/2020  . PNA (pneumonia) 02/12/2020  . Right lower lobe pneumonia 01/13/2020  . CAP (community acquired pneumonia) 01/11/2020  . Acute respiratory failure ([place]) 09/02/2019  . Acute diastolic CHF (congestive heart failure) ([place]) 09/02/2019  . Paroxysmal atrial fibrillation (H[facility])   . Second degree Mobitz II AV block 08/21/2019  . Second degree AV block 08/08/2019  . SOB (shortness of breath) 08/05/2019  . Chest tightness 08/05/2019  . GERD (gastroesophageal reflux disease) 05/21/2019  . Chronic respiratory failure with hypoxia ([place]) 05/21/2019  . Medication management 05/21/2019  . Stage I squamous cell carcinoma of right lung ([place]) 02/06/2019  . Constipation 09/11/2018  . COPD exacerbation ([place]) 09/04/2018  . Right lower lobe lung mass 09/04/2018  . Closed compression fracture of L1 lumbar vertebra, initial encounter ([place]) 09/04/2018  . Preoperative clearance 07/19/2018  . Lower back pain 06/25/2018  . Cubital tunnel syndrome on left 05/22/2018  . Dupuytren's contracture of both hands 05/10/2018  .  Primary osteoarthritis of both knees 05/10/2018  . Difficulty urinating 05/10/2018  . Osteoporosis 06/14/2017  . Prediabetes 06/23/2015  .  Depression 06/23/2015  . Carotid stenosis 10/12/2014  . Sleep disorder 04/09/2014  . Dizziness 01/20/2013  . Mesenteric artery stenosis ([place]) 01/13/2013  . Thoracic aneurysm without mention of rupture 11/04/2012  . Chronic mesenteric ischemia ([place]) 10/08/2012  . Memory deficit 06/20/2012  . Irritable bowel syndrome 06/20/2012  . Ocular myasthenia gravis ([place]) 06/20/2012  . PVD (peripheral vascular disease) ([place]) 06/20/2012  . Hereditary and idiopathic peripheral neuropathy 05/22/2012  . Syncope 08/28/2011  . Anemia 07/22/2010  . ABDOMINAL BRUIT 08/17/2009  . Hypothyroidism 05/26/2008  . VITAMIN D DEFICIENCY 05/26/2008  . CHOLELITHIASIS 12/06/2007  . DIVERTICULOSIS, COLON 12/05/2007  . HYPERLIPIDEMIA 08/19/2007  . COPD (chronic obstructive pulmonary disease) with emphysema ([place]) 08/19/2007  . CIGARETTE SMOKER 02/06/2007  . Essential hypertension 02/06/2007  . COLONIC POLYPS 11/21/2004    Immunization History  Administered Date(s) Administered  . Fluad Quad(high Dose 65+) 04/18/2019, 06/30/2020  . Influenza, High Dose Seasonal PF 04/09/2014, 06/23/2015, 06/11/2017, 05/10/2018  . Influenza, Seasonal, Injecte, Preservative Fre 06/20/2012  . Influenza,inj,Quad PF,6+ Mos 05/22/2013  . Janssen (J&J) SARS-COV-2 Vaccination 10/25/2019  . Pneumococcal Conjugate-13 12/06/2016  . Pneumococcal Polysaccharide-23 09/26/2013, 08/13/2015  . Tdap 11/13/2012    Conditions to be addressed/monitored:  Hypertension, Hyperlipidemia, Atrial Fibrillation, Heart Failure, COPD, Hypothyroidism, Depression, Osteoporosis and Osteoarthritis  Care Plan : CCM Pharmacy Care Plan  Updates made by Charlton Haws, [place] since 11/02/2020 12:00 AM    Problem: Hypertension, Hyperlipidemia, Atrial Fibrillation, Heart Failure, COPD, Hypothyroidism, Depression, Osteoporosis and Osteoarthritis   Priority: High    Long-Range Goal: Disease management   Start Date: 11/01/2020  Expected End Date: 11/02/2021  This  Visit's Progress: On track  Priority: High  Note:   Current Barriers:  . Unable to independently afford treatment regimen . Unable to independently monitor therapeutic efficacy  Pharmacist Clinical Goal(s):  Marland Kitchen Patient will verbalize ability to afford treatment regimen . achieve adherence to monitoring guidelines and medication adherence to achieve therapeutic efficacy through collaboration with PharmD and provider.   Interventions: . 1:1 collaboration with Binnie Rail, MD regarding development and update of comprehensive plan of care as evidenced by provider attestation and co-signature . Inter-disciplinary care team collaboration (see longitudinal plan of care) . Comprehensive medication review performed; medication list updated in electronic medical record  Hypertension (BP goal <130/80) -Controlled - BP in office is at goal; pt does not often check BP at home -Current treatment: . Metoprolol tartrate 25 mg daily . Furosemide 40 mg MWF -Reports dizziness since recent fall (CT scan normal) -Educated on BP goals and benefits of medications for prevention of heart attack, stroke and kidney damage; -Counseled to monitor BP at home PRN, document, and provide log at future appointments -Recommended to continue current medication  Hyperlipidemia: (LDL goal < 100) -Controlled - LDL is at goal -Current treatment: . Pravastatin 40 mg daily -Educated on Cholesterol goals;  Benefits of statin for ASCVD risk reduction; -Recommended to continue current medication  Atrial Fibrillation (Goal: prevent stroke and major bleeding) -Controlled -CHADSVASC: 6 -hx 2nd degree AV block s/p PPM 08/2019 -Current treatment: . Rate control: metoprolol tartrate 25 mg daily . Anticoagulation: Eliquis 2.5 mg BID -Medications previously tried: none -Home BP and HR readings: 137/46, 60  -Counseled on increased risk of stroke due to Afib and benefits of anticoagulation for stroke prevention; importance  of adherence to anticoagulant exactly as prescribed; bleeding risk  associated with Eliquis and importance of self-monitoring for signs/symptoms of bleeding; seeking medical attention after a head injury or if there is blood in the urine/stool; -Recommended to continue current medication  COPD (Goal: control symptoms and prevent exacerbations) -Controlled - pt has severe COPD but has been relatively controlled over last several months and overall improved since starting daily predinosone -hx Stage 1 SCLC -Pulmonary function testing: 05/21/2019 (FEV1 51% predicted, FEV1/FVC 0.53) -Gold Grade: Gold 2 (FEV1 50-79%) -Current COPD Classification:  D (high sx, >/=2 exacerbations/yr) -MMRC/CAT score: 22 (06/2020) -Exacerbations requiring treatment in last 6 months: 1 -Current treatment  . Bevespi (glycopyrrolate-formoterol) 2 puffs BID . Ipratropium 0.02% nebulizer soln . Prednisone 10 mg daily . Oxygen 3-4 L -Patient reports consistent use of maintenance inhaler -Frequency of rescue inhaler use: infrequent -Counseled on Proper inhaler technique; Benefits of consistent maintenance inhaler use -Recommended to continue current medication  Depression/Anxiety (Goal: manage symptoms) -Controlled - pt reports mood improved since starting sertraline, but still down at times; she reports she cannot sleep without clonazepam, but does sleep very well overall -Current treatment: . Sertraline 50 mg daily . Clonazepam 0.5 mg HS  . Donepezil 10 mg HS -PHQ9: 0 (06/2020) -GAD7: not on file -Connected with PCP for mental health support -Counseled on ability to increase sertraline for further benefit if needed; pt deferred today -Recommended to continue current medication  Osteoporosis (Goal prevent fractures) -Controlled - pt has history of compression fractures, hip fracture 4 months ago; healing well from hip replacement -Last DEXA Scan: 04/08/20   T-Score femoral neck: -2.9  T-Score lumbar spine:  -0.9 -Patient is a candidate for pharmacologic treatment due to T-Score < -2.5 in femoral neck -Current treatment  . Prolia q6 months (last given 06/30/2020) . Calcium-Mg-Vitamin D - 2 daily -Recommend [phone] units of vitamin D daily. Recommend 1200 mg of calcium daily from dietary and supplemental sources. -Recommended to continue current medication  Hypothyroidism (Goal: maintain TSH in target range) -Controlled - pt takes levothyroxine with other AM meds; TSH has been at goal -Current treatment  . Levothyroxine 125 mcg daily -Counseled on TSH target and benefits of levothyroxine; no need to change administration since TSH is at goal -Recommended to continue current medication  GERD / dysphagia (Goal: manage symptoms) -Controlled -Current treatment  . Pantoprazole 40 mg BID -Patient is satisfied with current regimen and denies issues -Recommended to continue current medication  Chronic pain (Goal: manage pain) -Peripheral neuropathy, osteoarthritis, hx compression fracture -Not ideally controlled - pt currently does not take Tylenol very often but does have pain every day -Current treatment  . Gabapentin 300 mg - 1 AM, 1 PM, 2 HS . Tramadol 50 mg q8h PRN  . Tylenol 325 mg PRN - Tylenol  -Counseled on benefits of Tylenol for arthritis pain;  -Recommended to take Tylenol regularly for pain, up to 3000 mg/day  Health Maintenance -Vaccine gaps: shingrix -Current therapy:  . Docusate 100 mg PRN . Ferrous sulfate 325 mg QOD . Vitamin B12 1000 mcg daily . Klor-Con 10 mEq QOD w/ lasix . Preservision Areds 2 BID -Patient is satisfied with current therapy and denies issues -Recommended to continue current medication  Patient Goals/Self-Care Activities . Patient will:  - take medications as prescribed focus on medication adherence by pill box consider higher dose of sertraline if needed for mood  Follow Up Plan: Telephone follow up appointment with care management team  member scheduled for: 6 months      Medication Assistance:  -Eliquis PAP (  BMS) initially denied since patient has not met ~$950 OOP requirement. Pt-reported household income is < $30,000 annually so she does qualify based in income requirement. Faxed BMS Medicare estimated 2022 drug costs (>$2000) to see if this helps her case.  Patient's preferred pharmacy is:  [facility] [place], [place] - [address] [place] [address] Phone: [phone] Fax: [phone]  [id number] [place] [id number]: [phone]: [phone] Uses pill box? Yes - daughter sets up for her Pt endorses 100% compliance  We discussed: Current pharmacy is preferred with insurance plan and patient is satisfied with pharmacy services Patient decided to: Continue current medication management strategy  Care Plan and Follow Up Patient Decision:  Patient agrees to Care Plan and Follow-up.  Plan: Telephone follow up appointment with care management team member scheduled for:  6 months  LCharlene Brooke PharmD, BEtna CPP Clinical Pharmacist [phone]

## 2020-11-02 NOTE — Patient Instructions (Addendum)
Visit Information  Phone number for Pharmacist: [phone]  Thank you for meeting with me to discuss your medications! I look forward to working with you to achieve your health care goals. Below is a summary of what we talked about during the visit:  Goals Addressed            This Visit's Progress   . Manage My Medicine       Timeframe:  Long-Range Goal Priority:  Medium Start Date:     11/02/20                        Expected End Date:  11/02/21                      Follow Up Date 05/15/21   - call for medicine refill 2 or 3 days before it runs out - call if I am sick and can't take my medicine - keep a list of all the medicines I take; vitamins and herbals too - use a pillbox to sort medicine  -Consider higher dose of sertraline if needed for mood   Why is this important?   . These steps will help you keep on track with your medicines.   Notes:       Patient Care Plan: CCM Pharmacy Care Plan    Problem Identified: Hypertension, Hyperlipidemia, Atrial Fibrillation, Heart Failure, COPD, Hypothyroidism, Depression, Osteoporosis and Osteoarthritis   Priority: High    Long-Range Goal: Disease management   Start Date: 11/01/2020  Expected End Date: 11/02/2021  This Visit's Progress: On track  Priority: High  Note:   Current Barriers:  . Unable to independently afford treatment regimen . Unable to independently monitor therapeutic efficacy  Pharmacist Clinical Goal(s):  Marland Kitchen Patient will verbalize ability to afford treatment regimen . achieve adherence to monitoring guidelines and medication adherence to achieve therapeutic efficacy through collaboration with PharmD and provider.   Interventions: . 1:1 collaboration with Binnie Rail, MD regarding development and update of comprehensive plan of care as evidenced by provider attestation and co-signature . Inter-disciplinary care team collaboration (see longitudinal plan of care) . Comprehensive medication review  performed; medication list updated in electronic medical record  Hypertension (BP goal <130/80) -Controlled - BP in office is at goal; pt does not often check BP at home -Current treatment: . Metoprolol tartrate 25 mg daily . Furosemide 40 mg MWF -Reports dizziness since recent fall (CT scan normal) -Educated on BP goals and benefits of medications for prevention of heart attack, stroke and kidney damage; -Counseled to monitor BP at home PRN, document, and provide log at future appointments -Recommended to continue current medication  Hyperlipidemia: (LDL goal < 100) -Controlled - LDL is at goal -Current treatment: . Pravastatin 40 mg daily -Educated on Cholesterol goals;  Benefits of statin for ASCVD risk reduction; -Recommended to continue current medication  Atrial Fibrillation (Goal: prevent stroke and major bleeding) -Controlled -CHADSVASC: 6 -hx 2nd degree AV block s/p PPM 08/2019 -Current treatment: . Rate control: metoprolol tartrate 25 mg daily . Anticoagulation: Eliquis 2.5 mg BID -Medications previously tried: none -Home BP and HR readings: 137/46, 60  -Counseled on increased risk of stroke due to Afib and benefits of anticoagulation for stroke prevention; importance of adherence to anticoagulant exactly as prescribed; bleeding risk associated with Eliquis and importance of self-monitoring for signs/symptoms of bleeding; seeking medical attention after a head injury or if there is blood in the urine/stool; -Recommended to  continue current medication  COPD (Goal: control symptoms and prevent exacerbations) -Controlled - pt has severe COPD but has been relatively controlled over last several months and overall improved since starting daily predinosone -hx Stage 1 SCLC -Pulmonary function testing: 05/21/2019 (FEV1 51% predicted, FEV1/FVC 0.53) -Gold Grade: Gold 2 (FEV1 50-79%) -Current COPD Classification:  D (high sx, >/=2 exacerbations/yr) -MMRC/CAT score: 22  (06/2020) -Exacerbations requiring treatment in last 6 months: 1 -Current treatment  . Bevespi (glycopyrrolate-formoterol) 2 puffs BID . Ipratropium 0.02% nebulizer soln . Prednisone 10 mg daily . Oxygen 3-4 L -Patient reports consistent use of maintenance inhaler -Frequency of rescue inhaler use: infrequent -Counseled on Proper inhaler technique; Benefits of consistent maintenance inhaler use -Recommended to continue current medication  Depression/Anxiety (Goal: manage symptoms) -Controlled - pt reports mood improved since starting sertraline, but still down at times; she reports she cannot sleep without clonazepam, but does sleep very well overall -Current treatment: . Sertraline 50 mg daily . Clonazepam 0.5 mg HS  . Donepezil 10 mg HS -PHQ9: 0 (06/2020) -GAD7: not on file -Connected with PCP for mental health support -Counseled on ability to increase sertraline for further benefit if needed; pt deferred today -Recommended to continue current medication  Osteoporosis (Goal prevent fractures) -Controlled - pt has history of compression fractures, hip fracture 4 months ago; healing well from hip replacement -Last DEXA Scan: 04/08/20   T-Score femoral neck: -2.9  T-Score lumbar spine: -0.9 -Patient is a candidate for pharmacologic treatment due to T-Score < -2.5 in femoral neck -Current treatment  . Prolia q6 months (last given 06/30/2020) . Calcium-Mg-Vitamin D - 2 daily -Recommend [phone] units of vitamin D daily. Recommend 1200 mg of calcium daily from dietary and supplemental sources. -Recommended to continue current medication  Hypothyroidism (Goal: maintain TSH in target range) -Controlled - pt takes levothyroxine with other AM meds; TSH has been at goal -Current treatment  . Levothyroxine 125 mcg daily -Counseled on TSH target and benefits of levothyroxine; no need to change administration since TSH is at goal -Recommended to continue current medication  GERD /  dysphagia (Goal: manage symptoms) -Controlled -Current treatment  . Pantoprazole 40 mg BID -Patient is satisfied with current regimen and denies issues -Recommended to continue current medication  Chronic pain (Goal: manage pain) -Peripheral neuropathy, osteoarthritis, hx compression fracture -Not ideally controlled - pt currently does not take Tylenol very often but does have pain every day -Current treatment  . Gabapentin 300 mg - 1 AM, 1 PM, 2 HS . Tramadol 50 mg q8h PRN  . Tylenol 325 mg PRN - Tylenol  -Counseled on benefits of Tylenol for arthritis pain;  -Recommended to take Tylenol regularly for pain, up to 3000 mg/day  Health Maintenance -Vaccine gaps: shingrix -Current therapy:  . Docusate 100 mg PRN . Ferrous sulfate 325 mg QOD . Vitamin B12 1000 mcg daily . Klor-Con 10 mEq QOD w/ lasix . Preservision Areds 2 BID -Patient is satisfied with current therapy and denies issues -Recommended to continue current medication  Patient Goals/Self-Care Activities . Patient will:  - take medications as prescribed focus on medication adherence by pill box consider higher dose of sertraline if needed for mood  Follow Up Plan: Telephone follow up appointment with care management team member scheduled for: 6 months      Brandi Raymond was given information about Chronic Care Management services today including:  1. CCM service includes personalized support from designated clinical staff supervised by her physician, including individualized plan of care and  coordination with other care providers 2. 24/7 contact phone numbers for assistance for urgent and routine care needs. 3. Standard insurance, coinsurance, copays and deductibles apply for chronic care management only during months in which we provide at least 20 minutes of these services. Most insurances cover these services at 100%, however patients may be responsible for any copay, coinsurance and/or deductible if applicable. This  service may help you avoid the need for more expensive face-to-face services. 4. Only one practitioner may furnish and bill the service in a calendar month. 5. The patient may stop CCM services at any time (effective at the end of the month) by phone call to the office staff.  Patient agreed to services and verbal consent obtained.   Patient verbalizes understanding of instructions provided today and agrees to view in [place].  Telephone follow up appointment with pharmacy team member scheduled for: 6 months  Charlene Brooke, PharmD, BCACP, CPP Clinical Pharmacist [facility] [place] protect organs, store calcium, anchor muscles, and support the whole body. Keeping your bones strong is important, especially as you get older. You can take actions to help keep your bones strong and healthy. Why is keeping my bones healthy important? Keeping your bones healthy is important because your body constantly replaces bone cells. Cells get old, and new cells take their place. As we age, we lose bone cells because the body may not be able to make enough new cells to replace the old cells. The amount of bone cells and bone tissue you have is referred to as bone mass. The higher your bone mass, the stronger your bones. The aging process leads to an overall loss of bone mass in the body, which can increase the likelihood of:  Joint pain and stiffness.  Broken bones.  A condition in which the bones become weak and brittle (osteoporosis). A large decline in bone mass occurs in older adults. In women, it occurs about the time of menopause.   What actions can I take to keep my bones healthy? Good health habits are important for maintaining healthy bones. This includes eating nutritious foods and exercising regularly. To have healthy bones, you need to get enough of the right minerals and vitamins. Most nutrition experts recommend getting these nutrients from  the foods that you eat. In some cases, taking supplements may also be recommended. Doing certain types of exercise is also important for bone health. What are the nutritional recommendations for healthy bones? Eating a well-balanced diet with plenty of calcium and vitamin D will help to protect your bones. Nutritional recommendations vary from person to person. Ask your health care provider what is healthy for you. Here are some general guidelines. Get enough calcium Calcium is the most important (essential) mineral for bone health. Most people can get enough calcium from their diet, but supplements may be recommended for people who are at risk for osteoporosis. Good sources of calcium include:  Dairy products, such as low-fat or nonfat milk, cheese, and yogurt.  Dark green leafy vegetables, such as bok choy and broccoli.  Calcium-fortified foods, such as orange juice, cereal, bread, soy beverages, and tofu products.  Nuts, such as almonds. Follow these recommended amounts for daily calcium intake:  Children, age 33-3: 700 mg.  Children, age 40-8: 1,000 mg.  Children, age 24-13: 1,300 mg.  Teens, age 58-18: 1,300 mg.  Adults, age 57-50: 1,000 mg.  Adults, age 65-70: ? Men: 1,000 mg. ? Women: 1,200 mg.  Adults, age 76 or older: 1,200 mg.  Pregnant and breastfeeding females: ? Teens: 1,300 mg. ? Adults: 1,000 mg. Get enough vitamin D Vitamin D is the most essential vitamin for bone health. It helps the body absorb calcium. Sunlight stimulates the skin to make vitamin D, so be sure to get enough sunlight. If you live in a cold climate or you do not get outside often, your health care provider may recommend that you take vitamin D supplements. Good sources of vitamin D in your diet include:  Egg yolks.  Saltwater fish.  Milk and cereal fortified with vitamin D. Follow these recommended amounts for daily vitamin D intake:  Children and teens, age 90-18: 600 international  units.  Adults, age 22 or younger: 400-800 international units.  Adults, age 32 or older: 800-1,000 international units. Get other important nutrients Other nutrients that are important for bone health include:  Phosphorus. This mineral is found in meat, poultry, dairy foods, nuts, and legumes. The recommended daily intake for adult men and adult women is 700 mg.  Magnesium. This mineral is found in seeds, nuts, dark green vegetables, and legumes. The recommended daily intake for adult men is 400-420 mg. For adult women, it is 310-320 mg.  Vitamin K. This vitamin is found in green leafy vegetables. The recommended daily intake is 120 mg for adult men and 90 mg for adult women.   What type of physical activity is best for building and maintaining healthy bones? Weight-bearing and strength-building activities are important for building and maintaining healthy bones. Weight-bearing activities cause muscles and bones to work against gravity. Strength-building activities increase the strength of the muscles that support bones. Weight-bearing and muscle-building activities include:  Walking and hiking.  Jogging and running.  Dancing.  Gym exercises.  Lifting weights.  Tennis and racquetball.  Climbing stairs.  Aerobics. Adults should get at least 30 minutes of moderate physical activity on most days. Children should get at least 60 minutes of moderate physical activity on most days. Ask your health care provider what type of exercise is best for you.   How can I find out if my bone mass is low? Bone mass can be measured with an X-ray test called a bone mineral density (BMD) test. This test is recommended for all women who are age 23 or older. It may also be recommended for:  Men who are age 40 or older.  People who are at risk for osteoporosis because of: ? Having bones that break easily. ? Having a long-term disease that weakens bones, such as kidney disease or rheumatoid  arthritis. ? Having menopause earlier than normal. ? Taking medicine that weakens bones, such as steroids, thyroid hormones, or hormone treatment for breast cancer or prostate cancer. ? Smoking. ? Drinking three or more alcoholic drinks a day. If you find that you have a low bone mass, you may be able to prevent osteoporosis or further bone loss by changing your diet and lifestyle. Where can I find more information? For more information, check out the following websites:  [place]: AviationTales.fr  [employer] of Health: www.bones.SouthExposed.es  International Osteoporosis Foundation: Administrator.iofbonehealth.org Summary  The aging process leads to an overall loss of bone mass in the body, which can increase the likelihood of broken bones and osteoporosis.  Eating a well-balanced diet with plenty of calcium and vitamin D will help to protect your bones.  Weight-bearing and strength-building activities are also important for building and maintaining strong bones.  Bone  mass can be measured with an X-ray test called a bone mineral density (BMD) test. This information is not intended to replace advice given to you by your health care provider. Make sure you discuss any questions you have with your health care provider. Document Revised: 07/30/2017 Document Reviewed: 07/30/2017 Elsevier Patient Education  2021 [employer].

## 2020-11-05 ENCOUNTER — Encounter: Payer: Self-pay | Admitting: Internal Medicine

## 2020-11-05 MED ORDER — CLONAZEPAM 0.5 MG PO TABS: 0.5000 mg | ORAL_TABLET | Freq: Every day | ORAL | 3 refills | Status: DC

## 2020-11-08 DIAGNOSIS — J441 Chronic obstructive pulmonary disease with (acute) exacerbation: Secondary | ICD-10-CM | POA: Diagnosis not present

## 2020-11-09 ENCOUNTER — Other Ambulatory Visit: Payer: Self-pay

## 2020-11-09 MED ORDER — ELIQUIS 5 MG PO TABS: 5.0000 mg | ORAL_TABLET | Freq: Two times a day (BID) | ORAL | 3 refills | Status: AC

## 2020-11-09 NOTE — Telephone Encounter (Signed)
Pt requesting 2.5 mg eliquis but qualifies for 5mg . Pt also has an advice request about patient assistance. This must be reviewed by pharmd for dose change. 22f, 63kg, scr 0.76 07/21/20, lovw/tillery 03/08/20

## 2020-11-12 ENCOUNTER — Ambulatory Visit (INDEPENDENT_AMBULATORY_CARE_PROVIDER_SITE_OTHER): Payer: PPO

## 2020-11-12 DIAGNOSIS — Z0000 Encounter for general adult medical examination without abnormal findings: Secondary | ICD-10-CM

## 2020-11-12 DIAGNOSIS — J9611 Chronic respiratory failure with hypoxia: Secondary | ICD-10-CM | POA: Diagnosis not present

## 2020-11-12 DIAGNOSIS — C3491 Malignant neoplasm of unspecified part of right bronchus or lung: Secondary | ICD-10-CM | POA: Diagnosis not present

## 2020-11-12 DIAGNOSIS — I5031 Acute diastolic (congestive) heart failure: Secondary | ICD-10-CM | POA: Diagnosis not present

## 2020-11-12 DIAGNOSIS — J441 Chronic obstructive pulmonary disease with (acute) exacerbation: Secondary | ICD-10-CM | POA: Diagnosis not present

## 2020-11-12 DIAGNOSIS — J439 Emphysema, unspecified: Secondary | ICD-10-CM | POA: Diagnosis not present
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
## 2020-11-12 NOTE — Patient Instructions (Signed)
Brandi Raymond , Thank you for taking time to come for your Medicare Wellness Visit. I appreciate your ongoing commitment to your health goals. Please review the following plan we discussed and let me know if I can assist you in the future.   Screening recommendations/referrals: Colonoscopy: no repeat due to age 87: patient refused Bone Density: 04/08/2020; due every 2 years Recommended yearly ophthalmology/optometry visit for glaucoma screening and checkup Recommended yearly dental visit for hygiene and checkup  Vaccinations: Influenza vaccine: 06/30/2020 Pneumococcal vaccine: 08/13/2015, 12/06/2016 Tdap vaccine: 11/13/2012; due every 10 years Shingles vaccine: never done   Covid-19: 10/25/2019 (J&J)  Advanced directives: Please bring a copy of your health care power of attorney and living will to the office at your convenience.  Conditions/risks identified: Yes. Reviewed health maintenance screenings with patient today and relevant education, vaccines, and/or referrals were provided. Continue doing brain stimulating activities (puzzles, reading, adult coloring books, staying active) to keep memory sharp. Continue to eat heart healthy diet (full of fruits, vegetables, whole grains, lean protein, water--limit salt, fat, and sugar intake) and increase physical activity as tolerated.  Next appointment: Please schedule your next Medicare Wellness Visit with your Nurse Health Advisor in 1 year by calling [phone].  Preventive Care 79 Years and Older, Female Preventive care refers to lifestyle choices and visits with your health care provider that can promote health and wellness. What does preventive care include?  A yearly physical exam. This is also called an annual well check.  Dental exams once or twice a year.  Routine eye exams. Ask your health care provider how often you should have your eyes checked.  Personal lifestyle choices, including:  Daily care of your teeth and  gums.  Regular physical activity.  Eating a healthy diet.  Avoiding tobacco and drug use.  Limiting alcohol use.  Practicing safe sex.  Taking low-dose aspirin every day.  Taking vitamin and mineral supplements as recommended by your health care provider. What happens during an annual well check? The services and screenings done by your health care provider during your annual well check will depend on your age, overall health, lifestyle risk factors, and family history of disease. Counseling  Your health care provider may ask you questions about your:  Alcohol use.  Tobacco use.  Drug use.  Emotional well-being.  Home and relationship well-being.  Sexual activity.  Eating habits.  History of falls.  Memory and ability to understand (cognition).  Work and work Statistician.  Reproductive health. Screening  You may have the following tests or measurements:  Height, weight, and BMI.  Blood pressure.  Lipid and cholesterol levels. These may be checked every 5 years, or more frequently if you are over 68 years old.  Skin check.  Lung cancer screening. You may have this screening every year starting at age 69 if you have a 30-pack-year history of smoking and currently smoke or have quit within the past 15 years.  Fecal occult blood test (FOBT) of the stool. You may have this test every year starting at age 51.  Flexible sigmoidoscopy or colonoscopy. You may have a sigmoidoscopy every 5 years or a colonoscopy every 10 years starting at age 25.  Hepatitis C blood test.  Hepatitis B blood test.  Sexually transmitted disease (STD) testing.  Diabetes screening. This is done by checking your blood sugar (glucose) after you have not eaten for a while (fasting). You may have this done every 1-3 years.  Bone density scan. This is done to  screen for osteoporosis. You may have this done starting at age 58.  Mammogram. This may be done every 1-2 years. Talk to your  health care provider about how often you should have regular mammograms. Talk with your health care provider about your test results, treatment options, and if necessary, the need for more tests. Vaccines  Your health care provider may recommend certain vaccines, such as:  Influenza vaccine. This is recommended every year.  Tetanus, diphtheria, and acellular pertussis (Tdap, Td) vaccine. You may need a Td booster every 10 years.  Zoster vaccine. You may need this after age 82.  Pneumococcal 13-valent conjugate (PCV13) vaccine. One dose is recommended after age 22.  Pneumococcal polysaccharide (PPSV23) vaccine. One dose is recommended after age 97. Talk to your health care provider about which screenings and vaccines you need and how often you need them. This information is not intended to replace advice given to you by your health care provider. Make sure you discuss any questions you have with your health care provider. Document Released: 07/30/2015 Document Revised: 03/22/2016 Document Reviewed: 05/04/2015 Elsevier Interactive Patient Education  2017 [employer] Prevention in the Home Falls can cause injuries. They can happen to people of all ages. There are many things you can do to make your home safe and to help prevent falls. What can I do on the outside of my home?  Regularly fix the edges of walkways and driveways and fix any cracks.  Remove anything that might make you trip as you walk through a door, such as a raised step or threshold.  Trim any bushes or trees on the path to your home.  Use bright outdoor lighting.  Clear any walking paths of anything that might make someone trip, such as rocks or tools.  Regularly check to see if handrails are loose or broken. Make sure that both sides of any steps have handrails.  Any raised decks and porches should have guardrails on the edges.  Have any leaves, snow, or ice cleared regularly.  Use sand or salt on walking  paths during winter.  Clean up any spills in your garage right away. This includes oil or grease spills. What can I do in the bathroom?  Use night lights.  Install grab bars by the toilet and in the tub and shower. Do not use towel bars as grab bars.  Use non-skid mats or decals in the tub or shower.  If you need to sit down in the shower, use a plastic, non-slip stool.  Keep the floor dry. Clean up any water that spills on the floor as soon as it happens.  Remove soap buildup in the tub or shower regularly.  Attach bath mats securely with double-sided non-slip rug tape.  Do not have throw rugs and other things on the floor that can make you trip. What can I do in the bedroom?  Use night lights.  Make sure that you have a light by your bed that is easy to reach.  Do not use any sheets or blankets that are too big for your bed. They should not hang down onto the floor.  Have a firm chair that has side arms. You can use this for support while you get dressed.  Do not have throw rugs and other things on the floor that can make you trip. What can I do in the kitchen?  Clean up any spills right away.  Avoid walking on wet floors.  Keep items that  you use a lot in easy-to-reach places.  If you need to reach something above you, use a strong step stool that has a grab bar.  Keep electrical cords out of the way.  Do not use floor polish or wax that makes floors slippery. If you must use wax, use non-skid floor wax.  Do not have throw rugs and other things on the floor that can make you trip. What can I do with my stairs?  Do not leave any items on the stairs.  Make sure that there are handrails on both sides of the stairs and use them. Fix handrails that are broken or loose. Make sure that handrails are as long as the stairways.  Check any carpeting to make sure that it is firmly attached to the stairs. Fix any carpet that is loose or worn.  Avoid having throw rugs at  the top or bottom of the stairs. If you do have throw rugs, attach them to the floor with carpet tape.  Make sure that you have a light switch at the top of the stairs and the bottom of the stairs. If you do not have them, ask someone to add them for you. What else can I do to help prevent falls?  Wear shoes that:  Do not have high heels.  Have rubber bottoms.  Are comfortable and fit you well.  Are closed at the toe. Do not wear sandals.  If you use a stepladder:  Make sure that it is fully opened. Do not climb a closed stepladder.  Make sure that both sides of the stepladder are locked into place.  Ask someone to hold it for you, if possible.  Clearly mark and make sure that you can see:  Any grab bars or handrails.  First and last steps.  Where the edge of each step is.  Use tools that help you move around (mobility aids) if they are needed. These include:  Canes.  Walkers.  Scooters.  Crutches.  Turn on the lights when you go into a dark area. Replace any light bulbs as soon as they burn out.  Set up your furniture so you have a clear path. Avoid moving your furniture around.  If any of your floors are uneven, fix them.  If there are any pets around you, be aware of where they are.  Review your medicines with your doctor. Some medicines can make you feel dizzy. This can increase your chance of falling. Ask your doctor what other things that you can do to help prevent falls. This information is not intended to replace advice given to you by your health care provider. Make sure you discuss any questions you have with your health care provider. Document Released: 04/29/2009 Document Revised: 12/09/2015 Document Reviewed: 08/07/2014 Elsevier Interactive Patient Education  2017 [employer].

## 2020-11-22 ENCOUNTER — Ambulatory Visit (INDEPENDENT_AMBULATORY_CARE_PROVIDER_SITE_OTHER): Payer: PPO

## 2020-11-22 ENCOUNTER — Ambulatory Visit (HOSPITAL_COMMUNITY)
Admission: RE | Admit: 2020-11-22 | Discharge: 2020-11-22 | Disposition: A | Discharge status: Home / Self Care | Payer: PPO | Source: Ambulatory Visit | Attending: Physician Assistant | Admitting: Physician Assistant

## 2020-11-22 ENCOUNTER — Other Ambulatory Visit: Payer: Self-pay

## 2020-11-22 ENCOUNTER — Inpatient Hospital Stay: Payer: PPO | Attending: Internal Medicine

## 2020-11-22 ENCOUNTER — Encounter (HOSPITAL_COMMUNITY): Payer: Self-pay

## 2020-11-22 DIAGNOSIS — Z87891 Personal history of nicotine dependence: Secondary | ICD-10-CM | POA: Insufficient documentation

## 2020-11-22 DIAGNOSIS — R0602 Shortness of breath: Secondary | ICD-10-CM | POA: Insufficient documentation

## 2020-11-22 DIAGNOSIS — J432 Centrilobular emphysema: Secondary | ICD-10-CM | POA: Diagnosis not present

## 2020-11-22 DIAGNOSIS — E034 Atrophy of thyroid (acquired): Secondary | ICD-10-CM | POA: Diagnosis not present

## 2020-11-22 DIAGNOSIS — J8410 Pulmonary fibrosis, unspecified: Secondary | ICD-10-CM | POA: Diagnosis not present

## 2020-11-22 DIAGNOSIS — C3491 Malignant neoplasm of unspecified part of right bronchus or lung: Secondary | ICD-10-CM | POA: Diagnosis not present

## 2020-11-22 DIAGNOSIS — I441 Atrioventricular block, second degree: Secondary | ICD-10-CM

## 2020-11-22 DIAGNOSIS — C3431 Malignant neoplasm of lower lobe, right bronchus or lung: Secondary | ICD-10-CM | POA: Insufficient documentation

## 2020-11-22 DIAGNOSIS — I2510 Atherosclerotic heart disease of native coronary artery without angina pectoris: Secondary | ICD-10-CM | POA: Diagnosis not present

## 2020-11-22 DIAGNOSIS — Z9981 Dependence on supplemental oxygen: Secondary | ICD-10-CM | POA: Diagnosis not present

## 2020-11-22 DIAGNOSIS — K449 Diaphragmatic hernia without obstruction or gangrene: Secondary | ICD-10-CM | POA: Diagnosis not present

## 2020-11-22 HISTORY — DX: Heart failure, unspecified: I50.9

## 2020-11-22 LAB — CBC WITH DIFFERENTIAL (CANCER CENTER ONLY)
Abs Immature Granulocytes: 0.15 10*3/uL — ABNORMAL HIGH (ref 0.00–0.07)
Basophils Absolute: 0.1 10*3/uL (ref 0.0–0.1)
Basophils Relative: 1 %
Eosinophils Absolute: 0.3 10*3/uL (ref 0.0–0.5)
Eosinophils Relative: 3 %
HCT: 36.7 % (ref 36.0–46.0)
Hemoglobin: 10.8 g/dL — ABNORMAL LOW (ref 12.0–15.0)
Immature Granulocytes: 2 %
Lymphocytes Relative: 25 %
Lymphs Abs: 2.1 10*3/uL (ref 0.7–4.0)
MCH: 27.2 pg (ref 26.0–34.0)
MCHC: 29.4 g/dL — ABNORMAL LOW (ref 30.0–36.0)
MCV: 92.4 fL (ref 80.0–100.0)
Monocytes Absolute: 1 10*3/uL (ref 0.1–1.0)
Monocytes Relative: 12 %
Neutro Abs: 5 10*3/uL (ref 1.7–7.7)
Neutrophils Relative %: 57 %
Platelet Count: 134 10*3/uL — ABNORMAL LOW (ref 150–400)
RBC: 3.97 MIL/uL (ref 3.87–5.11)
RDW: 14.6 % (ref 11.5–15.5)
WBC Count: 8.6 10*3/uL (ref 4.0–10.5)
nRBC: 0 % (ref 0.0–0.2)

## 2020-11-22 LAB — CMP (CANCER CENTER ONLY)
ALT: 14 U/L (ref 0–44)
AST: 26 U/L (ref 15–41)
Albumin: 3.2 g/dL — ABNORMAL LOW (ref 3.5–5.0)
Alkaline Phosphatase: 45 U/L (ref 38–126)
Anion gap: 11 (ref 5–15)
BUN: 23 mg/dL (ref 8–23)
CO2: 30 mmol/L (ref 22–32)
Calcium: 8.5 mg/dL — ABNORMAL LOW (ref 8.9–10.3)
Chloride: 105 mmol/L (ref 98–111)
Creatinine: 1.02 mg/dL — ABNORMAL HIGH (ref 0.44–1.00)
GFR, Estimated: 54 mL/min — ABNORMAL LOW (ref >60)
Glucose, Bld: 92 mg/dL (ref 70–99)
Potassium: 4.1 mmol/L (ref 3.5–5.1)
Sodium: 146 mmol/L — ABNORMAL HIGH (ref 135–145)
Total Bilirubin: 0.3 mg/dL (ref 0.3–1.2)
Total Protein: 5.9 g/dL — ABNORMAL LOW (ref 6.5–8.1)

## 2020-11-22 MED ORDER — IOHEXOL 300 MG/ML  SOLN
75.0000 mL | Freq: Once | INTRAMUSCULAR | Status: AC | PRN
  Administered 2020-11-22: 75 mL via INTRAVENOUS

## 2020-11-22 MED ORDER — SODIUM CHLORIDE (PF) 0.9 % IJ SOLN
INTRAMUSCULAR | Status: AC
  Filled 2020-11-22: qty 50

## 2020-11-23 LAB — CUP PACEART REMOTE DEVICE CHECK
Battery Remaining Longevity: 131 mo
Battery Remaining Percentage: 95.5 %
Battery Voltage: 3.02 V
Brady Statistic AP VP Percent: 11 %
Brady Statistic AP VS Percent: 2.5 %
Brady Statistic AS VP Percent: 49 %
Brady Statistic AS VS Percent: 37 %
Brady Statistic RA Percent Paced: 13 %
Brady Statistic RV Percent Paced: 59 %
Date Time Interrogation Session: 20220509021126
Implantable Lead Implant Date: 20210204
Implantable Lead Implant Date: 20210204
Implantable Lead Location: 753859
Implantable Lead Location: 753860
Implantable Pulse Generator Implant Date: 20210204
Lead Channel Impedance Value: 410 Ohm
Lead Channel Impedance Value: 530 Ohm
Lead Channel Pacing Threshold Amplitude: 0.625 V
Lead Channel Pacing Threshold Amplitude: 0.75 V
Lead Channel Pacing Threshold Pulse Width: 0.4 ms
Lead Channel Pacing Threshold Pulse Width: 0.5 ms
Lead Channel Sensing Intrinsic Amplitude: 12 mV
Lead Channel Sensing Intrinsic Amplitude: 4.5 mV
Lead Channel Setting Pacing Amplitude: 1 V
Lead Channel Setting Pacing Amplitude: 1.625
Lead Channel Setting Pacing Pulse Width: 0.4 ms
Lead Channel Setting Sensing Sensitivity: 2.5 mV
Pulse Gen Model: 2272
Pulse Gen Serial Number: 9196859

## 2020-11-24 ENCOUNTER — Encounter: Payer: Self-pay | Admitting: Internal Medicine

## 2020-11-24 ENCOUNTER — Other Ambulatory Visit: Payer: Self-pay

## 2020-11-24 ENCOUNTER — Inpatient Hospital Stay (HOSPITAL_BASED_OUTPATIENT_CLINIC_OR_DEPARTMENT_OTHER): Payer: PPO | Admitting: Internal Medicine

## 2020-11-24 DIAGNOSIS — C3490 Malignant neoplasm of unspecified part of unspecified bronchus or lung: Secondary | ICD-10-CM

## 2020-11-24 DIAGNOSIS — C3431 Malignant neoplasm of lower lobe, right bronchus or lung: Secondary | ICD-10-CM | POA: Diagnosis not present
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
## 2020-11-25 ENCOUNTER — Telehealth: Payer: Self-pay | Admitting: Internal Medicine

## 2020-11-25 ENCOUNTER — Telehealth: Payer: Self-pay | Admitting: Pharmacist

## 2020-11-25 NOTE — Progress Notes (Signed)
    Chronic Care Management Pharmacy Assistant   Name: Brandi Raymond  MRN: [id number] DOB: 11-14-1933  Reason for Encounter: Patient Assistance Application   Called and spoke with Megan at [facility] about patient assistance application for Eliquis. Patient needs to send in out of pocket expenses for 2022. Called patient and left voicemail with this update.     Orinda Kenner, [place] Clinical Pharmacists Assistant [phone]  Time Spent: 12

## 2020-11-25 NOTE — Telephone Encounter (Signed)
Scheduled per los. Mailed printout  °

## 2020-11-30 ENCOUNTER — Telehealth: Payer: Self-pay | Admitting: Internal Medicine

## 2020-11-30 MED ORDER — AMOXICILLIN-POT CLAVULANATE 875-125 MG PO TABS
1.0000 | ORAL_TABLET | Freq: Two times a day (BID) | ORAL | 0 refills | Status: DC
Start: 2020-11-30 — End: 2020-12-28

## 2020-11-30 NOTE — Telephone Encounter (Signed)
Patients daughter calling back, they called 911 and EMS came and did several screenings and EMS said patient didn't seem to need to go to the ED. Patient refusing ED and just wants antibiotics. Please advise and call patients daughter back.

## 2020-11-30 NOTE — Telephone Encounter (Signed)
[facility] FYI:  ---Caller states her mother is slurring words and having difficulty moving. She was lightheaded and fell yesterday. She hit her head.  Advised to call EMS 911, caller states she refused to go to ER but she will speak to her about it. She just wants abx called in for the cough. Reinforced need to be seen in ER immed w/severe weakness requiring MAX asst to move, s/p head injury w/slurred speech

## 2020-11-30 NOTE — Telephone Encounter (Signed)
Patients daughter Laurine Blazer calling, states the patient is slurring her words, can barely move, needs help with everything and getting to the bathroom, and has a really bad cough. States they thought she had pneumonia and patients husband had a z-pack he never took so he started the patient on that medication. Patients daughter concerned because she thought patients with heart conditions cannot take z-packs. Transferred to [facility] for further evaluation.

## 2020-11-30 NOTE — Telephone Encounter (Signed)
Message left for daughter.

## 2020-11-30 NOTE — Telephone Encounter (Signed)
Lets try augmentin - if there is no improvement she needs to be evaluated further.   rx sent to pharmacy

## 2020-12-07 ENCOUNTER — Ambulatory Visit: Payer: PPO | Admitting: Orthopaedic Surgery

## 2020-12-07 ENCOUNTER — Ambulatory Visit (INDEPENDENT_AMBULATORY_CARE_PROVIDER_SITE_OTHER): Payer: PPO

## 2020-12-07 ENCOUNTER — Other Ambulatory Visit: Payer: Self-pay

## 2020-12-07 ENCOUNTER — Encounter: Payer: Self-pay | Admitting: Orthopaedic Surgery

## 2020-12-07 VITALS — Ht 64.0 in | Wt 136.0 lb

## 2020-12-07 DIAGNOSIS — M1711 Unilateral primary osteoarthritis, right knee: Secondary | ICD-10-CM | POA: Diagnosis not present

## 2020-12-07 DIAGNOSIS — M79672 Pain in left foot: Secondary | ICD-10-CM | POA: Diagnosis not present

## 2020-12-07 MED ORDER — METHYLPREDNISOLONE ACETATE 40 MG/ML IJ SUSP
40.0000 mg | INTRAMUSCULAR | Status: AC | PRN
  Administered 2020-12-07: 40 mg via INTRA_ARTICULAR

## 2020-12-07 MED ORDER — BUPIVACAINE HCL 0.25 % IJ SOLN
2.0000 mL | INTRAMUSCULAR | Status: AC | PRN
  Administered 2020-12-07: 2 mL via INTRA_ARTICULAR

## 2020-12-07 MED ORDER — LIDOCAINE HCL 1 % IJ SOLN
2.0000 mL | INTRAMUSCULAR | Status: AC | PRN
  Administered 2020-12-07: 2 mL
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
## 2020-12-12 DIAGNOSIS — C3491 Malignant neoplasm of unspecified part of right bronchus or lung: Secondary | ICD-10-CM | POA: Diagnosis not present

## 2020-12-12 DIAGNOSIS — J441 Chronic obstructive pulmonary disease with (acute) exacerbation: Secondary | ICD-10-CM | POA: Diagnosis not present

## 2020-12-12 DIAGNOSIS — J439 Emphysema, unspecified: Secondary | ICD-10-CM | POA: Diagnosis not present

## 2020-12-12 DIAGNOSIS — I5031 Acute diastolic (congestive) heart failure: Secondary | ICD-10-CM | POA: Diagnosis not present

## 2020-12-12 DIAGNOSIS — J9611 Chronic respiratory failure with hypoxia: Secondary | ICD-10-CM | POA: Diagnosis not present

## 2020-12-14 ENCOUNTER — Other Ambulatory Visit: Payer: Self-pay | Admitting: Student

## 2020-12-14 ENCOUNTER — Other Ambulatory Visit: Payer: Self-pay | Admitting: Internal Medicine

## 2020-12-14 NOTE — Progress Notes (Signed)
Remote pacemaker transmission.   

## 2020-12-16 ENCOUNTER — Telehealth: Payer: Self-pay | Admitting: Pharmacist

## 2020-12-16 ENCOUNTER — Telehealth: Payer: Self-pay | Admitting: Orthopaedic Surgery

## 2020-12-16 NOTE — Progress Notes (Addendum)
    Chronic Care Management Pharmacy Assistant   Name: Brandi Raymond  MRN: [id number] DOB: July 24, 1933  Called [facility] and spoke with [name], patient was approved for Eliquis on 5/31/2-12/31/22. Patient should receive shipment next week in the mail.  Orinda Kenner, [place] Clinical Pharmacists Assistant [phone]

## 2020-12-16 NOTE — Telephone Encounter (Signed)
Patient's daughter [name] called advised her mothers  foot is not getting any better. [name] said the boot did not help only made it worse. [name] asked if there are any other recommendations or options that Dr Erlinda Hong  can recommend for her mother?   The number to contact [name] is  [phone]

## 2020-12-16 NOTE — Telephone Encounter (Signed)
Just have her come back for repeat evaluation

## 2020-12-17 NOTE — Telephone Encounter (Signed)
Can you make her an appt please.

## 2020-12-20 NOTE — Progress Notes (Addendum)
UPDATE: Patient daughter returned called and said the Eliquis was delivered last week. Patient daughter was very happy and said thank you [name].  Orinda Kenner, RMA Clinical Pharmacists Assistant [phone]     Called patient and left detailed voice message about Eliquis approval and delivery.   Orinda Kenner, [place] Clinical Pharmacists Assistant [phone]

## 2020-12-21 NOTE — Addendum Note (Signed)
Addended byDoylene Bode on: 12/21/2020 07:37 PM   Modules accepted: Orders

## 2020-12-28 DIAGNOSIS — I700 Atherosclerosis of aorta: Secondary | ICD-10-CM | POA: Insufficient documentation

## 2020-12-28 NOTE — Assessment & Plan Note (Addendum)
Chronic Continue pravastatin 40 mg daily  Lab Results  Component Value Date   LDLCALC 83 12/13/2017

## 2020-12-28 NOTE — Patient Instructions (Addendum)
   Medications changes include :   increase sertraline to 100 mg daily  Your prescription(s) have been submitted to your pharmacy. Please take as directed and contact our office if you believe you are having problem(s) with the medication(s).   I would recommend that you see a dermatologist.    Please followup in 6 months

## 2020-12-28 NOTE — Progress Notes (Signed)
Subjective:    Patient ID: Brandi Raymond, female    DOB: 09/15/1933, 87 y.o.   MRN: [id number]  HPI The patient is here for follow up of their chronic medical problems, including CAD, htn, OP on prolia, COPD on O2, lung cancer s/p radiation, hypothyroidism, prediabetes, hld, sleep d/o, depression, memory issues.  She is here with her daughter.  Her daughter notices that she has difficulty staying awake.  She has no energy to do anything.  She has been more sedentary.  She gets weak if she tries to do anything.  She does state that she is depressed and anxious.  Her breathing it worse  - she uses her bevespi twice daily - occ she will do it two extra times a day.     Medications and allergies reviewed with patient and updated if appropriate.  Patient Active Problem List   Diagnosis Date Noted   Aortic atherosclerosis ([place]) 12/28/2020   Wedge compression fracture of first lumbar vertebra, initial encounter for closed fracture ([place]) 08/19/2020   T12 compression fracture, with routine healing, subsequent encounter 08/10/2020   Chronic back pain 08/06/2020   Displaced fracture of left femoral neck ([place]) 07/17/2020   Closed left hip fracture ([place]) 07/15/2020   Goals of care, counseling/discussion 06/16/2020   Large hiatal hernia 04/07/2020   Dysphagia 04/07/2020   Pacemaker 03/16/2020   Chronic anticoagulation 03/16/2020   Encounter for medication review and counseling 03/15/2020   Gastritis with hemorrhage 03/04/2020   PNA (pneumonia) 02/12/2020   Right lower lobe pneumonia 01/13/2020   CAP (community acquired pneumonia) 01/11/2020   Acute respiratory failure ([place]) 17/49/4496   Acute diastolic CHF (congestive heart failure) ([place]) 09/02/2019   Paroxysmal atrial fibrillation (HCC)    Second degree Mobitz II AV block 08/21/2019   Second degree AV block 08/08/2019   SOB (shortness of breath) 08/05/2019   Chest tightness 08/05/2019   GERD (gastroesophageal reflux disease)  05/21/2019   Chronic respiratory failure with hypoxia ([place]) 05/21/2019   Medication management 05/21/2019   Stage I squamous cell carcinoma of right lung ([place]) 02/06/2019   Constipation 09/11/2018   COPD exacerbation ([place]) 09/04/2018   Right lower lobe lung mass 09/04/2018   Closed compression fracture of L1 lumbar vertebra, initial encounter ([place]) 09/04/2018   Preoperative clearance 07/19/2018   Lower back pain 06/25/2018   Cubital tunnel syndrome on left 05/22/2018   Dupuytren's contracture of both hands 05/10/2018   Primary osteoarthritis of both knees 05/10/2018   Difficulty urinating 05/10/2018   Osteoporosis 06/14/2017   Prediabetes 06/23/2015   Depression 06/23/2015   Carotid stenosis 10/12/2014   Sleep disorder 04/09/2014   Dizziness 01/20/2013   Mesenteric artery stenosis ([place]) 01/13/2013   Thoracic aneurysm without mention of rupture 11/04/2012   Chronic mesenteric ischemia ([place]) 10/08/2012   Memory deficit 06/20/2012   Irritable bowel syndrome 06/20/2012   Ocular myasthenia gravis ([place]) 06/20/2012   PVD (peripheral vascular disease) ([place]) 06/20/2012   Hereditary and idiopathic peripheral neuropathy 05/22/2012   Syncope 08/28/2011   Anemia 07/22/2010   ABDOMINAL BRUIT 08/17/2009   Hypothyroidism 05/26/2008   VITAMIN D DEFICIENCY 05/26/2008   CHOLELITHIASIS 12/06/2007   DIVERTICULOSIS, COLON 12/05/2007   HYPERLIPIDEMIA 08/19/2007   COPD (chronic obstructive pulmonary disease) with emphysema ([place]) 08/19/2007   CIGARETTE SMOKER 02/06/2007   Essential hypertension 02/06/2007   COLONIC POLYPS 11/21/2004    Current Outpatient Medications on File Prior to Visit  Medication Sig Dispense Refill   acetaminophen (TYLENOL) 325 MG tablet Take  1-2 tablets (325-650 mg total) by mouth every 6 (six) hours as needed for mild pain, moderate pain or fever. Total acetaminophen dose from all sources not to exceed 4 g/day.     apixaban (ELIQUIS) 5 MG TABS tablet Take 1 tablet (5 mg  total) by mouth 2 (two) times daily. 180 tablet 3   Calcium-Magnesium-Vitamin D (CALCIUM 1200+D3 PO) Take 1 tablet by mouth at bedtime.      clonazePAM (KLONOPIN) 0.5 MG tablet Take 1 tablet (0.5 mg total) by mouth at bedtime. 30 tablet 3   cyanocobalamin 1000 MCG tablet Take 1 tablet (1,000 mcg total) by mouth daily. 90 tablet 3   docusate sodium (COLACE) 100 MG capsule Take 100 mg by mouth at bedtime.      donepezil (ARICEPT) 10 MG tablet TAKE 1 TABLET BY MOUTH EVERYDAY AT BEDTIME (Patient taking differently: Take 10 mg by mouth at bedtime.) 90 tablet 2   ferrous sulfate 325 (65 FE) MG tablet Take 1 tablet (325 mg total) by mouth daily with breakfast.     furosemide (LASIX) 40 MG tablet Take 40 mg by mouth every other day.     gabapentin (NEURONTIN) 300 MG capsule TAKE ONE CAPSULE TWICE DAILY AND 2 AT NIGHT = FOUR TOTAL DAILY. 360 capsule 1   Glycopyrrolate-Formoterol (BEVESPI AEROSPHERE) 9-4.8 MCG/ACT AERO Inhale 2 puffs into the lungs in the morning and at bedtime. 32.1 g 1   ipratropium (ATROVENT) 0.02 % nebulizer solution Take 2.5 mLs (0.5 mg total) by nebulization every 6 (six) hours as needed for wheezing or shortness of breath. 75 mL 11   levothyroxine (SYNTHROID) 125 MCG tablet Take 1 tablet (125 mcg total) by mouth daily before breakfast. 90 tablet 1   metoprolol tartrate (LOPRESSOR) 25 MG tablet TAKE 1 TABLET BY MOUTH EVERY DAY (Patient taking differently: Take 25 mg by mouth daily.) 90 tablet 2   Multiple Vitamins-Minerals (PRESERVISION AREDS 2 PO) Take 1 capsule by mouth 2 (two) times daily.     pantoprazole (PROTONIX) 40 MG tablet Take 1 tablet (40 mg total) by mouth 2 (two) times daily before a meal. 180 tablet 1   potassium chloride (KLOR-CON) 10 MEQ tablet TAKE 1 TABLET BY MOUTH EVERY OTHER DAY WITH FUROSEMIDE 45 tablet 0   pravastatin (PRAVACHOL) 40 MG tablet TAKE 1 TABLET BY MOUTH DAILY 90 tablet 0   predniSONE (DELTASONE) 10 MG tablet TAKE 1 TABLET BY MOUTH DAILY WITH BREAKFAST  30 tablet 10   PROLIA 60 MG/ML SOSY injection Inject 60 mg into the skin every 6 (six) months.      sertraline (ZOLOFT) 50 MG tablet Take 1 tablet (50 mg total) by mouth daily. Start once off of cymbalta 30 tablet 5   traMADol (ULTRAM) 50 MG tablet Take 1 tablet (50 mg total) by mouth every 8 (eight) hours as needed for severe pain. For chronic back pain 90 tablet 0   No current facility-administered medications on file prior to visit.    Past Medical History:  Diagnosis Date   Adenomatous colon polyp    Arthritis    CAD (coronary artery disease)    PTCA of RCA    Carotid artery occlusion    Cervical spine fracture (HCC)    CHF (congestive heart failure) (HCC)    COPD (chronic obstructive pulmonary disease) ([place])    Depression    Diverticulosis    Gastritis 09/15/1991   GERD (gastroesophageal reflux disease) 09/15/1991   Dr Sharlett Iles   Hiatal hernia 09/15/1991  Hip fracture, left, closed, initial encounter ([place]) 07/16/2020   Hip fracture, right ([place])    HLD (hyperlipidemia)    HTN (hypertension)    Hyperplastic polyps of stomach 11/2007   colonoscopy   Hypertension    Hypothyroidism    affecting the left eye, proptosis   Iron deficiency anemia    Lung cancer ([place])    s/p XRT   Macular degeneration of left eye    Memory loss    Mesenteric artery stenosis ([place])    Myocardial infarction [facility]) 1993   Ocular myasthenia gravis ([place])    Dr Jannifer Franklin   Orthostatic hypotension 06/05/2013   PONV (postoperative nausea and vomiting)    Presence of permanent cardiac pacemaker 08/2019   Strabismus    left eye   Syncope 1998    Past Surgical History:  Procedure Laterality Date   ABDOMINAL AORTAGRAM N/A 10/15/2012   Procedure: ABDOMINAL Maxcine Ham;  Surgeon: Serafina Mitchell, MD;  Location: [facility] CATH LAB;  Service: Cardiovascular;  Laterality: N/A;   arm surgery Left    fx   BALLOON ANGIOPLASTY, [place]   LAD 20/50, CFX OK, RCA 30 at prev PTCA site, EF  with mild HK inferior wall   CAROTID ANGIOGRAM N/A 10/28/2014   Procedure: CAROTID ANGIOGRAM;  Surgeon: Serafina Mitchell, MD;  Location: [facility] CATH LAB;  Service: Cardiovascular;  Laterality: N/A;   CAROTID ENDARTERECTOMY     CATARACT EXTRACTION     bilateral   COLONOSCOPY W/ POLYPECTOMY  2006   Adenomatous polyps   ENDARTERECTOMY Left 01/14/2015   Procedure: LEFT CAROTID ENDARTERECTOMY ;  Surgeon: Serafina Mitchell, MD;  Location: [place];  Service: Vascular;  Laterality: Left;   ENDARTERECTOMY Right 08/12/2015   Procedure: ENDARTERECTOMY CAROTID WITH PATCH ANGIOPLASTY;  Surgeon: Serafina Mitchell, MD;  Location: [place];  Service: Vascular;  Laterality: Right;   EYE MUSCLE SURGERY Left 11/04/2015   EYE SURGERY Bilateral May 2016   Eyelids   FOOT SURGERY Left    LEG SURGERY Left    laceration   MIDDLE EAR SURGERY Left 1970   PACEMAKER IMPLANT N/A 08/21/2019   Procedure: PACEMAKER IMPLANT;  Surgeon: [name], MD;  Location: [place] CV LAB;  Service: Cardiovascular;  Laterality: N/A;   PERCUTANEOUS STENT INTERVENTION  12/03/2012   Procedure: PERCUTANEOUS STENT INTERVENTION;  Surgeon: Serafina Mitchell, MD;  Location: [facility] CATH LAB;  Service: Cardiovascular;;  sma stent x1   PERIPHERAL VASCULAR BALLOON ANGIOPLASTY  07/22/2019   Procedure: PERIPHERAL VASCULAR BALLOON ANGIOPLASTY;  Surgeon: Serafina Mitchell, MD;  Location: [place] CV LAB;  Service: Cardiovascular;;  Superior mesenteric   STRABISMUS SURGERY Left 10/28/2015   Procedure: REPAIR STRABISMUS LEFT EYE;  Surgeon: Lamonte Sakai, MD;  Location: [place];  Service: Ophthalmology;  Laterality: Left;   Third-degree [place] Fetters  2003   [facility]-legs ,buttocks,arms   TOTAL ABDOMINAL HYSTERECTOMY  1973   Dysfunctional menses   TOTAL HIP ARTHROPLASTY Left 07/17/2020   Procedure: HEMI HIP ARTHROPLASTY ANTERIOR APPROACH;  Surgeon: Leandrew Koyanagi, MD;  Location: [place];  Service: Orthopedics;  Laterality: Left;   UPPER GI ENDOSCOPY      Dr Joaquim Lai ANGIOGRAM N/A 10/15/2012   Procedure: VISCERAL ANGIOGRAM;  Surgeon: Serafina Mitchell, MD;  Location: [facility] CATH LAB;  Service: Cardiovascular;  Laterality: N/A;   VISCERAL ANGIOGRAM N/A 12/03/2012   Procedure: VISCERAL ANGIOGRAM;  Surgeon: Serafina Mitchell, MD;  Location: [facility]  CATH LAB;  Service: Cardiovascular;  Laterality: N/A;   VISCERAL ANGIOGRAM N/A 08/05/2013   Procedure: MESENTERIC ANGIOGRAM;  Surgeon: Serafina Mitchell, MD;  Location: [facility] CATH LAB;  Service: Cardiovascular;  Laterality: N/A;   VISCERAL ANGIOGRAM N/A 10/28/2014   Procedure: VISCERAL ANGIOGRAM;  Surgeon: Serafina Mitchell, MD;  Location: [facility] CATH LAB;  Service: Cardiovascular;  Laterality: N/A;   VISCERAL ANGIOGRAPHY N/A 07/22/2019   Procedure: MESENTERIC ANGIOGRAPHY;  Surgeon: Serafina Mitchell, MD;  Location: [place] CV LAB;  Service: Cardiovascular;  Laterality: N/A;    Social History   Socioeconomic History   Marital status: Married    Spouse name: Not on file   Number of children: 3   Years of education: 9th   Highest education level: Not on file  Occupational History   Occupation: Retired  Tobacco Use   Smoking status: Former    Packs/day: 1.00    Years: 60.00    Pack years: 60.00    Types: Cigarettes    Quit date: 07/2019    Years since quitting: 1.4   Smokeless tobacco: Never   Tobacco comments:    Successfully quit  Vaping Use   Vaping Use: Never used  Substance and Sexual Activity   Alcohol use: No    Alcohol/week: 0.0 standard drinks   Drug use: No   Sexual activity: Not Currently  Other Topics Concern   Not on file  Social History Narrative   Patient is right handed.  Lives in [place] with husband.   Patient drinks 3-4 cups of caffeine daily.   Social Determinants of Health   Financial Resource Strain: Low Risk    Difficulty of Paying Living Expenses: Not hard at all  Food Insecurity: No Food Insecurity   Worried About Charity fundraiser in the Last Year: Never true   [place]  in the Last Year: Never true  Transportation Needs: No Transportation Needs   Lack of Transportation (Medical): No   Lack of Transportation (Non-Medical): No  Physical Activity: Inactive   Days of Exercise per Week: 0 days   Minutes of Exercise per Session: 0 min  Stress: No Stress Concern Present   Feeling of Stress : Not at all  Social Connections: Not on file    Family History  Problem Relation Age of Onset   Throat cancer Mother        ? thyroid cancer   Cancer Mother    Emphysema Father    Diabetes Father    Heart attack Father 44   Colon cancer Brother    Cerebral aneurysm Brother    Hypothyroidism Sister        X2   Cancer Brother        Ear   Diabetes Paternal Grandmother    Diabetes Paternal Grandfather    Diabetes Maternal Aunt     Review of Systems  Constitutional:  Negative for fever.  Respiratory:  Positive for cough (dry, hacking cough), shortness of breath and wheezing (intermittent).   Cardiovascular:  Positive for chest pain (mid sternal pain - daily - no pattern). Negative for palpitations and leg swelling.  Gastrointestinal:        No gerd - controlled  Musculoskeletal:  Positive for arthralgias and back pain.  Neurological:  Positive for dizziness, light-headedness (occ when she gets up) and headaches (occ).  Psychiatric/Behavioral:  Positive for dysphoric mood and sleep disturbance (controlled). The patient is nervous/anxious.        More  irritated by sounds      Objective:   Vitals:   12/29/20 1107  BP: 114/78  Pulse: 62  Resp: 16  Temp: 97.9 F (36.6 C)   BP Readings from Last 3 Encounters:  12/29/20 114/78  11/24/20 105/64  10/27/20 123/68   Wt Readings from Last 3 Encounters:  12/29/20 135 lb (61.2 kg)  12/07/20 136 lb (61.7 kg)  11/24/20 139 lb 14.4 oz (63.5 kg)   Body mass index is 23.17 kg/m.   Physical Exam    Constitutional: Appears well-developed and well-nourished. No distress.  HENT:  Head: Normocephalic and  atraumatic.  Neck: Neck supple. No tracheal deviation present. No thyromegaly present.  No cervical lymphadenopathy Cardiovascular: Normal rate, regular rhythm and normal heart sounds.   No murmur heard..   no edema Pulmonary/Chest: Effort normal.  Diffusely decreased breath sounds-chronic.  No respiratory distress. No has no wheezes. No rales.  Skin: Skin is warm and dry. Not diaphoretic.  Senile purpura bilateral lower arms and lower legs.  Irregularly shaped, large mole right lower back-advised seeing dermatology Psychiatric: Normal mood and affect. Behavior is normal.      Assessment & Plan:    See Problem List for Assessment and Plan of chronic medical problems.    This visit occurred during the SARS-CoV-2 public health emergency.  Safety protocols were in place, including screening questions prior to the visit, additional usage of staff PPE, and extensive cleaning of exam room while observing appropriate contact time as indicated for disinfecting solutions.

## 2020-12-29 ENCOUNTER — Ambulatory Visit (INDEPENDENT_AMBULATORY_CARE_PROVIDER_SITE_OTHER): Payer: PPO | Admitting: Internal Medicine

## 2020-12-29 ENCOUNTER — Other Ambulatory Visit: Payer: Self-pay | Admitting: Student

## 2020-12-29 ENCOUNTER — Other Ambulatory Visit: Payer: Self-pay

## 2020-12-29 ENCOUNTER — Encounter: Payer: Self-pay | Admitting: Internal Medicine

## 2020-12-29 VITALS — BP 114/78 | HR 62 | Temp 97.9°F | Resp 16 | Ht 64.0 in | Wt 135.0 lb

## 2020-12-29 DIAGNOSIS — I10 Essential (primary) hypertension: Secondary | ICD-10-CM | POA: Diagnosis not present

## 2020-12-29 DIAGNOSIS — I700 Atherosclerosis of aorta: Secondary | ICD-10-CM | POA: Diagnosis not present

## 2020-12-29 DIAGNOSIS — F3289 Other specified depressive episodes: Secondary | ICD-10-CM | POA: Diagnosis not present

## 2020-12-29 DIAGNOSIS — G479 Sleep disorder, unspecified: Secondary | ICD-10-CM

## 2020-12-29 DIAGNOSIS — E039 Hypothyroidism, unspecified: Secondary | ICD-10-CM

## 2020-12-29 DIAGNOSIS — R413 Other amnesia: Secondary | ICD-10-CM

## 2020-12-29 DIAGNOSIS — E782 Mixed hyperlipidemia: Secondary | ICD-10-CM | POA: Diagnosis not present

## 2020-12-29 DIAGNOSIS — M810 Age-related osteoporosis without current pathological fracture: Secondary | ICD-10-CM | POA: Diagnosis not present

## 2020-12-29 DIAGNOSIS — R7303 Prediabetes: Secondary | ICD-10-CM

## 2020-12-29 MED ORDER — SERTRALINE HCL 100 MG PO TABS: 100.0000 mg | ORAL_TABLET | Freq: Every day | ORAL | 1 refills | Status: AC

## 2020-12-29 NOTE — Assessment & Plan Note (Signed)
Chronic Clinically euthyroid TSH 6 months ago within normal limits and has been stable We will hold off on rechecking TSH until her next visit Continue levothyroxine 125 mcg daily

## 2020-12-29 NOTE — Assessment & Plan Note (Signed)
Chronic Not ideally controlled She is experiencing decreased motivation and does feel depressed Increase sertraline to 100 mg daily

## 2020-12-29 NOTE — Assessment & Plan Note (Signed)
Chronic Following with neurology Continue Aricept 10 mg daily

## 2020-12-29 NOTE — Assessment & Plan Note (Signed)
Chronic DEXA up-to-date Prolia every 6 months Continue calcium and vitamin D

## 2020-12-29 NOTE — Assessment & Plan Note (Signed)
Chronic Sugars have been very stable in the prediabetic range.  I think she is low risk for developing diabetes We will recheck A1c in 6 months

## 2020-12-29 NOTE — Assessment & Plan Note (Signed)
Chronic Controlled Continue clonazepam 0.5 mg nightly

## 2020-12-29 NOTE — Assessment & Plan Note (Signed)
Chronic Blood pressure well controlled Currently she denies any lightheadedness when she first stands up-advised to have her daughter monitor for this and if this develops may need to decrease metoprolol Continue metoprolol 25 mg twice daily

## 2021-01-03 IMAGING — DX DG CHEST 2V
2 series · 2 of 2 positions shown · non-contrast
Comparison: 01/11/2020

CLINICAL DATA: Right pneumonia follow-up

EXAM:
CHEST - 2 VIEW

[chest pa]
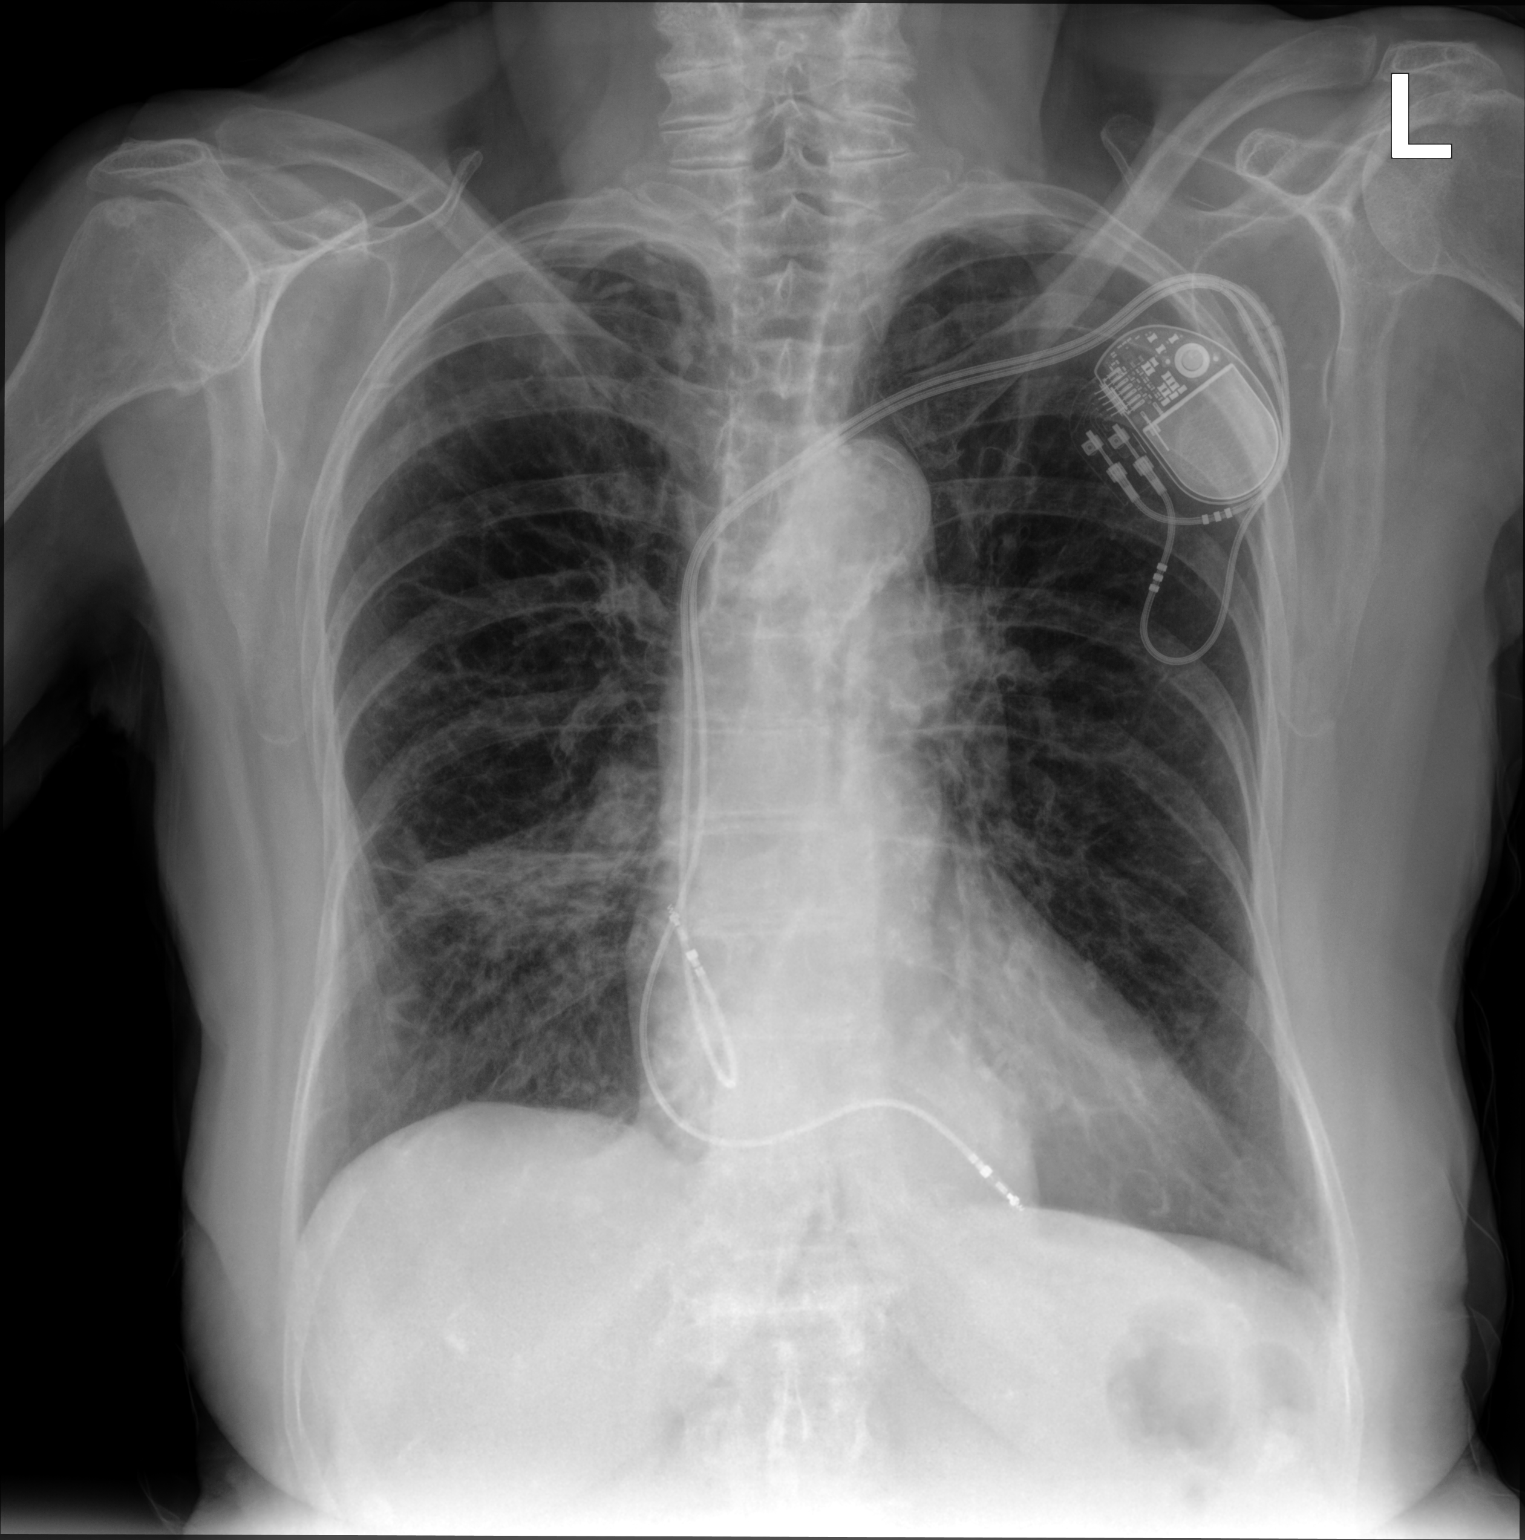

[chest lat]
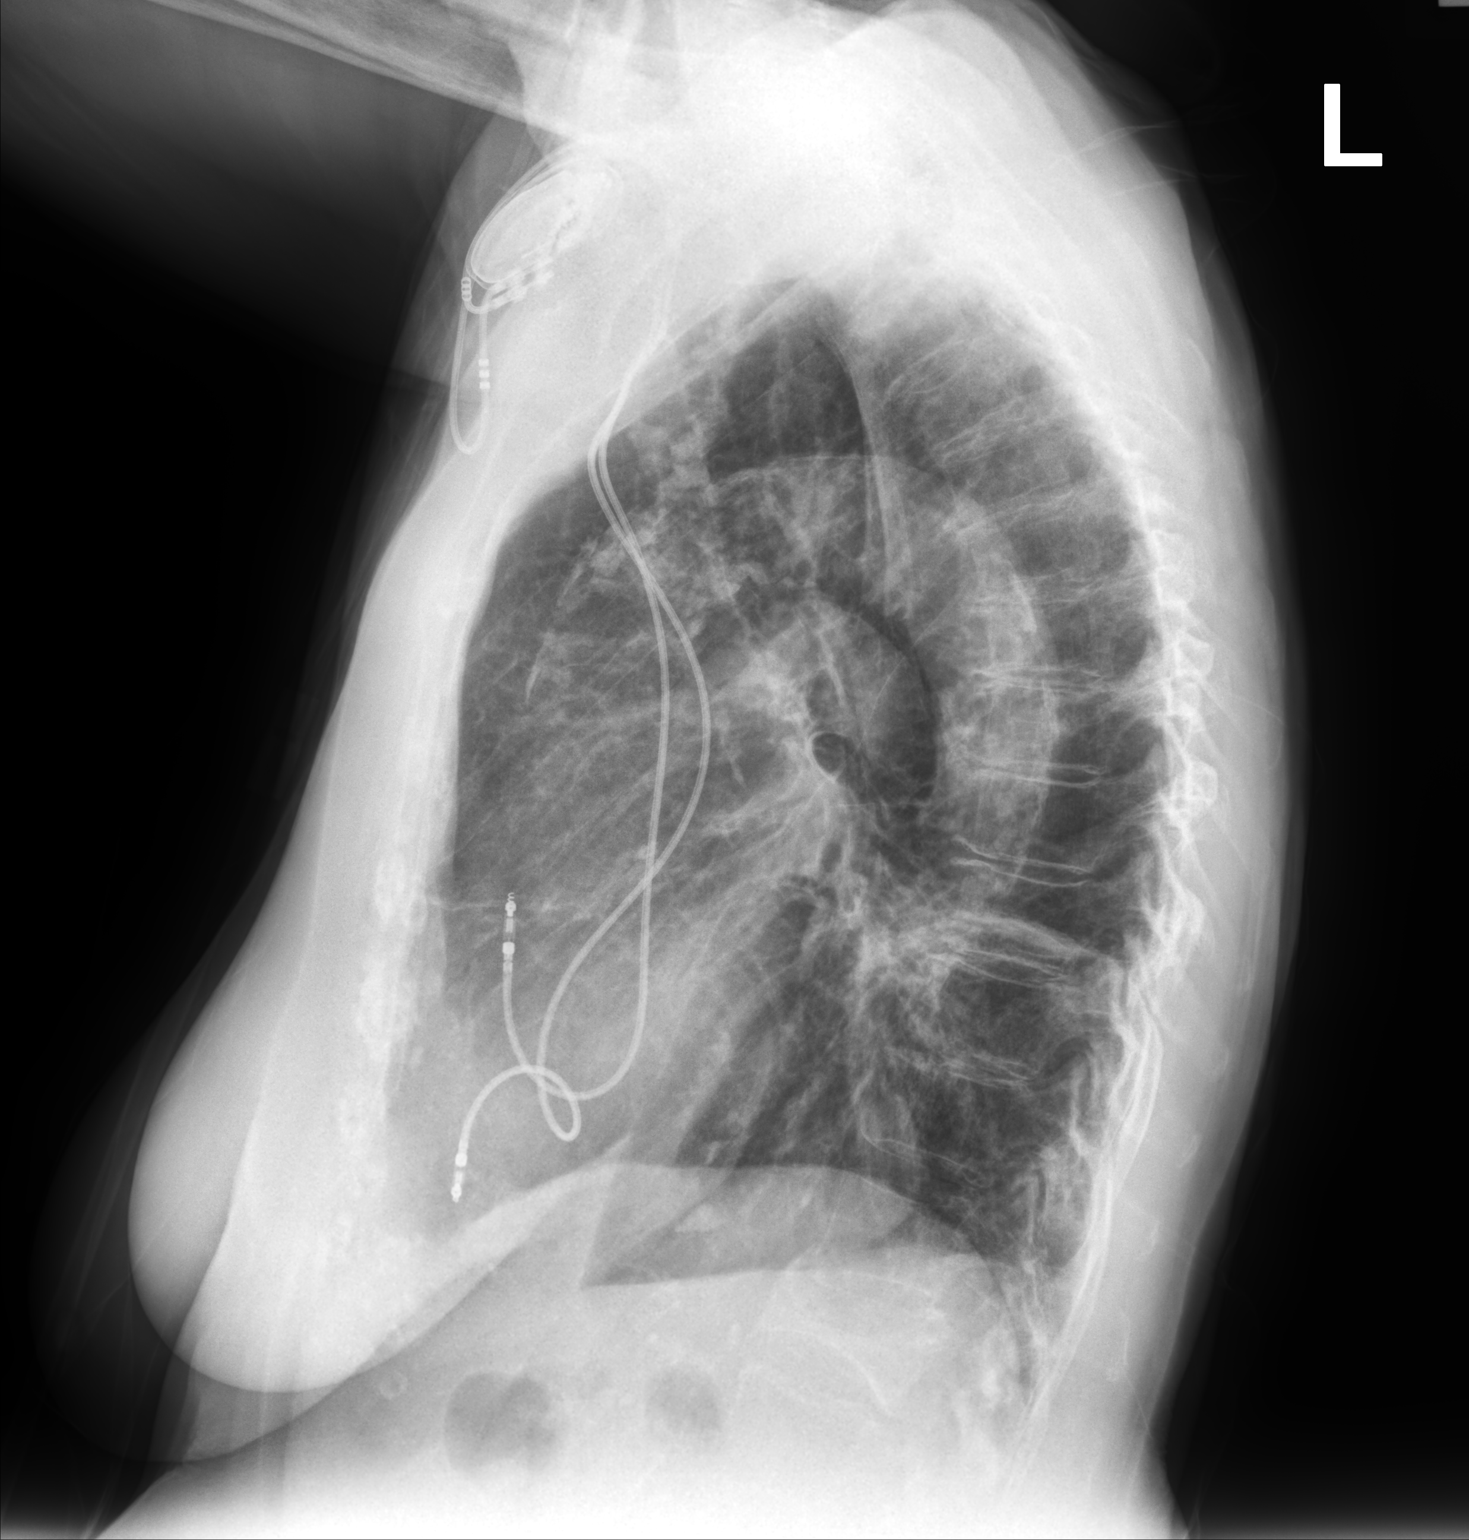

[2 of 2 positions shown; findings below may reference images not displayed]

FINDINGS: Improving aeration at the right lung base within the lower lobe with
some persistent patchy density. COPD. No pleural effusion. Stable
cardiomediastinal contours. Left chest wall dual lead pacemaker.
IMPRESSION: Improving right lower lobe aeration with some persistent patchy
density. Additional follow-up is recommended.

## 2021-01-07 ENCOUNTER — Telehealth: Payer: Self-pay | Admitting: Internal Medicine

## 2021-01-07 MED ORDER — MOLNUPIRAVIR 200 MG PO CAPS: 800.0000 mg | ORAL_CAPSULE | Freq: Two times a day (BID) | ORAL | 0 refills | Status: AC

## 2021-01-07 NOTE — Telephone Encounter (Signed)
Called and spoke with pt daughter [name] in regards to the pt having COVID. [name] would like to see if paxlovid could be sent in for the pt.

## 2021-01-07 NOTE — Telephone Encounter (Signed)
Called to advise pt that medication has been sent to her pharmacy for [place]. No answer, left a VM for [name], pt daughter.

## 2021-01-07 NOTE — Telephone Encounter (Signed)
Paxlovid interacts with several of her medications and we would have to hold or decrease some of her current medications so I have prescribed a different anti-viral - molnupiravir - this was sent to [facility] - if they do nt have it let us know immediately so I can send it someplace else

## 2021-01-07 NOTE — Telephone Encounter (Signed)
  Seeking advice Daughter calling to report  patient tested COVID+

## 2021-01-11 ENCOUNTER — Encounter: Payer: Self-pay | Admitting: Internal Medicine

## 2021-01-11 ENCOUNTER — Telehealth: Payer: Self-pay | Admitting: Student

## 2021-01-11 NOTE — Telephone Encounter (Signed)
Spoke with patient's daughter, Denita Lun, regarding the Palliative referral/services and all questions were answered and she was in agreement with beginning services with [place].  I have scheduled a Palliative Zoom Consult (due to patient having tested positive for COVID) for 01/12/21 @ 3:30 PM.

## 2021-01-12 ENCOUNTER — Other Ambulatory Visit: Payer: Self-pay

## 2021-01-12 ENCOUNTER — Other Ambulatory Visit: Payer: PPO | Admitting: Student

## 2021-01-12 ENCOUNTER — Telehealth: Payer: Self-pay | Admitting: Student

## 2021-01-12 DIAGNOSIS — C3491 Malignant neoplasm of unspecified part of right bronchus or lung: Secondary | ICD-10-CM | POA: Diagnosis not present

## 2021-01-12 DIAGNOSIS — I5031 Acute diastolic (congestive) heart failure: Secondary | ICD-10-CM | POA: Diagnosis not present

## 2021-01-12 DIAGNOSIS — J439 Emphysema, unspecified: Secondary | ICD-10-CM | POA: Diagnosis not present

## 2021-01-12 DIAGNOSIS — J9611 Chronic respiratory failure with hypoxia: Secondary | ICD-10-CM | POA: Diagnosis not present

## 2021-01-12 DIAGNOSIS — J441 Chronic obstructive pulmonary disease with (acute) exacerbation: Secondary | ICD-10-CM | POA: Diagnosis not present

## 2021-01-12 NOTE — Telephone Encounter (Signed)
Spoke with patient's daughter [name], to let her know that the Palliative NP was not able to see patient on 01/12/21 and requested to reschedule the Palliative Consult, this was rescheduled for 01/19/21 @ 3:30 PM

## 2021-01-19 ENCOUNTER — Other Ambulatory Visit: Payer: Self-pay

## 2021-01-19 ENCOUNTER — Telehealth: Payer: Self-pay | Admitting: Student

## 2021-01-19 ENCOUNTER — Other Ambulatory Visit: Payer: PPO | Admitting: Student

## 2021-01-19 NOTE — Progress Notes (Deleted)
Encounter opened in error. Patient cancelled visit.

## 2021-01-19 NOTE — Telephone Encounter (Signed)
Palliative NP spoke with daughter [name] regarding telehealth consult scheduled at 330. Patient does not feel up to consult today as she has had recent covid-19 infection. Daughter asks to reschedule appointment. NP asked if patient needed to be evaluated due to not feeling well; she declines at this time. Will have scheduler reach out to patient to reschedule consult in upcoming weeks.

## 2021-01-21 ENCOUNTER — Telehealth: Payer: Self-pay | Admitting: Pharmacist

## 2021-01-21 NOTE — Progress Notes (Signed)
Chronic Care Management Pharmacy Assistant   Name: DJENEBA BARSCH  MRN: [id number] DOB: 11/05/1933   Reason for Encounter: Disease State   Conditions to be addressed/monitored: General    Recent office visits:  12/29/20 Dr. Quay Burow Internal Medicine (HTN) increased sertraline to 100 mg daily  Recommend seeing dermatologist and 6 month F/U  Recent consult visits:  11/24/20 Dr. Julien Nordmann Oncology ( Malignant neoplasm of lungs) 12/07/20 Dr. Georga Kaufmann Orthopedics (right knee and left foot pain)   Hospital visits:  None in previous 6 months  Medications: Outpatient Encounter Medications as of 01/21/2021  Medication Sig   acetaminophen (TYLENOL) 325 MG tablet Take 1-2 tablets (325-650 mg total) by mouth every 6 (six) hours as needed for mild pain, moderate pain or fever. Total acetaminophen dose from all sources not to exceed 4 g/day.   apixaban (ELIQUIS) 5 MG TABS tablet Take 1 tablet (5 mg total) by mouth 2 (two) times daily.   Calcium-Magnesium-Vitamin D (CALCIUM 1200+D3 PO) Take 1 tablet by mouth at bedtime.    clonazePAM (KLONOPIN) 0.5 MG tablet Take 1 tablet (0.5 mg total) by mouth at bedtime.   cyanocobalamin 1000 MCG tablet Take 1 tablet (1,000 mcg total) by mouth daily.   docusate sodium (COLACE) 100 MG capsule Take 100 mg by mouth at bedtime.    donepezil (ARICEPT) 10 MG tablet TAKE 1 TABLET BY MOUTH EVERYDAY AT BEDTIME (Patient taking differently: Take 10 mg by mouth at bedtime.)   ferrous sulfate 325 (65 FE) MG tablet Take 1 tablet (325 mg total) by mouth daily with breakfast.   furosemide (LASIX) 40 MG tablet TAKE 1 TABLET BY MOUTH EVERY OTHER DAY   gabapentin (NEURONTIN) 300 MG capsule TAKE ONE CAPSULE TWICE DAILY AND 2 AT NIGHT = FOUR TOTAL DAILY.   Glycopyrrolate-Formoterol (BEVESPI AEROSPHERE) 9-4.8 MCG/ACT AERO Inhale 2 puffs into the lungs in the morning and at bedtime.   ipratropium (ATROVENT) 0.02 % nebulizer solution Take 2.5 mLs (0.5 mg total) by nebulization every 6  (six) hours as needed for wheezing or shortness of breath.   levothyroxine (SYNTHROID) 125 MCG tablet Take 1 tablet (125 mcg total) by mouth daily before breakfast.   metoprolol tartrate (LOPRESSOR) 25 MG tablet Take 1 tablet (25 mg total) by mouth daily.   Multiple Vitamins-Minerals (PRESERVISION AREDS 2 PO) Take 1 capsule by mouth 2 (two) times daily.   pantoprazole (PROTONIX) 40 MG tablet Take 1 tablet (40 mg total) by mouth 2 (two) times daily before a meal.   potassium chloride (KLOR-CON) 10 MEQ tablet TAKE 1 TABLET BY MOUTH EVERY OTHER DAY WITH FUROSEMIDE   pravastatin (PRAVACHOL) 40 MG tablet TAKE 1 TABLET BY MOUTH DAILY   predniSONE (DELTASONE) 10 MG tablet TAKE 1 TABLET BY MOUTH DAILY WITH BREAKFAST   PROLIA 60 MG/ML SOSY injection Inject 60 mg into the skin every 6 (six) months.    sertraline (ZOLOFT) 100 MG tablet Take 1 tablet (100 mg total) by mouth daily.   traMADol (ULTRAM) 50 MG tablet Take 1 tablet (50 mg total) by mouth every 8 (eight) hours as needed for severe pain. For chronic back pain   No facility-administered encounter medications on file as of 01/21/2021.    Pharmacist Review  Have you had any problems recently with your health? Spoke with patient daughter [name], who stated that today the patient is doing well. Sometimes it can be up and down from one day to the next with her health. She did mention that the patient is  just getting over Covid was a littler congested but is feeling much better now.  Have you had any problems with your pharmacy? Daughter states that patient is not having any issues with getting medications from the pharmacy or the cost of medications. She states that the patient does get a grant to help pay for eliquis, prolia and Bevespi.  What issues or side effects are you having with your medications? Daughter states that patient has not had any side effects to any medications that she know of  What would you like me to pass along to [facility], CPP for them to help you with?  Daughter states that right now she does not have any concerns about her mother's health or medications  What can we do to take care of you better?  Daughter states that appreciates the call to check on mother, if any changes she will contact Dr. Quay Burow office  Star Rating Drugs: Pravastatin 12/14/20 90 ds  [place] Pharmacist Assistant [phone]   Time spent:29

## 2021-01-31 ENCOUNTER — Ambulatory Visit: Payer: PPO | Admitting: Internal Medicine

## 2021-02-06 IMAGING — DX DG CHEST 2V
2 series · 2 of 2 positions shown · non-contrast
Comparison: February 15, 2020 and February 13, 2020;January 11, 2020

CLINICAL DATA: Recent aspiration pneumonitis

EXAM:
CHEST - 2 VIEW

[chest pa]
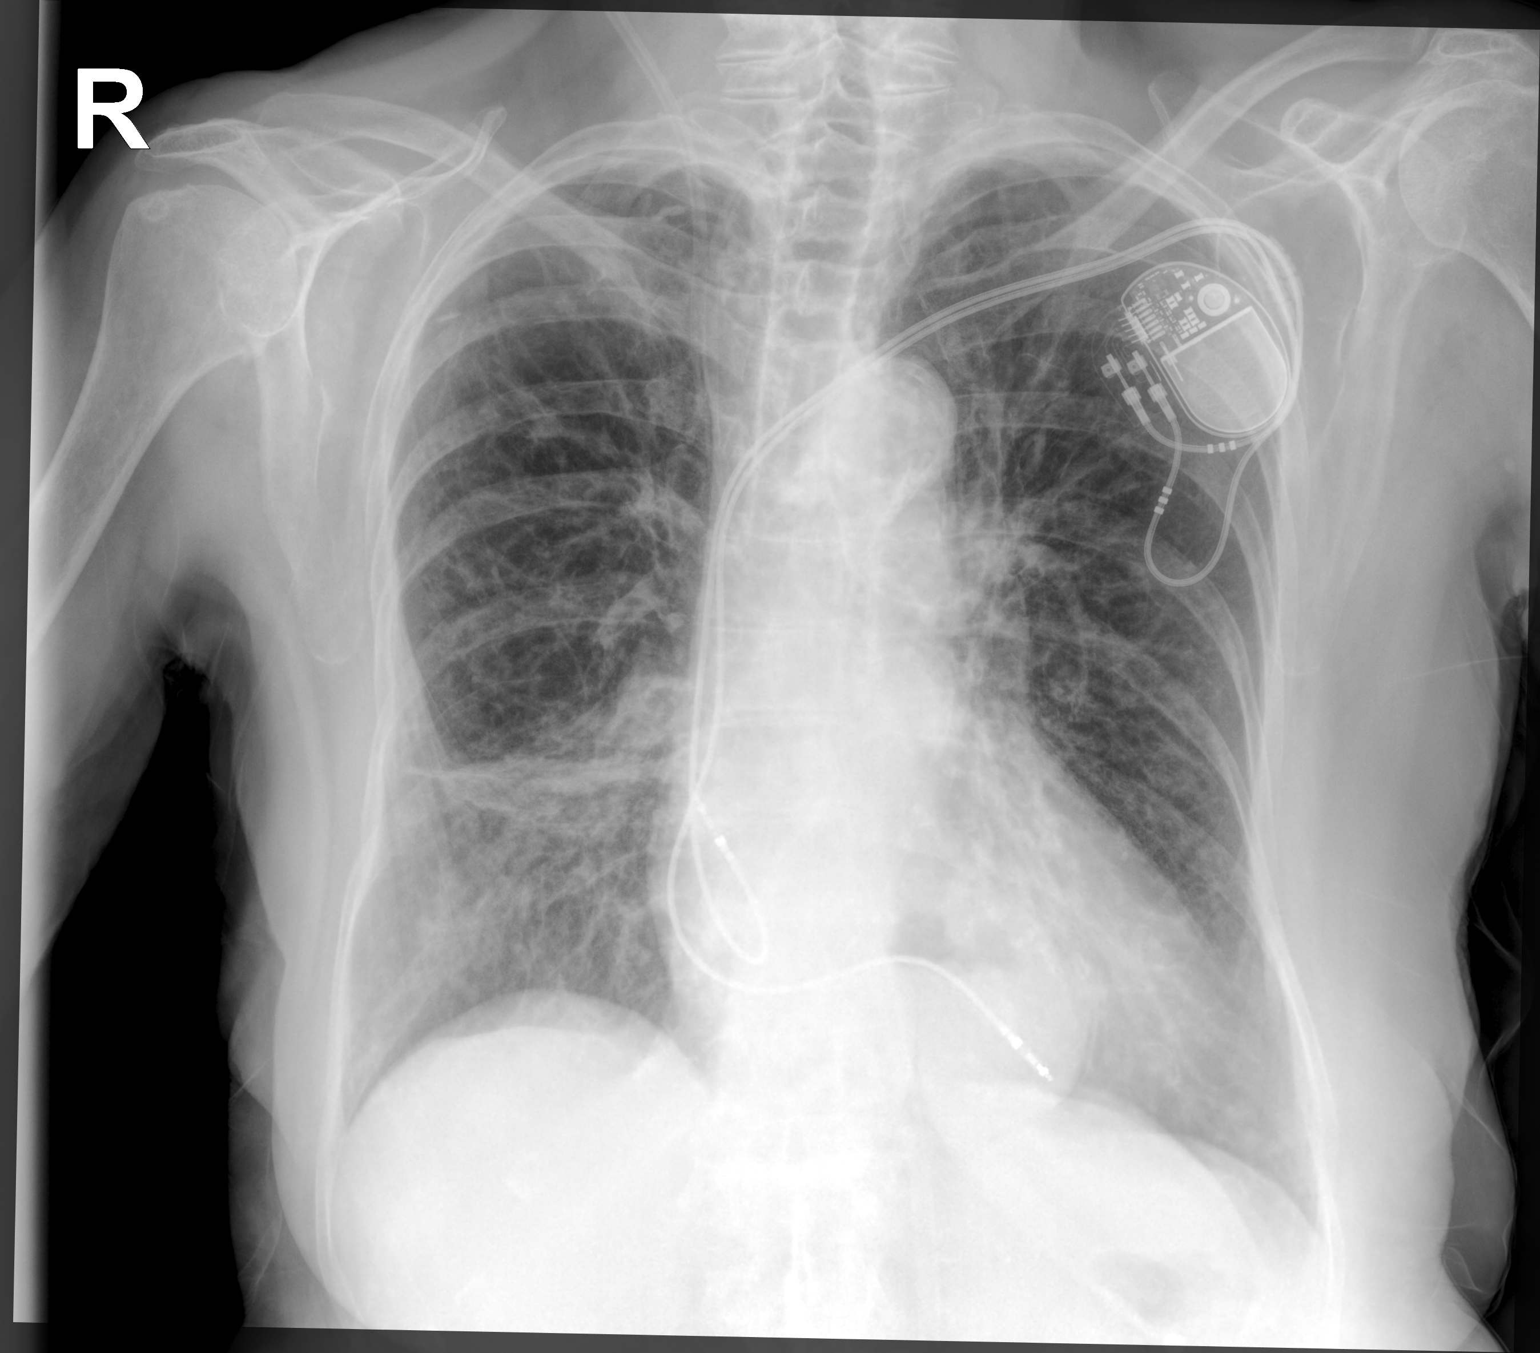

[chest lat]
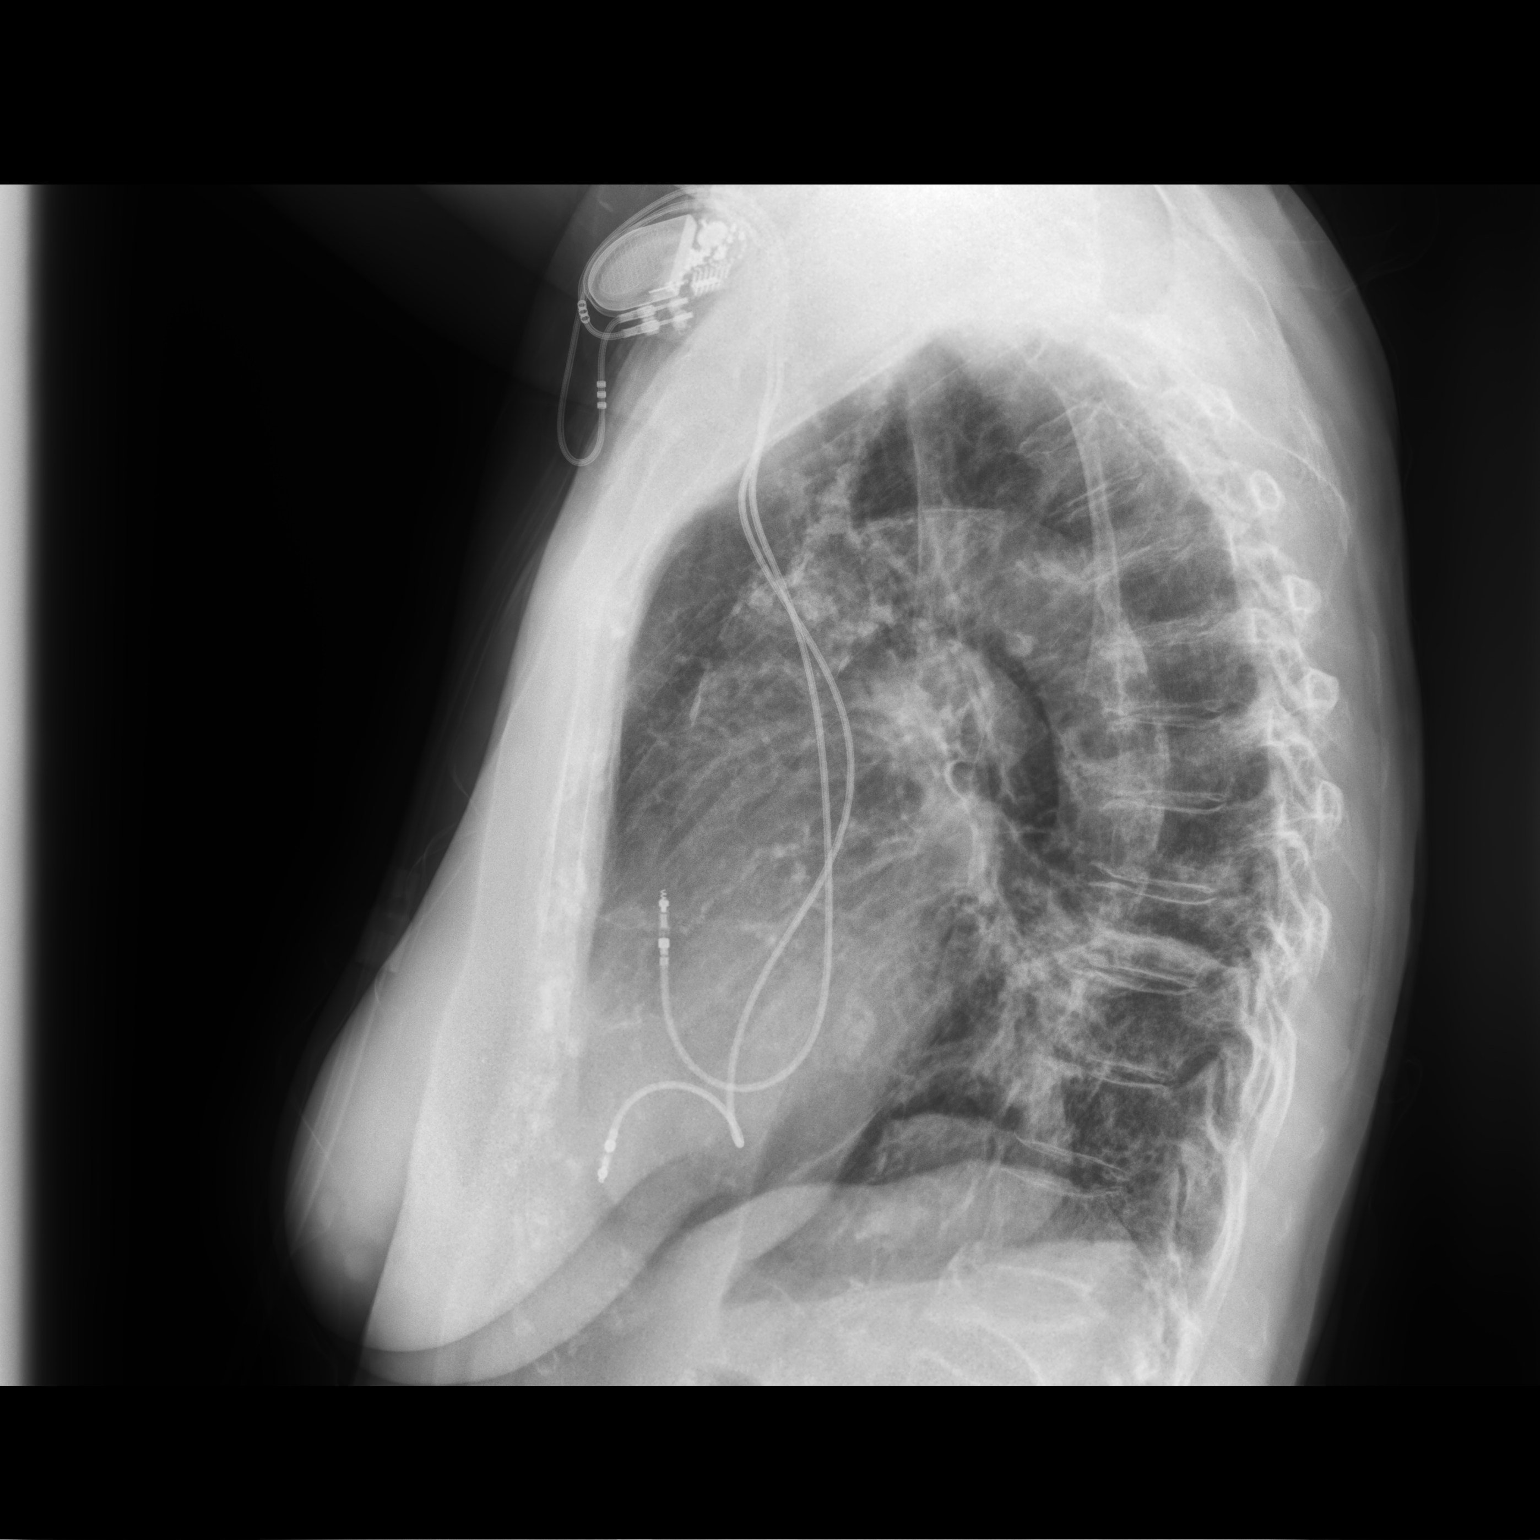

[2 of 2 positions shown; findings below may reference images not displayed]

FINDINGS: There is residual atelectatic change in the right lower lobe with
probable scarring. There is no frank edema or airspace opacity.
Lungs elsewhere are clear. Heart is upper normal in size with
pacemaker leads attached to right atrium and right ventricle. There
is aortic atherosclerosis. Pulmonary vascularity within normal
limits. No adenopathy evident. Hiatal hernia present. There is
stable marked collapse of the L1 vertebral body.
IMPRESSION: Scarring and atelectasis right lower lobe. Slight further clearing
of opacity in this area compared to most recent chest radiographic
examination. No new opacity evident.

Stable cardiac silhouette. Stable pacemaker lead positioning. Hiatal
hernia present.

Aortic Atherosclerosis ([id number]).

## 2021-02-07 DIAGNOSIS — J441 Chronic obstructive pulmonary disease with (acute) exacerbation: Secondary | ICD-10-CM | POA: Diagnosis not present

## 2021-02-09 ENCOUNTER — Telehealth: Payer: Self-pay

## 2021-02-09 NOTE — Telephone Encounter (Signed)
Spoke with patient's daughter [name] regarding rescheduling Palliative care consult. She states she was about to go to the patient's home and will discuss with her and call back.

## 2021-02-13 NOTE — Progress Notes (Signed)
Subjective:    Patient ID: Brandi Raymond, female    DOB: 1933/10/03, 87 y.o.   MRN: [id number]  HPI The patient is here for an acute visit.   In the past couple of weeks she has developed seveal bruises on her arms, legs, breast.    No changes in medications/supplements in the past month.    She does not take her iron.    It makes her stool black and tarry and she does not like that.    Medications and allergies reviewed with patient and updated if appropriate.  Patient Active Problem List   Diagnosis Date Noted   Aortic atherosclerosis ([place]) 12/28/2020   Wedge compression fracture of first lumbar vertebra, initial encounter for closed fracture ([place]) 08/19/2020   T12 compression fracture, with routine healing, subsequent encounter 08/10/2020   Chronic back pain 08/06/2020   Displaced fracture of left femoral neck ([place]) 07/17/2020   Closed left hip fracture ([place]) 07/15/2020   Goals of care, counseling/discussion 06/16/2020   Large hiatal hernia 04/07/2020   Dysphagia 04/07/2020   Pacemaker 03/16/2020   Chronic anticoagulation 03/16/2020   Encounter for medication review and counseling 03/15/2020   Gastritis with hemorrhage 03/04/2020   PNA (pneumonia) 02/12/2020   Right lower lobe pneumonia 01/13/2020   CAP (community acquired pneumonia) 02/63/7858   Acute diastolic CHF (congestive heart failure) ([place]) 09/02/2019   Paroxysmal atrial fibrillation (HCC)    Second degree Mobitz II AV block 08/21/2019   Second degree AV block 08/08/2019   SOB (shortness of breath) 08/05/2019   Chest tightness 08/05/2019   GERD (gastroesophageal reflux disease) 05/21/2019   Chronic respiratory failure with hypoxia ([place]) 05/21/2019   Medication management 05/21/2019   Stage I squamous cell carcinoma of right lung ([place]) 02/06/2019   Constipation 09/11/2018   COPD exacerbation ([place]) 09/04/2018   Right lower lobe lung mass 09/04/2018   Closed compression fracture of L1 lumbar vertebra,  initial encounter ([place]) 09/04/2018   Preoperative clearance 07/19/2018   Lower back pain 06/25/2018   Cubital tunnel syndrome on left 05/22/2018   Dupuytren's contracture of both hands 05/10/2018   Primary osteoarthritis of both knees 05/10/2018   Difficulty urinating 05/10/2018   Osteoporosis 06/14/2017   Prediabetes 06/23/2015   Depression 06/23/2015   Carotid stenosis 10/12/2014   Sleep disorder 04/09/2014   Dizziness 01/20/2013   Mesenteric artery stenosis ([place]) 01/13/2013   Thoracic aneurysm without mention of rupture 11/04/2012   Chronic mesenteric ischemia ([place]) 10/08/2012   Memory deficit 06/20/2012   Irritable bowel syndrome 06/20/2012   Ocular myasthenia gravis ([place]) 06/20/2012   PVD (peripheral vascular disease) ([place]) 06/20/2012   Hereditary and idiopathic peripheral neuropathy 05/22/2012   Syncope 08/28/2011   Anemia 07/22/2010   ABDOMINAL BRUIT 08/17/2009   Hypothyroidism 05/26/2008   VITAMIN D DEFICIENCY 05/26/2008   CHOLELITHIASIS 12/06/2007   DIVERTICULOSIS, COLON 12/05/2007   HYPERLIPIDEMIA 08/19/2007   COPD (chronic obstructive pulmonary disease) with emphysema ([place]) 08/19/2007   CIGARETTE SMOKER 02/06/2007   Essential hypertension 02/06/2007   COLONIC POLYPS 11/21/2004    Current Outpatient Medications on File Prior to Visit  Medication Sig Dispense Refill   acetaminophen (TYLENOL) 325 MG tablet Take 1-2 tablets (325-650 mg total) by mouth every 6 (six) hours as needed for mild pain, moderate pain or fever. Total acetaminophen dose from all sources not to exceed 4 g/day.     apixaban (ELIQUIS) 5 MG TABS tablet Take 1 tablet (5 mg total) by mouth 2 (two) times daily. [place]  tablet 3   Calcium-Magnesium-Vitamin D (CALCIUM 1200+D3 PO) Take 1 tablet by mouth at bedtime.      clonazePAM (KLONOPIN) 0.5 MG tablet Take 1 tablet (0.5 mg total) by mouth at bedtime. 30 tablet 3   cyanocobalamin 1000 MCG tablet Take 1 tablet (1,000 mcg total) by mouth daily. 90 tablet 3    docusate sodium (COLACE) 100 MG capsule Take 100 mg by mouth at bedtime.      donepezil (ARICEPT) 10 MG tablet TAKE 1 TABLET BY MOUTH EVERYDAY AT BEDTIME (Patient taking differently: Take 10 mg by mouth at bedtime.) 90 tablet 2   ferrous sulfate 325 (65 FE) MG tablet Take 1 tablet (325 mg total) by mouth daily with breakfast.     furosemide (LASIX) 40 MG tablet TAKE 1 TABLET BY MOUTH EVERY OTHER DAY 15 tablet 0   gabapentin (NEURONTIN) 300 MG capsule TAKE ONE CAPSULE TWICE DAILY AND 2 AT NIGHT = FOUR TOTAL DAILY. 360 capsule 1   Glycopyrrolate-Formoterol (BEVESPI AEROSPHERE) 9-4.8 MCG/ACT AERO Inhale 2 puffs into the lungs in the morning and at bedtime. 32.1 g 1   ipratropium (ATROVENT) 0.02 % nebulizer solution Take 2.5 mLs (0.5 mg total) by nebulization every 6 (six) hours as needed for wheezing or shortness of breath. 75 mL 11   levothyroxine (SYNTHROID) 125 MCG tablet Take 1 tablet (125 mcg total) by mouth daily before breakfast. 90 tablet 1   metoprolol tartrate (LOPRESSOR) 25 MG tablet Take 1 tablet (25 mg total) by mouth daily. 30 tablet 0   Multiple Vitamins-Minerals (PRESERVISION AREDS 2 PO) Take 1 capsule by mouth 2 (two) times daily.     pantoprazole (PROTONIX) 40 MG tablet Take 1 tablet (40 mg total) by mouth 2 (two) times daily before a meal. 180 tablet 1   potassium chloride (KLOR-CON) 10 MEQ tablet TAKE 1 TABLET BY MOUTH EVERY OTHER DAY WITH FUROSEMIDE 45 tablet 0   pravastatin (PRAVACHOL) 40 MG tablet TAKE 1 TABLET BY MOUTH DAILY 90 tablet 0   predniSONE (DELTASONE) 10 MG tablet TAKE 1 TABLET BY MOUTH DAILY WITH BREAKFAST 30 tablet 10   PROLIA 60 MG/ML SOSY injection Inject 60 mg into the skin every 6 (six) months.      sertraline (ZOLOFT) 100 MG tablet Take 1 tablet (100 mg total) by mouth daily. 90 tablet 1   traMADol (ULTRAM) 50 MG tablet Take 1 tablet (50 mg total) by mouth every 8 (eight) hours as needed for severe pain. For chronic back pain 90 tablet 0   No current  facility-administered medications on file prior to visit.    Past Medical History:  Diagnosis Date   Adenomatous colon polyp    Arthritis    CAD (coronary artery disease)    PTCA of RCA    Carotid artery occlusion    Cervical spine fracture (HCC)    CHF (congestive heart failure) (HCC)    COPD (chronic obstructive pulmonary disease) ([place])    Depression    Diverticulosis    Gastritis 09/15/1991   GERD (gastroesophageal reflux disease) 09/15/1991   Dr Sharlett Iles   Hiatal hernia 09/15/1991   Hip fracture, left, closed, initial encounter ([place]) 07/16/2020   Hip fracture, right ([place])    HLD (hyperlipidemia)    HTN (hypertension)    Hyperplastic polyps of stomach 11/2007   colonoscopy   Hypertension    Hypothyroidism    affecting the left eye, proptosis   Iron deficiency anemia    Lung cancer ([place])    s/p  XRT   Macular degeneration of left eye    Memory loss    Mesenteric artery stenosis (HCC)    Myocardial infarction [facility]) 1993   Ocular myasthenia gravis ([place])    Dr Jannifer [place]   Orthostatic hypotension 06/05/2013   PONV (postoperative nausea and vomiting)    Presence of permanent cardiac pacemaker 08/2019   Strabismus    left eye   Syncope 1998    Past Surgical History:  Procedure Laterality Date   ABDOMINAL AORTAGRAM N/A 10/15/2012   Procedure: ABDOMINAL Maxcine Ham;  Surgeon: Serafina Mitchell, MD;  Location[facility] CATH LAB;  Service: Cardiovascular;  Laterality: N/A;   arm surgery Left    fx   BALLOON ANGIOPLASTY, [place]   LAD 20/50, CFX OK, RCA 30 at prev PTCA site, EF with mild HK inferior wall   CAROTID ANGIOGRAM N/A 10/28/2014   Procedure: CAROTID ANGIOGRAM;  Surgeon: Serafina Mitchell, MD;  Location: [facility] CATH LAB;  Service: Cardiovascular;  Laterality: N/A;   CAROTID ENDARTERECTOMY     CATARACT EXTRACTION     bilateral   COLONOSCOPY W/ POLYPECTOMY  2006   Adenomatous polyps   ENDARTERECTOMY Left 01/14/2015   Procedure: LEFT CAROTID  ENDARTERECTOMY ;  Surgeon: Serafina Mitchell, MD;  Location: [place];  Service: Vascular;  Laterality: Left;   ENDARTERECTOMY Right 08/12/2015   Procedure: ENDARTERECTOMY CAROTID WITH PATCH ANGIOPLASTY;  Surgeon: Serafina Mitchell, MD;  Location: [place];  Service: Vascular;  Laterality: Right;   EYE MUSCLE SURGERY Left 11/04/2015   EYE SURGERY Bilateral May 2016   Eyelids   FOOT SURGERY Left    LEG SURGERY Left    laceration   MIDDLE EAR SURGERY Left 1970   PACEMAKER IMPLANT N/A 08/21/2019   Procedure: PACEMAKER IMPLANT;  Surgeon: [name], MD;  Location: [place] CV LAB;  Service: Cardiovascular;  Laterality: N/A;   PERCUTANEOUS STENT INTERVENTION  12/03/2012   Procedure: PERCUTANEOUS STENT INTERVENTION;  Surgeon: Serafina Mitchell, MD;  Location: [facility] CATH LAB;  Service: Cardiovascular;;  sma stent x1   PERIPHERAL VASCULAR BALLOON ANGIOPLASTY  07/22/2019   Procedure: PERIPHERAL VASCULAR BALLOON ANGIOPLASTY;  Surgeon: Serafina Mitchell, MD;  Location: [place] CV LAB;  Service: Cardiovascular;;  Superior mesenteric   STRABISMUS SURGERY Left 10/28/2015   Procedure: REPAIR STRABISMUS LEFT EYE;  Surgeon: Lamonte Sakai, MD;  Location: [place];  Service: Ophthalmology;  Laterality: Left;   Third-degree Davarious [place]  2003   [facility]-legs ,buttocks,arms   TOTAL ABDOMINAL HYSTERECTOMY  1973   Dysfunctional menses   TOTAL HIP ARTHROPLASTY Left 07/17/2020   Procedure: HEMI HIP ARTHROPLASTY ANTERIOR APPROACH;  Surgeon: Leandrew Koyanagi, MD;  Location: [place];  Service: Orthopedics;  Laterality: Left;   UPPER GI ENDOSCOPY      Dr Joaquim Lai ANGIOGRAM N/A 10/15/2012   Procedure: VISCERAL ANGIOGRAM;  Surgeon: Serafina Mitchell, MD;  Location: [facility] CATH LAB;  Service: Cardiovascular;  Laterality: N/A;   VISCERAL ANGIOGRAM N/A 12/03/2012   Procedure: VISCERAL ANGIOGRAM;  Surgeon: Serafina Mitchell, MD;  Location[facility] CATH LAB;  Service: Cardiovascular;  Laterality: N/A;   VISCERAL ANGIOGRAM N/A 08/05/2013   Procedure:  MESENTERIC ANGIOGRAM;  Surgeon: Serafina Mitchell, MD;  Location: [facility] CATH LAB;  Service: Cardiovascular;  Laterality: N/A;   VISCERAL ANGIOGRAM N/A 10/28/2014   Procedure: VISCERAL ANGIOGRAM;  Surgeon: Serafina Mitchell, MD;  Location: [facility] CATH LAB;  Service: Cardiovascular;  Laterality: N/A;  VISCERAL ANGIOGRAPHY N/A 07/22/2019   Procedure: MESENTERIC ANGIOGRAPHY;  Surgeon: Serafina Mitchell, MD;  Location: [place] CV LAB;  Service: Cardiovascular;  Laterality: N/A;    Social History   Socioeconomic History   Marital status: Married    Spouse name: Not on file   Number of children: 3   Years of education: 9th   Highest education level: Not on file  Occupational History   Occupation: Retired  Tobacco Use   Smoking status: Former    Packs/day: 1.00    Years: 60.00    Pack years: 60.00    Types: Cigarettes    Quit date: 07/2019    Years since quitting: 1.5   Smokeless tobacco: Never   Tobacco comments:    Successfully quit  Vaping Use   Vaping Use: Never used  Substance and Sexual Activity   Alcohol use: No    Alcohol/week: 0.0 standard drinks   Drug use: No   Sexual activity: Not Currently  Other Topics Concern   Not on file  Social History Narrative   Patient is right handed.  Lives in [place] with husband.   Patient drinks 3-4 cups of caffeine daily.   Social Determinants of Health   Financial Resource Strain: Low Risk    Difficulty of Paying Living Expenses: Not hard at all  Food Insecurity: No Food Insecurity   Worried About Charity fundraiser in the Last Year: Never true   [place] in the Last Year: Never true  Transportation Needs: No Transportation Needs   Lack of Transportation (Medical): No   Lack of Transportation (Non-Medical): No  Physical Activity: Inactive   Days of Exercise per Week: 0 days   Minutes of Exercise per Session: 0 min  Stress: No Stress Concern Present   Feeling of Stress : Not at all  Social Connections: Not on file     Family History  Problem Relation Age of Onset   Throat cancer Mother        ? thyroid cancer   Cancer Mother    Emphysema Father    Diabetes Father    Heart attack Father 68   Colon cancer Brother    Cerebral aneurysm Brother    Hypothyroidism Sister        X2   Cancer Brother        Ear   Diabetes Paternal Grandmother    Diabetes Paternal Grandfather    Diabetes Maternal Aunt     Review of Systems  Constitutional:  Negative for fever.  Respiratory:  Positive for cough, shortness of breath (chronic) and wheezing (intermittent).   Cardiovascular:  Negative for chest pain, palpitations and leg swelling.  Gastrointestinal:  Positive for constipation. Negative for blood in stool.  Genitourinary:  Negative for hematuria.  Neurological:  Positive for light-headedness (occ). Negative for headaches.  Hematological:  Bruises/bleeds easily.      Objective:   Vitals:   02/14/21 0849  BP: 126/60  Pulse: 82  Temp: 97.9 F (36.6 C)  SpO2: 99%   BP Readings from Last 3 Encounters:  02/14/21 126/60  12/29/20 114/78  11/24/20 105/64   Wt Readings from Last 3 Encounters:  02/14/21 133 lb (60.3 kg)  12/29/20 135 lb (61.2 kg)  12/07/20 136 lb (61.7 kg)   Body mass index is 22.83 kg/m.   Physical Exam    Constitutional: Appears well-developed and well-nourished. No distress.  Head: Normocephalic and atraumatic.  Neck: Neck supple. No tracheal deviation present.  No thyromegaly present.  No cervical lymphadenopathy Cardiovascular: Normal rate, regular rhythm and normal heart sounds.  No murmur heard.  No edema Pulmonary/Chest: Effort normal and breath sounds normal. No respiratory distress. No has no wheezes. No rales.  Skin: Skin is warm and dry. Not diaphoretic. Several bruises on b/l upper and lower arms, b/l lower legs, breast Psychiatric: Normal mood and affect. Behavior is normal.       Assessment & Plan:    See Problem List for Assessment and Plan of chronic  medical problems.    This visit occurred during the SARS-CoV-2 public health emergency.  Safety protocols were in place, including screening questions prior to the visit, additional usage of staff PPE, and extensive cleaning of exam room while observing appropriate contact time as indicated for disinfecting solutions.

## 2021-02-14 ENCOUNTER — Ambulatory Visit (INDEPENDENT_AMBULATORY_CARE_PROVIDER_SITE_OTHER): Payer: PPO | Admitting: Internal Medicine

## 2021-02-14 ENCOUNTER — Other Ambulatory Visit: Payer: Self-pay

## 2021-02-14 ENCOUNTER — Encounter: Payer: Self-pay | Admitting: Internal Medicine

## 2021-02-14 VITALS — BP 126/60 | HR 82 | Temp 97.9°F | Ht 64.0 in | Wt 133.0 lb

## 2021-02-14 DIAGNOSIS — R238 Other skin changes: Secondary | ICD-10-CM

## 2021-02-14 DIAGNOSIS — G479 Sleep disorder, unspecified: Secondary | ICD-10-CM

## 2021-02-14 DIAGNOSIS — E039 Hypothyroidism, unspecified: Secondary | ICD-10-CM | POA: Diagnosis not present

## 2021-02-14 DIAGNOSIS — R233 Spontaneous ecchymoses: Secondary | ICD-10-CM | POA: Insufficient documentation

## 2021-02-14 LAB — COMPREHENSIVE METABOLIC PANEL
ALT: 15 U/L (ref 0–35)
AST: 26 U/L (ref 0–37)
Albumin: 3.9 g/dL (ref 3.5–5.2)
Alkaline Phosphatase: 40 U/L (ref 39–117)
BUN: 24 mg/dL — ABNORMAL HIGH (ref 6–23)
CO2: 34 mEq/L — ABNORMAL HIGH (ref 19–32)
Calcium: 9.1 mg/dL (ref 8.4–10.5)
Chloride: 100 mEq/L (ref 96–112)
Creatinine, Ser: 1.2 mg/dL (ref 0.40–1.20)
GFR: 41.26 mL/min — ABNORMAL LOW (ref >60.00)
Glucose, Bld: 100 mg/dL — ABNORMAL HIGH (ref 70–99)
Potassium: 4.1 mEq/L (ref 3.5–5.1)
Sodium: 144 mEq/L (ref 135–145)
Total Bilirubin: 0.4 mg/dL (ref 0.2–1.2)
Total Protein: 6.5 g/dL (ref 6.0–8.3)

## 2021-02-14 LAB — CBC WITH DIFFERENTIAL/PLATELET
Basophils Absolute: 0.1 10*3/uL (ref 0.0–0.1)
Basophils Relative: 0.7 % (ref 0.0–3.0)
Eosinophils Absolute: 0.1 10*3/uL (ref 0.0–0.7)
Eosinophils Relative: 0.7 % (ref 0.0–5.0)
HCT: 34.9 % — ABNORMAL LOW (ref 36.0–46.0)
Hemoglobin: 11 g/dL — ABNORMAL LOW (ref 12.0–15.0)
Lymphocytes Relative: 12.9 % (ref 12.0–46.0)
Lymphs Abs: 1.1 10*3/uL (ref 0.7–4.0)
MCHC: 31.5 g/dL (ref 30.0–36.0)
MCV: 86.5 fl (ref 78.0–100.0)
Monocytes Absolute: 0.8 10*3/uL (ref 0.1–1.0)
Monocytes Relative: 10.3 % (ref 3.0–12.0)
Neutro Abs: 6.1 10*3/uL (ref 1.4–7.7)
Neutrophils Relative %: 75.4 % (ref 43.0–77.0)
Platelets: 136 10*3/uL — ABNORMAL LOW (ref 150.0–400.0)
RBC: 4.03 Mil/uL (ref 3.87–5.11)
RDW: 16.1 % — ABNORMAL HIGH (ref 11.5–15.5)
WBC: 8.1 10*3/uL (ref 4.0–10.5)

## 2021-02-14 LAB — TSH: TSH: 4.15 u[IU]/mL (ref 0.35–5.50)

## 2021-02-14 LAB — PROTIME-INR
INR: 1.2 ratio — ABNORMAL HIGH (ref 0.8–1.0)
Prothrombin Time: 13.4 s — ABNORMAL HIGH (ref 9.6–13.1)

## 2021-02-14 NOTE — Patient Instructions (Addendum)
  Blood work was ordered.     Medications changes include :   none     

## 2021-02-14 NOTE — Assessment & Plan Note (Signed)
Acute Bruising b/l arms and legs and even on her breast - this is new in the past 2 weeks No change in meds/supplements She is on several medications that promote bruising + prednisone + thin skin Has chronic iron def which may be contributing Cbc, iron panel, cmp, PT/inr

## 2021-02-14 NOTE — Assessment & Plan Note (Signed)
Chronic  Clinically euthyroid Currently taking levothyroxine 125 mcg daily Check tsh  Titrate med dose if needed

## 2021-02-14 NOTE — Assessment & Plan Note (Signed)
Chronic Controlled, stable Continue clonazepam 0.5 mg nightly

## 2021-02-15 LAB — IRON,TIBC AND FERRITIN PANEL
%SAT: 14 % — ABNORMAL LOW (ref 16–45)
Ferritin: 29 ng/mL (ref 16–288)
Iron: 42 ug/dL — ABNORMAL LOW (ref 45–160)
TIBC: 311 ug/dL (ref 250–450)

## 2021-02-21 ENCOUNTER — Ambulatory Visit (INDEPENDENT_AMBULATORY_CARE_PROVIDER_SITE_OTHER): Payer: PPO

## 2021-02-21 DIAGNOSIS — I441 Atrioventricular block, second degree: Secondary | ICD-10-CM

## 2021-02-23 ENCOUNTER — Encounter: Payer: Self-pay | Admitting: Internal Medicine

## 2021-02-23 ENCOUNTER — Other Ambulatory Visit: Payer: Self-pay

## 2021-02-23 DIAGNOSIS — L853 Xerosis cutis: Secondary | ICD-10-CM | POA: Diagnosis not present

## 2021-02-23 DIAGNOSIS — R58 Hemorrhage, not elsewhere classified: Secondary | ICD-10-CM | POA: Diagnosis not present

## 2021-02-23 DIAGNOSIS — L918 Other hypertrophic disorders of the skin: Secondary | ICD-10-CM | POA: Diagnosis not present

## 2021-02-23 DIAGNOSIS — L989 Disorder of the skin and subcutaneous tissue, unspecified: Secondary | ICD-10-CM | POA: Diagnosis not present

## 2021-02-23 DIAGNOSIS — D485 Neoplasm of uncertain behavior of skin: Secondary | ICD-10-CM | POA: Diagnosis not present

## 2021-02-23 DIAGNOSIS — L820 Inflamed seborrheic keratosis: Secondary | ICD-10-CM | POA: Diagnosis not present

## 2021-02-23 LAB — CUP PACEART REMOTE DEVICE CHECK
Battery Remaining Longevity: 113 mo
Battery Remaining Percentage: 89 %
Battery Voltage: 3.02 V
Brady Statistic AP VP Percent: 14 %
Brady Statistic AP VS Percent: 2.3 %
Brady Statistic AS VP Percent: 54 %
Brady Statistic AS VS Percent: 29 %
Brady Statistic RA Percent Paced: 16 %
Brady Statistic RV Percent Paced: 67 %
Date Time Interrogation Session: 20220808020021
Implantable Lead Implant Date: 20210204
Implantable Lead Implant Date: 20210204
Implantable Lead Location: 753859
Implantable Lead Location: 753860
Implantable Pulse Generator Implant Date: 20210204
Lead Channel Impedance Value: 400 Ohm
Lead Channel Impedance Value: 530 Ohm
Lead Channel Pacing Threshold Amplitude: 0.625 V
Lead Channel Pacing Threshold Amplitude: 0.75 V
Lead Channel Pacing Threshold Pulse Width: 0.4 ms
Lead Channel Pacing Threshold Pulse Width: 0.5 ms
Lead Channel Sensing Intrinsic Amplitude: 12 mV
Lead Channel Sensing Intrinsic Amplitude: 3.6 mV
Lead Channel Setting Pacing Amplitude: 1 V
Lead Channel Setting Pacing Amplitude: 1.625
Lead Channel Setting Pacing Pulse Width: 0.4 ms
Lead Channel Setting Sensing Sensitivity: 2.5 mV
Pulse Gen Model: 2272
Pulse Gen Serial Number: 9196859

## 2021-02-23 MED ORDER — IPRATROPIUM BROMIDE 0.02 % IN SOLN: 0.5000 mg | Freq: Four times a day (QID) | RESPIRATORY_TRACT | 11 refills | Status: AC | PRN

## 2021-02-28 ENCOUNTER — Other Ambulatory Visit: Payer: Self-pay | Admitting: Student

## 2021-02-28 NOTE — Telephone Encounter (Signed)
She should likely not be on that much oxygen, she can retain CO2 when on higher L flow than needed. I would resume 4L and check O2 a couple times throughout the day to make sure her O2 readings remain >88-90%. If O2 saturation <88% on 4L she can titrate up 1L at a time until she is able to maintain level 88% or higher.

## 2021-02-28 NOTE — Telephone Encounter (Signed)
Patient's daughter sent email  Hi I'm  sending this message for my mom. This is her daughter [name]. I noticed that her oxygen was turned up to 8 L. She was previously on 4 L. I was concerned if that's too high. Can you receive too much oxygen? I don't really see where it has made a difference from 4 to 8.  Should I turn that back down? Thank you, [name]  I did send a response on how to monitor the oxygen and if patient really needs 8L she would need an appointment.   Also sending to Derl Barrow NP for further recommendations. Dr. Vaughan Browner is off today

## 2021-03-02 ENCOUNTER — Encounter: Payer: Self-pay | Admitting: Internal Medicine

## 2021-03-02 NOTE — Telephone Encounter (Signed)
Last RF per controlled substance database: 08/06/20 Last OV: 02/14/21 Next OV: 07/01/21

## 2021-03-03 ENCOUNTER — Telehealth: Payer: Self-pay

## 2021-03-03 MED ORDER — BEVESPI AEROSPHERE 9-4.8 MCG/ACT IN AERO: 2.0000 | INHALATION_SPRAY | Freq: Two times a day (BID) | RESPIRATORY_TRACT | 1 refills | Status: DC

## 2021-03-03 NOTE — Telephone Encounter (Signed)
PA has been started for Ipratropium Bromide.  Key: [facility]

## 2021-03-03 NOTE — Telephone Encounter (Signed)
Received MyChart message from patient stating that her Charolotte Eke would cost $200. She is currently enrolled into the patient assistance program with [employer] but her application has expired. [employer] will be faxing a renewal to our office.   She asked if we had any samples of Bevespi. I informed her that we do not but I would see if we could provide her with a sample of a medication similar to [place].   Dr. Vaughan Browner, would you be ok with [place] providing her with a sample of the medications we currently have? We have Breztri, Anoro, Breo, Stiolto and Spiriva.

## 2021-03-03 NOTE — Telephone Encounter (Signed)
Please give samples of stiolto

## 2021-03-04 ENCOUNTER — Telehealth: Payer: Self-pay | Admitting: *Deleted

## 2021-03-04 MED ORDER — STIOLTO RESPIMAT 2.5-2.5 MCG/ACT IN AERS: 2.0000 | INHALATION_SPRAY | Freq: Every day | RESPIRATORY_TRACT | 0 refills | Status: DC

## 2021-03-04 MED ORDER — BEVESPI AEROSPHERE 9-4.8 MCG/ACT IN AERO: 2.0000 | INHALATION_SPRAY | Freq: Two times a day (BID) | RESPIRATORY_TRACT | 3 refills | Status: AC

## 2021-03-04 NOTE — Telephone Encounter (Signed)
Called and spoke with patient's daughter [name], advised her that I would have MR sign the new script for the [facility] today and fax it to the manufacturer's patient assistance today.  She verbalized understanding.

## 2021-03-04 NOTE — Telephone Encounter (Signed)
Patient's daughter, [name] came to the office for a sample of Stiolto for patient until she receives her shipment in the mail.  She was shown how to use the inhaler and she return demonstrated use of the inhaler.  She verbalized understanding.  Nothing further needed.

## 2021-03-05 MED ORDER — TRAMADOL HCL 50 MG PO TABS: 50.0000 mg | ORAL_TABLET | Freq: Three times a day (TID) | ORAL | 0 refills | Status: AC | PRN

## 2021-03-05 NOTE — Addendum Note (Signed)
Addended by: Binnie Rail on: 03/05/2021 10:13 AM   Modules accepted: Orders

## 2021-03-10 ENCOUNTER — Encounter: Payer: Self-pay | Admitting: Internal Medicine

## 2021-03-10 DIAGNOSIS — J441 Chronic obstructive pulmonary disease with (acute) exacerbation: Secondary | ICD-10-CM | POA: Diagnosis not present

## 2021-03-10 MED ORDER — CLONAZEPAM 0.5 MG PO TABS: 0.5000 mg | ORAL_TABLET | Freq: Every day | ORAL | 3 refills | Status: AC

## 2021-03-10 NOTE — Addendum Note (Signed)
Addended by: Binnie Rail on: 03/10/2021 07:48 PM   Modules accepted: Orders

## 2021-03-10 NOTE — Telephone Encounter (Signed)
Last RF per controlled substance database: 02/10/21 Last OV: 02/14/21 Next OV: 07/01/21

## 2021-03-17 NOTE — Progress Notes (Signed)
Remote pacemaker transmission.   

## 2021-03-22 ENCOUNTER — Other Ambulatory Visit: Payer: Self-pay | Admitting: Internal Medicine

## 2021-03-29 ENCOUNTER — Other Ambulatory Visit: Payer: Self-pay | Admitting: Student

## 2021-03-29 ENCOUNTER — Other Ambulatory Visit: Payer: Self-pay | Admitting: Internal Medicine

## 2021-03-31 DIAGNOSIS — C4359 Malignant melanoma of other part of trunk: Secondary | ICD-10-CM | POA: Diagnosis not present

## 2021-03-31 DIAGNOSIS — L905 Scar conditions and fibrosis of skin: Secondary | ICD-10-CM | POA: Diagnosis not present

## 2021-04-04 ENCOUNTER — Ambulatory Visit (INDEPENDENT_AMBULATORY_CARE_PROVIDER_SITE_OTHER): Payer: PPO | Admitting: Physician Assistant

## 2021-04-04 ENCOUNTER — Other Ambulatory Visit: Payer: Self-pay

## 2021-04-04 ENCOUNTER — Ambulatory Visit (HOSPITAL_COMMUNITY)
Admission: RE | Admit: 2021-04-04 | Discharge: 2021-04-04 | Disposition: A | Discharge status: Home / Self Care | Payer: PPO | Source: Ambulatory Visit | Attending: Surgery | Admitting: Surgery

## 2021-04-04 VITALS — BP 141/75 | HR 71 | Temp 97.3°F | Resp 16 | Ht 64.0 in | Wt 134.0 lb

## 2021-04-04 DIAGNOSIS — K551 Chronic vascular disorders of intestine: Secondary | ICD-10-CM | POA: Diagnosis not present

## 2021-04-04 DIAGNOSIS — I6523 Occlusion and stenosis of bilateral carotid arteries: Secondary | ICD-10-CM
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
## 2021-04-05 NOTE — Telephone Encounter (Signed)
Hello Dr. Vaughan Browner, please advise on mychart message below, thanks!  Good morning, since Mom has moved up to 4 L of oxygen, when we take her out to the doctors with her small tank it runs out before we can get her appointment done and get back home. I was told that there may be a different regulator to put on her mobile tand that will regulate her oxygen at a slower rate. If this is so could you guys write an order and fax it to [facility] [employer]. The fax number is [phone]. Thank you, [name]

## 2021-04-07 ENCOUNTER — Other Ambulatory Visit: Payer: Self-pay

## 2021-04-07 DIAGNOSIS — I6523 Occlusion and stenosis of bilateral carotid arteries: Secondary | ICD-10-CM

## 2021-04-07 DIAGNOSIS — K551 Chronic vascular disorders of intestine: Secondary | ICD-10-CM

## 2021-04-07 NOTE — Telephone Encounter (Signed)
Ok to give order for the new regulator. She will need to come in for a walk with the new regulator to make sure her oxygen levels are adequate.

## 2021-04-10 DIAGNOSIS — J441 Chronic obstructive pulmonary disease with (acute) exacerbation: Secondary | ICD-10-CM | POA: Diagnosis not present

## 2021-04-12 ENCOUNTER — Telehealth: Payer: Self-pay

## 2021-04-12 NOTE — Telephone Encounter (Signed)
Prolia VOB initiated via [email or web address]  Last OV:  Next OV:  Last Prolia inj: 06/30/20 Next Prolia inj DUE: 12/30/20

## 2021-04-14 NOTE — Telephone Encounter (Signed)
Pt ready for scheduling on or after 12/30/20  Out-of-pocket cost due at time of visit: $285  Primary:  Prolia co-insurance: 20% (approximately $255) Admin fee co-insurance: $35  Secondary: n/a Prolia co-insurance:  Admin fee co-insurance:   Deductible: does not apply  Prior Auth: not required PA# Valid:   ** This summary of benefits is an estimation of the patient's out-of-pocket cost. Exact cost may vary based on individual plan coverage.

## 2021-04-17 DIAGNOSIS — R82998 Other abnormal findings in urine: Secondary | ICD-10-CM | POA: Diagnosis not present

## 2021-04-17 DIAGNOSIS — I959 Hypotension, unspecified: Secondary | ICD-10-CM | POA: Diagnosis not present

## 2021-04-17 DIAGNOSIS — J9621 Acute and chronic respiratory failure with hypoxia: Secondary | ICD-10-CM | POA: Diagnosis not present

## 2021-04-17 DIAGNOSIS — Z9981 Dependence on supplemental oxygen: Secondary | ICD-10-CM | POA: Diagnosis not present

## 2021-04-18 ENCOUNTER — Other Ambulatory Visit: Payer: Self-pay | Admitting: Adult Health

## 2021-04-18 ENCOUNTER — Other Ambulatory Visit: Payer: Self-pay | Admitting: Internal Medicine

## 2021-04-22 DIAGNOSIS — Z8679 Personal history of other diseases of the circulatory system: Secondary | ICD-10-CM | POA: Diagnosis not present

## 2021-04-22 DIAGNOSIS — Z87448 Personal history of other diseases of urinary system: Secondary | ICD-10-CM | POA: Diagnosis not present

## 2021-04-22 DIAGNOSIS — Z09 Encounter for follow-up examination after completed treatment for conditions other than malignant neoplasm: Secondary | ICD-10-CM | POA: Diagnosis not present

## 2021-04-25 ENCOUNTER — Ambulatory Visit (INDEPENDENT_AMBULATORY_CARE_PROVIDER_SITE_OTHER): Payer: PPO | Admitting: Pharmacist

## 2021-04-25 ENCOUNTER — Other Ambulatory Visit: Payer: Self-pay

## 2021-04-25 DIAGNOSIS — F3289 Other specified depressive episodes: Secondary | ICD-10-CM

## 2021-04-25 DIAGNOSIS — R413 Other amnesia: Secondary | ICD-10-CM

## 2021-04-25 DIAGNOSIS — I5032 Chronic diastolic (congestive) heart failure: Secondary | ICD-10-CM

## 2021-04-25 DIAGNOSIS — I10 Essential (primary) hypertension: Secondary | ICD-10-CM

## 2021-04-25 DIAGNOSIS — E782 Mixed hyperlipidemia: Secondary | ICD-10-CM

## 2021-04-25 DIAGNOSIS — G609 Hereditary and idiopathic neuropathy, unspecified: Secondary | ICD-10-CM

## 2021-04-25 DIAGNOSIS — J439 Emphysema, unspecified: Secondary | ICD-10-CM

## 2021-04-25 DIAGNOSIS — M810 Age-related osteoporosis without current pathological fracture: Secondary | ICD-10-CM

## 2021-04-25 NOTE — Patient Instructions (Signed)
Visit Information  Phone number for Pharmacist: [phone]   Goals Addressed             This Visit's Progress    Manage My Medicine       Timeframe:  Long-Range Goal Priority:  Medium Start Date:     11/02/20                        Expected End Date:  04/25/21                    Follow Up Date Dec 2022   - call for medicine refill 2 or 3 days before it runs out - call if I am sick and can't take my medicine - keep a list of all the medicines I take; vitamins and herbals too - use a pillbox to sort medicine  -Weigh daily; if >2 lb wt gain overnight or 5 lb wt gain in a week, take furosemide and potassium   Why is this important?   These steps will help you keep on track with your medicines.   Notes:         Care Plan : [place]  Updates made by Charlton Haws, RPH since 04/25/2021 12:00 AM     Problem: Hypertension, Hyperlipidemia, Heart Failure, COPD, Depression, Osteoporosis, and Osteoarthritis   Priority: High     Long-Range Goal: Disease management   Start Date: 11/01/2020  Expected End Date: 04/25/2022  This Visit's Progress: On track  Recent Progress: On track  Priority: High  Note:   Current Barriers:  Unable to independently afford treatment regimen Unable to independently monitor therapeutic efficacy  Pharmacist Clinical Goal(s):  Patient will verbalize ability to afford treatment regimen achieve adherence to monitoring guidelines and medication adherence to achieve therapeutic efficacy through collaboration with PharmD and provider.   Interventions: 1:1 collaboration with Binnie Rail, MD regarding development and update of comprehensive plan of care as evidenced by provider attestation and co-signature Inter-disciplinary care team collaboration (see longitudinal plan of care) Comprehensive medication review performed; medication list updated in electronic medical record  Hypertension / diastolic HF (BP goal  <751/02) -Controlled - pt has not been taking furosemide and KCl for ~2 weeks due to worsening urinary incontinence; urinary issues have improved slightly; denies excess swelling; pt reports a nurse from [facility] has been in to check on her since stopping lasix and reported no swelling and no abnormal heart/lung sounds on exam; nurse also got a low BP (SBP in 90s) when she checked -Last ECHO: 08/20/2019 -  EF 58-52%, grade 1 diastolic dysfunction -Current treatment: Metoprolol tartrate 25 mg daily Furosemide 40 mg MWF - not taking -Educated on BP goals and benefits of medications for prevention of heart attack, stroke and kidney damage; -Counseled to monitor BP at home periodically -Advised pt to weigh herself daily and monitor closely for signs of swelling; if gaining >2 lbs overnight, take a dose of Lasix and KCl  Hyperlipidemia: (LDL goal < 100) -Controlled - LDL is at goal; pt endorses compliance with statin and denies issues -Current treatment: Pravastatin 40 mg daily -Educated on Cholesterol goals; Benefits of statin for ASCVD risk reduction; -Recommended to continue current medication  COPD (Goal: control symptoms and prevent exacerbations) -Controlled - pt has severe COPD but has been relatively controlled over last several months and overall improved since starting daily predinosone -hx Stage 1 SCLC; quit smoking Jan 2021 -Pulmonary function testing: 05/21/2019 (  FEV1 51% predicted, FEV1/FVC 0.53) -Gold Grade: Gold 2 (FEV1 50-79%) -Current COPD Classification:  D (high sx, >/=2 exacerbations/yr) -MMRC/CAT score: 22 (06/2020) -Exacerbations requiring treatment in last 6 months: 1 -Current treatment  Bevespi (glycopyrrolate-formoterol) 2 puffs BID Prednisone 10 mg daily Ipratropium 0.02% nebulizer soln Prednisone 10 mg daily Oxygen 3-4 L -Patient reports consistent use of maintenance inhaler -Frequency of rescue inhaler use: infrequent -Counseled on Proper inhaler technique; Benefits  of consistent maintenance inhaler use -Recommended to continue current medication  Depression/Anxiety (Goal: manage symptoms) -Controlled - pt reports mood improved since increasing sertraline -Current treatment: Sertraline 100 mg daily Clonazepam 0.5 mg HS  Donepezil 10 mg HS -PHQ9: 3  (10/2020) -Connected with PCP for mental health support -Recommended to continue current medication  Osteoporosis (Goal prevent fractures) -Not ideally controlled - pt is overdue for Prolia dose; per chart review Prolia copay will be ~$285; pt reports she cannot afford that -pt has history of compression fractures, hip fracture Jan 2022, healing well from hip replacement -Last DEXA Scan: 04/08/20   T-Score femoral neck: -2.9  T-Score lumbar spine: -0.9 -Patient is a candidate for pharmacologic treatment due to T-Score < -2.5 in femoral neck -Current treatment  Prolia q6 months (last given 06/30/2020) Calcium-Mg-Vitamin D - 2 daily -Consider oral bisphophosonate; per insurance zoledronic acid and Forteo are non-formulary and would require failure of bisphosphonate  Chronic pain (Goal: manage pain) -Not ideally controlled - pt's daughter reports occasional falls and pt slurring her words; a nurse from insurance came to evaluate her and did not think it was stroke-related, but could be polypharmacy; pt is taking gabapentin, tramadol and clonazepam regularly; pt's daughter has recently limited her access to clonazepam and tramadol -Hx Peripheral neuropathy, osteoarthritis, hx compression fracture -Current treatment  Gabapentin 300 mg - 1 AM, 1 PM, 2 HS Tramadol 50 mg q8h PRN  Tylenol 325 mg PRN - Tylenol  -Advised to use lowest effective amount of pain management drugs + clonazepam;  -Recommended to take Tylenol regularly for pain, up to 3000 mg/day  Urinary incontinence (Goal: manage symptoms) -Uncontrolled - new problem - pt's daughter reports patient has been struggling with urinary accidents and not  making it to the bathroom; she stopped taking furosemide 1-2 weeks ago and symptoms have improved slightly but still sometimes does not make it;  -Discussed medication options; would try to avoid anticholinergic options given cognitive issues; Myrbetriq or [place] would be good options if pt can afford;  -Scheduled PCP visit to discuss new problem  Health Maintenance -Vaccine gaps: Shingrix, covid booster, flu -Advised to get missing vaccines  Patient Goals/Self-Care Activities Patient will:  - take medications as prescribed -focus on medication adherence by pill box -weigh daily and monitor for swelling; take furosemide with Kcl if wt gain > 2 lbs overnight or 5 lbs in a week -Keep appt with PCP 10/13      Patient verbalizes understanding of instructions provided today and agrees to view in [place].  Telephone follow up appointment with pharmacy team member scheduled for: 2 months  Charlene Brooke, PharmD, [place], CPP Clinical Pharmacist [facility] [phone]

## 2021-04-25 NOTE — Progress Notes (Signed)
Chronic Care Management Pharmacy Note  04/25/2021 Name:  Brandi Raymond MRN:  [id number] DOB:  [birth year]  Summary: -Pt has stopped furosemide/potassium due to urinary incontinence 1-2 weeks ago; reports slight improvement in urinary issues and denies increased swelling/wt gain -Pt is due for Prolia (last given 06/2020) but reports she cannot afford the copay ($285) -Pt's daughter reports recent issues with falls/slurring words - she is taking gabapentin, tramadol, clonazepam  Recommendations/Changes made from today's visit: -Advised pt to weigh daily and monitor for swelling; take furosemide with Kcl if wt gain > 2 lbs overnight or 5 lbs in a week -Scheduled PCP visit 10/13 to address urinary issues -Consider Mybetriq if pt can afford Tier 3 copay; recommend to avoid anticholinergics if possible due to hx of memory deficit -Consider oral bisphosphonate to replace Prolia (Forteo or zoledronic acid infusion would be ideal, however it is non-formulary and would require failure of bisphosphonate first)   Subjective: Brandi Raymond is an 87 y.o. year old female who is a primary patient of Burns, Claudina Lick, MD.  The CCM team was consulted for assistance with disease management and care coordination needs.    Engaged with patient by telephone for follow up visit in response to provider referral for pharmacy case management and/or care coordination services.   Consent to Services:  The patient was given information about Chronic Care Management services, agreed to services, and gave verbal consent prior to initiation of services.  Please see initial visit note for detailed documentation.   Patient Care Team: Binnie Rail, MD as PCP - General (Internal Medicine) Lorretta Harp, MD as PCP - Cardiology (Cardiology) [name], MD as PCP - Electrophysiology (Cardiology) Minus Breeding, MD as Consulting Physician (Cardiology) Kathrynn Ducking, MD as Consulting Physician  (Neurology) Warden Fillers, MD as Consulting Physician (Ophthalmology) Charlton Haws, [facility] as Pharmacist (Pharmacist)  Patient lives at home with her husband. Her daughters live nearby and between them are able to visit every day to check in with her.  Recent office visits: 02/14/21 Dr Quay Burow OV: acute visit for bruising; mild anemia, mildly low platelets, mild iron deficiency; bruising is likely med-induced (Eliquis, prednisone) and will continue  12/29/20 Dr. Quay Burow Internal Medicine ([facility]) increased sertraline to 100 mg daily  Recommend seeing dermatologist and 6 month F/U  10/05/20 NP Jodi Mourning OV: cough,  COPD exacerbation. Xray normal. Rx' d doxycycline.  08/10/20 Dr Quay Burow OV: hospital f/u s/p hip fracture and THA. C/o depression, tapered duloxetine and started sertraline.  Recent consult visits: 04/04/21 PA Rhyne (vasc surg): f/u mesenteric ischemia; US reveals stenosis in SMA stent, stable. Sx do not correlate with mesenteric ischemia; advised bowel regimen;   11/24/20 Dr. Julien Nordmann (Oncology): Malignant neoplasm of lungs  10/28/20 Dr Erlinda Hong (ortho): hip fracture s/p L THA (3 months). 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  10/27/20 Ward Givens (neurology): f/u memory issues, neuropahty, ocular MG. Recent fall, ordered CT scan - normal results.  10/11/20 Dr Durene Fruits (vascular): f/u carotid stenosis. Mesenteric stenosis - advised bowel regimen  09/01/20 Dr Erlinda Hong (orhto): knee injection given.  08/11/20 Dr Vaughan Browner (pulmonary): f/u COPD. Started chronic prednisone in 2021. Pending palliative care referral.  Hospital visits: None in previous 6 months.  Objective:  Lab Results  Component Value Date   CREATININE 1.20 02/14/2021   BUN 24 (H) 02/14/2021   GFR 41.26 (L) 02/14/2021   GFRNONAA 54 (L) 11/22/2020   GFRAA 70 03/04/2020   NA 144 02/14/2021  K 4.1 02/14/2021   CALCIUM 9.1 02/14/2021   CO2 34 (H) 02/14/2021   GLUCOSE 100 (H) 02/14/2021    Lab  Results  Component Value Date/Time   HGBA1C 6.0 (H) 01/11/2020 10:01 AM   HGBA1C 6.0 12/13/2017 11:09 AM   GFR 41.26 (L) 02/14/2021 09:35 AM   GFR 54.00 (L) 01/29/2020 10:24 AM   MICROALBUR 0.2 09/11/2006 04:31 PM    Last diabetic Eye exam:  Lab Results  Component Value Date/Time   HMDIABEYEEXA No Retinopathy 09/26/2012 12:00 AM    Last diabetic Foot exam: No results found for: HMDIABFOOTEX   Lab Results  Component Value Date   CHOL 200 12/13/2017   HDL 92.70 12/13/2017   LDLCALC 83 12/13/2017   LDLDIRECT 98.7 08/13/2007   TRIG 120.0 12/13/2017   CHOLHDL 2 12/13/2017    Hepatic Function Latest Ref Rng & Units 02/14/2021 11/22/2020 07/21/2020  Total Protein 6.0 - 8.3 g/dL 6.5 5.9(L) 5.6(L)  Albumin 3.5 - 5.2 g/dL 3.9 3.2(L) 2.2(L)  AST 0 - 37 U/L '26 26 25  ' ALT 0 - 35 U/L '15 14 10  ' Alk Phosphatase 39 - 117 U/L 40 45 86  Total Bilirubin 0.2 - 1.2 mg/dL 0.4 0.3 0.8  Bilirubin, Direct 0.0 - 0.3 mg/dL - - -    Lab Results  Component Value Date/Time   TSH 4.15 02/14/2021 09:35 AM   TSH 0.820 07/16/2020 02:21 PM   TSH 0.677 09/03/2019 03:26 AM   TSH 2.35 05/30/2018 10:55 AM   FREET4 1.04 07/22/2010 09:48 AM   FREET4 1.13 02/15/2010 12:47 PM    CBC Latest Ref Rng & Units 02/14/2021 11/22/2020 07/22/2020  WBC 4.0 - 10.5 K/uL 8.1 8.6 8.9  Hemoglobin 12.0 - 15.0 g/dL 11.0(L) 10.8(L) 9.3(L)  Hematocrit 36.0 - 46.0 % 34.9(L) 36.7 30.4(L)  Platelets 150.0 - 400.0 K/uL 136.0(L) 134(L) 206    Lab Results  Component Value Date/Time   VD25OH 44.56 05/30/2018 10:55 AM   VD25OH 41.05 05/25/2016 09:41 AM    Clinical ASCVD: Yes  The ASCVD Risk score (Arnett DK, et al., 2019) failed to calculate for the following reasons:   The 2019 ASCVD risk score is only valid for ages 69 to 80   The patient has a prior MI or stroke diagnosis    Depression screen [facility] 2/9 11/12/2020 06/30/2020  Decreased Interest 1 0  Down, Depressed, Hopeless 2 0  PHQ - 2 Score 3 0  Some recent data might be hidden     CHA2DS2-VASc Score = 6  The patient's score is based upon: CHF History: 1 HTN History: 1 Diabetes History: 0 Stroke History: 0 Vascular Disease History: 1 Age Score: 2 Gender Score: 1      Social History   Tobacco Use  Smoking Status Former   Packs/day: 1.00   Years: 60.00   Pack years: 60.00   Types: Cigarettes   Quit date: 07/2019   Years since quitting: 1.7  Smokeless Tobacco Never  Tobacco Comments   Successfully quit   BP Readings from Last 3 Encounters:  04/04/21 (!) 141/75  02/14/21 126/60  12/29/20 114/78   Pulse Readings from Last 3 Encounters:  04/04/21 71  02/14/21 82  12/29/20 62   Wt Readings from Last 3 Encounters:  04/04/21 134 lb (60.8 kg)  02/14/21 133 lb (60.3 kg)  12/29/20 135 lb (61.2 kg)   BMI Readings from Last 3 Encounters:  04/04/21 23.00 kg/m  02/14/21 22.83 kg/m  12/29/20 23.17 kg/m  Assessment/Interventions: Review of patient past medical history, allergies, medications, health status, including review of consultants reports, laboratory and other test data, was performed as part of comprehensive evaluation and provision of chronic care management services.   SDOH:  (Social Determinants of Health) assessments and interventions performed: Yes   SDOH Screenings   Alcohol Screen: Low Risk    Last Alcohol Screening Score (AUDIT): 0  Depression (PHQ2-9): Low Risk    PHQ-2 Score: 3  Financial Resource Strain: Low Risk    Difficulty of Paying Living Expenses: Not hard at all  Food Insecurity: No Food Insecurity   Worried About Charity fundraiser in the Last Year: Never true   Ran Out of Food in the Last Year: Never true  Housing: Low Risk    Last Housing Risk Score: 0  Physical Activity: Inactive   Days of Exercise per Week: 0 days   Minutes of Exercise per Session: 0 min  Social Connections: Not on file  Stress: No Stress Concern Present   Feeling of Stress : Not at all  Tobacco Use: Medium Risk   Smoking Tobacco  Use: Former   Smokeless Tobacco Use: Never  Transportation Needs: No Data processing manager (Medical): No   Lack of Transportation (Non-Medical): No    CCM Care Plan  Allergies  Allergen Reactions   Silver Sulfadiazine Other (See Comments)    lowers white blood count     Medications Reviewed Today     Reviewed by Charlton Haws, [facility] (Pharmacist) on 04/25/21 at 1300  Med List Status: <None>   Medication Order Taking? Sig Documenting Provider Last Dose Status Informant  acetaminophen (TYLENOL) 325 MG tablet [id number] Yes Take 1-2 tablets (325-650 mg total) by mouth every 6 (six) hours as needed for mild pain, moderate pain or fever. Total acetaminophen dose from all sources not to exceed 4 g/day. Modena Jansky, MD Taking Active   apixaban (ELIQUIS) 5 MG TABS tablet [id number] Yes Take 1 tablet (5 mg total) by mouth 2 (two) times daily. Lorretta Harp, MD Taking Active   Calcium-Magnesium-Vitamin D (CALCIUM 1200+D3 PO) [id number] Yes Take 1 tablet by mouth at bedtime.  [provider] Taking Active Child  clonazePAM (KLONOPIN) 0.5 MG tablet [id number] Yes Take 1 tablet (0.5 mg total) by mouth at bedtime. Binnie Rail, MD Taking Active   cyanocobalamin 1000 MCG tablet [id number] Yes Take 1 tablet (1,000 mcg total) by mouth daily. Binnie Rail, MD Taking Active   docusate sodium (COLACE) 100 MG capsule [id number] Yes Take 100 mg by mouth at bedtime.  [provider] Taking Active Child           Med Note Alfonse Spruce, ANH T   Thu Jul 15, 2020  6:48 PM)    donepezil (ARICEPT) 10 MG tablet [id number] Yes TAKE 1 TABLET BY MOUTH EACH NIGHT AT BEDTIME Ward Givens, NP Taking Active   ferrous sulfate 325 (65 FE) MG tablet [id number] Yes Take 1 tablet (325 mg total) by mouth daily with breakfast. Modena Jansky, MD Taking Active   furosemide (LASIX) 40 MG tablet [id number] No Take 1 tablet (40 mg total) by mouth every other day. Please make overdue  appt with Dr. Rayann Heman before anymore refills. Thank you 2nd attempt  Patient not taking: Reported on 04/25/2021   [name], MD Not Taking Active   gabapentin (NEURONTIN) 300 MG capsule [id number] Yes TAKE 1 CAPSULE BY MOUTH TWICE DAILY AT Lenor Derrick, NP  Taking Active   Discontinued 38/25/05 3976 (Duplicate)   Glycopyrrolate-Formoterol (BEVESPI AEROSPHERE) 9-4.8 MCG/ACT AERO [id number] Yes Inhale 2 puffs into the lungs 2 (two) times daily. Brand Males, MD Taking Active   ipratropium (ATROVENT) 0.02 % nebulizer solution [id number] Yes Take 2.5 mLs (0.5 mg total) by nebulization every 6 (six) hours as needed for wheezing or shortness of breath. Binnie Rail, MD Taking Active   levothyroxine (SYNTHROID) 125 MCG tablet [id number] Yes TAKE 1 TABLET BY MOUTH ONCE DAILY BEFOREBREAKFAST Binnie Rail, MD Taking Active   metoprolol tartrate (LOPRESSOR) 25 MG tablet [id number] Yes Take 1 tablet (25 mg total) by mouth daily. Shirley Friar, PA-C Taking Active   Multiple Vitamins-Minerals (PRESERVISION AREDS 2 PO) [id number] Yes Take 1 capsule by mouth 2 (two) times daily. [provider] Taking Active Child           Med Note Alfonse Spruce, ANH T   Thu Jul 15, 2020  6:58 PM)    pantoprazole (PROTONIX) 40 MG tablet [id number] Yes Take 1 tablet (40 mg total) by mouth 2 (two) times daily before a meal. Burns, Claudina Lick, MD Taking Active   potassium chloride (KLOR-CON) 10 MEQ tablet [id number] No TAKE 1 TABLET BY MOUTH EVERY OTHER DAY WITH FUROSEMIDE  Patient not taking: Reported on 04/25/2021   [name], MD Not Taking Active   pravastatin (PRAVACHOL) 40 MG tablet [id number] Yes TAKE 1 TABLET BY MOUTH DAILY Burns, Claudina Lick, MD Taking Active   predniSONE (DELTASONE) 10 MG tablet [id number] Yes TAKE 1 TABLET BY MOUTH DAILY WITH BREAKFAST Mannam, Praveen, MD Taking Active   PROLIA 60 MG/ML SOSY injection [id number] No Inject 60 mg into the skin every 6 (six) months.   Patient not  taking: Reported on 04/25/2021   [provider] Not Taking Active Child           Med Note Luna Glasgow Apr 25, 2021 12:59 PM) Last dose 06/2020  sertraline (ZOLOFT) 100 MG tablet [id number] Yes Take 1 tablet (100 mg total) by mouth daily. Binnie Rail, MD Taking Active   traMADol Veatrice Bourbon) 50 MG tablet [id number] Yes Take 1 tablet (50 mg total) by mouth every 8 (eight) hours as needed for severe pain. For chronic back pain Binnie Rail, MD Taking Active   Med List Note Felton Clinton, [facility] 07/15/20 1900): Zhana Jeangilles (Daughter) - [phone]            Patient Active Problem List   Diagnosis Date Noted   Abnormal bruising 02/14/2021   Aortic atherosclerosis ([place]) 12/28/2020   Wedge compression fracture of first lumbar vertebra, initial encounter for closed fracture ([place]) 08/19/2020   T12 compression fracture, with routine healing, subsequent encounter 08/10/2020   Chronic back pain 08/06/2020   Displaced fracture of left femoral neck ([place]) 07/17/2020   Closed left hip fracture ([place]) 07/15/2020   Goals of care, counseling/discussion 06/16/2020   Large hiatal hernia 04/07/2020   Dysphagia 04/07/2020   Pacemaker 03/16/2020   Chronic anticoagulation 03/16/2020   Encounter for medication review and counseling 03/15/2020   Gastritis with hemorrhage 03/04/2020   PNA (pneumonia) 02/12/2020   Right lower lobe pneumonia 01/13/2020   CAP (community acquired pneumonia) 51/70/0174   Acute diastolic CHF (congestive heart failure) ([place]) 09/02/2019   Paroxysmal atrial fibrillation (HCC)    Second degree Mobitz II AV block 08/21/2019   Second degree AV block 08/08/2019   SOB (shortness of breath) 08/05/2019   Chest  tightness 08/05/2019   GERD (gastroesophageal reflux disease) 05/21/2019   Chronic respiratory failure with hypoxia ([place]) 05/21/2019   Medication management 05/21/2019   Stage I squamous cell carcinoma of right lung ([place]) 02/06/2019   Constipation  09/11/2018   COPD exacerbation ([place]) 09/04/2018   Right lower lobe lung mass 09/04/2018   Closed compression fracture of L1 lumbar vertebra, initial encounter ([place]) 09/04/2018   Preoperative clearance 07/19/2018   Lower back pain 06/25/2018   Cubital tunnel syndrome on left 05/22/2018   Dupuytren's contracture of both hands 05/10/2018   Primary osteoarthritis of both knees 05/10/2018   Difficulty urinating 05/10/2018   Osteoporosis 06/14/2017   Prediabetes 06/23/2015   Depression 06/23/2015   Carotid stenosis 10/12/2014   Sleep disorder 04/09/2014   Dizziness 01/20/2013   Mesenteric artery stenosis ([place]) 01/13/2013   Thoracic aneurysm without mention of rupture 11/04/2012   Chronic mesenteric ischemia ([place]) 10/08/2012   Memory deficit 06/20/2012   Irritable bowel syndrome 06/20/2012   Ocular myasthenia gravis ([place]) 06/20/2012   PVD (peripheral vascular disease) ([place]) 06/20/2012   Hereditary and idiopathic peripheral neuropathy 05/22/2012   Syncope 08/28/2011   Anemia 07/22/2010   ABDOMINAL BRUIT 08/17/2009   Hypothyroidism 05/26/2008   VITAMIN D DEFICIENCY 05/26/2008   CHOLELITHIASIS 12/06/2007   DIVERTICULOSIS, COLON 12/05/2007   HYPERLIPIDEMIA 08/19/2007   COPD (chronic obstructive pulmonary disease) with emphysema ([place]) 08/19/2007   CIGARETTE SMOKER 02/06/2007   Essential hypertension 02/06/2007   COLONIC POLYPS 11/21/2004    Immunization History  Administered Date(s) Administered   Fluad Quad(high Dose 65+) 04/18/2019, 06/30/2020   Influenza, High Dose Seasonal PF 04/09/2014, 06/23/2015, 06/11/2017, 05/10/2018   Influenza, Seasonal, Injecte, Preservative Fre 06/20/2012   Influenza,inj,Quad PF,6+ Mos 05/22/2013   Janssen (J&J) SARS-COV-2 Vaccination 10/25/2019   Pneumococcal Conjugate-13 12/06/2016   Pneumococcal Polysaccharide-23 09/26/2013, 08/13/2015   Tdap 11/13/2012    Conditions to be addressed/monitored:  Hypertension, Hyperlipidemia, Heart Failure, COPD,  Depression, Osteoporosis, and Osteoarthritis  Care Plan : [place]  Updates made by Charlton Haws, [place] since 04/25/2021 12:00 AM     Problem: Hypertension, Hyperlipidemia, Heart Failure, COPD, Depression, Osteoporosis, and Osteoarthritis   Priority: High     Long-Range Goal: Disease management   Start Date: 11/01/2020  Expected End Date: 04/25/2022  This Visit's Progress: On track  Recent Progress: On track  Priority: High  Note:   Current Barriers:  Unable to independently afford treatment regimen Unable to independently monitor therapeutic efficacy  Pharmacist Clinical Goal(s):  Patient will verbalize ability to afford treatment regimen achieve adherence to monitoring guidelines and medication adherence to achieve therapeutic efficacy through collaboration with PharmD and provider.   Interventions: 1:1 collaboration with Binnie Rail, MD regarding development and update of comprehensive plan of care as evidenced by provider attestation and co-signature Inter-disciplinary care team collaboration (see longitudinal plan of care) Comprehensive medication review performed; medication list updated in electronic medical record  Hypertension / diastolic HF (BP goal <073/71) -Controlled - pt has not been taking furosemide and KCl for ~2 weeks due to worsening urinary incontinence; urinary issues have improved slightly; denies excess swelling; pt reports a nurse from [facility] has been in to check on her since stopping lasix and reported no swelling and no abnormal heart/lung sounds on exam; nurse also got a low BP (SBP in 90s) when she checked -Last ECHO: 08/20/2019 -  EF 06-26%, grade 1 diastolic dysfunction -Current treatment: Metoprolol tartrate 25 mg daily Furosemide 40 mg MWF - not taking -Educated on  BP goals and benefits of medications for prevention of heart attack, stroke and kidney damage; -Counseled to monitor BP at home periodically -Advised pt to weigh  herself daily and monitor closely for signs of swelling; if gaining >2 lbs overnight, take a dose of Lasix and KCl  Hyperlipidemia: (LDL goal < 100) -Controlled - LDL is at goal; pt endorses compliance with statin and denies issues -Current treatment: Pravastatin 40 mg daily -Educated on Cholesterol goals; Benefits of statin for ASCVD risk reduction; -Recommended to continue current medication  COPD (Goal: control symptoms and prevent exacerbations) -Controlled - pt has severe COPD but has been relatively controlled over last several months and overall improved since starting daily predinosone -hx Stage 1 SCLC; quit smoking Jan 2021 -Pulmonary function testing: 05/21/2019 (FEV1 51% predicted, FEV1/FVC 0.53) -Gold Grade: Gold 2 (FEV1 50-79%) -Current COPD Classification:  D (high sx, >/=2 exacerbations/yr) -MMRC/CAT score: 22 (06/2020) -Exacerbations requiring treatment in last 6 months: 1 -Current treatment  Bevespi (glycopyrrolate-formoterol) 2 puffs BID Prednisone 10 mg daily Ipratropium 0.02% nebulizer soln Prednisone 10 mg daily Oxygen 3-4 L -Patient reports consistent use of maintenance inhaler -Frequency of rescue inhaler use: infrequent -Counseled on Proper inhaler technique; Benefits of consistent maintenance inhaler use -Recommended to continue current medication  Depression/Anxiety (Goal: manage symptoms) -Controlled - pt reports mood improved since increasing sertraline -Current treatment: Sertraline 100 mg daily Clonazepam 0.5 mg HS  Donepezil 10 mg HS -PHQ9: 3  (10/2020) -Connected with PCP for mental health support -Recommended to continue current medication  Osteoporosis (Goal prevent fractures) -Not ideally controlled - pt is overdue for Prolia dose; per chart review Prolia copay will be ~$285; pt reports she cannot afford that -pt has history of compression fractures, hip fracture Jan 2022, healing well from hip replacement -Last DEXA Scan: 04/08/20   T-Score  femoral neck: -2.9  T-Score lumbar spine: -0.9 -Patient is a candidate for pharmacologic treatment due to T-Score < -2.5 in femoral neck -Current treatment  Prolia q6 months (last given 06/30/2020) Calcium-Mg-Vitamin D - 2 daily -Consider oral bisphophosonate; per insurance zoledronic acid and Forteo are non-formulary and would require failure of bisphosphonate  Chronic pain (Goal: manage pain) -Not ideally controlled - pt's daughter reports occasional falls and pt slurring her words; a nurse from insurance came to evaluate her and did not think it was stroke-related, but could be polypharmacy; pt is taking gabapentin, tramadol and clonazepam regularly; pt's daughter has recently limited her access to clonazepam and tramadol -Hx Peripheral neuropathy, osteoarthritis, hx compression fracture -Current treatment  Gabapentin 300 mg - 1 AM, 1 PM, 2 HS Tramadol 50 mg q8h PRN  Tylenol 325 mg PRN - Tylenol  -Advised to use lowest effective amount of pain management drugs + clonazepam;  -Recommended to take Tylenol regularly for pain, up to 3000 mg/day  Urinary incontinence (Goal: manage symptoms) -Uncontrolled - new problem - pt's daughter reports patient has been struggling with urinary accidents and not making it to the bathroom; she stopped taking furosemide 1-2 weeks ago and symptoms have improved slightly but still sometimes does not make it;  -Discussed medication options; would try to avoid anticholinergic options given cognitive issues; Myrbetriq (Tier 3) would be a good option if pt can afford; tolterodine would be next best option that is covered at Tier 2 -Scheduled PCP visit to discuss new problem  Health Maintenance -Vaccine gaps: Shingrix, covid booster, flu -Advised to get missing vaccines  Patient Goals/Self-Care Activities Patient will:  - take medications as prescribed -focus  on medication adherence by pill box -weigh daily and monitor for swelling; take furosemide with Kcl  if wt gain > 2 lbs overnight or 5 lbs in a week -Keep appt with PCP 10/13      Compliance/Adherence/Medication fill history: Care Gaps: None  Star-Rating Drugs: Pravastatin - LF 03/22/21 x 90 ds  Medication Assistance:  -Eliquis PAP (BMS) -approved through 07/16/21 -Bevespi (AZ&Me) - approved through 07/16/21  Patient's preferred pharmacy is:  [facility], [address] [place] [address] Phone: [phone] Fax: [phone]  [id number] [place] [address]: [phone]: [phone] [place] [address] [place]Phone: [phone]: [phone] Uses pill box? Yes - daughter sets up for her Pt endorses 100% compliance  We discussed: Current pharmacy is preferred with insurance plan and patient is satisfied with pharmacy services Patient decided to: Continue current medication management strategy  Care Plan and Follow Up Patient Decision:  Patient agrees to Care Plan and Follow-up.  Plan: Telephone follow up appointment with care management team member scheduled for:  2 months  LCharlene Brooke PharmD, BMc[place] CPP Clinical Pharmacist [phone]

## 2021-04-27 NOTE — Progress Notes (Signed)
Subjective:    Patient ID: Brandi Raymond, female    DOB: March 28, 1934, 87 y.o.   MRN: [id number]  This visit occurred during the SARS-CoV-2 public health emergency.  Safety protocols were in place, including screening questions prior to the visit, additional usage of staff PPE, and extensive cleaning of exam room while observing appropriate contact time as indicated for disinfecting solutions.    HPI The patient is here for an acute visit.   Urination issues - she urinates frequently and can not make it to the bathroom.  She has urgency.  Goes maybe 5 times during the day.  She goes once overnight, but does not sleep long at night   She is no longer taking the lasix.  That has helped some.    Prolia too expensive -- she wonders what alternatives there are.     Medications and allergies reviewed with patient and updated if appropriate.  Patient Active Problem List   Diagnosis Date Noted   Abnormal bruising 02/14/2021   Aortic atherosclerosis ([place]) 12/28/2020   Wedge compression fracture of first lumbar vertebra, initial encounter for closed fracture ([place]) 08/19/2020   T12 compression fracture, with routine healing, subsequent encounter 08/10/2020   Chronic back pain 08/06/2020   Displaced fracture of left femoral neck ([name]) 07/17/2020   Closed left hip fracture ([place]) 07/15/2020   Goals of care, counseling/discussion 06/16/2020   Large hiatal hernia 04/07/2020   Dysphagia 04/07/2020   Pacemaker 03/16/2020   Chronic anticoagulation 03/16/2020   Encounter for medication review and counseling 03/15/2020   Gastritis with hemorrhage 03/04/2020   PNA (pneumonia) 02/12/2020   Right lower lobe pneumonia 01/13/2020   CAP (community acquired pneumonia) 50/53/9767   Acute diastolic CHF (congestive heart failure) ([place]) 09/02/2019   Paroxysmal atrial fibrillation (HCC)    Second degree Mobitz II AV block 08/21/2019   Second degree AV block 08/08/2019   SOB (shortness of breath)  08/05/2019   Chest tightness 08/05/2019   GERD (gastroesophageal reflux disease) 05/21/2019   Chronic respiratory failure with hypoxia ([place]) 05/21/2019   Medication management 05/21/2019   Stage I squamous cell carcinoma of right lung ([place]) 02/06/2019   Constipation 09/11/2018   COPD exacerbation ([place]) 09/04/2018   Right lower lobe lung mass 09/04/2018   Closed compression fracture of L1 lumbar vertebra, initial encounter ([place]) 09/04/2018   Preoperative clearance 07/19/2018   Lower back pain 06/25/2018   Cubital tunnel syndrome on left 05/22/2018   Dupuytren's contracture of both hands 05/10/2018   Primary osteoarthritis of both knees 05/10/2018   Difficulty urinating 05/10/2018   Osteoporosis 06/14/2017   Prediabetes 06/23/2015   Depression 06/23/2015   Carotid stenosis 10/12/2014   Sleep disorder 04/09/2014   Dizziness 01/20/2013   Mesenteric artery stenosis ([place]) 01/13/2013   Thoracic aneurysm without mention of rupture 11/04/2012   Chronic mesenteric ischemia ([place]) 10/08/2012   Memory deficit 06/20/2012   Irritable bowel syndrome 06/20/2012   Ocular myasthenia gravis ([place]) 06/20/2012   PVD (peripheral vascular disease) ([place]) 06/20/2012   Hereditary and idiopathic peripheral neuropathy 05/22/2012   Syncope 08/28/2011   Anemia 07/22/2010   ABDOMINAL BRUIT 08/17/2009   Hypothyroidism 05/26/2008   VITAMIN D DEFICIENCY 05/26/2008   CHOLELITHIASIS 12/06/2007   DIVERTICULOSIS, COLON 12/05/2007   HYPERLIPIDEMIA 08/19/2007   COPD (chronic obstructive pulmonary disease) with emphysema ([place]) 08/19/2007   CIGARETTE SMOKER 02/06/2007   Essential hypertension 02/06/2007   COLONIC POLYPS 11/21/2004    Current Outpatient Medications on File Prior to Visit  Medication  Sig Dispense Refill   acetaminophen (TYLENOL) 325 MG tablet Take 1-2 tablets (325-650 mg total) by mouth every 6 (six) hours as needed for mild pain, moderate pain or fever. Total acetaminophen dose from all sources not  to exceed 4 g/day.     apixaban (ELIQUIS) 5 MG TABS tablet Take 1 tablet (5 mg total) by mouth 2 (two) times daily. 180 tablet 3   Calcium-Magnesium-Vitamin D (CALCIUM 1200+D3 PO) Take 1 tablet by mouth at bedtime.      clonazePAM (KLONOPIN) 0.5 MG tablet Take 1 tablet (0.5 mg total) by mouth at bedtime. 30 tablet 3   cyanocobalamin 1000 MCG tablet Take 1 tablet (1,000 mcg total) by mouth daily. 90 tablet 3   docusate sodium (COLACE) 100 MG capsule Take 100 mg by mouth at bedtime.      donepezil (ARICEPT) 10 MG tablet TAKE 1 TABLET BY MOUTH EACH NIGHT AT BEDTIME 90 tablet 1   ferrous sulfate 325 (65 FE) MG tablet Take 1 tablet (325 mg total) by mouth daily with breakfast.     furosemide (LASIX) 40 MG tablet Take 1 tablet (40 mg total) by mouth every other day. Please make overdue appt with Dr. Rayann Heman before anymore refills. Thank you 2nd attempt 8 tablet 0   gabapentin (NEURONTIN) 300 MG capsule TAKE 1 CAPSULE BY MOUTH TWICE DAILY AT 2NIGHTLY 360 capsule 1   Glycopyrrolate-Formoterol (BEVESPI AEROSPHERE) 9-4.8 MCG/ACT AERO Inhale 2 puffs into the lungs 2 (two) times daily. 3 each 3   ipratropium (ATROVENT) 0.02 % nebulizer solution Take 2.5 mLs (0.5 mg total) by nebulization every 6 (six) hours as needed for wheezing or shortness of breath. 75 mL 11   levothyroxine (SYNTHROID) 125 MCG tablet TAKE 1 TABLET BY MOUTH ONCE DAILY BEFOREBREAKFAST 90 tablet 1   metoprolol tartrate (LOPRESSOR) 25 MG tablet Take 1 tablet (25 mg total) by mouth daily. 30 tablet 0   Multiple Vitamins-Minerals (PRESERVISION AREDS 2 PO) Take 1 capsule by mouth 2 (two) times daily.     pantoprazole (PROTONIX) 40 MG tablet Take 1 tablet (40 mg total) by mouth 2 (two) times daily before a meal. 180 tablet 1   potassium chloride (KLOR-CON) 10 MEQ tablet TAKE 1 TABLET BY MOUTH EVERY OTHER DAY WITH FUROSEMIDE 15 tablet 0   pravastatin (PRAVACHOL) 40 MG tablet TAKE 1 TABLET BY MOUTH DAILY 90 tablet 0   predniSONE (DELTASONE) 10 MG  tablet TAKE 1 TABLET BY MOUTH DAILY WITH BREAKFAST 30 tablet 10   PROLIA 60 MG/ML SOSY injection Inject 60 mg into the skin every 6 (six) months.     sertraline (ZOLOFT) 100 MG tablet Take 1 tablet (100 mg total) by mouth daily. 90 tablet 1   traMADol (ULTRAM) 50 MG tablet Take 1 tablet (50 mg total) by mouth every 8 (eight) hours as needed for severe pain. For chronic back pain 90 tablet 0   No current facility-administered medications on file prior to visit.    Past Medical History:  Diagnosis Date   Adenomatous colon polyp    Arthritis    CAD (coronary artery disease)    PTCA of RCA    Carotid artery occlusion    Cervical spine fracture (HCC)    CHF (congestive heart failure) (HCC)    COPD (chronic obstructive pulmonary disease) ([place])    Depression    Diverticulosis    Gastritis 09/15/1991   GERD (gastroesophageal reflux disease) 09/15/1991   Dr Sharlett Iles   Hiatal hernia 09/15/1991   Hip fracture,  left, closed, initial encounter ([place]) 07/16/2020   Hip fracture, right ([place])    HLD (hyperlipidemia)    HTN (hypertension)    Hyperplastic polyps of stomach 11/2007   colonoscopy   Hypertension    Hypothyroidism    affecting the left eye, proptosis   Iron deficiency anemia    Lung cancer ([place])    s/p XRT   Macular degeneration of left eye    Memory loss    Mesenteric artery stenosis ([place])    Myocardial infarction [facility]) 1993   Ocular myasthenia gravis ([place])    Dr Jannifer Franklin   Orthostatic hypotension 06/05/2013   PONV (postoperative nausea and vomiting)    Presence of permanent cardiac pacemaker 08/2019   Strabismus    left eye   Syncope 1998    Past Surgical History:  Procedure Laterality Date   ABDOMINAL AORTAGRAM N/A 10/15/2012   Procedure: ABDOMINAL Maxcine Ham;  Surgeon: Serafina [place], MD;  Location: [facility] CATH LAB;  Service: Cardiovascular;  Laterality: N/A;   arm surgery Left    fx   BALLOON ANGIOPLASTY, [place]   LAD 20/50, CFX OK, RCA  30 at prev PTCA site, EF with mild HK inferior wall   CAROTID ANGIOGRAM N/A 10/28/2014   Procedure: CAROTID ANGIOGRAM;  Surgeon: Serafina [place], MD;  Location: [facility] CATH LAB;  Service: Cardiovascular;  Laterality: N/A;   CAROTID ENDARTERECTOMY     CATARACT EXTRACTION     bilateral   COLONOSCOPY W/ POLYPECTOMY  2006   Adenomatous polyps   ENDARTERECTOMY Left 01/14/2015   Procedure: LEFT CAROTID ENDARTERECTOMY ;  Surgeon: Serafina [place], MD;  Location: [place];  Service: Vascular;  Laterality: Left;   ENDARTERECTOMY Right 08/12/2015   Procedure: ENDARTERECTOMY CAROTID WITH PATCH ANGIOPLASTY;  Surgeon: Serafina [place], MD;  Location: [place];  Service: Vascular;  Laterality: Right;   EYE MUSCLE SURGERY Left 11/04/2015   EYE SURGERY Bilateral May 2016   Eyelids   FOOT SURGERY Left    LEG SURGERY Left    laceration   MIDDLE EAR SURGERY Left 1970   PACEMAKER IMPLANT N/A 08/21/2019   Procedure: PACEMAKER IMPLANT;  Surgeon: [name], MD;  Location: [place] CV LAB;  Service: Cardiovascular;  Laterality: N/A;   PERCUTANEOUS STENT INTERVENTION  12/03/2012   Procedure: PERCUTANEOUS STENT INTERVENTION;  Surgeon: Serafina [place], MD;  Location: [facility] CATH LAB;  Service: Cardiovascular;;  sma stent x1   PERIPHERAL VASCULAR BALLOON ANGIOPLASTY  07/22/2019   Procedure: PERIPHERAL VASCULAR BALLOON ANGIOPLASTY;  Surgeon: Serafina [place], MD;  Location: [place] CV LAB;  Service: Cardiovascular;;  Superior mesenteric   STRABISMUS SURGERY Left 10/28/2015   Procedure: REPAIR STRABISMUS LEFT EYE;  Surgeon: Lamonte Sakai, MD;  Location: [place];  Service: Ophthalmology;  Laterality: Left;   Third-degree Henrine Hayter  2003   [facility]-legs ,buttocks,arms   TOTAL ABDOMINAL HYSTERECTOMY  1973   Dysfunctional menses   TOTAL HIP ARTHROPLASTY Left 07/17/2020   Procedure: HEMI HIP ARTHROPLASTY ANTERIOR APPROACH;  Surgeon: Leandrew Koyanagi, MD;  Location: [place];  Service: Orthopedics;  Laterality: Left;   UPPER GI ENDOSCOPY       Dr Joaquim Lai ANGIOGRAM N/A 10/15/2012   Procedure: VISCERAL ANGIOGRAM;  Surgeon: Serafina [place], MD;  Location[facility] CATH LAB;  Service: Cardiovascular;  Laterality: N/A;   VISCERAL ANGIOGRAM N/A 12/03/2012   Procedure: VISCERAL ANGIOGRAM;  Surgeon: Serafina [place], MD;  Location: [facility] CATH LAB;  Service: Cardiovascular;  Laterality: N/A;   VISCERAL ANGIOGRAM N/A 08/05/2013   Procedure: MESENTERIC ANGIOGRAM;  Surgeon: Serafina [place], MD;  Location: [facility] CATH LAB;  Service: Cardiovascular;  Laterality: N/A;   VISCERAL ANGIOGRAM N/A 10/28/2014   Procedure: VISCERAL ANGIOGRAM;  Surgeon: Serafina [place], MD;  Location: [facility] CATH LAB;  Service: Cardiovascular;  Laterality: N/A;   VISCERAL ANGIOGRAPHY N/A 07/22/2019   Procedure: MESENTERIC ANGIOGRAPHY;  Surgeon: Serafina [place], MD;  Location: [place] CV LAB;  Service: Cardiovascular;  Laterality: N/A;    Social History   Socioeconomic History   Marital status: Married    Spouse name: Not on file   Number of children: 3   Years of education: 9th   Highest education level: Not on file  Occupational History   Occupation: Retired  Tobacco Use   Smoking status: Former    Packs/day: 1.00    Years: 60.00    Pack years: 60.00    Types: Cigarettes    Quit date: 07/2019    Years since quitting: 1.7   Smokeless tobacco: Never   Tobacco comments:    Successfully quit  Vaping Use   Vaping Use: Never used  Substance and Sexual Activity   Alcohol use: No    Alcohol/week: 0.0 standard drinks   Drug use: No   Sexual activity: Not Currently  Other Topics Concern   Not on file  Social History Narrative   Patient is right handed.  Lives in [place] with husband.   Patient drinks 3-4 cups of caffeine daily.   Social Determinants of Health   Financial Resource Strain: Low Risk    Difficulty of Paying Living Expenses: Not hard at all  Food Insecurity: No Food Insecurity   Worried About Charity fundraiser in the Last Year: Never true    [place] in the Last Year: Never true  Transportation Needs: No Transportation Needs   Lack of Transportation (Medical): No   Lack of Transportation (Non-Medical): No  Physical Activity: Inactive   Days of Exercise per Week: 0 days   Minutes of Exercise per Session: 0 min  Stress: No Stress Concern Present   Feeling of Stress : Not at all  Social Connections: Not on file    Family History  Problem Relation Age of Onset   Throat cancer Mother        ? thyroid cancer   Cancer Mother    Emphysema Father    Diabetes Father    Heart attack Father 79   Colon cancer Brother    Cerebral aneurysm Brother    Hypothyroidism Sister        X2   Cancer Brother        Ear   Diabetes Paternal Grandmother    Diabetes Paternal Grandfather    Diabetes Maternal Aunt     Review of Systems  Constitutional:  Positive for appetite change (decreased). Negative for fever.  Respiratory:  Positive for cough, shortness of breath and wheezing.   Genitourinary:  Positive for frequency and urgency. Negative for difficulty urinating, dysuria and hematuria.      Objective:   Vitals:   04/28/21 0940  BP: (!) 144/72  Pulse: 74  Temp: 98.4 F (36.9 C)  SpO2: 98%   BP Readings from Last 3 Encounters:  04/28/21 (!) 144/72  04/04/21 (!) 141/75  02/14/21 126/60   Wt Readings from Last 3 Encounters:  04/28/21 136 lb (61.7 kg)  04/04/21 134 lb (60.8 kg)  02/14/21 133 lb (60.3 kg)   Body mass index is 22.63 kg/m.   Physical Exam    Constitutional: Appears well-developed and well-nourished. No distress.  Head: Normocephalic and atraumatic.  Neck: Neck supple. No tracheal deviation present. No thyromegaly present.  No cervical lymphadenopathy Cardiovascular: Normal rate, regular rhythm and normal heart sounds.  No murmur heard.   No edema Pulmonary/Chest: Effort normal and breath sounds normal. No respiratory distress. No has no wheezes. No rales.  Skin: Skin is warm and dry. Not  diaphoretic.  Psychiatric: Normal mood and affect. Behavior is normal.       Assessment & Plan:    See Problem List for Assessment and Plan of chronic medical problems.

## 2021-04-28 ENCOUNTER — Telehealth: Payer: Self-pay | Admitting: Orthopaedic Surgery

## 2021-04-28 ENCOUNTER — Encounter: Payer: Self-pay | Admitting: Internal Medicine

## 2021-04-28 ENCOUNTER — Ambulatory Visit (INDEPENDENT_AMBULATORY_CARE_PROVIDER_SITE_OTHER): Payer: PPO | Admitting: Internal Medicine

## 2021-04-28 ENCOUNTER — Telehealth: Payer: Self-pay | Admitting: Internal Medicine

## 2021-04-28 ENCOUNTER — Other Ambulatory Visit: Payer: Self-pay

## 2021-04-28 VITALS — BP 144/72 | HR 74 | Temp 98.4°F | Ht 65.0 in | Wt 136.0 lb

## 2021-04-28 DIAGNOSIS — N3281 Overactive bladder: Secondary | ICD-10-CM

## 2021-04-28 DIAGNOSIS — M810 Age-related osteoporosis without current pathological fracture: Secondary | ICD-10-CM

## 2021-04-28 DIAGNOSIS — Z23 Encounter for immunization: Secondary | ICD-10-CM

## 2021-04-28 MED ORDER — MIRABEGRON ER 25 MG PO TB24: 25.0000 mg | ORAL_TABLET | Freq: Every day | ORAL | 5 refills | Status: AC

## 2021-04-28 MED ORDER — MIRABEGRON ER 25 MG PO TB24: 25.0000 mg | ORAL_TABLET | Freq: Every day | ORAL | Status: DC

## 2021-04-28 NOTE — Telephone Encounter (Signed)
error 

## 2021-04-28 NOTE — Telephone Encounter (Signed)
yes

## 2021-04-28 NOTE — Patient Instructions (Addendum)
  Flu immunization administered today.      Medications changes include :   myrbetriq 25 mg daily  Your prescription(s) have been submitted to your pharmacy. Please take as directed and contact our office if you believe you are having problem(s) with the medication(s).   We will look into reclast

## 2021-04-28 NOTE — Telephone Encounter (Signed)
Patient's daughter called. Patient would like a gel injection in her knee. CB#[phone]

## 2021-04-28 NOTE — Assessment & Plan Note (Signed)
Chronic prolia is no longer affordable due to copay Discussed other options She is interested in reclast - will look into cost and let her know if covered or not.

## 2021-04-28 NOTE — Assessment & Plan Note (Signed)
New problem She has daytime frequency, urgency that is affecting her quality of life Discussed options Will try myrbetriq 25 mg daily - can increase to 50 mg if tolerated and effective Discussed referral to uro-gyn

## 2021-04-29 ENCOUNTER — Other Ambulatory Visit: Payer: Self-pay

## 2021-04-29 NOTE — Addendum Note (Signed)
Addended by: Marcina Millard on: 04/29/2021 09:53 AM   Modules accepted: Orders

## 2021-04-29 NOTE — Telephone Encounter (Signed)
Noted  

## 2021-05-03 ENCOUNTER — Telehealth: Payer: PPO

## 2021-05-04 ENCOUNTER — Ambulatory Visit: Payer: PPO | Admitting: Adult Health

## 2021-05-04 ENCOUNTER — Emergency Department (HOSPITAL_BASED_OUTPATIENT_CLINIC_OR_DEPARTMENT_OTHER): Payer: PPO | Admitting: Radiology

## 2021-05-04 ENCOUNTER — Other Ambulatory Visit: Payer: Self-pay

## 2021-05-04 ENCOUNTER — Encounter (HOSPITAL_BASED_OUTPATIENT_CLINIC_OR_DEPARTMENT_OTHER): Payer: Self-pay | Admitting: Obstetrics and Gynecology

## 2021-05-04 DIAGNOSIS — Z87891 Personal history of nicotine dependence: Secondary | ICD-10-CM | POA: Insufficient documentation

## 2021-05-04 DIAGNOSIS — J449 Chronic obstructive pulmonary disease, unspecified: Secondary | ICD-10-CM | POA: Insufficient documentation

## 2021-05-04 DIAGNOSIS — Z96642 Presence of left artificial hip joint: Secondary | ICD-10-CM | POA: Diagnosis not present

## 2021-05-04 DIAGNOSIS — Z79899 Other long term (current) drug therapy: Secondary | ICD-10-CM | POA: Diagnosis not present

## 2021-05-04 DIAGNOSIS — Z20822 Contact with and (suspected) exposure to covid-19: Secondary | ICD-10-CM | POA: Insufficient documentation

## 2021-05-04 DIAGNOSIS — Z7901 Long term (current) use of anticoagulants: Secondary | ICD-10-CM | POA: Diagnosis not present

## 2021-05-04 DIAGNOSIS — I110 Hypertensive heart disease with heart failure: Secondary | ICD-10-CM | POA: Insufficient documentation

## 2021-05-04 DIAGNOSIS — J441 Chronic obstructive pulmonary disease with (acute) exacerbation: Secondary | ICD-10-CM | POA: Diagnosis not present

## 2021-05-04 DIAGNOSIS — R531 Weakness: Secondary | ICD-10-CM | POA: Diagnosis not present

## 2021-05-04 DIAGNOSIS — I5031 Acute diastolic (congestive) heart failure: Secondary | ICD-10-CM | POA: Insufficient documentation

## 2021-05-04 DIAGNOSIS — I2510 Atherosclerotic heart disease of native coronary artery without angina pectoris: Secondary | ICD-10-CM | POA: Diagnosis not present

## 2021-05-04 DIAGNOSIS — R0602 Shortness of breath: Secondary | ICD-10-CM | POA: Diagnosis not present

## 2021-05-04 DIAGNOSIS — J90 Pleural effusion, not elsewhere classified: Secondary | ICD-10-CM | POA: Diagnosis not present

## 2021-05-04 DIAGNOSIS — Z85118 Personal history of other malignant neoplasm of bronchus and lung: Secondary | ICD-10-CM | POA: Insufficient documentation

## 2021-05-04 DIAGNOSIS — Z950 Presence of cardiac pacemaker: Secondary | ICD-10-CM | POA: Diagnosis not present

## 2021-05-04 DIAGNOSIS — E039 Hypothyroidism, unspecified: Secondary | ICD-10-CM | POA: Diagnosis not present

## 2021-05-04 DIAGNOSIS — I517 Cardiomegaly: Secondary | ICD-10-CM | POA: Diagnosis not present

## 2021-05-04 NOTE — ED Triage Notes (Signed)
Patient reports to the ER for Shortness of breath. Patient has a hx of COPD and emphysema and lung cancer.  Endorses nausea Denies chest pain

## 2021-05-04 NOTE — ED Notes (Signed)
Patient reportedly got a bolus of IV fluids with home health today. Patient has a hx of CHF. Unknown amount of fluid given, but over a 45 minute timeframe per daughter

## 2021-05-05 ENCOUNTER — Emergency Department (HOSPITAL_BASED_OUTPATIENT_CLINIC_OR_DEPARTMENT_OTHER): Payer: PPO

## 2021-05-05 ENCOUNTER — Emergency Department (HOSPITAL_BASED_OUTPATIENT_CLINIC_OR_DEPARTMENT_OTHER)
Admission: EM | Admit: 2021-05-05 | Discharge: 2021-05-05 | Disposition: A | Discharge status: Home / Self Care | Payer: PPO | Attending: Emergency Medicine | Admitting: Emergency Medicine

## 2021-05-05 ENCOUNTER — Encounter (HOSPITAL_BASED_OUTPATIENT_CLINIC_OR_DEPARTMENT_OTHER): Payer: Self-pay | Admitting: Radiology

## 2021-05-05 DIAGNOSIS — R911 Solitary pulmonary nodule: Secondary | ICD-10-CM | POA: Diagnosis not present

## 2021-05-05 DIAGNOSIS — J439 Emphysema, unspecified: Secondary | ICD-10-CM | POA: Diagnosis not present

## 2021-05-05 DIAGNOSIS — I700 Atherosclerosis of aorta: Secondary | ICD-10-CM | POA: Diagnosis not present

## 2021-05-05 DIAGNOSIS — R0602 Shortness of breath: Secondary | ICD-10-CM

## 2021-05-05 LAB — CBC WITH DIFFERENTIAL/PLATELET
Abs Immature Granulocytes: 0.52 10*3/uL — ABNORMAL HIGH (ref 0.00–0.07)
Basophils Absolute: 0 10*3/uL (ref 0.0–0.1)
Basophils Relative: 0 %
Eosinophils Absolute: 0 10*3/uL (ref 0.0–0.5)
Eosinophils Relative: 0 %
HCT: 34.6 % — ABNORMAL LOW (ref 36.0–46.0)
Hemoglobin: 10.1 g/dL — ABNORMAL LOW (ref 12.0–15.0)
Immature Granulocytes: 2 %
Lymphocytes Relative: 2 %
Lymphs Abs: 0.5 10*3/uL — ABNORMAL LOW (ref 0.7–4.0)
MCH: 26.2 pg (ref 26.0–34.0)
MCHC: 29.2 g/dL — ABNORMAL LOW (ref 30.0–36.0)
MCV: 89.9 fL (ref 80.0–100.0)
Monocytes Absolute: 2.1 10*3/uL — ABNORMAL HIGH (ref 0.1–1.0)
Monocytes Relative: 8 %
Neutro Abs: 21.8 10*3/uL — ABNORMAL HIGH (ref 1.7–7.7)
Neutrophils Relative %: 88 %
Platelets: 175 10*3/uL (ref 150–400)
RBC: 3.85 MIL/uL — ABNORMAL LOW (ref 3.87–5.11)
RDW: 14.7 % (ref 11.5–15.5)
WBC: 24.9 10*3/uL — ABNORMAL HIGH (ref 4.0–10.5)
nRBC: 0 % (ref 0.0–0.2)

## 2021-05-05 LAB — COMPREHENSIVE METABOLIC PANEL
ALT: 10 U/L (ref 0–44)
AST: 16 U/L (ref 15–41)
Albumin: 3.2 g/dL — ABNORMAL LOW (ref 3.5–5.0)
Alkaline Phosphatase: 57 U/L (ref 38–126)
Anion gap: 8 (ref 5–15)
BUN: 17 mg/dL (ref 8–23)
CO2: 32 mmol/L (ref 22–32)
Calcium: 9 mg/dL (ref 8.9–10.3)
Chloride: 100 mmol/L (ref 98–111)
Creatinine, Ser: 0.66 mg/dL (ref 0.44–1.00)
GFR, Estimated: >60 mL/min (ref >60)
Glucose, Bld: 122 mg/dL — ABNORMAL HIGH (ref 70–99)
Potassium: 4 mmol/L (ref 3.5–5.1)
Sodium: 140 mmol/L (ref 135–145)
Total Bilirubin: 0.5 mg/dL (ref 0.3–1.2)
Total Protein: 5.8 g/dL — ABNORMAL LOW (ref 6.5–8.1)

## 2021-05-05 LAB — BRAIN NATRIURETIC PEPTIDE: B Natriuretic Peptide: 1308.2 pg/mL — ABNORMAL HIGH (ref 0.0–100.0)

## 2021-05-05 LAB — TROPONIN I (HIGH SENSITIVITY)
Troponin I (High Sensitivity): 43 ng/L — ABNORMAL HIGH (ref <18)
Troponin I (High Sensitivity): 41 ng/L — ABNORMAL HIGH (ref <18)

## 2021-05-05 LAB — RESP PANEL BY RT-PCR (FLU A&B, COVID) ARPGX2
Influenza A by PCR: NEGATIVE
Influenza B by PCR: NEGATIVE
SARS Coronavirus 2 by RT PCR: NEGATIVE

## 2021-05-05 MED ORDER — DOXYCYCLINE HYCLATE 100 MG PO CAPS: 100.0000 mg | ORAL_CAPSULE | Freq: Two times a day (BID) | ORAL | 0 refills | Status: DC

## 2021-05-05 MED ORDER — ACETAMINOPHEN 325 MG PO TABS
650.0000 mg | ORAL_TABLET | Freq: Once | ORAL | Status: AC
  Administered 2021-05-05: 650 mg via ORAL
  Filled 2021-05-05: qty 2

## 2021-05-05 MED ORDER — AEROCHAMBER PLUS FLO-VU MEDIUM MISC
1.0000 | Freq: Once | Status: AC
  Administered 2021-05-05: 1
  Filled 2021-05-05: qty 1

## 2021-05-05 MED ORDER — IOHEXOL 300 MG/ML  SOLN
100.0000 mL | Freq: Once | INTRAMUSCULAR | Status: AC | PRN
  Administered 2021-05-05: 75 mL via INTRAVENOUS

## 2021-05-05 MED ORDER — SODIUM CHLORIDE 0.9 % IV SOLN
100.0000 mg | Freq: Once | INTRAVENOUS | Status: AC
  Administered 2021-05-05: 100 mg via INTRAVENOUS
  Filled 2021-05-05: qty 100

## 2021-05-05 MED ORDER — FUROSEMIDE 40 MG PO TABS
40.0000 mg | ORAL_TABLET | Freq: Once | ORAL | Status: AC
  Administered 2021-05-05: 40 mg via ORAL
  Filled 2021-05-05: qty 1

## 2021-05-05 NOTE — ED Notes (Signed)
Pt and daughter at bedside informed of plan of care. Pt to to be discharged after completing IV ABX

## 2021-05-05 NOTE — ED Notes (Signed)
Discharge instructions discussed with pt and caregiver/daughter at bedside. Pt verbalized understanding with no questions at this time. Pt to go home with daughter

## 2021-05-05 NOTE — Discharge Instructions (Addendum)
Go ahead and utilize the antibiotic prescription you have. If for some reason it doesn't work out I have printed another for you.

## 2021-05-05 NOTE — ED Notes (Signed)
RT titrated pts O2 from [place] 4Lpm to 2 Lpm. Pt sat goal per pulmonology is >88%. Pt sats on [place] 2 Lpm maintained at 94%-95% at this time. Pt respiratory status is stable w/no distress noted at this time. RT will continue to monitor.

## 2021-05-05 NOTE — ED Notes (Signed)
Patient transported to CT 

## 2021-05-05 NOTE — ED Notes (Signed)
RT educated pt/family on new aero chamber w/MDI. Pt is elderly and has difficulty w/aero chamber w/out mask. RT educated pt on how to use aero chamber w/mask that was given to her to take home to help w/better admin of her medications for her COPD. Pt and family express understanding.
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
## 2021-05-05 NOTE — ED Notes (Signed)
Noted empty oxygen tank at time of discharge. Daughter at bedside to call for someone to bring oxygen tank. Per daughter they live about 25 min away.

## 2021-05-06 ENCOUNTER — Telehealth: Payer: Self-pay

## 2021-05-06 ENCOUNTER — Encounter: Payer: Self-pay | Admitting: Internal Medicine

## 2021-05-06 NOTE — Telephone Encounter (Signed)
Pt daughter called stating pt was dx with pneumonia yesterday and they were advised her CT results showed changes in her cancer status.   Pts daughter wants to know if they should be concerned and if the pt needs to be seen sooner than 2023?

## 2021-05-06 NOTE — Telephone Encounter (Signed)
Per Patient Message 04/28/21:  Reclast will not be covered by her insurance.  She would have to try and not tolerate fosamax in order for reclast to be covered.  Fosamax is a pill once a week.  It can increase heartburn.  See if she is willing to try this - if she has side effects we can switch to reclast.      Let me know if you have any questions or concerns.    Dr. Billey Gosling.    Pt archived in [email or web address].  Please advise if patient and/or provider wish to proceed with Prolia therpay.

## 2021-05-10 ENCOUNTER — Telehealth: Payer: Self-pay | Admitting: Internal Medicine

## 2021-05-10 ENCOUNTER — Telehealth: Payer: Self-pay | Admitting: Cardiovascular Disease

## 2021-05-10 ENCOUNTER — Other Ambulatory Visit: Payer: Self-pay | Admitting: Internal Medicine

## 2021-05-10 DIAGNOSIS — J441 Chronic obstructive pulmonary disease with (acute) exacerbation: Secondary | ICD-10-CM | POA: Diagnosis not present

## 2021-05-10 NOTE — Telephone Encounter (Signed)
I spoke with pts daughter and advised as indicated.

## 2021-05-10 NOTE — Telephone Encounter (Signed)
*  STAT* If patient is at the pharmacy, call can be transferred to refill team.   1. Which medications need to be refilled? (please list name of each medication and dose if known)  furosemide (LASIX) 40 MG tablet  2. Which pharmacy/location (including street and city if local pharmacy) is medication to be sent to? [facility] [place] [address]  3. Do they need a 30 day or 90 day supply?  90 day refill

## 2021-05-10 NOTE — Telephone Encounter (Signed)
Patient's daughter [name] is requesting a new rx for furosemide (LASIX) 40 MG tablet  Advised daughter Dr. Quay Burow is not the prescribing provider  Refill has been requested with [name] the prescribing provider  Daughter stated refill is taking too long   Please advise  Pharmacy  [facility], [address]

## 2021-05-10 NOTE — Telephone Encounter (Signed)
New message    *STAT* If patient is at the pharmacy, call can be transferred to refill team.   1. Which medications need to be refilled? (please list name of each medication and dose if known) lasix 40 mg  2. Which pharmacy/location (including street and city if local pharmacy) is medication to be sent to? [facility]   3. Do they need a 30 day or 90 day supply? 30 day  Pt daughter states that pt is out of this medication. She has appt scheduled 11.14.22 with Dr. Rayann Heman

## 2021-05-11 ENCOUNTER — Encounter: Payer: Self-pay | Admitting: Adult Health

## 2021-05-11 ENCOUNTER — Other Ambulatory Visit: Payer: Self-pay

## 2021-05-11 MED ORDER — FUROSEMIDE 40 MG PO TABS: 40.0000 mg | ORAL_TABLET | ORAL | 1 refills | Status: DC

## 2021-05-11 NOTE — Telephone Encounter (Signed)
Refill was sent in this morning by Dr. Jackalyn Lombard office.  Spoke with daughter to let her know it had been sent in.

## 2021-05-13 ENCOUNTER — Emergency Department (HOSPITAL_COMMUNITY)
Admission: EM | Admit: 2021-05-13 | Discharge: 2021-05-13 | Disposition: A | Discharge status: Home / Self Care | Payer: PPO | Attending: Emergency Medicine | Admitting: Emergency Medicine

## 2021-05-13 ENCOUNTER — Other Ambulatory Visit: Payer: Self-pay

## 2021-05-13 ENCOUNTER — Telehealth: Payer: Self-pay | Admitting: Internal Medicine

## 2021-05-13 DIAGNOSIS — J189 Pneumonia, unspecified organism: Secondary | ICD-10-CM | POA: Diagnosis not present

## 2021-05-13 DIAGNOSIS — Z5321 Procedure and treatment not carried out due to patient leaving prior to being seen by health care provider: Secondary | ICD-10-CM | POA: Diagnosis not present

## 2021-05-13 NOTE — ED Notes (Signed)
Pt family asked me if we an switch from her o2 to ours she told me she just recently just changed from 4L to 2L. I will also do vitals once getting her on our o2

## 2021-05-13 NOTE — Telephone Encounter (Signed)
Currently in the ED.

## 2021-05-13 NOTE — Telephone Encounter (Signed)
Pt. Daughter has called and states pt was diagnosed with pneumonia on 10.19.2022. Pt. Is not getting any better. Caller states pt. Has went to the ER 2x and EMS was called today. Hospital said there isn't anything they can help her with. Would like a call back because she thinks pt. Needs more antibiotics.    Please advise.    Callback #- [phone]

## 2021-05-13 NOTE — ED Notes (Signed)
Pt visitor stated to registration that they were going to leave. Pt witness leaving. Pt stated going home and calling EMS.

## 2021-05-16 DIAGNOSIS — E782 Mixed hyperlipidemia: Secondary | ICD-10-CM | POA: Diagnosis not present

## 2021-05-16 DIAGNOSIS — J439 Emphysema, unspecified: Secondary | ICD-10-CM | POA: Diagnosis not present

## 2021-05-16 DIAGNOSIS — F3289 Other specified depressive episodes: Secondary | ICD-10-CM | POA: Diagnosis not present

## 2021-05-16 DIAGNOSIS — M810 Age-related osteoporosis without current pathological fracture: Secondary | ICD-10-CM

## 2021-05-16 DIAGNOSIS — I5032 Chronic diastolic (congestive) heart failure: Secondary | ICD-10-CM | POA: Diagnosis not present

## 2021-05-16 DIAGNOSIS — I10 Essential (primary) hypertension: Secondary | ICD-10-CM

## 2021-05-16 NOTE — Telephone Encounter (Signed)
If she is doing okay on 300 mg twice a day and I would not make any changes

## 2021-05-18 ENCOUNTER — Encounter: Payer: Self-pay | Admitting: Cardiovascular Disease

## 2021-05-18 ENCOUNTER — Ambulatory Visit: Payer: PPO | Admitting: Cardiovascular Disease

## 2021-05-18 ENCOUNTER — Ambulatory Visit (INDEPENDENT_AMBULATORY_CARE_PROVIDER_SITE_OTHER): Payer: PPO

## 2021-05-18 ENCOUNTER — Ambulatory Visit: Payer: PPO | Admitting: Pulmonary Disease

## 2021-05-18 ENCOUNTER — Encounter: Payer: Self-pay | Admitting: Pulmonary Disease

## 2021-05-18 ENCOUNTER — Other Ambulatory Visit: Payer: Self-pay

## 2021-05-18 VITALS — BP 120/68 | HR 70 | Ht 65.0 in | Wt 134.0 lb

## 2021-05-18 VITALS — BP 136/74 | HR 82 | Ht 65.0 in | Wt 132.0 lb

## 2021-05-18 DIAGNOSIS — J441 Chronic obstructive pulmonary disease with (acute) exacerbation: Secondary | ICD-10-CM

## 2021-05-18 DIAGNOSIS — J449 Chronic obstructive pulmonary disease, unspecified: Secondary | ICD-10-CM | POA: Diagnosis not present

## 2021-05-18 DIAGNOSIS — K449 Diaphragmatic hernia without obstruction or gangrene: Secondary | ICD-10-CM | POA: Diagnosis not present

## 2021-05-18 DIAGNOSIS — Z7189 Other specified counseling: Secondary | ICD-10-CM

## 2021-05-18 DIAGNOSIS — R0602 Shortness of breath: Secondary | ICD-10-CM

## 2021-05-18 DIAGNOSIS — J90 Pleural effusion, not elsewhere classified: Secondary | ICD-10-CM | POA: Diagnosis not present

## 2021-05-18 DIAGNOSIS — Z950 Presence of cardiac pacemaker: Secondary | ICD-10-CM | POA: Diagnosis not present

## 2021-05-18 DIAGNOSIS — I480 Paroxysmal atrial fibrillation: Secondary | ICD-10-CM

## 2021-05-18 DIAGNOSIS — E782 Mixed hyperlipidemia: Secondary | ICD-10-CM | POA: Diagnosis not present

## 2021-05-18 DIAGNOSIS — F17200 Nicotine dependence, unspecified, uncomplicated: Secondary | ICD-10-CM

## 2021-05-18 DIAGNOSIS — I25119 Atherosclerotic heart disease of native coronary artery with unspecified angina pectoris: Secondary | ICD-10-CM

## 2021-05-18 DIAGNOSIS — I5031 Acute diastolic (congestive) heart failure: Secondary | ICD-10-CM | POA: Diagnosis not present

## 2021-05-18 MED ORDER — POTASSIUM CHLORIDE ER 10 MEQ PO TBCR: EXTENDED_RELEASE_TABLET | ORAL | 3 refills | Status: AC

## 2021-05-18 MED ORDER — FUROSEMIDE 40 MG PO TABS: 40.0000 mg | ORAL_TABLET | ORAL | 3 refills | Status: AC

## 2021-05-18 MED ORDER — AZITHROMYCIN 250 MG PO TABS: ORAL_TABLET | ORAL | 0 refills | Status: DC

## 2021-05-18 MED ORDER — PREDNISONE 20 MG PO TABS
ORAL_TABLET | ORAL | 0 refills | Status: DC
Start: 2021-05-18 — End: 2021-05-25

## 2021-05-18 NOTE — Patient Instructions (Signed)
Medication Instructions:   -Restart furosemide (lasix) 40mg  every other day.  -Restart potassium chloride (Klor-con) 23meq every other day with Lasix.  *If you need a refill on your cardiac medications before your next appointment, please call your pharmacy*   Lab Work: Your physician recommends that you return for lab work in: 7-10 days for [employer].  If you have labs (blood work) drawn today and your tests are completely normal, you will receive your results only by: [place] (if you have MyChart) OR A paper copy in the mail If you have any lab test that is abnormal or we need to change your treatment, we will call you to review the results.   Follow-Up: At [facility], you and your health needs are our priority.  As part of our continuing mission to provide you with exceptional heart care, we have created designated Provider Care Teams.  These Care Teams include your primary Cardiologist (physician) and Advanced Practice Providers (APPs -  Physician Assistants and Nurse Practitioners) who all work together to provide you with the care you need, when you need it.  We recommend signing up for the patient portal called "MyChart".  Sign up information is provided on this After Visit Summary.  MyChart is used to connect with patients for Virtual Visits (Telemedicine).  Patients are able to view lab/test results, encounter notes, upcoming appointments, etc.  Non-urgent messages can be sent to your provider as well.   To learn more about what you can do with MyChart, go to [email or web address].    Your next appointment:   3 month(s)  The format for your next appointment:   In Person  Provider:   You will see one of the following Advanced Practice Providers on your designated Care Team:   Sande Rives, PA-C Coletta Memos, FNP  Then, Quay Burow, MD will plan to see you again in 6 month(s).

## 2021-05-18 NOTE — Assessment & Plan Note (Signed)
History of hyperlipidemia on statin therapy with lipid profile performed 12/13/2017 revealing total cholesterol of 200, LDL of 83 and HDL of 92.

## 2021-05-18 NOTE — Assessment & Plan Note (Signed)
History of PAF maintaining sinus rhythm on Eliquis oral anticoagulation. 

## 2021-05-18 NOTE — Progress Notes (Signed)
Brandi Raymond    [id number]    Dec 04, 1933  Primary Care Physician:Burns, Claudina Lick, MD  Referring Physician: Binnie Rail, MD [place],  [place] [address]  Chief complaint: Follow-up for severe COPD  HPI: 87 year old with history of hypertension, coronary artery disease, recent diagnosis in March 2020 of stage Ia squamous cell cancer status post SBRT.  Referred here for evaluation of emphysema.  Severe COPD on PFTs.    Was hospitalized in June 2021 for COPD exacerbation, CAP.  Noted to have mild oropharyngeal dysphagia and was seen by speech therapy. In 2021 she multiple clinic visits for COPD exacerbations requiring rounds of antibiotics, prednisone Started on chronic prednisone of 10 mg in 2021 for recurrent exacerbations  She has been evaluated by [facility] for kyphoplasty of the spine but in the interim got admitted on 07/15/2020 after a fall and hip fracture s/p hemi arthroplasty of L hip, anterior approach, to repair L hip fx sustained in a fall at home on 07/17/20 She tolerated general anesthesia without any pulmonary complication  Pets: No pets Occupation: Used to work in Primary school teacher and and [employer] Exposures: No known exposures, no mold, hot tub, Jacuzzi Smoking history: 15-pack-year smoker.  Quit smoking in early 2021 Travel history: No significant travel history Relevant family history: No significant family history of lung disease  Interim history: Continues on bevespi, supplemental oxygen and prednisone of 10 mg  She had a ED visit on 10/20 for shortness of breath.  CT chest reviewed with lung nodules and bibasal infiltrates.  Treated with doxycycline and IV fluids.  Respiratory virus panel was negative for flu and COVID She continues to be dyspneic with wheezing, cough.  No fevers or chills  Outpatient Encounter Medications as of 05/18/2021  Medication Sig   acetaminophen (TYLENOL) 325 MG tablet Take 1-2 tablets (325-650 mg total) by  mouth every 6 (six) hours as needed for mild pain, moderate pain or fever. Total acetaminophen dose from all sources not to exceed 4 g/day.   apixaban (ELIQUIS) 5 MG TABS tablet Take 1 tablet (5 mg total) by mouth 2 (two) times daily.   Calcium-Magnesium-Vitamin D (CALCIUM 1200+D3 PO) Take 1 tablet by mouth at bedtime.    clonazePAM (KLONOPIN) 0.5 MG tablet Take 1 tablet (0.5 mg total) by mouth at bedtime.   cyanocobalamin 1000 MCG tablet Take 1 tablet (1,000 mcg total) by mouth daily.   docusate sodium (COLACE) 100 MG capsule Take 100 mg by mouth at bedtime.    donepezil (ARICEPT) 10 MG tablet TAKE 1 TABLET BY MOUTH EACH NIGHT AT BEDTIME   ferrous sulfate 325 (65 FE) MG tablet Take 1 tablet (325 mg total) by mouth daily with breakfast.   furosemide (LASIX) 40 MG tablet Take 1 tablet (40 mg total) by mouth every other day.   gabapentin (NEURONTIN) 300 MG capsule TAKE 1 CAPSULE BY MOUTH TWICE DAILY AT 2NIGHTLY   Glycopyrrolate-Formoterol (BEVESPI AEROSPHERE) 9-4.8 MCG/ACT AERO Inhale 2 puffs into the lungs 2 (two) times daily.   ipratropium (ATROVENT) 0.02 % nebulizer solution Take 2.5 mLs (0.5 mg total) by nebulization every 6 (six) hours as needed for wheezing or shortness of breath.   levothyroxine (SYNTHROID) 125 MCG tablet TAKE 1 TABLET BY MOUTH ONCE DAILY BEFOREBREAKFAST   metoprolol tartrate (LOPRESSOR) 25 MG tablet Take 1 tablet (25 mg total) by mouth daily.   mirabegron ER (MYRBETRIQ) 25 MG TB24 tablet Take 1 tablet (25 mg total) by  mouth daily.   Multiple Vitamins-Minerals (PRESERVISION AREDS 2 PO) Take 1 capsule by mouth 2 (two) times daily.   pantoprazole (PROTONIX) 40 MG tablet Take 1 tablet (40 mg total) by mouth 2 (two) times daily before a meal.   potassium chloride (KLOR-CON) 10 MEQ tablet TAKE 1 TABLET BY MOUTH EVERY OTHER DAY WITH FUROSEMIDE   pravastatin (PRAVACHOL) 40 MG tablet TAKE 1 TABLET BY MOUTH DAILY   predniSONE (DELTASONE) 10 MG tablet TAKE 1 TABLET BY MOUTH DAILY WITH  BREAKFAST   PROLIA 60 MG/ML SOSY injection Inject 60 mg into the skin every 6 (six) months.   sertraline (ZOLOFT) 100 MG tablet Take 1 tablet (100 mg total) by mouth daily.   traMADol (ULTRAM) 50 MG tablet Take 1 tablet (50 mg total) by mouth every 8 (eight) hours as needed for severe pain. For chronic back pain   No facility-administered encounter medications on file as of 05/18/2021.    Physical Exam: Blood pressure 136/74, pulse 82, height 5\' 5"  (1.651 m), weight 132 lb (59.9 kg), SpO2 98 %. Gen:      No acute distress HEENT:  EOMI, sclera anicteric Neck:     No masses; no thyromegaly Lungs:    Bibasal crackles, wheezing CV:         Regular rate and rhythm; no murmurs Abd:      + bowel sounds; soft, non-tender; no palpable masses, no distension Ext:    No edema; adequate peripheral perfusion Skin:      Warm and dry; no rash Neuro: alert and oriented x 3 Psych: normal mood and affect   Data Reviewed: Imaging: CT chest 09/03/2018- lobulated mass in the superior segment of the right lower lobe measuring 2 cm.  Atherosclerosis, emphysema.  PET scan 09/25/18-2 cm right lower lobe pulmonary nodule is hypermetabolic.  Scattered bilateral soft 8 mm ill-defined lung nodules with low uptake.  CT chest 05/21/2020-progressive right lower lobe consolidation, left lower lobe airspace disease with groundglass, stable pulmonary nodules.  CT chest 05/05/2021-bibasal infiltrates worse in the right lower lobe, scattered pulmonary nodules I have reviewed the images personally.  PFTs: 05/21/2019 FVC 1.81 [73%], FEV1 0.95 [51%], F/F 53, TLC 4.61 [91%], DLCO 7.34 [39%] Moderate-severe obstruction with severe diffusion defect   Labs: CT-guided biopsy 10/14/2018-Poorly differentiated squamous cell cancer  CBC 09/17/2018-WBC 11.3, eos 1%, absolute eosinophil count 113  Assessment:  Severe COPD, GOLD D History of multiple exacerbations with ED visit last month for pneumonia She was treated with  doxycycline and continues on prednisone at 10  Still dyspneic with wheezing and cough I will give her a Z-Pak and prednisone 40 mg a day for 5 days Continue inhalers, nebs and supplemental oxygen  She was referred to palliative care at last visit but I do not see a note in the chart.  Will need reassessment at return  Lung cancer status post radiation therapy Getting regular follow-up scans with oncology  Goals of care Discussed goals of care with patient and daughter They have confirmed that [place] is DNR Palliative care referral is pending.  Plan/Recommendations: - Bevespi, supplemental oxygen - Z-Pak, prednisone   Marshell Garfinkel MD [facility] 05/18/2021, 4:33 PM  CC: Binnie Rail, MD

## 2021-05-18 NOTE — Progress Notes (Signed)
05/18/2021 Brandi Raymond   01-Jul-1934  [id number]  Primary Physician Quay Burow, Claudina Lick, MD Primary Cardiologist: Lorretta Harp MD Brandi Raymond, Brandi Raymond  HPI:  Brandi Raymond is a 87 y.o.  thin appearing married Caucasian female mother 81, grandmother 3 grandchildren referred by Dr. Erlinda Hong for preoperative clearance before left carpal tunnel release surgery.  I last saw her in the office 09/09/2019.  She has seen Dr. Percival Spanish in the past.  She has a history of treated hypertension hyperlipidemia as well as ongoing tobacco abuse having smoked 65 years.  She has CAD and PAD status post inferior wall myocardial infarction 1993 with a negative Myoview stress test 12/10/2014.  She has had mesenteric stenting back problem as well as right carotid endarterectomy.  She does have chronic shortness of breath on inhalers but denies chest pain.     Since I saw her a year ago she has had reintervention on her mesenteric artery stent by Dr. Trula Slade 07/22/2019.  She is complained of increasing shortness of breath but denies chest pain.  Her EKG today shows to the 1 heart block.  She is scheduled for a contrast CT tomorrow ordered by Dr. Quay Burow.  Previous CT scan performed 05/09/2019 ordered by Dr. Earlie Server because of lung cancer showed extensive coronary calcification   Since I saw her 8 months ago she has been seen in the ER on multiple occasions for shortness of breath.  She was diagnosed with pneumonia and placed on antibiotics and steroids.  Her BNP was elevated at 1300.  She has been off her diuretics.  She has severe COPD and stopped smoking 2 years ago but is on oxygen 24/7.  She is a DNR.   Current Meds  Medication Sig   acetaminophen (TYLENOL) 325 MG tablet Take 1-2 tablets (325-650 mg total) by mouth every 6 (six) hours as needed for mild pain, moderate pain or fever. Total acetaminophen dose from all sources not to exceed 4 g/day.   apixaban (ELIQUIS) 5 MG TABS tablet Take 1 tablet (5 mg total) by  mouth 2 (two) times daily.   Calcium-Magnesium-Vitamin D (CALCIUM 1200+D3 PO) Take 1 tablet by mouth at bedtime.    clonazePAM (KLONOPIN) 0.5 MG tablet Take 1 tablet (0.5 mg total) by mouth at bedtime.   cyanocobalamin 1000 MCG tablet Take 1 tablet (1,000 mcg total) by mouth daily.   docusate sodium (COLACE) 100 MG capsule Take 100 mg by mouth at bedtime.    donepezil (ARICEPT) 10 MG tablet TAKE 1 TABLET BY MOUTH EACH NIGHT AT BEDTIME   ferrous sulfate 325 (65 FE) MG tablet Take 1 tablet (325 mg total) by mouth daily with breakfast.   gabapentin (NEURONTIN) 300 MG capsule TAKE 1 CAPSULE BY MOUTH TWICE DAILY AT 2NIGHTLY   Glycopyrrolate-Formoterol (BEVESPI AEROSPHERE) 9-4.8 MCG/ACT AERO Inhale 2 puffs into the lungs 2 (two) times daily.   ipratropium (ATROVENT) 0.02 % nebulizer solution Take 2.5 mLs (0.5 mg total) by nebulization every 6 (six) hours as needed for wheezing or shortness of breath.   levothyroxine (SYNTHROID) 125 MCG tablet TAKE 1 TABLET BY MOUTH ONCE DAILY BEFOREBREAKFAST   metoprolol tartrate (LOPRESSOR) 25 MG tablet Take 1 tablet (25 mg total) by mouth daily.   mirabegron ER (MYRBETRIQ) 25 MG TB24 tablet Take 1 tablet (25 mg total) by mouth daily.   Multiple Vitamins-Minerals (PRESERVISION AREDS 2 PO) Take 1 capsule by mouth 2 (two) times daily.   pantoprazole (PROTONIX) 40 MG tablet Take 1  tablet (40 mg total) by mouth 2 (two) times daily before a meal.   potassium chloride (KLOR-CON) 10 MEQ tablet TAKE 1 TABLET BY MOUTH EVERY OTHER DAY WITH FUROSEMIDE   pravastatin (PRAVACHOL) 40 MG tablet TAKE 1 TABLET BY MOUTH DAILY   predniSONE (DELTASONE) 10 MG tablet TAKE 1 TABLET BY MOUTH DAILY WITH BREAKFAST   PROLIA 60 MG/ML SOSY injection Inject 60 mg into the skin every 6 (six) months.   sertraline (ZOLOFT) 100 MG tablet Take 1 tablet (100 mg total) by mouth daily.   traMADol (ULTRAM) 50 MG tablet Take 1 tablet (50 mg total) by mouth every 8 (eight) hours as needed for severe pain. For  chronic back pain   [DISCONTINUED] furosemide (LASIX) 40 MG tablet Take 1 tablet (40 mg total) by mouth every other day.     Allergies  Allergen Reactions   Silver Sulfadiazine Other (See Comments)    lowers white blood count     Social History   Socioeconomic History   Marital status: Married    Spouse name: Not on file   Number of children: 3   Years of education: 9th   Highest education level: Not on file  Occupational History   Occupation: Retired  Tobacco Use   Smoking status: Former    Packs/day: 1.00    Years: 60.00    Pack years: 60.00    Types: Cigarettes    Quit date: 07/2019    Years since quitting: 1.8   Smokeless tobacco: Never   Tobacco comments:    Successfully quit  Vaping Use   Vaping Use: Never used  Substance and Sexual Activity   Alcohol use: No    Alcohol/week: 0.0 standard drinks   Drug use: No   Sexual activity: Not Currently  Other Topics Concern   Not on file  Social History Narrative   Patient is right handed.  Lives in [place] with husband.   Patient drinks 3-4 cups of caffeine daily.   Social Determinants of Health   Financial Resource Strain: Low Risk    Difficulty of Paying Living Expenses: Not hard at all  Food Insecurity: No Food Insecurity   Worried About Charity fundraiser in the Last Year: Never true   [place] in the Last Year: Never true  Transportation Needs: No Transportation Needs   Lack of Transportation (Medical): No   Lack of Transportation (Non-Medical): No  Physical Activity: Inactive   Days of Exercise per Week: 0 days   Minutes of Exercise per Session: 0 min  Stress: No Stress Concern Present   Feeling of Stress : Not at all  Social Connections: Not on file  Intimate Partner Violence: Not on file     Review of Systems: General: negative for chills, fever, night sweats or weight changes.  Cardiovascular: negative for chest pain, dyspnea on exertion, edema, orthopnea, palpitations, paroxysmal  nocturnal dyspnea or shortness of breath Dermatological: negative for rash Respiratory: negative for cough or wheezing Urologic: negative for hematuria Abdominal: negative for nausea, vomiting, diarrhea, bright red blood per rectum, melena, or hematemesis Neurologic: negative for visual changes, syncope, or dizziness All other systems reviewed and are otherwise negative except as noted above.    Blood pressure 120/68, pulse 70, height 5\' 5"  (1.651 m), weight 134 lb (60.8 kg).  General appearance: alert and no distress Neck: no adenopathy, no JVD, supple, symmetrical, trachea midline, thyroid not enlarged, symmetric, no tenderness/mass/nodules, and bilateral carotid bruits Lungs: Bilateral basilar crackles Heart:  regular rate and rhythm, S1, S2 normal, no murmur, click, rub or gallop Extremities: Acrocyanosis Pulses: Absent pedal pulses Skin: Skin color, texture, turgor normal. No rashes or lesions Neurologic: Grossly normal  EKG not performed today  ASSESSMENT AND PLAN:   HYPERLIPIDEMIA History of hyperlipidemia on statin therapy with lipid profile performed 12/13/2017 revealing total cholesterol of 200, LDL of 83 and HDL of 92.  CIGARETTE SMOKER Long history of tobacco abuse having quit 2 years ago with severe COPD, oxygen dependent.  Acute diastolic CHF (congestive heart failure) (HCC) History of chronic diastolic heart failure with dietary indiscretion with regards to this salt.  Her last echo performed 08/20/2019 revealed normal LV systolic function with moderate MR.  She has been off her diuretic now for a while.  She has bilateral basilar crackles on exam.  I am going to start her back on her furosemide 40 mg    Paroxysmal atrial fibrillation (HCC) History of PAF maintaining sinus rhythm on Eliquis oral anticoagulation.  Pacemaker History of pacemaker insertion by Dr. Rayann Heman 08/21/2019  Coronary artery disease History of CAD status post inferior wall myocardial infarction  1993 with a negative Myoview stress test 12/10/2014.  The patient does get occasional chest pain.     Lorretta Harp MD FACP,FACC,FAHA, [facility] 05/18/2021 10:34 AM

## 2021-05-18 NOTE — Assessment & Plan Note (Signed)
History of pacemaker insertion by Dr. Rayann Heman 08/21/2019

## 2021-05-18 NOTE — Patient Instructions (Signed)
We will get a chest x-ray today We will give a Z-Pak and prednisone 40 mg a day for 5 days Will make video visit in 1 week for follow-up with me

## 2021-05-18 NOTE — Assessment & Plan Note (Signed)
History of chronic diastolic heart failure with dietary indiscretion with regards to this salt.  Her last echo performed 08/20/2019 revealed normal LV systolic function with moderate MR.  She has been off her diuretic now for a while.  She has bilateral basilar crackles on exam.  I am going to start her back on her furosemide 40 mg

## 2021-05-18 NOTE — Assessment & Plan Note (Signed)
History of CAD status post inferior wall myocardial infarction 1993 with a negative Myoview stress test 12/10/2014.  The patient does get occasional chest pain.

## 2021-05-18 NOTE — Assessment & Plan Note (Signed)
Long history of tobacco abuse having quit 2 years ago with severe COPD, oxygen dependent.

## 2021-05-23 ENCOUNTER — Ambulatory Visit (INDEPENDENT_AMBULATORY_CARE_PROVIDER_SITE_OTHER): Payer: PPO

## 2021-05-23 ENCOUNTER — Telehealth: Payer: Self-pay | Admitting: Pharmacist

## 2021-05-23 DIAGNOSIS — I441 Atrioventricular block, second degree: Secondary | ICD-10-CM

## 2021-05-23 LAB — CUP PACEART REMOTE DEVICE CHECK
Battery Remaining Longevity: 110 mo
Battery Remaining Percentage: 87 %
Battery Voltage: 3.02 V
Brady Statistic AP VP Percent: 11 %
Brady Statistic AP VS Percent: 1.9 %
Brady Statistic AS VP Percent: 58 %
Brady Statistic AS VS Percent: 29 %
Brady Statistic RA Percent Paced: 13 %
Brady Statistic RV Percent Paced: 68 %
Date Time Interrogation Session: 20221107010014
Implantable Lead Implant Date: 20210204
Implantable Lead Implant Date: 20210204
Implantable Lead Location: 753859
Implantable Lead Location: 753860
Implantable Pulse Generator Implant Date: 20210204
Lead Channel Impedance Value: 360 Ohm
Lead Channel Impedance Value: 530 Ohm
Lead Channel Pacing Threshold Amplitude: 0.625 V
Lead Channel Pacing Threshold Amplitude: 0.625 V
Lead Channel Pacing Threshold Pulse Width: 0.4 ms
Lead Channel Pacing Threshold Pulse Width: 0.5 ms
Lead Channel Sensing Intrinsic Amplitude: 12 mV
Lead Channel Sensing Intrinsic Amplitude: 4.2 mV
Lead Channel Setting Pacing Amplitude: 0.875
Lead Channel Setting Pacing Amplitude: 1.625
Lead Channel Setting Pacing Pulse Width: 0.4 ms
Lead Channel Setting Sensing Sensitivity: 2.5 mV
Pulse Gen Model: 2272
Pulse Gen Serial Number: 9196859

## 2021-05-23 NOTE — Telephone Encounter (Signed)
Patient daughter brought in Patient assistance form to be completed. Ask if she can be called when the forms have been completed. Levi Aland [phone].

## 2021-05-23 NOTE — Telephone Encounter (Signed)
Received Eliquis patient assistance renewal forms for 2023 from patient. Unfortunately this program requires 3% out-of-pocket spend on prescriptions in 2023 before pt will qualify again, so we will need to wait until next year to reapply (likely a few months into the year since they start counting prescription costs as of Jul 17 2021).

## 2021-05-23 NOTE — Progress Notes (Signed)
Called and spoke with daughter [name] about the renewal process for Eliquis, she acknowledged understanding.  Orinda Kenner, [facility] Pharmacists Assistant [phone]

## 2021-05-24 ENCOUNTER — Ambulatory Visit: Payer: PPO | Admitting: Pulmonary Disease

## 2021-05-25 ENCOUNTER — Telehealth (INDEPENDENT_AMBULATORY_CARE_PROVIDER_SITE_OTHER): Payer: PPO | Admitting: Pulmonary Disease

## 2021-05-25 DIAGNOSIS — J449 Chronic obstructive pulmonary disease, unspecified: Secondary | ICD-10-CM | POA: Diagnosis not present

## 2021-05-25 NOTE — Progress Notes (Signed)
Brandi Raymond    [id number]    02/25/1934  Primary Care Physician:Burns, Claudina Lick, MD  Referring Physician: Binnie Rail, MD Raymond,  Brandi [address]  Virtual Visit via Video Note  I connected with Brandi Raymond on 05/28/21 at  4:15 PM EST by a video enabled telemedicine application and verified that I am speaking with the correct person using two identifiers.  Location: Patient: Home Provider: Pulmonary office, [place]   I discussed the limitations of evaluation and management by telemedicine and the availability of in person appointments. The patient expressed understanding and agreed to proceed.  Chief complaint: Follow-up for severe COPD  HPI: 87 year old with history of hypertension, coronary artery disease, recent diagnosis in March 2020 of stage Ia squamous cell cancer status post SBRT.  Referred here for evaluation of emphysema.  Severe COPD on PFTs.    Was hospitalized in June 2021 for COPD exacerbation, CAP.  Noted to have mild oropharyngeal dysphagia and was seen by speech therapy. In 2021 she multiple clinic visits for COPD exacerbations requiring rounds of antibiotics, prednisone Started on chronic prednisone of 10 mg in 2021 for recurrent exacerbations  She has been evaluated by [facility] for kyphoplasty of the spine but in the interim got admitted on 07/15/2020 after a fall and hip fracture s/p hemi arthroplasty of L hip, anterior approach, to repair L hip fx sustained in a fall at home on 07/17/20 She tolerated general anesthesia without any pulmonary complication  Pets: No pets Occupation: Used to work in Primary school teacher and and [employer] Exposures: No known exposures, no mold, hot tub, Jacuzzi Smoking history: 15-pack-year smoker.  Quit smoking in early 2021 Travel history: No significant travel history Relevant family history: No significant family history of lung disease  Interim history: Continues on bevespi,  supplemental oxygen and prednisone of 10 mg  She had a ED visit on 05/05/21 for shortness of breath.  CT chest reviewed with lung nodules and bibasal infiltrates.  Treated with doxycycline and IV fluids.  Respiratory virus panel was negative for flu and COVID She continues to be dyspneic with wheezing, cough.  No fevers or chills  At follow-up office visit she was given additional Z-Pak and prednisone  Today she is feeling better and much improved.  Continues to have some mild cough and dyspnea on exertion which is unchanged from baseline.  Outpatient Encounter Medications as of 05/25/2021  Medication Sig   acetaminophen (TYLENOL) 325 MG tablet Take 1-2 tablets (325-650 mg total) by mouth every 6 (six) hours as needed for mild pain, moderate pain or fever. Total acetaminophen dose from all sources not to exceed 4 g/day.   apixaban (ELIQUIS) 5 MG TABS tablet Take 1 tablet (5 mg total) by mouth 2 (two) times daily.   Calcium-Magnesium-Vitamin D (CALCIUM 1200+D3 PO) Take 1 tablet by mouth at bedtime.    clonazePAM (KLONOPIN) 0.5 MG tablet Take 1 tablet (0.5 mg total) by mouth at bedtime.   cyanocobalamin 1000 MCG tablet Take 1 tablet (1,000 mcg total) by mouth daily.   docusate sodium (COLACE) 100 MG capsule Take 100 mg by mouth at bedtime.    donepezil (ARICEPT) 10 MG tablet TAKE 1 TABLET BY MOUTH EACH NIGHT AT BEDTIME   ferrous sulfate 325 (65 FE) MG tablet Take 1 tablet (325 mg total) by mouth daily with breakfast.   furosemide (LASIX) 40 MG tablet Take 1 tablet (40 mg total) by mouth every other  day.   gabapentin (NEURONTIN) 300 MG capsule TAKE 1 CAPSULE BY MOUTH TWICE DAILY AT 2NIGHTLY   Glycopyrrolate-Formoterol (BEVESPI AEROSPHERE) 9-4.8 MCG/ACT AERO Inhale 2 puffs into the lungs 2 (two) times daily.   ipratropium (ATROVENT) 0.02 % nebulizer solution Take 2.5 mLs (0.5 mg total) by nebulization every 6 (six) hours as needed for wheezing or shortness of breath.   levothyroxine (SYNTHROID) 125  MCG tablet TAKE 1 TABLET BY MOUTH ONCE DAILY BEFOREBREAKFAST   metoprolol tartrate (LOPRESSOR) 25 MG tablet Take 1 tablet (25 mg total) by mouth daily.   mirabegron ER (MYRBETRIQ) 25 MG TB24 tablet Take 1 tablet (25 mg total) by mouth daily.   Multiple Vitamins-Minerals (PRESERVISION AREDS 2 PO) Take 1 capsule by mouth 2 (two) times daily.   pantoprazole (PROTONIX) 40 MG tablet Take 1 tablet (40 mg total) by mouth 2 (two) times daily before a meal.   potassium chloride (KLOR-CON) 10 MEQ tablet TAKE 1 TABLET BY MOUTH EVERY OTHER DAY WITH FUROSEMIDE   pravastatin (PRAVACHOL) 40 MG tablet TAKE 1 TABLET BY MOUTH DAILY   predniSONE (DELTASONE) 10 MG tablet TAKE 1 TABLET BY MOUTH DAILY WITH BREAKFAST   PROLIA 60 MG/ML SOSY injection Inject 60 mg into the skin every 6 (six) months.   sertraline (ZOLOFT) 100 MG tablet Take 1 tablet (100 mg total) by mouth daily.   traMADol (ULTRAM) 50 MG tablet Take 1 tablet (50 mg total) by mouth every 8 (eight) hours as needed for severe pain. For chronic back pain   [DISCONTINUED] azithromycin (ZITHROMAX) 250 MG tablet Take as directed   [DISCONTINUED] predniSONE (DELTASONE) 20 MG tablet Take 40mg  for 5 days   No facility-administered encounter medications on file as of 05/25/2021.    Physical Exam: There were no vitals taken for this visit. Appears comfortable on visit with no distress  Data Reviewed: Imaging: CT chest 09/03/2018- lobulated mass in the superior segment of the right lower lobe measuring 2 cm.  Atherosclerosis, emphysema.  PET scan 09/25/18-2 cm right lower lobe pulmonary nodule is hypermetabolic.  Scattered bilateral soft 8 mm ill-defined lung nodules with low uptake.  CT chest 05/21/2020-progressive right lower lobe consolidation, left lower lobe airspace disease with groundglass, stable pulmonary nodules.  CT chest 05/05/2021-bibasal infiltrates worse in the right lower lobe, scattered pulmonary nodules I have reviewed the images  personally.  PFTs: 05/21/2019 FVC 1.81 [73%], FEV1 0.95 [51%], F/F 53, TLC 4.61 [91%], DLCO 7.34 [39%] Moderate-severe obstruction with severe diffusion defect   Labs: CT-guided biopsy 10/14/2018-Poorly differentiated squamous cell cancer  CBC 09/17/2018-WBC 11.3, eos 1%, absolute eosinophil count 113  Assessment:  Severe COPD, GOLD D History of multiple exacerbations with ED visit last month for pneumonia S/p Z-Pak and prednisone from outpatient office Overall improved since last visit Continue inhalers, nebs and supplemental oxygen  She was referred to palliative care at last visit but I do not see a note in the chart.  Will need reassessment at return  Lung cancer status post radiation therapy Getting regular follow-up scans with oncology  Goals of care Discussed goals of care with patient and daughter They have confirmed that [place] is DNR Palliative care referral is pending.  Plan/Recommendations: - Bevespi, supplemental oxygen  I discussed the assessment and treatment plan with the patient. The patient was provided an opportunity to ask questions and all were answered. The patient agreed with the plan and demonstrated an understanding of the instructions.   The patient was advised to call back or seek an  in-person evaluation if the symptoms worsen or if the condition fails to improve as anticipated.  Marshell Garfinkel MD [facility] 05/25/2021, 4:01 PM  CC: Binnie Rail, MD

## 2021-05-26 ENCOUNTER — Telehealth (INDEPENDENT_AMBULATORY_CARE_PROVIDER_SITE_OTHER): Payer: PPO | Admitting: Adult Health

## 2021-05-26 ENCOUNTER — Telehealth: Payer: Self-pay | Admitting: Adult Health

## 2021-05-26 DIAGNOSIS — Z8701 Personal history of pneumonia (recurrent): Secondary | ICD-10-CM | POA: Diagnosis not present

## 2021-05-26 DIAGNOSIS — R918 Other nonspecific abnormal finding of lung field: Secondary | ICD-10-CM

## 2021-05-26 DIAGNOSIS — Z09 Encounter for follow-up examination after completed treatment for conditions other than malignant neoplasm: Secondary | ICD-10-CM | POA: Diagnosis not present

## 2021-05-26 DIAGNOSIS — G609 Hereditary and idiopathic neuropathy, unspecified: Secondary | ICD-10-CM | POA: Diagnosis not present

## 2021-05-26 DIAGNOSIS — G7000 Myasthenia gravis without (acute) exacerbation: Secondary | ICD-10-CM

## 2021-05-26 DIAGNOSIS — R413 Other amnesia: Secondary | ICD-10-CM

## 2021-05-26 NOTE — Telephone Encounter (Signed)
..   Pt understands that although there may be some limitations with this type of visit, we will take all precautions to reduce any security or privacy concerns.  Pt understands that this will be treated like an in office visit and we will file with pt's insurance, and there may be a patient responsible charge related to this service. ? ?

## 2021-05-26 NOTE — Progress Notes (Signed)
  [facility] [address] [place]. [place] [address] [phone]  PRIMARY NEUROLOGIST:    Virtual Visit via Telephone Note  I connected with Brandi Raymond on 05/26/21 at 10:30 AM EST by telephone located remotely at [facility] and verified that I am speaking with the correct person using two identifiers who reports being located at home.    Visit scheduled by me. She discussed the limitations, risks, security and privacy concerns of performing an evaluation and management service by telephone and the availability of in person appointments. I also discussed with the patient that there may be a patient responsible charge related to this service. The patient expressed understanding and agreed to proceed. See telephone note for consent and additional scheduling information.    History of Present Illness:  Brandi Raymond is a 87 y.o. female who has been followed in this office for memory disturbance and peripheral neuropathy.  She was unable to get her camera working so she was transition to a telephone visit.  Her daughter is with her.  Reports that the patient had pneumonia and was very sick for a while.  She states that they started noticing that when the patient would wake up in the morning she had slurred speech and was confused.  Discontinue the nighttime dose of gabapentin and the symptoms improved.  The patient still has some discomfort related to her neuropathy.  But she feels that it is manageable at this time.  She currently takes gabapentin 300 twice a day.  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 Follows all commands speech and language fluent  Assessment and Plan:  1: Peripheral neuropathy  Continue  gabapentin 300 mg twice a day.  We did discuss potentially increasing this in the future if needed  2.  Memory disturbance  Continue Aricept 10 mg daily  3.  Ocular myasthenia gravis  Stable not currently on any medication   Follow Up Instructions:   F/U in 6 months or sooner if needed    I discussed the assessment and treatment plan with the patient.  The patient was provided an opportunity to ask questions and all were answered to their satisfaction. The patient agreed with the plan and verbalized an understanding of the instructions.   I provided 19 minutes of non-face-to-face time during this encounter.    Ward Givens NP-C  [facility] [address] [place], [place] [address]  Phone [phone] Fax [phone] \

## 2021-05-26 NOTE — Telephone Encounter (Signed)
noted 

## 2021-05-27 NOTE — Progress Notes (Signed)
Remote pacemaker transmission.   

## 2021-05-30 ENCOUNTER — Telehealth (INDEPENDENT_AMBULATORY_CARE_PROVIDER_SITE_OTHER): Payer: PPO | Admitting: Internal Medicine

## 2021-05-30 ENCOUNTER — Encounter: Payer: Self-pay | Admitting: Internal Medicine

## 2021-05-30 VITALS — Ht 65.0 in | Wt 133.0 lb

## 2021-05-30 DIAGNOSIS — I441 Atrioventricular block, second degree: Secondary | ICD-10-CM | POA: Diagnosis not present

## 2021-05-30 DIAGNOSIS — I480 Paroxysmal atrial fibrillation: Secondary | ICD-10-CM | POA: Diagnosis not present

## 2021-05-30 DIAGNOSIS — I5032 Chronic diastolic (congestive) heart failure: Secondary | ICD-10-CM | POA: Diagnosis not present

## 2021-05-30 DIAGNOSIS — I10 Essential (primary) hypertension: Secondary | ICD-10-CM

## 2021-05-30 DIAGNOSIS — I110 Hypertensive heart disease with heart failure: Secondary | ICD-10-CM | POA: Diagnosis not present

## 2021-05-30 DIAGNOSIS — I442 Atrioventricular block, complete: Secondary | ICD-10-CM
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
## 2021-06-01 ENCOUNTER — Telehealth: Payer: Self-pay

## 2021-06-01 ENCOUNTER — Encounter: Payer: Self-pay | Admitting: Orthopaedic Surgery

## 2021-06-01 NOTE — Telephone Encounter (Signed)
VOB submitted for Monovisc, right knee. Pending BV.

## 2021-06-02 ENCOUNTER — Telehealth: Payer: Self-pay

## 2021-06-02 ENCOUNTER — Ambulatory Visit: Payer: PPO | Admitting: Adult Health

## 2021-06-02 NOTE — Telephone Encounter (Signed)
Approved for Monovisc, right knee. [place] Patient will be responsible for 20% OOP. Co-pay of $30.00 No PA required

## 2021-06-02 NOTE — Telephone Encounter (Signed)
Tried calling [name] to schedule for patient's gel injection for right knee, but no answer and was not able to leave a message due to VM being full.

## 2021-06-07 ENCOUNTER — Ambulatory Visit: Payer: PPO | Admitting: Orthopaedic Surgery

## 2021-06-10 DIAGNOSIS — J441 Chronic obstructive pulmonary disease with (acute) exacerbation: Secondary | ICD-10-CM | POA: Diagnosis not present

## 2021-06-11 ENCOUNTER — Encounter: Payer: Self-pay | Admitting: Internal Medicine

## 2021-06-12 NOTE — Telephone Encounter (Signed)
PM please advise. Thanks

## 2021-06-13 NOTE — Telephone Encounter (Signed)
I tried calling but it went to voice mail The chest x ray showed the pneumonia is better and shows chronic changes of COPD. We cannot make out the tiny lung nodules on chest x ray We do need a follow up CT in 2 months  Triage- Please order follow up CT without contrast in 2 months

## 2021-06-14 ENCOUNTER — Encounter (HOSPITAL_COMMUNITY): Payer: Self-pay

## 2021-06-14 ENCOUNTER — Ambulatory Visit: Payer: PPO | Admitting: Orthopaedic Surgery

## 2021-06-14 ENCOUNTER — Emergency Department (HOSPITAL_COMMUNITY)
Admission: EM | Admit: 2021-06-14 | Discharge: 2021-06-16 | Disposition: E | Payer: PPO | Attending: Emergency Medicine | Admitting: Emergency Medicine

## 2021-06-14 ENCOUNTER — Ambulatory Visit (INDEPENDENT_AMBULATORY_CARE_PROVIDER_SITE_OTHER): Payer: PPO

## 2021-06-14 ENCOUNTER — Encounter: Payer: Self-pay | Admitting: Orthopaedic Surgery

## 2021-06-14 ENCOUNTER — Other Ambulatory Visit: Payer: Self-pay

## 2021-06-14 DIAGNOSIS — M546 Pain in thoracic spine: Secondary | ICD-10-CM | POA: Diagnosis not present

## 2021-06-14 DIAGNOSIS — Z85118 Personal history of other malignant neoplasm of bronchus and lung: Secondary | ICD-10-CM | POA: Diagnosis not present

## 2021-06-14 DIAGNOSIS — R062 Wheezing: Secondary | ICD-10-CM | POA: Diagnosis not present

## 2021-06-14 DIAGNOSIS — J441 Chronic obstructive pulmonary disease with (acute) exacerbation: Secondary | ICD-10-CM | POA: Diagnosis not present

## 2021-06-14 DIAGNOSIS — R1111 Vomiting without nausea: Secondary | ICD-10-CM | POA: Diagnosis not present

## 2021-06-14 DIAGNOSIS — M1711 Unilateral primary osteoarthritis, right knee: Secondary | ICD-10-CM | POA: Diagnosis not present

## 2021-06-14 DIAGNOSIS — Z87891 Personal history of nicotine dependence: Secondary | ICD-10-CM | POA: Diagnosis not present

## 2021-06-14 DIAGNOSIS — E8729 Other acidosis: Secondary | ICD-10-CM | POA: Diagnosis not present

## 2021-06-14 DIAGNOSIS — G8929 Other chronic pain: Secondary | ICD-10-CM | POA: Diagnosis not present

## 2021-06-14 DIAGNOSIS — E039 Hypothyroidism, unspecified: Secondary | ICD-10-CM | POA: Diagnosis not present

## 2021-06-14 DIAGNOSIS — I110 Hypertensive heart disease with heart failure: Secondary | ICD-10-CM | POA: Insufficient documentation

## 2021-06-14 DIAGNOSIS — R0603 Acute respiratory distress: Secondary | ICD-10-CM | POA: Diagnosis not present

## 2021-06-14 DIAGNOSIS — I5031 Acute diastolic (congestive) heart failure: Secondary | ICD-10-CM | POA: Insufficient documentation

## 2021-06-14 DIAGNOSIS — Z20822 Contact with and (suspected) exposure to covid-19: Secondary | ICD-10-CM | POA: Diagnosis not present

## 2021-06-14 DIAGNOSIS — Z7951 Long term (current) use of inhaled steroids: Secondary | ICD-10-CM | POA: Insufficient documentation

## 2021-06-14 DIAGNOSIS — I469 Cardiac arrest, cause unspecified: Secondary | ICD-10-CM | POA: Insufficient documentation

## 2021-06-14 DIAGNOSIS — R1110 Vomiting, unspecified: Secondary | ICD-10-CM | POA: Insufficient documentation

## 2021-06-14 DIAGNOSIS — Z950 Presence of cardiac pacemaker: Secondary | ICD-10-CM | POA: Diagnosis not present

## 2021-06-14 DIAGNOSIS — R000 Tachycardia, unspecified: Secondary | ICD-10-CM | POA: Diagnosis not present

## 2021-06-14 DIAGNOSIS — R0902 Hypoxemia: Secondary | ICD-10-CM | POA: Diagnosis not present

## 2021-06-14 DIAGNOSIS — I2510 Atherosclerotic heart disease of native coronary artery without angina pectoris: Secondary | ICD-10-CM | POA: Insufficient documentation

## 2021-06-14 DIAGNOSIS — Z5321 Procedure and treatment not carried out due to patient leaving prior to being seen by health care provider: Secondary | ICD-10-CM | POA: Insufficient documentation

## 2021-06-14 MED ORDER — HYALURONAN 88 MG/4ML IX SOSY
88.0000 mg | PREFILLED_SYRINGE | INTRA_ARTICULAR | Status: AC | PRN
  Administered 2021-06-14: 88 mg via INTRA_ARTICULAR

## 2021-06-14 MED ORDER — LIDOCAINE HCL 1 % IJ SOLN
2.0000 mL | INTRAMUSCULAR | Status: AC | PRN
  Administered 2021-06-14: 2 mL

## 2021-06-14 MED ORDER — BUPIVACAINE HCL 0.25 % IJ SOLN
2.0000 mL | INTRAMUSCULAR | Status: AC | PRN
  Administered 2021-06-14: 2 mL via INTRA_ARTICULAR

## 2021-06-14 NOTE — ED Triage Notes (Signed)
Pt BIB Ems from home with vomiting x 6 hrs. Pt is on home oxygen 6 L. Pt is normally low in saturation levels (low to mid 80 s)  8 mg Zofran given via E[facility]
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
## 2021-06-15 ENCOUNTER — Emergency Department (HOSPITAL_COMMUNITY)
Admission: EM | Admit: 2021-06-15 | Discharge: 2021-06-16 | Disposition: E | Payer: PPO | Source: Home / Self Care | Attending: Emergency Medicine | Admitting: Emergency Medicine

## 2021-06-15 ENCOUNTER — Emergency Department (HOSPITAL_COMMUNITY): Payer: PPO

## 2021-06-15 DIAGNOSIS — I5031 Acute diastolic (congestive) heart failure: Secondary | ICD-10-CM | POA: Insufficient documentation

## 2021-06-15 DIAGNOSIS — Z950 Presence of cardiac pacemaker: Secondary | ICD-10-CM | POA: Insufficient documentation

## 2021-06-15 DIAGNOSIS — Z20822 Contact with and (suspected) exposure to covid-19: Secondary | ICD-10-CM | POA: Insufficient documentation

## 2021-06-15 DIAGNOSIS — Z7951 Long term (current) use of inhaled steroids: Secondary | ICD-10-CM | POA: Insufficient documentation

## 2021-06-15 DIAGNOSIS — Z7901 Long term (current) use of anticoagulants: Secondary | ICD-10-CM | POA: Insufficient documentation

## 2021-06-15 DIAGNOSIS — I499 Cardiac arrhythmia, unspecified: Secondary | ICD-10-CM | POA: Diagnosis not present

## 2021-06-15 DIAGNOSIS — R0603 Acute respiratory distress: Secondary | ICD-10-CM | POA: Diagnosis not present

## 2021-06-15 DIAGNOSIS — I2510 Atherosclerotic heart disease of native coronary artery without angina pectoris: Secondary | ICD-10-CM | POA: Insufficient documentation

## 2021-06-15 DIAGNOSIS — E039 Hypothyroidism, unspecified: Secondary | ICD-10-CM | POA: Insufficient documentation

## 2021-06-15 DIAGNOSIS — I469 Cardiac arrest, cause unspecified: Secondary | ICD-10-CM

## 2021-06-15 DIAGNOSIS — I110 Hypertensive heart disease with heart failure: Secondary | ICD-10-CM | POA: Insufficient documentation

## 2021-06-15 DIAGNOSIS — Z85118 Personal history of other malignant neoplasm of bronchus and lung: Secondary | ICD-10-CM | POA: Insufficient documentation

## 2021-06-15 DIAGNOSIS — J441 Chronic obstructive pulmonary disease with (acute) exacerbation: Secondary | ICD-10-CM | POA: Insufficient documentation

## 2021-06-15 DIAGNOSIS — Z87891 Personal history of nicotine dependence: Secondary | ICD-10-CM | POA: Insufficient documentation

## 2021-06-15 DIAGNOSIS — R0902 Hypoxemia: Secondary | ICD-10-CM | POA: Diagnosis not present

## 2021-06-15 DIAGNOSIS — Z79899 Other long term (current) drug therapy: Secondary | ICD-10-CM | POA: Insufficient documentation

## 2021-06-15 DIAGNOSIS — R0689 Other abnormalities of breathing: Secondary | ICD-10-CM | POA: Diagnosis not present

## 2021-06-15 DIAGNOSIS — E8729 Other acidosis: Secondary | ICD-10-CM | POA: Diagnosis not present

## 2021-06-15 DIAGNOSIS — R739 Hyperglycemia, unspecified: Secondary | ICD-10-CM | POA: Diagnosis not present

## 2021-06-15 DIAGNOSIS — R404 Transient alteration of awareness: Secondary | ICD-10-CM | POA: Diagnosis not present

## 2021-06-15 LAB — CBC WITH DIFFERENTIAL/PLATELET
Abs Immature Granulocytes: 0.14 10*3/uL — ABNORMAL HIGH (ref 0.00–0.07)
Basophils Absolute: 0.1 10*3/uL (ref 0.0–0.1)
Basophils Relative: 0 %
Eosinophils Absolute: 0 10*3/uL (ref 0.0–0.5)
Eosinophils Relative: 0 %
HCT: 38 % (ref 36.0–46.0)
Hemoglobin: 10.8 g/dL — ABNORMAL LOW (ref 12.0–15.0)
Immature Granulocytes: 1 %
Lymphocytes Relative: 4 %
Lymphs Abs: 0.5 10*3/uL — ABNORMAL LOW (ref 0.7–4.0)
MCH: 26.8 pg (ref 26.0–34.0)
MCHC: 28.4 g/dL — ABNORMAL LOW (ref 30.0–36.0)
MCV: 94.3 fL (ref 80.0–100.0)
Monocytes Absolute: 1.2 10*3/uL — ABNORMAL HIGH (ref 0.1–1.0)
Monocytes Relative: 9 %
Neutro Abs: 11.8 10*3/uL — ABNORMAL HIGH (ref 1.7–7.7)
Neutrophils Relative %: 86 %
Platelets: 169 10*3/uL (ref 150–400)
RBC: 4.03 MIL/uL (ref 3.87–5.11)
RDW: 17 % — ABNORMAL HIGH (ref 11.5–15.5)
WBC: 13.8 10*3/uL — ABNORMAL HIGH (ref 4.0–10.5)
nRBC: 0 % (ref 0.0–0.2)

## 2021-06-15 LAB — COMPREHENSIVE METABOLIC PANEL
ALT: 17 U/L (ref 0–44)
AST: 26 U/L (ref 15–41)
Albumin: 3.7 g/dL (ref 3.5–5.0)
Alkaline Phosphatase: 62 U/L (ref 38–126)
Anion gap: 9 (ref 5–15)
BUN: 22 mg/dL (ref 8–23)
CO2: 31 mmol/L (ref 22–32)
Calcium: 8.8 mg/dL — ABNORMAL LOW (ref 8.9–10.3)
Chloride: 100 mmol/L (ref 98–111)
Creatinine, Ser: 1.13 mg/dL — ABNORMAL HIGH (ref 0.44–1.00)
GFR, Estimated: 48 mL/min — ABNORMAL LOW (ref >60)
Glucose, Bld: 113 mg/dL — ABNORMAL HIGH (ref 70–99)
Potassium: 3.9 mmol/L (ref 3.5–5.1)
Sodium: 140 mmol/L (ref 135–145)
Total Bilirubin: 0.6 mg/dL (ref 0.3–1.2)
Total Protein: 6.8 g/dL (ref 6.5–8.1)

## 2021-06-15 LAB — I-STAT CHEM 8, ED
BUN: 27 mg/dL — ABNORMAL HIGH (ref 8–23)
Calcium, Ion: 1.06 mmol/L — ABNORMAL LOW (ref 1.15–1.40)
Chloride: 104 mmol/L (ref 98–111)
Creatinine, Ser: 1.4 mg/dL — ABNORMAL HIGH (ref 0.44–1.00)
Glucose, Bld: 218 mg/dL — ABNORMAL HIGH (ref 70–99)
HCT: 34 % — ABNORMAL LOW (ref 36.0–46.0)
Hemoglobin: 11.6 g/dL — ABNORMAL LOW (ref 12.0–15.0)
Potassium: 4.3 mmol/L (ref 3.5–5.1)
Sodium: 140 mmol/L (ref 135–145)
TCO2: 25 mmol/L (ref 22–32)

## 2021-06-15 LAB — RESP PANEL BY RT-PCR (FLU A&B, COVID) ARPGX2
Influenza A by PCR: NEGATIVE
Influenza A by PCR: NEGATIVE
Influenza B by PCR: NEGATIVE
Influenza B by PCR: NEGATIVE
SARS Coronavirus 2 by RT PCR: NEGATIVE
SARS Coronavirus 2 by RT PCR: NEGATIVE

## 2021-06-15 LAB — I-STAT VENOUS BLOOD GAS, ED
Acid-base deficit: 10 mmol/L — ABNORMAL HIGH (ref 0.0–2.0)
Bicarbonate: 22.6 mmol/L (ref 20.0–28.0)
Calcium, Ion: 1.06 mmol/L — ABNORMAL LOW (ref 1.15–1.40)
HCT: 33 % — ABNORMAL LOW (ref 36.0–46.0)
Hemoglobin: 11.2 g/dL — ABNORMAL LOW (ref 12.0–15.0)
O2 Saturation: 58 %
Potassium: 4.4 mmol/L (ref 3.5–5.1)
Sodium: 140 mmol/L (ref 135–145)
TCO2: 25 mmol/L (ref 22–32)
pCO2, Ven: 94.5 mmHg (ref 44.0–60.0)
pH, Ven: 6.987 — CL (ref 7.250–7.430)
pO2, Ven: 48 mmHg — ABNORMAL HIGH (ref 32.0–45.0)

## 2021-06-15 MED ORDER — HYDROCORTISONE SOD SUC (PF) 100 MG IJ SOLR: 100.0000 mg | Freq: Once | INTRAMUSCULAR | Status: AC

## 2021-06-15 MED ORDER — NOREPINEPHRINE 4 MG/250ML-% IV SOLN
INTRAVENOUS | Status: AC
  Filled 2021-06-15: qty 250

## 2021-06-15 MED ORDER — EPINEPHRINE 1 MG/10ML IJ SOSY
PREFILLED_SYRINGE | INTRAMUSCULAR | Status: AC | PRN
  Administered 2021-06-15 (×2): 1 mg via INTRAVENOUS

## 2021-06-15 MED ORDER — SODIUM BICARBONATE 8.4 % IV SOLN
INTRAVENOUS | Status: AC | PRN
Start: 2021-06-15 — End: 2021-06-15
  Administered 2021-06-15 (×2): 50 meq via INTRAVENOUS

## 2021-06-15 MED ORDER — HYDROCORTISONE SOD SUC (PF) 100 MG IJ SOLR
INTRAMUSCULAR | Status: AC
  Administered 2021-06-15: 100 mg
  Filled 2021-06-15: qty 2

## 2021-06-15 MED ORDER — EPINEPHRINE HCL 5 MG/250ML IV SOLN IN NS
INTRAVENOUS | Status: AC
  Administered 2021-06-15: 5 mg
  Filled 2021-06-15: qty 250

## 2021-06-15 MED ORDER — NOREPINEPHRINE 4 MG/250ML-% IV SOLN
INTRAVENOUS | Status: AC | PRN
  Administered 2021-06-15: 20 ug/min via INTRAVENOUS

## 2021-06-15 MED ORDER — SODIUM CHLORIDE 0.9 % IV BOLUS
1000.0000 mL | Freq: Once | INTRAVENOUS | Status: AC
  Administered 2021-06-15: 1000 mL via INTRAVENOUS

## 2021-06-15 MED ORDER — EPINEPHRINE 0.1 MG/10ML (10 MCG/ML) SYRINGE FOR IV PUSH (FOR BLOOD PRESSURE SUPPORT)
PREFILLED_SYRINGE | INTRAVENOUS | Status: AC | PRN
  Administered 2021-06-15: 5 ug via INTRAVENOUS

## 2021-06-16 NOTE — Code Documentation (Signed)
Epi rate increased to 20

## 2021-06-16 NOTE — Code Documentation (Signed)
Family updated as to patient's status by MD

## 2021-06-16 NOTE — ED Notes (Signed)
Chaplain in family room with family

## 2021-06-16 NOTE — Code Documentation (Signed)
Family at beside. Family given emotional support. 

## 2021-06-16 NOTE — Code Documentation (Signed)
Epi rate increased to 10

## 2021-06-16 NOTE — ED Notes (Signed)
Family requesting [employer]
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
## 2021-06-16 NOTE — Code Documentation (Signed)
[place] Called:  Referral Number: [id number] Spokesperson: Page

## 2021-06-16 NOTE — Progress Notes (Signed)
I responded to a page from the nurse to provide spiritual care for the patient's family. I arrived at the consultation room where the patient's sisters were present. I shared words of comfort, led in prayer, and provided spiritual care through pastoral presence.    2021/06/17 2518  Clinical Encounter Type  Visited With Family  Visit Type Spiritual support;Death  Referral From Nurse  Consult/Referral To Chaplain  Spiritual Encounters  Spiritual Needs Prayer;Emotional;Grief support    Chaplain Dr Redgie Grayer

## 2021-06-16 NOTE — Code Documentation (Signed)
Patient time of death occurred at 0602.

## 2021-06-16 NOTE — Code Documentation (Signed)
Epi rate increased to 15
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
## 2021-06-16 NOTE — Code Documentation (Signed)
Epi rate increased to 30

## 2021-06-16 NOTE — Code Documentation (Signed)
Family updated as to patient's status.

## 2021-06-16 DEATH — deceased

## 2021-07-01 ENCOUNTER — Ambulatory Visit: Payer: PPO | Admitting: Internal Medicine

## 2021-07-15 ENCOUNTER — Telehealth: Payer: Self-pay | Admitting: Internal Medicine

## 2021-07-15 NOTE — Telephone Encounter (Signed)
Rep w/ [employer] requesting provider's signature on death certificate

## 2021-07-27 ENCOUNTER — Telehealth: Payer: PPO

## 2021-08-19 ENCOUNTER — Ambulatory Visit: Payer: PPO | Admitting: Adult Health

## 2021-09-14 NOTE — Progress Notes (Signed)
No Show

## 2021-11-18 ENCOUNTER — Other Ambulatory Visit: Payer: PPO

## 2021-11-22 ENCOUNTER — Ambulatory Visit: Payer: PPO | Admitting: Internal Medicine

## 2021-11-24 ENCOUNTER — Ambulatory Visit: Payer: PPO | Admitting: Adult Health

## 2022-05-26 LAB — METHYLMALONIC ACID, SERUM: Methylmalonic Acid, Quantitative: 378 nmol/L (ref 0–378)
